# Patient Record
Sex: Male | Born: 1950 | Race: White | Hispanic: No | Marital: Married | Smoking: Former smoker
Health system: Southern US, Community
[De-identification: ages and names within clinical notes are randomized; demographics above are authoritative.]

## PROBLEM LIST (undated history)

## (undated) DIAGNOSIS — I10 Essential (primary) hypertension: Secondary | ICD-10-CM

## (undated) DIAGNOSIS — R569 Unspecified convulsions: Secondary | ICD-10-CM

## (undated) DIAGNOSIS — Z86010 Personal history of colonic polyps: Secondary | ICD-10-CM

## (undated) DIAGNOSIS — I4891 Unspecified atrial fibrillation: Secondary | ICD-10-CM

## (undated) DIAGNOSIS — F419 Anxiety disorder, unspecified: Secondary | ICD-10-CM

## (undated) DIAGNOSIS — S22000A Wedge compression fracture of unspecified thoracic vertebra, initial encounter for closed fracture: Secondary | ICD-10-CM

## (undated) DIAGNOSIS — I639 Cerebral infarction, unspecified: Secondary | ICD-10-CM

## (undated) DIAGNOSIS — A419 Sepsis, unspecified organism: Secondary | ICD-10-CM

## (undated) DIAGNOSIS — J189 Pneumonia, unspecified organism: Secondary | ICD-10-CM

## (undated) DIAGNOSIS — E119 Type 2 diabetes mellitus without complications: Secondary | ICD-10-CM

## (undated) DIAGNOSIS — Z9289 Personal history of other medical treatment: Secondary | ICD-10-CM

## (undated) DIAGNOSIS — N322 Vesical fistula, not elsewhere classified: Secondary | ICD-10-CM

## (undated) DIAGNOSIS — I5032 Chronic diastolic (congestive) heart failure: Secondary | ICD-10-CM

## (undated) DIAGNOSIS — N390 Urinary tract infection, site not specified: Secondary | ICD-10-CM

## (undated) DIAGNOSIS — C801 Malignant (primary) neoplasm, unspecified: Secondary | ICD-10-CM

## (undated) DIAGNOSIS — R351 Nocturia: Secondary | ICD-10-CM

## (undated) DIAGNOSIS — F10239 Alcohol dependence with withdrawal, unspecified: Secondary | ICD-10-CM

## (undated) DIAGNOSIS — D229 Melanocytic nevi, unspecified: Secondary | ICD-10-CM

## (undated) DIAGNOSIS — N189 Chronic kidney disease, unspecified: Secondary | ICD-10-CM

## (undated) HISTORY — DX: Personal history of other medical treatment: Z92.89

## (undated) HISTORY — DX: Personal history of colonic polyps: Z86.010

## (undated) HISTORY — DX: Pneumonia, unspecified organism: J18.9

## (undated) HISTORY — DX: Wedge compression fracture of unspecified thoracic vertebra, initial encounter for closed fracture: S22.000A

## (undated) HISTORY — DX: Unspecified convulsions: R56.9

## (undated) HISTORY — PX: OTHER SURGICAL HISTORY: SHX169

## (undated) HISTORY — DX: Alcohol dependence with withdrawal, unspecified: F10.239

## (undated) HISTORY — DX: Chronic diastolic (congestive) heart failure: I50.32

## (undated) HISTORY — PX: COLON SURGERY: SHX602

---
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
## 2005-02-08 ENCOUNTER — Emergency Department (HOSPITAL_COMMUNITY): Admission: EM | Admit: 2005-02-08 | Discharge: 2005-02-08 | Payer: Self-pay | Admitting: Emergency Medicine

## 2008-05-13 DIAGNOSIS — D229 Melanocytic nevi, unspecified: Secondary | ICD-10-CM

## 2008-05-13 HISTORY — DX: Melanocytic nevi, unspecified: D22.9

## 2010-08-20 DIAGNOSIS — C801 Malignant (primary) neoplasm, unspecified: Secondary | ICD-10-CM

## 2010-08-20 HISTORY — PX: LIPOMA EXCISION: SHX5283

## 2010-08-20 HISTORY — DX: Malignant (primary) neoplasm, unspecified: C80.1

## 2010-08-23 ENCOUNTER — Ambulatory Visit
Admission: RE | Admit: 2010-08-23 | Discharge: 2010-08-23 | Payer: Self-pay | Source: Home / Self Care | Attending: General Surgery | Admitting: General Surgery

## 2010-08-23 LAB — GLUCOSE, CAPILLARY
Glucose-Capillary: 143 mg/dL — ABNORMAL HIGH (ref 70–99)
Glucose-Capillary: 130 mg/dL — ABNORMAL HIGH (ref 70–99)

## 2010-10-30 LAB — BASIC METABOLIC PANEL
BUN: 9 mg/dL (ref 6–23)
CO2: 29 mEq/L (ref 19–32)
Calcium: 9.2 mg/dL (ref 8.4–10.5)
Chloride: 99 mEq/L (ref 96–112)
Creatinine, Ser: 0.85 mg/dL (ref 0.4–1.5)
GFR calc Af Amer: >60 mL/min (ref >60)
GFR calc non Af Amer: >60 mL/min (ref >60)
Glucose, Bld: 163 mg/dL — ABNORMAL HIGH (ref 70–99)
Potassium: 3.9 mEq/L (ref 3.5–5.1)
Sodium: 136 mEq/L (ref 135–145)

## 2010-10-30 LAB — DIFFERENTIAL
Basophils Absolute: 0.1 10*3/uL (ref 0.0–0.1)
Basophils Relative: 1 % (ref 0–1)
Eosinophils Absolute: 0.2 10*3/uL (ref 0.0–0.7)
Eosinophils Relative: 2 % (ref 0–5)
Lymphocytes Relative: 23 % (ref 12–46)
Lymphs Abs: 2.4 10*3/uL (ref 0.7–4.0)
Monocytes Absolute: 0.9 10*3/uL (ref 0.1–1.0)
Monocytes Relative: 9 % (ref 3–12)
Neutro Abs: 6.8 10*3/uL (ref 1.7–7.7)
Neutrophils Relative %: 66 % (ref 43–77)

## 2010-10-30 LAB — CBC
HCT: 58 % — ABNORMAL HIGH (ref 39.0–52.0)
Hemoglobin: 20.6 g/dL — ABNORMAL HIGH (ref 13.0–17.0)
MCH: 36.5 pg — ABNORMAL HIGH (ref 26.0–34.0)
MCHC: 35.5 g/dL (ref 30.0–36.0)
MCV: 102.8 fL — ABNORMAL HIGH (ref 78.0–100.0)
Platelets: 189 10*3/uL (ref 150–400)
RBC: 5.64 MIL/uL (ref 4.22–5.81)
RDW: 13 % (ref 11.5–15.5)
WBC: 10.4 10*3/uL (ref 4.0–10.5)

## 2010-12-28 ENCOUNTER — Ambulatory Visit: Payer: BC Managed Care – PPO | Attending: Radiation Oncology | Admitting: Radiation Oncology

## 2010-12-28 DIAGNOSIS — Z79899 Other long term (current) drug therapy: Secondary | ICD-10-CM | POA: Insufficient documentation

## 2010-12-28 DIAGNOSIS — C61 Malignant neoplasm of prostate: Secondary | ICD-10-CM | POA: Insufficient documentation

## 2010-12-28 DIAGNOSIS — E119 Type 2 diabetes mellitus without complications: Secondary | ICD-10-CM | POA: Insufficient documentation

## 2011-01-26 ENCOUNTER — Other Ambulatory Visit: Payer: Self-pay | Admitting: Urology

## 2011-01-26 ENCOUNTER — Encounter (HOSPITAL_COMMUNITY): Payer: BC Managed Care – PPO

## 2011-01-26 ENCOUNTER — Other Ambulatory Visit (HOSPITAL_COMMUNITY): Payer: Self-pay | Admitting: Urology

## 2011-01-26 ENCOUNTER — Ambulatory Visit (HOSPITAL_COMMUNITY)
Admission: RE | Admit: 2011-01-26 | Discharge: 2011-01-26 | Disposition: A | Discharge status: Home / Self Care | Payer: BC Managed Care – PPO | Source: Ambulatory Visit | Attending: Urology | Admitting: Urology

## 2011-01-26 DIAGNOSIS — I10 Essential (primary) hypertension: Secondary | ICD-10-CM

## 2011-01-26 DIAGNOSIS — Z01818 Encounter for other preprocedural examination: Secondary | ICD-10-CM | POA: Insufficient documentation

## 2011-01-26 DIAGNOSIS — Z01812 Encounter for preprocedural laboratory examination: Secondary | ICD-10-CM | POA: Insufficient documentation

## 2011-01-26 DIAGNOSIS — Z87891 Personal history of nicotine dependence: Secondary | ICD-10-CM | POA: Insufficient documentation

## 2011-01-26 DIAGNOSIS — C61 Malignant neoplasm of prostate: Secondary | ICD-10-CM

## 2011-01-26 LAB — CBC
HCT: 50.7 % (ref 39.0–52.0)
Hemoglobin: 18.6 g/dL — ABNORMAL HIGH (ref 13.0–17.0)
MCH: 36.2 pg — ABNORMAL HIGH (ref 26.0–34.0)
MCHC: 36.7 g/dL — ABNORMAL HIGH (ref 30.0–36.0)
MCV: 98.6 fL (ref 78.0–100.0)
Platelets: 228 10*3/uL (ref 150–400)
RBC: 5.14 MIL/uL (ref 4.22–5.81)
RDW: 11.6 % (ref 11.5–15.5)
WBC: 8.6 10*3/uL (ref 4.0–10.5)

## 2011-01-26 LAB — BASIC METABOLIC PANEL
BUN: 6 mg/dL (ref 6–23)
CO2: 28 mEq/L (ref 19–32)
Calcium: 9.3 mg/dL (ref 8.4–10.5)
Chloride: 89 mEq/L — ABNORMAL LOW (ref 96–112)
Creatinine, Ser: 0.68 mg/dL (ref 0.4–1.5)
GFR calc Af Amer: >60 mL/min (ref >60)
GFR calc non Af Amer: >60 mL/min (ref >60)
Glucose, Bld: 145 mg/dL — ABNORMAL HIGH (ref 70–99)
Potassium: 5.1 mEq/L (ref 3.5–5.1)
Sodium: 126 mEq/L — ABNORMAL LOW (ref 135–145)

## 2011-01-26 LAB — SURGICAL PCR SCREEN
MRSA, PCR: NEGATIVE
Staphylococcus aureus: NEGATIVE

## 2011-01-31 ENCOUNTER — Other Ambulatory Visit: Payer: Self-pay | Admitting: Urology

## 2011-01-31 ENCOUNTER — Inpatient Hospital Stay (HOSPITAL_COMMUNITY)
Admission: RE | Admit: 2011-01-31 | Discharge: 2011-02-01 | DRG: 335 | Disposition: A | Discharge status: Home / Self Care | Payer: BC Managed Care – PPO | Source: Ambulatory Visit | Attending: Urology | Admitting: Urology

## 2011-01-31 DIAGNOSIS — C61 Malignant neoplasm of prostate: Principal | ICD-10-CM | POA: Diagnosis present

## 2011-01-31 DIAGNOSIS — E119 Type 2 diabetes mellitus without complications: Secondary | ICD-10-CM | POA: Diagnosis present

## 2011-01-31 DIAGNOSIS — I10 Essential (primary) hypertension: Secondary | ICD-10-CM | POA: Diagnosis present

## 2011-01-31 DIAGNOSIS — G8918 Other acute postprocedural pain: Secondary | ICD-10-CM | POA: Diagnosis not present

## 2011-01-31 DIAGNOSIS — Z01812 Encounter for preprocedural laboratory examination: Secondary | ICD-10-CM

## 2011-01-31 DIAGNOSIS — F17200 Nicotine dependence, unspecified, uncomplicated: Secondary | ICD-10-CM | POA: Diagnosis present

## 2011-01-31 HISTORY — PX: ROBOT ASSISTED LAPAROSCOPIC RADICAL PROSTATECTOMY: SHX5141

## 2011-01-31 LAB — ABO/RH: ABO/RH(D): O POS

## 2011-01-31 LAB — BASIC METABOLIC PANEL
BUN: 7 mg/dL (ref 6–23)
CO2: 24 mEq/L (ref 19–32)
Calcium: 8.2 mg/dL — ABNORMAL LOW (ref 8.4–10.5)
Chloride: 100 mEq/L (ref 96–112)
Creatinine, Ser: 0.75 mg/dL (ref 0.4–1.5)
GFR calc Af Amer: >60 mL/min (ref >60)
GFR calc non Af Amer: >60 mL/min (ref >60)
Glucose, Bld: 162 mg/dL — ABNORMAL HIGH (ref 70–99)
Potassium: 4.3 mEq/L (ref 3.5–5.1)
Sodium: 133 mEq/L — ABNORMAL LOW (ref 135–145)

## 2011-01-31 LAB — TYPE AND SCREEN
ABO/RH(D): O POS
Antibody Screen: NEGATIVE

## 2011-01-31 LAB — GLUCOSE, CAPILLARY
Glucose-Capillary: 149 mg/dL — ABNORMAL HIGH (ref 70–99)
Glucose-Capillary: 174 mg/dL — ABNORMAL HIGH (ref 70–99)
Glucose-Capillary: 198 mg/dL — ABNORMAL HIGH (ref 70–99)
Glucose-Capillary: 177 mg/dL — ABNORMAL HIGH (ref 70–99)

## 2011-01-31 LAB — CBC
HCT: 45.3 % (ref 39.0–52.0)
Hemoglobin: 15.9 g/dL (ref 13.0–17.0)
MCH: 35.3 pg — ABNORMAL HIGH (ref 26.0–34.0)
MCHC: 35.1 g/dL (ref 30.0–36.0)
MCV: 100.4 fL — ABNORMAL HIGH (ref 78.0–100.0)
Platelets: 206 10*3/uL (ref 150–400)
RBC: 4.51 MIL/uL (ref 4.22–5.81)
RDW: 11.5 % (ref 11.5–15.5)
WBC: 18.7 10*3/uL — ABNORMAL HIGH (ref 4.0–10.5)

## 2011-02-01 LAB — BASIC METABOLIC PANEL
BUN: 6 mg/dL (ref 6–23)
CO2: 28 mEq/L (ref 19–32)
Calcium: 7.7 mg/dL — ABNORMAL LOW (ref 8.4–10.5)
Chloride: 102 mEq/L (ref 96–112)
Creatinine, Ser: 0.67 mg/dL (ref 0.4–1.5)
GFR calc Af Amer: >60 mL/min (ref >60)
GFR calc non Af Amer: >60 mL/min (ref >60)
Glucose, Bld: 124 mg/dL — ABNORMAL HIGH (ref 70–99)
Potassium: 4.8 mEq/L (ref 3.5–5.1)
Sodium: 134 mEq/L — ABNORMAL LOW (ref 135–145)

## 2011-02-01 LAB — DIFFERENTIAL
Basophils Absolute: 0 10*3/uL (ref 0.0–0.1)
Basophils Relative: 0 % (ref 0–1)
Eosinophils Absolute: 0.1 10*3/uL (ref 0.0–0.7)
Eosinophils Relative: 1 % (ref 0–5)
Lymphocytes Relative: 17 % (ref 12–46)
Lymphs Abs: 2 10*3/uL (ref 0.7–4.0)
Monocytes Absolute: 1.1 10*3/uL — ABNORMAL HIGH (ref 0.1–1.0)
Monocytes Relative: 9 % (ref 3–12)
Neutro Abs: 8.5 10*3/uL — ABNORMAL HIGH (ref 1.7–7.7)
Neutrophils Relative %: 73 % (ref 43–77)

## 2011-02-01 LAB — GLUCOSE, CAPILLARY
Glucose-Capillary: 100 mg/dL — ABNORMAL HIGH (ref 70–99)
Glucose-Capillary: 132 mg/dL — ABNORMAL HIGH (ref 70–99)
Glucose-Capillary: 162 mg/dL — ABNORMAL HIGH (ref 70–99)
Glucose-Capillary: 199 mg/dL — ABNORMAL HIGH (ref 70–99)

## 2011-02-01 LAB — CBC
HCT: 40.1 % (ref 39.0–52.0)
Hemoglobin: 13.8 g/dL (ref 13.0–17.0)
MCH: 35.2 pg — ABNORMAL HIGH (ref 26.0–34.0)
MCHC: 34.4 g/dL (ref 30.0–36.0)
MCV: 102.3 fL — ABNORMAL HIGH (ref 78.0–100.0)
Platelets: 194 10*3/uL (ref 150–400)
RBC: 3.92 MIL/uL — ABNORMAL LOW (ref 4.22–5.81)
RDW: 11.6 % (ref 11.5–15.5)
WBC: 11.7 10*3/uL — ABNORMAL HIGH (ref 4.0–10.5)

## 2011-03-01 NOTE — Op Note (Signed)
NAME:  Douglas Ballard, POSA NO.:  [id number]  MEDICAL RECORD NO.:  [id number]  LOCATION:  [id number]                         FACILITY:  [facility]  PHYSICIAN:  Bernestine Amass, M.D.  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  PROCEDURE PERFORMED:  Robotic-assisted laparoscopic radical retropubic prostatectomy with bilateral pelvic lymph node dissections.  SURGEON:  Bernestine Amass, M.D.  ASSISTANT:  Franco Collet, RN  ANESTHESIA:  General endotracheal.  INDICATIONS:  Douglas Ballard is 60 years of age.  The patient was sent to see me with a mildly elevated PSA of 4.6.  The patient had an ultrasound, which revealed nothing of concern and digital rectal exam was normal.  Prostate was 20 g.  The patient's right-sided biopsies were positive in 5/6 cores for moderate volume Gleason 3+4=7 cancer.  All left-sided biopsies were negative.  The patient underwent extensive counseling with regard to treatment options and chose robotic surgery. He appeared to understand the advantages, disadvantages, potential risk of the surgery.  He was felt to be primarily a candidate for unilateral nerve spare.  The patient has met with Physical Therapy preoperatively. He has done a mechanical bowel prep.  He has had placement of PAS compression boots and has received perioperative Unasyn.  TECHNIQUE AND FINDINGS:  The patient was brought to the operating room where he had successful induction of general endotracheal anesthesia. He was placed in the mid-to-low lithotomy position and carefully secured to the operative table.  All extremities were carefully padded.  The patient was then placed in a steep Trendelenburg  position and prepped and draped in the usual manner.  Appropriate surgical time-out was performed.  A Foley catheter was placed sterilely on the operative field.  Initial camera port incision was chosen 18 cm above the pubic symphysis just left of the umbilicus.  A standard open Hasson technique was utilized and placement of the initial 12 mm trocar and abdominal insufflation occurred without incident.  Additional trocars were placed with direct visual guidance including additional 5 and 12 mm assist ports and three 8 mm robotic trocars.  Once all trocars were placed, the surgical cart was docked.  The bladder was filled and the space of Retzius was developed with blunt and sharp dissection technique.  A small amount of superficial fat over the endopelvic fascia and bladder neck was taken with electrocautery scissors.  The endopelvic fascia was then opened widely from base to apex of the prostate and the levator musculature swept off the apex of the prostate isolating the dorsal venous complex, which was then stapled with an ETS stapling device with excellent hemostasis.  A Foley balloon catheter was used to identify the anterior bladder neck, which was then transected down to catheter with electrocautery scissors. The catheter was then retracted anteriorly.  Indigo carmine was given and we were well away from the ureteral orifices.  The bladder neck incision was then carried down to the posterior portion  of the bladder. This was then carried down to the vas deferens and seminal vesicles, which were identified individually and freed.  With anterior retraction, the posterior plane between the rectum and the prostate was easily established primarily with blunt dissection technique.  Again, the patient was felt to be a candidate only for unilateral nerve spare, given his pathology.  The patient's tumor was all located on the right where we chose to do a wide excision of his bundle.  On  the left side, superficial fascia anteriorly was loosely transected and then swept posterolaterally until we were able to establish a nice groove between the neurovascular bundle on the lateral aspect of the prostate. The screw was then further established.  Pedicles of the prostate were taken down and again on the right side, the EnSeal device was used widely to excise as much tissue as possible along the posterolateral aspect of the prostate.  The anterior urethra was then transected and the catheter was retracted.  Posterior urethra was then transected and once some rectourethralis muscle was sharply incised, the prostate specimen was removed from the hemipelvis.  Irrigation was then performed and rectal insufflation revealed no evidence of rectal injury.  Hemostasis remained excellent.  Attention was then turned toward bilateral pelvic lymph node dissections. Obturator nodes extending to the bifurcation of the iliac artery were taken bilaterally.  Clips were used for small venous channels and lymphatics.  Obturator nerves identified bilaterally and node packet sent separately for permanent analysis only.  Attention was then turned to reconstruction.  The bladder neck was small and required no closure.  Indigo carmine was given and we were again well away from the ureteral orifices.  The posterior urethral stump and posterior bladder neck were reapproximated with an interrupted Vicryl suture at the 6 o'clock position.  The rest of the anastomosis was done with a double-armed 3-0 Monocryl suture in a 360-degree manner.  A new catheter was placed without difficulty and irrigation revealed no evidence of any leakage.  A pelvic drain was placed through one of the robotic trocars and secured to the skin.  The prostate specimen was placed in an Endopouch retrieval bag.  The 12 mm trocar site was closed with a Vicryl suture with the aid of a suture passer and all other trocars were taken  out with direct visual guidance.  The camera port incision was extended slightly to allow removal of the specimen and then closed with a running Vicryl suture.  All port sites and incisions were infiltrated with Marcaine and closed with clips.  The bladder continued to irrigate blue urine from the indigo carmine.  The patient's blood loss was 50 cc.  The patient had no obvious complications or problems and was brought to recovery room in stable condition.     Bernestine Amass, M.D.     DSG/MEDQ  D:  02/01/2011  T:  02/01/2011  Job:  [id number]  Electronically Signed by Rana Snare M.D. on 03/01/2011 05:44:08 PM

## 2011-06-22 ENCOUNTER — Ambulatory Visit
Admission: RE | Admit: 2011-06-22 | Discharge: 2011-06-22 | Disposition: A | Discharge status: Home / Self Care | Payer: BC Managed Care – PPO | Source: Ambulatory Visit | Attending: Family Medicine | Admitting: Family Medicine

## 2011-06-22 ENCOUNTER — Other Ambulatory Visit: Payer: Self-pay | Admitting: Family Medicine

## 2011-06-26 ENCOUNTER — Other Ambulatory Visit: Payer: Self-pay

## 2011-06-26 ENCOUNTER — Emergency Department (HOSPITAL_COMMUNITY): Payer: BC Managed Care – PPO

## 2011-06-26 ENCOUNTER — Inpatient Hospital Stay (HOSPITAL_COMMUNITY)
Admission: EM | Admit: 2011-06-26 | Discharge: 2011-07-02 | DRG: 560 | Disposition: A | Discharge status: Home / Self Care | Payer: BC Managed Care – PPO | Attending: Internal Medicine | Admitting: Internal Medicine

## 2011-06-26 DIAGNOSIS — E1159 Type 2 diabetes mellitus with other circulatory complications: Secondary | ICD-10-CM | POA: Diagnosis present

## 2011-06-26 DIAGNOSIS — Y92009 Unspecified place in unspecified non-institutional (private) residence as the place of occurrence of the external cause: Secondary | ICD-10-CM

## 2011-06-26 DIAGNOSIS — R52 Pain, unspecified: Secondary | ICD-10-CM | POA: Diagnosis present

## 2011-06-26 DIAGNOSIS — J189 Pneumonia, unspecified organism: Secondary | ICD-10-CM

## 2011-06-26 DIAGNOSIS — R569 Unspecified convulsions: Secondary | ICD-10-CM | POA: Diagnosis present

## 2011-06-26 DIAGNOSIS — E871 Hypo-osmolality and hyponatremia: Secondary | ICD-10-CM | POA: Diagnosis present

## 2011-06-26 DIAGNOSIS — I10 Essential (primary) hypertension: Secondary | ICD-10-CM

## 2011-06-26 DIAGNOSIS — F1010 Alcohol abuse, uncomplicated: Secondary | ICD-10-CM

## 2011-06-26 DIAGNOSIS — S22000A Wedge compression fracture of unspecified thoracic vertebra, initial encounter for closed fracture: Secondary | ICD-10-CM

## 2011-06-26 DIAGNOSIS — F10231 Alcohol dependence with withdrawal delirium: Secondary | ICD-10-CM | POA: Diagnosis not present

## 2011-06-26 DIAGNOSIS — D72829 Elevated white blood cell count, unspecified: Secondary | ICD-10-CM

## 2011-06-26 DIAGNOSIS — Y998 Other external cause status: Secondary | ICD-10-CM

## 2011-06-26 DIAGNOSIS — S22009A Unspecified fracture of unspecified thoracic vertebra, initial encounter for closed fracture: Principal | ICD-10-CM | POA: Diagnosis present

## 2011-06-26 DIAGNOSIS — F17200 Nicotine dependence, unspecified, uncomplicated: Secondary | ICD-10-CM | POA: Diagnosis present

## 2011-06-26 DIAGNOSIS — Z720 Tobacco use: Secondary | ICD-10-CM

## 2011-06-26 DIAGNOSIS — F10939 Alcohol use, unspecified with withdrawal, unspecified: Secondary | ICD-10-CM

## 2011-06-26 DIAGNOSIS — W19XXXA Unspecified fall, initial encounter: Secondary | ICD-10-CM | POA: Diagnosis present

## 2011-06-26 DIAGNOSIS — F1020 Alcohol dependence, uncomplicated: Secondary | ICD-10-CM | POA: Diagnosis present

## 2011-06-26 DIAGNOSIS — F10931 Alcohol use, unspecified with withdrawal delirium: Secondary | ICD-10-CM | POA: Diagnosis not present

## 2011-06-26 DIAGNOSIS — Z8546 Personal history of malignant neoplasm of prostate: Secondary | ICD-10-CM

## 2011-06-26 DIAGNOSIS — F10239 Alcohol dependence with withdrawal, unspecified: Secondary | ICD-10-CM

## 2011-06-26 HISTORY — DX: Essential (primary) hypertension: I10

## 2011-06-26 HISTORY — DX: Wedge compression fracture of unspecified thoracic vertebra, initial encounter for closed fracture: S22.000A

## 2011-06-26 HISTORY — DX: Malignant (primary) neoplasm, unspecified: C80.1

## 2011-06-26 HISTORY — DX: Pneumonia, unspecified organism: J18.9

## 2011-06-26 LAB — POCT I-STAT, CHEM 8
BUN: 17 mg/dL (ref 6–23)
Calcium, Ion: 1.03 mmol/L — ABNORMAL LOW (ref 1.12–1.32)
Chloride: 88 mEq/L — ABNORMAL LOW (ref 96–112)
Creatinine, Ser: 1.1 mg/dL (ref 0.50–1.35)
Glucose, Bld: 213 mg/dL — ABNORMAL HIGH (ref 70–99)
HCT: 41 % (ref 39.0–52.0)
Hemoglobin: 13.9 g/dL (ref 13.0–17.0)
Potassium: 4.1 mEq/L (ref 3.5–5.1)
Sodium: 121 mEq/L — ABNORMAL LOW (ref 135–145)
TCO2: 23 mmol/L (ref 0–100)

## 2011-06-26 LAB — ETHANOL: Alcohol, Ethyl (B): <11 mg/dL (ref 0–11)

## 2011-06-26 LAB — GLUCOSE, CAPILLARY: Glucose-Capillary: 154 mg/dL — ABNORMAL HIGH (ref 70–99)

## 2011-06-26 LAB — CBC
Hemoglobin: 13.9 g/dL (ref 13.0–17.0)
MCH: 33.8 pg (ref 26.0–34.0)
MCV: 91 fL (ref 78.0–100.0)
RBC: 4.11 MIL/uL — ABNORMAL LOW (ref 4.22–5.81)

## 2011-06-26 LAB — HEMOGLOBIN A1C: Hgb A1c MFr Bld: 6.8 % — ABNORMAL HIGH (ref <5.7)

## 2011-06-26 LAB — PROTIME-INR
INR: 1.04 (ref 0.00–1.49)
Prothrombin Time: 13.8 seconds (ref 11.6–15.2)

## 2011-06-26 MED ORDER — HYDROMORPHONE HCL PF 1 MG/ML IJ SOLN
1.0000 mg | Freq: Once | INTRAMUSCULAR | Status: AC
  Administered 2011-06-26: 1 mg via INTRAVENOUS

## 2011-06-26 MED ORDER — HYDROMORPHONE HCL PF 1 MG/ML IJ SOLN
2.0000 mg | INTRAMUSCULAR | Status: DC | PRN
  Administered 2011-06-26: 2 mg via INTRAVENOUS

## 2011-06-26 MED ORDER — FOLIC ACID 1 MG PO TABS
1.0000 mg | ORAL_TABLET | Freq: Every day | ORAL | Status: DC
  Administered 2011-06-27 – 2011-07-02 (×6): 1 mg via ORAL
  Filled 2011-06-26 (×8): qty 1

## 2011-06-26 MED ORDER — DIPHENHYDRAMINE HCL 50 MG/ML IJ SOLN
12.5000 mg | Freq: Four times a day (QID) | INTRAMUSCULAR | Status: DC | PRN
  Administered 2011-06-26 – 2011-06-27 (×2): 12.5 mg via INTRAVENOUS
  Filled 2011-06-26 (×3): qty 1

## 2011-06-26 MED ORDER — NALOXONE HCL 0.4 MG/ML IJ SOLN: 0.4000 mg | INTRAMUSCULAR | Status: DC | PRN

## 2011-06-26 MED ORDER — OMEGA-3-ACID ETHYL ESTERS 1 G PO CAPS
1.0000 g | ORAL_CAPSULE | Freq: Every day | ORAL | Status: DC
  Administered 2011-06-27 – 2011-07-02 (×6): 1 g via ORAL
  Filled 2011-06-26 (×8): qty 1

## 2011-06-26 MED ORDER — SODIUM CHLORIDE 0.9 % IV SOLN
INTRAVENOUS | Status: DC
  Administered 2011-06-27 – 2011-06-28 (×4): via INTRAVENOUS

## 2011-06-26 MED ORDER — HYDROMORPHONE HCL PF 2 MG/ML IJ SOLN: 2.0000 mg | Freq: Once | INTRAMUSCULAR | Status: DC

## 2011-06-26 MED ORDER — ONDANSETRON HCL 4 MG PO TABS: 4.0000 mg | ORAL_TABLET | Freq: Four times a day (QID) | ORAL | Status: DC | PRN

## 2011-06-26 MED ORDER — HYDROMORPHONE HCL PF 1 MG/ML IJ SOLN
1.0000 mg | INTRAMUSCULAR | Status: DC | PRN
  Administered 2011-06-26: 1 mg via INTRAVENOUS
  Filled 2011-06-26: qty 1

## 2011-06-26 MED ORDER — VITAMIN B-1 100 MG PO TABS
100.0000 mg | ORAL_TABLET | Freq: Every day | ORAL | Status: DC
  Administered 2011-06-27 – 2011-07-02 (×6): 100 mg via ORAL
  Filled 2011-06-26 (×8): qty 1

## 2011-06-26 MED ORDER — THERA M PLUS PO TABS
1.0000 | ORAL_TABLET | Freq: Every day | ORAL | Status: DC
  Administered 2011-06-27 – 2011-07-02 (×6): 1 via ORAL
  Filled 2011-06-26 (×8): qty 1

## 2011-06-26 MED ORDER — POLYETHYLENE GLYCOL 3350 17 G PO PACK
17.0000 g | PACK | Freq: Every day | ORAL | Status: DC
  Administered 2011-06-27 – 2011-07-02 (×6): 17 g via ORAL
  Filled 2011-06-26 (×8): qty 1

## 2011-06-26 MED ORDER — HYDROMORPHONE HCL PF 1 MG/ML IJ SOLN
INTRAMUSCULAR | Status: AC
  Filled 2011-06-26: qty 1

## 2011-06-26 MED ORDER — THIAMINE HCL 100 MG/ML IJ SOLN
100.0000 mg | Freq: Once | INTRAMUSCULAR | Status: AC
  Administered 2011-06-26: 100 mg via INTRAVENOUS
  Filled 2011-06-26: qty 2

## 2011-06-26 MED ORDER — DEXTROSE 5 % IV SOLN
1.0000 g | Freq: Once | INTRAVENOUS | Status: DC
  Filled 2011-06-26: qty 10

## 2011-06-26 MED ORDER — HYDROMORPHONE HCL PF 1 MG/ML IJ SOLN
INTRAMUSCULAR | Status: AC
  Administered 2011-06-26: 1 mg
  Filled 2011-06-26: qty 1

## 2011-06-26 MED ORDER — AMLODIPINE BESYLATE 5 MG PO TABS
5.0000 mg | ORAL_TABLET | Freq: Every day | ORAL | Status: DC
  Administered 2011-06-26 – 2011-06-28 (×3): 5 mg via ORAL
  Filled 2011-06-26 (×4): qty 1

## 2011-06-26 MED ORDER — ONDANSETRON HCL 4 MG/2ML IJ SOLN
INTRAMUSCULAR | Status: AC
  Filled 2011-06-26: qty 2

## 2011-06-26 MED ORDER — HYDROMORPHONE 0.3 MG/ML IV SOLN
INTRAVENOUS | Status: DC
  Administered 2011-06-26 – 2011-06-27 (×2): 7.5 mg via INTRAVENOUS
  Filled 2011-06-26 (×2): qty 25

## 2011-06-26 MED ORDER — ZOLPIDEM TARTRATE 5 MG PO TABS
10.0000 mg | ORAL_TABLET | Freq: Every evening | ORAL | Status: DC | PRN
  Administered 2011-06-26 – 2011-06-28 (×3): 10 mg via ORAL
  Filled 2011-06-26 (×3): qty 2

## 2011-06-26 MED ORDER — ONDANSETRON HCL 4 MG/2ML IJ SOLN: 4.0000 mg | Freq: Three times a day (TID) | INTRAMUSCULAR | Status: DC | PRN

## 2011-06-26 MED ORDER — SODIUM CHLORIDE 0.9 % IJ SOLN: 9.0000 mL | INTRAMUSCULAR | Status: DC | PRN

## 2011-06-26 MED ORDER — SODIUM CHLORIDE 0.9 % IV BOLUS (SEPSIS)
1000.0000 mL | Freq: Once | INTRAVENOUS | Status: AC
  Administered 2011-06-26: 1000 mL via INTRAVENOUS

## 2011-06-26 MED ORDER — ONDANSETRON HCL 4 MG/2ML IJ SOLN
4.0000 mg | Freq: Once | INTRAMUSCULAR | Status: AC
  Administered 2011-06-26: 4 mg via INTRAVENOUS

## 2011-06-26 MED ORDER — METHOCARBAMOL 100 MG/ML IJ SOLN
500.0000 mg | Freq: Four times a day (QID) | INTRAVENOUS | Status: DC | PRN
  Administered 2011-06-26 – 2011-06-27 (×2): 500 mg via INTRAVENOUS
  Filled 2011-06-26 (×5): qty 5

## 2011-06-26 MED ORDER — SODIUM CHLORIDE 0.9 % IV SOLN
INTRAVENOUS | Status: AC
  Administered 2011-06-26 (×2): via INTRAVENOUS

## 2011-06-26 MED ORDER — INSULIN ASPART 100 UNIT/ML ~~LOC~~ SOLN
0.0000 [IU] | Freq: Three times a day (TID) | SUBCUTANEOUS | Status: DC
  Administered 2011-06-27: 2 [IU] via SUBCUTANEOUS
  Administered 2011-06-27: 1 [IU] via SUBCUTANEOUS
  Administered 2011-06-27 – 2011-06-28 (×4): 2 [IU] via SUBCUTANEOUS
  Administered 2011-06-29: 1 [IU] via SUBCUTANEOUS
  Administered 2011-06-29 – 2011-06-30 (×2): 3 [IU] via SUBCUTANEOUS
  Administered 2011-06-30: 1 [IU] via SUBCUTANEOUS
  Administered 2011-07-01 – 2011-07-02 (×3): 2 [IU] via SUBCUTANEOUS
  Filled 2011-06-26 (×2): qty 3

## 2011-06-26 MED ORDER — ACETAMINOPHEN 650 MG RE SUPP: 650.0000 mg | Freq: Four times a day (QID) | RECTAL | Status: DC | PRN

## 2011-06-26 MED ORDER — DEXTROSE 5 % IV SOLN
1.0000 g | INTRAVENOUS | Status: DC
  Administered 2011-06-26 – 2011-06-28 (×3): 1 g via INTRAVENOUS
  Filled 2011-06-26 (×4): qty 10

## 2011-06-26 MED ORDER — OXYCODONE HCL 5 MG PO TABS
5.0000 mg | ORAL_TABLET | ORAL | Status: DC | PRN
  Administered 2011-06-26 – 2011-06-29 (×8): 5 mg via ORAL
  Filled 2011-06-26 (×8): qty 1

## 2011-06-26 MED ORDER — DEXTROSE 5 % IV SOLN
500.0000 mg | Freq: Once | INTRAVENOUS | Status: AC
  Administered 2011-06-26: 500 mg via INTRAVENOUS
  Filled 2011-06-26: qty 500

## 2011-06-26 MED ORDER — ACETAMINOPHEN 325 MG PO TABS
650.0000 mg | ORAL_TABLET | Freq: Four times a day (QID) | ORAL | Status: DC | PRN
  Administered 2011-06-29: 650 mg via ORAL
  Filled 2011-06-26 (×2): qty 1

## 2011-06-26 MED ORDER — FENTANYL CITRATE 0.05 MG/ML IJ SOLN
INTRAMUSCULAR | Status: AC
  Filled 2011-06-26: qty 2

## 2011-06-26 MED ORDER — ONDANSETRON HCL 4 MG/2ML IJ SOLN
4.0000 mg | Freq: Four times a day (QID) | INTRAMUSCULAR | Status: DC | PRN
  Administered 2011-06-26 – 2011-06-28 (×2): 4 mg via INTRAVENOUS
  Filled 2011-06-26 (×2): qty 2

## 2011-06-26 MED ORDER — HYDROMORPHONE HCL PF 2 MG/ML IJ SOLN
2.0000 mg | Freq: Once | INTRAMUSCULAR | Status: AC
  Administered 2011-06-26: 2 mg via INTRAVENOUS
  Filled 2011-06-26: qty 1

## 2011-06-26 MED ORDER — AZITHROMYCIN 500 MG IV SOLR
500.0000 mg | INTRAVENOUS | Status: DC
  Administered 2011-06-26 – 2011-06-29 (×4): 500 mg via INTRAVENOUS
  Filled 2011-06-26 (×5): qty 500

## 2011-06-26 MED ORDER — NICOTINE 14 MG/24HR TD PT24
14.0000 mg | MEDICATED_PATCH | Freq: Every day | TRANSDERMAL | Status: DC
  Administered 2011-06-26 – 2011-07-02 (×7): 14 mg via TRANSDERMAL
  Filled 2011-06-26 (×7): qty 1

## 2011-06-26 MED ORDER — DEXTROSE 5 % IV SOLN
INTRAVENOUS | Status: AC
  Filled 2011-06-26: qty 250

## 2011-06-26 MED ORDER — HYDROMORPHONE HCL PF 1 MG/ML IJ SOLN
1.0000 mg | Freq: Once | INTRAMUSCULAR | Status: AC
  Administered 2011-06-26: 1 mg via INTRAVENOUS
  Filled 2011-06-26: qty 1

## 2011-06-26 MED ORDER — AZITHROMYCIN 500 MG IV SOLR
INTRAVENOUS | Status: AC
  Filled 2011-06-26: qty 500

## 2011-06-26 MED ORDER — OLMESARTAN MEDOXOMIL 40 MG PO TABS
40.0000 mg | ORAL_TABLET | Freq: Every day | ORAL | Status: DC
  Administered 2011-06-26 – 2011-07-02 (×7): 40 mg via ORAL
  Filled 2011-06-26 (×7): qty 1

## 2011-06-26 MED ORDER — HYDROMORPHONE HCL PF 2 MG/ML IJ SOLN
2.0000 mg | INTRAMUSCULAR | Status: DC | PRN
  Filled 2011-06-26: qty 2

## 2011-06-26 MED ORDER — HYDROMORPHONE HCL PF 1 MG/ML IJ SOLN: 1.0000 mg | INTRAMUSCULAR | Status: DC | PRN

## 2011-06-26 MED ORDER — DIPHENHYDRAMINE HCL 12.5 MG/5ML PO ELIX
12.5000 mg | ORAL_SOLUTION | Freq: Four times a day (QID) | ORAL | Status: DC | PRN
  Administered 2011-06-27: 12.5 mg via ORAL
  Filled 2011-06-26: qty 5

## 2011-06-26 MED ORDER — DEXTROSE 5 % IV SOLN
1.0000 g | INTRAVENOUS | Status: DC
  Filled 2011-06-26: qty 10

## 2011-06-26 MED ORDER — TIZANIDINE HCL 4 MG PO TABS
4.0000 mg | ORAL_TABLET | Freq: Four times a day (QID) | ORAL | Status: DC | PRN
  Administered 2011-06-26: 4 mg via ORAL
  Filled 2011-06-26: qty 1

## 2011-06-26 MED ORDER — DEXTROSE 5 % IV SOLN: 1.0000 g | INTRAVENOUS | Status: DC

## 2011-06-26 MED ORDER — PNEUMOCOCCAL VAC POLYVALENT 25 MCG/0.5ML IJ INJ
0.5000 mL | INJECTION | INTRAMUSCULAR | Status: AC
  Administered 2011-06-27: 0.5 mL via INTRAMUSCULAR
  Filled 2011-06-26: qty 0.5

## 2011-06-26 MED ORDER — SODIUM CHLORIDE 0.9 % IV SOLN
1.0000 mg/h | INTRAVENOUS | Status: DC
  Filled 2011-06-26: qty 5

## 2011-06-26 MED ORDER — IOHEXOL 300 MG/ML  SOLN: 100.0000 mL | Freq: Once | INTRAMUSCULAR | Status: DC | PRN

## 2011-06-26 MED ORDER — INFLUENZA VIRUS VACC SPLIT PF IM SUSP
0.5000 mL | Freq: Once | INTRAMUSCULAR | Status: AC
  Administered 2011-06-27: 0.5 mL via INTRAMUSCULAR
  Filled 2011-06-26: qty 0.5

## 2011-06-26 MED ORDER — FENTANYL CITRATE 0.05 MG/ML IJ SOLN
100.0000 ug | Freq: Once | INTRAMUSCULAR | Status: AC
  Administered 2011-06-26: 100 ug via INTRAVENOUS

## 2011-06-26 NOTE — ED Provider Notes (Signed)
History     CSN: [id number] Arrival date & time: 06/26/2011  8:26 AM   First MD Initiated Contact with Patient 06/26/11 [phone]      Chief Complaint  Patient presents with  . Back Pain    back pain batween shoulder blades, began early this am, pt was not acting right, possibly altered    (Consider location/radiation/quality/duration/timing/severity/associated sxs/prior treatment) Patient is a 60 y.o. male presenting with back pain. The history is provided by the patient and a relative.  Back Pain  This is a new problem. The current episode started 3 to 5 hours ago. The problem occurs constantly. The problem has not changed since onset.The pain is associated with falling (family found moaning on floor, unclear if fall). The quality of the pain is described as stabbing. The pain does not radiate. The pain is at a severity of 10/10. The pain is severe. The symptoms are aggravated by bending. The pain is the same all the time. Pertinent negatives include no chest pain and no abdominal pain. He has tried nothing for the symptoms.  Pt states pain is between his shoulder blades and is 10/10 in intensity. He is confused about events of the morning but family found him moaning and confused and called 911. The back pain was noticed once paramedics were called. Per pt's family, he underwent a prostatectomy in the last year for prostate cancer and has not received any other treatment as margins were clear. Pt denies any new or recent symptoms and states that he was feeling his usual state of health this morning.   Past Medical History  Diagnosis Date  . Hypertension     History reviewed. No pertinent past surgical history.  History reviewed. No pertinent family history.  History  Substance Use Topics  . Smoking status: Current Everyday Smoker    Types: Cigarettes  . Smokeless tobacco: Not on file  . Alcohol Use: Yes     vodka dailey      Review of Systems  Constitutional: Positive for  activity change. Negative for diaphoresis, appetite change, fatigue and unexpected weight change.  Respiratory: Negative for cough, choking, chest tightness, shortness of breath, wheezing and stridor.   Cardiovascular: Negative for chest pain and palpitations.  Gastrointestinal: Negative for abdominal pain and abdominal distention.  Musculoskeletal: Positive for back pain and gait problem.       Pt fell several times this morning.   Psychiatric/Behavioral: Positive for confusion.       Pt is unable to tell the full story of what happened this morning and does not remember what happened.  All other systems reviewed and are negative.    Allergies  Sulfa antibiotics  Home Medications   Current Outpatient Rx  Name Route Sig Dispense Refill  . CIPROFLOXACIN HCL 250 MG PO TABS Oral Take 250 mg by mouth 2 (two) times daily.      Creed Copper M PLUS PO TABS Oral Take 1 tablet by mouth daily.      Marland Kitchen OLMESARTAN-AMLODIPINE-HCTZ 40-5-12.5 MG PO TABS Oral Take 1 tablet by mouth every other day. Patient alternates with Tribenzor 40-5-25mg  tab every other day.    Marland Kitchen OLMESARTAN-AMLODIPINE-HCTZ 40-5-25 MG PO TABS Oral Take 1 tablet by mouth every other day. Patient alternates with Tribenzor 40-5-12.5mg  tab every other day.     Marland Kitchen FISH OIL CONCENTRATE PO Oral Take 1 capsule by mouth daily.      Marland Kitchen PROMETHAZINE HCL 25 MG PO TABS Oral Take 25 mg by mouth  every 6 (six) hours as needed. For nausea     . TIZANIDINE HCL 4 MG PO TABS Oral Take 4 mg by mouth every 6 (six) hours as needed. For headache     . ZOLPIDEM TARTRATE 10 MG PO TABS Oral Take 10 mg by mouth at bedtime as needed. For sleep       BP 175/80  Pulse 113  Temp(Src) 98.5 F (36.9 C) (Oral)  Resp 20  SpO2 94%  Physical Exam  Constitutional: He appears well-developed and well-nourished. He appears distressed.  Eyes: EOM are normal. Pupils are equal, round, and reactive to light.  Cardiovascular: Regular rhythm and normal heart sounds.         Tachycardia, unable to distinguish if murmurs.   Pulmonary/Chest: Effort normal and breath sounds normal. No respiratory distress. He exhibits no tenderness.  Abdominal: Soft. Bowel sounds are normal. He exhibits no distension. There is no tenderness. There is no rebound.  Musculoskeletal: He exhibits tenderness.  Neurological: He is alert. No cranial nerve deficit.  Skin: Skin is warm and dry. He is not diaphoretic.    ED Course  Procedures (including critical care time)  Labs Reviewed  POCT I-STAT, CHEM 8 - Abnormal; Notable for the following:    Sodium 121 (*)    Chloride 88 (*)    Glucose, Bld 213 (*)    Calcium, Ion 1.03 (*)    All other components within normal limits  CBC - Abnormal; Notable for the following:    WBC 19.7 (*)    RBC 4.11 (*)    HCT 37.4 (*)    MCHC 37.2 (*) RARE SPHEROCYTES SEEN ON SLIDE   Platelets 506 (*)    All other components within normal limits  PROTIME-INR  ETHANOL  I-STAT, CHEM 8  I-STAT TROPONIN I  I-STAT TROPONIN I   Ct Angio Chest W/cm &/or Wo Cm  06/26/2011  *RADIOLOGY REPORT*  Clinical Data:  Severe mid to upper back pain for several days, evaluate for dissection  CT ANGIOGRAPHY CHEST WITH CONTRAST  Technique:  Multidetector CT imaging of the chest was performed using the standard protocol during bolus administration of intravenous contrast.  Multiplanar CT image reconstructions including MIPs were obtained to evaluate the vascular anatomy.  Contrast:  100 ml Omnipaque-300  Comparison:  Chest x-ray of 06/26/2011  Findings:  The pulmonary arteries opacify well and there is no evidence of acute pulmonary embolism.  The thoracic aorta opacifies with no evidence of dissection.  There is a variation of an aberrant right subclavian artery coursing posterior to the esophagus.  The origins of the great vessels are patent.  The thyroid gland is unremarkable.  No mediastinal or hilar adenopathy is seen.  The heart is mildly enlarged.  No abnormality of the  upper abdomen is noted.  On the lung window images, there is parenchymal opacity in both lower lobes.  Some of this opacity may be due to atelectasis, but developing pneumonia is a definite consideration.  No pleural effusion is seen.  Mild centrilobular emphysema is present.  On bone window images there is partial compression deformity of T5 vertebral body of uncertain age.  No retropulsion is seen. Clinical correlation is recommended.  Review of the MIP images confirms the above findings.  IMPRESSION:  1.  No evidence of thoracic aortic dissection.  An aberrant right subclavian artery is noted. 2.  No evidence of acute pulmonary embolism. 3.  Parenchymal opacities in both lower lobes posteriorly suspicious for developing pneumonia.  4.  Partial compression deformity of T5 vertebral body of uncertain age.  No retropulsion.  Correlate clinically.  Original Report Authenticated By: Joretta Bachelor, M.D.   Dg Chest Portable 1 View  06/26/2011  *RADIOLOGY REPORT*  Clinical Data: Severe mid thoracic back pain, hypertension  PORTABLE CHEST - 1 VIEW  Comparison: Chest x-ray of 01/26/2011  Findings: There are prompt markings at the lung bases and pneumonia is a consideration.  A two-view chest x-ray is recommended.  No definite effusion is seen.  The heart is mildly enlarged.  Also, minimal pulmonary vascular congestion is a consideration.  No bony abnormality is seen  IMPRESSION:  1. Prominent markings at the lung bases.  Possible atelectasis but cannot exclude pneumonia. 2.  Cardiomegaly and question of mild pulmonary vascular congestion.  Original Report Authenticated By: Joretta Bachelor, M.D.     1. Compression fracture of thoracic vertebra   2. Hyponatremia       MDM  Pt arrived with new onset severe back pain between the shoulder blades and pain medication was given and concern for aortic dissection prompted stat chest x-ray and CT angio of the chest. PT/INR and I stat chem 8 were ordered to check for renal  function. Once life-threatening conditions are ruled out other etiologies for the back pain will be explored. Pt underwent prostatectomy within the last year so bone metastasis may need to be excluded as well as traumatic injury to the muscle or spine.   Pt CT angio chest r/o thoracic aorta dissection but did reveal thoracic T5 compression fracture. He was slightly hyponatremic on i stat so 1 L of NaCl was given. He will be admitted overnight to evaluate for his syncope related to fall and hyponatremia.         Vertell Novak, MD Resident 06/26/11 [phone]

## 2011-06-26 NOTE — ED Notes (Signed)
Delay with CT checked

## 2011-06-26 NOTE — ED Notes (Signed)
MD at bedside. Family also at bedside. Pt requesting more pain med.

## 2011-06-26 NOTE — ED Notes (Signed)
Pt transported to CT ?

## 2011-06-26 NOTE — ED Notes (Signed)
Report to [facility] in yellow

## 2011-06-26 NOTE — Progress Notes (Signed)
Called by RN regarding intractable pain despite IV Dilaudid doses. Chart/labs reviewed. Concern over more than thoracic compression fracture causing pain especially in setting of associated leukocytosis and hyperglycemia. Will obtain CT thoracic and lumbar spine with contrast to r/o possible abscess. Could also have mets from prostate cancer. Due to hyperglycemia will check CBG and offer SSI if needed. Will also add nicotine patch.  Agree with Ebony Hail

## 2011-06-26 NOTE — H&P (Signed)
Hospital Admission Note Date: 06/26/2011  Patient name: Douglas Ballard           Medical record number: [id number] Date of birth: March 17, 1951           Age: 60 y.o.   Gender: male    PCP:   Stephens Shire, MD   Chief Complaint:  Back pain  HPI: Douglas Ballard is a 60 y.o. male with his past medical history of prostate cancer and hypertension. Patient brought to the hospital by his wife after he was found confused this morning. He was doing fine last night this morning she she did find him in the bathroom confused in the morning he was not answering her questions. She called EMS and patient brought to the hospital for further evaluation. At the time of admission patient was mentioning about severe back pain and his blood pressure was in the high side with systolic blood pressure [id number]. There was good there was concern about dissect dissecting aneurysm so CT angiogram of the chest was done which showed no intra-thoracic process. But he did show T5 compression fracture of undetermined age. His blood work also showed hyponatremia with sodium 121. Patient will be admitted to the hospital for further evaluation.  Past Medical History: Past Medical History  Diagnosis Date  . Hypertension    History reviewed. No pertinent past surgical history.  Medications: Prior to Admission medications   Medication Sig Start Date End Date Taking? Authorizing Provider  Multiple Vitamins-Minerals (MULTIVITAMINS THER. W/MINERALS) TABS Take 1 tablet by mouth daily.     Yes Historical Provider, MD  Olmesartan-Amlodipine-HCTZ (TRIBENZOR) 40-5-12.5 MG TABS Take 1 tablet by mouth every other day. Patient alternates with Tribenzor 40-5-25mg  tab every other day.   Yes Historical Provider, MD  Olmesartan-Amlodipine-HCTZ (TRIBENZOR) 40-5-25 MG TABS Take 1 tablet by mouth every other day. Patient alternates with Tribenzor 40-5-12.5mg  tab every other day.    Yes Historical Provider, MD  Omega-3 Fatty Acids (FISH OIL  CONCENTRATE PO) Take 1 capsule by mouth daily.     Yes Historical Provider, MD  promethazine (PHENERGAN) 25 MG tablet Take 25 mg by mouth every 6 (six) hours as needed. For nausea    Yes Historical Provider, MD  tiZANidine (ZANAFLEX) 4 MG tablet Take 4 mg by mouth every 6 (six) hours as needed. For headache    Yes Historical Provider, MD  zolpidem (AMBIEN) 10 MG tablet Take 10 mg by mouth at bedtime as needed. For sleep    Yes Historical Provider, MD    Allergies:   Allergies  Allergen Reactions  . Sulfa Antibiotics Other (See Comments)    headaches    Social History:  reports that he has been smoking Cigarettes.  He does not have any smokeless tobacco history on file. He reports that he drinks alcohol. Drinks 3-4 ounces of vodka since he was he was 60 year old He reports that he does not use illicit drugs.  Family History: History reviewed. No pertinent family history.  Review of Systems:  Constitutional: negative for anorexia, fevers and sweats Eyes: negative for irritation, redness and visual disturbance Ears, nose, mouth, throat, and face: negative for earaches, epistaxis, nasal congestion and sore throat Respiratory: negative for cough, dyspnea on exertion, sputum and wheezing Cardiovascular: negative for chest pain, dyspnea, lower extremity edema, orthopnea, palpitations and syncope Gastrointestinal: negative for abdominal pain, constipation, diarrhea, melena, nausea and vomiting Genitourinary:negative for dysuria, frequency and hematuria Hematologic/lymphatic: negative for bleeding, easy bruising and lymphadenopathy Musculoskeletal:negative for arthralgias, muscle weakness and stiff  joints Neurological: negative for coordination problems, gait problems, headaches and weakness Endocrine: negative for diabetic symptoms including polydipsia, polyuria and weight loss Allergic/Immunologic: negative for anaphylaxis, hay fever and urticaria  Physical Exam: BP 142/93  Pulse 88   Temp(Src) 98.5 F (36.9 C) (Oral)  Resp 19  SpO2 97% General appearance: alert, cooperative and no distress appears uncomfortable Head: Normocephalic, without obvious abnormality, atraumatic  Eyes: conjunctivae/corneas clear. PERRL, EOM's intact. Fundi benign.  Nose: Nares normal. Septum midline. Mucosa normal. No drainage or sinus tenderness.  Throat: lips, mucosa, and tongue normal; teeth and gums normal  Neck: Supple, no masses, no cervical lymphadenopathy, no JVD appreciated, no meningeal signs Resp: clear to auscultation bilaterally  Chest wall: no tenderness  Cardio: regular rate and rhythm, S1, S2 normal, no murmur, click, rub or gallop  GI: soft, non-tender; bowel sounds normal; no masses, no organomegaly  Extremities: extremities normal, atraumatic, no cyanosis or edema  Skin: Skin color, texture, turgor normal. No rashes or lesions  Neurologic: Alert and oriented X 3, normal strength and tone. Normal symmetric reflexes. Normal coordination and gait  Labs on Admission:   [facility] 06/26/11 0946  NA 121*  K 4.1  CL 88*  CO2 --  GLUCOSE 213*  BUN 17  CREATININE 1.10  CALCIUM --  MG --  PHOS --   No results found for this basename: AST:2,ALT:2,ALKPHOS:2,BILITOT:2,PROT:2,ALBUMIN:2 in the last 72 hours No results found for this basename: LIPASE:2,AMYLASE:2 in the last 72 hours  Basename 06/26/11 1055 06/26/11 0946  WBC 19.7* --  NEUTROABS -- --  HGB 13.9 13.9  HCT 37.4* 41.0  MCV 91.0 --  PLT 506* --   No results found for this basename: CKTOTAL:3,CKMB:3,CKMBINDEX:3,TROPONINI:3 in the last 72 hours No results found for this basename: TSH,T4TOTAL,FREET3,T3FREE,THYROIDAB in the last 72 hours No results found for this basename: VITAMINB12:2,FOLATE:2,FERRITIN:2,TIBC:2,IRON:2,RETICCTPCT:2 in the last 72 hours  Radiological Exams on Admission: Ct Head Wo Contrast 06/22/2011  *RADIOLOGY REPORT*   IMPRESSION: Negative.    Ct Angio Chest W/cm &/or Wo Cm 06/26/2011   *RADIOLOGY REPORT*   IMPRESSION:  1.  No evidence of thoracic aortic dissection.  An aberrant right subclavian artery is noted. 2.  No evidence of acute pulmonary embolism. 3.  Parenchymal opacities in both lower lobes posteriorly suspicious for developing pneumonia. 4.  Partial compression deformity of T5 vertebral body of uncertain age.  No retropulsion.    Dg Chest Portable 1 View 06/26/2011  IMPRESSION:  1. Prominent markings at the lung bases.  Possible atelectasis but cannot exclude pneumonia. 2.  Cardiomegaly and question of mild pulmonary vascular congestion.    IMPRESSION: Present on Admission:  .Compression fracture of thoracic vertebra .Acute pain .HTN (hypertension) .PNA (pneumonia) .Hyponatremia  Assessment/Plan 1. Compression fracture of thoracic vertebra: This is probably what is causing the back pain. Orthopedic surgeon Dr. Tamera Punt was called by the emergency department physician to evaluate that. Meanwhile symptomatic treatment with IV narcotics will be initiated.  2. Pneumonia: The patient denies cough denies cough and sputum production. CT scan showed early pneumonia" the leukocytosis patient is started on Rocephin and azithromycin. We'll followup.  3. Hypertension: Blood pressure medication will be restarted. Because the patient has low sodium bowels stop the hydrochlorothiazide but will continue with the amlodipine and the Benicar.  4. Hyponatremia: This is likely secondary to hydrochlorothiazide and to [place]. Will discontinue Will discontinue the HCTZ and continue the other medications.  Veronda Gabor A 06/26/2011, 2:15 PM

## 2011-06-27 ENCOUNTER — Inpatient Hospital Stay (HOSPITAL_COMMUNITY): Payer: BC Managed Care – PPO

## 2011-06-27 ENCOUNTER — Encounter (HOSPITAL_COMMUNITY): Payer: Self-pay | Admitting: Physician Assistant

## 2011-06-27 DIAGNOSIS — Z720 Tobacco use: Secondary | ICD-10-CM | POA: Diagnosis present

## 2011-06-27 DIAGNOSIS — D72829 Elevated white blood cell count, unspecified: Secondary | ICD-10-CM | POA: Diagnosis present

## 2011-06-27 DIAGNOSIS — F10239 Alcohol dependence with withdrawal, unspecified: Secondary | ICD-10-CM | POA: Clinically undetermined

## 2011-06-27 DIAGNOSIS — F1020 Alcohol dependence, uncomplicated: Secondary | ICD-10-CM | POA: Diagnosis present

## 2011-06-27 LAB — GLUCOSE, CAPILLARY
Glucose-Capillary: 144 mg/dL — ABNORMAL HIGH (ref 70–99)
Glucose-Capillary: 159 mg/dL — ABNORMAL HIGH (ref 70–99)
Glucose-Capillary: 172 mg/dL — ABNORMAL HIGH (ref 70–99)

## 2011-06-27 LAB — COMPREHENSIVE METABOLIC PANEL
ALT: 12 U/L (ref 0–53)
BUN: 9 mg/dL (ref 6–23)
Calcium: 8.7 mg/dL (ref 8.4–10.5)
Creatinine, Ser: 0.77 mg/dL (ref 0.50–1.35)
GFR calc Af Amer: >90 mL/min (ref >90)
Glucose, Bld: 146 mg/dL — ABNORMAL HIGH (ref 70–99)
Sodium: 125 mEq/L — ABNORMAL LOW (ref 135–145)
Total Protein: 6.8 g/dL (ref 6.0–8.3)

## 2011-06-27 LAB — CREATININE, URINE, RANDOM: Creatinine, Urine: 70.03 mg/dL

## 2011-06-27 MED ORDER — LORAZEPAM 1 MG PO TABS: 1.0000 mg | ORAL_TABLET | Freq: Four times a day (QID) | ORAL | Status: DC | PRN

## 2011-06-27 MED ORDER — HYDROMORPHONE HCL PF 1 MG/ML IJ SOLN
1.0000 mg | INTRAMUSCULAR | Status: DC | PRN
  Administered 2011-06-27 – 2011-06-28 (×6): 1 mg via INTRAVENOUS
  Filled 2011-06-27 (×6): qty 1

## 2011-06-27 MED ORDER — THIAMINE HCL 100 MG/ML IJ SOLN
100.0000 mg | Freq: Every day | INTRAMUSCULAR | Status: DC
  Administered 2011-06-28: 100 mg via INTRAVENOUS
  Filled 2011-06-27 (×2): qty 1

## 2011-06-27 MED ORDER — LORAZEPAM 2 MG/ML IJ SOLN
1.0000 mg | Freq: Four times a day (QID) | INTRAMUSCULAR | Status: DC | PRN
  Administered 2011-06-27: 1 mg via INTRAVENOUS
  Filled 2011-06-27: qty 1

## 2011-06-27 MED ORDER — LORAZEPAM 0.5 MG PO TABS
0.5000 mg | ORAL_TABLET | Freq: Four times a day (QID) | ORAL | Status: DC | PRN
  Administered 2011-06-27 (×2): 0.5 mg via ORAL
  Filled 2011-06-27 (×2): qty 1

## 2011-06-27 NOTE — Progress Notes (Signed)
Subjective: "I want to go home"  Back pain improved on dilaudid.  Family provided more history.   Patient reports he drinks 3 oz a day - wife reports he drinks much more.  Yesterday, patient was found slumped over in the bathroom, staring straight ahead, not acting appropriately.  Wife reports about an hour later the patients mental status returned to normal.  The family questions whether or not he could have had a seizure.     Further, they report he had fever approx 3 weeks ago, and was treated with antibiotics.  Several days ago he fell and had a concussion. The next day in his urologist's office he was found to have a high white count.  The urologist Rx'd a sulfa antibiotic & after two days the patient vomited extensively and experienced another fall.  Shortly there after he had the episode for which he was admitted.  Overnight the patient experienced severe pain - felt to be much worse than typical for a vertebral fracture.  The family describes impulsive, agitated behavior on the part of the patient.  Family requests that Dr. Stormy Fabian office be called if Ortho is needed.  Objective: Weight change:   Intake/Output Summary (Last 24 hours) at 06/27/11 1031 Last data filed at 06/27/11 0754  Gross per 24 hour  Intake   2589 ml  Output      0 ml  Net   2589 ml   Blood pressure 145/80, pulse 76, temperature 99.2 F (37.3 C), temperature source Oral, resp. rate 18, height 5\' 9"  (1.753 m), weight 77.837 kg (171 lb 9.6 oz), SpO2 92.00%.   Physical Exam: General: Alert and awake oriented x3 not in any acute distress. HEENT: anicteric sclera, pupils pinpoint CVS: S1-S2 clear no murmur rubs or gallops Chest: clear to auscultation bilaterally, no wheezing rales or rhonchi Abdomen: soft nontender, distended, decreased bowel sounds, no organomegaly Extremities: no cyanosis, clubbing or edema noted bilaterally Neuro: Cranial nerves II-XII intact, no focal neurological deficits   Lab  Results:  Basename 06/26/11 1055 06/26/11 0946  WBC 19.7* --  HGB 13.9 13.9  HCT 37.4* 41.0  PLT 506* --   BMET  Basename 06/27/11 0800 06/26/11 0946  NA 125* 121*  K 4.3 4.1  CL 92* 88*  CO2 23 --  GLUCOSE 146* 213*  BUN 9 17  CREATININE 0.77 1.10  CALCIUM 8.7 --    Micro Results: No results found for this or any previous visit (from the past 240 hour(s)).  Studies/Results: Ct Head Wo Contrast  06/22/2011  Head CT.  IMPRESSION: Negative.    06/26/2011   CT ANGIOGRAPHY CHEST WITH CONTRAST     IMPRESSION:  1.  No evidence of thoracic aortic dissection.  An aberrant right subclavian artery is noted. 2.  No evidence of acute pulmonary embolism. 3.  Parenchymal opacities in both lower lobes posteriorly suspicious for developing pneumonia. 4.  Partial compression deformity of T5 vertebral body of uncertain age.  No retropulsion.   Dg Chest Portable 1 View  06/26/2011   PORTABLE CHEST - 1 VIEW  Comparison: Chest x-ray of 01/26/2011  IMPRESSION:  1. Prominent markings at the lung bases.  Possible atelectasis but cannot exclude pneumonia. 2.  Cardiomegaly and question of mild pulmonary vascular congestion.  .    Medications: Scheduled Meds:   . sodium chloride   Intravenous STAT  . amLODipine  5 mg Oral Daily  . azithromycin  500 mg Intravenous Once  . azithromycin  500 mg  Intravenous Q24H  . cefTRIAXone (ROCEPHIN) IV  1 g Intravenous Q24H  . dextrose      . folic acid  1 mg Oral Daily  . HYDROmorphone      .  HYDROmorphone (DILAUDID) injection  2 mg Intravenous Once  . HYDROmorphone PCA 0.3 mg/mL   Intravenous Q4H  . influenza  inactive virus vaccine  0.5 mL Intramuscular Once  . insulin aspart  0-9 Units Subcutaneous TID WC  . multivitamins ther. w/minerals  1 tablet Oral Daily  . nicotine  14 mg Transdermal Daily  . olmesartan  40 mg Oral Daily  . omega-3 acid ethyl esters  1 g Oral Daily  . pneumococcal 23 valent vaccine  0.5 mL Intramuscular Tomorrow-1000  .  polyethylene glycol  17 g Oral Daily  . sodium chloride  1,000 mL Intravenous Once  . thiamine  100 mg Intravenous Once  . thiamine  100 mg Intravenous Daily  . thiamine  100 mg Oral Daily  . DISCONTD: azithromycin      . DISCONTD: cefTRIAXone (ROCEPHIN) IV  1 g Intravenous Once  . DISCONTD: cefTRIAXone (ROCEPHIN) IV  1 g Intravenous Q24H  . DISCONTD: cefTRIAXone (ROCEPHIN) IV  1 g Intravenous Q24H  . DISCONTD: HYDROmorphone      . DISCONTD:  HYDROmorphone (DILAUDID) injection  2 mg Intravenous Once   Continuous Infusions:   . sodium chloride 100 mL/hr at 06/27/11 0115  . DISCONTD: HYDROmorphone     PRN Meds:.acetaminophen, acetaminophen, diphenhydrAMINE, diphenhydrAMINE, HYDROmorphone (DILAUDID) injection, LORazepam, LORazepam, methocarbamol(ROBAXIN) IV, naloxone, ondansetron (ZOFRAN) IV, ondansetron, oxyCODONE, sodium chloride, tiZANidine, zolpidem, DISCONTD:  HYDROmorphone (DILAUDID) injection, DISCONTD: HYDROmorphone, DISCONTD:  HYDROmorphone (DILAUDID) injection, DISCONTD: iohexol, DISCONTD: ondansetron (ZOFRAN) IV  Assessment/Plan: Active Problems:  Alcohol abuse  Alcohol withdrawal  Leukocytosis  Tobacco abuse  Compression fracture of thoracic vertebra  Acute pain  HTN (hypertension)  History of prostate cancer  PNA (pneumonia)  Hyponatremia  1. Compression fracture of thoracic vertebra: This is probably what is causing the back pain. Orthopedic surgeon Dr. Tamera Punt was called by the emergency department physician to evaluate that. Meanwhile symptomatic treatment with IV narcotics will be initiated.   Family requests that Dr. Clayton Bibles office be called.  Patient's pain is now controlled.  MRI Thoracic and Lumbar spine is pending.  2. Pneumonia: The patient denies cough denies cough and sputum production. CT scan showed early pneumonia.  Patient has had leukocytosis for several days. He is on Day 2 Azith and Rocephin.    3. Hypertension: Blood pressure medication  will be restarted. Because the patient has low sodium bowels stop the hydrochlorothiazide but will continue with the amlodipine and the Benicar.   Stable today 06/27/11.  4. Hyponatremia: This is could be secondary to hydrochlorothiazide and to Tribenzor and  it could be compounded by alcoholism.  5.  Alcohol abuse:  Patient reports he's been drinking since college. (3 oz daily) family reports its much heavier than that.  Patient's abdomin is distended like that of an alcoholic.  Will check CMET in am and start him on ativan protocol as his family says he's been exhibiting very agitated and impulsive behavior.  6.  Leukocytosis:  This could be from PNA, or head trauma, or it could be from a combination of these with possible Alcohol withdraw or alcohol induced seizure.   Patient currently on Antibiotics.  Adding MRI brain to the previous MRI order.  Will also add EEG.  Rule out seizure.  Note:  Will consider discontinuing  the PCA pump after the patient is seen by orthopedics.  If the patient is having alcohol withdraw seizures dilaudid could lower his seizure threshold.    LOS: 1 day   YORK,MARIANNE L 06/27/2011, 10:31 AM  Addendum  I personally seen and examined the patient. The chart and the database reviewed  I agree with the above assessment and plan. for full  details please review Mrs. Jerilynn Mages. [place] PA note  [facility] A 06/27/2011 1:42 PM

## 2011-06-27 NOTE — Progress Notes (Signed)
Inpatient Diabetes Program Recommendations  AACE/ADA: New Consensus Statement on Inpatient Glycemic Control (2009)  Target Ranges:  Prepandial:   less than 140 mg/dL      Peak postprandial:   less than 180 mg/dL (1-2 hours)      Critically ill patients:  140 - 180 mg/dL   Reason for Visit: ? New onset diabetes  Inpatient Diabetes Program Recommendations HgbA1C: Result is 6.8 %, average glucose in 140's and is diagnostic for presence of diabetes. Would order RD consult for new onset and in -pt education per bedside RN Outpatient Referral: Please order if appropriate.  Note: No documentation of history of diabetes.  However, A1C is indicative of diagnosis.  Would be glad to talk with patient. Please order  RD consult if appropriate for teaching.  Is patient aware of presence of diabetes?

## 2011-06-27 NOTE — Progress Notes (Signed)
Please change diet to carb modified-medium rather than regular.

## 2011-06-27 NOTE — Progress Notes (Signed)
I was called to the room by the patient's wife as he was pulling off his oxygen, IV, and tugging at his hospital gown.  Wife seemed concerned stating that husband becomes more difficult to handle.    RN noted that when patient takes his oxygen off his sats drop into the high 70s and low 80s.  Will discontinue dilaudid PCA pump.  Will continue to monitor the patient closely but would have a very low threshold to transfer patient to Step down unit.  Discussed with attending, who agrees that he would have a low threshold to transfer the patient to Step Down.

## 2011-06-27 NOTE — Consult Note (Signed)
Reason for Consult: Back Pain following a fall. Referring Physician: Dr. Hartford Poli  HPI: Douglas Ballard is an 60 y.o. male admitted to hospital for pain in his back following a fall.  He was found on the floor of the bathroom by his family and was also confused at the time. A  CT scan was performed which showed a compression fracture at T5. The family has been seen at Dr. Reather Littler office in past.  Dr. Wynelle Link has on call and received the consult.    Past Medical History  Diagnosis Date  . Hypertension   . Cancer     Prostate surgery  . Headache   . Pneumonia   . Diabetes mellitus   . Alcohol withdrawal 06/27/2011    Past Surgical History  Procedure Date  . Prostate surgery 01/31/2011    History reviewed. No pertinent family history.  Social History:  reports that he has been smoking Cigarettes.  He does not have any smokeless tobacco history on file. He reports that he drinks alcohol. He reports that he does not use illicit drugs.  Allergies:  Allergies  Allergen Reactions  . Sulfa Antibiotics Other (See Comments)    headaches    Medications: I have reviewed the patient's current medications.  Results for orders placed during the hospital encounter of 06/26/11 (from the past 48 hour(s))  PROTIME-INR     Status: Normal   Collection Time   06/26/11  8:53 AM      Component Value Range Comment   Prothrombin Time 13.8  11.6 - 15.2 (seconds)    INR 1.04  0.00 - 1.49    POCT I-STAT, CHEM 8     Status: Abnormal   Collection Time   06/26/11  9:46 AM      Component Value Range Comment   Sodium 121 (*) 135 - 145 (mEq/L)    Potassium 4.1  3.5 - 5.1 (mEq/L)    Chloride 88 (*) 96 - 112 (mEq/L)    BUN 17  6 - 23 (mg/dL)    Creatinine, Ser 1.10  0.50 - 1.35 (mg/dL)    Glucose, Bld 213 (*) 70 - 99 (mg/dL)    Calcium, Ion 1.03 (*) 1.12 - 1.32 (mmol/L)    TCO2 23  0 - 100 (mmol/L)    Hemoglobin 13.9  13.0 - 17.0 (g/dL)    HCT 41.0  39.0 - 52.0 (%)   CBC     Status: Abnormal   Collection  Time   06/26/11 10:55 AM      Component Value Range Comment   WBC 19.7 (*) 4.0 - 10.5 (K/uL)    RBC 4.11 (*) 4.22 - 5.81 (MIL/uL)    Hemoglobin 13.9  13.0 - 17.0 (g/dL)    HCT 37.4 (*) 39.0 - 52.0 (%)    MCV 91.0  78.0 - 100.0 (fL)    MCH 33.8  26.0 - 34.0 (pg)    MCHC 37.2 (*) 30.0 - 36.0 (g/dL) RARE SPHEROCYTES SEEN ON SLIDE   RDW 11.9  11.5 - 15.5 (%)    Platelets 506 (*) 150 - 400 (K/uL)   ETHANOL     Status: Normal   Collection Time   06/26/11 10:55 AM      Component Value Range Comment   Alcohol, Ethyl (B) <11  0 - 11 (mg/dL)   HEMOGLOBIN A1C     Status: Abnormal   Collection Time   06/26/11  2:21 PM      Component Value Range Comment  Hemoglobin A1C 6.8 (*) <5.7 (%)    Mean Plasma Glucose 148 (*) <117 (mg/dL)   TSH     Status: Normal   Collection Time   06/26/11  2:21 PM      Component Value Range Comment   TSH 1.483  0.350 - 4.500 (uIU/mL)   GLUCOSE, CAPILLARY     Status: Abnormal   Collection Time   06/26/11  9:58 PM      Component Value Range Comment   Glucose-Capillary 154 (*) 70 - 99 (mg/dL)   GLUCOSE, CAPILLARY     Status: Abnormal   Collection Time   06/27/11  7:56 AM      Component Value Range Comment   Glucose-Capillary 153 (*) 70 - 99 (mg/dL)    Comment 1 Notify RN     COMPREHENSIVE METABOLIC PANEL     Status: Abnormal   Collection Time   06/27/11  8:00 AM      Component Value Range Comment   Sodium 125 (*) 135 - 145 (mEq/L)    Potassium 4.3  3.5 - 5.1 (mEq/L)    Chloride 92 (*) 96 - 112 (mEq/L)    CO2 23  19 - 32 (mEq/L)    Glucose, Bld 146 (*) 70 - 99 (mg/dL)    BUN 9  6 - 23 (mg/dL)    Creatinine, Ser 0.77  0.50 - 1.35 (mg/dL)    Calcium 8.7  8.4 - 10.5 (mg/dL)    Total Protein 6.8  6.0 - 8.3 (g/dL)    Albumin 3.1 (*) 3.5 - 5.2 (g/dL)    AST 19  0 - 37 (U/L)    ALT 12  0 - 53 (U/L)    Alkaline Phosphatase 72  39 - 117 (U/L)    Total Bilirubin 0.6  0.3 - 1.2 (mg/dL)    GFR calc non Af Amer >90  >90 (mL/min)    GFR calc Af Amer >90  >90 (mL/min)     GLUCOSE, CAPILLARY     Status: Abnormal   Collection Time   06/27/11 11:48 AM      Component Value Range Comment   Glucose-Capillary 144 (*) 70 - 99 (mg/dL)    Comment 1 Notify RN     GLUCOSE, CAPILLARY     Status: Abnormal   Collection Time   06/27/11  4:59 PM      Component Value Range Comment   Glucose-Capillary 159 (*) 70 - 99 (mg/dL)    Comment 1 Notify RN       Ct Angio Chest W/cm &/or Wo Cm  06/26/2011  *RADIOLOGY REPORT*  Clinical Data:  Severe mid to upper back pain for several days, evaluate for dissection  CT ANGIOGRAPHY CHEST WITH CONTRAST  Technique:  Multidetector CT imaging of the chest was performed using the standard protocol during bolus administration of intravenous contrast.  Multiplanar CT image reconstructions including MIPs were obtained to evaluate the vascular anatomy.  Contrast:  100 ml Omnipaque-300  Comparison:  Chest x-ray of 06/26/2011  Findings:  The pulmonary arteries opacify well and there is no evidence of acute pulmonary embolism.  The thoracic aorta opacifies with no evidence of dissection.  There is a variation of an aberrant right subclavian artery coursing posterior to the esophagus.  The origins of the great vessels are patent.  The thyroid gland is unremarkable.  No mediastinal or hilar adenopathy is seen.  The heart is mildly enlarged.  No abnormality of the upper abdomen is noted.  On the  lung window images, there is parenchymal opacity in both lower lobes.  Some of this opacity may be due to atelectasis, but developing pneumonia is a definite consideration.  No pleural effusion is seen.  Mild centrilobular emphysema is present.  On bone window images there is partial compression deformity of T5 vertebral body of uncertain age.  No retropulsion is seen. Clinical correlation is recommended.  Review of the MIP images confirms the above findings.  IMPRESSION:  1.  No evidence of thoracic aortic dissection.  An aberrant right subclavian artery is noted. 2.  No  evidence of acute pulmonary embolism. 3.  Parenchymal opacities in both lower lobes posteriorly suspicious for developing pneumonia. 4.  Partial compression deformity of T5 vertebral body of uncertain age.  No retropulsion.  Correlate clinically.  Original Report Authenticated By: Joretta Bachelor, M.D.   Dg Chest Portable 1 View  06/26/2011  *RADIOLOGY REPORT*  Clinical Data: Severe mid thoracic back pain, hypertension  PORTABLE CHEST - 1 VIEW  Comparison: Chest x-ray of 01/26/2011  Findings: There are prompt markings at the lung bases and pneumonia is a consideration.  A two-view chest x-ray is recommended.  No definite effusion is seen.  The heart is mildly enlarged.  Also, minimal pulmonary vascular congestion is a consideration.  No bony abnormality is seen  IMPRESSION:  1. Prominent markings at the lung bases.  Possible atelectasis but cannot exclude pneumonia. 2.  Cardiomegaly and question of mild pulmonary vascular congestion.  Original Report Authenticated By: Joretta Bachelor, M.D.    ROS Blood pressure 169/88, pulse 88, temperature 98.7 F (37.1 C), temperature source Oral, resp. rate 18, height 5\' 9"  (1.753 m), weight 77.837 kg (171 lb 9.6 oz), SpO2 94.00%.  Physical Exam  Musculoskeletal:       Arms:  Patient is a 60 yo male seen in [facility] by Dr. Wynelle Link.  Family in the room present at time of exam. General appearance: alert, cooperative and no distress but is quitea uncomfortable with movement in the bed. Back - Very tender to palpation along thoracic spine in upper region between the shoulder blades. Extremities - Motor function is 5/5 in bilateral lower extremities.  Sensation intact to both lower extremities.   Assessment/Plan: Upper Thoracic Pain  Patient has been seen and evaluated by Dr. Wynelle Link. Addendum to follow.  PERKINS, ALEXZANDREW 06/27/2011, 6:30 PM   I have seen and examined this patient. He has a thoracic compression fracture at T5 which is of uncertain age  on CT but clinically is probably acute given his pain and tenderness to palpation. He has a kyphotic deformity on the CT. He has no neurologic signs or symptoms and there is no canal compromise or retropulsion on the CT. This fracture is relatively high up in the T-spine to effectively brace. I will ask one of our spine specialists if they feel he would be a good candidate for kyphoplasty for pain management and restoration of height of the vertebral body. I will have more information tomorrow  Dione Plover. Elnore Cosens, MD    06/27/2011, 6:57 PM

## 2011-06-27 NOTE — Progress Notes (Signed)
Wasted 2.5 mL (0.75 mg) of dilaudid in sink with Ethelene Hal, RN as a witness.

## 2011-06-27 NOTE — Progress Notes (Addendum)
@   approx 2010, pt jumped OOB screaming that he was in pain.  Medicated pt with what he was able to have at the current time, but pt still yelling and staying on call light continuously asking for pain meds.  Notified provider on call about pain issue and requested a PCA.  Initiated PCA @ approx 2200 and ended up giving the pt the 3 initial bolus doses, and pt became calm and started to rest after the last bolus dose.  Pt. Now resting comfortably in bed.  Held off on MRI until AM due to pt's status.  Will continue to monitor.  Add:  Pt's wife stated that pt had a very low pain tolerance.

## 2011-06-28 ENCOUNTER — Inpatient Hospital Stay (HOSPITAL_COMMUNITY): Payer: BC Managed Care – PPO

## 2011-06-28 DIAGNOSIS — F10239 Alcohol dependence with withdrawal, unspecified: Secondary | ICD-10-CM | POA: Diagnosis present

## 2011-06-28 DIAGNOSIS — F10939 Alcohol use, unspecified with withdrawal, unspecified: Secondary | ICD-10-CM

## 2011-06-28 DIAGNOSIS — R4182 Altered mental status, unspecified: Secondary | ICD-10-CM

## 2011-06-28 HISTORY — DX: Alcohol dependence with withdrawal, unspecified: F10.239

## 2011-06-28 HISTORY — DX: Alcohol use, unspecified with withdrawal, unspecified: F10.939

## 2011-06-28 LAB — CBC
HCT: 32.3 % — ABNORMAL LOW (ref 39.0–52.0)
Hemoglobin: 11.8 g/dL — ABNORMAL LOW (ref 13.0–17.0)
MCH: 33.9 pg (ref 26.0–34.0)
MCH: 33.7 pg (ref 26.0–34.0)
MCHC: 36.5 g/dL — ABNORMAL HIGH (ref 30.0–36.0)
MCHC: 36.6 g/dL — ABNORMAL HIGH (ref 30.0–36.0)
MCV: 92 fL (ref 78.0–100.0)
Platelets: 330 10*3/uL (ref 150–400)
RBC: 3.5 MIL/uL — ABNORMAL LOW (ref 4.22–5.81)
RDW: 11.9 % (ref 11.5–15.5)
RDW: 11.9 % (ref 11.5–15.5)

## 2011-06-28 LAB — COMPREHENSIVE METABOLIC PANEL
Albumin: 3 g/dL — ABNORMAL LOW (ref 3.5–5.2)
BUN: 6 mg/dL (ref 6–23)
Calcium: 9.4 mg/dL (ref 8.4–10.5)
GFR calc Af Amer: >90 mL/min (ref >90)
Glucose, Bld: 133 mg/dL — ABNORMAL HIGH (ref 70–99)
Sodium: 123 mEq/L — ABNORMAL LOW (ref 135–145)
Total Protein: 6.6 g/dL (ref 6.0–8.3)

## 2011-06-28 LAB — DIFFERENTIAL
Basophils Relative: 0 % (ref 0–1)
Eosinophils Absolute: 0 10*3/uL (ref 0.0–0.7)
Eosinophils Relative: 0 % (ref 0–5)
Lymphs Abs: 0.9 10*3/uL (ref 0.7–4.0)
Neutrophils Relative %: 87 % — ABNORMAL HIGH (ref 43–77)

## 2011-06-28 LAB — GLUCOSE, CAPILLARY
Glucose-Capillary: 155 mg/dL — ABNORMAL HIGH (ref 70–99)
Glucose-Capillary: 180 mg/dL — ABNORMAL HIGH (ref 70–99)
Glucose-Capillary: 147 mg/dL — ABNORMAL HIGH (ref 70–99)

## 2011-06-28 MED ORDER — INFLUENZA VAC TYP A&B SURF ANT IM INJ
0.5000 mL | INJECTION | INTRAMUSCULAR | Status: DC
  Filled 2011-06-28: qty 0.5

## 2011-06-28 MED ORDER — INFLUENZA VAC TYP A&B SURF ANT IM INJ
0.5000 mL | INJECTION | INTRAMUSCULAR | Status: DC
  Filled 2011-06-28 (×2): qty 0.5

## 2011-06-28 MED ORDER — GI COCKTAIL ~~LOC~~
30.0000 mL | Freq: Three times a day (TID) | ORAL | Status: DC | PRN
  Administered 2011-06-28: 30 mL via ORAL
  Filled 2011-06-28: qty 30

## 2011-06-28 MED ORDER — LORAZEPAM 1 MG PO TABS
1.0000 mg | ORAL_TABLET | Freq: Four times a day (QID) | ORAL | Status: DC | PRN
  Administered 2011-06-28 – 2011-06-29 (×4): 1 mg via ORAL
  Filled 2011-06-28 (×4): qty 1

## 2011-06-28 MED ORDER — HALOPERIDOL LACTATE 5 MG/ML IJ SOLN
2.0000 mg | INTRAMUSCULAR | Status: DC | PRN
  Administered 2011-06-28 (×2): 2 mg via INTRAVENOUS
  Filled 2011-06-28: qty 1
  Filled 2011-06-28 (×2): qty 0.4

## 2011-06-28 MED ORDER — GUAIFENESIN 100 MG/5ML PO SOLN
5.0000 mL | ORAL | Status: DC | PRN
  Administered 2011-06-28 – 2011-06-30 (×2): 100 mg via ORAL
  Filled 2011-06-28 (×3): qty 5

## 2011-06-28 NOTE — Procedures (Signed)
EEG NUMBER:  [id number].  This routine EEG was requested in this 59 year old man with a history of confusion and falls with a fracture of his thoracic spine.  His medications include hydromorphone and haloperidol as well as Ativan.  The EEG was done with the patient predominantly drowsy but briefly awake.  During periods of maximal wakefulness, he had a moderately regulated, moderately sustained 9 cycle per second posterior dominant rhythm that attenuated with eye opening and was symmetric.  Background activities were composed of low to medium amplitude beta activities that were symmetric.  Photic stimulation produced a symmetric driving response. Hyperventilation was not performed.  The patient spent much of the tracing drowsy.  This was characterized by an increased prominence in the frontal central areas of beta activity along with symmetric slower theta and delta activities as well as the appearance of vertex sharp waves.  CLINICAL INTERPRETATION:  This routine EEG done with the patient predominantly drowsy and briefly awake is normal.  There were no interictal epileptiform discharges, electrographic seizures, or nonconvulsive status epilepticus seen.          ______________________________ Neena Rhymes, MD    UO:[id number] D:  06/28/2011 13:27:15  T:  06/28/2011 14:01:51  Job #:  [id number]

## 2011-06-28 NOTE — Progress Notes (Addendum)
Subjective: 06/28/2011 Per patients wife patient was repeatedly up and out of bed all night.  Pulling off oxygen and de-satting.  Wife reports patient "talks out of his head", and is much worse at night. She describes him as angry and impulsive.   Patient reports his pain is uncontrolled.  06/27/11: "I want to go home"  Back pain improved on dilaudid.  Family provided more history.   Patient reports he drinks 3 oz a day - wife reports he drinks much more.  Yesterday, patient was found slumped over in the bathroom, staring straight ahead, not acting appropriately.  Wife reports about an hour later the patients mental status returned to normal.  The family questions whether or not he could have had a seizure.     Further, they report he had fever approx 3 weeks ago, and was treated with antibiotics.  Several days ago he fell and had a concussion. The next day in his urologist's office he was found to have a high white count.  The urologist Rx'd a sulfa antibiotic & after two days the patient vomited extensively and experienced another fall.  Shortly there after he had the episode for which he was admitted.  Overnight the patient experienced severe pain - felt to be much worse than typical for a vertebral fracture.  The family describes impulsive, agitated behavior on the part of the patient.    Objective:  Intake/Output Summary (Last 24 hours) at 06/28/11 0908 Last data filed at 06/28/11 0448  Gross per 24 hour  Intake   2810 ml  Output      0 ml  Net   2810 ml   Blood pressure 161/77, pulse 89, temperature 99.5 F (37.5 C), temperature source Oral, resp. rate 20, height 5\' 9"  (1.753 m), weight 77.837 kg (171 lb 9.6 oz), SpO2 91.00%.   Physical Exam: General: Awake, drowsy, complaining of pain.  In bed about to have EEG done. HEENT: anicteric sclera, pupils pinpoint CVS: S1-S2 clear no murmur rubs or gallops Chest: clear to auscultation bilaterally, no wheezing rales or rhonchi Abdomen:  soft nontender, distended, decreased bowel sounds, no organomegaly Extremities: no cyanosis, clubbing or edema noted bilaterally Psych:  Patient will answer questions appropriately, but then will start speaking incoherently about something completely unrelated.  Patient repeatedly asks to go home.   Lab Results:  Basename 06/28/11 0600 06/26/11 1055  WBC 19.3* 19.7*  HGB 11.8* 13.9  HCT 32.3* 37.4*  PLT 346 506*   BMET  Basename 06/28/11 0600 06/27/11 0800  NA 123* 125*  K 4.2 4.3  CL 88* 92*  CO2 25 23  GLUCOSE 133* 146*  BUN 6 9  CREATININE 0.77 0.77  CALCIUM 9.4 8.7    Micro Results: No results found for this or any previous visit (from the past 240 hour(s)).  Studies/Results: Ct Head Wo Contrast  06/22/2011  Head CT.  IMPRESSION: Negative.    06/26/2011   CT ANGIOGRAPHY CHEST WITH CONTRAST     IMPRESSION:  1.  No evidence of thoracic aortic dissection.  An aberrant right subclavian artery is noted. 2.  No evidence of acute pulmonary embolism. 3.  Parenchymal opacities in both lower lobes posteriorly suspicious for developing pneumonia. 4.  Partial compression deformity of T5 vertebral body of uncertain age.  No retropulsion.   Dg Chest Portable 1 View  06/26/2011   PORTABLE CHEST - 1 VIEW  Comparison: Chest x-ray of 01/26/2011  IMPRESSION:  1. Prominent markings at the lung bases.  Possible atelectasis but cannot exclude pneumonia. 2.  Cardiomegaly and question of mild pulmonary vascular congestion.  .    Medications: Scheduled Meds:    . amLODipine  5 mg Oral Daily  . azithromycin  500 mg Intravenous Q24H  . cefTRIAXone (ROCEPHIN) IV  1 g Intravenous Q24H  . folic acid  1 mg Oral Daily  . influenza  inactive virus vaccine  0.5 mL Intramuscular Once  . insulin aspart  0-9 Units Subcutaneous TID WC  . multivitamins ther. w/minerals  1 tablet Oral Daily  . nicotine  14 mg Transdermal Daily  . olmesartan  40 mg Oral Daily  . omega-3 acid ethyl esters  1 g Oral Daily   . pneumococcal 23 valent vaccine  0.5 mL Intramuscular Tomorrow-1000  . polyethylene glycol  17 g Oral Daily  . thiamine  100 mg Oral Daily   Continuous Infusions:    . sodium chloride 100 mL/hr at 06/28/11 0709   PRN Meds:.acetaminophen, acetaminophen, diphenhydrAMINE, haloperidol lactate, HYDROmorphone (DILAUDID) injection, LORazepam, methocarbamol(ROBAXIN) IV, ondansetron (ZOFRAN) IV, ondansetron, oxyCODONE, tiZANidine, zolpidem, DISCONTD: diphenhydrAMINE, DISCONTD:  HYDROmorphone (DILAUDID) injection  Assessment/Plan: Active Problems:  Alcohol abuse  Alcohol withdrawal  Leukocytosis  Tobacco abuse  Compression fracture of thoracic vertebra  Acute pain  HTN (hypertension)  History of prostate cancer  PNA (pneumonia)  Hyponatremia  1. Compression fracture of thoracic vertebra:  Dr. Reather Littler office was consulted.  The patient was seen by Dr. Elmyra Ricks.  He recommended consideration of kyphoplasty and will confer with neurosurgery about that today.  The patient's pain is severe.   Pain medications are being given judiciously, because of concern for lowering his seizure threshold and having a recurrent seizure.  Attempted MRI, but patient could not tolerate pain of lying on the table and became hypoxic.  2. Pneumonia: The patient is coughing today. CT scan showed early pneumonia.  Patient has had leukocytosis for several days. He is on Day 3 Azith and Rocephin.  Now with low grade fever.  Will check follow up xray.  Could this be klebsiella?    3. Hypertension: Blood pressure medication will be restarted. Because the patient has low sodium bowels stop the hydrochlorothiazide but will continue with the amlodipine and the Benicar.   Stable   4. Hyponatremia: This is could be secondary to hydrochlorothiazide and to Tribenzor and  it could be compounded by alcoholism.  Still present.  May need to consider fluid restrictions.  Will order 1500 ml/day fluid restriction.  5.  Alcohol abuse and  withdraw:  Patient reports he's been drinking since college. (3 oz daily) family reports its much heavier than that.  Patient's abdomin is distended like that of an alcoholic. Will transfer patient to step down.  Patient is impulsive and quickly becomes hypoxic when he removes his oxygen.  Frequently jumps out of bed.  The patient was initially given a full milligram of Ativan IV, but became too lethargic and confused. Consequently his dose of Ativan was decreased to 0.5 mg by mouth. At this status the patient is to impulsive to be safe. I will change his dose of Ativan to 1 mg by mouth.  6.  Alcohol withdraw seizure:  We suspect the patient seized prior to admission.  MRI brain was ordered but could not be completed secondary to the patient's pain and hypoxia.  EEG is being performed now.  7.  Leukocytosis:  Of uncertain source (PNA v seizure v trauma) - continues.  Does not appear to be improving  on current antibiotics.  Will recheck xray and discuss with attending.  TRANSFERRING TO STEPDOWN FOR CLOSER MONITORING. SECONDARY TO ALCOHOL WITHDRAW.    LOS: 2 days   YORK,MARIANNE L 06/28/2011, 9:08 AM  Addendum  Patient seen and examined, chart and data base reviewed.  I agree with the above assessment and plan  For full details please see Mrs. Imogene Burn PA. Note. Note  Aunesti Pellegrino A, 06/28/2011, 12:35 PM

## 2011-06-28 NOTE — Progress Notes (Signed)
06/28/2011 Douglas Ballard [phone]      60yo male patient admitted 06/26/11 with c/o back pain after wife found him confused at home. Patient has a history of HTN and prostate Ca. Patient being monitored for ETOH withdrawal. Prior to admission, patient lived at home with spouse. Independent with ADLs. In to speak with patient. Denies any home health history or DME. No discharge needs identified. Focus note in shadow chart. CM will continue to monitor.

## 2011-06-28 NOTE — Progress Notes (Signed)
EEG completed at bedside by EEG personnel. Pt tolerated without difficulty, slept through most of it. Results pending.

## 2011-06-28 NOTE — Progress Notes (Signed)
At 2030, pt's wife started calling for more pain meds for pt.  The only thing at this point he was able to have was Robaxin IV.  Notified pharmacy and it took a long time for med to arrive to unit.  After several notifications to pharmacy, med was finally sent after 2 hours.  During this waiting period, pt's wife was extremely upset and mentioned that she wanted pt to go to step down, "where he would have 2 nurses".  RN entered room and assessed pt.  Pt rated his pain at a 4 and he was calm and sitting up in the chair.  When robaxin finally arrived to unit, entered room to give pt and apologized profusely to wife and pt for the delay.    Throughout the rest of the night he became confused and agitated intermittently.  Pt's wife called RN several times stating he was hard to handle and needed help.  When RN entered room immediately after being called, pt seemed calm and was usually already resting back in bed.  He also was experiencing hallucinations and delusions at times.  MD notified and haldol order given.  While in bed, asleep, pt was extremely restless, constantly moving his hands, as if he was doing something, and talking.  Every time pt got OOB, wife wanted RN to give him something else to "knock him out."  Provided support to wife and asked if she would go home today to get some sleep.  She became very tearful. She is requesting that he have a sitter while she is away today.  Informed her that I would notify MD of her request.  Will continue to monitor pt closely and will continue to provide emotional support for his wife.

## 2011-06-29 LAB — GLUCOSE, CAPILLARY: Glucose-Capillary: 147 mg/dL — ABNORMAL HIGH (ref 70–99)

## 2011-06-29 LAB — BASIC METABOLIC PANEL
CO2: 26 mEq/L (ref 19–32)
Chloride: 86 mEq/L — ABNORMAL LOW (ref 96–112)
Creatinine, Ser: 0.74 mg/dL (ref 0.50–1.35)
Glucose, Bld: 134 mg/dL — ABNORMAL HIGH (ref 70–99)

## 2011-06-29 MED ORDER — LORAZEPAM 2 MG/ML IJ SOLN
INTRAMUSCULAR | Status: AC
  Administered 2011-06-29: 0.5 mg via INTRAVENOUS
  Filled 2011-06-29: qty 1

## 2011-06-29 MED ORDER — LORAZEPAM 2 MG/ML IJ SOLN
0.5000 mg | Freq: Once | INTRAMUSCULAR | Status: AC
  Administered 2011-06-29: 0.5 mg via INTRAVENOUS

## 2011-06-29 MED ORDER — PIPERACILLIN-TAZOBACTAM 3.375 G IVPB
3.3750 g | Freq: Three times a day (TID) | INTRAVENOUS | Status: DC
  Administered 2011-06-29 – 2011-07-02 (×9): 3.375 g via INTRAVENOUS
  Filled 2011-06-29 (×11): qty 50

## 2011-06-29 MED ORDER — ALBUTEROL SULFATE (5 MG/ML) 0.5% IN NEBU
2.5000 mg | INHALATION_SOLUTION | RESPIRATORY_TRACT | Status: DC | PRN
  Administered 2011-06-29 (×2): 2.5 mg via RESPIRATORY_TRACT
  Filled 2011-06-29 (×2): qty 0.5

## 2011-06-29 MED ORDER — PREDNISONE 20 MG PO TABS
40.0000 mg | ORAL_TABLET | ORAL | Status: DC
  Administered 2011-06-29 – 2011-07-01 (×3): 40 mg via ORAL
  Filled 2011-06-29 (×4): qty 2

## 2011-06-29 MED ORDER — LORAZEPAM 0.5 MG PO TABS
0.5000 mg | ORAL_TABLET | Freq: Four times a day (QID) | ORAL | Status: DC | PRN
  Administered 2011-06-29 – 2011-06-30 (×2): 0.5 mg via ORAL
  Filled 2011-06-29 (×2): qty 1

## 2011-06-29 MED ORDER — LORAZEPAM 2 MG/ML IJ SOLN: 1.0000 mg | Freq: Once | INTRAMUSCULAR | Status: AC | PRN

## 2011-06-29 MED ORDER — AMLODIPINE BESYLATE 10 MG PO TABS
10.0000 mg | ORAL_TABLET | Freq: Every day | ORAL | Status: DC
  Administered 2011-06-29 – 2011-07-02 (×4): 10 mg via ORAL
  Filled 2011-06-29 (×4): qty 1

## 2011-06-29 MED ORDER — DEXTROSE 5 % IV SOLN
500.0000 mg | Freq: Three times a day (TID) | INTRAVENOUS | Status: DC | PRN
  Filled 2011-06-29: qty 5

## 2011-06-29 NOTE — Progress Notes (Signed)
Subjective:    Patient ID: Douglas Ballard is a 60 y.o. male.  Chief Complaint:Back pain  HPI Douglas Ballard states that his back pain is still present but improved. He is comfortable at rest and has increased pain with attempted activity. He has no neurologic symptoms    Social History   Occupational History  . Not on file.   Social History Main Topics  . Smoking status: Current Everyday Smoker    Types: Cigarettes  . Smokeless tobacco: Not on file  . Alcohol Use: Yes     vodka dailey  . Drug Use: No  . Sexually Active:     ROS       Objective:   Ortho Exam  Still tender t-spine between scauplae     Assessment:     T-5 fracture- Pain improved     Plan:     I called him yesterday to discuss bracing vs. Kyphoplasty. I now feel that kypho is contraindicated ,given his infection, due to risk of getting a spinal infection with significant potential sequelae. If his pain persists/worsens we can order a CTLSO brace for pain control/stabilization.   Dione Plover Iysis Germain, MD    06/29/2011, 6:44 PM

## 2011-06-29 NOTE — Progress Notes (Signed)
Speech Language/Pathology Clinical/Bedside Swallow Evaluation Patient Details  Name: Douglas Ballard MRN: [id number] DOB: 09/21/1950 Today's Date: 06/29/2011    Clinical Impression: Pt with no clinical evidence of oropharyngeal dysphagia.  SOB during assessment but appeared to coordinate respiration/swallowing without difficulty.  No SLP f/u warranted. Risk for Aspiration: Mild Other Related Risk Factors: History of pneumonia;Lethargy  Recommendations Solid Consistency: Regular Liquid Consistency: Thin Medication Administration: Whole meds with liquid Supervision: Patient able to self feed       [name] 06/29/2011,11:26 AM

## 2011-06-29 NOTE — Progress Notes (Signed)
Subjective:  Patient appears to be coherent and lucid this morning. Per nursing the patient was unable to complete his MRI yesterday. He is particularly confused at night. He had a temperature of 101.5, yesterday. He continues to have a nonproductive cough.  06/28/2011  Per patients wife patient was repeatedly up and out of bed all night. Pulling off oxygen and de-satting. Wife reports patient "talks out of his head", and is much worse at night. She describes him as angry and impulsive. Patient reports his pain is uncontrolled.  06/27/11:  "I want to go home" Back pain improved on dilaudid. Family provided more history.  Patient reports he drinks 3 oz a day - wife reports he drinks much more. Yesterday, patient was found slumped over in the bathroom, staring straight ahead, not acting appropriately. Wife reports about an hour later the patients mental status returned to normal. The family questions whether or not he could have had a seizure.  Further, they report he had fever approx 3 weeks ago, and was treated with antibiotics. Several days ago he fell and had a concussion. The next day in his urologist's office he was found to have a high white count. The urologist Rx'd a sulfa antibiotic & after two days the patient vomited extensively and experienced another fall. Shortly there after he had the episode for which he was admitted.  Overnight the patient experienced severe pain - felt to be much worse than typical for a vertebral fracture. The family describes impulsive, agitated behavior on the part of the patient.      Objective: Vital signs in last 24 hours: Filed Vitals:   06/29/11 0052 06/29/11 0400 06/29/11 0712 06/29/11 0800  BP:  151/79  156/86  Pulse:  84 99   Temp:  98.4 F (36.9 C)  98 F (36.7 C)  TempSrc:  Oral  Oral  Resp:  21  21  Height:      Weight:      SpO2: 96% 96%  98%    Intake/Output Summary (Last 24 hours) at 06/29/11 0937 Last data filed at 06/29/11 0800  Gross  per 24 hour  Intake   1130 ml  Output      1 ml  Net   1129 ml    Weight change:  General: Alert, awake, oriented x3, in no acute distress. HEENT: No bruits, no goiter. Heart: Regular rate and rhythm, without murmurs, rubs, gallops. Lungs: Wheezing but no accessory muscle use Abdomen: Soft, nontender, nondistended, positive bowel sounds. Extremities: No clubbing cyanosis or edema with positive pedal pulses. Neuro: Grossly intact, nonfocal.    Lab Results:  Results for orders placed during the hospital encounter of 06/26/11 (from the past 24 hour(s))  GLUCOSE, CAPILLARY     Status: Abnormal   Collection Time   06/28/11 12:19 PM      Component Value Range   Glucose-Capillary 180 (*) 70 - 99 (mg/dL)  CULTURE, BLOOD (ROUTINE X 2)     Status: Normal (Preliminary result)   Collection Time   06/28/11  1:00 PM      Component Value Range   Specimen Description BLOOD ARM RIGHT     Special Requests BOTTLES DRAWN AEROBIC AND ANAEROBIC 10CC EACH     Setup Time [id number]     Culture       Value:        BLOOD CULTURE RECEIVED NO GROWTH TO DATE CULTURE WILL BE HELD FOR 5 DAYS BEFORE ISSUING A FINAL NEGATIVE REPORT  Report Status PENDING    CBC     Status: Abnormal   Collection Time   06/28/11  1:00 PM      Component Value Range   WBC 18.9 (*) 4.0 - 10.5 (K/uL)   RBC 3.50 (*) 4.22 - 5.81 (MIL/uL)   Hemoglobin 11.8 (*) 13.0 - 17.0 (g/dL)   HCT 32.2 (*) 39.0 - 52.0 (%)   MCV 92.0  78.0 - 100.0 (fL)   MCH 33.7  26.0 - 34.0 (pg)   MCHC 36.6 (*) 30.0 - 36.0 (g/dL)   RDW 11.9  11.5 - 15.5 (%)   Platelets 330  150 - 400 (K/uL)  DIFFERENTIAL     Status: Abnormal   Collection Time   06/28/11  1:00 PM      Component Value Range   Neutrophils Relative 87 (*) 43 - 77 (%)   Neutro Abs 16.4 (*) 1.7 - 7.7 (K/uL)   Lymphocytes Relative 5 (*) 12 - 46 (%)   Lymphs Abs 0.9  0.7 - 4.0 (K/uL)   Monocytes Relative 8  3 - 12 (%)   Monocytes Absolute 1.5 (*) 0.1 - 1.0 (K/uL)   Eosinophils Relative 0   0 - 5 (%)   Eosinophils Absolute 0.0  0.0 - 0.7 (K/uL)   Basophils Relative 0  0 - 1 (%)   Basophils Absolute 0.0  0.0 - 0.1 (K/uL)  CULTURE, BLOOD (ROUTINE X 2)     Status: Normal (Preliminary result)   Collection Time   06/28/11  1:10 PM      Component Value Range   Specimen Description BLOOD ARM RIGHT     Special Requests       Value: BOTTLES DRAWN AEROBIC AND ANAEROBIC 10CC BLUE 5CC RED   Setup Time [id number]     Culture       Value:        BLOOD CULTURE RECEIVED NO GROWTH TO DATE CULTURE WILL BE HELD FOR 5 DAYS BEFORE ISSUING A FINAL NEGATIVE REPORT   Report Status PENDING    OSMOLALITY, URINE     Status: Abnormal   Collection Time   06/28/11  1:56 PM      Component Value Range   Osmolality, Ur 297 (*) 390 - 1090 (mOsm/kg)  MRSA PCR SCREENING     Status: Normal   Collection Time   06/28/11  2:28 PM      Component Value Range   MRSA by PCR NEGATIVE  NEGATIVE   GLUCOSE, CAPILLARY     Status: Abnormal   Collection Time   06/28/11  4:28 PM      Component Value Range   Glucose-Capillary 161 (*) 70 - 99 (mg/dL)   Comment 1 Documented in Chart     Comment 2 Notify RN    GLUCOSE, CAPILLARY     Status: Abnormal   Collection Time   06/28/11 10:09 PM      Component Value Range   Glucose-Capillary 147 (*) 70 - 99 (mg/dL)   Comment 1 Notify RN     Comment 2 Documented in Chart    BASIC METABOLIC PANEL     Status: Abnormal   Collection Time   06/29/11  3:45 AM      Component Value Range   Sodium 122 (*) 135 - 145 (mEq/L)   Potassium 4.0  3.5 - 5.1 (mEq/L)   Chloride 86 (*) 96 - 112 (mEq/L)   CO2 26  19 - 32 (mEq/L)   Glucose, Bld 134 (*)  70 - 99 (mg/dL)   BUN 7  6 - 23 (mg/dL)   Creatinine, Ser 0.74  0.50 - 1.35 (mg/dL)   Calcium 9.3  8.4 - 10.5 (mg/dL)   GFR calc non Af Amer >90  >90 (mL/min)   GFR calc Af Amer >90  >90 (mL/min)  GLUCOSE, CAPILLARY     Status: Abnormal   Collection Time   06/29/11  8:00 AM      Component Value Range   Glucose-Capillary 147 (*) 70 - 99  (mg/dL)   Comment 1 Documented in Chart     Comment 2 Notify RN        Micro: Recent Results (from the past 240 hour(s))  CULTURE, BLOOD (ROUTINE X 2)     Status: Normal (Preliminary result)   Collection Time   06/28/11  1:00 PM      Component Value Range Status Comment   Specimen Description BLOOD ARM RIGHT   Final    Special Requests BOTTLES DRAWN AEROBIC AND ANAEROBIC 10CC EACH   Final    Setup Time [id number]   Final    Culture     Final    Value:        BLOOD CULTURE RECEIVED NO GROWTH TO DATE CULTURE WILL BE HELD FOR 5 DAYS BEFORE ISSUING A FINAL NEGATIVE REPORT   Report Status PENDING   Incomplete   CULTURE, BLOOD (ROUTINE X 2)     Status: Normal (Preliminary result)   Collection Time   06/28/11  1:10 PM      Component Value Range Status Comment   Specimen Description BLOOD ARM RIGHT   Final    Special Requests     Final    Value: BOTTLES DRAWN AEROBIC AND ANAEROBIC 10CC BLUE 5CC RED   Setup Time [id number]   Final    Culture     Final    Value:        BLOOD CULTURE RECEIVED NO GROWTH TO DATE CULTURE WILL BE HELD FOR 5 DAYS BEFORE ISSUING A FINAL NEGATIVE REPORT   Report Status PENDING   Incomplete   MRSA PCR SCREENING     Status: Normal   Collection Time   06/28/11  2:28 PM      Component Value Range Status Comment   MRSA by PCR NEGATIVE  NEGATIVE  Final     Studies/Results: Dg Chest Portable 1 View  06/28/2011  *RADIOLOGY REPORT*  Clinical Data: Pneumonia, shortness of breath, follow up  PORTABLE CHEST - 1 VIEW  Comparison: Portable exam 1000 hours compared to 06/26/2011  Findings: Upper normal heart size. Stable mediastinal contours. Bilateral pulmonary infiltrates increased since previous exam. Infiltrates appear greatest at right lower lobe. No gross pleural effusion or pneumothorax.  IMPRESSION: Increased bilateral pulmonary infiltrates.  Original Report Authenticated By: Burnetta Sabin, M.D.    Medications:     . amLODipine  10 mg Oral Daily  . azithromycin   500 mg Intravenous Q24H  . folic acid  1 mg Oral Daily  . influenza (>/= 3 years) inactive virus vaccine  0.5 mL Intramuscular Tomorrow-1000  . insulin aspart  0-9 Units Subcutaneous TID WC  . LORazepam  0.5 mg Intravenous Once  . multivitamins ther. w/minerals  1 tablet Oral Daily  . nicotine  14 mg Transdermal Daily  . olmesartan  40 mg Oral Daily  . omega-3 acid ethyl esters  1 g Oral Daily  . polyethylene glycol  17 g Oral Daily  . thiamine  100  mg Oral Daily  . DISCONTD: amLODipine  5 mg Oral Daily  . DISCONTD: cefTRIAXone (ROCEPHIN) IV  1 g Intravenous Q24H  . DISCONTD: influenza (>/= 3 years) inactive virus vaccine  0.5 mL Intramuscular Tomorrow-1000  . DISCONTD: thiamine  100 mg Intravenous Daily    Assessment: Principal Problem:  *Compression fracture of thoracic vertebra Active Problems:  Acute pain  HTN (hypertension)  History of prostate cancer  PNA (pneumonia)  Hyponatremia  Alcohol abuse  Alcohol withdrawal  Leukocytosis  Tobacco abuse  Alcohol withdrawal seizure   Plan:  1. Compression fracture of thoracic vertebra: Dr. Reather Littler office was consulted. The patient was seen by Dr. Elmyra Ricks. He recommended consideration of kyphoplasty and will confer with neurosurgery about that today. The patient's pain is severe. Pain medications are being given judiciously, because of concern for lowering his seizure threshold and having a recurrent seizure. Attempted MRI, but patient could not tolerate pain of lying on the table and became hypoxic. We will reattempt MRI today 2. Pneumonia: The patient is coughing today. CT scan showed early pneumonia. Patient has had leukocytosis for several days. He is on Day 3 Azith and Rocephin. Now with low grade fever. We will discontinue Rocephin and switch him to Zosyn. Follow cultures.  3. Hypertension: Blood pressure medication will be restarted. Because the patient has low sodium bowels stop the hydrochlorothiazide but will continue with  the amlodipine and the Benicar. Stable . Increased his Norvasc to 10 mg. 4. Hyponatremia: This is could be secondary to hydrochlorothiazide and to Tribenzor and it could be compounded by alcoholism. Still present. May need to consider fluid restrictions. Will order 1500 ml/day fluid restriction.  5. Alcohol abuse and withdraw: Patient reports he's been drinking since college. (3 oz daily) family reports its much heavier than that. Patient's abdomin is distended like that of an alcoholic. Will continue patient on step down unit . Patient is impulsive and quickly becomes hypoxic when he removes his oxygen. Frequently jumps out of bed.  We will minimize his Ativan and his muscle relaxants, to see if his mentation is improved.  6. Alcohol withdraw seizure: We suspect the patient seized prior to admission. MRI brain was ordered but could not be completed secondary to the patient's pain and hypoxia. EEG is being performed now.  7. Leukocytosis: Of uncertain source (PNA v seizure v trauma) - continues. Does not appear to be improving on current antibiotics. Will recheck xray and discuss with attending.  TRANSFERRING TO STEPDOWN FOR CLOSER MONITORING. SECONDARY TO ALCOHOL WITHDRAW.        LOS: 3 days   [facility] 06/29/2011, 9:37 AM

## 2011-06-29 NOTE — Progress Notes (Signed)
ANTIBIOTIC CONSULT NOTE - INITIAL  Pharmacy Consult for Zosyn Indication: Fever, Leukocytosis, no clinical improvement on Azithro  Allergies  Allergen Reactions  . Sulfa Antibiotics Other (See Comments)    headaches    Patient Measurements: Height: 5\' 9"  (175.3 cm) Weight: 171 lb 8.3 oz (77.8 kg) IBW/kg (Calculated) : 70.7  Adjusted Body Weight:   Vital Signs: Temp: 98 F (36.7 C) (11/09 0800) Temp src: Oral (11/09 0800) BP: 156/86 mmHg (11/09 0800) Pulse Rate: 99  (11/09 0712) Intake/Output from previous day: 11/08 0701 - 11/09 0700 In: 1155 [P.O.:505; IV Piggyback:650] Out: 0  Intake/Output from this shift: Total I/O In: -  Out: 1 [Urine:1]  Labs:  [facility] 06/29/11 0345 06/28/11 1300 06/28/11 0600 06/27/11 2213 06/27/11 0800 06/26/11 1055  WBC -- 18.9* 19.3* -- -- 19.7*  HGB -- 11.8* 11.8* -- -- 13.9  PLT -- 330 346 -- -- 506*  LABCREA -- -- -- 70.03 -- --  CREATININE 0.74 -- 0.77 -- 0.77 --   Estimated Creatinine Clearance: 99.4 ml/min (by C-G formula based on Cr of 0.74). No results found for this basename: VANCOTROUGH:2,VANCOPEAK:2,VANCORANDOM:2,GENTTROUGH:2,GENTPEAK:2,GENTRANDOM:2,TOBRATROUGH:2,TOBRAPEAK:2,TOBRARND:2,AMIKACINPEAK:2,AMIKACINTROU:2,AMIKACIN:2, in the last 72 hours   Microbiology: Recent Results (from the past 720 hour(s))  CULTURE, BLOOD (ROUTINE X 2)     Status: Normal (Preliminary result)   Collection Time   06/28/11  1:00 PM      Component Value Range Status Comment   Specimen Description BLOOD ARM RIGHT   Final    Special Requests BOTTLES DRAWN AEROBIC AND ANAEROBIC 10CC EACH   Final    Setup Time [id number]   Final    Culture     Final    Value:        BLOOD CULTURE RECEIVED NO GROWTH TO DATE CULTURE WILL BE HELD FOR 5 DAYS BEFORE ISSUING A FINAL NEGATIVE REPORT   Report Status PENDING   Incomplete   CULTURE, BLOOD (ROUTINE X 2)     Status: Normal (Preliminary result)   Collection Time   06/28/11  1:10 PM      Component Value Range  Status Comment   Specimen Description BLOOD ARM RIGHT   Final    Special Requests     Final    Value: BOTTLES DRAWN AEROBIC AND ANAEROBIC 10CC BLUE 5CC RED   Setup Time [id number]   Final    Culture     Final    Value:        BLOOD CULTURE RECEIVED NO GROWTH TO DATE CULTURE WILL BE HELD FOR 5 DAYS BEFORE ISSUING A FINAL NEGATIVE REPORT   Report Status PENDING   Incomplete   MRSA PCR SCREENING     Status: Normal   Collection Time   06/28/11  2:28 PM      Component Value Range Status Comment   MRSA by PCR NEGATIVE  NEGATIVE  Final     Medical History: Past Medical History  Diagnosis Date  . Hypertension   . Cancer     Prostate surgery  . Headache   . Pneumonia   . Diabetes mellitus   . Alcohol withdrawal 06/27/2011    Medications:  Scheduled:    . amLODipine  10 mg Oral Daily  . azithromycin  500 mg Intravenous Q24H  . folic acid  1 mg Oral Daily  . influenza (>/= 3 years) inactive virus vaccine  0.5 mL Intramuscular Tomorrow-1000  . insulin aspart  0-9 Units Subcutaneous TID WC  . LORazepam  0.5 mg Intravenous Once  .  multivitamins ther. w/minerals  1 tablet Oral Daily  . nicotine  14 mg Transdermal Daily  . olmesartan  40 mg Oral Daily  . omega-3 acid ethyl esters  1 g Oral Daily  . polyethylene glycol  17 g Oral Daily  . thiamine  100 mg Oral Daily  . DISCONTD: amLODipine  5 mg Oral Daily  . DISCONTD: cefTRIAXone (ROCEPHIN) IV  1 g Intravenous Q24H  . DISCONTD: influenza (>/= 3 years) inactive virus vaccine  0.5 mL Intramuscular Tomorrow-1000  . DISCONTD: thiamine  100 mg Intravenous Daily   Assessment: Pt noted with Tmax 101.5 and WBC 18.9 not clinically improving (per MD note) on Azithromycin. Patient recently suffered several falls, alcohol withdrawal seizures, vomiting and which could result in aspiration PNA. Renal function WNL  Goal of Therapy:     Plan:  Zosyn 3.375g IV q8hr.  Alford Highland, [employer] 06/29/2011,10:19 AM

## 2011-06-30 ENCOUNTER — Inpatient Hospital Stay (HOSPITAL_COMMUNITY): Payer: BC Managed Care – PPO

## 2011-06-30 ENCOUNTER — Other Ambulatory Visit (HOSPITAL_COMMUNITY): Payer: BC Managed Care – PPO

## 2011-06-30 LAB — COMPREHENSIVE METABOLIC PANEL
ALT: 12 U/L (ref 0–53)
AST: 24 U/L (ref 0–37)
Albumin: 2.7 g/dL — ABNORMAL LOW (ref 3.5–5.2)
Calcium: 9.7 mg/dL (ref 8.4–10.5)
Creatinine, Ser: 0.76 mg/dL (ref 0.50–1.35)
Sodium: 127 mEq/L — ABNORMAL LOW (ref 135–145)
Total Protein: 6.7 g/dL (ref 6.0–8.3)

## 2011-06-30 LAB — GLUCOSE, CAPILLARY: Glucose-Capillary: 135 mg/dL — ABNORMAL HIGH (ref 70–99)

## 2011-06-30 LAB — CBC
HCT: 31.2 % — ABNORMAL LOW (ref 39.0–52.0)
Hemoglobin: 11.1 g/dL — ABNORMAL LOW (ref 13.0–17.0)
MCV: 92.3 fL (ref 78.0–100.0)
Platelets: 338 10*3/uL (ref 150–400)
RDW: 12 % (ref 11.5–15.5)
WBC: 14 10*3/uL — ABNORMAL HIGH (ref 4.0–10.5)

## 2011-06-30 LAB — AMMONIA: Ammonia: 25 umol/L (ref 11–60)

## 2011-06-30 MED ORDER — GADOBENATE DIMEGLUMINE 529 MG/ML IV SOLN
17.0000 mL | Freq: Once | INTRAVENOUS | Status: AC | PRN
  Administered 2011-06-30: 17 mL via INTRAVENOUS

## 2011-06-30 MED ORDER — HYDROMORPHONE HCL PF 1 MG/ML IJ SOLN: 1.0000 mg | Freq: Four times a day (QID) | INTRAMUSCULAR | Status: DC | PRN

## 2011-06-30 MED ORDER — TRAMADOL HCL 50 MG PO TABS
50.0000 mg | ORAL_TABLET | Freq: Four times a day (QID) | ORAL | Status: DC | PRN
  Administered 2011-06-30 (×2): 50 mg via ORAL
  Filled 2011-06-30 (×3): qty 1

## 2011-06-30 MED ORDER — DEMECLOCYCLINE HCL 150 MG PO TABS
300.0000 mg | ORAL_TABLET | Freq: Two times a day (BID) | ORAL | Status: DC
  Administered 2011-06-30 – 2011-07-02 (×5): 300 mg via ORAL
  Filled 2011-06-30 (×7): qty 2

## 2011-06-30 MED ORDER — OXYCODONE HCL 5 MG PO TABS
5.0000 mg | ORAL_TABLET | Freq: Four times a day (QID) | ORAL | Status: DC | PRN
  Administered 2011-06-30 – 2011-07-01 (×2): 5 mg via ORAL
  Filled 2011-06-30 (×2): qty 1

## 2011-06-30 NOTE — Progress Notes (Signed)
PT/OT/SLP Cancellation Note   Treatment cancelled today due to patient receiving procedure or test - Patient in MRI for evaluation of thoracic compression fracture.   06/30/2011 Jake Bathe, PT  Pager [phone]

## 2011-06-30 NOTE — Progress Notes (Signed)
Currently patient in MRI scanner. Will revisit pt tomorrow

## 2011-06-30 NOTE — Progress Notes (Signed)
Subjective: Fever resolved ,Patient confused last night requiring a sitter.. Per nursing the patient was unable to complete his MRI yesterday. He is particularly confused at night. He has been afebrile since  yesterday. He continues to have a nonproductive cough.   Objective: Vital signs in last 24 hours: Filed Vitals:   06/29/11 1200 06/29/11 1600 06/29/11 2000 06/30/11 0817  BP: 154/93 157/87 150/90 142/78  Pulse: 75 87 84 88  Temp: 98.1 F (36.7 C) 98.6 F (37 C) 98.9 F (37.2 C) 98.4 F (36.9 C)  TempSrc: Oral Oral Oral Oral  Resp: 19 22 20 24   Height:      Weight:      SpO2: 95% 96% 90% 100%    Intake/Output Summary (Last 24 hours) at 06/30/11 1152 Last data filed at 06/29/11 1909  Gross per 24 hour  Intake   1090 ml  Output      2 ml  Net   1088 ml    Weight change:   General: Alert, awake, oriented x3, in no acute distress. HEENT: No bruits, no goiter. Heart: Regular rate and rhythm, without murmurs, rubs, gallops. Lungs: Clear to auscultation bilaterally. Abdomen: Soft, nontender, nondistended, positive bowel sounds. Extremities: No clubbing cyanosis or edema with positive pedal pulses. Neuro: Grossly intact, nonfocal.    Lab Results: Results for orders placed during the hospital encounter of 06/26/11 (from the past 24 hour(s))  GLUCOSE, CAPILLARY     Status: Abnormal   Collection Time   06/29/11  4:39 PM      Component Value Range   Glucose-Capillary 231 (*) 70 - 99 (mg/dL)   Comment 1 Documented in Chart     Comment 2 Notify RN    GLUCOSE, CAPILLARY     Status: Abnormal   Collection Time   06/29/11 10:01 PM      Component Value Range   Glucose-Capillary 177 (*) 70 - 99 (mg/dL)   Comment 1 Notify RN     Comment 2 Documented in Chart    CBC     Status: Abnormal   Collection Time   06/30/11  6:00 AM      Component Value Range   WBC 14.0 (*) 4.0 - 10.5 (K/uL)   RBC 3.38 (*) 4.22 - 5.81 (MIL/uL)   Hemoglobin 11.1 (*) 13.0 - 17.0 (g/dL)   HCT 31.2  (*) 39.0 - 52.0 (%)   MCV 92.3  78.0 - 100.0 (fL)   MCH 32.8  26.0 - 34.0 (pg)   MCHC 35.6  30.0 - 36.0 (g/dL)   RDW 12.0  11.5 - 15.5 (%)   Platelets 338  150 - 400 (K/uL)  COMPREHENSIVE METABOLIC PANEL     Status: Abnormal   Collection Time   06/30/11  6:00 AM      Component Value Range   Sodium 127 (*) 135 - 145 (mEq/L)   Potassium 4.1  3.5 - 5.1 (mEq/L)   Chloride 88 (*) 96 - 112 (mEq/L)   CO2 26  19 - 32 (mEq/L)   Glucose, Bld 118 (*) 70 - 99 (mg/dL)   BUN 10  6 - 23 (mg/dL)   Creatinine, Ser 0.76  0.50 - 1.35 (mg/dL)   Calcium 9.7  8.4 - 10.5 (mg/dL)   Total Protein 6.7  6.0 - 8.3 (g/dL)   Albumin 2.7 (*) 3.5 - 5.2 (g/dL)   AST 24  0 - 37 (U/L)   ALT 12  0 - 53 (U/L)   Alkaline Phosphatase 72  39 -  117 (U/L)   Total Bilirubin 0.7  0.3 - 1.2 (mg/dL)   GFR calc non Af Amer >90  >90 (mL/min)   GFR calc Af Amer >90  >90 (mL/min)  GLUCOSE, CAPILLARY     Status: Abnormal   Collection Time   06/30/11  8:06 AM      Component Value Range   Glucose-Capillary 135 (*) 70 - 99 (mg/dL)   Comment 1 Notify RN     Micro: Recent Results (from the past 240 hour(s))  CULTURE, BLOOD (ROUTINE X 2)     Status: Normal (Preliminary result)   Collection Time   06/28/11  1:00 PM      Component Value Range Status Comment   Specimen Description BLOOD ARM RIGHT   Final    Special Requests BOTTLES DRAWN AEROBIC AND ANAEROBIC 10CC EACH   Final    Setup Time [id number]   Final    Culture     Final    Value:        BLOOD CULTURE RECEIVED NO GROWTH TO DATE CULTURE WILL BE HELD FOR 5 DAYS BEFORE ISSUING A FINAL NEGATIVE REPORT   Report Status PENDING   Incomplete   CULTURE, BLOOD (ROUTINE X 2)     Status: Normal (Preliminary result)   Collection Time   06/28/11  1:10 PM      Component Value Range Status Comment   Specimen Description BLOOD ARM RIGHT   Final    Special Requests     Final    Value: BOTTLES DRAWN AEROBIC AND ANAEROBIC 10CC BLUE 5CC RED   Setup Time [id number]   Final    Culture      Final    Value:        BLOOD CULTURE RECEIVED NO GROWTH TO DATE CULTURE WILL BE HELD FOR 5 DAYS BEFORE ISSUING A FINAL NEGATIVE REPORT   Report Status PENDING   Incomplete   MRSA PCR SCREENING     Status: Normal   Collection Time   06/28/11  2:28 PM      Component Value Range Status Comment   MRSA by PCR NEGATIVE  NEGATIVE  Final     Studies/Results: No results found.  Medications:    . amLODipine  10 mg Oral Daily  . demeclocycline  300 mg Oral Q12H  . folic acid  1 mg Oral Daily  . insulin aspart  0-9 Units Subcutaneous TID WC  . multivitamins ther. w/minerals  1 tablet Oral Daily  . nicotine  14 mg Transdermal Daily  . olmesartan  40 mg Oral Daily  . omega-3 acid ethyl esters  1 g Oral Daily  . piperacillin-tazobactam (ZOSYN)  IV  3.375 g Intravenous Q8H  . polyethylene glycol  17 g Oral Daily  . predniSONE  40 mg Oral 1 day or 1 dose  . thiamine  100 mg Oral Daily  . DISCONTD: azithromycin  500 mg Intravenous Q24H  . DISCONTD: influenza (>/= 3 years) inactive virus vaccine  0.5 mL Intramuscular Tomorrow-1000     Assessment: Principal Problem:  *Compression fracture of thoracic vertebra Active Problems:  Acute pain  HTN (hypertension)  History of prostate cancer  PNA (pneumonia)  Hyponatremia  Alcohol abuse  Alcohol withdrawal  Leukocytosis  Tobacco abuse  Alcohol withdrawal seizure   Plan: Plan:  1. Compression fracture of thoracic vertebra: Dr. Reather Littler office was consulted. The patient was seen by Dr. Elmyra Ricks. He recommended consideration of kyphoplasty vs a brace and will consult IR radiology  today. The patient's pain is moderate . Pain medications are being given judiciously, minimize narcotics due to confusion. Attempted MRI, but patient could not tolerate pain of lying on the table and became hypoxic. We will reattempt MRI today . 2. Pneumonia: The patient is coughing today. CT scan showed early pneumonia. Patient has had leukocytosis for several days.  Now on zosyn. Now with low grade fever. We will discontinue Rocephin and switch him to Zosyn. Follow cultures.  3. Hypertension: Blood pressure medication will be restarted. Because the patient has low sodium bowels stop the hydrochlorothiazide but will continue with the amlodipine and the Benicar. Stable . Increased his Norvasc to 10 mg.  4. Hyponatremia: This is could be secondary to hydrochlorothiazide and to Tribenzor and it could be compounded by alcoholism. Still present. May need to consider fluid restrictions. Will order 1500 ml/day fluid restriction. Start demeclocycline . 5. Alcohol abuse and withdraw: Patient reports he's been drinking since college. (3 oz daily) family reports its much heavier than that. Patient's abdomin is distended like that of an alcoholic. Will continue patient on step down unit . Patient is impulsive and quickly becomes hypoxic when he removes his oxygen. Frequently jumps out of bed.Confused requiring sitter . We will minimize his Ativan and his muscle relaxants, to see if his mentation is improved. Check an ammonia level . 6. Alcohol withdraw seizure: We suspect the patient seized prior to admission. MRI brain was ordered but could not be completed secondary to the patient's pain and hypoxia. EEG is being performed now.  7. Leukocytosis: Of uncertain source (PNA v seizure v trauma) - continues. Does not appear to be improving on current antibiotics. Will recheck xray and discuss with attending.  Continue on  STEPDOWN FOR CLOSER MONITORING. SECONDARY TO ALCOHOL WITHDRAW    LOS: 4 days   [facility] 06/30/2011, 11:52 AM

## 2011-07-01 LAB — GLUCOSE, CAPILLARY
Glucose-Capillary: 119 mg/dL — ABNORMAL HIGH (ref 70–99)
Glucose-Capillary: 191 mg/dL — ABNORMAL HIGH (ref 70–99)
Glucose-Capillary: 276 mg/dL — ABNORMAL HIGH (ref 70–99)

## 2011-07-01 LAB — COMPREHENSIVE METABOLIC PANEL
Albumin: 2.9 g/dL — ABNORMAL LOW (ref 3.5–5.2)
Alkaline Phosphatase: 63 U/L (ref 39–117)
BUN: 12 mg/dL (ref 6–23)
Potassium: 3.6 mEq/L (ref 3.5–5.1)
Total Protein: 7.3 g/dL (ref 6.0–8.3)

## 2011-07-01 NOTE — Consult Note (Cosign Needed Addendum)
IR aware of patient.  MRI reviewed - given complexity of fractures and locations - need Dr. Estanislado Pandy to review on Monday to determine if candidate for VP/KP.  Insurance will be checked on once determined if candidate.  If so - will get on schedule asap.  Will follow up Monday after Dr. Estanislado Pandy has reviewed.

## 2011-07-01 NOTE — Progress Notes (Signed)
Physical Therapy Evaluation Patient Details Name: Douglas Ballard MRN: [id number] DOB: 1950-10-02 Today's Date: 07/01/2011  Problem List:  Patient Active Problem List  Diagnoses  . Compression fracture of thoracic vertebra  . Acute pain  . HTN (hypertension)  . History of prostate cancer  . PNA (pneumonia)  . Hyponatremia  . Alcohol abuse  . Alcohol withdrawal  . Leukocytosis  . Tobacco abuse  . Alcohol withdrawal seizure    Past Medical History:  Past Medical History  Diagnosis Date  . Hypertension   . Cancer     Prostate surgery  . Headache   . Pneumonia   . Diabetes mellitus   . Alcohol withdrawal 06/27/2011   Past Surgical History:  Past Surgical History  Procedure Date  . Prostate surgery 01/31/2011    PT Assessment/Plan/Recommendation PT Assessment Clinical Impression Statement: Patient admitted with thoracic compression fracture, and AMS.  Minimal back pain present.  Patient with some lingering cognitive issues, and also with slightly decreased balance with gait.  May be appropriate for OP PT follow up - will continue to monitor. PT Recommendation/Assessment: Patient will need skilled PT in the acute care venue PT Problem List: Decreased balance;Decreased cognition;Decreased knowledge of use of DME;Pain Barriers to Discharge: None PT Therapy Diagnosis : Abnormality of gait;Acute pain;Altered mental status PT Plan PT Frequency: Min 3X/week PT Treatment/Interventions: DME instruction;Gait training;Stair training;Functional mobility training;Balance training;Cognitive remediation;Patient/family education PT Recommendation Follow Up Recommendations: Outpatient PT (Will continue to monitor need for OP PT for balance) Equipment Recommended: Kasandra Knudsen (May possibly benefit from use of cane) PT Goals  Acute Rehab PT Goals PT Goal Formulation: With patient/family Time For Goal Achievement: 3 days Pt will Transfer Sit to Stand/Stand to Sit: with modified independence;with  upper extremity assist PT Transfer Goal: Sit to Stand/Stand to Sit - Progress: Not met Pt will Ambulate: >150 feet;with modified independence;with least restrictive assistive device;Other (comment) (without loss of balance) PT Goal: Ambulate - Progress: Not met Pt will Go Up / Down Stairs: 3-5 stairs;with supervision;with least restrictive assistive device PT Goal: Up/Down Stairs - Progress: Not met  PT Evaluation Precautions/Restrictions  Precautions Precautions: Fall Precaution Comments: Balance somewhat decreased with gait Required Braces or Orthoses: No Restrictions Weight Bearing Restrictions: No Prior Functioning  Home Living Lives With: Spouse;Daughter Receives Help From: Friend(s);Neighbor Type of Home: House Home Layout: Multi-level;Able to live on main level with bedroom/bathroom;1/2 bath on main level Home Access: Stairs to enter Entrance Stairs-Rails: None Entrance Stairs-Number of Steps: 3 Bathroom Shower/Tub: Walk-in shower (with built-in seat on second floor) Bathroom Toilet: Standard Bathroom Accessibility: Yes How Accessible: Accessible via walker Home Adaptive Equipment: None Prior Function Level of Independence: Independent with basic ADLs;Independent with homemaking with ambulation;Independent with gait Driving: Yes Vocation: Full time employment 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 Cognition Cognition Arousal/Alertness: Awake/alert Overall Cognitive Status: Appears within functional limits for tasks assessed (Has some difficulty remaining on topic when talking) Orientation Level: Oriented X4 Cognition - Other Comments: Rambles at times, difficulty answering questions directly.  Did answer all questions regarding his hospitalization. Sensation/Coordination Sensation Light Touch: Appears Intact Coordination Gross Motor Movements are Fluid and Coordinated: Yes Extremity Assessment RUE Assessment RUE Assessment: Within Functional Limits LUE  Assessment LUE Assessment: Within Functional Limits RLE Assessment RLE Assessment: Within Functional Limits LLE Assessment LLE Assessment: Within Functional Limits Mobility (including Balance) Bed Mobility Bed Mobility: Yes Supine to Sit: 7: Independent;HOB flat Sit to Supine - Left: 7: Independent;HOB flat Transfers Transfers: Yes Sit to Stand: 5: Supervision;With upper  extremity assist;From bed Sit to Stand Details (indicate cue type and reason): Cues to move slowly during transitions for safety. Stand to Sit: 5: Supervision;With upper extremity assist;To bed Ambulation/Gait Ambulation/Gait: Yes Ambulation/Gait Assistance: 5: Supervision Ambulation/Gait Assistance Details (indicate cue type and reason): Did lose balance x2 during gait, when talking with PT (turning head). Ambulation Distance (Feet): 200 Feet Assistive device: None Gait Pattern: Within Functional Limits (General gait pattern WFL - decreased balance) Gait velocity: Slow gait speed. Stairs: No  Balance Balance Assessed: Yes Dynamic Standing Balance Dynamic Standing - Balance Support: No upper extremity supported Dynamic Standing - Level of Assistance: 5: Stand by assistance Dynamic Standing - Balance Activities: Other (comment) (Walking around objects, stepping over objects, ...) Dynamic Standing - Comments: Patient lost balance x2 with higher level gait activities - turning head to talk to PT, walking around objects.   End of Session PT - End of Session Equipment Utilized During Treatment: Gait belt Activity Tolerance: Patient tolerated treatment well Patient left: in bed;with call bell in reach;with family/visitor present Nurse Communication: Mobility status for ambulation General Behavior During Session: [facility] for tasks performed Cognition: [facility] for tasks performed  Despina Pole [id number] 07/01/2011, 7:50 PM

## 2011-07-01 NOTE — Progress Notes (Signed)
Douglas Ballard 60 y.o. 06/26/2011   Lab. Results:  Basename 06/30/11 0600 06/28/11 1300  WBC 14.0* 18.9*  HGB 11.1* 11.8*  HCT 31.2* 32.2*  PLT 338 330   BMET  Basename 07/01/11 0832 06/30/11 0600  NA 126* 127*  K 3.6 4.1  CL 85* 88*  CO2 28 26  GLUCOSE 108* 118*  BUN 12 10  CREATININE 0.76 0.76  CALCIUM 9.9 9.7    INR  Date Value Range Status  06/26/2011 1.04  0.00-1.49 (no units) Final   VITALS Filed Vitals:   07/01/11 0800  BP: 158/80  Pulse: 81  Temp: 98.2 F (36.8 C)  Resp: 21     Subjective Patient is alert and orientated.  No significant complaints of thoracic pain at this time.  Indicates that he has ambulated in the room.  Denies weakness, numbness in the LE   Objective NVI No focal neuro deficits noted No significant thoracic pain Denies SOB/CP Abd soft/nt   Assessment/ Plan MRI reviewed: no evidence of cord compression/signal changes.  Small amount of retropulsion is noted.  FX pattern consistent with burst fx (posterior wall compromise) Patient with stable fx to thoracic spine. NO evidence of neuro deficits If pain is well controlled with oral meds and patient is ambulatory I don not see the need for a kyphoplasty Rib cage is intact and this will serve as an internal brace to maintain stability If patient has increased pain or inability to ambulate because of pain than re-consider kyphoplasty Patient and wife are in agreement with the plan Patient can F/U with either Dr. Tonita Cong or myself in 2-3 weeks for repeat xrays.  MRI results: T-spine: IMPRESSION:  1. Greater than 50% compression fracture at T5 with slight  retropulsion or retrolisthesis and focal kyphosis.  2. Left greater than right foraminal stenosis at T5-6.  3. Additional superior endplate fractures are present at T6, T2,  and T3, to a lesser extent.  4. Focal T1 shortening posterior to the T5-T6 and T7 vertebral  bodies likely represents some element of hemorrhage. There is no    canal compromise.  5. Bilateral pneumonia and effusions.  Lumbar spine: IMPRESSION:  1. Disc bulging and endplate osteophyte formation at L5-S1 results  in mild right foraminal narrowing.  2. Mild straightening of the normal lumbar lordosis.  3. No other significant disc disease or stenosis   Larhonda Dettloff D 11/11/201211:33 AM

## 2011-07-01 NOTE — Progress Notes (Signed)
Subjective:  Left confused this morning, afebrile overnight  Objective: Vital signs in last 24 hours: Filed Vitals:   06/30/11 2000 06/30/11 2300 07/01/11 0300 07/01/11 0800  BP: 145/73 154/78 154/81 158/80  Pulse: 79 76 73 81  Temp: 98.5 F (36.9 C) 98.2 F (36.8 C) 98 F (36.7 C) 98.2 F (36.8 C)  TempSrc: Oral Oral Oral Oral  Resp: 16 25 20 21   Height:      Weight:      SpO2: 92% 93% 96% 98%    Intake/Output Summary (Last 24 hours) at 07/01/11 0948 Last data filed at 06/30/11 1600  Gross per 24 hour  Intake    240 ml  Output      0 ml  Net    240 ml    Weight change:   General: Alert, awake, oriented x3, in no acute distress. HEENT: No bruits, no goiter. Heart: Regular rate and rhythm, without murmurs, rubs, gallops. Lungs: Clear to auscultation bilaterally. Abdomen: Soft, nontender, nondistended, positive bowel sounds. Extremities: No clubbing cyanosis or edema with positive pedal pulses. Neuro: Grossly intact, nonfocal.    Lab Results: @LABTEST @  Micro: Recent Results (from the past 240 hour(s))  CULTURE, BLOOD (ROUTINE X 2)     Status: Normal (Preliminary result)   Collection Time   06/28/11  1:00 PM      Component Value Range Status Comment   Specimen Description BLOOD ARM RIGHT   Final    Special Requests BOTTLES DRAWN AEROBIC AND ANAEROBIC 10CC EACH   Final    Setup Time [id number]   Final    Culture     Final    Value:        BLOOD CULTURE RECEIVED NO GROWTH TO DATE CULTURE WILL BE HELD FOR 5 DAYS BEFORE ISSUING A FINAL NEGATIVE REPORT   Report Status PENDING   Incomplete   CULTURE, BLOOD (ROUTINE X 2)     Status: Normal (Preliminary result)   Collection Time   06/28/11  1:10 PM      Component Value Range Status Comment   Specimen Description BLOOD ARM RIGHT   Final    Special Requests     Final    Value: BOTTLES DRAWN AEROBIC AND ANAEROBIC 10CC BLUE 5CC RED   Setup Time [id number]   Final    Culture     Final    Value:        BLOOD CULTURE  RECEIVED NO GROWTH TO DATE CULTURE WILL BE HELD FOR 5 DAYS BEFORE ISSUING A FINAL NEGATIVE REPORT   Report Status PENDING   Incomplete   MRSA PCR SCREENING     Status: Normal   Collection Time   06/28/11  2:28 PM      Component Value Range Status Comment   MRSA by PCR NEGATIVE  NEGATIVE  Final     Studies/Results: Mr Brain Wo Contrast  06/30/2011  *RADIOLOGY REPORT*  Clinical Data: Possible seizure.  Recent head trauma.  Thoracic spine compression fracture.  MRI HEAD WITHOUT CONTRAST     IMPRESSION:  1.  No acute intracranial abnormality or focal lesion to explain seizures. 2.  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  This likely reflects the sequelae of chronic microvascular ischemia.  Alcohol abuse is also listed and the patient's problem last.  This could give a similar appearance.  Original Report Authenticated By: Resa Miner. MATTERN, M.D.   Mr Thoracic Spine W Wo Contrast  06/30/2011  *RADIOLOGY REPORT*  Clinical Data:  Pain.  Previous fever and leukocytosis.  History prostate cancer.  MRI THORACIC AND LUMBAR SPINE WITHOUT AND WITH CONTRAST   IMPRESSION: 1.  Greater than 50% compression fracture at T5 with slight retropulsion or retrolisthesis and focal kyphosis. 2.  Left greater than right foraminal stenosis at T5-6. 3.  Additional superior endplate fractures are present at T6, T2, and T3, to a lesser extent. 4.  Focal T1 shortening posterior to the T5-T6 and T7 vertebral bodies likely represents some element of hemorrhage.  There is no canal compromise. 5.  Bilateral pneumonia and effusions.    MRI LUMBAR SPINE  Findings:  IMPRESSION:  1.  Disc bulging and endplate osteophyte formation at L5-S1 results in mild right foraminal narrowing. 2.  Mild straightening of the normal lumbar lordosis. 3.  No other significant disc disease or stenosis.  Original Report Authenticated By: Resa Miner. MATTERN, M.D.   Mr Lumbar Spine W Wo Contrast  06/30/2011  *RADIOLOGY REPORT*  Clinical Data:   Pain.  Previous fever and leukocytosis.  History prostate cancer.  MRI THORACIC AND LUMBAR SPINE WITHOUT AND WITH CONTRAST  Technique:    IMPRESSION: 1.  Greater than 50% compression fracture at T5 with slight retropulsion or retrolisthesis and focal kyphosis. 2.  Left greater than right foraminal stenosis at T5-6. 3.  Additional superior endplate fractures are present at T6, T2, and T3, to a lesser extent. 4.  Focal T1 shortening posterior to the T5-T6 and T7 vertebral bodies likely represents some element of hemorrhage.  There is no canal compromise. 5.  Bilateral pneumonia and effusions.    MRI LUMBAR SPINE   MPRESSION:  1.  Disc bulging and endplate osteophyte formation at L5-S1 results in mild right foraminal narrowing. 2.  Mild straightening of the normal lumbar lordosis. 3.  No other significant disc disease or stenosis.  Original Report Authenticated By: Resa Miner. MATTERN, M.D.    Medications:    . amLODipine  10 mg Oral Daily  . demeclocycline  300 mg Oral Q12H  . folic acid  1 mg Oral Daily  . insulin aspart  0-9 Units Subcutaneous TID WC  . multivitamins ther. w/minerals  1 tablet Oral Daily  . nicotine  14 mg Transdermal Daily  . olmesartan  40 mg Oral Daily  . omega-3 acid ethyl esters  1 g Oral Daily  . piperacillin-tazobactam (ZOSYN)  IV  3.375 g Intravenous Q8H  . polyethylene glycol  17 g Oral Daily  . predniSONE  40 mg Oral 1 day or 1 dose  . thiamine  100 mg Oral Daily  . DISCONTD: azithromycin  500 mg Intravenous Q24H    Assessment: Principal Problem:  *Compression fracture of thoracic vertebra Active Problems:  Acute pain  HTN (hypertension)  History of prostate cancer  PNA (pneumonia)  Hyponatremia  Alcohol abuse  Alcohol withdrawal  Leukocytosis  Tobacco abuse  Alcohol withdrawal seizure   Plan:  1. Compression fracture of thoracic vertebra: Dr. Reather Littler office was consulted. The patient was seen by Dr. Elmyra Ricks. He recommended consideration of  kyphoplasty vs a brace and will consult IR radiology today. The patient's pain is moderate . Pain medications are being given judiciously, minimize narcotics due to confusion.  MRI shows greater than 50% compression fracture at the T5 level with slight retropulsion and focal kyphosis,   2. Pneumonia: The patient is coughing today. CT scan showed early pneumonia. Patient has had leukocytosis for several days. Now on zosyn. Now with low grade fever. We will discontinue Rocephin and  switch him to Zosyn. Follow cultures. We will switch him to levofloxacin tomorrow. 3. Hypertension: Blood pressure medication will be restarted. Because the patient has low sodium bowels stop the hydrochlorothiazide but will continue with the amlodipine and the Benicar. Stable . Increased his Norvasc to 10 mg.  4. Hyponatremia: This is could be secondary to hydrochlorothiazide and to Tribenzor and it could be compounded by alcoholism. Still present. May need to consider fluid restrictions. Will order 1500 ml/day fluid restriction. Start demeclocycline .  5. Alcohol abuse and withdraw: Patient reports he's been drinking since college. (3 oz daily) family reports its much heavier than that. Patient's abdomin is distended like that of an alcoholic. Will continue patient on step down unit . Patient is impulsive and quickly becomes hypoxic when he removes his oxygen. Frequently jumps out of bed.Confused requiring sitter . We will minimize his Ativan and his muscle relaxants, to see if his mentation is improved.  Ammonia level was within normal limits.   6. Alcohol withdraw seizure: We suspect the patient seized prior to admission. MRI brain was ordered but could not be completed secondary to the patient's pain and hypoxia. EEG i was negative   7. Leukocytosis: Of uncertain source (PNA v seizure v trauma) - continues. Continues to improve on Zosyn, will switch him to levofloxacin tomorrow. Will transfer him to telemetry today, physical  therapy evaluation is still pending.     LOS: 5 days   [facility] 07/01/2011, 9:48 AM

## 2011-07-02 LAB — CBC
Hemoglobin: 11.4 g/dL — ABNORMAL LOW (ref 13.0–17.0)
MCHC: 35.7 g/dL (ref 30.0–36.0)
Platelets: 359 10*3/uL (ref 150–400)
RDW: 11.9 % (ref 11.5–15.5)

## 2011-07-02 LAB — GLUCOSE, CAPILLARY: Glucose-Capillary: 160 mg/dL — ABNORMAL HIGH (ref 70–99)

## 2011-07-02 MED ORDER — AMLODIPINE BESYLATE 10 MG PO TABS: 10.0000 mg | ORAL_TABLET | Freq: Every day | ORAL | Status: DC

## 2011-07-02 MED ORDER — OLMESARTAN MEDOXOMIL 40 MG PO TABS: 40.0000 mg | ORAL_TABLET | Freq: Every day | ORAL | Status: DC

## 2011-07-02 MED ORDER — THIAMINE HCL 100 MG PO TABS: 100.0000 mg | ORAL_TABLET | Freq: Every day | ORAL | Status: AC

## 2011-07-02 MED ORDER — OXYCODONE HCL 5 MG PO TABS: 5.0000 mg | ORAL_TABLET | Freq: Four times a day (QID) | ORAL | Status: AC | PRN

## 2011-07-02 MED ORDER — DEMECLOCYCLINE HCL 150 MG PO TABS: 300.0000 mg | ORAL_TABLET | Freq: Two times a day (BID) | ORAL | Status: AC

## 2011-07-02 MED ORDER — FOLIC ACID 1 MG PO TABS: 1.0000 mg | ORAL_TABLET | Freq: Every day | ORAL | Status: AC

## 2011-07-02 NOTE — Progress Notes (Signed)
Occupational Therapy Evaluation Patient Details Name: Douglas Ballard MRN: [id number] DOB: [birth year] Today's Date: 07/02/2011  Problem List:  Patient Active Problem List  Diagnoses  . Compression fracture of thoracic vertebra  . Acute pain  . HTN (hypertension)  . History of prostate cancer  . PNA (pneumonia)  . Hyponatremia  . Alcohol abuse  . Alcohol withdrawal  . Leukocytosis  . Tobacco abuse  . Alcohol withdrawal seizure    Past Medical History:  Past Medical History  Diagnosis Date  . Hypertension   . Cancer     Prostate surgery  . Headache   . Pneumonia   . Diabetes mellitus   . Alcohol withdrawal 06/27/2011   Past Surgical History:  Past Surgical History  Procedure Date  . Prostate surgery 01/31/2011    OT Assessment/Plan/Recommendation OT Assessment Clinical Impression Statement: Pt going home today. OT provided education regarding safety with ADL activity OT Recommendation/Assessment: Patient does not need any further OT services OT Recommendation Equipment Recommended: None recommended by OT OT Goals    OT Evaluation Precautions/Restrictions  Precautions Precautions: Fall Precaution Comments: Balance somewhat decreased with gait Required Braces or Orthoses: No Restrictions Weight Bearing Restrictions: No Prior Functioning Home Living Receives Help From: Family Bathroom Shower/Tub: Walk-in Radio producer: Standard Prior Function Level of Independence: Independent with basic ADLs ADL ADL Lower Body Bathing: Performed;Supervision/safety Lower Body Dressing: Performed;Supervision/safety Where Assessed - Lower Body Dressing: Sit to stand from chair Toilet Transfer: Performed Toilet Transfer Equipment: Comfort height toilet;Grab bars Toileting - Clothing Manipulation: Performed;Supervision/safety Tub/Shower Transfer: Performed;Supervision/safety;Simulated Warden/ranger: Walk in shower Vision/Perception  Vision -  History Baseline Vision: No visual deficits Cognition Cognition Arousal/Alertness: Awake/alert Orientation Level: Oriented X4 Sensation/Coordination Sensation Stereognosis: Appears Intact Hot/Cold: Appears Intact Proprioception: Appears Intact Coordination Gross Motor Movements are Fluid and Coordinated: Yes Fine Motor Movements are Fluid and Coordinated: Yes Extremity Assessment   Mobility  Bed Mobility Bed Mobility: No Transfers Sit to Stand: 5: Supervision Stand to Sit: 5: Supervision Exercises   End of Session General Behavior During Session: Restless (pt noting very frustrated about not being D/C'd yet.  ) Cognition:  (pt and wife disagreeing about if they have rails on stairs)   Domenick Gong 07/02/2011, 11:52 AM

## 2011-07-02 NOTE — Progress Notes (Signed)
Physical Therapy Treatment Patient Details Name: Douglas Ballard MRN: [id number] DOB: 1950-10-25 Today's Date: 07/02/2011  PT Assessment/Plan  PT - Assessment/Plan PT Plan: Discharge plan remains appropriate PT Frequency: Min 3X/week Follow Up Recommendations: None Equipment Recommended: Kasandra Knudsen PT Goals  Acute Rehab PT Goals PT Transfer Goal: Sit to Stand/Stand to Sit - Progress: Met PT Goal: Ambulate - Progress: Met PT Goal: Up/Down Stairs - Progress: Met  PT Treatment Precautions/Restrictions  Precautions Precautions: Fall Precaution Comments: Balance somewhat decreased with gait Required Braces or Orthoses: No Restrictions Weight Bearing Restrictions: No Mobility (including Balance) Bed Mobility Bed Mobility: No Transfers Transfers: Yes Sit to Stand: 7: Independent;With upper extremity assist;From chair/3-in-1 Stand to Sit: 7: Independent;With upper extremity assist;To chair/3-in-1 Ambulation/Gait Ambulation/Gait: Yes Ambulation/Gait Assistance: 7: Independent Ambulation Distance (Feet): 250 Feet Assistive device: None Gait Pattern: Within Functional Limits Stairs: Yes Stairs Assistance: 5: Supervision Stairs Assistance Details (indicate cue type and reason): cues to slow down Stair Management Technique: No rails Number of Stairs: 4  Wheelchair Mobility Wheelchair Mobility: No  Dynamic Standing Balance Dynamic Standing - Comments: pt able to negotiate obstacles Independently and talking to PT without LOB Exercise    End of Session PT - End of Session Equipment Utilized During Treatment: Gait belt Activity Tolerance: Patient tolerated treatment well Patient left: in chair;with call bell in reach;with family/visitor present General Behavior During Session: Restless (pt noting very frustrated about not being D/C'd yet.  ) Cognition:  (pt and wife disagreeing about if they have rails on stairs)  [facility], [place] 07/02/2011, 9:54 AM

## 2011-07-02 NOTE — Discharge Summary (Signed)
Physician Discharge Summary  Douglas Ballard MRN: [id number] DOB/AGE: [birth year] 60 y.o.  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 Qty: 30 tablet, Refills: 2    demeclocycline (DECLOMYCIN) 150 MG tablet Take 2 tablets (300 mg total) by mouth every 12 (twelve) hours. Qty: 20 tablet, Refills: 0    folic acid (FOLVITE) 1 MG tablet Take 1 tablet (1 mg total) by mouth daily. Qty: 30 tablet, Refills: 2    olmesartan (BENICAR) 40 MG tablet Take 1 tablet (40 mg total) by mouth daily. Qty: 30 tablet, Refills: 2    oxyCODONE (OXY IR/ROXICODONE) 5 MG immediate release tablet Take 1 tablet (5 mg total) by mouth every 6 (six) hours as needed. Qty: 30 tablet, Refills: 0    thiamine 100 MG tablet Take 1 tablet (100 mg total) by mouth daily. Qty: 30 tablet, Refills: 0      CONTINUE these medications which have NOT CHANGED   Details  Multiple Vitamins-Minerals (MULTIVITAMINS THER. W/MINERALS) TABS Take 1 tablet by mouth daily.      Omega-3 Fatty Acids (FISH OIL CONCENTRATE PO) Take 1 capsule by mouth daily.      promethazine (PHENERGAN) 25 MG tablet Take 25 mg by mouth every 6 (six) hours as needed. For nausea       STOP taking these medications     ciprofloxacin (CIPRO) 250 MG tablet      Olmesartan-Amlodipine-HCTZ (TRIBENZOR) 40-5-12.5 MG TABS      Olmesartan-Amlodipine-HCTZ (TRIBENZOR) 40-5-25 MG TABS      tiZANidine (ZANAFLEX) 4 MG tablet      zolpidem (AMBIEN) 10 MG tablet             Consults: Dr. Milford Cage                 Interventional radiology   Significant Diagnostic Studies: Ct Head Wo Contrast  06/22/2011  *RADIOLOGY REPORT*  Clinical Data: [place] 9 days ago.  Headaches with nausea.  CT HEAD WITHOUT CONTRAST  Technique:  Contiguous axial images were obtained from the base of the skull through the vertex without contrast.  Comparison: None.  Findings: There is no evidence for acute infarction, intracranial hemorrhage, mass lesion, hydrocephalus, or extra-axial fluid. There is no atrophy or white matter disease.  There is no skull fracture or sutural diastasis.  The sinuses and mastoids are clear. Normal orbits.  IMPRESSION: Negative.  Original Report Authenticated By: Staci Righter, M.D.   Ct Angio Chest W/cm &/or Wo Cm  06/26/2011  *RADIOLOGY REPORT*     IMPRESSION:  1.  No evidence of thoracic aortic dissection.  An aberrant right subclavian artery is noted. 2.  No evidence of acute pulmonary embolism. 3.  Parenchymal opacities in both lower lobes posteriorly suspicious for developing pneumonia. 4.  Partial compression deformity of T5 vertebral body of uncertain age.  No retropulsion.  Correlate clinically.  Original Report Authenticated By: Joretta Bachelor, M.D.   Mr Brain Wo Contrast  06/30/2011   IMPRESSION:  1.  No acute  intracranial abnormality or focal lesion to explain seizures. 2.  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  This likely reflects the sequelae of chronic microvascular ischemia.  Alcohol abuse is also listed and the patient's problem last.  This could give a similar appearance.  Original Report Authenticated By: Resa Miner. MATTERN, M.D.   Mr Thoracic Spine W Wo Contrast  06/30/2011  *RADIOLOGY REPORT*  Clinical Data:  Pain.  Previous fever and leukocytosis.  History prostate cancer.  MRI THORACIC AND LUMBAR SPINE WITHOUT AND WITH CONTRAST    IMPRESSION: 1.  Greater than 50% compression fracture at T5 with slight retropulsion or  retrolisthesis and focal kyphosis. 2.  Left greater than right foraminal stenosis at T5-6. 3.  Additional superior endplate fractures are present at T6, T2, and T3, to a lesser extent. 4.  Focal T1 shortening posterior to the T5-T6 and T7 vertebral bodies likely represents some element of hemorrhage.  There is no canal compromise. 5.  Bilateral pneumonia and effusions.    MRI LUMBAR SPINE   MPRESSION:  1.  Disc bulging and endplate osteophyte formation at L5-S1 results in mild right foraminal narrowing. 2.  Mild straightening of the normal lumbar lordosis. 3.  No other significant disc disease or stenosis.  Original Report Authenticated By: Resa Miner. MATTERN, M.D.   Mr Lumbar Spine W Wo Contrast  06/30/2011  *RADIOLOGY REPORT*  Clinical Data:  Pain.  Previous fever and leukocytosis.  History prostate cancer.  MRI THORACIC AND LUMBAR SPINE WITHOUT AND WITH CONTRAST  Technique:  Multiplanar and multiecho pulse sequences of the thoracic and lumbar spine were obtained without and with intravenous contrast.  Contrast: 55mL MULTIHANCE GADOBENATE DIMEGLUMINE 529 MG/ML IV SOLN  Comparison:  CT of the chest 06/26/2011.  MRI THORACIC SPINE  F  IMPRESSION: 1.  Greater than 50% compression fracture at T5 with slight retropulsion or retrolisthesis and focal kyphosis. 2.  Left greater than right foraminal stenosis at T5-6. 3.  Additional superior endplate fractures are present at T6, T2, and T3, to a lesser extent. 4.  Focal T1 shortening posterior to the T5-T6 and T7 vertebral bodies likely represents some element of hemorrhage.  There is no canal compromise. 5.  Bilateral pneumonia and effusions.  MRI LUMBAR SPINE  Findings: Normal signal is present in the conus medullaris which terminates at L1, within normal limits.  Chronic end plate fatty changes are present at L5-S1.  There is loss of disc height. Marrow signal is otherwise normal.  Alignment is anatomic.  There is some straightening of the normal lumbar  lordosis.  The lumbar disc levels at L4-5 above are normal.  L5-S1:  Mild broad-based disc bulging is present.  The disc bulges into the inferior recess of both neural foramina.  There is some osteophyte formation on the right which contributes to mild right foraminal stenosis. The left foramen is patent.  The postcontrast images demonstrate no areas of pathologic enhancement.  IMPRESSION:  1.  Disc bulging and endplate osteophyte formation at L5-S1 results in mild right foraminal narrowing. 2.  Mild straightening of the normal lumbar lordosis. 3.  No other significant disc disease or stenosis.  Original Report Authenticated By: Resa Miner. MATTERN, M.D.   Dg Chest Portable 1 View  06/28/2011  *RADIOLOGY REPORT*  Clinical Data: Pneumonia, shortness of breath, follow up  PORTABLE CHEST - 1 VIEW  Comparison: Portable exam 1000 hours compared to 06/26/2011  Findings: Upper normal heart size. Stable mediastinal contours. Bilateral pulmonary infiltrates increased since previous exam.  Infiltrates appear greatest at right lower lobe. No gross pleural effusion or pneumothorax.  IMPRESSION: Increased bilateral pulmonary infiltrates.  Original Report Authenticated By: Burnetta Sabin, M.D.   Dg Chest Portable 1 View  06/26/2011  *RADIOLOGY REPORT*    IMPRESSION:  1. Prominent markings at the lung bases.  Possible atelectasis but cannot exclude pneumonia. 2.  Cardiomegaly and question of mild pulmonary vascular congestion.  Original Report Authenticated By: Joretta Bachelor, M.D.      Microbiology: Recent Results (from the past 240 hour(s))  CULTURE, BLOOD (ROUTINE X 2)     Status: Normal (Preliminary result)   Collection Time   06/28/11  1:00 PM      Component Value Range Status Comment   Specimen Description BLOOD ARM RIGHT   Final    Special Requests BOTTLES DRAWN AEROBIC AND ANAEROBIC 10CC EACH   Final    Setup Time [id number]   Final    Culture     Final    Value:        BLOOD CULTURE RECEIVED NO GROWTH  TO DATE CULTURE WILL BE HELD FOR 5 DAYS BEFORE ISSUING A FINAL NEGATIVE REPORT   Report Status PENDING   Incomplete   CULTURE, BLOOD (ROUTINE X 2)     Status: Normal (Preliminary result)   Collection Time   06/28/11  1:10 PM      Component Value Range Status Comment   Specimen Description BLOOD ARM RIGHT   Final    Special Requests     Final    Value: BOTTLES DRAWN AEROBIC AND ANAEROBIC 10CC BLUE 5CC RED   Setup Time [id number]   Final    Culture     Final    Value:        BLOOD CULTURE RECEIVED NO GROWTH TO DATE CULTURE WILL BE HELD FOR 5 DAYS BEFORE ISSUING A FINAL NEGATIVE REPORT   Report Status PENDING   Incomplete   MRSA PCR SCREENING     Status: Normal   Collection Time   06/28/11  2:28 PM      Component Value Range Status Comment   MRSA by PCR NEGATIVE  NEGATIVE  Final      Labs: Results for orders placed during the hospital encounter of 06/26/11 (from the past 48 hour(s))  AMMONIA     Status: Normal   Collection Time   06/30/11 12:04 PM      Component Value Range Comment   Ammonia 25  11 - 60 (umol/L)   GLUCOSE, CAPILLARY     Status: Abnormal   Collection Time   06/30/11 12:18 PM      Component Value Range Comment   Glucose-Capillary 193 (*) 70 - 99 (mg/dL)    Comment 1 Notify RN     GLUCOSE, CAPILLARY     Status: Abnormal   Collection Time   06/30/11  5:29 PM      Component Value Range Comment   Glucose-Capillary 216 (*) 70 - 99 (mg/dL)    Comment 1 Notify RN     GLUCOSE, CAPILLARY     Status: Abnormal   Collection Time   06/30/11 10:19 PM      Component Value Range Comment   Glucose-Capillary 193 (*) 70 - 99 (mg/dL)    Comment 1 Documented in Chart      Comment 2 Notify RN     GLUCOSE, CAPILLARY     Status: Abnormal   Collection Time   07/01/11  7:59 AM  Component Value Range Comment   Glucose-Capillary 119 (*) 70 - 99 (mg/dL)    Comment 1 Notify RN     COMPREHENSIVE METABOLIC PANEL     Status: Abnormal   Collection Time   07/01/11  8:32 AM       Component Value Range Comment   Sodium 126 (*) 135 - 145 (mEq/L)    Potassium 3.6  3.5 - 5.1 (mEq/L)    Chloride 85 (*) 96 - 112 (mEq/L)    CO2 28  19 - 32 (mEq/L)    Glucose, Bld 108 (*) 70 - 99 (mg/dL)    BUN 12  6 - 23 (mg/dL)    Creatinine, Ser 0.76  0.50 - 1.35 (mg/dL)    Calcium 9.9  8.4 - 10.5 (mg/dL)    Total Protein 7.3  6.0 - 8.3 (g/dL)    Albumin 2.9 (*) 3.5 - 5.2 (g/dL)    AST 17  0 - 37 (U/L)    ALT 14  0 - 53 (U/L)    Alkaline Phosphatase 63  39 - 117 (U/L)    Total Bilirubin 0.6  0.3 - 1.2 (mg/dL)    GFR calc non Af Amer >90  >90 (mL/min)    GFR calc Af Amer >90  >90 (mL/min)   GLUCOSE, CAPILLARY     Status: Abnormal   Collection Time   07/01/11  1:21 PM      Component Value Range Comment   Glucose-Capillary 191 (*) 70 - 99 (mg/dL)    Comment 1 Notify RN     GLUCOSE, CAPILLARY     Status: Abnormal   Collection Time   07/01/11  4:29 PM      Component Value Range Comment   Glucose-Capillary 184 (*) 70 - 99 (mg/dL)    Comment 1 Documented in Chart      Comment 2 Notify RN     GLUCOSE, CAPILLARY     Status: Abnormal   Collection Time   07/01/11  8:57 PM      Component Value Range Comment   Glucose-Capillary 276 (*) 70 - 99 (mg/dL)   GLUCOSE, CAPILLARY     Status: Abnormal   Collection Time   07/02/11  6:00 AM      Component Value Range Comment   Glucose-Capillary 160 (*) 70 - 99 (mg/dL)   CBC     Status: Abnormal   Collection Time   07/02/11  6:38 AM      Component Value Range Comment   WBC 11.6 (*) 4.0 - 10.5 (K/uL)    RBC 3.48 (*) 4.22 - 5.81 (MIL/uL)    Hemoglobin 11.4 (*) 13.0 - 17.0 (g/dL)    HCT 31.9 (*) 39.0 - 52.0 (%)    MCV 91.7  78.0 - 100.0 (fL)    MCH 32.8  26.0 - 34.0 (pg)    MCHC 35.7  30.0 - 36.0 (g/dL)    RDW 11.9  11.5 - 15.5 (%)    Platelets 359  150 - 400 (K/uL)      HPI :Raquel Vanson is a 60 y.o. male with his past medical history of prostate cancer and hypertension. Patient brought to the hospital by his wife after he was found  confused this morning. He was doing fine last night on 11/ 6 she she did find him in the bathroom confused in the morning he was not answering her questions. She called EMS and patient brought to the hospital for further evaluation. At the time of admission  patient was mentioning about severe back pain and his blood pressure was in the high side with systolic blood pressure [id number]. There was good there was concern about dissect dissecting aneurysm so CT angiogram of the chest was done which showed no intra-thoracic process. But he did show T5 compression fracture of undetermined age. His blood work also showed hyponatremia with sodium 121. Patient will be admitted to the hospital for further evaluation.   HOSPITAL COURSE:  . Compression fracture of thoracic vertebra: Dr. Reather Littler office was consulted. The patient was seen by Dr. Elmyra Ricks. He recommended consideration of kyphoplasty vs a brace and will consult IR radiology today. The patient's pain is moderate . Pain medications are being given judiciously, minimize narcotics due to confusion.   Marland Kitchen thoracic and lumbar spine MRI was done and with results as mentioned above,. Dr. Rolena Infante saw  the patient on November 11 and stated that there was no evidence of any cord compression. The patient had a stable fracture of the thoracic spine there was no evidence of any neurologic deficits. His pain was well-controlled with oral medications and the patient was ambulatory. His rib cage is intact and this will happen from an internal brace to maintain stability of the spine the patient and the wife are in agreement with this plan. They would need to followup with [facility] orthopedics in 2-3 weeks for repeat x-rays. 2. Pneumonia: The patient is coughing today. CT scan showed early pneumonia. Patient has had leukocytosis for several days.  She was placed on vancomycin and Zosyn for healthcare associated pneumonia.Marland Kitchen His leukocytosis and fever resolved completely. His cultures  remain negative. He is afebrile at this time.   3. Hypertension: Blood pressure medication will be restarted. Because the patient has low sodium levels stop the hydrochlorothiazide but will continue with the amlodipine and the Benicar. Stable . Increased his Norvasc to 10 mg.  4. Hyponatremia: This is could be secondary to hydrochlorothiazide and to Tribenzor and it could be compounded by alcoholism. Still present. May need to consider fluid restrictions.   . He has been started on demeclocycline for this and will continue this for another 10 days. 5. Alcohol abuse and withdraw: Patient reports he's been drinking since college. (3 oz daily) family reports its much heavier than that. Patient's abdomin is distended like that of an alcoholic. Will continue patient on step down unit .  He was found to have delirium tremens and significant amount of confusion, and had to be monitored in a step down unit for the possibility of aspiration pneumonia/hypoxia/follow risk/confusion. The patient narcotics and benzodiazepines were minimized. Currently the patient has no signs and symptoms of withdrawal but continues to have significant memory deficits. 6. Alcohol withdraw seizure: We suspect the patient seized prior to admission. MRI brain was  consistent with changes related to his alcohol use. His EEG was negative   7. Leukocytosis: Of uncertain source (PNA v seizure v trauma) - continues. This continued to improve during the course of her hospitalization and was attribute to his ammonia and aspiration.      Discharge Exam:  Blood pressure 148/81, pulse 89, temperature 97.7 F (36.5 C), temperature source Oral, resp. rate 20, height 5\' 7"  (1.702 m), weight 76 kg (167 lb 8.8 oz), SpO2 96.00%.  General: Alert, awake, oriented x3, in no acute distress. HEENT: No bruits, no goiter. Heart: Regular rate and rhythm, without murmurs, rubs, gallops. Lungs: Clear to auscultation bilaterally. Abdomen: Soft,  nontender, nondistended, positive bowel sounds. Extremities: No clubbing  cyanosis or edema with positive pedal pulses. Neuro: Grossly intact, nonfocal.     Discharge Orders    Future Orders Please Complete By Expires   Ambulatory referral to Physical Therapy      Diet - low sodium heart healthy      Increase activity slowly         Follow-up Information    Follow up with ALUISIO,FRANK V in 4 weeks.   Contact information:   [facility] [address] [place] [id number]         Followup with Dr. Juanita Craver in 5-7 days Signed: Reyne Dumas 07/02/2011, 10:10 AM

## 2011-07-04 LAB — CULTURE, BLOOD (ROUTINE X 2)
Culture  Setup Time: 201211081630
Culture: NO GROWTH
Culture: NO GROWTH

## 2012-06-09 IMAGING — CT CT ANGIO CHEST
2 of 8 series · 11 of 36 positions shown · IV contrast (agent unspecified)
Comparison: Chest x-ray of 06/26/2011

CLINICAL DATA: Severe mid to upper back pain for several days,
evaluate for dissection

CT ANGIOGRAPHY CHEST WITH CONTRAST
TECHNIQUE: Multidetector CT imaging of the chest was performed
using the standard protocol during bolus administration of
intravenous contrast.  Multiplanar CT image reconstructions
including MIPs were obtained to evaluate the vascular anatomy.
Contrast:  100 ml Mmnipaque-USS

[Series 5: taa · axial · 0.68mm/px · z∈[-291,-37]mm · 10 of 157 slices shown]
[im 15/157  lung]
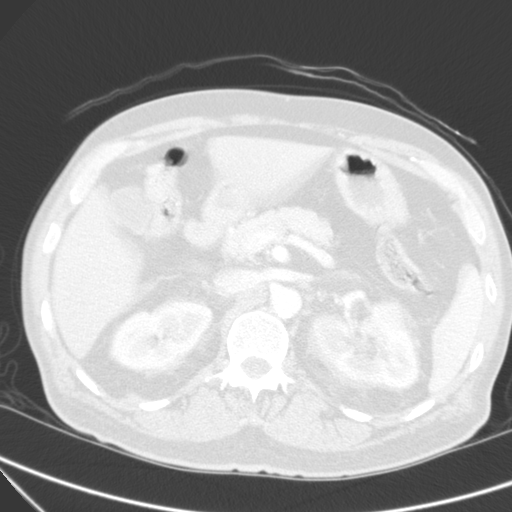
[im 29/157  mediastinal]
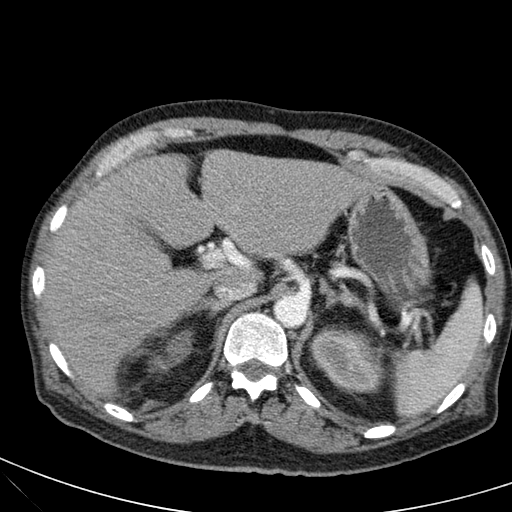
[im 43/157  lung]
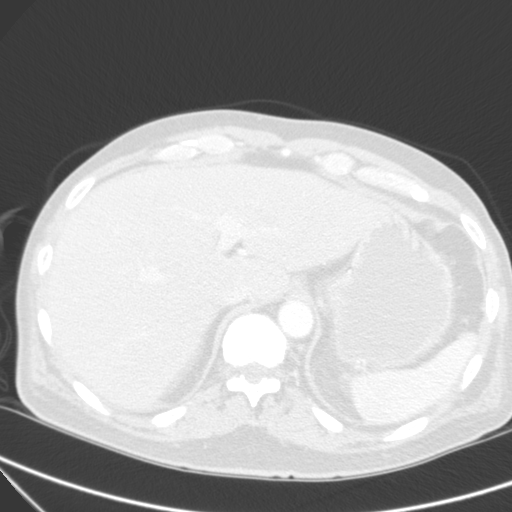
[im 57/157  mediastinal]
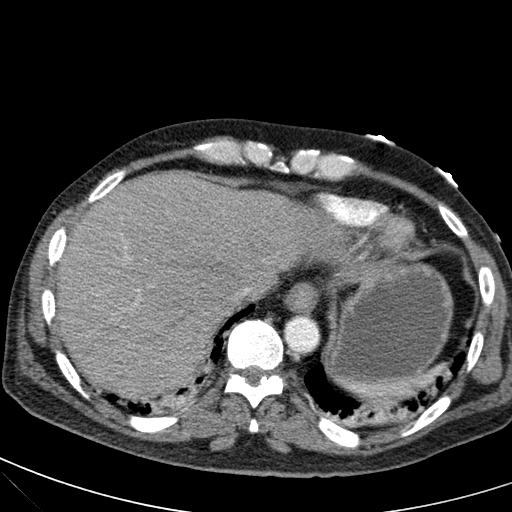
[im 71/157  lung]
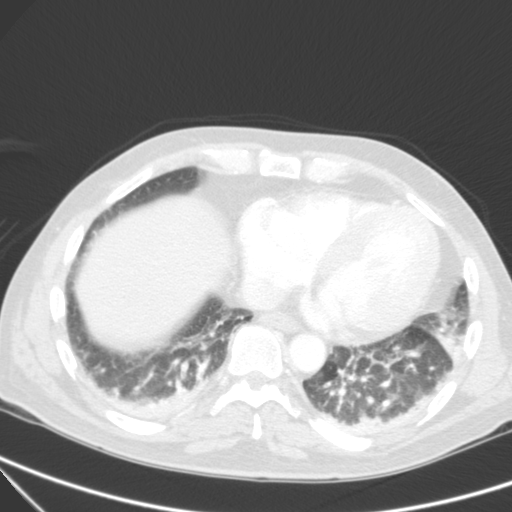
[im 86/157  mediastinal]
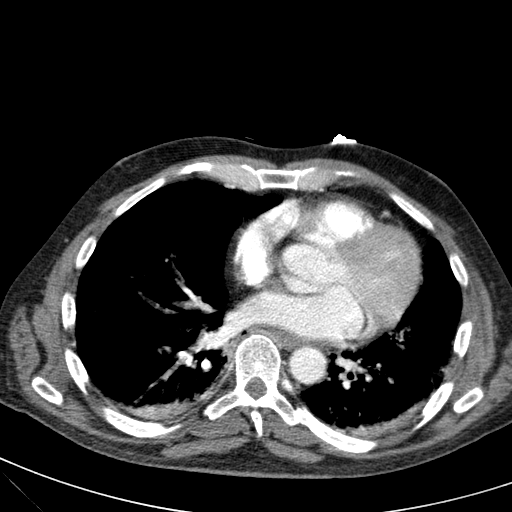
[im 100/157  lung]
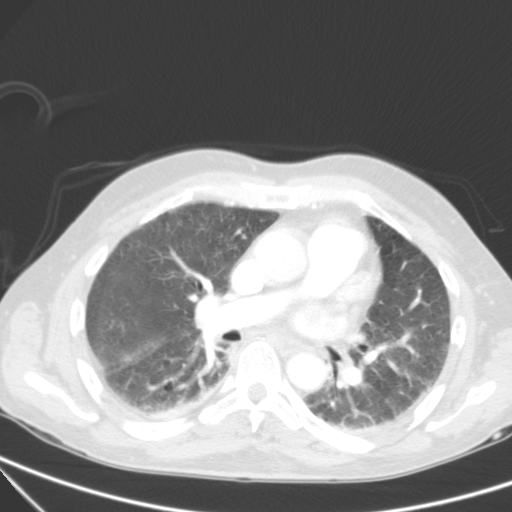
[im 114/157  mediastinal]
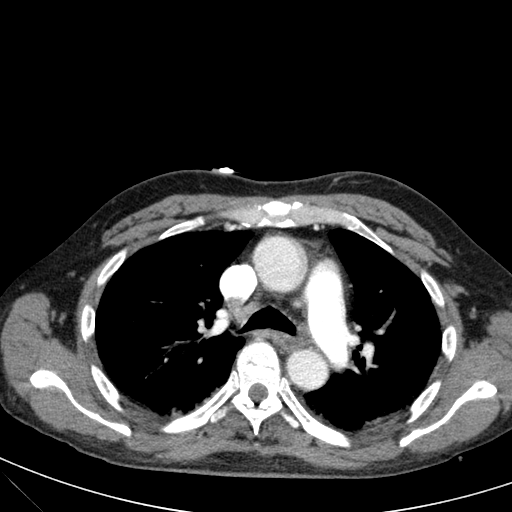
[im 128/157  lung]
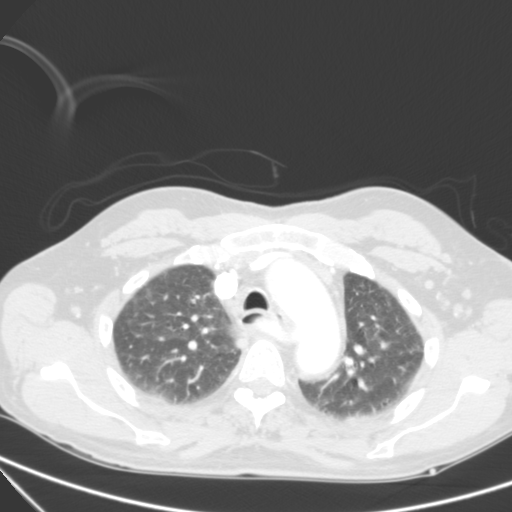
[im 142/157  mediastinal]
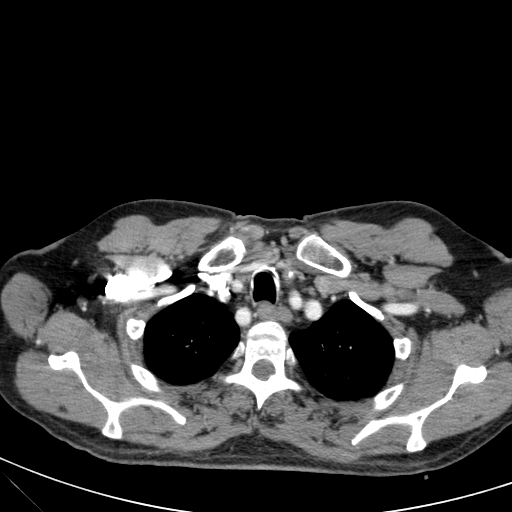

[mpr, coronal mpr, coronal · coronal · 0.68mm/px · 1 of 119 slices shown]
[im 60/119  mediastinal]
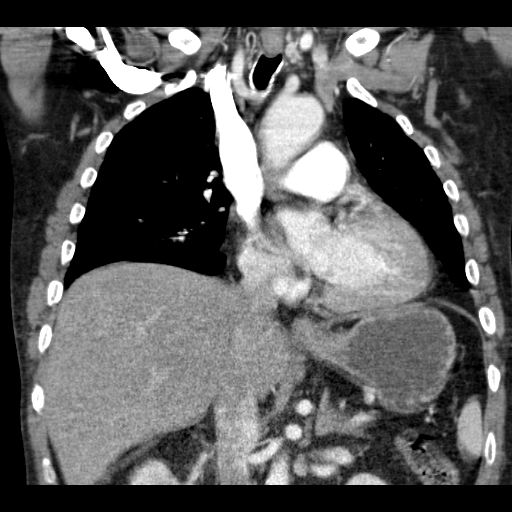

[11 of 36 positions shown; findings below may reference images not displayed]

FINDINGS: The pulmonary arteries opacify well and there is no
evidence of acute pulmonary embolism.  The thoracic aorta opacifies
with no evidence of dissection.  There is a variation of an
aberrant right subclavian artery coursing posterior to the
esophagus.  The origins of the great vessels are patent.  The
thyroid gland is unremarkable.  No mediastinal or hilar adenopathy
is seen.  The heart is mildly enlarged.  No abnormality of the
upper abdomen is noted.

On the lung window images, there is parenchymal opacity in both
lower lobes.  Some of this opacity may be due to atelectasis, but
developing pneumonia is a definite consideration.  No pleural
effusion is seen.  Mild centrilobular emphysema is present.

On bone window images there is partial compression deformity of T5
vertebral body of uncertain age.  No retropulsion is seen.
Clinical correlation is recommended.

Review of the MIP images confirms the above findings.
IMPRESSION: 1.  No evidence of thoracic aortic dissection.  An aberrant right
subclavian artery is noted.
2.  No evidence of acute pulmonary embolism.
3.  Parenchymal opacities in both lower lobes posteriorly
suspicious for developing pneumonia.
4.  Partial compression deformity of T5 vertebral body of uncertain
age.  No retropulsion.  Correlate clinically.

## 2012-11-13 ENCOUNTER — Other Ambulatory Visit: Payer: Self-pay | Admitting: Physician Assistant

## 2014-02-01 ENCOUNTER — Other Ambulatory Visit: Payer: Self-pay | Admitting: Internal Medicine

## 2014-02-01 DIAGNOSIS — I10 Essential (primary) hypertension: Secondary | ICD-10-CM

## 2014-02-12 ENCOUNTER — Ambulatory Visit
Admission: RE | Admit: 2014-02-12 | Discharge: 2014-02-12 | Disposition: A | Discharge status: Home / Self Care | Payer: BC Managed Care – PPO | Source: Ambulatory Visit | Attending: Internal Medicine | Admitting: Internal Medicine

## 2014-02-12 ENCOUNTER — Encounter (INDEPENDENT_AMBULATORY_CARE_PROVIDER_SITE_OTHER): Payer: Self-pay

## 2014-02-12 DIAGNOSIS — I10 Essential (primary) hypertension: Secondary | ICD-10-CM

## 2014-03-18 ENCOUNTER — Telehealth (INDEPENDENT_AMBULATORY_CARE_PROVIDER_SITE_OTHER): Payer: Self-pay

## 2014-03-18 NOTE — Telephone Encounter (Signed)
LMOM at home and cell. I need for pt to bring a copy of the CT on CD that is mentioned in [facility] notes from Dr Risa Grill. I will need a copy of the written report for the CT as well.

## 2014-03-24 ENCOUNTER — Ambulatory Visit (INDEPENDENT_AMBULATORY_CARE_PROVIDER_SITE_OTHER): Payer: BC Managed Care – PPO | Admitting: Surgery

## 2014-03-24 ENCOUNTER — Encounter (INDEPENDENT_AMBULATORY_CARE_PROVIDER_SITE_OTHER): Payer: Self-pay | Admitting: Surgery

## 2014-03-24 VITALS — BP 128/70 | HR 76 | Temp 98.0°F | Ht 69.0 in | Wt 177.0 lb

## 2014-03-24 DIAGNOSIS — N321 Vesicointestinal fistula: Secondary | ICD-10-CM

## 2014-03-24 DIAGNOSIS — Z86010 Personal history of colonic polyps: Secondary | ICD-10-CM

## 2014-03-24 DIAGNOSIS — Z720 Tobacco use: Secondary | ICD-10-CM

## 2014-03-24 DIAGNOSIS — F17200 Nicotine dependence, unspecified, uncomplicated: Secondary | ICD-10-CM

## 2014-03-24 DIAGNOSIS — N368 Other specified disorders of urethra: Secondary | ICD-10-CM

## 2014-03-24 DIAGNOSIS — Z8546 Personal history of malignant neoplasm of prostate: Secondary | ICD-10-CM

## 2014-03-24 DIAGNOSIS — R3989 Other symptoms and signs involving the genitourinary system: Secondary | ICD-10-CM | POA: Insufficient documentation

## 2014-03-24 MED ORDER — NEOMYCIN SULFATE 500 MG PO TABS: 1000.0000 mg | ORAL_TABLET | ORAL | Status: DC

## 2014-03-24 MED ORDER — METRONIDAZOLE 500 MG PO TABS: 500.0000 mg | ORAL_TABLET | ORAL | Status: DC

## 2014-03-24 NOTE — Progress Notes (Signed)
Subjective:     Patient ID: Douglas Ballard, male   DOB: March 24, 1951, 63 y.o.   MRN: [id number]  HPI  Note: Portions of this report may have been transcribed using voice recognition software. Every effort was made to ensure accuracy; however, inadvertent computerized transcription errors may be present.   Any transcriptional errors that result from this process are unintentional.            Kaveer Carozza  May 31, 1951 [id number]  Patient Care Team: Stephens Shire, MD as PCP - General (Family Medicine) Bernestine Amass, MD as Consulting Physician (Urology) [name], MD as Consulting Physician (General Surgery) Gearlean Alf, MD as Consulting Physician (Orthopedic Surgery)  This patient is a 63 y.o.male who presents today for surgical evaluation at the request of Dr. Risa Grill.   Reason for visit: Colovesicular fistula.  Pleasant smoking male.  Began to notice passing air with urination 4 months ago.  Never had this before.  No history of Crohn's.  Did have a prostatectomy 3 years ago robotically for prostate cancer.  No radiation.  Relatively early stage.  Negative lymph nodes.  Saw his urologist.  Cystoscopy done.  Inflammation at the left dome of the bladder.  CT scan showing gas in bladder and colon nearby with diverticulosis.  Very suspicious for colovesicular fistula.  Surgical consultation recommended.  His wife and had surgery by Dr. Donella Stade.  They were interested in seeing him.  Given the complexity of the case, patient sent to me.  Patient recalls colonoscopy over 5 years ago in town.  Thinks it was Dr. Juanita Craver.  Due for follow colonoscopy but wishes a different gastroenterologists.  Recalls a colonoscopy when he lived in [place] 10 years ago.  He thinks a polyp has been removed in the past.  Normally has a bowel movement twice a day.  Can walk 30-60 minutes without difficulty.  The smoke 2 packs a day.  No history of heart or strokes or heart attacks.  Not on  oxygen.  No personal nor family history of GI/colon cancer, inflammatory bowel disease, irritable bowel syndrome, allergy such as Celiac Sprue, dietary/dairy problems, colitis, ulcers nor gastritis.  No recent sick contacts/gastroenteritis.  No travel outside the country.  No changes in diet.  No dysphagia to solids or liquids.  No significant heartburn or reflux.  No hematochezia, hematemesis, coffee ground emesis.  No evidence of prior gastric/peptic ulceration.    Patient Active Problem List   Diagnosis Date Noted  . 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 Alcohol abuse 06/27/2011  . 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 Leukocytosis 06/27/2011  . Tobacco abuse 06/27/2011  . Compression fracture of thoracic vertebra 06/26/2011  . Acute pain 06/26/2011  . HTN (hypertension) 06/26/2011  . Personal history of prostate cancer s/p prostatectomy 2012 06/26/2011  . PNA (pneumonia) 06/26/2011  . Hyponatremia 06/26/2011    Past Medical History  Diagnosis Date  . Hypertension   . Cancer     Prostate surgery  . Headache(784.0)   . Pneumonia   . Diabetes mellitus   . Alcohol withdrawal 06/27/2011    Past Surgical History  Procedure Laterality Date  . Robot assisted laparoscopic radical prostatectomy  01/31/2011    Robotic-assisted laparoscopic radical retropubic  . Lipoma excision  2012    Dr Nonah Mattes    History   Social History  . Marital Status: Married    Spouse Name: N/A    Number of Children: N/A  .  Years of Education: N/A   Occupational History  . Not on file.   Social History Main Topics  . Smoking status: Current Every Day Smoker    Types: Cigarettes  . Smokeless tobacco: Not on file  . Alcohol Use: Yes     Comment: vodka dailey  . Drug Use: No  . Sexual Activity:    Other Topics Concern  . Not on file   Social History Narrative  . No narrative on file    History reviewed. No pertinent family history.  Current  Outpatient Prescriptions  Medication Sig Dispense Refill  . atenolol (TENORMIN) 100 MG tablet       . doxazosin (CARDURA) 4 MG tablet       . furosemide (LASIX) 20 MG tablet       . Multiple Vitamins-Minerals (MULTIVITAMINS THER. W/MINERALS) TABS Take 1 tablet by mouth daily.        . Omega-3 Fatty Acids (FISH OIL CONCENTRATE PO) Take 1 capsule by mouth daily.        . promethazine (PHENERGAN) 25 MG tablet Take 25 mg by mouth every 6 (six) hours as needed. For nausea       . zolpidem (AMBIEN) 5 MG tablet       . amLODipine (NORVASC) 10 MG tablet Take 1 tablet (10 mg total) by mouth daily.  30 tablet  2  . olmesartan (BENICAR) 40 MG tablet Take 1 tablet (40 mg total) by mouth daily.  30 tablet  2   No current facility-administered medications for this visit.     Allergies  Allergen Reactions  . Sulfa Antibiotics Other (See Comments)    headaches    BP 128/70  Pulse 76  Temp(Src) 98 F (36.7 C)  Ht 5\' 9"  (1.753 m)  Wt 177 lb (80.287 kg)  BMI 26.13 kg/m2  No results found.   Review of Systems  Constitutional: Negative for fever, chills and diaphoresis.  HENT: Negative for ear discharge, facial swelling, mouth sores, nosebleeds, sore throat and trouble swallowing.   Eyes: Negative for photophobia, discharge and visual disturbance.  Respiratory: Negative for choking, chest tightness, shortness of breath and stridor.   Cardiovascular: Negative for chest pain and palpitations.  Gastrointestinal: Negative for nausea, vomiting, abdominal pain, diarrhea, constipation, blood in stool, abdominal distention, anal bleeding and rectal pain.  Endocrine: Negative for cold intolerance and heat intolerance.  Genitourinary: Negative for dysuria, urgency, difficulty urinating and testicular pain.  Musculoskeletal: Negative for arthralgias, back pain, gait problem and myalgias.  Skin: Negative for color change, pallor, rash and wound.  Allergic/Immunologic: Negative for environmental allergies  and food allergies.  Neurological: Negative for dizziness, speech difficulty, weakness, numbness and headaches.  Hematological: Negative for adenopathy. Does not bruise/bleed easily.  Psychiatric/Behavioral: Negative for hallucinations, confusion and agitation.       Objective:   Physical Exam  Constitutional: He is oriented to person, place, and time. He appears well-developed and well-nourished. No distress.  HENT:  Head: Normocephalic.  Mouth/Throat: Oropharynx is clear and moist. No oropharyngeal exudate.  Eyes: Conjunctivae and EOM are normal. Pupils are equal, round, and reactive to light. No scleral icterus.  Neck: Normal range of motion. Neck supple. No tracheal deviation present.  Cardiovascular: Normal rate, regular rhythm and intact distal pulses.   Pulmonary/Chest: Effort normal and breath sounds normal. No respiratory distress.  Abdominal: Soft. He exhibits no distension. There is no tenderness. Hernia confirmed negative in the right inguinal area and confirmed negative in the left inguinal area.  Musculoskeletal: Normal range of motion. He exhibits no tenderness.  Lymphadenopathy:    He has no cervical adenopathy.       Right: No inguinal adenopathy present.       Left: No inguinal adenopathy present.  Neurological: He is alert and oriented to person, place, and time. No cranial nerve deficit. He exhibits normal muscle tone. Coordination normal.  Skin: Skin is warm and dry. No rash noted. He is not diaphoretic. No erythema. No pallor.  Psychiatric: He has a normal mood and affect. His behavior is normal. Judgment and thought content normal.       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  Patient history of colon polyps due for colonoscopy.     Plan:     I think he would benefit from segmental colonic resection of the sigmoid colon as that is the most likely source of this fistula.  Most likely is due to  diverticulitis/osis.  The lack of a prior attacks gives me a little pause.  Reasonable candidate for minimally invasive approach.  The fistula seems to be the left dome of the bladder.  Most likely will just need a primary repair of dome of bladder and Foley for a week.  If intraoperatively seems more complicated, may require urological consultation/intraoperative help.  They seem happy with having me as the urgeon.   patient & wife are interested in it robotic approach:  The anatomy & physiology of the digestive tract was discussed.  The pathophysiology of  fistula between the bowel and bladder was discussed.  Natural history risks without surgery was discussed. I worked to give an overview of the disease and the frequent need to have multispecialty involvement.   I feel the risks of no intervention will lead to serious problems that outweigh the operative risks; therefore, I recommended surgery to treat the pathology.  Laparoscopic & open techniques for partial proctocolectomy with bladder repair were discussed.  Possible fecal diversion by ostomy was discussed.  We will work to preserve anal & pelvic floor function without sacrificing cure.  Need for prolonged bladder catheterization was discussed.  Risks such as bleeding, infection, abscess, leak, recurrence with reoperation, possible ostomy, hernia, heart attack, death, and other risks were discussed.  I noted a good likelihood this will help address the problem.   Goals of post-operative recovery were discussed as well.  We will work to minimize complications.  An educational handout on the pathology was given as well.  Questions were answered.    The patient expresses understanding & wishes to proceed with surgery.  I think he would benefit from preoperative colonoscopy to major there is no cancer tumor or other surprises.  He is due for what anyway.  They would prefer someone besides Dr. Collene Mares.  We will try and set up with [facility]  which is preferred to them.  We then transected it up so as the day before surgery.  I strongly recommend that he STOP SMOKING! We talked to the patient about the dangers of smoking.  We stressed that tobacco use dramatically increases the risk of peri-operative complications such as infection, tissue necrosis leaving to problems with incision/wound and organ healing, hernia, chronic pain, heart attack, stroke, DVT, pulmonary embolism, and death.  We noted there are programs in our community to help stop smoking.  Information was available.  He seemed very open the idea of quitting to minimize complications.

## 2014-03-24 NOTE — Patient Instructions (Addendum)
STOP SMOKING!  We strongly recommend that you stop smoking.  Smoking increases the risk of surgery including infection in the form of an open wound, pus formation, abscess, hernia at an incision on the abdomen, etc.  You have an increased risk of other MAJOR complications such as stroke, heart attack, forming clots in the leg and/or lungs, and death.    Smoking Cessation Quitting smoking is important to your health and has many advantages. However, it is not always easy to quit since nicotine is a very addictive drug. Often times, people try 3 times or more before being able to quit. This document explains the best ways for you to prepare to quit smoking. Quitting takes hard work and a lot of effort, but you can do it. ADVANTAGES OF QUITTING SMOKING  You will live longer, feel better, and live better.  Your body will feel the impact of quitting smoking almost immediately.  Within 20 minutes, blood pressure decreases. Your pulse returns to its normal level.  After 8 hours, carbon monoxide levels in the blood return to normal. Your oxygen level increases.  After 24 hours, the chance of having a heart attack starts to decrease. Your breath, hair, and body stop smelling like smoke.  After 48 hours, damaged nerve endings begin to recover. Your sense of taste and smell improve.  After 72 hours, the body is virtually free of nicotine. Your bronchial tubes relax and breathing becomes easier.  After 2 to 12 weeks, lungs can hold more air. Exercise becomes easier and circulation improves.  The risk of having a heart attack, stroke, cancer, or lung disease is greatly reduced.  After 1 year, the risk of coronary heart disease is cut in half.  After 5 years, the risk of stroke falls to the same as a nonsmoker.  After 10 years, the risk of lung cancer is cut in half and the risk of other cancers decreases significantly.  After 15 years, the risk of coronary heart disease drops, usually to the  level of a nonsmoker.  If you are pregnant, quitting smoking will improve your chances of having a healthy baby.  The people you live with, especially any children, will be healthier.  You will have extra money to spend on things other than cigarettes. QUESTIONS TO THINK ABOUT BEFORE ATTEMPTING TO QUIT You may want to talk about your answers with your caregiver.  Why do you want to quit?  If you tried to quit in the past, what helped and what did not?  What will be the most difficult situations for you after you quit? How will you plan to handle them?  Who can help you through the tough times? Your family? Friends? A caregiver?  What pleasures do you get from smoking? What ways can you still get pleasure if you quit? Here are some questions to ask your caregiver:  How can you help me to be successful at quitting?  What medicine do you think would be best for me and how should I take it?  What should I do if I need more help?  What is smoking withdrawal like? How can I get information on withdrawal? GET READY  Set a quit date.  Change your environment by getting rid of all cigarettes, ashtrays, matches, and lighters in your home, car, or work. Do not let people smoke in your home.  Review your past attempts to quit. Think about what worked and what did not. GET SUPPORT AND ENCOURAGEMENT You have  a better chance of being successful if you have help. You can get support in many ways.  Tell your family, friends, and co-workers that you are going to quit and need their support. Ask them not to smoke around you.  Get individual, group, or telephone counseling and support. Programs are available at [employer] and health centers. Call your local health department for information about programs in your area.  Spiritual beliefs and practices may help some smokers quit.  Download a "quit meter" on your computer to keep track of quit statistics, such as how long you have gone  without smoking, cigarettes not smoked, and money saved.  Get a self-help book about quitting smoking and staying off of tobacco. [place] yourself from urges to smoke. Talk to someone, go for a walk, or occupy your time with a task.  Change your normal routine. Take a different route to work. Drink tea instead of coffee. Eat breakfast in a different place.  Reduce your stress. Take a hot bath, exercise, or read a book.  Plan something enjoyable to do every day. Reward yourself for not smoking.  Explore interactive web-based programs that specialize in helping you quit. GET MEDICINE AND USE IT CORRECTLY Medicines can help you stop smoking and decrease the urge to smoke. Combining medicine with the above behavioral methods and support can greatly increase your chances of successfully quitting smoking.  Nicotine replacement therapy helps deliver nicotine to your body without the negative effects and risks of smoking. Nicotine replacement therapy includes nicotine gum, lozenges, inhalers, nasal sprays, and skin patches. Some may be available over-the-counter and others require a prescription.  Antidepressant medicine helps people abstain from smoking, but how this works is unknown. This medicine is available by prescription.  Nicotinic receptor partial agonist medicine simulates the effect of nicotine in your brain. This medicine is available by prescription. Ask your caregiver for advice about which medicines to use and how to use them based on your health history. Your caregiver will tell you what side effects to look out for if you choose to be on a medicine or therapy. Carefully read the information on the package. Do not use any other product containing nicotine while using a nicotine replacement product.  RELAPSE OR DIFFICULT SITUATIONS Most relapses occur within the first 3 months after quitting. Do not be discouraged if you start smoking again. Remember,  most people try several times before finally quitting. You may have symptoms of withdrawal because your body is used to nicotine. You may crave cigarettes, be irritable, feel very hungry, cough often, get headaches, or have difficulty concentrating. The withdrawal symptoms are only temporary. They are strongest when you first quit, but they will go away within 10 14 days. To reduce the chances of relapse, try to:  Avoid drinking alcohol. Drinking lowers your chances of successfully quitting.  Reduce the amount of caffeine you consume. Once you quit smoking, the amount of caffeine in your body increases and can give you symptoms, such as a rapid heartbeat, sweating, and anxiety.  Avoid smokers because they can make you want to smoke.  Do not let weight gain distract you. Many smokers will gain weight when they quit, usually less than 10 pounds. Eat a healthy diet and stay active. You can always lose the weight gained after you quit.  Find ways to improve your mood other than smoking. FOR MORE INFORMATION  www.smokefree.gov    While it can be one of  the most difficult things to do, the [place] community has programs to help you stop.  Consider talking with your primary care physician about options.  Also, Smoking Cessation classes are available through the [facility]:  The smoking cessation program is a proven-effective program from the American Lung Association. The program is available for anyone 44 and older who currently smokes. The program lasts for 7 weeks and is 8 sessions. Each class will be approximately 1 1/2 hours. The program is every Tuesday.  All classes are 12-1:30pm and same location.  Event Location Information:  Location: [place] 2nd Floor Conference Room 2-037; located next to [facility] cross streets: [place] Entrance into the [facility] is adjacent to the [employer] main  entrance. The conference room is located on the 2nd floor.  Parking Instructions: Visitor parking is adjacent to [employer] main entrance and the [place]    A smoking cessation program is also offered through the [facility]. Register online at [email or web address].gl or call [phone] for more information.   Tobacco cessation counseling is available at [facility]. Call [phone] for a free appointment.   Tobacco cessation classes also are available through the [facility] in [place]. For information, call [phone].   The Patient Education Network features videos on tobacco cessation. Please consult your listings in the center of this book to find instructions on how to access this resource.   If you want more information, ask your nurse.   Exercise to Stay Healthy Exercise helps you become and stay healthy. EXERCISE IDEAS AND TIPS Choose exercises that:  You enjoy.  Fit into your day. You do not need to exercise really hard to be healthy. You can do exercises at a slow or medium level and stay healthy. You can:  Stretch before and after working out.  Try yoga, Pilates, or tai chi.  Lift weights.  Walk fast, swim, jog, run, climb stairs, bicycle, dance, or rollerskate.  Take aerobic classes. Exercises that burn about 150 calories:  Running 1  miles in 15 minutes.  Playing volleyball for 45 to 60 minutes.  Washing and waxing a car for 45 to 60 minutes.  Playing touch football for 45 minutes.  Walking 1  miles in 35 minutes.  Pushing a stroller 1  miles in 30 minutes.  Playing basketball for 30 minutes.  Raking leaves for 30 minutes.  Bicycling 5 miles in 30 minutes.  Walking 2 miles in 30 minutes.  Dancing for 30 minutes.  Shoveling snow for 15 minutes.  Swimming laps for 20 minutes.  Walking up stairs for 15 minutes.  Bicycling 4 miles in 15 minutes.  Gardening for 30 to 45  minutes.  Jumping rope for 15 minutes.  Washing windows or floors for 45 to 60 minutes. Document Released: 09/08/2010 Document Revised: 10/29/2011 Document Reviewed: 09/08/2010 [facility] Patient Information 2015 [place], [place]. This information is not intended to replace advice given to you by your health care provider. Make sure you discuss any questions you have with your health care provider.   Diverticulosis Diverticulosis is the condition that develops when small pouches (diverticula) form in the wall of your colon. Your colon, or large intestine, is where water is absorbed and stool is formed. The pouches form when the inside layer of your colon pushes through weak spots in the outer layers of your colon. CAUSES  No  one knows exactly what causes diverticulosis. RISK FACTORS  Being older than 74. Your risk for this condition increases with age. Diverticulosis is rare in people younger than 40 years. By age 34, almost everyone has it.  Eating a low-fiber diet.  Being frequently constipated.  Being overweight.  Not getting enough exercise.  Smoking.  Taking over-the-counter pain medicines, like aspirin and ibuprofen. SYMPTOMS  Most people with diverticulosis do not have symptoms. DIAGNOSIS  Because diverticulosis often has no symptoms, health care providers often discover the condition during an exam for other colon problems. In many cases, a health care provider will diagnose diverticulosis while using a flexible scope to examine the colon (colonoscopy). TREATMENT  If you have never developed an infection related to diverticulosis, you may not need treatment. If you have had an infection before, treatment may include:  Eating more fruits, vegetables, and grains.  Taking a fiber supplement.  Taking a live bacteria supplement (probiotic).  Taking medicine to relax your colon. HOME CARE INSTRUCTIONS   Drink at least 6-8 glasses of water each day to prevent  constipation.  Try not to strain when you have a bowel movement.  Keep all follow-up appointments. If you have had an infection before:  Increase the fiber in your diet as directed by your health care provider or dietitian.  Take a dietary fiber supplement if your health care provider approves.  Only take medicines as directed by your health care provider. SEEK MEDICAL CARE IF:   You have abdominal pain.  You have bloating.  You have cramps.  You have not gone to the bathroom in 3 days. SEEK IMMEDIATE MEDICAL CARE IF:   Your pain gets worse.  Yourbloating becomes very bad.  You have a fever or chills, and your symptoms suddenly get worse.  You begin vomiting.  You have bowel movements that are bloody or black. MAKE SURE YOU:  Understand these instructions.  Will watch your condition.  Will get help right away if you are not doing well or get worse. Document Released: 05/03/2004 Document Revised: 08/11/2013 Document Reviewed: 07/01/2013 [facility] Patient Information 2015 [place], [place]. This information is not intended to replace advice given to you by your health care provider. Make sure you discuss any questions you have with your health care provider.  COLON BOWEL PREP                                                                          Please follow the instructions carefully. It is important to clean out your bowels & take the prescribed antibiotic pills to lower your chances of a wound infection or abscess.   FIVE DAYS PRIOR TO YOUR SURGERY  Stop eating any nuts, popcorn, or fruit with seeds. Stop all fiber supplements such as Metamucil, Citrucel, etc.   Hold taking any blood thinning anticoagulation medication (ex: aspirin, warfarin/Coumadin, Plavix, Xarelto, Eliquis, Pradaxa, etc) as recommended by your medical/cardiology doctor  Obtain what you need at a pharmacy of your choice: -Filled out prescriptions for your oral antibiotics (Neomycin &  Metronidazole)  -A bottle of MiraLax / Glycolax (288g) - no prescription required  -A large bottle of Gatorade / Powerade (64oz)  -Dulcolax tablets (4 tabs) - no prescription  required    DAY PRIOR TO SURGERY   7:00am Swallow 4 Dulcolax tablets with some water Drink plenty of clear liquids all day to avoid getting dehydrated (Water, juice, soda, coffee, tea, bouillon, jello, etc.)  10:00am Mix the bottle of MiraLax with the 64-oz bottle of Gatorade.  Drink the Gatorade mixture gradually over the next few hours (8oz glass every 15-30 minutes) until gone. You should finish by 2pm.  2:00pm Take 2 Neomycin 549m tablets & 2 Metronidazole 5076mtablets  3:00pm Take 2 Neomycin 50085mablets & 2 Metronidazole 500m74mblets  Drink plenty of clear liquids all evening to avoid getting dehydrated  10:00pm Take 2 Neomycin 500mg32mlets & 2 Metronidazole 500mg 7mets  Do not eat or drink anything after bedtime (midnight) the night before your surgery.   MORNING OF SURGERY Remember to not to drink or eat anything that morning  Hold or take medications as recommended by the hospital staff at your Preoperative visit  If you have questions or concerns, please call [facility] [phone] during business hours to speak to the clinical staff for advice.

## 2014-03-26 ENCOUNTER — Ambulatory Visit (INDEPENDENT_AMBULATORY_CARE_PROVIDER_SITE_OTHER): Payer: Self-pay | Admitting: Surgery

## 2014-03-26 NOTE — Addendum Note (Signed)
Addended by: Illene Regulus on: 03/26/2014 04:59 PM   Modules accepted: Orders

## 2014-03-30 ENCOUNTER — Telehealth (INDEPENDENT_AMBULATORY_CARE_PROVIDER_SITE_OTHER): Payer: Self-pay

## 2014-03-30 ENCOUNTER — Ambulatory Visit (INDEPENDENT_AMBULATORY_CARE_PROVIDER_SITE_OTHER): Payer: BC Managed Care – PPO | Admitting: Surgery

## 2014-03-30 NOTE — Telephone Encounter (Signed)
LMOM at home and then got pt to answer on his cell. I advised pt that I needed him to request all of his records from Dr Collene Mares to be sent to [facility] for them to review for an appt. I asked for the pt to call me once he receives his appt from [facility] so I can make sure we are all on the same page since there is 3 doctors we are going to be coordinating. Pt understands and will call Dr Lorie Apley office now to get records sent over.

## 2014-03-30 NOTE — Telephone Encounter (Signed)
Called to see about the epic referral that I placed on 8/7 but no appt made yet. I explained that this pt needs to see GI for a colonoscopy that needs to be scheduled the day before surgery by Dr Johney [place]. The pt last saw Dr Collene Mares about 5years a go. [facility] will not make the appt until they have the records from Dr Collene Mares b/c on of the their GI doctor's will have to review the notes to see about making an appt. They will contact the pt with the appt date and time. I asked for [employer] to contact me as well with the appt time b/c this case is going to be a coordination case with GI. The colonoscopy to be done the day before surgery so the pt only has to prep once the pt will go home to remain on clear liquids until the next day which will be surgery with Dr Johney [place] then coordinated with [facility].

## 2014-04-02 ENCOUNTER — Other Ambulatory Visit: Payer: Self-pay | Admitting: Physician Assistant

## 2014-04-05 NOTE — Telephone Encounter (Signed)
error 

## 2014-04-05 NOTE — Telephone Encounter (Signed)
Received records from Dr Lorie Apley office. I faxed all of the records to [facility] for them to review and make an appt for the pt. Faxed rec's to fx# [phone]. They will contact the pt with an appt once the records have been reviewed.

## 2014-04-05 NOTE — Telephone Encounter (Signed)
Returning pt's call. I advised pt that I see where there is an appt made with the PA Amy Rote with [facility] for 9/2. I advised pt that I did turn his surgical orders into coding today but I did speak with [facility] that when pt gets done with his appt that I need their scheduler to call our surgery scheduler Debbie Everheart to arrange the coordination case. I notified Debbie our surgery scheduler to be on the look out for this pt.

## 2014-04-14 ENCOUNTER — Emergency Department (HOSPITAL_COMMUNITY): Payer: BC Managed Care – PPO

## 2014-04-14 ENCOUNTER — Encounter (HOSPITAL_COMMUNITY): Payer: Self-pay | Admitting: Emergency Medicine

## 2014-04-14 ENCOUNTER — Inpatient Hospital Stay (HOSPITAL_COMMUNITY): Payer: BC Managed Care – PPO

## 2014-04-14 ENCOUNTER — Inpatient Hospital Stay (HOSPITAL_COMMUNITY)
Admission: EM | Admit: 2014-04-14 | Discharge: 2014-04-23 | DRG: 871 | Disposition: A | Discharge status: Home / Self Care | Payer: BC Managed Care – PPO | Attending: Internal Medicine | Admitting: Internal Medicine

## 2014-04-14 DIAGNOSIS — F17200 Nicotine dependence, unspecified, uncomplicated: Secondary | ICD-10-CM | POA: Diagnosis present

## 2014-04-14 DIAGNOSIS — R17 Unspecified jaundice: Secondary | ICD-10-CM

## 2014-04-14 DIAGNOSIS — I10 Essential (primary) hypertension: Secondary | ICD-10-CM

## 2014-04-14 DIAGNOSIS — I509 Heart failure, unspecified: Secondary | ICD-10-CM | POA: Diagnosis present

## 2014-04-14 DIAGNOSIS — Z860100 Personal history of colon polyps, unspecified: Secondary | ICD-10-CM

## 2014-04-14 DIAGNOSIS — R4701 Aphasia: Secondary | ICD-10-CM | POA: Diagnosis present

## 2014-04-14 DIAGNOSIS — Z8546 Personal history of malignant neoplasm of prostate: Secondary | ICD-10-CM

## 2014-04-14 DIAGNOSIS — G459 Transient cerebral ischemic attack, unspecified: Secondary | ICD-10-CM | POA: Diagnosis present

## 2014-04-14 DIAGNOSIS — F1020 Alcohol dependence, uncomplicated: Secondary | ICD-10-CM | POA: Diagnosis present

## 2014-04-14 DIAGNOSIS — F1010 Alcohol abuse, uncomplicated: Secondary | ICD-10-CM

## 2014-04-14 DIAGNOSIS — K591 Functional diarrhea: Secondary | ICD-10-CM | POA: Diagnosis present

## 2014-04-14 DIAGNOSIS — D72829 Elevated white blood cell count, unspecified: Secondary | ICD-10-CM

## 2014-04-14 DIAGNOSIS — Z6826 Body mass index (BMI) 26.0-26.9, adult: Secondary | ICD-10-CM

## 2014-04-14 DIAGNOSIS — I4891 Unspecified atrial fibrillation: Secondary | ICD-10-CM | POA: Diagnosis present

## 2014-04-14 DIAGNOSIS — E119 Type 2 diabetes mellitus without complications: Secondary | ICD-10-CM | POA: Diagnosis present

## 2014-04-14 DIAGNOSIS — N179 Acute kidney failure, unspecified: Secondary | ICD-10-CM | POA: Diagnosis present

## 2014-04-14 DIAGNOSIS — A4159 Other Gram-negative sepsis: Secondary | ICD-10-CM | POA: Diagnosis present

## 2014-04-14 DIAGNOSIS — E118 Type 2 diabetes mellitus with unspecified complications: Secondary | ICD-10-CM

## 2014-04-14 DIAGNOSIS — I729 Aneurysm of unspecified site: Secondary | ICD-10-CM

## 2014-04-14 DIAGNOSIS — R3989 Other symptoms and signs involving the genitourinary system: Secondary | ICD-10-CM

## 2014-04-14 DIAGNOSIS — N321 Vesicointestinal fistula: Secondary | ICD-10-CM

## 2014-04-14 DIAGNOSIS — N400 Enlarged prostate without lower urinary tract symptoms: Secondary | ICD-10-CM | POA: Diagnosis present

## 2014-04-14 DIAGNOSIS — B370 Candidal stomatitis: Secondary | ICD-10-CM | POA: Diagnosis present

## 2014-04-14 DIAGNOSIS — R109 Unspecified abdominal pain: Secondary | ICD-10-CM | POA: Diagnosis not present

## 2014-04-14 DIAGNOSIS — J9600 Acute respiratory failure, unspecified whether with hypoxia or hypercapnia: Secondary | ICD-10-CM | POA: Diagnosis present

## 2014-04-14 DIAGNOSIS — E876 Hypokalemia: Secondary | ICD-10-CM | POA: Diagnosis present

## 2014-04-14 DIAGNOSIS — Z886 Allergy status to analgesic agent status: Secondary | ICD-10-CM | POA: Diagnosis not present

## 2014-04-14 DIAGNOSIS — R599 Enlarged lymph nodes, unspecified: Secondary | ICD-10-CM | POA: Diagnosis present

## 2014-04-14 DIAGNOSIS — Z7902 Long term (current) use of antithrombotics/antiplatelets: Secondary | ICD-10-CM

## 2014-04-14 DIAGNOSIS — A419 Sepsis, unspecified organism: Secondary | ICD-10-CM

## 2014-04-14 DIAGNOSIS — E1159 Type 2 diabetes mellitus with other circulatory complications: Secondary | ICD-10-CM | POA: Diagnosis present

## 2014-04-14 DIAGNOSIS — E871 Hypo-osmolality and hyponatremia: Secondary | ICD-10-CM

## 2014-04-14 DIAGNOSIS — N390 Urinary tract infection, site not specified: Secondary | ICD-10-CM | POA: Diagnosis present

## 2014-04-14 DIAGNOSIS — Z9079 Acquired absence of other genital organ(s): Secondary | ICD-10-CM

## 2014-04-14 DIAGNOSIS — I5031 Acute diastolic (congestive) heart failure: Secondary | ICD-10-CM

## 2014-04-14 DIAGNOSIS — Z720 Tobacco use: Secondary | ICD-10-CM

## 2014-04-14 DIAGNOSIS — R6520 Severe sepsis without septic shock: Secondary | ICD-10-CM

## 2014-04-14 HISTORY — DX: Vesical fistula, not elsewhere classified: N32.2

## 2014-04-14 LAB — URINE MICROSCOPIC-ADD ON

## 2014-04-14 LAB — COMPREHENSIVE METABOLIC PANEL
ALT: 8 U/L (ref 0–53)
AST: 14 U/L (ref 0–37)
Albumin: 3.4 g/dL — ABNORMAL LOW (ref 3.5–5.2)
Alkaline Phosphatase: 58 U/L (ref 39–117)
Anion gap: 15 (ref 5–15)
BILIRUBIN TOTAL: 1.7 mg/dL — AB (ref 0.3–1.2)
BUN: 15 mg/dL (ref 6–23)
CHLORIDE: 88 meq/L — AB (ref 96–112)
CO2: 21 meq/L (ref 19–32)
CREATININE: 1.17 mg/dL (ref 0.50–1.35)
Calcium: 8.2 mg/dL — ABNORMAL LOW (ref 8.4–10.5)
GFR, EST AFRICAN AMERICAN: 75 mL/min — AB (ref >90)
GFR, EST NON AFRICAN AMERICAN: 65 mL/min — AB (ref >90)
GLUCOSE: 233 mg/dL — AB (ref 70–99)
Potassium: 4.3 mEq/L (ref 3.7–5.3)
Sodium: 124 mEq/L — ABNORMAL LOW (ref 137–147)
Total Protein: 6.7 g/dL (ref 6.0–8.3)

## 2014-04-14 LAB — URINALYSIS, ROUTINE W REFLEX MICROSCOPIC
Glucose, UA: 100 mg/dL — AB
Ketones, ur: NEGATIVE mg/dL
NITRITE: POSITIVE — AB
Specific Gravity, Urine: 1.019 (ref 1.005–1.030)
UROBILINOGEN UA: 1 mg/dL (ref 0.0–1.0)
pH: 5.5 (ref 5.0–8.0)

## 2014-04-14 LAB — CBC WITH DIFFERENTIAL/PLATELET
BASOS ABS: 0 10*3/uL (ref 0.0–0.1)
Basophils Relative: 0 % (ref 0–1)
Eosinophils Absolute: 0 10*3/uL (ref 0.0–0.7)
Eosinophils Relative: 0 % (ref 0–5)
HEMATOCRIT: 45.5 % (ref 39.0–52.0)
HEMOGLOBIN: 16.2 g/dL (ref 13.0–17.0)
LYMPHS ABS: 0.3 10*3/uL — AB (ref 0.7–4.0)
LYMPHS PCT: 2 % — AB (ref 12–46)
MCH: 33.8 pg (ref 26.0–34.0)
MCHC: 35.6 g/dL (ref 30.0–36.0)
MCV: 94.8 fL (ref 78.0–100.0)
MONO ABS: 0.9 10*3/uL (ref 0.1–1.0)
MONOS PCT: 5 % (ref 3–12)
NEUTROS ABS: 16.3 10*3/uL — AB (ref 1.7–7.7)
Neutrophils Relative %: 93 % — ABNORMAL HIGH (ref 43–77)
Platelets: 167 10*3/uL (ref 150–400)
RBC: 4.8 MIL/uL (ref 4.22–5.81)
RDW: 11.9 % (ref 11.5–15.5)
WBC: 17.5 10*3/uL — AB (ref 4.0–10.5)

## 2014-04-14 LAB — LACTIC ACID, PLASMA: Lactic Acid, Venous: 1.4 mmol/L (ref 0.5–2.2)

## 2014-04-14 LAB — LIPASE, BLOOD: LIPASE: 20 U/L (ref 11–59)

## 2014-04-14 MED ORDER — SODIUM CHLORIDE 0.9 % IV SOLN
INTRAVENOUS | Status: DC
  Administered 2014-04-14 – 2014-04-15 (×3): via INTRAVENOUS

## 2014-04-14 MED ORDER — HYDROMORPHONE HCL PF 1 MG/ML IJ SOLN
1.0000 mg | Freq: Once | INTRAMUSCULAR | Status: AC
  Administered 2014-04-14: 1 mg via INTRAVENOUS
  Filled 2014-04-14: qty 1

## 2014-04-14 MED ORDER — FENTANYL CITRATE 0.05 MG/ML IJ SOLN
100.0000 ug | Freq: Once | INTRAMUSCULAR | Status: AC
  Administered 2014-04-14: 100 ug via INTRAVENOUS
  Filled 2014-04-14: qty 2

## 2014-04-14 MED ORDER — FOLIC ACID 1 MG PO TABS
1.0000 mg | ORAL_TABLET | Freq: Every day | ORAL | Status: DC
  Administered 2014-04-14 – 2014-04-23 (×10): 1 mg via ORAL
  Filled 2014-04-14 (×12): qty 1

## 2014-04-14 MED ORDER — LORAZEPAM 1 MG PO TABS
1.0000 mg | ORAL_TABLET | Freq: Four times a day (QID) | ORAL | Status: DC | PRN
  Filled 2014-04-14: qty 1

## 2014-04-14 MED ORDER — CEFTRIAXONE SODIUM 2 G IJ SOLR
2.0000 g | Freq: Once | INTRAMUSCULAR | Status: AC
  Administered 2014-04-14: 2 g via INTRAVENOUS
  Filled 2014-04-14: qty 2

## 2014-04-14 MED ORDER — THIAMINE HCL 100 MG/ML IJ SOLN
100.0000 mg | Freq: Every day | INTRAMUSCULAR | Status: DC
  Administered 2014-04-14: 100 mg via INTRAVENOUS
  Filled 2014-04-14 (×6): qty 1

## 2014-04-14 MED ORDER — SODIUM CHLORIDE 0.9 % IV BOLUS (SEPSIS)
1000.0000 mL | Freq: Once | INTRAVENOUS | Status: AC
  Administered 2014-04-14: 1000 mL via INTRAVENOUS

## 2014-04-14 MED ORDER — ONDANSETRON HCL 4 MG/2ML IJ SOLN: 4.0000 mg | Freq: Three times a day (TID) | INTRAMUSCULAR | Status: DC | PRN

## 2014-04-14 MED ORDER — VITAMIN B-1 100 MG PO TABS
100.0000 mg | ORAL_TABLET | Freq: Every day | ORAL | Status: DC
  Administered 2014-04-15 – 2014-04-23 (×9): 100 mg via ORAL
  Filled 2014-04-14 (×9): qty 1

## 2014-04-14 MED ORDER — ZOLPIDEM TARTRATE 5 MG PO TABS
5.0000 mg | ORAL_TABLET | Freq: Every evening | ORAL | Status: DC | PRN
  Administered 2014-04-15 – 2014-04-22 (×7): 5 mg via ORAL
  Filled 2014-04-14 (×7): qty 1

## 2014-04-14 MED ORDER — MORPHINE SULFATE 4 MG/ML IJ SOLN
4.0000 mg | Freq: Once | INTRAMUSCULAR | Status: AC
  Administered 2014-04-14: 4 mg via INTRAVENOUS
  Filled 2014-04-14: qty 1

## 2014-04-14 MED ORDER — AMLODIPINE BESYLATE 5 MG PO TABS
5.0000 mg | ORAL_TABLET | Freq: Every day | ORAL | Status: DC
  Administered 2014-04-14 – 2014-04-18 (×5): 5 mg via ORAL
  Filled 2014-04-14 (×6): qty 1

## 2014-04-14 MED ORDER — ONDANSETRON HCL 4 MG/2ML IJ SOLN
4.0000 mg | Freq: Four times a day (QID) | INTRAMUSCULAR | Status: DC | PRN
  Administered 2014-04-14 – 2014-04-15 (×3): 4 mg via INTRAVENOUS
  Filled 2014-04-14 (×4): qty 2

## 2014-04-14 MED ORDER — DOXAZOSIN MESYLATE 4 MG PO TABS
4.0000 mg | ORAL_TABLET | Freq: Every day | ORAL | Status: DC
Start: 2014-04-14 — End: 2014-04-17
  Administered 2014-04-14 – 2014-04-16 (×3): 4 mg via ORAL
  Filled 2014-04-14 (×4): qty 1

## 2014-04-14 MED ORDER — ONDANSETRON HCL 4 MG/2ML IJ SOLN
4.0000 mg | Freq: Once | INTRAMUSCULAR | Status: AC
  Administered 2014-04-14: 4 mg via INTRAVENOUS
  Filled 2014-04-14: qty 2

## 2014-04-14 MED ORDER — ADULT MULTIVITAMIN W/MINERALS CH
1.0000 | ORAL_TABLET | Freq: Every day | ORAL | Status: DC
  Administered 2014-04-14 – 2014-04-23 (×10): 1 via ORAL
  Filled 2014-04-14 (×10): qty 1

## 2014-04-14 MED ORDER — HYDROMORPHONE HCL PF 1 MG/ML IJ SOLN: 1.0000 mg | INTRAMUSCULAR | Status: DC | PRN

## 2014-04-14 MED ORDER — HYDROMORPHONE HCL PF 2 MG/ML IJ SOLN
1.5000 mg | INTRAMUSCULAR | Status: DC | PRN
  Administered 2014-04-14 (×3): 1.5 mg via INTRAVENOUS
  Filled 2014-04-14 (×4): qty 1

## 2014-04-14 MED ORDER — ATENOLOL 100 MG PO TABS
100.0000 mg | ORAL_TABLET | Freq: Every day | ORAL | Status: DC
  Administered 2014-04-14 – 2014-04-16 (×3): 100 mg via ORAL
  Filled 2014-04-14 (×3): qty 1

## 2014-04-14 MED ORDER — DEXTROSE 5 % IV SOLN: 2.0000 g | INTRAVENOUS | Status: DC

## 2014-04-14 MED ORDER — LORAZEPAM 2 MG/ML IJ SOLN
1.0000 mg | Freq: Four times a day (QID) | INTRAMUSCULAR | Status: DC | PRN
  Administered 2014-04-14 – 2014-04-15 (×4): 1 mg via INTRAVENOUS
  Filled 2014-04-14 (×4): qty 1

## 2014-04-14 MED ORDER — IRBESARTAN 75 MG PO TABS
75.0000 mg | ORAL_TABLET | Freq: Every day | ORAL | Status: DC
  Administered 2014-04-14 – 2014-04-15 (×2): 75 mg via ORAL
  Filled 2014-04-14 (×2): qty 1

## 2014-04-14 NOTE — Progress Notes (Addendum)
ANTIBIOTIC CONSULT NOTE - INITIAL  Pharmacy Consult for Rocephin  Indication: rule out sepsis  Allergies  Allergen Reactions  . Sulfa Antibiotics Other (See Comments)    headaches   Patient Measurements: Height: 5\' 9"  (175.3 cm) Weight: 176 lb (79.833 kg) IBW/kg (Calculated) : 70.7  Vital Signs: Temp: 102 F (38.9 C) (08/26 [id number]) Temp src: Oral (08/26 0637) BP: 145/74 mmHg (08/26 0637) Pulse Rate: 77 (08/26 0637) Intake/Output from previous day:   Intake/Output from this shift:    Labs:  Recent Labs  04/14/14 0655  WBC 17.5*  HGB 16.2  PLT 167   Estimated Creatinine Clearance: 95.7 ml/min (by C-G formula based on Cr of 0.76). No results found for this basename: VANCOTROUGH, VANCOPEAK, VANCORANDOM, GENTTROUGH, GENTPEAK, GENTRANDOM, TOBRATROUGH, TOBRAPEAK, TOBRARND, AMIKACINPEAK, AMIKACINTROU, AMIKACIN,  in the last 72 hours   Microbiology: No results found for this or any previous visit (from the past 720 hour(s)).  Medical History: Past Medical History  Diagnosis Date  . Hypertension   . Cancer     Prostate surgery  . Headache(784.0)   . Pneumonia   . Diabetes mellitus   . 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   Marland Kitchen PNA (pneumonia) 06/26/2011  . Compression fracture of thoracic vertebra 06/26/2011  . Fistula, bladder    Medications:  Scheduled:   Anti-infectives   Start     Dose/Rate Route Frequency Ordered Stop   04/14/14 0730  cefTRIAXone (ROCEPHIN) 2 g in dextrose 5 % 50 mL IVPB     2 g 100 mL/hr over 30 Minutes Intravenous  Once 04/14/14 [id number]       Assessment: 82 yoM with colovesical fistula, planned for colonoscopy/surgery in October. Presented to ED with c/o L flank pain, nausea/vomiting, fever/chills. Urine smelled of stool. Rocephin 2gm x1 ordered in ED, then Pharmacy to dose.  Rocephin needs  no monitoring, or dose adjustment for renal function  Blood and urine cultures obtained prior to antibiotics  Leukocytosis (17.5K), Lactic acid wnl  Goal of Therapy:  Antibiotic appropriate for cultures, clinical course  Plan:   Continue Rocephin 2gm q24  Follow cultures, finalize abx regimen  Minda Ditto PharmD Pager [phone] 04/14/2014, 7:54 AM

## 2014-04-14 NOTE — ED Provider Notes (Signed)
Medical screening examination/treatment/procedure(s) were conducted as a shared visit with non-physician practitioner(s) and myself.  I personally evaluated the patient during the encounter.   EKG Interpretation None     Patient here with fever and malaise. 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 Likely with pyelonephritis and given antibiotics and will be admitted to the medicine service  Leota Jacobsen, MD 04/14/14 [phone]

## 2014-04-14 NOTE — H&P (Signed)
History and Physical    Douglas Ballard [id number] DOB: [birth year] DOA: 04/14/2014  Referring physician: Dr. Zenia Ballard PCP: Douglas Shire, MD  Specialists: none  Chief Complaint: left flank pain  HPI: Douglas Ballard is a 63 y.o. male has a past medical history significant for HTN, prostate cancer s/p prostatectomy by Urology (Dr. Risa Ballard), newly diagnosed colo vesicular fistula followed by Dr. Johney Ballard and with planned surgical intervention soon, presents to the ED with new onset left flank pain that started this morning ~4 am. Patient reports fevers and severe pain. He also endorses significant nausea and poor po intake since this morning. Denies chest pain or breathing difficulties. In the ED patient found to have sepsis due to UTI and TRH asked for admission. He has a history of 2 UTIs in the past few months but no pyelonephritis.   Patient continues to smoke and endorses "almost" daily ETOH.  Review of Systems: as per HPI otherwise negative  Past Medical History  Diagnosis Date  . Hypertension   . Cancer     Prostate surgery  . Headache(784.0)   . Pneumonia   . Diabetes mellitus   . 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   Marland Kitchen PNA (pneumonia) 06/26/2011  . Compression fracture of thoracic vertebra 06/26/2011  . Fistula, bladder    Past Surgical History  Procedure Laterality Date  . Robot assisted laparoscopic radical prostatectomy  01/31/2011    Robotic-assisted laparoscopic radical retropubic  . Lipoma excision  2012    Dr Douglas Ballard   Social History:  reports that he has been smoking Cigarettes.  He has a 90 pack-year smoking history. He has never used smokeless tobacco. He reports that he drinks about 3.5 - 5 ounces of alcohol per week. He reports that he does not use illicit drugs.  Allergies  Allergen Reactions  .  Sulfa Antibiotics Other (See Comments)    headaches   History reviewed. No pertinent family history.  Prior to Admission medications   Medication Sig Start Date End Date Taking? Authorizing Provider  amLODipine (NORVASC) 5 MG tablet Take 5 mg by mouth daily.   Yes Historical Provider, MD  atenolol (TENORMIN) 100 MG tablet Take 100 mg by mouth daily.  01/17/14  Yes Historical Provider, MD  doxazosin (CARDURA) 4 MG tablet Take 4 mg by mouth daily.  01/25/14  Yes Historical Provider, MD  furosemide (LASIX) 20 MG tablet Take 20 mg by mouth daily.  02/27/14  Yes Historical Provider, MD  Multiple Vitamins-Minerals (MULTIVITAMINS THER. W/MINERALS) TABS Take 1 tablet by mouth daily.     Yes Historical Provider, MD  zolpidem (AMBIEN) 5 MG tablet Take 5 mg by mouth at bedtime as needed for sleep.  12/31/13  Yes Historical Provider, MD  olmesartan (BENICAR) 40 MG tablet Take 1 tablet (40 mg total) by mouth daily. 07/02/11 07/01/12  Douglas Dumas, MD   Physical Exam: Filed Vitals:   04/14/14 [id number] 04/14/14 0753 04/14/14 0942 04/14/14 1018  BP: 145/74 169/78 139/68 194/86  Pulse: 77 79 83 96  Temp: 102 F (38.9 C)   98.1 F (36.7 C)  TempSrc: Oral   Oral  Resp: 19  18 18   Height: 5\' 9"  (1.753 m)   5\' 9"  (1.753 m)  Weight: 79.833 kg (176 lb)   80.2 kg (176 lb 12.9 oz)  SpO2: 96% 100% 93%  96%    General:  In mild distress due to flank pain   Eyes: no scleral icterus  ENT: moist oropharynx  Neck: supple, no JVD  Cardiovascular: regular rate without MRG; 2+ peripheral pulses  Respiratory: CTA biL, good air movement without wheezing, rhonchi or crackled  Abdomen: soft, non tender to palpation, positive bowel sounds, no guarding, no rebound  Skin: no rashes  Musculoskeletal: no peripheral edema; CVA tenderness  Neurologic: non focal   Labs on Admission:  Basic Metabolic Panel:  Recent Labs Lab 04/14/14 0655  NA 124*  K 4.3  CL 88*  CO2 21  GLUCOSE 233*  BUN 15  CREATININE 1.17    CALCIUM 8.2*   Liver Function Tests:  Recent Labs Lab 04/14/14 0655  AST 14  ALT 8  ALKPHOS 58  BILITOT 1.7*  PROT 6.7  ALBUMIN 3.4*    Recent Labs Lab 04/14/14 0655  LIPASE 20   CBC:  Recent Labs Lab 04/14/14 0655  WBC 17.5*  NEUTROABS 16.3*  HGB 16.2  HCT 45.5  MCV 94.8  PLT 167   Radiological Exams on Admission: US Renal  04/14/2014   CLINICAL DATA:  63 year old male with left costovertebral angle pain and tenderness. Initial encounter. Urinary tract infection.  EXAM: RENAL/URINARY TRACT ULTRASOUND COMPLETE  COMPARISON:  [facility] CT Abdomen and Pelvis 02/24/2014.  FINDINGS: Right Kidney:  Length: 13.6 cm. Small area of upper pole scarring demonstrated on image 3 today, series 2, image 26 of the comparison. Otherwise echogenicity within normal limits. No mass or hydronephrosis visualized.  Left Kidney:  Length: 12.5 cm. Echogenicity within normal limits. No mass or hydronephrosis visualized.  Bladder:  Decompressed, not visible.  IMPRESSION: Negative sonographic appearance of the kidneys. Decompressed bladder.   Electronically Signed   By: Douglas Ballard M.D.   On: 04/14/2014 10:11   Dg Chest Port 1 View  04/14/2014   CLINICAL DATA:  Fever, sepsis  EXAM: PORTABLE CHEST - 1 VIEW  COMPARISON:  06/28/2011  FINDINGS: Cardiomegaly with pulmonary vascular congestion and possible mild interstitial edema. Mild bilateral lower lobe opacities, likely atelectasis. No focal consolidation. No pleural effusion or pneumothorax.  IMPRESSION: Screening loop pulmonary vascular congestion and possible mild interstitial edema.  Mild patchy bilateral lower lobe opacities, likely atelectasis.   Electronically Signed   By: Douglas Ballard M.D.   On: 04/14/2014 08:34    EKG: Independently reviewed.  Assessment/Plan Active Problems:   HTN (hypertension)   Personal history of prostate cancer s/p prostatectomy 2012   Hyponatremia   Alcohol abuse   Tobacco abuse    Colovesical fistula   Sepsis   Urinary tract infection   Elevated bilirubin   Sepsis due to UTI - meets criteria with fever, leukocytosis and a source.  - start Ceftriaxone, no prior history of MDRs - blood and urine cultures ordered  HTN - hypertensive to systolic [id number] on arrival to floor, pain contributing - pain control, restart home medications  Tobacco abuse - cessation provided  ETOH use - he is somewhat vague how much he drinks, but "almost daily". Place on CIWA.  Elevated bili - ? Related to ETOH, AST/ALT normal. Had a CT abdomen pelvis done for his fistula (wife has the report in the ED) mentioning fatty liver.  Colovesical fistula - per surgery this needs repair once a colonoscopy is done to assess for masses/cancer. He is scheduled to see [facility] as an outpatient.   Hyponatremia - chronic, his Na in the low 120s  since 2012, ?ETOH related  Diet: liquid Fluids: NS DVT Propylaxis: SCD  Code Status: Full  Family Communication: d/w wife  Disposition Plan: inpatient  Time spent: 27  This note has been created with Surveyor, quantity. Any transcriptional errors are unintentional.   Areyana Leoni M. Cruzita Lederer, MD [facility] Pager [phone]  If 7PM-7AM, please contact night-coverage [email or web address] Password [facility] 04/14/2014, 10:41 AM

## 2014-04-14 NOTE — ED Notes (Signed)
Pt states he has a dx of a fistula between his colon and bladder. Pt is working with Alwyn Pea to arrange for a surgical repair sometime in October. The pt is febrile and having severe left sided flank pain.

## 2014-04-14 NOTE — ED Notes (Signed)
Report given to Esther, RN.

## 2014-04-14 NOTE — Progress Notes (Signed)
UR completed 

## 2014-04-14 NOTE — ED Provider Notes (Signed)
CSN: [id number]     Arrival date & time 04/14/14  [id number] History   First MD Initiated Contact with Patient 04/14/14 [phone]     Chief Complaint  Patient presents with  . Fever  . Flank Pain     HPI Douglas Ballard is a 63 y.o. male with PMH HTN, prostate cancer, prostatectomy, DM, vesicocolic fistula complaining of left flank pain that woke up him up at 4am this morning with nausea, two episodes of watery emesis. Nonbloody nonbilious. No coffee grounds. Patient is febrile at 102F and reports chills. Pt is not tachycardic but takes atenolol. Patient reports cloudy urine with a foul odor that smelled of stool. No hematuria, reports dysuria, frequency and normal amount of urine. Patient seen by Alwyn Pea and dx with a fistula of colon and bladder. Patient also complains of LLQ pain that is sharp without radiation. Patient had last BM this morning and it was normal.  No hematochezia or melena. No nightsweats or weight loss, no CP, SOB, cough, recent illness, HA, weakness.    Past Medical History  Diagnosis Date  . Hypertension   . Cancer     Prostate surgery  . Headache(784.0)   . Pneumonia   . Diabetes mellitus   . 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   Marland Kitchen PNA (pneumonia) 06/26/2011  . Compression fracture of thoracic vertebra 06/26/2011  . Fistula, bladder    Past Surgical History  Procedure Laterality Date  . Robot assisted laparoscopic radical prostatectomy  01/31/2011    Robotic-assisted laparoscopic radical retropubic  . Lipoma excision  2012    Dr Nonah Mattes   History reviewed. No pertinent family history. History  Substance Use Topics  . Smoking status: Current Every Day Smoker -- 2.00 packs/day for 45 years    Types: Cigarettes  . Smokeless tobacco: Never Used  . Alcohol Use: 3.5 - 5.0 oz/week    7-10 drink(s) per week     Comment: vodka daily    Review of Systems  Constitutional: Positive for fever and chills.  HENT: Negative for congestion and rhinorrhea.   Eyes: Negative for visual disturbance.  Respiratory: Negative for cough and shortness of breath.   Cardiovascular: Negative for chest pain and palpitations.  Gastrointestinal: Positive for nausea, vomiting and abdominal pain. Negative for diarrhea, blood in stool and anal bleeding.  Genitourinary: Positive for frequency and flank pain. Negative for dysuria and hematuria.  Musculoskeletal: Negative for back pain and gait problem.  Skin: Negative for rash.  Neurological: Negative for weakness and headaches.      Allergies  Sulfa antibiotics  Home Medications   Prior to Admission medications   Medication Sig Start Date End Date Taking? Authorizing Provider  amLODipine (NORVASC) 5 MG tablet Take 5 mg by mouth daily.   Yes Historical Provider, MD  atenolol (TENORMIN) 100 MG tablet Take 100 mg by mouth daily.  01/17/14  Yes Historical Provider, MD  doxazosin (CARDURA) 4 MG tablet Take 4 mg by mouth daily.  01/25/14  Yes Historical Provider, MD  furosemide (LASIX) 20 MG tablet Take 20 mg by mouth daily.  02/27/14  Yes Historical Provider, MD  Multiple Vitamins-Minerals (MULTIVITAMINS THER. W/MINERALS) TABS Take 1 tablet by mouth daily.     Yes Historical Provider, MD  zolpidem (AMBIEN) 5 MG tablet Take 5 mg by mouth at bedtime as needed  for sleep.  12/31/13  Yes Historical Provider, MD  olmesartan (BENICAR) 40 MG tablet Take 1 tablet (40 mg total) by mouth daily. 07/02/11 07/01/12  Reyne Dumas, MD   BP 133/70  Pulse 72  Temp(Src) 98.1 F (36.7 C) (Oral)  Resp 16  Ht 5\' 9"  (1.753 m)  Wt 176 lb 12.9 oz (80.2 kg)  BMI 26.10 kg/m2  SpO2 93% Physical Exam  Nursing note and vitals reviewed. Constitutional: He appears well-developed and well-nourished. No distress.  HENT:  Head: Normocephalic and atraumatic.  Eyes: Conjunctivae and EOM are normal.  Right eye exhibits no discharge. Left eye exhibits no discharge. No scleral icterus.  Cardiovascular: Normal rate, regular rhythm and normal heart sounds.   Pulmonary/Chest: Effort normal and breath sounds normal. No respiratory distress. He has no wheezes.  Abdominal: Soft. Bowel sounds are normal.  Diffusely tender, especially LLQ with voluntary guarding no rebound. CVA tenderness to left flank.  Musculoskeletal: Normal range of motion. He exhibits no tenderness.  Neurological: He is alert. He exhibits normal muscle tone. Coordination normal.  Skin: Skin is warm and dry. He is not diaphoretic.  Psychiatric: He has a normal mood and affect. His behavior is normal.    ED Course  Procedures (including critical care time) Labs Review Labs Reviewed  CBC WITH DIFFERENTIAL - Abnormal; Notable for the following:    WBC 17.5 (*)    Neutrophils Relative % 93 (*)    Neutro Abs 16.3 (*)    Lymphocytes Relative 2 (*)    Lymphs Abs 0.3 (*)    All other components within normal limits  COMPREHENSIVE METABOLIC PANEL - Abnormal; Notable for the following:    Sodium 124 (*)    Chloride 88 (*)    Glucose, Bld 233 (*)    Calcium 8.2 (*)    Albumin 3.4 (*)    Total Bilirubin 1.7 (*)    GFR calc non Af Amer 65 (*)    GFR calc Af Amer 75 (*)    All other components within normal limits  URINALYSIS, ROUTINE W REFLEX MICROSCOPIC - Abnormal; Notable for the following:    Color, Urine ORANGE (*)    APPearance CLOUDY (*)    Glucose, UA 100 (*)    Hgb urine dipstick SMALL (*)    Bilirubin Urine SMALL (*)    Protein, ur >300 (*)    Nitrite POSITIVE (*)    Leukocytes, UA MODERATE (*)    All other components within normal limits  URINE MICROSCOPIC-ADD ON - Abnormal; Notable for the following:    Bacteria, UA MANY (*)    All other components within normal limits  CULTURE, BLOOD (ROUTINE X 2)  CULTURE, BLOOD (ROUTINE X 2)  URINE CULTURE  LIPASE, BLOOD  LACTIC ACID, PLASMA  CBC  COMPREHENSIVE  METABOLIC PANEL    Imaging Review US Renal  04/14/2014   CLINICAL DATA:  63 year old male with left costovertebral angle pain and tenderness. Initial encounter. Urinary tract infection.  EXAM: RENAL/URINARY TRACT ULTRASOUND COMPLETE  COMPARISON:  [facility] CT Abdomen and Pelvis 02/24/2014.  FINDINGS: Right Kidney:  Length: 13.6 cm. Small area of upper pole scarring demonstrated on image 3 today, series 2, image 26 of the comparison. Otherwise echogenicity within normal limits. No mass or hydronephrosis visualized.  Left Kidney:  Length: 12.5 cm. Echogenicity within normal limits. No mass or hydronephrosis visualized.  Bladder:  Decompressed, not visible.  IMPRESSION: Negative sonographic appearance of the kidneys. Decompressed bladder.   Electronically Signed  By: Lars Pinks M.D.   On: 04/14/2014 10:11   Dg Chest Port 1 View  04/14/2014   CLINICAL DATA:  Fever, sepsis  EXAM: PORTABLE CHEST - 1 VIEW  COMPARISON:  06/28/2011  FINDINGS: Cardiomegaly with pulmonary vascular congestion and possible mild interstitial edema. Mild bilateral lower lobe opacities, likely atelectasis. No focal consolidation. No pleural effusion or pneumothorax.  IMPRESSION: Screening loop pulmonary vascular congestion and possible mild interstitial edema.  Mild patchy bilateral lower lobe opacities, likely atelectasis.   Electronically Signed   By: Julian Hy M.D.   On: 04/14/2014 08:34     EKG Interpretation None     Meds given in ED:  Medications  cefTRIAXone (ROCEPHIN) 2 g in dextrose 5 % 50 mL IVPB (2 g Intravenous Not Given 04/14/14 0929)  ondansetron (ZOFRAN) injection 4 mg (4 mg Intravenous Given 04/14/14 0938)  HYDROmorphone (DILAUDID) injection 1.5 mg (1.5 mg Intravenous Given 04/14/14 1714)  0.9 %  sodium chloride infusion ( Intravenous New Bag/Given 04/14/14 1105)  amLODipine (NORVASC) tablet 5 mg (5 mg Oral Given 04/14/14 1100)  atenolol (TENORMIN) tablet 100 mg (100 mg Oral Given 04/14/14  1100)  doxazosin (CARDURA) tablet 4 mg (4 mg Oral Given 04/14/14 1100)  zolpidem (AMBIEN) tablet 5 mg (not administered)  irbesartan (AVAPRO) tablet 75 mg (75 mg Oral Given 04/14/14 1256)  LORazepam (ATIVAN) tablet 1 mg ( Oral See Alternative 04/14/14 1157)    Or  LORazepam (ATIVAN) injection 1 mg (1 mg Intravenous Given 04/14/14 1157)  thiamine (VITAMIN B-1) tablet 100 mg ( Oral See Alternative 04/14/14 1256)    Or  thiamine (B-1) injection 100 mg (100 mg Intravenous Given [id number])  folic acid (FOLVITE) tablet 1 mg (1 mg Oral Given 04/14/14 1605)  multivitamin with minerals tablet 1 tablet (1 tablet Oral Given 04/14/14 1256)  morphine 4 MG/ML injection 4 mg (4 mg Intravenous Given 04/14/14 0718)  ondansetron (ZOFRAN) injection 4 mg (4 mg Intravenous Given 04/14/14 0718)  cefTRIAXone (ROCEPHIN) 2 g in dextrose 5 % 50 mL IVPB (0 g Intravenous Stopped 04/14/14 0900)  sodium chloride 0.9 % bolus 1,000 mL (0 mLs Intravenous Stopped 04/14/14 0901)  sodium chloride 0.9 % bolus 1,000 mL (0 mLs Intravenous Stopped 04/14/14 0901)  fentaNYL (SUBLIMAZE) injection 100 mcg (100 mcg Intravenous Given 04/14/14 0756)  HYDROmorphone (DILAUDID) injection 1 mg (1 mg Intravenous Given 04/14/14 0904)  HYDROmorphone (DILAUDID) injection 1 mg (1 mg Intravenous Given 04/14/14 [id number])    Current Discharge Medication List        MDM   Final diagnoses:  Sepsis, due to unspecified organism  Vesicocolic fistula  Hyponatremia  Hypokalemia   Jaymz Maddaloni is a 63 y.o. male with PMH of vesicocolic fistula, prostate ca and prostatectomy, DM, HTN complaining of severe left flank pain, fevers, chills, LLQ abdominal pain. Patient reports dysuria, frequency and foul urine smelling of feces. Patient is febrile to 102 with pulse of 77 but on atenolol. WBC 17.5 with left shift. Patient meets SIRS criteria and has vesicocolic fistula, source of infection. Patient is septic.Other vitals stable. No AMS.  Lactic acid 1.4. IV fluids,  abx, morphine, and zofran administered. Blood and urine cultures obtained.   8:00 Patient complaining of the same pain. Fentanyl 170mcg ordered. 9:00 Repeat abdominal exam with LLQ TN that is improving. No peritoneal signs. Still significant L flank pain. Dilaudid 1mg  ordered.  Patient discussed with the hospitalist service who agree to admit.  Discussed return precautions with patient. Discussed all results and  patient verbalizes understanding and agrees with plan.  This is a shared patient. This patient was discussed with the physician, Dr. Lacretia Leigh who saw and evaluated the patient.     Pura Spice, PA-C 04/14/14 2015

## 2014-04-15 DIAGNOSIS — E871 Hypo-osmolality and hyponatremia: Secondary | ICD-10-CM

## 2014-04-15 DIAGNOSIS — D72829 Elevated white blood cell count, unspecified: Secondary | ICD-10-CM

## 2014-04-15 DIAGNOSIS — F1010 Alcohol abuse, uncomplicated: Secondary | ICD-10-CM

## 2014-04-15 DIAGNOSIS — N179 Acute kidney failure, unspecified: Secondary | ICD-10-CM

## 2014-04-15 DIAGNOSIS — N390 Urinary tract infection, site not specified: Secondary | ICD-10-CM

## 2014-04-15 DIAGNOSIS — F17200 Nicotine dependence, unspecified, uncomplicated: Secondary | ICD-10-CM

## 2014-04-15 LAB — CBC
HCT: 41.1 % (ref 39.0–52.0)
HEMOGLOBIN: 14.6 g/dL (ref 13.0–17.0)
MCH: 35 pg — ABNORMAL HIGH (ref 26.0–34.0)
MCHC: 35.5 g/dL (ref 30.0–36.0)
MCV: 98.6 fL (ref 78.0–100.0)
Platelets: 138 10*3/uL — ABNORMAL LOW (ref 150–400)
RBC: 4.17 MIL/uL — ABNORMAL LOW (ref 4.22–5.81)
RDW: 12.4 % (ref 11.5–15.5)
WBC: 22 10*3/uL — AB (ref 4.0–10.5)

## 2014-04-15 LAB — COMPREHENSIVE METABOLIC PANEL
ALK PHOS: 45 U/L (ref 39–117)
ALT: 6 U/L (ref 0–53)
AST: 11 U/L (ref 0–37)
Albumin: 2.7 g/dL — ABNORMAL LOW (ref 3.5–5.2)
Anion gap: 15 (ref 5–15)
BUN: 24 mg/dL — ABNORMAL HIGH (ref 6–23)
CO2: 19 mEq/L (ref 19–32)
Calcium: 7.4 mg/dL — ABNORMAL LOW (ref 8.4–10.5)
Chloride: 92 mEq/L — ABNORMAL LOW (ref 96–112)
Creatinine, Ser: 1.95 mg/dL — ABNORMAL HIGH (ref 0.50–1.35)
GFR, EST AFRICAN AMERICAN: 41 mL/min — AB (ref >90)
GFR, EST NON AFRICAN AMERICAN: 35 mL/min — AB (ref >90)
GLUCOSE: 173 mg/dL — AB (ref 70–99)
POTASSIUM: 5.1 meq/L (ref 3.7–5.3)
Sodium: 126 mEq/L — ABNORMAL LOW (ref 137–147)
Total Bilirubin: 1.4 mg/dL — ABNORMAL HIGH (ref 0.3–1.2)
Total Protein: 6.1 g/dL (ref 6.0–8.3)

## 2014-04-15 MED ORDER — NICOTINE 14 MG/24HR TD PT24
14.0000 mg | MEDICATED_PATCH | Freq: Every day | TRANSDERMAL | Status: DC
  Administered 2014-04-15 – 2014-04-23 (×9): 14 mg via TRANSDERMAL
  Filled 2014-04-15 (×9): qty 1

## 2014-04-15 MED ORDER — LORAZEPAM 2 MG/ML IJ SOLN: 0.0000 mg | Freq: Two times a day (BID) | INTRAMUSCULAR | Status: AC

## 2014-04-15 MED ORDER — LORAZEPAM 1 MG PO TABS
1.0000 mg | ORAL_TABLET | Freq: Four times a day (QID) | ORAL | Status: AC | PRN
  Filled 2014-04-15: qty 1

## 2014-04-15 MED ORDER — LORAZEPAM 2 MG/ML IJ SOLN
0.0000 mg | Freq: Four times a day (QID) | INTRAMUSCULAR | Status: AC
  Administered 2014-04-15: 2 mg via INTRAVENOUS
  Administered 2014-04-15: 1 mg via INTRAVENOUS
  Filled 2014-04-15 (×2): qty 1

## 2014-04-15 MED ORDER — IPRATROPIUM-ALBUTEROL 0.5-2.5 (3) MG/3ML IN SOLN
3.0000 mL | RESPIRATORY_TRACT | Status: DC | PRN
  Administered 2014-04-16 (×2): 3 mL via RESPIRATORY_TRACT
  Filled 2014-04-15 (×2): qty 3

## 2014-04-15 MED ORDER — PIPERACILLIN-TAZOBACTAM 3.375 G IVPB
3.3750 g | Freq: Three times a day (TID) | INTRAVENOUS | Status: DC
  Administered 2014-04-15 – 2014-04-16 (×5): 3.375 g via INTRAVENOUS
  Filled 2014-04-15 (×6): qty 50

## 2014-04-15 MED ORDER — LORAZEPAM 2 MG/ML IJ SOLN
1.0000 mg | Freq: Four times a day (QID) | INTRAMUSCULAR | Status: AC | PRN
  Administered 2014-04-15 – 2014-04-16 (×2): 1 mg via INTRAVENOUS
  Filled 2014-04-15 (×3): qty 1

## 2014-04-15 MED ORDER — ISOSORB DINITRATE-HYDRALAZINE 20-37.5 MG PO TABS
1.0000 | ORAL_TABLET | Freq: Two times a day (BID) | ORAL | Status: DC
  Administered 2014-04-15 – 2014-04-23 (×17): 1 via ORAL
  Filled 2014-04-15 (×18): qty 1

## 2014-04-15 NOTE — Progress Notes (Signed)
ANTIBIOTIC CONSULT NOTE - INITIAL  Pharmacy Consult for Zosyn Indication: BC GNRs  Allergies  Allergen Reactions  . Sulfa Antibiotics Other (See Comments)    headaches    Patient Measurements: Height: 5\' 9"  (175.3 cm) Weight: 176 lb 12.9 oz (80.2 kg) IBW/kg (Calculated) : 70.7   Vital Signs: Temp: 99 F (37.2 C) (08/26 2354) Temp src: Oral (08/26 2354) BP: 130/76 mmHg (08/26 2354) Pulse Rate: 74 (08/26 2354) Intake/Output from previous day: 08/26 0701 - 08/27 0700 In: 160 [P.O.:120; I.V.:40] Out: 3 [Urine:3] Intake/Output from this shift:    Labs:  Recent Labs  04/14/14 0655  WBC 17.5*  HGB 16.2  PLT 167  CREATININE 1.17   Estimated Creatinine Clearance: 65.5 ml/min (by C-G formula based on Cr of 1.17). No results found for this basename: VANCOTROUGH, Corlis Leak, VANCORANDOM, [place], GENTPEAK, GENTRANDOM, [place], TOBRAPEAK, TOBRARND, AMIKACINPEAK, AMIKACINTROU, AMIKACIN,  in the last 72 hours   Microbiology: Recent Results (from the past 720 hour(s))  CULTURE, BLOOD (ROUTINE X 2)     Status: None   Collection Time    04/14/14  7:36 AM      Result Value Ref Range Status   Specimen Description BLOOD LEFT ANTECUBITAL   Final   Special Requests BOTTLES DRAWN AEROBIC AND ANAEROBIC 5ML   Final   Culture  Setup Time     Final   Value: 04/14/2014 11:12     Performed at [facility]   Culture     Final   Value: GRAM NEGATIVE RODS     Note: Gram Stain Report Called to,Read Back By and Verified With: [name] ON 04/14/2014 AT 11:23P BY Dennard Nip     Performed at [facility]   Report Status PENDING   Incomplete    Medical History: Past Medical History  Diagnosis Date  . Hypertension   . Cancer     Prostate surgery  . Headache(784.0)   . Pneumonia   . Diabetes mellitus   . 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 Alcohol withdrawal seizure 06/28/2011    Alcohol withdraw seizure prior to admission is suspected from history given by family &  Post ictal appearance in the ED.   Marland Kitchen PNA (pneumonia) 06/26/2011  . Compression fracture of thoracic vertebra 06/26/2011  . Fistula, bladder     Medications:  Scheduled:  . amLODipine  5 mg Oral Daily  . atenolol  100 mg Oral Daily  . doxazosin  4 mg Oral Daily  . folic acid  1 mg Oral Daily  . irbesartan  75 mg Oral Daily  . multivitamin with minerals  1 tablet Oral Daily  . piperacillin-tazobactam (ZOSYN)  IV  3.375 g Intravenous Q8H  . thiamine  100 mg Oral Daily   Or  . thiamine  100 mg Intravenous Daily   Infusions:  . sodium chloride 100 mL/hr at 04/14/14 1105   Assessment: 58 yoM admitted 8/26 r/o sepsis, colevesical fistula now with BC, GNRs.  NP changing Rocephin to Zosyn per Rx.   Goal of Therapy:  Treat infection  Plan:   Zosyn 3.375 Gm IV q8h EI infusion  F/U SCr/cultures  Lawana Pai R 04/15/2014,12:18 AM

## 2014-04-15 NOTE — Care Management Note (Signed)
    Page 1 of 1   04/15/2014     1:45:09 PM CARE MANAGEMENT NOTE 04/15/2014  Patient:  Douglas Ballard, Douglas Ballard   Account Number:  [id number]  Date Initiated:  04/15/2014  Documentation initiated by:  Dessa Phi  Subjective/Objective Assessment:   63 Y/O M ADMITTED W/SEPSIS.[id number] ABUSE,PROSTATE CA.     Action/Plan:   FROM HOME.   Anticipated DC Date:  04/19/2014   Anticipated DC Plan:  [place]  CM consult      Choice offered to / List presented to:             Status of service:  In process, will continue to follow Medicare Important Message given?   (If response is "NO", the following Medicare IM given date fields will be blank) Date Medicare IM given:   Medicare IM given by:   Date Additional Medicare IM given:   Additional Medicare IM given by:    Discharge Disposition:    Per UR Regulation:  Reviewed for med. necessity/level of care/duration of stay  If discussed at [place] of Stay Meetings, dates discussed:    Comments:  04/15/14 Maximum Reiland RN,BSN NCM [id number] IVF,IV ABX,CIWA.D/C PLAN HOME.

## 2014-04-15 NOTE — Progress Notes (Signed)
Report called to Manuela Schwartz RN on 4w, pt transferred to room 1440, spouse notified of new location.

## 2014-04-15 NOTE — Progress Notes (Signed)
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 and UTI -follow sensitivity and final speciation -continue zosyn  4-hyponatremia: secondary to alcohol abuse and dehydration -continue IVF's and follow electrolytes  5-HTN: will continue norvasc and bidil now; hold avapro due to ARF  6-alcohol abuse with delirium/withdrawal: continue CIWA, thiamine and folic acid -cessation counseling provided  7-tobacco abuse: will start nicotine patch -cessation counseling provided  8-leukocytosis: due to #1 -continue antibiotics -follow WBC's trend  Code Status: Full Family Communication: wife at bedside  Disposition Plan: to be determine   Consultants:  None   Procedures:  See below for x-ray reports   Antibiotics:  zosyn 8/26  HPI/Subjective: Slightly confused and restless; no CP, no fever, no nausea or vomiting. Requesting nicotine patch  Objective: Filed Vitals:   04/15/14 0923  BP: 142/73  Pulse: 78  Temp: 98.8 F (37.1 C)  Resp: 22    Intake/Output Summary (Last 24 hours) at 04/15/14 1248 Last data filed at 04/15/14 1200  Gross per 24 hour  Intake    640 ml  Output      3 ml  Net    637 ml   Filed Weights   04/14/14 0637 04/14/14 1018  Weight: 79.833 kg (176 lb) 80.2 kg (176 lb 12.9 oz)    Exam:   General:  Slight confusion this morning and agitation; no fever. Denies nausea, vomiting, chest pain  Cardiovascular: S1 and S2, no rubs or gallops  Respiratory: no wheezing, good air movement  Abdomen: no distension, positive  BS, no guarding  Musculoskeletal: no edema, cyanosis or clubbing  Data Reviewed: Basic Metabolic Panel:  Recent Labs Lab 04/14/14 0655 04/15/14 0526  NA 124* 126*  K 4.3 5.1  CL 88* 92*  CO2 21 19  GLUCOSE 233* 173*  BUN 15 24*  CREATININE 1.17 1.95*  CALCIUM 8.2* 7.4*   Liver Function Tests:  Recent Labs Lab 04/14/14 0655 04/15/14 0526  AST 14 11  ALT 8 6  ALKPHOS 58 45  BILITOT 1.7* 1.4*  PROT 6.7 6.1  ALBUMIN 3.4* 2.7*    Recent Labs Lab 04/14/14 0655  LIPASE 20   CBC:  Recent Labs Lab 04/14/14 0655 04/15/14 0526  WBC 17.5* 22.0*  NEUTROABS 16.3*  --   HGB 16.2 14.6  HCT 45.5 41.1  MCV 94.8 98.6  PLT 167 138*     Recent Results (from the past 240 hour(s))  CULTURE, BLOOD (ROUTINE X 2)     Status: None   Collection Time    04/14/14  6:59 AM      Result Value Ref Range Status   Specimen Description BLOOD RIGHT FOREARM   Final   Special Requests BOTTLES DRAWN AEROBIC AND ANAEROBIC 5ML   Final   Culture  Setup Time     Final   Value: 04/14/2014 11:11     Performed at [facility]   Culture     Final   Value:        BLOOD CULTURE RECEIVED NO GROWTH TO DATE CULTURE WILL BE HELD FOR 5 DAYS BEFORE ISSUING A FINAL NEGATIVE REPORT     Performed at [facility]   Report Status PENDING   Incomplete  CULTURE, BLOOD (ROUTINE X 2)     Status: None   Collection Time    04/14/14  7:36 AM      Result Value Ref Range Status   Specimen Description BLOOD LEFT ANTECUBITAL   Final   Special Requests BOTTLES DRAWN AEROBIC AND ANAEROBIC 5ML   Final   Culture  Setup Time     Final   Value: 04/14/2014 11:12     Performed at [facility]   Culture     Final   Value: GRAM NEGATIVE RODS     Note: Gram Stain Report Called to,Read Back By and Verified With: [name] ON 04/14/2014 AT 11:23P BY [name]     Performed at [facility]   Report Status PENDING   Incomplete     Studies: US Renal  04/14/2014   CLINICAL DATA:   63 year old male with left costovertebral angle pain and tenderness. Initial encounter. Urinary tract infection.  EXAM: RENAL/URINARY TRACT ULTRASOUND COMPLETE  COMPARISON:  [facility] CT Abdomen and Pelvis 02/24/2014.  FINDINGS: Right Kidney:  Length: 13.6 cm. Small area of upper pole scarring demonstrated on image 3 today, series 2, image 26 of the comparison. Otherwise echogenicity within normal limits. No mass or hydronephrosis visualized.  Left Kidney:  Length: 12.5 cm. Echogenicity within normal limits. No mass or hydronephrosis visualized.  Bladder:  Decompressed, not visible.  IMPRESSION: Negative sonographic appearance of the kidneys. Decompressed bladder.   Electronically Signed   By: Lars Pinks M.D.   On: 04/14/2014 10:11   Dg Chest Port 1 View  04/14/2014   CLINICAL DATA:  Fever, sepsis  EXAM: PORTABLE CHEST - 1 VIEW  COMPARISON:  06/28/2011  FINDINGS: Cardiomegaly with pulmonary vascular congestion and possible mild interstitial edema. Mild bilateral lower lobe opacities, likely atelectasis. No focal consolidation. No pleural effusion or pneumothorax.  IMPRESSION: Screening loop pulmonary vascular congestion and possible mild interstitial edema.  Mild patchy bilateral lower lobe opacities, likely atelectasis.   Electronically Signed   By: Julian Hy M.D.   On: 04/14/2014 08:34    Scheduled Meds: . amLODipine  5 mg Oral Daily  . atenolol  100 mg Oral Daily  . doxazosin  4 mg Oral Daily  . folic acid  1 mg Oral Daily  . irbesartan  75 mg Oral Daily  . LORazepam  0-4 mg Intravenous Q6H   Followed by  . [START ON 04/17/2014] LORazepam  0-4 mg Intravenous Q12H  . multivitamin with minerals  1 tablet Oral Daily  . nicotine  14 mg Transdermal Daily  . piperacillin-tazobactam (ZOSYN)  IV  3.375 g Intravenous Q8H  . thiamine  100 mg Oral Daily   Or  . thiamine  100 mg Intravenous Daily   Continuous Infusions: . sodium chloride 100 mL/hr at 04/15/14 1040    Active  Problems:   HTN (hypertension)   Personal history of prostate cancer s/p prostatectomy 2012   Hyponatremia   Alcohol abuse   Tobacco abuse   Colovesical fistula   Sepsis   Urinary tract infection   Elevated bilirubin    Time spent: >30 minutes    Barton Dubois  [facility] Pager [phone]. If 7PM-7AM, please contact night-coverage at [email or web address], password [facility] 04/15/2014, 12:48 PM  LOS: 1 day

## 2014-04-16 ENCOUNTER — Inpatient Hospital Stay (HOSPITAL_COMMUNITY): Payer: BC Managed Care – PPO

## 2014-04-16 DIAGNOSIS — I369 Nonrheumatic tricuspid valve disorder, unspecified: Secondary | ICD-10-CM

## 2014-04-16 DIAGNOSIS — N321 Vesicointestinal fistula: Secondary | ICD-10-CM

## 2014-04-16 DIAGNOSIS — N390 Urinary tract infection, site not specified: Secondary | ICD-10-CM

## 2014-04-16 DIAGNOSIS — A419 Sepsis, unspecified organism: Secondary | ICD-10-CM | POA: Diagnosis present

## 2014-04-16 HISTORY — DX: Sepsis, unspecified organism: A41.9

## 2014-04-16 HISTORY — DX: Urinary tract infection, site not specified: N39.0

## 2014-04-16 LAB — CBC
HEMATOCRIT: 37.7 % — AB (ref 39.0–52.0)
HEMOGLOBIN: 13.7 g/dL (ref 13.0–17.0)
MCH: 35 pg — AB (ref 26.0–34.0)
MCHC: 36.3 g/dL — ABNORMAL HIGH (ref 30.0–36.0)
MCV: 96.4 fL (ref 78.0–100.0)
PLATELETS: 119 10*3/uL — AB (ref 150–400)
RBC: 3.91 MIL/uL — ABNORMAL LOW (ref 4.22–5.81)
RDW: 12.2 % (ref 11.5–15.5)
WBC: 17.9 10*3/uL — AB (ref 4.0–10.5)

## 2014-04-16 LAB — BASIC METABOLIC PANEL
ANION GAP: 13 (ref 5–15)
BUN: 35 mg/dL — AB (ref 6–23)
CALCIUM: 7.5 mg/dL — AB (ref 8.4–10.5)
CHLORIDE: 88 meq/L — AB (ref 96–112)
CO2: 18 meq/L — AB (ref 19–32)
CREATININE: 2.08 mg/dL — AB (ref 0.50–1.35)
GFR calc Af Amer: 38 mL/min — ABNORMAL LOW (ref >90)
GFR calc non Af Amer: 32 mL/min — ABNORMAL LOW (ref >90)
Glucose, Bld: 123 mg/dL — ABNORMAL HIGH (ref 70–99)
Potassium: 4.8 mEq/L (ref 3.7–5.3)
Sodium: 119 mEq/L — CL (ref 137–147)

## 2014-04-16 LAB — OSMOLALITY, URINE: Osmolality, Ur: 214 mOsm/kg — ABNORMAL LOW (ref 390–1090)

## 2014-04-16 LAB — PRO B NATRIURETIC PEPTIDE: Pro B Natriuretic peptide (BNP): 5713 pg/mL — ABNORMAL HIGH (ref 0–125)

## 2014-04-16 MED ORDER — DEXTROSE 5 % IV SOLN
2.0000 g | INTRAVENOUS | Status: DC
  Administered 2014-04-16 – 2014-04-19 (×4): 2 g via INTRAVENOUS
  Filled 2014-04-16 (×6): qty 2

## 2014-04-16 MED ORDER — FUROSEMIDE 10 MG/ML IJ SOLN
40.0000 mg | Freq: Once | INTRAMUSCULAR | Status: AC
  Administered 2014-04-16: 40 mg via INTRAVENOUS
  Filled 2014-04-16: qty 4

## 2014-04-16 MED ORDER — BUDESONIDE 0.25 MG/2ML IN SUSP
0.2500 mg | Freq: Two times a day (BID) | RESPIRATORY_TRACT | Status: DC
  Administered 2014-04-16 – 2014-04-21 (×11): 0.25 mg via RESPIRATORY_TRACT
  Filled 2014-04-16 (×18): qty 2

## 2014-04-16 MED ORDER — FUROSEMIDE 10 MG/ML IJ SOLN
80.0000 mg | Freq: Four times a day (QID) | INTRAMUSCULAR | Status: AC
  Administered 2014-04-16 – 2014-04-17 (×4): 80 mg via INTRAVENOUS
  Filled 2014-04-16 (×4): qty 8

## 2014-04-16 MED ORDER — HYDROMORPHONE HCL PF 1 MG/ML IJ SOLN: 1.0000 mg | INTRAMUSCULAR | Status: DC | PRN

## 2014-04-16 MED ORDER — LORAZEPAM 1 MG PO TABS: 1.0000 mg | ORAL_TABLET | Freq: Once | ORAL | Status: DC

## 2014-04-16 MED ORDER — LORAZEPAM 2 MG/ML IJ SOLN
1.0000 mg | Freq: Once | INTRAMUSCULAR | Status: AC
  Administered 2014-04-16: 1 mg via INTRAVENOUS

## 2014-04-16 NOTE — Progress Notes (Signed)
*  PRELIMINARY RESULTS* Echocardiogram 2D Echocardiogram has been performed.  Leavy Cella 04/16/2014, 4:22 PM

## 2014-04-16 NOTE — Consult Note (Addendum)
Renal Service Consult Note [facility]  Calogero Boose 04/16/2014 Sol Blazing Requesting Physician: Dr Dyann Kief   Reason for Consult:  Fever and L sided flank pain, recently dx'd fistula between colon and bladder HPI: The patient is a 63 y.o. year-old with hx of prostate cancer s/p RP in 01/2011.  Also HTN and etoh abuse. Was admitted here in 06/2011 with T5 compression fx, back pain, and etoh withdrawal with DT's. Na was low at that time at 121 ascribed to meds and/or etoh abuse. In ED was found to be septic with UTI and was admitted.  Hx of UTI x 2 in recent months. Started on IV Rocephin.  BP's high on admit.  Na yesterday was 126 and today is 119.   CXR 2 days ago w early edema/vasc congestion, today's cxr with bilat pulm edema. Pt sob, coughing up frothy phlegm and sputum, coughing spells when he tries to lie flat.   Date  Serum Na Dec 2011 136 Jun 2012 133- 134 Nov 2012 121- 127 Aug 2015 124 > 126 > 119 (today)   ROS  neg  Chart review: 06/12 - Robot-assisted radical prostatectomy for prostate cancer 11/12 - AMS found in bathroom, severe back pain, high BP, hx HTN and pros Ca > w/u neg CT for dissection but did show T5 compression fracture.  Serum Na 121. Ortho recommended brace. No evidence cord compression.  Also PNA, HTN, low Na due to HCTZ or Tribenzor +/- etoh abuse.  Rx with demeclocycline for 10 days. ETOH abuse and w/d.  Had DT's with AMS.       Past Medical History  Past Medical History  Diagnosis Date  . Hypertension   . Cancer     Prostate surgery  . Headache(784.0)   . Pneumonia   . Diabetes mellitus   . 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   Marland Kitchen PNA (pneumonia) 06/26/2011  . Compression fracture of thoracic vertebra 06/26/2011  . Fistula, bladder    Past Surgical History  Past Surgical  History  Procedure Laterality Date  . Robot assisted laparoscopic radical prostatectomy  01/31/2011    Robotic-assisted laparoscopic radical retropubic  . Lipoma excision  2012    Dr Nonah Mattes   Family History History reviewed. No pertinent family history. Social History  reports that he has been smoking Cigarettes.  He has a 90 pack-year smoking history. He has never used smokeless tobacco. He reports that he drinks about 3.5 - 5 ounces of alcohol per week. He reports that he does not use illicit drugs. Allergies  Allergies  Allergen Reactions  . Nsaids     Kidney disease/failure  . Sulfa Antibiotics Other (See Comments)    headaches   Home medications Prior to Admission medications   Medication Sig Start Date End Date Taking? Authorizing Provider  amLODipine (NORVASC) 5 MG tablet Take 5 mg by mouth daily.   Yes Historical Provider, MD  atenolol (TENORMIN) 100 MG tablet Take 100 mg by mouth daily.  01/17/14  Yes Historical Provider, MD  doxazosin (CARDURA) 4 MG tablet Take 4 mg by mouth daily.  01/25/14  Yes Historical Provider, MD  furosemide (LASIX) 20 MG tablet Take 20 mg by mouth daily.  02/27/14  Yes Historical Provider, MD  Multiple Vitamins-Minerals (MULTIVITAMINS THER. W/MINERALS) TABS Take 1 tablet by mouth daily.  Yes Historical Provider, MD  zolpidem (AMBIEN) 5 MG tablet Take 5 mg by mouth at bedtime as needed for sleep.  12/31/13  Yes Historical Provider, MD  olmesartan (BENICAR) 40 MG tablet Take 1 tablet (40 mg total) by mouth daily. 07/02/11 07/01/12  Reyne Dumas, MD   Liver Function Tests  Recent Labs Lab 04/14/14 0655 04/15/14 0526  AST 14 11  ALT 8 6  ALKPHOS 58 45  BILITOT 1.7* 1.4*  PROT 6.7 6.1  ALBUMIN 3.4* 2.7*    Recent Labs Lab 04/14/14 0655  LIPASE 20   CBC  Recent Labs Lab 04/14/14 0655 04/15/14 0526 04/16/14 0450  WBC 17.5* 22.0* 17.9*  NEUTROABS 16.3*  --   --   HGB 16.2 14.6 13.7  HCT 45.5 41.1 37.7*  MCV 94.8 98.6 96.4  PLT  167 138* [id number]*   Basic Metabolic Panel  Recent Labs Lab 04/14/14 0655 04/15/14 0526 04/16/14 0450  NA 124* 126* 119*  K 4.3 5.1 4.8  CL 88* 92* 88*  CO2 21 19 18*  GLUCOSE 233* 173* 123*  BUN 15 24* 35*  CREATININE 1.17 1.95* 2.08*  CALCIUM 8.2* 7.4* 7.5*    Filed Vitals:   04/15/14 1615 04/15/14 2357 04/16/14 0705 04/16/14 1107  BP: 133/63 150/78 124/64 134/87  Pulse: 74 85 85 86  Temp: 98.1 F (36.7 C) 98.2 F (36.8 C) 97.6 F (36.4 C)   TempSrc: Oral Oral Oral   Resp: 22 23 24    Height:      Weight:      SpO2: 91% 89% 87%    Exam Coughing, uncomfortable, just got back from bathroom, tachypneic, not in distress though No rash, cyanosis or gangrene Sclera anicteric, throat clear +JVD Chest bilat basilar rales 1/3 up and diffuse exp wheezes all lung fields RRR no MRG  Abd protuberant, prob ascites, not sure, nontender, liver down 5 cm 1+ bilat pretib edema Neuro is alert and Ox 3   Creat 2.08, Na 119, CO2 18, BUN 35, Cl 88 Ca 7.5, AG 13, alb 2.7, LFT's wnl, Tprot 6.1 WBC 17- 22k, Hb 13, plt 119k UA >300 protein, 11-20 wbc, 3-6 rbc, many bacteria, +nit/LE, cloudy Urine osm - 214 mOsm/kg CXR 8/26 vasc congestion, early IS edema? CXR 8/28 > marked bilat pulm edema/ CHF Renal US 8/26  R 13.6 cm, no hydro, nl echo / L 12.5 cm no hydro, normal echo  Assessment: 1 Hyponatremia , hypervolemic - pt has pulm edema, possible CHF vs vol overload related to CKD; needs aggressive diuresis, have oredered IV lasix every 6 hours, fluid restrict to 1000 cc/ day for now, have d/w primary 2 Acute renal failure - creat was 1.17 two days ago and up to 2.0 today. Suspect due to decomp CHF. ECHO pending. BP's are [id number] systolic, no nephrotoxins noted.  3 ETOH abuse 4 Colovesicular fistula / UTI - on IV zosyn 5 Proteus bacteremia on IV abx , felt due to #4 6 HTN - BP's normal on atenolol and norvasc 7 Acute resp failure - pulm edema significant on today's film, would have low  threshold for sending to SDU if he worsens, use bipap, etc    Plan- he should diurese ok with IV lasix (80 mg q 6 hrs); also condom cath foley, stay in bed, cont to avoid nsaid, acei/arb, dye.  Stopped atenolol, BP may drop with diuresis. Will follow. Needs fluid restriction for next 24 hours until resp status stabilizing, clear liquid diet will not work, have  d/w primary.   Kelly Splinter MD (pgr) [phone]    ([id number] 04/16/2014, 2:15 PM

## 2014-04-16 NOTE — Progress Notes (Addendum)
Douglas Ballard  [birth year] [id number]  Patient Care Team: Stephens Shire, MD as PCP - General (Family Medicine) Bernestine Amass, MD as Consulting Physician (Urology) [name], MD as Consulting Physician (General Surgery) Johnn Hai, MD as Consulting Physician (Orthopedic Surgery)  This patient is a 63 y.o.male who calls today for surgical evaluation.   Reason for call: Discuss plan  Called by Dr. Dyann Kief with medicine.  Discussed complexity the case.  I discussed with [facility] MD on call.    I think we should continue the plan of allowing the patient to recover from his urosepsis and stabilize his numerous medical issues.  Once he several further weeks out, plan colonoscopy and then surgery the next day.  This would give the best chance to tolerate a bowel prep, stress of the  operation, and have a chance at primary anastomosis and avoid temporary colostomy.  Would like to have Urology place ureteral stents given history of prior prostatectomy.  If things fall apart and he does not rally, consider CT scan of abdomen and pelvis (probably no IV contrast given recent renal failure) and see if anything else needs to happen.  Question of abscess or obstruction.  Because his abdominal issues seem to be stabilizing, hold off for now.  Try and hold off on any surgery this admission given increase morbidity and mortality in his deconditioned state.  I will followup on Monday  Patient Active Problem List   Diagnosis Date Noted  . Sepsis due to urinary tract infection 04/16/2014  . Sepsis 04/14/2014  . Urinary tract infection 04/14/2014  . Elevated bilirubin 04/14/2014  . 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 Pneumaturia 03/24/2014  . Personal history of colonic polyps 03/24/2014  . Alcohol abuse 06/27/2011  . Tobacco abuse 06/27/2011  . HTN (hypertension) 06/26/2011  . Personal history of prostate cancer s/p prostatectomy 2012 06/26/2011  . Hyponatremia 06/26/2011    Past  Medical History  Diagnosis Date  . Hypertension   . Cancer     Prostate surgery  . Headache(784.0)   . Pneumonia   . Diabetes mellitus   . 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   Marland Kitchen PNA (pneumonia) 06/26/2011  . Compression fracture of thoracic vertebra 06/26/2011  . Fistula, bladder     Past Surgical History  Procedure Laterality Date  . Robot assisted laparoscopic radical prostatectomy  01/31/2011    Robotic-assisted laparoscopic radical retropubic  . Lipoma excision  2012    Dr Nonah Mattes    History   Social History  . Marital Status: Married    Spouse Name: N/A    Number of Children: N/A  . Years of Education: N/A   Occupational History  . Not on file.   Social History Main Topics  . Smoking status: Current Every Day Smoker -- 2.00 packs/day for 45 years    Types: Cigarettes  . Smokeless tobacco: Never Used  . Alcohol Use: 3.5 - 5.0 oz/week    7-10 drink(s) per week     Comment: vodka daily  . Drug Use: No  . Sexual Activity: Not Currently   Other Topics Concern  . Not on file   Social History Narrative  . No narrative on file    History reviewed. No pertinent family history.  Current Facility-Administered Medications  Medication Dose Route Frequency Provider Last Rate  Last Dose  . 0.9 %  sodium chloride infusion   Intravenous Continuous Barton Dubois, MD 50 mL/hr at 04/15/14 2136    . amLODipine (NORVASC) tablet 5 mg  5 mg Oral Daily Costin Karlyne Greenspan, MD   5 mg at 04/16/14 1110  . atenolol (TENORMIN) tablet 100 mg  100 mg Oral Daily Caren Griffins, MD   100 mg at 04/16/14 1110  . cefTRIAXone (ROCEPHIN) 2 g in dextrose 5 % 50 mL IVPB  2 g Intravenous Q24H Truman Hayward, MD      . doxazosin (CARDURA) tablet 4 mg  4 mg Oral Daily Costin Karlyne Greenspan, MD   4 mg at 04/16/14 1110  . folic acid (FOLVITE)  tablet 1 mg  1 mg Oral Daily Costin Karlyne Greenspan, MD   1 mg at 04/16/14 1110  . HYDROmorphone (DILAUDID) injection 1 mg  1 mg Intravenous Q3H PRN Barton Dubois, MD      . ipratropium-albuterol (DUONEB) 0.5-2.5 (3) MG/3ML nebulizer solution 3 mL  3 mL Nebulization Q4H PRN Barton Dubois, MD      . isosorbide-hydrALAZINE (BIDIL) 20-37.5 MG per tablet 1 tablet  1 tablet Oral BID Barton Dubois, MD   1 tablet at 04/16/14 1110  . LORazepam (ATIVAN) injection 0-4 mg  0-4 mg Intravenous Q6H Barton Dubois, MD   1 mg at 04/15/14 2136   Followed by  . [START ON 04/17/2014] LORazepam (ATIVAN) injection 0-4 mg  0-4 mg Intravenous Q12H Barton Dubois, MD      . LORazepam (ATIVAN) tablet 1 mg  1 mg Oral Q6H PRN Barton Dubois, MD       Or  . LORazepam (ATIVAN) injection 1 mg  1 mg Intravenous Q6H PRN Barton Dubois, MD   1 mg at 04/16/14 0353  . multivitamin with minerals tablet 1 tablet  1 tablet Oral Daily Costin Karlyne Greenspan, MD   1 tablet at 04/16/14 1110  . nicotine (NICODERM CQ - dosed in mg/24 hours) patch 14 mg  14 mg Transdermal Daily Barton Dubois, MD   14 mg at 04/16/14 1110  . ondansetron (ZOFRAN) injection 4 mg  4 mg Intravenous Q6H PRN Caren Griffins, MD   4 mg at 04/15/14 0533  . thiamine (VITAMIN B-1) tablet 100 mg  100 mg Oral Daily Costin Karlyne Greenspan, MD   100 mg at 04/16/14 1109   Or  . thiamine (B-1) injection 100 mg  100 mg Intravenous Daily Caren Griffins, MD   100 mg at 04/14/14 1256  . zolpidem (AMBIEN) tablet 5 mg  5 mg Oral QHS PRN Caren Griffins, MD   5 mg at 04/15/14 2255     Allergies  Allergen Reactions  . Nsaids     Kidney disease/failure  . Sulfa Antibiotics Other (See Comments)    headaches    BP 134/87  Pulse 86  Temp(Src) 97.6 F (36.4 C) (Oral)  Resp 24  Ht 5\' 9"  (1.753 m)  Wt 176 lb 12.9 oz (80.2 kg)  BMI 26.10 kg/m2  SpO2 87%  Dg Chest 2 View  04/16/2014   CLINICAL DATA:  63 year old male with shortness of breath.  EXAM: CHEST  2 VIEW  COMPARISON:  04/14/2014 and  prior chest radiographs dating back to 02/05/2007  FINDINGS: New bilateral central airspace opacities identified-likely moderate pulmonary edema.  Tiny bilateral pleural effusions are noted.  There is no evidence of pneumothorax or acute bony abnormality.  Upper limits normal heart size again noted.  IMPRESSION: New bilateral central airspace opacities likely representing moderate pulmonary edema. Tiny bilateral pleural effusions.   Electronically Signed   By: Hassan Rowan M.D.   On: 04/16/2014 08:43   US Renal  04/14/2014   CLINICAL DATA:  63 year old male with left costovertebral angle pain and tenderness. Initial encounter. Urinary tract infection.  EXAM: RENAL/URINARY TRACT ULTRASOUND COMPLETE  COMPARISON:  [facility] CT Abdomen and Pelvis 02/24/2014.  FINDINGS: Right Kidney:  Length: 13.6 cm. Small area of upper pole scarring demonstrated on image 3 today, series 2, image 26 of the comparison. Otherwise echogenicity within normal limits. No mass or hydronephrosis visualized.  Left Kidney:  Length: 12.5 cm. Echogenicity within normal limits. No mass or hydronephrosis visualized.  Bladder:  Decompressed, not visible.  IMPRESSION: Negative sonographic appearance of the kidneys. Decompressed bladder.   Electronically Signed   By: Lars Pinks M.D.   On: 04/14/2014 10:11   Dg Chest Port 1 View  04/14/2014   CLINICAL DATA:  Fever, sepsis  EXAM: PORTABLE CHEST - 1 VIEW  COMPARISON:  06/28/2011  FINDINGS: Cardiomegaly with pulmonary vascular congestion and possible mild interstitial edema. Mild bilateral lower lobe opacities, likely atelectasis. No focal consolidation. No pleural effusion or pneumothorax.  IMPRESSION: Screening loop pulmonary vascular congestion and possible mild interstitial edema.  Mild patchy bilateral lower lobe opacities, likely atelectasis.   Electronically Signed   By: Julian Hy M.D.   On: 04/14/2014 08:34    Note: This dictation was prepared with Dragon/digital  dictation along with [employer]. Any transcriptional errors that result from this process are unintentional.

## 2014-04-16 NOTE — Progress Notes (Signed)
INITIAL NUTRITION ASSESSMENT  DOCUMENTATION CODES Per approved criteria  -Not Applicable   INTERVENTION:  Discussed with renal MD.  Patient needs strict fluid restriction.  Current Full Liquid Diet to be discussed with hospitalist.  Spoke with wife.  Plans to be determined re: diet.  Gave coupons for Ensure/Boost along with tips to increase protein and calories when able to eat.  Discussed low sodium recommendations with wife and patient briefly.  RD to follow up this weekend.  NUTRITION DIAGNOSIS: Inadequate oral intake related to medical status as evidenced by report.   Goal: Intake of meals and supplements to meet >90% estimated needs.  Monitor:  Plan of care, labs, diet advancement, intake.  Reason for Assessment:   Consult from RN per wife's request for meal planning to boost calorie and nutritional intake.  63 y.o. male  Admitting Dx: Sepsis due to urinary tract infection  ASSESSMENT: Patient admitted with Urosepsis and ARF.  Newly diagnosed colovesicular fistula. Sodium 119 secondary to ETOH abuse and dehydration per MD.  Plan is to allow patient to recover from urosepsis and stabilize medical issues then plan for colonoscopy and surgery the following day with p to avoid temporary colostomy.  Currently with pulmonary edema, possible CHF vs vol overload related to CKD and needs aggressive diuresis.  -Patient is on a full liquid diet with very poor intake. -Strict fluid restriction needed. -Patient reports eating 2-2 1/2 meals daily until 1-2 days prior to admit.  Wife had been giving him Ensure. -Wife's niece is a RD in [place]. -Weight has been stable per patient.  Height: Ht Readings from Last 1 Encounters:  04/14/14 5\' 9"  (1.753 m)    Weight: Wt Readings from Last 1 Encounters:  04/14/14 176 lb 12.9 oz (80.2 kg)    Ideal Body Weight: 160 lbs  % Ideal Body Weight: 111  Wt Readings from Last 10 Encounters:  04/14/14 176 lb 12.9 oz (80.2 kg)  03/24/14  177 lb (80.287 kg)  07/02/11 167 lb 8.8 oz (76 kg)    Usual Body Weight: 177 3 weeks ago  % Usual Body Weight: 100  BMI:  Body mass index is 26.1 kg/(m^2).  Estimated Nutritional Needs: Kcal: 2000-2100 Protein: 90-100 gm  Fluid: strict fluid restriction  Skin: intact  Diet Order: Full Liquid  EDUCATION NEEDS: -Education needs addressed   Intake/Output Summary (Last 24 hours) at 04/16/14 1540 Last data filed at 04/16/14 0924  Gross per 24 hour  Intake   1910 ml  Output      0 ml  Net   1910 ml    Last BM: unknown  Labs:   Recent Labs Lab 04/14/14 0655 04/15/14 0526 04/16/14 0450  NA 124* 126* 119*  K 4.3 5.1 4.8  CL 88* 92* 88*  CO2 21 19 18*  BUN 15 24* 35*  CREATININE 1.17 1.95* 2.08*  CALCIUM 8.2* 7.4* 7.5*  GLUCOSE 233* 173* 123*    CBG (last 3)  No results found for this basename: GLUCAP,  in the last 72 hours  Scheduled Meds: . amLODipine  5 mg Oral Daily  . atenolol  100 mg Oral Daily  . budesonide (PULMICORT) nebulizer solution  0.25 mg Nebulization BID  . cefTRIAXone (ROCEPHIN)  IV  2 g Intravenous Q24H  . doxazosin  4 mg Oral Daily  . folic acid  1 mg Oral Daily  . isosorbide-hydrALAZINE  1 tablet Oral BID  . LORazepam  0-4 mg Intravenous Q6H   Followed by  . [  START ON 04/17/2014] LORazepam  0-4 mg Intravenous Q12H  . multivitamin with minerals  1 tablet Oral Daily  . nicotine  14 mg Transdermal Daily  . thiamine  100 mg Oral Daily   Or  . thiamine  100 mg Intravenous Daily    Continuous Infusions: . sodium chloride 50 mL/hr at 04/15/14 2136    Past Medical History  Diagnosis Date  . Hypertension   . Cancer     Prostate surgery  . Headache(784.0)   . Pneumonia   . Diabetes mellitus   . 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   Marland Kitchen PNA (pneumonia) 06/26/2011  . Compression  fracture of thoracic vertebra 06/26/2011  . Fistula, bladder     Past Surgical History  Procedure Laterality Date  . Robot assisted laparoscopic radical prostatectomy  01/31/2011    Robotic-assisted laparoscopic radical retropubic  . Lipoma excision  2012    Dr Nonah Mattes    Antonieta Iba, RD, LDN Clinical Inpatient Dietitian Pager:  [phone] Weekend and after hours pager:  [phone]

## 2014-04-16 NOTE — ED Provider Notes (Signed)
Medical screening examination/treatment/procedure(s) were conducted as a shared visit with non-physician practitioner(s) and myself.  I personally evaluated the patient during the encounter.   EKG Interpretation None       Leota Jacobsen, MD 04/16/14 [phone]

## 2014-04-16 NOTE — Consult Note (Signed)
Douglas  [address] [place], [place] [address] Phone: [phone] FAX: [phone]     Douglas Ballard  January 21, 1951 [id number]  CARE TEAM:  PCP: Douglas Shire, MD  Outpatient Care Team: Patient Care Team: Douglas Shire, MD as PCP - General (Family Medicine) Douglas Amass, MD as Consulting Physician (Urology) Douglas Hector, MD as Consulting Physician (General Surgery) Douglas Hai, MD as Consulting Physician (Orthopedic Surgery)  Inpatient Treatment Team: Treatment Team: Attending Provider: Barton Dubois, MD; Registered Nurse: Douglas Caddy, RN; Rounding Team: Douglas Bushman, MD; Consulting Physician: Douglas Hector, MD; Registered Nurse: Douglas Quant, RN; Technician: Douglas Ballard, NT; Technician: Douglas Ballard, NT; Technician: Douglas Ballard; Registered Nurse: Douglas Rutherford, RN; Registered Nurse: Douglas Duel, RN  Pleasant smoking male. Began to notice passing air with urination 4 months ago. Never had this before. No history of Crohn's. Did have a prostatectomy 3 years ago robotically for prostate cancer. No radiation. Relatively early Ballard. Negative lymph nodes. Saw his urologist. Cystoscopy done. Inflammation at the left dome of the bladder. CT scan showing gas in bladder and colon nearby with diverticulosis. Very suspicious for colovesicular fistula. Surgical consultation recommended. His wife and had surgery by Douglas. Brantley Ballard. They were interested in seeing him. Given the complexity of the case, patient sent to me.   Patient recalls colonoscopy over 5 years ago in town. Thinks it was Douglas. Juanita Ballard. Due for follow colonoscopy but wishes a different gastroenterologists. Recalls a colonoscopy when he lived in [place] 10 years ago. He thinks a polyp has been removed in the past. Normally has a bowel movement twice a day. Can walk 30-60 minutes without difficulty. The smoke 2 packs a day. No history  of heart or strokes or heart attacks. Not on oxygen.   No personal nor family history of GI/colon cancer, inflammatory bowel disease, irritable bowel syndrome, allergy such as Celiac Sprue, dietary/dairy problems, colitis, ulcers nor gastritis. No recent sick contacts/gastroenteritis. No travel outside the country. No changes in diet. No dysphagia to solids or liquids. No significant heartburn or reflux. No hematochezia, hematemesis, coffee ground emesis. No evidence of prior gastric/peptic ulceration.  Now readmitted with sepsis and dirty urinalysis suspicious for urosepsis.  Getting hydrated with IV antibiotics.  Concern for possible alcohol withdrawal so on lorazepam protocol.  Wife in room  Past Medical History  Diagnosis Date  . Hypertension   . Cancer     Prostate surgery  . Headache(784.0)   . Pneumonia   . Diabetes mellitus   . 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   Marland Kitchen PNA (pneumonia) 06/26/2011  . Compression fracture of thoracic vertebra 06/26/2011  . Fistula, bladder     Past Surgical History  Procedure Laterality Date  . Robot assisted laparoscopic radical prostatectomy  01/31/2011    Robotic-assisted laparoscopic radical retropubic  . Lipoma excision  2012    Douglas Douglas Ballard    History   Social History  . Marital Status: Married    Spouse Name: N/A    Number of Children: N/A  . Years of Education: N/A   Occupational History  . Not on file.   Social History Main Topics  . Smoking status: Current Every Day Smoker -- 2.00 packs/day for 45 years  Types: Cigarettes  . Smokeless tobacco: Never Used  . Alcohol Use: 3.5 - 5.0 oz/week    7-10 drink(s) per week     Comment: vodka daily  . Drug Use: No  . Sexual Activity: Not Currently   Other Topics Concern  . Not on file   Social History Narrative  . No  narrative on file    History reviewed. No pertinent family history.  Current Facility-Administered Medications  Medication Dose Route Frequency Provider Last Rate Last Dose  . 0.9 %  sodium chloride infusion   Intravenous Continuous Douglas Dubois, MD 50 mL/hr at 04/15/14 2136    . amLODipine (NORVASC) tablet 5 mg  5 mg Oral Daily Douglas Karlyne Greenspan, MD   5 mg at 04/15/14 0932  . atenolol (TENORMIN) tablet 100 mg  100 mg Oral Daily Douglas Griffins, MD   100 mg at 04/15/14 0932  . doxazosin (CARDURA) tablet 4 mg  4 mg Oral Daily Douglas Karlyne Greenspan, MD   4 mg at 04/15/14 0932  . folic acid (FOLVITE) tablet 1 mg  1 mg Oral Daily Douglas Karlyne Greenspan, MD   1 mg at 04/15/14 0932  . furosemide (LASIX) injection 40 mg  40 mg Intravenous Once Douglas Dubois, MD      . HYDROmorphone (DILAUDID) injection 1.5 mg  1.5 mg Intravenous Q3H PRN Douglas Griffins, MD   1.5 mg at 04/14/14 2027  . ipratropium-albuterol (DUONEB) 0.5-2.5 (3) MG/3ML nebulizer solution 3 mL  3 mL Nebulization Q4H PRN Douglas Dubois, MD      . isosorbide-hydrALAZINE (BIDIL) 20-37.5 MG per tablet 1 tablet  1 tablet Oral BID Douglas Dubois, MD   1 tablet at 04/15/14 2135  . LORazepam (ATIVAN) injection 0-4 mg  0-4 mg Intravenous Q6H Douglas Dubois, MD   1 mg at 04/15/14 2136   Followed by  . [START ON 04/17/2014] LORazepam (ATIVAN) injection 0-4 mg  0-4 mg Intravenous Q12H Douglas Dubois, MD      . LORazepam (ATIVAN) tablet 1 mg  1 mg Oral Q6H PRN Douglas Dubois, MD       Or  . LORazepam (ATIVAN) injection 1 mg  1 mg Intravenous Q6H PRN Douglas Dubois, MD   1 mg at 04/16/14 0353  . multivitamin with minerals tablet 1 tablet  1 tablet Oral Daily Douglas Karlyne Greenspan, MD   1 tablet at 04/15/14 0932  . nicotine (NICODERM CQ - dosed in mg/24 hours) patch 14 mg  14 mg Transdermal Daily Douglas Dubois, MD   14 mg at 04/15/14 1042  . ondansetron (ZOFRAN) injection 4 mg  4 mg Intravenous Q6H PRN Douglas Griffins, MD   4 mg at 04/15/14 0533  .  piperacillin-tazobactam (ZOSYN) IVPB 3.375 g  3.375 g Intravenous Q8H Douglas Ballard, Ballard   3.375 g at 04/16/14 0110  . thiamine (VITAMIN B-1) tablet 100 mg  100 mg Oral Daily Douglas Griffins, MD   100 mg at 04/15/14 0932   Or  . thiamine (B-1) injection 100 mg  100 mg Intravenous Daily Douglas Griffins, MD   100 mg at 04/14/14 1256  . zolpidem (AMBIEN) tablet 5 mg  5 mg Oral QHS PRN Douglas Griffins, MD   5 mg at 04/15/14 2255     Allergies  Allergen Reactions  . Sulfa Antibiotics Other (See Comments)    headaches    ROS: Constitutional:  No fevers, chills, sweats.  Weight stable Eyes:  No vision changes, No discharge  HENT:  No sore throats, nasal drainage Lymph: No neck swelling, No bruising easily Pulmonary:  No cough, productive sputum CV: No orthopnea, PND  No exertional chest/neck/shoulder/arm pain. GI: No personal nor family history of GI/colon cancer, inflammatory bowel disease, irritable bowel syndrome, allergy such as Celiac Sprue, dietary/dairy problems, colitis, ulcers nor gastritis.  No recent sick contacts/gastroenteritis.  No travel outside the country.  No changes in diet. Renal: No UTIs, No hematuria Genital:  No drainage, bleeding, masses Musculoskeletal: No severe joint pain.  Good ROM major joints Skin:  No sores or lesions.  No rashes Heme/Lymph:  No easy bleeding.  No swollen lymph nodes Neuro: No focal weakness/numbness.  No seizures Psych: No suicidal ideation.  No hallucinations  BP 124/64  Pulse 85  Temp(Src) 97.6 F (36.4 C) (Oral)  Resp 24  Ht '5\' 9"'  (1.753 m)  Wt 176 lb 12.9 oz (80.2 kg)  BMI 26.10 kg/m2  SpO2 87%  Physical Exam: General: Pt resting in no major acute distress Eyes: PERRL, normal EOM. Sclera nonicteric Neuro: CN II-XII intact w/o focal sensory/motor deficits. Lymph: No head/neck/groin lymphadenopathy Psych:  No delerium/psychosis/paranoia HENT: Normocephalic, Mucus membranes moist.  No thrush Neck: Supple, No tracheal  deviation Chest: No pain.  Good respiratory excursion.  Mild inc RR.  Dec BS at bases CV:  Pulses intact.  Regular rhythm Abdomen: Soft,  Mod distended.  Mildly tender suprapubic.  No incarcerated hernias. Ext:  SCDs BLE.  No significant edema.  No cyanosis Skin: No petechiae / purpurea.  No major sores Musculoskeletal: No severe joint pain.  Good ROM major joints   Results:   Labs: Results for orders placed during the hospital encounter of 04/14/14 (from the past 48 hour(s))  URINE CULTURE     Status: None   Collection Time    04/14/14  7:59 AM      Result Value Ref Range   Specimen Description IN/OUT CATH URINE     Special Requests NONE     Culture  Setup Time       Value: 04/14/2014 11:25     Performed at [facility] PENDING     Culture       Value: Culture reincubated for better growth     Performed at [facility], ROUTINE W REFLEX MICROSCOPIC     Status: Abnormal   Collection Time    04/14/14  7:59 AM      Result Value Ref Range   Color, Urine ORANGE (*) YELLOW   Comment: BIOCHEMICALS MAY BE AFFECTED BY COLOR   APPearance CLOUDY (*) CLEAR   Specific Gravity, Urine 1.019  1.005 - 1.030   pH 5.5  5.0 - 8.0   Glucose, UA 100 (*) NEGATIVE mg/dL   Hgb urine dipstick SMALL (*) NEGATIVE   Bilirubin Urine SMALL (*) NEGATIVE   Ketones, ur NEGATIVE  NEGATIVE mg/dL   Protein, ur >300 (*) NEGATIVE mg/dL   Urobilinogen, UA 1.0  0.0 - 1.0 mg/dL   Nitrite POSITIVE (*) NEGATIVE   Leukocytes, UA MODERATE (*) NEGATIVE  URINE MICROSCOPIC-ADD ON     Status: Abnormal   Collection Time    04/14/14  7:59 AM      Result Value Ref Range   WBC, UA 11-20  <3 WBC/hpf   RBC / HPF 3-6  <3 RBC/hpf   Bacteria, UA MANY (*) RARE  CBC     Status: Abnormal  Collection Time    04/15/14  5:26 AM      Result Value Ref Range   WBC 22.0 (*) 4.0 - 10.5 K/uL   RBC 4.17 (*) 4.22 - 5.81 MIL/uL   Hemoglobin 14.6  13.0 - 17.0 g/dL    HCT 41.1  39.0 - 52.0 %   MCV 98.6  78.0 - 100.0 fL   MCH 35.0 (*) 26.0 - 34.0 pg   MCHC 35.5  30.0 - 36.0 g/dL   RDW 12.4  11.5 - 15.5 %   Platelets 138 (*) 150 - 400 K/uL  COMPREHENSIVE METABOLIC PANEL     Status: Abnormal   Collection Time    04/15/14  5:26 AM      Result Value Ref Range   Sodium 126 (*) 137 - 147 mEq/L   Potassium 5.1  3.7 - 5.3 mEq/L   Chloride 92 (*) 96 - 112 mEq/L   CO2 19  19 - 32 mEq/L   Glucose, Bld 173 (*) 70 - 99 mg/dL   BUN 24 (*) 6 - 23 mg/dL   Creatinine, Ser 1.95 (*) 0.50 - 1.35 mg/dL   Comment: DELTA CHECK NOTED   Calcium 7.4 (*) 8.4 - 10.5 mg/dL   Total Protein 6.1  6.0 - 8.3 g/dL   Albumin 2.7 (*) 3.5 - 5.2 g/dL   AST 11  0 - 37 U/L   ALT 6  0 - 53 U/L   Alkaline Phosphatase 45  39 - 117 U/L   Total Bilirubin 1.4 (*) 0.3 - 1.2 mg/dL   GFR calc non Af Amer 35 (*) >90 mL/min   GFR calc Af Amer 41 (*) >90 mL/min   Comment: (NOTE)     The eGFR has been calculated using the CKD EPI equation.     This calculation has not been validated in all clinical situations.     eGFR's persistently <90 mL/min signify possible Chronic Kidney     Disease.   Anion gap 15  5 - 15  CBC     Status: Abnormal   Collection Time    04/16/14  4:50 AM      Result Value Ref Range   WBC 17.9 (*) 4.0 - 10.5 K/uL   RBC 3.91 (*) 4.22 - 5.81 MIL/uL   Hemoglobin 13.7  13.0 - 17.0 g/dL   HCT 37.7 (*) 39.0 - 52.0 %   MCV 96.4  78.0 - 100.0 fL   MCH 35.0 (*) 26.0 - 34.0 pg   MCHC 36.3 (*) 30.0 - 36.0 g/dL   RDW 12.2  11.5 - 15.5 %   Platelets 119 (*) 150 - 400 K/uL   Comment: SPECIMEN CHECKED FOR CLOTS     REPEATED TO VERIFY     PLATELET COUNT CONFIRMED BY SMEAR  BASIC METABOLIC PANEL     Status: Abnormal   Collection Time    04/16/14  4:50 AM      Result Value Ref Range   Sodium 119 (*) 137 - 147 mEq/L   Comment: REPEATED TO VERIFY     DELTA CHECK NOTED     CRITICAL RESULT CALLED TO, READ BACK BY AND VERIFIED WITH:     T NICHOLS AT 0544 ON 08.28.2015 BY  NBROOKS   Potassium 4.8  3.7 - 5.3 mEq/L   Chloride 88 (*) 96 - 112 mEq/L   CO2 18 (*) 19 - 32 mEq/L   Glucose, Bld 123 (*) 70 - 99 mg/dL   BUN 35 (*) 6 -  23 mg/dL   Creatinine, Ser 2.08 (*) 0.50 - 1.35 mg/dL   Calcium 7.5 (*) 8.4 - 10.5 mg/dL   GFR calc non Af Amer 32 (*) >90 mL/min   GFR calc Af Amer 38 (*) >90 mL/min   Comment: (NOTE)     The eGFR has been calculated using the CKD EPI equation.     This calculation has not been validated in all clinical situations.     eGFR's persistently <90 mL/min signify possible Chronic Kidney     Disease.   Anion gap 13  5 - 15    Imaging / Studies: US Renal  04/14/2014   CLINICAL DATA:  63 year old male with left costovertebral angle pain and tenderness. Initial encounter. Urinary tract infection.  EXAM: RENAL/URINARY TRACT ULTRASOUND COMPLETE  COMPARISON:  [facility] CT Abdomen and Pelvis 02/24/2014.  FINDINGS: Right Kidney:  Length: 13.6 cm. Small area of upper pole scarring demonstrated on image 3 today, series 2, image 26 of the comparison. Otherwise echogenicity within normal limits. No mass or hydronephrosis visualized.  Left Kidney:  Length: 12.5 cm. Echogenicity within normal limits. No mass or hydronephrosis visualized.  Bladder:  Decompressed, not visible.  IMPRESSION: Negative sonographic appearance of the kidneys. Decompressed bladder.   Electronically Signed   By: Lars Pinks M.D.   On: 04/14/2014 10:11   Dg Chest Port 1 Ballard  04/14/2014   CLINICAL DATA:  Fever, sepsis  EXAM: PORTABLE CHEST - 1 Ballard  COMPARISON:  06/28/2011  FINDINGS: Cardiomegaly with pulmonary vascular congestion and possible mild interstitial edema. Mild bilateral lower lobe opacities, likely atelectasis. No focal consolidation. No pleural effusion or pneumothorax.  IMPRESSION: Screening loop pulmonary vascular congestion and possible mild interstitial edema.  Mild patchy bilateral lower lobe opacities, likely atelectasis.   Electronically Signed   By:  Julian Hy M.D.   On: 04/14/2014 08:34    CLINICAL DATA: History of prostate cancer. Status post prostatectomy. Chronic cystitis.  EXAM: CT ABDOMEN AND PELVIS WITH CONTRAST  TECHNIQUE: Multidetector CT imaging of the abdomen and pelvis was performed using the standard protocol following bolus administration of intravenous contrast.  CONTRAST: 100 cc Isovue 300.  COMPARISON: [facility] CT from 02/07/07.  FINDINGS: Lung Bases: Unremarkable.  Liver: Steatosis without focal parenchymal abnormality.  Spleen: Normal.  Stomach: Normal.  Pancreas: Normal.  Gallbladder/Biliary: No evidence gallstones. No intra or extrahepatic biliary duct dilatation.  Kidneys/Adrenals: No adrenal nodule or mass. No definite stones in either kidney or ureter. There is focally increased attenuation in the renal papillae of both kidneys which may be related to highly concentrated urine or Randal's plaques. The density of the papillae is not as high or extensive as typically seen for medullary nephrocalcinosis. Imaging after IV contrast administration shows some scattered areas focal cortical scarring in the left kidney. There is no enhancing renal mass. No hydronephrosis. No collecting system abnormality is seen on delayed imaging. Left ureter is well opacified throughout the has normal features. The right ureter is well opacified except for some segments in the distal third. The opacified segments are normal in appearance. There is diffuse mild wall irregularity in the bladder. There is some focal thickening of the bladder wall along the left aspect of the dome. The abnormal sigmoid colon (see below) passes immediately over this region in the bladder and is contiguous with the bladder. There is an air bubble that appears to be positioned immediately adjacent to or potentially in the bladder wall at this level (see coronal  image 86 of series 953 for example). This appearance is  highly suggestive of the presence of a colovesical fistula although slice thickness of this exam limits resolution on the coronal and sagittal 2D reformation, somewhat hindering definitive assessment. The patient does have gas in the bladder lumen which would also be consistent with the presence of a colovesical fistula.  Bowel Loops: No small bowel obstruction. No evidence for small bowel wall thickening. The terminal ileum and the appendix are normal. Right and transverse colon have normal imaging features. Diverticular changes are seen in the left colon and there is relatively diffuse wall thickening in the sigmoid colon likely related to diverticulosis. As described above, the abnormal. Sigmoid colon is contiguous with the bladder dome.  Nodes: 1.3 x 2.0 cm left para-aortic lymph node seen on image 30 of series 2 measured 0.9 x 1.5 cm on the previous study. 1.3 x 2.1 cm left para-aortic lymph node on image 36 was 1.3 x 2.1 cm previously. No gastrohepatic or hepatoduodenal ligament lymphadenopathy. No evidence for pelvic sidewall lymphadenopathy.  Vasculature: Atherosclerotic calcification is noted in the wall of the abdominal aorta without aneurysm.  Pelvic Genitourinary: Prostate gland is surgically absent.  Bones/Musculoskeletal: Bone windows reveal no worrisome lytic or sclerotic osseous lesions.  Body Wall: Small bilateral inguinal hernias, right greater than left, contain only fat.  Other: There is a trace amount of intraperitoneal free fluid. There is edema/ inflammation in the perirenal fat bilaterally which can be an nonacute finding. Similar features were seen on a CT chest from 06/26/2011. This retroperitoneal fat stranding extends caudally in the retroperitoneal space into the pelvic sidewalls bilaterally.  IMPRESSION: 1. The most prominent finding on this study is an apparent colovesical fistula between the sigmoid colon and left bladder dome. Resolution of  coronal and sagittal reformations is limited by slice thickness on this exam which does limit definitive assessment by CT. 2. Mild retroperitoneal lymphadenopathy in the abdomen, involving the left para-aortic space. Given that this was present on the study from 7 years ago and is stable to only minimally increased, it is most likely benign and chronic. 3. No mass lesion in either kidney. There is no evidence for a collecting system or ureteral abnormality on either side.   Electronically Signed By: Misty Stanley M.D. On: 02/24/2014 12:06   Medications / Allergies: per chart  Antibiotics: Anti-infectives   Start     Dose/Rate Route Frequency Ordered Stop   04/15/14 0800  cefTRIAXone (ROCEPHIN) 2 g in dextrose 5 % 50 mL IVPB  Status:  Discontinued     2 g 100 mL/hr over 30 Minutes Intravenous Every 24 hours 04/14/14 0758 04/15/14 0000   04/15/14 0030  piperacillin-tazobactam (ZOSYN) IVPB 3.375 g     3.375 g 12.5 mL/hr over 240 Minutes Intravenous Every 8 hours 04/15/14 0013     04/14/14 0730  cefTRIAXone (ROCEPHIN) 2 g in dextrose 5 % 50 mL IVPB     2 g 100 mL/hr over 30 Minutes Intravenous  Once 04/14/14 0724 04/14/14 0900      Assessment  Herve Noberto Retort  63 y.o. 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  Urinary sepsis from colovesical fistula  Patient history of colon polyps due for colonoscopy.   Ballard:  Agree with IV antibiotics.  Treat hyponatremia.  Strict I&O.  Follow creatinine.  At some point he would benefit from colonoscopy to evaluate his colon.  Tentative Ballard is next week.  The needle a week or so until he comes over the  surgery.  At some point he will benefit from colectomy with partial cystectomy.  Would like to get urologic stenting and possible intraoperative help depending how things go.  Looks like it is on the left dome of the bladder.  Lipoma from surgery now at this point as the patient require a resection with colostomy.  Stop smoking.  -VTE prophylaxis- SCDs, etc  -mobilize as tolerated to help recovery    Douglas Ballard, M.D., F.A.C.S. Gastrointestinal and Minimally Invasive Surgery [facility] [address] [place], [place] [address] [phone] Main / Paging   04/16/2014  Note: Portions of this report may have been transcribed using voice recognition software. Every effort was made to ensure accuracy; however, inadvertent computerized transcription errors may be present.   Any transcriptional errors that result from this process are unintentional.

## 2014-04-16 NOTE — Progress Notes (Signed)
[facility] PROGRESS NOTE  Douglas Ballard [id number] DOB: Apr 23, 1951 DOA: 04/14/2014 PCP: Stephens Shire, MD  Assessment/Plan: 1-sepsis due to UTI and ? bacteremia: E. 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 and UTI -follow sensitivity  -continue rocephin as recommended by ID -base on clinical response might required repeat CT scan of abdomen with oral contrast   4-hyponatremia: secondary to alcohol abuse and dehydration -mild improved with IVF's initially; with subsequent worsening renal function and development of vascular congestion/mild pulmonary edema -will hold IVF's resuscitation -check TSH, cortisol level and 2-D echo -renal service consulted; will follow rec's  5-HTN: will continue norvasc and bidil for now; hold avapro due to ARF  6-alcohol abuse with delirium/withdrawal: continue CIWA, thiamine and folic acid -cessation counseling provided  7-tobacco abuse: will start nicotine patch -cessation counseling provided  8-leukocytosis: due to #1 -continue antibiotics -follow WBC's trend; slowly trending down -patient is afebrile  9-acute resp failure with hypoxia: due to vascular congestion and mild pulmonary edema -check 2-D echo -as needed supplemental oxygen -PRN nebulizer treatment  Code Status: Full Family Communication: wife at bedside  Disposition Plan: to be  determine   Consultants:  [facility]S  ID (Dr. Tommy Medal curbside for antibiotics choice and length of therapy)  Procedures:  See below for x-ray reports   Antibiotics:  zosyn 8/26-8/28  Rocephin 8/28  HPI/Subjective: Feeling better. Slightly lethargic and slow to response, but appropriate. Patient is complaining of mild SOB and his O2 sat is 88-89% on RA.  Objective: Filed Vitals:   04/16/14 1107  BP: 134/87  Pulse: 86  Temp:   Resp:     Intake/Output Summary (Last 24 hours) at 04/16/14 1351 Last data filed at 04/16/14 0924  Gross per 24 hour  Intake   2030 ml  Output      0 ml  Net   2030 ml   Filed Weights   04/14/14 0637 04/14/14 1018  Weight: 79.833 kg (176 lb) 80.2 kg (176 lb 12.9 oz)    Exam:   General:  Feeling better; but slightly lethargic; no nausea, vomiting or abd pain. No fever. Denies chest pain. Feeling slightly SOB.  Cardiovascular: S1 and S2, no rubs or gallops  Respiratory: mild exp wheezing; decrease BS at bases  Abdomen: no distension, positive BS, no guarding  Musculoskeletal: no edema, cyanosis or clubbing  Data Reviewed: Basic Metabolic Panel:  Recent Labs Lab 04/14/14 0655 04/15/14 0526 04/16/14 0450  NA 124* 126* 119*  K 4.3 5.1 4.8  CL 88* 92* 88*  CO2 21 19 18*  GLUCOSE 233* 173* 123*  BUN 15 24* 35*  CREATININE 1.17 1.95* 2.08*  CALCIUM 8.2* 7.4* 7.5*   Liver Function Tests:  Recent Labs Lab 04/14/14 0655 04/15/14 0526  AST 14 11  ALT 8 6  ALKPHOS 58 45  BILITOT 1.7* 1.4*  PROT 6.7 6.1  ALBUMIN 3.4* 2.7*    Recent Labs Lab 04/14/14 0655  LIPASE 20   CBC:  Recent Labs Lab 04/14/14 0655 04/15/14 0526 04/16/14  0450  WBC 17.5* 22.0* 17.9*  NEUTROABS 16.3*  --   --   HGB 16.2 14.6 13.7  HCT 45.5 41.1 37.7*  MCV 94.8 98.6 96.4  PLT 167 138* 119*     Recent Results (from the past 240 hour(s))  CULTURE, BLOOD (ROUTINE X 2)     Status: None   Collection Time    04/14/14  6:59 AM      Result Value  Ref Range Status   Specimen Description BLOOD RIGHT FOREARM   Final   Special Requests BOTTLES DRAWN AEROBIC AND ANAEROBIC 5ML   Final   Culture  Setup Time     Final   Value: 04/14/2014 11:11     Performed at [facility]   Culture     Final   Value:        BLOOD CULTURE RECEIVED NO GROWTH TO DATE CULTURE WILL BE HELD FOR 5 DAYS BEFORE ISSUING A FINAL NEGATIVE REPORT     Performed at [facility]   Report Status PENDING   Incomplete  CULTURE, BLOOD (ROUTINE X 2)     Status: None   Collection Time    04/14/14  7:36 AM      Result Value Ref Range Status   Specimen Description BLOOD LEFT ANTECUBITAL   Final   Special Requests BOTTLES DRAWN AEROBIC AND ANAEROBIC 5ML   Final   Culture  Setup Time     Final   Value: 04/14/2014 11:12     Performed at [facility]   Culture     Final   Value: PROTEUS MIRABILIS     Note: Gram Stain Report Called to,Read Back By and Verified With: [name] ON 04/14/2014 AT 11:23P BY [name]     Performed at [facility]   Report Status PENDING   Incomplete  URINE CULTURE     Status: None   Collection Time    04/14/14  7:59 AM      Result Value Ref Range Status   Specimen Description IN/OUT CATH URINE   Final   Special Requests NONE   Final   Culture  Setup Time     Final   Value: 04/14/2014 11:25     Performed at [place]     Final   Value: >=100,000 COLONIES/ML     Performed at [facility]   Culture     Final   Value: ESCHERICHIA COLI     Performed at [facility]   Report Status PENDING   Incomplete     Studies: Dg Chest 2 View  04/16/2014   CLINICAL DATA:  63 year old male with shortness of breath.  EXAM: CHEST  2 VIEW  COMPARISON:  04/14/2014 and prior chest radiographs dating back to 02/05/2007  FINDINGS: New bilateral central airspace opacities identified-likely moderate pulmonary edema.  Tiny bilateral pleural effusions are noted.  There is no evidence of  pneumothorax or acute bony abnormality.  Upper limits normal heart size again noted.  IMPRESSION: New bilateral central airspace opacities likely representing moderate pulmonary edema. Tiny bilateral pleural effusions.   Electronically Signed   By: Hassan Rowan M.D.   On: 04/16/2014 08:43    Scheduled Meds: . amLODipine  5 mg Oral Daily  . atenolol  100 mg Oral Daily  . cefTRIAXone (ROCEPHIN)  IV  2 g Intravenous Q24H  . doxazosin  4 mg Oral Daily  . folic acid  1 mg Oral  Daily  . isosorbide-hydrALAZINE  1 tablet Oral BID  . LORazepam  0-4 mg Intravenous Q6H   Followed by  . [START ON 04/17/2014] LORazepam  0-4 mg Intravenous Q12H  . multivitamin with minerals  1 tablet Oral Daily  . nicotine  14 mg Transdermal Daily  . thiamine  100 mg Oral Daily   Or  . thiamine  100 mg Intravenous Daily   Continuous Infusions: . sodium chloride 50 mL/hr at 04/15/14 2136    Principal Problem:   Sepsis due to urinary tract infection Active Problems:   HTN (hypertension)   Personal history of prostate cancer s/p prostatectomy 2012   Hyponatremia   Alcohol abuse   Tobacco abuse   Colovesical fistula   Sepsis   Urinary tract infection   Elevated bilirubin    Time spent: > 30 minutes    Barton Dubois  [facility] Pager [phone]. If 7PM-7AM, please contact night-coverage at [email or web address], password [facility] 04/16/2014, 1:51 PM  LOS: 2 days

## 2014-04-17 LAB — TSH: TSH: 0.639 u[IU]/mL (ref 0.350–4.500)

## 2014-04-17 LAB — CULTURE, BLOOD (ROUTINE X 2)

## 2014-04-17 LAB — PROTEIN / CREATININE RATIO, URINE
Creatinine, Urine: 38.65 mg/dL
PROTEIN CREATININE RATIO: 1.13 — AB (ref 0.00–0.15)
Total Protein, Urine: 43.6 mg/dL

## 2014-04-17 LAB — BASIC METABOLIC PANEL
Anion gap: 19 — ABNORMAL HIGH (ref 5–15)
BUN: 42 mg/dL — AB (ref 6–23)
CALCIUM: 7.9 mg/dL — AB (ref 8.4–10.5)
CO2: 18 mEq/L — ABNORMAL LOW (ref 19–32)
CREATININE: 1.9 mg/dL — AB (ref 0.50–1.35)
Chloride: 90 mEq/L — ABNORMAL LOW (ref 96–112)
GFR calc Af Amer: 42 mL/min — ABNORMAL LOW (ref >90)
GFR, EST NON AFRICAN AMERICAN: 36 mL/min — AB (ref >90)
GLUCOSE: 131 mg/dL — AB (ref 70–99)
POTASSIUM: 3.6 meq/L — AB (ref 3.7–5.3)
SODIUM: 127 meq/L — AB (ref 137–147)

## 2014-04-17 LAB — CORTISOL: Cortisol, Plasma: 22.7 ug/dL

## 2014-04-17 MED ORDER — SACCHAROMYCES BOULARDII 250 MG PO CAPS
250.0000 mg | ORAL_CAPSULE | Freq: Two times a day (BID) | ORAL | Status: DC
  Administered 2014-04-17 – 2014-04-23 (×12): 250 mg via ORAL
  Filled 2014-04-17 (×14): qty 1

## 2014-04-17 MED ORDER — NYSTATIN 100000 UNIT/ML MT SUSP
5.0000 mL | Freq: Four times a day (QID) | OROMUCOSAL | Status: DC
  Administered 2014-04-17 – 2014-04-23 (×23): 500000 [IU] via ORAL
  Filled 2014-04-17 (×27): qty 5

## 2014-04-17 MED ORDER — SODIUM CHLORIDE 0.9 % IJ SOLN
3.0000 mL | Freq: Two times a day (BID) | INTRAMUSCULAR | Status: DC
  Administered 2014-04-17 – 2014-04-23 (×9): 3 mL via INTRAVENOUS

## 2014-04-17 MED ORDER — POTASSIUM CHLORIDE CRYS ER 20 MEQ PO TBCR
40.0000 meq | EXTENDED_RELEASE_TABLET | ORAL | Status: AC
  Administered 2014-04-17 (×3): 40 meq via ORAL
  Filled 2014-04-17 (×3): qty 2

## 2014-04-17 MED ORDER — FUROSEMIDE 10 MG/ML IJ SOLN
80.0000 mg | Freq: Three times a day (TID) | INTRAMUSCULAR | Status: DC
  Administered 2014-04-17 – 2014-04-18 (×2): 80 mg via INTRAVENOUS
  Filled 2014-04-17 (×3): qty 8

## 2014-04-17 MED ORDER — TAMSULOSIN HCL 0.4 MG PO CAPS
0.4000 mg | ORAL_CAPSULE | Freq: Every day | ORAL | Status: DC
  Administered 2014-04-17 – 2014-04-22 (×6): 0.4 mg via ORAL
  Filled 2014-04-17 (×7): qty 1

## 2014-04-17 NOTE — Progress Notes (Signed)
[facility] PROGRESS NOTE  Brit Hermosillo [id number] DOB: Nov 04, 1950 DOA: 04/14/2014 PCP: Stephens Shire, MD  Assessment/Plan: 1-sepsis due to UTI and ? bacteremia: E. 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  2-acute renal failure: secondary to UTI, NSAID's and continue use of avapro -IVF's changed to [place] due to vascular congestion, SOB and mild pulmonary edema -avapro continue to be on hold -follow renal function trend -will follow nephrology rec's -continue treatment of UTI  3-bacteremia due to proteus mirabilis: most likely translocation from colo-vesicula fistula and UTI -follow sensitivity  -continue rocephin as recommended by ID -base on clinical response might required repeat CT scan of abdomen with oral contrast   4-hyponatremia: secondary to alcohol abuse and dehydration initially; then secondary to hypervolemic state  -mild improved with IVF's initially; with subsequent worsening renal function and development of vascular congestion/mild pulmonary edema -will hold IVF's resuscitation; restrict IVF;s to 1.5 L daily -will continue lasix  5-HTN: will continue norvasc and bidil for now; hold avapro due to ARF -lasix will also help with BP  6-alcohol abuse with delirium/withdrawal: continue CIWA, thiamine and folic acid -cessation counseling provided -improved mentation today  7-tobacco abuse: will start nicotine patch -cessation counseling provided  8-leukocytosis: due to #1 -continue antibiotics -follow WBC's trend; slowly trending down -patient is afebrile  9-acute resp failure with hypoxia: due to vascular congestion and pulmonary edema -2-D echo with preserved EF and no WMA. Presumed diastolic HF given uncontrolled hypertension and alcohol abuse. There is LVH appreciated on echo. -as needed supplemental  oxygen -PRN nebulizer treatment -continue lasix  10-BPH: will continue flomax  11-oral candidiasis: will start patient on nystatin  12-functional diarrhea: due to use of antibiotics -will start florastor -if continue or worsen will check for c. diff Code Status: Full Family Communication: wife at bedside  Disposition Plan: to be determine   Consultants:  [facility]  ID (Dr. Tommy Medal curbside for antibiotics choice and length of therapy)  Procedures:  See below for x-ray reports   Antibiotics:  zosyn 8/26-8/28  Rocephin 8/28  HPI/Subjective:  Feeling better and breathing easier. AAOX3 (per family members his mentation is now at baseline) good insight of his problems after explanation. No fever, no nausea, no vomiting and no abd pain.  Objective: Filed Vitals:   04/17/14 1315  BP: 135/87  Pulse: 114  Temp: 99.7 F (37.6 C)  Resp: 22    Intake/Output Summary (Last 24 hours) at 04/17/14 1459 Last data filed at 04/17/14 0900  Gross per 24 hour  Intake    360 ml  Output   2750 ml  Net  -2390 ml   Filed Weights   04/14/14 0637 04/14/14 1018 04/17/14 0510  Weight: 79.833 kg (176 lb) 80.2 kg (176 lb 12.9 oz) 78.835 kg (173 lb 12.8 oz)    Exam:   General:  Feeling better and breathing easier. AAOX3 (per family members his mentation is now at baseline) good insight of his problems after explanation. No fever, no nausea, no vomiting and no abd pain.  Cardiovascular: tachycardic, S1 and S2, no rubs or gallops  Respiratory: mild exp wheezing; mild crackles at bases  Abdomen: no distension, positive BS, no guarding  Musculoskeletal: no edema, cyanosis or clubbing  Data Reviewed: Basic Metabolic Panel:  Recent Labs Lab 04/14/14 0655 04/15/14 0526 04/16/14 0450 04/17/14 0404  NA 124* 126* 119* 127*  K 4.3 5.1 4.8 3.6*  CL 88* 92* 88* 90*  CO2 21 19 18* 18*  GLUCOSE 233* 173* 123* 131*  BUN 15 24* 35* 42*  CREATININE 1.17 1.95* 2.08* 1.90*  CALCIUM 8.2*  7.4* 7.5* 7.9*   Liver Function Tests:  Recent Labs Lab 04/14/14 0655 04/15/14 0526  AST 14 11  ALT 8 6  ALKPHOS 58 45  BILITOT 1.7* 1.4*  PROT 6.7 6.1  ALBUMIN 3.4* 2.7*    Recent Labs Lab 04/14/14 0655  LIPASE 20   CBC:  Recent Labs Lab 04/14/14 0655 04/15/14 0526 04/16/14 0450  WBC 17.5* 22.0* 17.9*  NEUTROABS 16.3*  --   --   HGB 16.2 14.6 13.7  HCT 45.5 41.1 37.7*  MCV 94.8 98.6 96.4  PLT 167 138* 119*     Recent Results (from the past 240 hour(s))  CULTURE, BLOOD (ROUTINE X 2)     Status: None   Collection Time    04/14/14  6:59 AM      Result Value Ref Range Status   Specimen Description BLOOD RIGHT FOREARM   Final   Special Requests BOTTLES DRAWN AEROBIC AND ANAEROBIC 5ML   Final   Culture  Setup Time     Final   Value: 04/14/2014 11:11     Performed at [facility]   Culture     Final   Value:        BLOOD CULTURE RECEIVED NO GROWTH TO DATE CULTURE WILL BE HELD FOR 5 DAYS BEFORE ISSUING A FINAL NEGATIVE REPORT     Performed at [facility]   Report Status PENDING   Incomplete  CULTURE, BLOOD (ROUTINE X 2)     Status: None   Collection Time    04/14/14  7:36 AM      Result Value Ref Range Status   Specimen Description BLOOD LEFT ANTECUBITAL   Final   Special Requests BOTTLES DRAWN AEROBIC AND ANAEROBIC 5ML   Final   Culture  Setup Time     Final   Value: 04/14/2014 11:12     Performed at [facility]   Culture     Final   Value: PROTEUS MIRABILIS     ESCHERICHIA COLI     Note: Gram Stain Report Called to,Read Back By and Verified With: [name] ON 04/14/2014 AT 11:23P BY [name]     Performed at [facility]   Report Status 04/17/2014 FINAL   Final   Organism ID, Bacteria PROTEUS MIRABILIS   Final   Organism ID, Bacteria ESCHERICHIA COLI   Final  URINE CULTURE     Status: None   Collection Time    04/14/14  7:59 AM      Result Value Ref Range Status   Specimen Description IN/OUT CATH URINE   Final    Special Requests NONE   Final   Culture  Setup Time     Final   Value: 04/14/2014 11:25     Performed at [place]     Final   Value: >=100,000 COLONIES/ML     Performed at [facility]   Culture     Final   Value: ESCHERICHIA COLI     PROTEUS MIRABILIS     Performed at [facility]   Report Status PENDING   Incomplete   Organism ID, Bacteria ESCHERICHIA COLI   Final  CULTURE, BLOOD (ROUTINE X 2)     Status: None   Collection Time  04/16/14  2:00 PM      Result Value Ref Range Status   Specimen Description BLOOD LEFT ARM   Final   Special Requests BOTTLES DRAWN AEROBIC AND ANAEROBIC  10CC   Final   Culture  Setup Time     Final   Value: 04/16/2014 17:15     Performed at [facility]   Culture     Final   Value:        BLOOD CULTURE RECEIVED NO GROWTH TO DATE CULTURE WILL BE HELD FOR 5 DAYS BEFORE ISSUING A FINAL NEGATIVE REPORT     Performed at [facility]   Report Status PENDING   Incomplete  CULTURE, BLOOD (ROUTINE X 2)     Status: None   Collection Time    04/16/14  2:12 PM      Result Value Ref Range Status   Specimen Description BLOOD LEFT HAND   Final   Special Requests BOTTLES DRAWN AEROBIC ONLY  5CC    Final   Culture  Setup Time     Final   Value: 04/16/2014 17:16     Performed at [facility]   Culture     Final   Value:        BLOOD CULTURE RECEIVED NO GROWTH TO DATE CULTURE WILL BE HELD FOR 5 DAYS BEFORE ISSUING A FINAL NEGATIVE REPORT     Performed at [facility]   Report Status PENDING   Incomplete     Studies: Dg Chest 2 View  04/16/2014   CLINICAL DATA:  63 year old male with shortness of breath.  EXAM: CHEST  2 VIEW  COMPARISON:  04/14/2014 and prior chest radiographs dating back to 02/05/2007  FINDINGS: New bilateral central airspace opacities identified-likely moderate pulmonary edema.  Tiny bilateral pleural effusions are noted.  There is no evidence of pneumothorax or acute  bony abnormality.  Upper limits normal heart size again noted.  IMPRESSION: New bilateral central airspace opacities likely representing moderate pulmonary edema. Tiny bilateral pleural effusions.   Electronically Signed   By: Hassan Rowan M.D.   On: 04/16/2014 08:43    Scheduled Meds: . amLODipine  5 mg Oral Daily  . budesonide (PULMICORT) nebulizer solution  0.25 mg Nebulization BID  . cefTRIAXone (ROCEPHIN)  IV  2 g Intravenous Q24H  . folic acid  1 mg Oral Daily  . furosemide  80 mg Intravenous 3 times per day  . isosorbide-hydrALAZINE  1 tablet Oral BID  . LORazepam  0-4 mg Intravenous Q12H  . multivitamin with minerals  1 tablet Oral Daily  . nicotine  14 mg Transdermal Daily  . nystatin  5 mL Oral QID  . potassium chloride  40 mEq Oral Q4H  . saccharomyces boulardii  250 mg Oral BID  . tamsulosin  0.4 mg Oral QPC supper  . thiamine  100 mg Oral Daily   Or  . thiamine  100 mg Intravenous Daily   Continuous Infusions:    Principal Problem:   Sepsis due to urinary tract infection Active Problems:   HTN (hypertension)   Personal history of prostate cancer s/p prostatectomy 2012   Hyponatremia   Alcohol abuse   Tobacco abuse   Colovesical fistula   Sepsis   Urinary tract infection   Elevated bilirubin    Time spent: > 30 minutes    Barton Dubois  [facility] Pager [phone]. If 7PM-7AM, please contact night-coverage at [email or web address], password [email or web address] 04/17/2014, 2:59 PM  LOS: 3  days

## 2014-04-17 NOTE — Progress Notes (Signed)
[facility] Progress Note   Subjective: Doing better today, breathing better, not back to normal yet. 2.7L UOP  Filed Vitals:   04/16/14 1711 04/16/14 2036 04/16/14 2134 04/17/14 0510  BP:  150/78  116/88  Pulse:  96  112  Temp:  98.7 F (37.1 C)  99.2 F (37.3 C)  TempSrc:  Oral  Oral  Resp:  20  20  Height:      Weight:    78.835 kg (173 lb 12.8 oz)  SpO2: 94% 96% 95% 94%   Exam: Awake, alert, less coughing, not coughing today +JVD  Chest clear bilat , no wheezes or rales RRR no MRG  Abd protuberant, nontender, liver down 5 cm  1+ bilat pretib edema  Neuro is alert and Ox 3   Inpatient meds > norvasc, atenolol, Rocephin , Cardura, folic acid, lasix, dilaudid, Avapro (stopped 8/27), Bidil, ativan, morphine, MVI, nicotine, zofran, Zosyn (dc'd 8/28), thiamine, duoneb, ambien  UA >300 protein, 11-20 wbc, 3-6 rbc, many bacteria, +nit/LE, cloudy  Urine osm - 214 mOsm/kg  CXR 8/26 vasc congestion  CXR 8/28 > new bilat pulm edema Renal US 8/26 >> 12.5 and 13.6 cm, no hydro, nl echo  Assessment:  1 Hyponatremia , hypervolemic - improving with diuresis. Low urine osm. Continue diuresis as needed for #2 2 Pulm edema / acute resp failure - looks much better, diuresing.  EF on echo was normal, poss diast HF vs just vol overload related to AKI and fluid administration 2 Acute renal failure - creat down slightly, was 1.17 on admission 8.26 3 ETOH abuse and withdrawal 4 Colovesicular fistula / UTI / Proteus bacteremia - on IV zosyn 5 HTN - longstanding, hard to control, was on 4 BP meds + lasix at home (norvasc, cardura, atenolol, Benicar). Stop cardura, cont Bidil and norvasc for now.  Plan- stop Cardura , continue lasix same dose, f/u CXR tomorrow. Check urine protein:creat ratio    Kelly Splinter MD  pager [phone]    cell [phone]  04/17/2014, 6:34 AM     Recent Labs Lab 04/15/14 0526 04/16/14 0450 04/17/14 0404  NA 126* 119* 127*  K 5.1 4.8 3.6*  CL  92* 88* 90*  CO2 19 18* 18*  GLUCOSE 173* 123* 131*  BUN 24* 35* 42*  CREATININE 1.95* 2.08* 1.90*  CALCIUM 7.4* 7.5* 7.9*    Recent Labs Lab 04/14/14 0655 04/15/14 0526  AST 14 11  ALT 8 6  ALKPHOS 58 45  BILITOT 1.7* 1.4*  PROT 6.7 6.1  ALBUMIN 3.4* 2.7*    Recent Labs Lab 04/14/14 0655 04/15/14 0526 04/16/14 0450  WBC 17.5* 22.0* 17.9*  NEUTROABS 16.3*  --   --   HGB 16.2 14.6 13.7  HCT 45.5 41.1 37.7*  MCV 94.8 98.6 96.4  PLT 167 138* 119*   . amLODipine  5 mg Oral Daily  . budesonide (PULMICORT) nebulizer solution  0.25 mg Nebulization BID  . cefTRIAXone (ROCEPHIN)  IV  2 g Intravenous Q24H  . doxazosin  4 mg Oral Daily  . folic acid  1 mg Oral Daily  . furosemide  80 mg Intravenous Q6H  . isosorbide-hydrALAZINE  1 tablet Oral BID  . LORazepam  0-4 mg Intravenous Q6H   Followed by  . LORazepam  0-4 mg Intravenous Q12H  . multivitamin with minerals  1 tablet Oral Daily  . nicotine  14 mg Transdermal Daily  . thiamine  100 mg Oral Daily   Or  . thiamine  100 mg Intravenous Daily     HYDROmorphone (DILAUDID) injection, ipratropium-albuterol, LORazepam, LORazepam, ondansetron (ZOFRAN) IV, zolpidem

## 2014-04-18 ENCOUNTER — Inpatient Hospital Stay (HOSPITAL_COMMUNITY): Payer: BC Managed Care – PPO

## 2014-04-18 DIAGNOSIS — E876 Hypokalemia: Secondary | ICD-10-CM

## 2014-04-18 LAB — CBC
HEMATOCRIT: 38 % — AB (ref 39.0–52.0)
HEMOGLOBIN: 13.8 g/dL (ref 13.0–17.0)
MCH: 34.1 pg — ABNORMAL HIGH (ref 26.0–34.0)
MCHC: 36.3 g/dL — ABNORMAL HIGH (ref 30.0–36.0)
MCV: 93.8 fL (ref 78.0–100.0)
Platelets: 168 10*3/uL (ref 150–400)
RBC: 4.05 MIL/uL — AB (ref 4.22–5.81)
RDW: 12 % (ref 11.5–15.5)
WBC: 9.7 10*3/uL (ref 4.0–10.5)

## 2014-04-18 LAB — BASIC METABOLIC PANEL
Anion gap: 14 (ref 5–15)
BUN: 35 mg/dL — AB (ref 6–23)
CHLORIDE: 95 meq/L — AB (ref 96–112)
CO2: 22 mEq/L (ref 19–32)
Calcium: 8.5 mg/dL (ref 8.4–10.5)
Creatinine, Ser: 1.45 mg/dL — ABNORMAL HIGH (ref 0.50–1.35)
GFR calc non Af Amer: 50 mL/min — ABNORMAL LOW (ref >90)
GFR, EST AFRICAN AMERICAN: 58 mL/min — AB (ref >90)
Glucose, Bld: 167 mg/dL — ABNORMAL HIGH (ref 70–99)
POTASSIUM: 3.9 meq/L (ref 3.7–5.3)
Sodium: 131 mEq/L — ABNORMAL LOW (ref 137–147)

## 2014-04-18 LAB — URINE CULTURE: Colony Count: >=100000

## 2014-04-18 MED ORDER — DILTIAZEM HCL 100 MG IV SOLR
5.0000 mg/h | INTRAVENOUS | Status: DC
  Administered 2014-04-19: 5 mg/h via INTRAVENOUS
  Filled 2014-04-18: qty 100

## 2014-04-18 MED ORDER — DILTIAZEM LOAD VIA INFUSION
10.0000 mg | Freq: Once | INTRAVENOUS | Status: AC
  Administered 2014-04-19: 10 mg via INTRAVENOUS
  Filled 2014-04-18: qty 10

## 2014-04-18 MED ORDER — FUROSEMIDE 20 MG PO TABS: 20.0000 mg | ORAL_TABLET | Freq: Every day | ORAL | Status: DC

## 2014-04-18 MED ORDER — ENSURE COMPLETE PO LIQD
237.0000 mL | Freq: Three times a day (TID) | ORAL | Status: DC
  Administered 2014-04-18 – 2014-04-21 (×7): 237 mL via ORAL

## 2014-04-18 MED ORDER — FUROSEMIDE 40 MG PO TABS
40.0000 mg | ORAL_TABLET | Freq: Two times a day (BID) | ORAL | Status: AC
  Administered 2014-04-18 – 2014-04-19 (×4): 40 mg via ORAL
  Filled 2014-04-18 (×4): qty 1

## 2014-04-18 MED ORDER — SODIUM CHLORIDE 0.9 % IJ SOLN
3.0000 mL | INTRAMUSCULAR | Status: DC | PRN
  Administered 2014-04-18 (×2): 3 mL via INTRAVENOUS

## 2014-04-18 NOTE — Progress Notes (Addendum)
[facility] Progress Note   Subjective: Only 800 cc recorded yest but weight is down 3 more kg so doubt all urine was collected  Filed Vitals:   04/17/14 2051 04/17/14 2127 04/18/14 0600 04/18/14 0602  BP:  151/92  157/99  Pulse:  101  110  Temp:  99.6 F (37.6 C)  98.6 F (37 C)  TempSrc:  Oral  Oral  Resp:  20  20  Height:      Weight:   75.751 kg (167 lb)   SpO2: 96% 93%  97%   Exam: Awake, more alert, not coughing No jvd Chest clear bilat , no wheezes or rales RRR no MRG  Abd protuberant, nontender Edema resolved LE's Neuro is alert ,nonfocal  Inpatient meds > norvasc, atenolol, Rocephin , Cardura, folic acid, lasix, dilaudid, Avapro (stopped 8/27), Bidil, ativan, morphine, MVI, nicotine, zofran, Zosyn (dc'd 8/28), thiamine, duoneb, ambien  UA >300 protein, 11-20 wbc, 3-6 rbc, many bacteria, +nit/LE, cloudy  Urine osm - 214 mOsm/kg  CXR 8/26 vasc congestion  CXR 8/28 > new bilat pulm edema Renal US 8/26 >> 12.5 and 13.6 cm, no hydro, nl echo  Assessment:  1 Hyponatremia , hypervolemic - resolved with diuresis 2 Pulm edema / acute resp failure - resolved clinically, echo w normal EF , mild LVH, suspect some aspect of diast HF exists to cause these problems (low Na, AKI) 2 Acute renal failure - resolving, Avapro cont to hold 3 ETOH abuse and withdrawal, improving  4 Colovesicular fistula / UTI / Proteus bacteremia - on IV zosyn 5 HTN - longstanding, on 4 BP meds and po lasix at home.   Plan- dc IV lasix, give po lasix 40 bid x 2 d then resume home dose at 20 mg daily. Will sign off. Resume other home BP meds as needed for BP control.     Kelly Splinter MD  pager [phone]    cell [phone]  04/18/2014, 7:50 AM     Recent Labs Lab 04/16/14 0450 04/17/14 0404 04/18/14 0507  NA 119* 127* 131*  K 4.8 3.6* 3.9  CL 88* 90* 95*  CO2 18* 18* 22  GLUCOSE 123* 131* 167*  BUN 35* 42* 35*  CREATININE 2.08* 1.90* 1.45*  CALCIUM 7.5* 7.9* 8.5     Recent Labs Lab 04/14/14 0655 04/15/14 0526  AST 14 11  ALT 8 6  ALKPHOS 58 45  BILITOT 1.7* 1.4*  PROT 6.7 6.1  ALBUMIN 3.4* 2.7*    Recent Labs Lab 04/14/14 0655 04/15/14 0526 04/16/14 0450 04/18/14 0507  WBC 17.5* 22.0* 17.9* 9.7  NEUTROABS 16.3*  --   --   --   HGB 16.2 14.6 13.7 13.8  HCT 45.5 41.1 37.7* 38.0*  MCV 94.8 98.6 96.4 93.8  PLT 167 138* 119* 168   . amLODipine  5 mg Oral Daily  . budesonide (PULMICORT) nebulizer solution  0.25 mg Nebulization BID  . cefTRIAXone (ROCEPHIN)  IV  2 g Intravenous Q24H  . folic acid  1 mg Oral Daily  . furosemide  80 mg Intravenous 3 times per day  . isosorbide-hydrALAZINE  1 tablet Oral BID  . LORazepam  0-4 mg Intravenous Q12H  . multivitamin with minerals  1 tablet Oral Daily  . nicotine  14 mg Transdermal Daily  . nystatin  5 mL Oral QID  . saccharomyces boulardii  250 mg Oral BID  . sodium chloride  3 mL Intravenous Q12H  . tamsulosin  0.4 mg Oral QPC  supper  . thiamine  100 mg Oral Daily   Or  . thiamine  100 mg Intravenous Daily     HYDROmorphone (DILAUDID) injection, ipratropium-albuterol, LORazepam, LORazepam, ondansetron (ZOFRAN) IV, sodium chloride, zolpidem

## 2014-04-18 NOTE — Progress Notes (Signed)
[facility] PROGRESS NOTE  Douglas Ballard [id number] DOB: 21-Oct-1950 DOA: 04/14/2014 PCP: Stephens Shire, MD  Assessment/Plan: 1-sepsis due to UTI and ? bacteremia: E. 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  2-acute renal failure: secondary to UTI, NSAID's and continue use of avapro -IVF's changed to [facility] due to vascular congestion, SOB and mild pulmonary edema -avapro continue to be on hold -follow renal function trend; continue improving creatinine 1.4 (8/30) -will follow nephrology rec's -continue treatment of UTI  3-bacteremia due to proteus mirabilis: most likely translocation from colo-vesicula fistula and UTI -pan-sensitivity  -continue rocephin x1 more day as recommended by ID; then we'll treat with Levaquin 500 mg daily for about 9 days as an outpatient. -base on clinical response might required repeat CT scan of abdomen with oral contrast   4-hyponatremia: secondary to alcohol abuse and dehydration initially; then secondary to hypervolemic state  -mild improved with IVF's initially; with subsequent worsening renal function and development of vascular congestion/mild pulmonary edema -Continue restricting IVF's to 1.5-2 L daily -will continue lasix (will be adjusted to by mouth and just 40 mg twice a day); if patient continue improving clinically we'll resume daily 20 mg of Lasix on 8/31  5-HTN: will continue norvasc and bidil for now; hold avapro due to ARF -lasix also helping with BP  6-alcohol abuse with delirium/withdrawal: continue CIWA, thiamine and folic acid -cessation counseling provided -Mentation is now back to baseline.  7-tobacco abuse: will start nicotine patch -cessation counseling provided  8-leukocytosis: due to #1 -continue antibiotics -follow WBC's trend; back to within normal limits  now -patient is afebrile  9-acute resp failure with hypoxia: due to vascular congestion and pulmonary edema -2-D echo with preserved EF and no WMA. Presumed diastolic HF given uncontrolled hypertension and alcohol abuse. There is LVH appreciated on echo. -as needed supplemental oxygen -PRN nebulizer treatment -continue lasix, will transition to by mouth 40 mg twice a day times one more day and after that, will resume 20 mg daily -Low-sodium diet has been discussed with the patient along with fluid restriction (1.5-2 L in 24 hours).  10-BPH: will continue flomax  11-oral candidiasis: will continue nystatin  12-functional diarrhea: due to use of antibiotics -will start florastor -if continue or worsen will check for c. Diff  Code Status: Full Family Communication: wife at bedside  Disposition Plan: to be determine   Consultants:  [facility]  ID (Dr. Tommy Medal curbside for antibiotics choice and length of therapy)  Procedures:  See below for x-ray reports   Antibiotics:  zosyn 8/26-8/28  Rocephin 8/28  HPI/Subjective:  Feeling better and breathing easier. AAOX3. No fever, no nausea, no vomiting and no abd pain.  Objective: Filed Vitals:   04/18/14 1440  BP: 161/92  Pulse: 119  Temp: 98.9 F (37.2 C)  Resp: 18    Intake/Output Summary (Last 24 hours) at 04/18/14 1457 Last data filed at 04/18/14 1300  Gross per 24 hour  Intake   1873 ml  Output   1075 ml  Net    798 ml   Filed Weights   04/14/14 1018 04/17/14 0510 04/18/14 0600  Weight: 80.2 kg (176 lb 12.9 oz) 78.835 kg (173 lb 12.8 oz) 75.751 kg (167 lb)    Exam:   General:  Feeling better and breathing easier. AAOX3. good insight of his problems. Afebrile, no nausea, no vomiting and no abd pain. Tolerating soft  diet.  Cardiovascular: tachycardic, S1 and S2, no rubs or gallops  Respiratory: no wheezing; no crackles; improved air movement and good oxygen saturation on room air (97%)  Abdomen: no distension,  positive BS, no guarding  Musculoskeletal: Trace edema, no cyanosis or clubbing  Data Reviewed: Basic Metabolic Panel:  Recent Labs Lab 04/14/14 0655 04/15/14 0526 04/16/14 0450 04/17/14 0404 04/18/14 0507  NA 124* 126* 119* 127* 131*  K 4.3 5.1 4.8 3.6* 3.9  CL 88* 92* 88* 90* 95*  CO2 21 19 18* 18* 22  GLUCOSE 233* 173* 123* 131* 167*  BUN 15 24* 35* 42* 35*  CREATININE 1.17 1.95* 2.08* 1.90* 1.45*  CALCIUM 8.2* 7.4* 7.5* 7.9* 8.5   Liver Function Tests:  Recent Labs Lab 04/14/14 0655 04/15/14 0526  AST 14 11  ALT 8 6  ALKPHOS 58 45  BILITOT 1.7* 1.4*  PROT 6.7 6.1  ALBUMIN 3.4* 2.7*    Recent Labs Lab 04/14/14 0655  LIPASE 20   CBC:  Recent Labs Lab 04/14/14 0655 04/15/14 0526 04/16/14 0450 04/18/14 0507  WBC 17.5* 22.0* 17.9* 9.7  NEUTROABS 16.3*  --   --   --   HGB 16.2 14.6 13.7 13.8  HCT 45.5 41.1 37.7* 38.0*  MCV 94.8 98.6 96.4 93.8  PLT 167 138* 119* 168     Recent Results (from the past 240 hour(s))  CULTURE, BLOOD (ROUTINE X 2)     Status: None   Collection Time    04/14/14  6:59 AM      Result Value Ref Range Status   Specimen Description BLOOD RIGHT FOREARM   Final   Special Requests BOTTLES DRAWN AEROBIC AND ANAEROBIC 5ML   Final   Culture  Setup Time     Final   Value: 04/14/2014 11:11     Performed at [facility]   Culture     Final   Value:        BLOOD CULTURE RECEIVED NO GROWTH TO DATE CULTURE WILL BE HELD FOR 5 DAYS BEFORE ISSUING A FINAL NEGATIVE REPORT     Performed at [facility]   Report Status PENDING   Incomplete  CULTURE, BLOOD (ROUTINE X 2)     Status: None   Collection Time    04/14/14  7:36 AM      Result Value Ref Range Status   Specimen Description BLOOD LEFT ANTECUBITAL   Final   Special Requests BOTTLES DRAWN AEROBIC AND ANAEROBIC 5ML   Final   Culture  Setup Time     Final   Value: 04/14/2014 11:12     Performed at [facility]   Culture     Final   Value: PROTEUS MIRABILIS      ESCHERICHIA COLI     Note: Gram Stain Report Called to,Read Back By and Verified With: [name] ON 04/14/2014 AT 11:23P BY [name]     Performed at [facility]   Report Status 04/17/2014 FINAL   Final   Organism ID, Bacteria PROTEUS MIRABILIS   Final   Organism ID, Bacteria ESCHERICHIA COLI   Final  URINE CULTURE     Status: None   Collection Time    04/14/14  7:59 AM      Result Value Ref Range Status   Specimen Description IN/OUT CATH URINE   Final   Special Requests NONE   Final   Culture  Setup Time     Final   Value: 04/14/2014 11:25  Performed at [facility]     Final   Value: >=100,000 COLONIES/ML     Performed at [facility]   Culture     Final   Value: [place]     Performed at [facility]   Report Status 04/18/2014 FINAL   Final   Organism ID, Bacteria ESCHERICHIA COLI   Final   Organism ID, Bacteria PROTEUS MIRABILIS   Final  CULTURE, BLOOD (ROUTINE X 2)     Status: None   Collection Time    04/16/14  2:00 PM      Result Value Ref Range Status   Specimen Description BLOOD LEFT ARM   Final   Special Requests BOTTLES DRAWN AEROBIC AND ANAEROBIC  10CC   Final   Culture  Setup Time     Final   Value: 04/16/2014 17:15     Performed at [facility]   Culture     Final   Value:        BLOOD CULTURE RECEIVED NO GROWTH TO DATE CULTURE WILL BE HELD FOR 5 DAYS BEFORE ISSUING A FINAL NEGATIVE REPORT     Performed at [facility]   Report Status PENDING   Incomplete  CULTURE, BLOOD (ROUTINE X 2)     Status: None   Collection Time    04/16/14  2:12 PM      Result Value Ref Range Status   Specimen Description BLOOD LEFT HAND   Final   Special Requests BOTTLES DRAWN AEROBIC ONLY  5CC    Final   Culture  Setup Time     Final   Value: 04/16/2014 17:16     Performed at [facility]   Culture     Final   Value:        BLOOD CULTURE RECEIVED NO GROWTH TO DATE  CULTURE WILL BE HELD FOR 5 DAYS BEFORE ISSUING A FINAL NEGATIVE REPORT     Performed at [facility]   Report Status PENDING   Incomplete     Studies: Dg Chest 2 View  04/18/2014   CLINICAL DATA:  63 year old male with pulmonary edema. Initial encounter.  EXAM: CHEST  2 VIEW  COMPARISON:  04/16/2014 and earlier.  FINDINGS: Interval regressed asymmetric bilateral peribronchovascular opacity. The findings are not completely resolved. Small bilateral pleural effusions. Stable cardiac size and mediastinal contours. Visualized tracheal air column is within normal limits. No pneumothorax. No areas of worsening ventilation. Chronic upper thoracic compression fracture. Stable visualized osseous structures.  IMPRESSION: Regressed bilateral pulmonary opacity compatible with decreasing asymmetric pulmonary edema. Small bilateral effusions.   Electronically Signed   By: Lars Pinks M.D.   On: 04/18/2014 08:20    Scheduled Meds: . amLODipine  5 mg Oral Daily  . budesonide (PULMICORT) nebulizer solution  0.25 mg Nebulization BID  . cefTRIAXone (ROCEPHIN)  IV  2 g Intravenous Q24H  . folic acid  1 mg Oral Daily  . [START ON 04/21/2014] furosemide  20 mg Oral Daily  . furosemide  40 mg Oral BID  . isosorbide-hydrALAZINE  1 tablet Oral BID  . LORazepam  0-4 mg Intravenous Q12H  . multivitamin with minerals  1 tablet Oral Daily  . nicotine  14 mg Transdermal Daily  . nystatin  5 mL Oral QID  . saccharomyces boulardii  250 mg Oral BID  . sodium chloride  3 mL Intravenous Q12H  . tamsulosin  0.4 mg Oral QPC supper  . thiamine  100 mg Oral Daily   Or  . thiamine  100 mg Intravenous Daily   Continuous Infusions:    Principal Problem:   Sepsis due to urinary tract infection Active Problems:   HTN (hypertension)   Personal history of prostate cancer s/p prostatectomy 2012   Hyponatremia   Alcohol abuse   Tobacco abuse   Colovesical fistula   Sepsis   Urinary tract infection   Elevated  bilirubin    Time spent: < 30 minutes    Barton Dubois  [facility] Pager [phone]. If 7PM-7AM, please contact night-coverage at [email or web address], password [facility] 04/18/2014, 2:57 PM  LOS: 4 days

## 2014-04-18 NOTE — Progress Notes (Addendum)
INITIAL NUTRITION ASSESSMENT  DOCUMENTATION CODES Per approved criteria  -Not Applicable   INTERVENTION: Continue Soft Diet Ensure Complete po TID, each supplement provides 350 kcal and 13 grams of protein Answered patient's questions regarding TPN (wife's niece is a RD), best to increase oral intake to improve nutrition unless n/a with fistula. RD to continue to follow.  NUTRITION DIAGNOSIS: Inadequate oral intake related to medical status as evidenced by report. -progressing  Goal: Intake of meals and supplements to meet >90% estimated needs.-progressing  Monitor:  Plan of care, labs, diet advancement, intake.  Reason for Assessment:   Consult from RN per wife's request for meal planning to boost calorie and nutritional intake.  63 y.o. male  Admitting Dx: Sepsis due to urinary tract infection  ASSESSMENT: Patient admitted with Urosepsis and ARF.  Newly diagnosed colovesicular fistula. Sodium 119 secondary to ETOH abuse and dehydration per MD.  Plan is to allow patient to recover from urosepsis and stabilize medical issues then plan for colonoscopy and surgery the following day with p to avoid temporary colostomy.  Currently with pulmonary edema, possible CHF vs vol overload related to CKD and needs aggressive diuresis.  8/28: -Patient is on a full liquid diet with very poor intake. -Strict fluid restriction needed. -Patient reports eating 2-2 1/2 meals daily until 1-2 days prior to admit.  Wife had been giving him Ensure. -Wife's niece is a RD in [place]. -Weight has been stable per patient.  8/30: -Soft diet, tolerating well with 50-100% intake, -Ready for Ensure -Oral carditis but with no pain with eating -diarrhea probably secondary to antibiotics, florastor added   Height: Ht Readings from Last 1 Encounters:  04/14/14 5\' 9"  (1.753 m)    Weight: Wt Readings from Last 1 Encounters:  04/18/14 167 lb (75.751 kg)    Ideal Body Weight: 160 lbs  % Ideal Body  Weight: 111  Wt Readings from Last 10 Encounters:  04/18/14 167 lb (75.751 kg)  03/24/14 177 lb (80.287 kg)  07/02/11 167 lb 8.8 oz (76 kg)    Usual Body Weight: 177 3 weeks ago  % Usual Body Weight: 100  BMI:  Body mass index is 24.65 kg/(m^2).  Estimated Nutritional Needs: Kcal: 2000-2100 Protein: 90-100 gm  Fluid: strict fluid restriction  Skin: intact  Diet Order: Criss Rosales  EDUCATION NEEDS: -Education needs addressed   Intake/Output Summary (Last 24 hours) at 04/18/14 2025 Last data filed at 04/18/14 1856  Gross per 24 hour  Intake   1333 ml  Output   1075 ml  Net    258 ml    Last BM: unknown  Labs:   Recent Labs Lab 04/16/14 0450 04/17/14 0404 04/18/14 0507  NA 119* 127* 131*  K 4.8 3.6* 3.9  CL 88* 90* 95*  CO2 18* 18* 22  BUN 35* 42* 35*  CREATININE 2.08* 1.90* 1.45*  CALCIUM 7.5* 7.9* 8.5  GLUCOSE 123* 131* 167*    CBG (last 3)  No results found for this basename: GLUCAP,  in the last 72 hours  Scheduled Meds: . amLODipine  5 mg Oral Daily  . budesonide (PULMICORT) nebulizer solution  0.25 mg Nebulization BID  . cefTRIAXone (ROCEPHIN)  IV  2 g Intravenous Q24H  . feeding supplement (ENSURE COMPLETE)  237 mL Oral TID BM  . folic acid  1 mg Oral Daily  . [START ON 04/21/2014] furosemide  20 mg Oral Daily  . furosemide  40 mg Oral BID  . isosorbide-hydrALAZINE  1 tablet Oral  BID  . LORazepam  0-4 mg Intravenous Q12H  . multivitamin with minerals  1 tablet Oral Daily  . nicotine  14 mg Transdermal Daily  . nystatin  5 mL Oral QID  . saccharomyces boulardii  250 mg Oral BID  . sodium chloride  3 mL Intravenous Q12H  . tamsulosin  0.4 mg Oral QPC supper  . thiamine  100 mg Oral Daily   Or  . thiamine  100 mg Intravenous Daily    Continuous Infusions:    Past Medical History  Diagnosis Date  . Hypertension   . Cancer     Prostate surgery  . Headache(784.0)   . Pneumonia   . Diabetes mellitus   . 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   Marland Kitchen PNA (pneumonia) 06/26/2011  . Compression fracture of thoracic vertebra 06/26/2011  . Fistula, bladder     Past Surgical History  Procedure Laterality Date  . Robot assisted laparoscopic radical prostatectomy  01/31/2011    Robotic-assisted laparoscopic radical retropubic  . Lipoma excision  2012    Dr Nonah Mattes    Antonieta Iba, RD, LDN Clinical Inpatient Dietitian Pager:  [phone] Weekend and after hours pager:  [phone]

## 2014-04-19 DIAGNOSIS — I4891 Unspecified atrial fibrillation: Secondary | ICD-10-CM

## 2014-04-19 MED ORDER — LORAZEPAM 2 MG/ML IJ SOLN
0.5000 mg | Freq: Three times a day (TID) | INTRAMUSCULAR | Status: DC | PRN
  Administered 2014-04-20 – 2014-04-22 (×2): 1 mg via INTRAVENOUS
  Filled 2014-04-19 (×2): qty 1

## 2014-04-19 MED ORDER — FUROSEMIDE 20 MG PO TABS
20.0000 mg | ORAL_TABLET | Freq: Every day | ORAL | Status: DC
  Administered 2014-04-20 – 2014-04-22 (×3): 20 mg via ORAL
  Filled 2014-04-19 (×3): qty 1

## 2014-04-19 MED ORDER — LEVOFLOXACIN 750 MG PO TABS
750.0000 mg | ORAL_TABLET | ORAL | Status: DC
  Administered 2014-04-19 – 2014-04-22 (×4): 750 mg via ORAL
  Filled 2014-04-19 (×5): qty 1

## 2014-04-19 MED ORDER — DILTIAZEM HCL ER COATED BEADS 180 MG PO CP24
180.0000 mg | ORAL_CAPSULE | Freq: Every day | ORAL | Status: DC
  Filled 2014-04-19: qty 1

## 2014-04-19 MED ORDER — DILTIAZEM HCL ER COATED BEADS 180 MG PO CP24
180.0000 mg | ORAL_CAPSULE | Freq: Every day | ORAL | Status: DC
  Administered 2014-04-19 – 2014-04-20 (×2): 180 mg via ORAL
  Filled 2014-04-19: qty 1

## 2014-04-19 MED ORDER — METOPROLOL TARTRATE 12.5 MG HALF TABLET
12.5000 mg | ORAL_TABLET | Freq: Two times a day (BID) | ORAL | Status: DC
  Administered 2014-04-19 – 2014-04-20 (×2): 12.5 mg via ORAL
  Filled 2014-04-19 (×3): qty 1

## 2014-04-19 NOTE — Progress Notes (Signed)
ANTIBIOTIC CONSULT NOTE   Pharmacy Consult for Levaquin Indication: UTI/bacteremia  Allergies  Allergen Reactions  . Nsaids     Kidney disease/failure  . Sulfa Antibiotics Other (See Comments)    headaches    Patient Measurements: Height: 5\' 9"  (175.3 cm) Weight: 167 lb 9.6 oz (76.023 kg) IBW/kg (Calculated) : 70.7  Vital Signs: Temp: 98.2 F (36.8 C) (08/31 1430) Temp src: Oral (08/31 1430) BP: 143/92 mmHg (08/31 1430) Pulse Rate: 110 (08/31 1430)  Labs:  Recent Labs  04/17/14 0404 04/17/14 1803 04/18/14 0507  WBC  --   --  9.7  HGB  --   --  13.8  PLT  --   --  168  LABCREA  --  38.65  --   CREATININE 1.90*  --  1.45*   Estimated Creatinine Clearance: 52.8 ml/min (by C-G formula based on Cr of 1.45). No results found for this basename: VANCOTROUGH, VANCOPEAK, VANCORANDOM, GENTTROUGH, GENTPEAK, GENTRANDOM, TOBRATROUGH, TOBRAPEAK, TOBRARND, AMIKACINPEAK, AMIKACINTROU, AMIKACIN,  in the last 72 hours    Assessment: 72 yoM with colovesical fistula, planned for colonoscopy/surgery in several weeks admitted with urosepsis/bacteremia.  Has been treated with IV abx and repeat blood cultures have been negative to date.  MD now wishes to transition to PO Levaquin for 9 more days of treatment.  8/26 >> Rocephin >> 8/27, 8/28 >> 8/31 8/27 >> Zosyn >> 8/28  Tmax: AF WBCs: WNL now Renal: AKI, SCr trending down, CrCl~56 ml/min (CG)  8/26 blood x2: 1/2 Proteus (pan-sens) and Ecoli (pan-sens), 1/2 ngtd 8/26 urine: >100,000 Proteus (pan-sens) and Ecoli (pan-sens) 8/28 blood x2: ngtd  Today is day #6 total antibiotics for urosepsis/bacteremia.  Goal of Therapy:  Doses adjusted per renal function Eradication of infection  Plan:  Levaquin 750 mg PO daily x 9 more days.  Hershal Coria 04/19/2014,5:23 PM

## 2014-04-19 NOTE — Progress Notes (Signed)
[facility]  [address] [place], [place] [address] Phone: [phone] FAX: [phone]    Douglas Ballard [id number] Sep 09, 1950  CARE TEAM:  PCP: Stephens Shire, MD  Outpatient Care Team: Patient Care Team: Stephens Shire, MD as PCP - General (Family Medicine) Bernestine Amass, MD as Consulting Physician (Urology) [name], MD as Consulting Physician (General Surgery) Johnn Hai, MD as Consulting Physician (Orthopedic Surgery)  Inpatient Treatment Team: Treatment Team: Attending Provider: Barton Dubois, MD; Registered Nurse: Roxine Caddy, RN; Rounding Team: Ian Bushman, MD; Consulting Physician: [name], MD; Technician: Audie Box, NT; Technician: Carlisle Beers, NT; Registered Nurse: Norlene Duel, RN; Technician: Elenor Legato, NT; Registered Nurse: Levie Heritage, RN; Registered Nurse: Clenton Pare, RN; Registered Nurse: Roe Rutherford, RN   Subjective:  Events noted Afib - rate controlled w diltiazem gtt started 8 hours ago. Tolerating PO Feels much better  Objective:  Vital signs:  Filed Vitals:   04/18/14 2046 04/18/14 2243 04/19/14 0232 04/19/14 0636  BP: 183/103 168/104 143/84 154/103  Pulse: 64 151 93 64  Temp: 99.3 F (37.4 C)  98.5 F (36.9 C) 98.3 F (36.8 C)  TempSrc: Oral  Oral Oral  Resp: '18  18 18  ' Height:      Weight:    167 lb 9.6 oz (76.023 kg)  SpO2: 97% 96% 96% 98%    Last BM Date: 04/18/14  Intake/Output   Yesterday:  08/30 0701 - 08/31 0700 In: 3790 [P.O.:1520; I.V.:35; IV Piggyback:50] Out: 225 [Urine:225] This shift:     Bowel function:  Flatus: y  BM: y  Drain:   Physical Exam:  General: Pt awake/alert/oriented x4 in no acute distress Eyes: PERRL, normal EOM.  Sclera clear.  No icterus Neuro: CN II-XII intact w/o focal sensory/motor deficits. Lymph: No head/neck/groin lymphadenopathy Psych:  No  delerium/psychosis/paranoia HENT: Normocephalic, Mucus membranes moist.  No thrush Neck: Supple, No tracheal deviation Chest: No chest wall pain w good excursion CV:  Pulses intact.  Regular rhythm MS: Normal AROM mjr joints.  No obvious deformity Abdomen: Soft.  Nondistended.  Nontender.  No evidence of peritonitis.  No incarcerated hernias. Ext:  SCDs BLE.  No mjr edema.  No cyanosis Skin: No petechiae / purpura   Problem List:   Principal Problem:   Sepsis due to urinary tract infection Active Problems:   HTN (hypertension)   Personal history of prostate cancer s/p prostatectomy 2012   Hyponatremia   Alcohol abuse   Tobacco abuse   Colovesical fistula   Sepsis   Urinary tract infection   Elevated bilirubin   Assessment  Douglas Ballard  63 y.o. male       Improving  Plan:  Once he several further weeks out, plan colonoscopy and then surgery the next day. This would give the best chance to tolerate a bowel prep, stress of the operation, and have a chance at primary anastomosis and avoid temporary colostomy. Would like to have Urology place ureteral stents given history of prior prostatectomy.  Possible cardiac clearance if IM agrees - d/w Dr Dyann Kief.  -continue IV ABx -control Afib - to NSR.  Possible cardiac clearance if IM agrees - d/w Dr Dyann Kief.  Only episode & brief. -VTE prophylaxis- SCDs, etc -mobilize as tolerated to help recovery    [name], M.D., F.A.C.S. Gastrointestinal and Minimally Invasive Surgery [facility] [address] [place] [address] [place], [place] [address] (  [phone] Main / Paging   04/19/2014   Results:   Labs: Results for orders placed during the hospital encounter of 04/14/14 (from the past 48 hour(s))  PROTEIN / CREATININE RATIO, URINE     Status: Abnormal   Collection Time    04/17/14  6:03 PM      Result Value Ref Range   Creatinine, Urine 38.65     Total Protein, Urine 43.6     Comment: NO  NORMAL RANGE ESTABLISHED FOR THIS TEST   PROTEIN CREATININE RATIO 1.13 (*) 0.00 - 0.15   Comment: Performed at [facility]  CBC     Status: Abnormal   Collection Time    04/18/14  5:07 AM      Result Value Ref Range   WBC 9.7  4.0 - 10.5 K/uL   RBC 4.05 (*) 4.22 - 5.81 MIL/uL   Hemoglobin 13.8  13.0 - 17.0 g/dL   HCT 38.0 (*) 39.0 - 52.0 %   MCV 93.8  78.0 - 100.0 fL   MCH 34.1 (*) 26.0 - 34.0 pg   MCHC 36.3 (*) 30.0 - 36.0 g/dL   RDW 12.0  11.5 - 15.5 %   Platelets 168  150 - 400 K/uL  BASIC METABOLIC PANEL     Status: Abnormal   Collection Time    04/18/14  5:07 AM      Result Value Ref Range   Sodium 131 (*) 137 - 147 mEq/L   Potassium 3.9  3.7 - 5.3 mEq/L   Chloride 95 (*) 96 - 112 mEq/L   CO2 22  19 - 32 mEq/L   Glucose, Bld 167 (*) 70 - 99 mg/dL   BUN 35 (*) 6 - 23 mg/dL   Creatinine, Ser 1.45 (*) 0.50 - 1.35 mg/dL   Calcium 8.5  8.4 - 10.5 mg/dL   GFR calc non Af Amer 50 (*) >90 mL/min   GFR calc Af Amer 58 (*) >90 mL/min   Comment: (NOTE)     The eGFR has been calculated using the CKD EPI equation.     This calculation has not been validated in all clinical situations.     eGFR's persistently <90 mL/min signify possible Chronic Kidney     Disease.   Anion gap 14  5 - 15   IN/OUT CATH URINE    Special Requests NONE     Culture Setup Time 04/14/2014 11:25 Performed at [facility] >=100,000 COLONIES/ML Performed at [facility] Performed at [facility]    Report Status 04/18/2014 FINAL     Organism ID, Bacteria ESCHERICHIA COLI     Organism ID, Bacteria PROTEUS MIRABILIS     Resulting Agency [facility]    Culture & Susceptibility     Antibiotic  Organism Organism Organism      ESCHERICHIA COLI PROTEUS MIRABILIS     AMPICILLIN  8 SENSITIVE S Final <=2 SENSITIVE S Final      CEFAZOLIN  <=4 SENSITIVE S Final <=4 SENSITIVE S Final      CEFTRIAXONE  <=1 SENSITIVE S  Final <=1 SENSITIVE S Final      CIPROFLOXACIN  <=0.25 SENSITIVE S Final <=0.25 SENSITIVE S Final      GENTAMICIN  <=1 SENSITIVE S Final <=1 SENSITIVE S Final      LEVOFLOXACIN  <=0.12 SENSITIVE S Final <=0.12 SENSITIVE S Final      NITROFURANTOIN  <=  16 SENSITIVE S Final 256 RESISTANT R Final      PIP/TAZO  <=4 SENSITIVE S Final <=4 SENSITIVE S Final      TOBRAMYCIN  <=1 SENSITIVE S Final <=1 SENSITIVE S Final      TRIMETH/SULFA  <=20 SENSITIVE S Final <=20 SENSITIVE S Final        Imaging / Studies: Dg Chest 2 View  04/18/2014   CLINICAL DATA:  63 year old male with pulmonary edema. Initial encounter.  EXAM: CHEST  2 VIEW  COMPARISON:  04/16/2014 and earlier.  FINDINGS: Interval regressed asymmetric bilateral peribronchovascular opacity. The findings are not completely resolved. Small bilateral pleural effusions. Stable cardiac size and mediastinal contours. Visualized tracheal air column is within normal limits. No pneumothorax. No areas of worsening ventilation. Chronic upper thoracic compression fracture. Stable visualized osseous structures.  IMPRESSION: Regressed bilateral pulmonary opacity compatible with decreasing asymmetric pulmonary edema. Small bilateral effusions.   Electronically Signed   By: Lars Pinks M.D.   On: 04/18/2014 08:20    Medications / Allergies: per chart  Antibiotics: Anti-infectives   Start     Dose/Rate Route Frequency Ordered Stop   04/16/14 1330  cefTRIAXone (ROCEPHIN) 2 g in dextrose 5 % 50 mL IVPB     2 g 100 mL/hr over 30 Minutes Intravenous Every 24 hours 04/16/14 1324     04/15/14 0800  cefTRIAXone (ROCEPHIN) 2 g in dextrose 5 % 50 mL IVPB  Status:  Discontinued     2 g 100 mL/hr over 30 Minutes Intravenous Every 24 hours 04/14/14 0758 04/15/14 0000   04/15/14 0030  piperacillin-tazobactam (ZOSYN) IVPB 3.375 g  Status:  Discontinued     3.375 g 12.5 mL/hr over 240 Minutes Intravenous Every 8 hours 04/15/14 0013 04/16/14 1323   04/14/14 0730   cefTRIAXone (ROCEPHIN) 2 g in dextrose 5 % 50 mL IVPB     2 g 100 mL/hr over 30 Minutes Intravenous  Once 04/14/14 0724 04/14/14 0900       Note: Portions of this report may have been transcribed using voice recognition software. Every effort was made to ensure accuracy; however, inadvertent computerized transcription errors may be present.   Any transcriptional errors that result from this process are unintentional.

## 2014-04-19 NOTE — Progress Notes (Signed)
Thanks for update

## 2014-04-19 NOTE — Progress Notes (Signed)
Patient's heart rate up to 150's and BP elevated.  Patient in Afib and appears to have been in Afib for a few days.  Notified NP on call of patient's vitals and results of EKG.  Started patient on Cardizem drip at 5 cc/hr with a 10 mg bolus around midnight per orders.  Will continue to monitor patient.

## 2014-04-19 NOTE — Progress Notes (Signed)
[facility] PROGRESS NOTE  Douglas Ballard [id number] DOB: [birth year] DOA: 04/14/2014 PCP: Stephens Shire, MD  Assessment/Plan: 1-sepsis due to UTI and ? bacteremia: E. 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  4-hyponatremia: secondary to alcohol abuse and dehydration initially; then secondary to hypervolemic state  -mild improved with IVF's initially; with subsequent worsening renal function and development of vascular congestion/mild pulmonary edema -Continue restricting IVF's to 1.5-2 L daily -will continue lasix (will be adjusted to by mouth and just 40 mg twice a day); if patient continue improving clinically will resume daily 20 mg of Lasix on 9/1  5-HTN: Continue holding avapro due to ARF -lasix also helping with BP -Do to a cute atrial fibrillation episode Norvasc has been discontinue and antihypertensive drug use is a diltiazem with low-dose metoprolol, will also continue BiDil  6-alcohol abuse with delirium/withdrawal: CIWA protocol completed,  -Continue thiamine and folic acid -cessation counseling provided -Mentation is now back  to baseline. -Will use as needed Ativan for anxiety  7-tobacco abuse: will start nicotine patch -cessation counseling provided  8-leukocytosis: due to #1 -continue antibiotics -follow WBC's trend; back to within normal limits now -patient is afebrile and denies abdominal pain or dysuria  9-acute resp failure with hypoxia: due to vascular congestion and pulmonary edema -2-D echo with preserved EF and no WMA. Presumed diastolic HF given uncontrolled hypertension and alcohol abuse. There is LVH appreciated on echo. -as needed supplemental oxygen -Continue PRN nebulizer treatment -continue lasix, will transition to by mouth 40 mg twice a day times one more day and after that, will resume 20 mg daily -Low-sodium diet has been discussed with the patient along with fluid restriction (1.5-2 L in 24 hours).  10-BPH: will continue flomax  11-oral candidiasis: will continue nystatin -Improving  12-functional diarrhea: due to use of antibiotics -will continue florastor -if continue or worsen will check for c. Diff  13-atrial fibrillation: Appears to be secondary to ongoing infection and withdrawal from alcohol abruptly cessation during hospitalization. -Showed brief episode with conversion back to sinus rhythm after diltiazem drip -Will continue diltiazem long acting and low-dose metoprolol -2-D echo has been done and demonstrated no wall motion abnormalities and has preserved ejection fraction. No further cardiac evaluation required at this moment. Code Status: Full Family Communication: wife at bedside  Disposition Plan: to be determine   Consultants:  [facility]  ID (Dr. Tommy Medal curbside for antibiotics choice and length of therapy)  Procedures:  See below for x-ray reports   Antibiotics:  zosyn 8/26-8/28  Rocephin 8/28  HPI/Subjective:  Feeling better, alert awake and oriented x3 and continue easier breathing process. No fever, no nausea, no vomiting and no abd pain. Overnight  patient developed atrial fibrillation with RVR component and require  Cardizem drip.  Objective: Filed Vitals:   04/19/14 1430  BP: 143/92  Pulse: 110  Temp: 98.2 F (36.8 C)  Resp: 20    Intake/Output Summary (Last 24 hours) at 04/19/14 1715 Last data filed at 04/19/14 1521  Gross per 24 hour  Intake   1448 ml  Output    100 ml  Net   1348 ml   Filed Weights   04/17/14 0510 04/18/14 0600 04/19/14 0636  Weight: 78.835 kg (173 lb 12.8 oz) 75.751 kg (167 lb) 76.023 kg (167 lb 9.6 oz)    Exam:   General:  Feeling better and breathing easier. AAOX3. Afebrile, no nausea, no vomiting and no abd pain. Tolerating soft diet. Patient is developed atrial fibrillation overnight requiring Cardizem drip.  Cardiovascular: tachycardic, S1 and S2, no rubs or gallops  Respiratory: no wheezing; no crackles; improved air movement and good oxygen saturation on room air (97%)  Abdomen: no distension, positive BS, no guarding  Musculoskeletal: Trace edema, no cyanosis or clubbing  Data Reviewed: Basic Metabolic Panel:  Recent Labs Lab 04/14/14 0655 04/15/14 0526 04/16/14 0450 04/17/14 0404 04/18/14 0507  NA 124* 126* 119* 127* 131*  K 4.3 5.1 4.8 3.6* 3.9  CL 88* 92* 88* 90* 95*  CO2 21 19 18* 18* 22  GLUCOSE 233* 173* 123* 131* 167*  BUN 15 24* 35* 42* 35*  CREATININE 1.17 1.95* 2.08* 1.90* 1.45*  CALCIUM 8.2* 7.4* 7.5* 7.9* 8.5   Liver Function Tests:  Recent Labs Lab 04/14/14 0655 04/15/14 0526  AST 14 11  ALT 8 6  ALKPHOS 58 45  BILITOT 1.7* 1.4*  PROT 6.7 6.1  ALBUMIN 3.4* 2.7*    Recent Labs Lab 04/14/14 0655  LIPASE 20   CBC:  Recent Labs Lab 04/14/14 0655 04/15/14 0526 04/16/14 0450 04/18/14 0507  WBC 17.5* 22.0* 17.9* 9.7  NEUTROABS 16.3*  --   --   --   HGB 16.2 14.6 13.7 13.8  HCT 45.5 41.1 37.7* 38.0*  MCV 94.8 98.6 96.4 93.8  PLT 167 138* 119* 168     Recent Results (from the past 240 hour(s))  CULTURE, BLOOD (ROUTINE X 2)     Status:  None   Collection Time    04/14/14  6:59 AM      Result Value Ref Range Status   Specimen Description BLOOD RIGHT FOREARM   Final   Special Requests BOTTLES DRAWN AEROBIC AND ANAEROBIC 5ML   Final   Culture  Setup Time     Final   Value: 04/14/2014 11:11     Performed at [facility]   Culture     Final   Value:        BLOOD CULTURE RECEIVED NO GROWTH TO DATE CULTURE WILL BE HELD FOR 5 DAYS BEFORE ISSUING A FINAL NEGATIVE REPORT     Performed at [facility]   Report Status PENDING   Incomplete  CULTURE, BLOOD (ROUTINE X 2)     Status: None   Collection Time    04/14/14  7:36 AM      Result Value Ref Range Status   Specimen Description BLOOD LEFT ANTECUBITAL   Final   Special Requests BOTTLES DRAWN AEROBIC AND ANAEROBIC 5ML   Final   Culture  Setup Time     Final   Value: 04/14/2014 11:12     Performed at [facility]   Culture     Final   Value: [place]  ESCHERICHIA COLI     Note: Gram Stain Report Called to,Read Back By and Verified With: [name] ON 04/14/2014 AT 11:23P BY [name]     Performed at [facility]   Report Status 04/17/2014 FINAL   Final   Organism ID, Bacteria PROTEUS MIRABILIS   Final   Organism ID, Bacteria ESCHERICHIA COLI   Final  URINE CULTURE     Status: None   Collection Time    04/14/14  7:59 AM      Result Value Ref Range Status   Specimen Description IN/OUT CATH URINE   Final   Special Requests NONE   Final   Culture  Setup Time     Final   Value: 04/14/2014 11:25     Performed at [facility]     Final   Value: >=100,000 COLONIES/ML     Performed at [facility]   Culture     Final   Value: [place]     Performed at [facility]   Report Status 04/18/2014 FINAL   Final   Organism ID, Bacteria ESCHERICHIA COLI   Final   Organism ID, Bacteria PROTEUS MIRABILIS   Final  CULTURE, BLOOD (ROUTINE X 2)     Status: None   Collection  Time    04/16/14  2:00 PM      Result Value Ref Range Status   Specimen Description BLOOD LEFT ARM   Final   Special Requests BOTTLES DRAWN AEROBIC AND ANAEROBIC  10CC   Final   Culture  Setup Time     Final   Value: 04/16/2014 17:15     Performed at [facility]   Culture     Final   Value:        BLOOD CULTURE RECEIVED NO GROWTH TO DATE CULTURE WILL BE HELD FOR 5 DAYS BEFORE ISSUING A FINAL NEGATIVE REPORT     Performed at [facility]   Report Status PENDING   Incomplete  CULTURE, BLOOD (ROUTINE X 2)     Status: None   Collection Time    04/16/14  2:12 PM      Result Value Ref Range Status   Specimen Description BLOOD LEFT HAND   Final   Special Requests BOTTLES DRAWN AEROBIC ONLY  5CC    Final   Culture  Setup Time     Final   Value: 04/16/2014 17:16     Performed at [facility]   Culture     Final   Value:        BLOOD CULTURE RECEIVED NO GROWTH TO DATE CULTURE WILL BE HELD FOR 5 DAYS BEFORE ISSUING A FINAL NEGATIVE REPORT     Performed at [facility]   Report Status PENDING   Incomplete     Studies: Dg Chest 2 View  04/18/2014   CLINICAL DATA:  63 year old male with pulmonary edema. Initial encounter.  EXAM: CHEST  2 VIEW  COMPARISON:  04/16/2014 and earlier.  FINDINGS: Interval regressed asymmetric bilateral peribronchovascular opacity. The findings are not completely resolved. Small bilateral pleural effusions. Stable cardiac size and mediastinal contours. Visualized tracheal air column is within normal limits. No pneumothorax. No areas of worsening ventilation. Chronic upper thoracic compression fracture. Stable visualized osseous structures.  IMPRESSION: Regressed bilateral pulmonary opacity compatible with decreasing asymmetric pulmonary edema. Small bilateral effusions.   Electronically Signed   By: Lezlie Octave.D.  On: 04/18/2014 08:20    Scheduled Meds: . budesonide (PULMICORT) nebulizer solution  0.25 mg Nebulization BID  .  cefTRIAXone (ROCEPHIN)  IV  2 g Intravenous Q24H  . diltiazem  180 mg Oral Daily  . feeding supplement (ENSURE COMPLETE)  237 mL Oral TID BM  . folic acid  1 mg Oral Daily  . [START ON 04/20/2014] furosemide  20 mg Oral Daily  . furosemide  40 mg Oral BID  . isosorbide-hydrALAZINE  1 tablet Oral BID  . metoprolol tartrate  12.5 mg Oral BID  . multivitamin with minerals  1 tablet Oral Daily  . nicotine  14 mg Transdermal Daily  . nystatin  5 mL Oral QID  . saccharomyces boulardii  250 mg Oral BID  . sodium chloride  3 mL Intravenous Q12H  . tamsulosin  0.4 mg Oral QPC supper  . thiamine  100 mg Oral Daily   Continuous Infusions:    Principal Problem:   Sepsis due to urinary tract infection Active Problems:   HTN (hypertension)   Personal history of prostate cancer s/p prostatectomy 2012   Hyponatremia   Alcohol abuse   Tobacco abuse   Colovesical fistula   Sepsis   Urinary tract infection   Elevated bilirubin    Time spent: < 30 minutes    Barton Dubois  [facility] Pager [phone]. If 7PM-7AM, please contact night-coverage at [email or web address], password [facility] 04/19/2014, 5:15 PM  LOS: 5 days

## 2014-04-20 ENCOUNTER — Inpatient Hospital Stay (HOSPITAL_COMMUNITY): Payer: BC Managed Care – PPO

## 2014-04-20 ENCOUNTER — Encounter (HOSPITAL_COMMUNITY): Payer: Self-pay | Admitting: Physician Assistant

## 2014-04-20 DIAGNOSIS — I639 Cerebral infarction, unspecified: Secondary | ICD-10-CM

## 2014-04-20 DIAGNOSIS — G459 Transient cerebral ischemic attack, unspecified: Secondary | ICD-10-CM

## 2014-04-20 HISTORY — DX: Cerebral infarction, unspecified: I63.9

## 2014-04-20 LAB — CBC
HCT: 39.9 % (ref 39.0–52.0)
Hemoglobin: 14.6 g/dL (ref 13.0–17.0)
MCH: 34.8 pg — ABNORMAL HIGH (ref 26.0–34.0)
MCHC: 36.6 g/dL — ABNORMAL HIGH (ref 30.0–36.0)
MCV: 95.2 fL (ref 78.0–100.0)
PLATELETS: 233 10*3/uL (ref 150–400)
RBC: 4.19 MIL/uL — AB (ref 4.22–5.81)
RDW: 12.1 % (ref 11.5–15.5)
WBC: 10.1 10*3/uL (ref 4.0–10.5)

## 2014-04-20 LAB — CULTURE, BLOOD (ROUTINE X 2): Culture: NO GROWTH

## 2014-04-20 LAB — GLUCOSE, CAPILLARY
GLUCOSE-CAPILLARY: 242 mg/dL — AB (ref 70–99)
Glucose-Capillary: 264 mg/dL — ABNORMAL HIGH (ref 70–99)
Glucose-Capillary: 358 mg/dL — ABNORMAL HIGH (ref 70–99)

## 2014-04-20 LAB — BASIC METABOLIC PANEL
ANION GAP: 15 (ref 5–15)
BUN: 28 mg/dL — AB (ref 6–23)
CO2: 26 mEq/L (ref 19–32)
Calcium: 8.8 mg/dL (ref 8.4–10.5)
Chloride: 90 mEq/L — ABNORMAL LOW (ref 96–112)
Creatinine, Ser: 1.15 mg/dL (ref 0.50–1.35)
GFR, EST AFRICAN AMERICAN: 77 mL/min — AB (ref >90)
GFR, EST NON AFRICAN AMERICAN: 66 mL/min — AB (ref >90)
Glucose, Bld: 212 mg/dL — ABNORMAL HIGH (ref 70–99)
Potassium: 3.4 mEq/L — ABNORMAL LOW (ref 3.7–5.3)
SODIUM: 131 meq/L — AB (ref 137–147)

## 2014-04-20 LAB — HEMOGLOBIN A1C
Hgb A1c MFr Bld: 6.6 % — ABNORMAL HIGH (ref <5.7)
Mean Plasma Glucose: 143 mg/dL — ABNORMAL HIGH (ref <117)

## 2014-04-20 LAB — MRSA PCR SCREENING: MRSA by PCR: NEGATIVE

## 2014-04-20 LAB — CLOSTRIDIUM DIFFICILE BY PCR: CDIFFPCR: NEGATIVE

## 2014-04-20 MED ORDER — CLOPIDOGREL BISULFATE 75 MG PO TABS
75.0000 mg | ORAL_TABLET | Freq: Every day | ORAL | Status: DC
  Administered 2014-04-20 – 2014-04-21 (×2): 75 mg via ORAL
  Filled 2014-04-20 (×3): qty 1

## 2014-04-20 MED ORDER — DILTIAZEM HCL ER COATED BEADS 240 MG PO CP24
240.0000 mg | ORAL_CAPSULE | Freq: Every day | ORAL | Status: DC
  Administered 2014-04-21 – 2014-04-23 (×3): 240 mg via ORAL
  Filled 2014-04-20 (×2): qty 1
  Filled 2014-04-20: qty 2

## 2014-04-20 MED ORDER — HEPARIN SODIUM (PORCINE) 5000 UNIT/ML IJ SOLN
5000.0000 [IU] | Freq: Three times a day (TID) | INTRAMUSCULAR | Status: DC
  Administered 2014-04-20 – 2014-04-21 (×3): 5000 [IU] via SUBCUTANEOUS
  Filled 2014-04-20 (×4): qty 1

## 2014-04-20 MED ORDER — LABETALOL HCL 5 MG/ML IV SOLN
0.5000 mg/min | INTRAVENOUS | Status: DC
  Administered 2014-04-20 (×3): 2.5 mg/min via INTRAVENOUS
  Administered 2014-04-20: 1.5 mg/min via INTRAVENOUS
  Administered 2014-04-21 (×2): 2.5 mg/min via INTRAVENOUS
  Filled 2014-04-20 (×7): qty 100

## 2014-04-20 MED ORDER — METOPROLOL TARTRATE 25 MG PO TABS
25.0000 mg | ORAL_TABLET | Freq: Two times a day (BID) | ORAL | Status: DC
Start: 2014-04-20 — End: 2014-04-20
  Filled 2014-04-20: qty 1

## 2014-04-20 MED ORDER — INSULIN ASPART 100 UNIT/ML ~~LOC~~ SOLN
0.0000 [IU] | Freq: Three times a day (TID) | SUBCUTANEOUS | Status: DC
  Administered 2014-04-20: 15 [IU] via SUBCUTANEOUS
  Administered 2014-04-21 (×2): 8 [IU] via SUBCUTANEOUS

## 2014-04-20 MED ORDER — POTASSIUM CHLORIDE CRYS ER 20 MEQ PO TBCR
40.0000 meq | EXTENDED_RELEASE_TABLET | Freq: Every day | ORAL | Status: DC
Start: 2014-04-20 — End: 2014-04-23
  Administered 2014-04-20 – 2014-04-23 (×4): 40 meq via ORAL
  Filled 2014-04-20 (×4): qty 2

## 2014-04-20 NOTE — Progress Notes (Signed)
Saved as wrong format, please ignore, see consult on same day  Hilbert Corrigan PA Pager: [phone]

## 2014-04-20 NOTE — Progress Notes (Signed)
Pt ambulating in hall with wife and stopped at the nurses station. It was reported to RN by pt's wife that he was having a conversation with the unit secretary and then he "stopped talking and stared off into the distance." Pt would not respond verbally and would not follow commands. RR nurse called to the floor to assess pt. Pt was verbally not responsive for about 4-5 minutes and was not able to follow commands. Then all of a sudden, he was back to "normal" as in he was verbally responsive, alert and oriented x4 and able to follow commands, neuro check was within normal limits. Pt reports that he could hear everyone talking to him and that he tried to respond, but that he could not respond. Pt's BP at this time ranged from [id number] systolic/ [id number] diastolic. HR was in the 170-140 range. Pt taken back to the room and an EKG was obtained and confirmed A-fib with RVR. MD came to bedside and gave orders for a STAT head CT and to give scheduled oral medications now. Medications given and pt transported to CT scan. CT scan results were negative and reported to the MD. Pt's BP checked several times and the last one at 1215 was 151/98, HR was 110's. Continued to monitor pt.  Othella Boyer [facility]

## 2014-04-20 NOTE — Consult Note (Signed)
CARDIOLOGY CONSULT NOTE   Patient ID: Douglas Ballard MRN: [id number], DOB/AGE: 63-Apr-1952   Admit date: 04/14/2014 Date of Consult: 04/20/2014   Primary Physician: Stephens Shire, MD Primary Cardiologist: New   Pt. Profile   63 year old Caucasian male with past medical history of hypertension, history of prostate cancer status post prostatectomy, newly diagnosed colovesicular fistula, diabetes, alcohol abuse, and tobacco abuse presented with fever, chill, UTI, pulm congestion and PAF  Problem List  Past Medical History  Diagnosis Date  . Hypertension   . Cancer     Prostate surgery  . Headache(784.0)   . Pneumonia   . Diabetes mellitus   . 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   Marland Kitchen PNA (pneumonia) 06/26/2011  . Compression fracture of thoracic vertebra 06/26/2011  . Fistula, bladder     Past Surgical History  Procedure Laterality Date  . Robot assisted laparoscopic radical prostatectomy  01/31/2011    Robotic-assisted laparoscopic radical retropubic  . Lipoma excision  2012    Dr Nonah Mattes     Allergies  Allergies  Allergen Reactions  . Nsaids     Kidney disease/failure  . Sulfa Antibiotics Other (See Comments)    headaches    HPI   The patient is a 63 year old Caucasian male with past medical history of hypertension, history of prostate cancer status post prostatectomy, newly diagnosed colovesicular fistula, diabetes, alcohol abuse, and tobacco abuse. Patient states he has no prior cardiac issue. He does drink vodka on a daily basis and more on the weekend. He also drinks a lot of coffee as well. He had no prior history of palpitation until recently. He states he was diagnosed with colovesical fistula in early August and plan to have a colonoscopy in September prior to surgical surgical repair. Recently, patient  began to have fever, chill, shortness breath, lower extremity swelling, and 2 episodes of palpations at home prompting the patient to seek medical attention [facility] on 04/14/2014.  On arrival to [facility] ED, patient was noted to have a temperature of 102. Initial blood pressure was 145/74. Pulse 77. O2 saturation 96%. Patient was noted to have leukocytosis, blood culture and urine culture were obtained. He was prophylactically started on an antibiotic. Blood culture later returned negative. His blood pressure increased to 190s prior to arrival to the floor and it was presumably secondary to pain. He was placed on alcohol withdraw prophylaxis. He also was noted to have elevated bilirubin which felt to be secondary to EtOH. Initial chest x-ray was noted to have pulmonary congestion and mild pulmonary interstitial edema.   Surgery has seen the patient who recommended colonoscopy prior to surgery repair after his infectious issue has resolved. Patient was also consider for urological stenting. Nephrology was also consulted for acute renal insufficiency. Echocardiogram was obtained on 04/16/2014 which showed EF 50-55%, regional wall motion abnormalities cannot be excluded, moderately dilated biatrium. Although patient denies any CP.  Based on nursing notes recorded on 04/19/2014, patient's heart rate went up to 150s and it was noted to be in atrial fibrillation. He appeared to have been in atrial fibrillation for a few days, although there were no EKG prior to 8/30. Patient was started on Cardizem drip. He had a brief episode of convertion back to normal sinus rhythm while on diltiazem drip, however he converted back to atrial fibrillation  afterward. Patient had a episode of possible TIA in the morning of 04/20/2014, when he presented with a stare on his face and does not respond to any commands. Rapid response was called. A CT of the head was obtained which did not reveal any significant acute process.  Cardiology was consulted for management of atrial fibrillation. Of note, patient is currently on labetalol drip.    Inpatient Medications  . budesonide (PULMICORT) nebulizer solution  0.25 mg Nebulization BID  . clopidogrel  75 mg Oral Daily  . diltiazem  180 mg Oral Daily  . feeding supplement (ENSURE COMPLETE)  237 mL Oral TID BM  . folic acid  1 mg Oral Daily  . furosemide  20 mg Oral Daily  . heparin subcutaneous  5,000 Units Subcutaneous 3 times per day  . insulin aspart  0-15 Units Subcutaneous TID WC  . isosorbide-hydrALAZINE  1 tablet Oral BID  . levofloxacin  750 mg Oral Q24H  . multivitamin with minerals  1 tablet Oral Daily  . nicotine  14 mg Transdermal Daily  . nystatin  5 mL Oral QID  . potassium chloride  40 mEq Oral Daily  . saccharomyces boulardii  250 mg Oral BID  . sodium chloride  3 mL Intravenous Q12H  . tamsulosin  0.4 mg Oral QPC supper  . thiamine  100 mg Oral Daily    Family History Family History  Problem Relation Age of Onset  . Atrial fibrillation Father     onset 41s. Had pacemaker placed for sinus pause     Social History History   Social History  . Marital Status: Married    Spouse Name: N/A    Number of Children: N/A  . Years of Education: N/A   Occupational History  . Not on file.   Social History Main Topics  . Smoking status: Current Every Day Smoker -- 2.00 packs/day for 45 years    Types: Cigarettes  . Smokeless tobacco: Never Used  . Alcohol Use: 3.5 - 5.0 oz/week    7-10 drink(s) per week     Comment: vodka daily  . Drug Use: No  . Sexual Activity: Not Currently   Other Topics Concern  . Not on file   Social History Narrative  . No narrative on file     Review of Systems  General:  No night sweats or weight changes. +chills, fever  Cardiovascular:  No chest pain, dyspnea on exertion, orthopnea, palpitations, paroxysmal nocturnal dyspnea. +edema and SOB on arrival. 2 episodes of palpitation at home and 1 episodes of  palpitation on arrival Dermatological: No rash, lesions/masses Respiratory: No cough, dyspnea Urologic: No hematuria +dysuria Abdominal:   No nausea, vomiting, bright red blood per rectum, melena, or hematemesis +diarrhea Neurologic:  No visual changes, wkns, changes in mental status. All other systems reviewed and are otherwise negative except as noted above.  Physical Exam  Blood pressure 157/101, pulse 118, temperature 98.4 F (36.9 C), temperature source Oral, resp. rate 16, height 5\' 9"  (1.753 m), weight 171 lb 1.2 oz (77.6 kg), SpO2 96.00%.  General: Pleasant, NAD Psych: Normal affect. Neuro: Alert and oriented X 3. Moves all extremities spontaneously. HEENT: Normal  Neck: Supple without bruits JVP 9 Lungs:  Resp regular and unlabored, decreased breath sound, however no obvious rale, rhonchi or wheezing Heart: irregular tachycardic no s3, s4, or murmurs. Abdomen: Soft, non-tender, non-distended, BS + x 4.  Extremities: No clubbing, cyanosis or edema. DP/PT/Radials 2+ and equal bilaterally.  Labs  No results found for this basename: CKTOTAL, CKMB, TROPONINI,  in the last 72 hours Lab Results  Component Value Date   WBC 10.1 04/20/2014   HGB 14.6 04/20/2014   HCT 39.9 04/20/2014   MCV 95.2 04/20/2014   PLT 233 04/20/2014    Recent Labs Lab 04/15/14 0526  04/20/14 0442  NA 126*  < > 131*  K 5.1  < > 3.4*  CL 92*  < > 90*  CO2 19  < > 26  BUN 24*  < > 28*  CREATININE 1.95*  < > 1.15  CALCIUM 7.4*  < > 8.8  PROT 6.1  --   --   BILITOT 1.4*  --   --   ALKPHOS 45  --   --   ALT 6  --   --   AST 11  --   --   GLUCOSE 173*  < > 212*  < > = values in this interval not displayed. No results found for this basename: CHOL,  HDL,  LDLCALC,  TRIG   No results found for this basename: DDIMER    Radiology/Studies  Dg Chest 2 View  04/18/2014   CLINICAL DATA:  63 year old male with pulmonary edema. Initial encounter.  EXAM: CHEST  2 VIEW  COMPARISON:  04/16/2014 and earlier.   FINDINGS: Interval regressed asymmetric bilateral peribronchovascular opacity. The findings are not completely resolved. Small bilateral pleural effusions. Stable cardiac size and mediastinal contours. Visualized tracheal air column is within normal limits. No pneumothorax. No areas of worsening ventilation. Chronic upper thoracic compression fracture. Stable visualized osseous structures.  IMPRESSION: Regressed bilateral pulmonary opacity compatible with decreasing asymmetric pulmonary edema. Small bilateral effusions.   Electronically Signed   By: Lars Pinks M.D.   On: 04/18/2014 08:20   Dg Chest 2 View  04/16/2014   CLINICAL DATA:  63 year old male with shortness of breath.  EXAM: CHEST  2 VIEW  COMPARISON:  04/14/2014 and prior chest radiographs dating back to 02/05/2007  FINDINGS: New bilateral central airspace opacities identified-likely moderate pulmonary edema.  Tiny bilateral pleural effusions are noted.  There is no evidence of pneumothorax or acute bony abnormality.  Upper limits normal heart size again noted.  IMPRESSION: New bilateral central airspace opacities likely representing moderate pulmonary edema. Tiny bilateral pleural effusions.   Electronically Signed   By: Hassan Rowan M.D.   On: 04/16/2014 08:43   Ct Head Wo Contrast  04/20/2014   CLINICAL DATA:  Altered mental status.  EXAM: CT HEAD WITHOUT CONTRAST  TECHNIQUE: Contiguous axial images were obtained from the base of the skull through the vertex without intravenous contrast.  COMPARISON:  Head CT scan 06/22/2011.  Brain MRI 06/30/2011.  FINDINGS: There is no evidence of acute intracranial abnormality including infarct, hemorrhage, mass lesion, mass effect or abnormal extra-axial fluid collection. No hydrocephalus or pneumocephalus. The calvarium is intact. Imaged paranasal sinuses and mastoid air cells are clear.  IMPRESSION: Negative head CT.   Electronically Signed   By: Inge Rise M.D.   On: 04/20/2014 11:31   US  Renal  04/14/2014   CLINICAL DATA:  63 year old male with left costovertebral angle pain and tenderness. Initial encounter. Urinary tract infection.  EXAM: RENAL/URINARY TRACT ULTRASOUND COMPLETE  COMPARISON:  [facility] CT Abdomen and Pelvis 02/24/2014.  FINDINGS: Right Kidney:  Length: 13.6 cm. Small area of upper pole scarring demonstrated on image 3 today, series 2, image 26 of the comparison. Otherwise echogenicity within normal limits. No mass or  hydronephrosis visualized.  Left Kidney:  Length: 12.5 cm. Echogenicity within normal limits. No mass or hydronephrosis visualized.  Bladder:  Decompressed, not visible.  IMPRESSION: Negative sonographic appearance of the kidneys. Decompressed bladder.   Electronically Signed   By: Lars Pinks M.D.   On: 04/14/2014 10:11   Dg Chest Port 1 View  04/14/2014   CLINICAL DATA:  Fever, sepsis  EXAM: PORTABLE CHEST - 1 VIEW  COMPARISON:  06/28/2011  FINDINGS: Cardiomegaly with pulmonary vascular congestion and possible mild interstitial edema. Mild bilateral lower lobe opacities, likely atelectasis. No focal consolidation. No pleural effusion or pneumothorax.  IMPRESSION: Screening loop pulmonary vascular congestion and possible mild interstitial edema.  Mild patchy bilateral lower lobe opacities, likely atelectasis.   Electronically Signed   By: Julian Hy M.D.   On: 04/14/2014 08:34    ECG  Atrial fibrillation with RVR 138  ASSESSMENT AND PLAN  1. A-fib with RVR  - HR barely controlled, increase diltiazem to 240mg  daily, continue labetalol gtt  - LA moderately dilated on echo, although infection may play a factor, ?however well he will stay in NSR after conversion   - discussed regarding cutting back on ETOH and caffeine  - CHA2DS2-Vasc score 3-5 (CHF, HTN, DM +/- TIA), may need systemic anticoagulation as unknown duration of a-fib. If so, can replaced plavix for TIA. Will need to stop prior to surgery and colonoscopy.  2. URI 3.  Acute diastolic CHF 2/2 infection, HTN and a-fib  - Echo preserved EF. LA size 4.9 cm 4. Hypertension: will need better control 5. history of prostate cancer status post prostatectomy 6. newly diagnosed colovesicular fistula  - seen by surgery, no surgical intervention during this admission. Will need colonscopy later before attempt surgical intervention 7. Diabetes 8. alcohol abuse: discussed with cessation as it increase PAF 9. tobacco abuse 10. TIA: ?delirium  - CT of head negative, plavix added as patient allergic to NSAIDs 11. Leukocytosis: WBC peaked 22, returned to normal after abx   Hilbert Corrigan, PA-C 04/20/2014, 3:31 PM   Patient seen and examined with Almyra Deforest, PA-C. We discussed all aspects of the encounter. I agree with the assessment and plan as stated above.   Patient with new AF in setting of severe medical illness and ETOH withdrawal. Has now converted to sinus rhythm. There is a question of possible TIA and he has been started on Plavix. He is scheduled for surgery to repair his colo-vesicular fistula at end of the month.   At this point, I would continue current regimen with diltiazem. Can switch labetalol to carvedilol as tolerated for HTN. IF AF recurs, would consider amiodarone to help him hold NSR in the peri-operative period. The issue of anti-coagulation is a tough one. With his ETOH abuse and likely underlying cirrhosis I would typically shy away from systemic anticoagulation although his possible TIA does raise the bar significantly. With his upcoming surgery, I do not think Plavix is the best agent as it will have to be washed out. I would prefer either ASA 325 daily vs Eliquis 5mg  bid which can be stopped quickly before surgery. My preference would be ASA but if neuro feels strongly that he has had a TIA I would be ok with a trial of Eliquis.   He has mild fluid overload on exam and would diurese as tolerated.   Daniel Bensimhon,MD 5:50 PM

## 2014-04-20 NOTE — Progress Notes (Signed)
Pt's wife called staff into the room as he was having another episode at 1345. RN ran to pt's room and he was staring off and could not verbally respond to staff. Was moving his eyes in response to noise in the room, pupils were equal, round and reacted to light briskly. Pt was non-verbal and unable to follow commands for about 4 minutes. BP at this time was 188/152, HR 125. Episode lasted from 1327-1331. At 1331 BP was 159/107. Pt was able to verbally respond after about 4 minutes, but it was noticed that it took him a little longer to speak. He had some slight dysarthria and delayed responses at first, but that cleared in about 2 minutes. Pt returned to "his normal", neuro check was intact. MD came to bedside and stated that pt needed to be transferred to stepdown for a labetalol drip to control his blood pressure. MD believes pt is having small TIAs in response to his blood pressure being so elevated. Pt was stable at the end of the event. Bed request placed. Bed received, pt to be transferred to Rm 1230. Stepdown RN called to get report. Report given to Amy, RN and pt transferred to stepdown. Pt stable at the time of transfer, last BP at 1413 was 166/81 HR 120-130's. Stepdown RN will continue to monitor pt.  Othella Boyer [facility]

## 2014-04-20 NOTE — Significant Event (Signed)
Rapid Response Event Note  Overview:      Initial Focused Assessment:Neuro   Interventions:   Event Summary: RRT called to [place]. Pt standing at desk with stare on face, not responding to commands. Nonverbal. Able to look at RN in the face but unable to make speech. Will not sit in chair behind him. Neuro check performed. Unable to [facility] with grips, facial, or speech. Wife states this is the second time this am the event has occurred. Bedside RN and Charge RN paged Dr Dyann Kief - on the way to floor to see pt. VS: SBP 170-150's, DBP 140-120, HR 170-135 AFib, RR 20's, Sats 100% on RA.  CBG 264. Event lasted approximately 4-5 minutes then pt able to answer questions, Follow Commands and and sit in chair. Pushed back to room, assisted in bed for EKG- AFIB with RVR in 130's. Dr Dyann Kief in room. Orders received. Taken to CT  And back with bedside RN. Pt conversing with bedside RN, wife and RRT RN. Bedside RN informed to call RRT if needed further.    at      at          [place], [place]

## 2014-04-20 NOTE — Progress Notes (Addendum)
[facility] PROGRESS NOTE  Douglas Ballard [id number] DOB: August 23, 1950 DOA: 04/14/2014 PCP: Stephens Shire, MD Interim summary 63 y.o. male has a past medical history significant for HTN, prostate cancer s/p prostatectomy by Urology (Dr. Risa Grill), newly diagnosed colovesicular fistula followed by Dr. Johney Maine and with planned surgical intervention soon, presents to the ED with new onset left flank pain and dysuria. Found to have sepsis due to UTI (E.Coli) and positive blood cx's (proteus mirabilis). admisison complicated with vascular congestion/pulmonary edema, acute renal failure and atrial fibrillation.  Assessment/Plan: 1-sepsis due to UTI and ? bacteremia: E. Coli and proteus mirabilis isolated -will continue antibiotics, at this time switching to by mouth Levaquin X 8 more days -repeated blood cx's pending (8/28); negative up to date -continue PRN antiemetics, antipyretics and analgesics -etiology is his colovesical fistula  2-acute renal failure: secondary to UTI, NSAID's and continue use of avapro -IVF's changed to [facility] due to vascular congestion, SOB and mild pulmonary edema -avapro continue to be on hold -follow renal function trend; continue improving creatinine 1.15 (9/1) -will follow BMET in a.m. -continue treatment of UTI  3-bacteremia due to proteus mirabilis: most likely translocation from colo-vesicula fistula and UTI -pan-sensitivity  -continue treatment now with levaquin 750mg  X 8 more days  4-hyponatremia: secondary to alcohol abuse and dehydration initially; then secondary to hypervolemic state  -mild improved with IVF's initially; with subsequent worsening renal function and development of vascular congestion/mild pulmonary edema -Continue restricting IVF's to 1.5-2 L daily -will continue lasix 20mg  daily -NA 131  5-HTN with hypertensive emergency: Continue holding avapro due to recent ARF -lasix also helping with BP -Due to a cute atrial fibrillation  episode Norvasc has been discontinue  -antihypertensive drug regimen is a diltiazem, metoprolol and BiDil -will transfer to stepdown and start tx with labetalol drip to control BP  6-alcohol abuse with delirium/withdrawal: CIWA protocol completed,  -Continue thiamine and folic acid -cessation counseling provided -Mentation is now back to baseline. -Will use as needed Ativan for anxiety  7-tobacco abuse: will start nicotine patch -cessation counseling provided  8-leukocytosis: due to #1 -continue antibiotics -follow WBC's trend; back to within normal limits now -patient is afebrile and denies abdominal pain or dysuria  9-acute resp failure with hypoxia: due to vascular congestion and pulmonary edema -2-D echo with preserved EF and no WMA. Presumed diastolic HF given uncontrolled hypertension and alcohol abuse. There is LVH appreciated on echo. -as needed supplemental oxygen -Continue PRN nebulizer treatment -continue lasix 20 mg daily -Low-sodium diet has been discussed with the patient along with fluid restriction (1.5-2 L in 24 hours).  10-BPH: will continue flomax  11-oral candidiasis: will continue nystatin -Improving  12-functional diarrhea: due to use of antibiotics -will continue florastor -if persist or worsen will need to check for c. Diff  13-atrial fibrillation: Appears to be secondary to ongoing infection and withdrawal from alcohol abruptly cessation during hospitalization. -Showed brief episode with conversion back to sinus rhythm after diltiazem drip -Will continue diltiazem long acting and low-dose metoprolol -2-D echo has been done and demonstrated no wall motion abnormalities and has preserved ejection fraction.  -due to conversion back into A. Fib cardiology has been consulted  14-TIA: CT stat neg. -will add plavix (patient with NSAID's allergy) -now that platelets are in normal range; will add heparin for DVT prophylaxis. -patient episodes of TIA  associated with hypertensive emergency; will transfer to stepdown and start tx with labetalol drip.  15-hyperglycemia: CBG's 160-200 range. No hx of diabetes prior to admission -  will start SSI moderate -check A1C  Code Status: Full Family Communication: wife at bedside  Disposition Plan: to be determine   Consultants:  [facility]  ID (Dr. Tommy Medal curbside for antibiotics choice and length of therapy)  cardiology  Procedures:  See below for x-ray reports   Antibiotics:  zosyn 8/26-8/28  Rocephin 8/28  HPI/Subjective:  Feeling ok; no fever. Early this morning (9/1) developed acute episode stare on face, not responding to commands. Nonverbal. Able to look at Rn in the face but unable to follow commands. Episode lasted approx 4-5 minutes and then resolved. Patient is back on atrial fibrillation.  Objective: Filed Vitals:   04/20/14 1142  BP: 170/100  Pulse: 125  Temp: 98.4 F (36.9 C)  Resp:     Intake/Output Summary (Last 24 hours) at 04/20/14 1151 Last data filed at 04/20/14 0914  Gross per 24 hour  Intake   1010 ml  Output      0 ml  Net   1010 ml   Filed Weights   04/18/14 0600 04/19/14 0636 04/20/14 0443  Weight: 75.751 kg (167 lb) 76.023 kg (167 lb 9.6 oz) 75.978 kg (167 lb 8 oz)    Exam:   General:  Feeling ok; no fever. Early this morning (9/1) developed acute episode stare on face, not responding to commands. Nonverbal. Able to look at Rn in the face but unable to follow commands. Episode lasted approx 4-5 minutes and then resolved. Patient is back on atrial fibrillation.  Cardiovascular: irregular, no rubs or gallops  Respiratory: no wheezing; no crackles; improved air movement and good oxygen saturation on room air (96-97%)  Abdomen: no distension, positive BS, no guarding  Musculoskeletal: Trace edema, no cyanosis or clubbing  Data Reviewed: Basic Metabolic Panel:  Recent Labs Lab 04/15/14 0526 04/16/14 0450 04/17/14 0404 04/18/14 0507  04/20/14 0442  NA 126* 119* 127* 131* 131*  K 5.1 4.8 3.6* 3.9 3.4*  CL 92* 88* 90* 95* 90*  CO2 19 18* 18* 22 26  GLUCOSE 173* 123* 131* 167* 212*  BUN 24* 35* 42* 35* 28*  CREATININE 1.95* 2.08* 1.90* 1.45* 1.15  CALCIUM 7.4* 7.5* 7.9* 8.5 8.8   Liver Function Tests:  Recent Labs Lab 04/14/14 0655 04/15/14 0526  AST 14 11  ALT 8 6  ALKPHOS 58 45  BILITOT 1.7* 1.4*  PROT 6.7 6.1  ALBUMIN 3.4* 2.7*    Recent Labs Lab 04/14/14 0655  LIPASE 20   CBC:  Recent Labs Lab 04/14/14 0655 04/15/14 0526 04/16/14 0450 04/18/14 0507 04/20/14 0442  WBC 17.5* 22.0* 17.9* 9.7 10.1  NEUTROABS 16.3*  --   --   --   --   HGB 16.2 14.6 13.7 13.8 14.6  HCT 45.5 41.1 37.7* 38.0* 39.9  MCV 94.8 98.6 96.4 93.8 95.2  PLT 167 138* 119* 168 233     Recent Results (from the past 240 hour(s))  CULTURE, BLOOD (ROUTINE X 2)     Status: None   Collection Time    04/14/14  6:59 AM      Result Value Ref Range Status   Specimen Description BLOOD RIGHT FOREARM   Final   Special Requests BOTTLES DRAWN AEROBIC AND ANAEROBIC 5ML   Final   Culture  Setup Time     Final   Value: 04/14/2014 11:11     Performed at [facility]   Culture     Final   Value: NO GROWTH 5 DAYS  Performed at [facility]   Report Status 04/20/2014 FINAL   Final  CULTURE, BLOOD (ROUTINE X 2)     Status: None   Collection Time    04/14/14  7:36 AM      Result Value Ref Range Status   Specimen Description BLOOD LEFT ANTECUBITAL   Final   Special Requests BOTTLES DRAWN AEROBIC AND ANAEROBIC 5ML   Final   Culture  Setup Time     Final   Value: 04/14/2014 11:12     Performed at [facility]   Culture     Final   Value: PROTEUS MIRABILIS     ESCHERICHIA COLI     Note: Gram Stain Report Called to,Read Back By and Verified With: [name] ON 04/14/2014 AT 11:23P BY [name]     Performed at [facility]   Report Status 04/17/2014 FINAL   Final   Organism ID, Bacteria PROTEUS  MIRABILIS   Final   Organism ID, Bacteria ESCHERICHIA COLI   Final  URINE CULTURE     Status: None   Collection Time    04/14/14  7:59 AM      Result Value Ref Range Status   Specimen Description IN/OUT CATH URINE   Final   Special Requests NONE   Final   Culture  Setup Time     Final   Value: 04/14/2014 11:25     Performed at [place]     Final   Value: >=100,000 COLONIES/ML     Performed at [facility]   Culture     Final   Value: [place]     Performed at [facility]   Report Status 04/18/2014 FINAL   Final   Organism ID, Bacteria ESCHERICHIA COLI   Final   Organism ID, Bacteria PROTEUS MIRABILIS   Final  CULTURE, BLOOD (ROUTINE X 2)     Status: None   Collection Time    04/16/14  2:00 PM      Result Value Ref Range Status   Specimen Description BLOOD LEFT ARM   Final   Special Requests BOTTLES DRAWN AEROBIC AND ANAEROBIC  10CC   Final   Culture  Setup Time     Final   Value: 04/16/2014 17:15     Performed at [facility]   Culture     Final   Value:        BLOOD CULTURE RECEIVED NO GROWTH TO DATE CULTURE WILL BE HELD FOR 5 DAYS BEFORE ISSUING A FINAL NEGATIVE REPORT     Performed at [facility]   Report Status PENDING   Incomplete  CULTURE, BLOOD (ROUTINE X 2)     Status: None   Collection Time    04/16/14  2:12 PM      Result Value Ref Range Status   Specimen Description BLOOD LEFT HAND   Final   Special Requests BOTTLES DRAWN AEROBIC ONLY  5CC    Final   Culture  Setup Time     Final   Value: 04/16/2014 17:16     Performed at [facility]   Culture     Final   Value:        BLOOD CULTURE RECEIVED NO GROWTH TO DATE CULTURE WILL BE HELD FOR 5 DAYS BEFORE ISSUING A FINAL NEGATIVE REPORT     Performed at [facility]   Report Status PENDING  Incomplete  CLOSTRIDIUM DIFFICILE BY PCR     Status: None   Collection Time    04/19/14  6:42 PM      Result Value Ref  Range Status   C difficile by pcr NEGATIVE  NEGATIVE Final   Comment: Performed at [facility]     Studies: Ct Head Wo Contrast  04/20/2014   CLINICAL DATA:  Altered mental status.  EXAM: CT HEAD WITHOUT CONTRAST  TECHNIQUE: Contiguous axial images were obtained from the base of the skull through the vertex without intravenous contrast.  COMPARISON:  Head CT scan 06/22/2011.  Brain MRI 06/30/2011.  FINDINGS: There is no evidence of acute intracranial abnormality including infarct, hemorrhage, mass lesion, mass effect or abnormal extra-axial fluid collection. No hydrocephalus or pneumocephalus. The calvarium is intact. Imaged paranasal sinuses and mastoid air cells are clear.  IMPRESSION: Negative head CT.   Electronically Signed   By: Inge Rise M.D.   On: 04/20/2014 11:31    Scheduled Meds: . budesonide (PULMICORT) nebulizer solution  0.25 mg Nebulization BID  . diltiazem  180 mg Oral Daily  . feeding supplement (ENSURE COMPLETE)  237 mL Oral TID BM  . folic acid  1 mg Oral Daily  . furosemide  20 mg Oral Daily  . heparin subcutaneous  5,000 Units Subcutaneous 3 times per day  . isosorbide-hydrALAZINE  1 tablet Oral BID  . levofloxacin  750 mg Oral Q24H  . metoprolol tartrate  12.5 mg Oral BID  . multivitamin with minerals  1 tablet Oral Daily  . nicotine  14 mg Transdermal Daily  . nystatin  5 mL Oral QID  . potassium chloride  40 mEq Oral Daily  . saccharomyces boulardii  250 mg Oral BID  . sodium chloride  3 mL Intravenous Q12H  . tamsulosin  0.4 mg Oral QPC supper  . thiamine  100 mg Oral Daily   Continuous Infusions:    Principal Problem:   Sepsis due to urinary tract infection Active Problems:   HTN (hypertension)   Personal history of prostate cancer s/p prostatectomy 2012   Hyponatremia   Alcohol abuse   Tobacco abuse   Colovesical fistula   Sepsis   Urinary tract infection   Elevated bilirubin    Time spent: < 30 minutes    Barton Dubois  [facility] Pager [phone]. If 7PM-7AM, please contact night-coverage at [email or web address], password [facility] 04/20/2014, 11:51 AM  LOS: 6 days

## 2014-04-20 NOTE — Progress Notes (Signed)
Inpatient Diabetes Program Recommendations  AACE/ADA: New Consensus Statement on Inpatient Glycemic Control (2013)  Target Ranges:  Prepandial:   less than 140 mg/dL      Peak postprandial:   less than 180 mg/dL (1-2 hours)      Critically ill patients:  140 - 180 mg/dL   Reason for Visit: Hyperglycemia  Diabetes history: None Outpatient Diabetes medications: None Current orders for Inpatient glycemic control: None  63 y.o. male has a past medical history significant for HTN, prostate cancer s/p prostatectomy by Urology (Dr. Risa Grill), newly diagnosed colovesicular fistula followed by Dr. Johney Maine and with planned surgical intervention soon, presents to the ED with new onset left flank pain and dysuria. No previous hx DM.  Blood sugars >200 mg/dL.  Inpatient Diabetes Program Recommendations Correction (SSI): Add Novolog sensitive tidwc and hs HgbA1C: Check HgbA1C to assess glycemic control prior to hospitalizaiton Diet: Add CHO mod med to heart healthy diet  Note: Will continue to follow. Thank you. Lorenda Peck, RD, LDN, CDE Inpatient Diabetes Coordinator [phone]

## 2014-04-20 NOTE — Progress Notes (Signed)
Pt's wife called out from the room and stated pt was unresponsive. RN ran to room to assess pt and he was breathing unlabored and was beginning to talk at that time. Pt's wife reported to RN that she was talking to the pt and he would not respond. Pt reported to RN that he heard his wife and that he felt like he was "zoned out watching the TV, and that he heard his wife, but just didn't respond to her." Neuro check preformed and it was within normal limits. Pt reported that he felt fine and had no pain, nausea, or dizziness. Will continue to monitor pt.   Othella Boyer [facility]

## 2014-04-21 ENCOUNTER — Ambulatory Visit: Payer: BC Managed Care – PPO | Admitting: Physician Assistant

## 2014-04-21 ENCOUNTER — Inpatient Hospital Stay (HOSPITAL_COMMUNITY): Payer: BC Managed Care – PPO

## 2014-04-21 ENCOUNTER — Inpatient Hospital Stay (HOSPITAL_COMMUNITY)
Admit: 2014-04-21 | Discharge: 2014-04-21 | Disposition: A | Discharge status: Home / Self Care | Payer: BC Managed Care – PPO | Attending: Neurology | Admitting: Neurology

## 2014-04-21 DIAGNOSIS — I10 Essential (primary) hypertension: Secondary | ICD-10-CM

## 2014-04-21 DIAGNOSIS — I509 Heart failure, unspecified: Secondary | ICD-10-CM

## 2014-04-21 DIAGNOSIS — I5031 Acute diastolic (congestive) heart failure: Secondary | ICD-10-CM

## 2014-04-21 LAB — HEPARIN LEVEL (UNFRACTIONATED): Heparin Unfractionated: <0.1 IU/mL — ABNORMAL LOW (ref 0.30–0.70)

## 2014-04-21 LAB — GLUCOSE, CAPILLARY
GLUCOSE-CAPILLARY: 263 mg/dL — AB (ref 70–99)
GLUCOSE-CAPILLARY: 253 mg/dL — AB (ref 70–99)
Glucose-Capillary: 272 mg/dL — ABNORMAL HIGH (ref 70–99)
Glucose-Capillary: 203 mg/dL — ABNORMAL HIGH (ref 70–99)
Glucose-Capillary: 219 mg/dL — ABNORMAL HIGH (ref 70–99)

## 2014-04-21 LAB — BASIC METABOLIC PANEL
Anion gap: 14 (ref 5–15)
BUN: 26 mg/dL — AB (ref 6–23)
CHLORIDE: 87 meq/L — AB (ref 96–112)
CO2: 25 mEq/L (ref 19–32)
Calcium: 8.3 mg/dL — ABNORMAL LOW (ref 8.4–10.5)
Creatinine, Ser: 1.38 mg/dL — ABNORMAL HIGH (ref 0.50–1.35)
GFR calc Af Amer: 62 mL/min — ABNORMAL LOW (ref >90)
GFR calc non Af Amer: 53 mL/min — ABNORMAL LOW (ref >90)
Glucose, Bld: 277 mg/dL — ABNORMAL HIGH (ref 70–99)
POTASSIUM: 3.6 meq/L — AB (ref 3.7–5.3)
Sodium: 126 mEq/L — ABNORMAL LOW (ref 137–147)

## 2014-04-21 LAB — PROTIME-INR
INR: 1.21 (ref 0.00–1.49)
Prothrombin Time: 15.3 seconds — ABNORMAL HIGH (ref 11.6–15.2)

## 2014-04-21 LAB — APTT: APTT: 36 s (ref 24–37)

## 2014-04-21 MED ORDER — HEPARIN (PORCINE) IN NACL 100-0.45 UNIT/ML-% IJ SOLN
850.0000 [IU]/h | INTRAMUSCULAR | Status: DC
  Administered 2014-04-21: 850 [IU]/h via INTRAVENOUS
  Filled 2014-04-21: qty 250

## 2014-04-21 MED ORDER — DEXTROSE 5 % IV SOLN
0.5000 mg/min | INTRAVENOUS | Status: DC
  Filled 2014-04-21: qty 100

## 2014-04-21 MED ORDER — INSULIN ASPART 100 UNIT/ML ~~LOC~~ SOLN: 0.0000 [IU] | Freq: Three times a day (TID) | SUBCUTANEOUS | Status: DC

## 2014-04-21 MED ORDER — HEPARIN (PORCINE) IN NACL 100-0.45 UNIT/ML-% IJ SOLN
1000.0000 [IU]/h | INTRAMUSCULAR | Status: DC
  Filled 2014-04-21: qty 250

## 2014-04-21 MED ORDER — ENSURE COMPLETE PO LIQD
237.0000 mL | Freq: Two times a day (BID) | ORAL | Status: DC
  Administered 2014-04-21 – 2014-04-23 (×4): 237 mL via ORAL

## 2014-04-21 MED ORDER — LABETALOL HCL 200 MG PO TABS
200.0000 mg | ORAL_TABLET | Freq: Three times a day (TID) | ORAL | Status: DC
  Administered 2014-04-21 (×3): 200 mg via ORAL
  Filled 2014-04-21 (×6): qty 1

## 2014-04-21 MED ORDER — INSULIN GLARGINE 100 UNIT/ML ~~LOC~~ SOLN
5.0000 [IU] | Freq: Every day | SUBCUTANEOUS | Status: DC
  Administered 2014-04-21 – 2014-04-22 (×2): 5 [IU] via SUBCUTANEOUS
  Filled 2014-04-21 (×4): qty 0.05

## 2014-04-21 MED ORDER — INSULIN ASPART 100 UNIT/ML ~~LOC~~ SOLN
0.0000 [IU] | Freq: Every day | SUBCUTANEOUS | Status: DC
  Administered 2014-04-21: 2 [IU] via SUBCUTANEOUS

## 2014-04-21 MED ORDER — INSULIN ASPART 100 UNIT/ML ~~LOC~~ SOLN
0.0000 [IU] | Freq: Three times a day (TID) | SUBCUTANEOUS | Status: DC
  Administered 2014-04-21 – 2014-04-22 (×2): 5 [IU] via SUBCUTANEOUS
  Administered 2014-04-22: 3 [IU] via SUBCUTANEOUS
  Administered 2014-04-22 – 2014-04-23 (×2): 5 [IU] via SUBCUTANEOUS
  Administered 2014-04-23: 3 [IU] via SUBCUTANEOUS

## 2014-04-21 NOTE — Progress Notes (Signed)
EEG Completed; Results Pending  

## 2014-04-21 NOTE — Progress Notes (Signed)
CARE MANAGE MENT UTILIZATION REVIEW NOTE 04/21/2014     Patient:  Douglas Ballard, Douglas Ballard   Account Number:  [id number]  Documented by: Verta Ellen   Per Ur Regulation Reviewed for med. necessity/level of care/duration of stay

## 2014-04-21 NOTE — Progress Notes (Signed)
Dr.Tat informed via text of elevated blood sugars today. Referred to Diabetes Coordinator note.

## 2014-04-21 NOTE — Progress Notes (Signed)
ANTICOAGULATION CONSULT NOTE - Initial Consult  Pharmacy Consult for Heparin Indication: recurrent TIAs (stroke protocol dosing)  Allergies  Allergen Reactions  . Nsaids     Kidney disease/failure  . Sulfa Antibiotics Other (See Comments)    headaches    Patient Measurements: Height: 5\' 9"  (175.3 cm) Weight: 171 lb 1.2 oz (77.6 kg) IBW/kg (Calculated) : 70.7   Vital Signs: Temp: 97.8 F (36.6 C) (09/02 0800) Temp src: Oral (09/02 0800) BP: 122/69 mmHg (09/02 0836) Pulse Rate: 68 (09/02 0800)  Labs:  Recent Labs  04/20/14 0442  HGB 14.6  HCT 39.9  PLT 233  CREATININE 1.15    Estimated Creatinine Clearance: 66.6 ml/min (by C-G formula based on Cr of 1.15).   Medical History: Past Medical History  Diagnosis Date  . Hypertension   . Cancer     Prostate surgery  . Headache(784.0)   . Pneumonia   . Diabetes mellitus   . 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   Marland Kitchen PNA (pneumonia) 06/26/2011  . Compression fracture of thoracic vertebra 06/26/2011  . Fistula, bladder     Medications:  Scheduled:  . budesonide (PULMICORT) nebulizer solution  0.25 mg Nebulization BID  . clopidogrel  75 mg Oral Daily  . diltiazem  240 mg Oral Daily  . feeding supplement (ENSURE COMPLETE)  237 mL Oral TID BM  . folic acid  1 mg Oral Daily  . furosemide  20 mg Oral Daily  . heparin subcutaneous  5,000 Units Subcutaneous 3 times per day  . insulin aspart  0-15 Units Subcutaneous TID WC  . isosorbide-hydrALAZINE  1 tablet Oral BID  . labetalol  200 mg Oral TID  . levofloxacin  750 mg Oral Q24H  . multivitamin with minerals  1 tablet Oral Daily  . nicotine  14 mg Transdermal Daily  . nystatin  5 mL Oral QID  . potassium chloride  40 mEq Oral Daily  . saccharomyces boulardii  250 mg Oral BID  . sodium chloride  3 mL Intravenous  Q12H  . tamsulosin  0.4 mg Oral QPC supper  . thiamine  100 mg Oral Daily   Infusions:  . labetalol (NORMODYNE) infusion Stopped (04/21/14 0838)   PRN: HYDROmorphone (DILAUDID) injection, ipratropium-albuterol, LORazepam, ondansetron (ZOFRAN) IV, sodium chloride, zolpidem  Assessment: 63 y/o M with transient PAF and three recent episodes of transient speech arrest.  Per neurology, differential diagnosis includes TIA vs simple partial seizure.  Orders received to begin IV heparin per stroke protocol (no bolus, low initial dose) with pharmacy dosing assistance.  Concomitant Plavix noted.   Goal of Therapy:  Heparin level 0.3-0.5 Monitor platelets by anticoagulation protocol: Yes   Plan:  1. Baseline PTT, PT 2. DC SQ Heparin 3. Begin IV Heparin at 850 units/hr (no bolus) 4. Heparin level at 5pm. 5. Daily heparin level, CBC. 6. Follow clinical course.   Clayburn Pert, PharmD, BCPS Pager: [phone] 04/21/2014  10:18 AM

## 2014-04-21 NOTE — Procedures (Signed)
History: 63 yo M with transient aphasia  Sedation: None  Technique: This is a 17 channel routine scalp EEG performed at the bedside with bipolar and monopolar montages arranged in accordance to the international 10/20 system of electrode placement. One channel was dedicated to EKG recording.    Background: The background consists of intermixed delta and beta activities.There is a well defined posterior dominant rhythm of 8.5 Hz that attenuates with eye opening. There is a mild increase in slow activity associated with drowsiness. There was no sleep recorded.  Photic stimulation: Physiologic driving is not performed  EEG Abnormalities: none  Clinical Interpretation: This normal EEG is recorded in the waking state and drowsy state. There was no seizure or seizure predisposition recorded on this study.   Roland Rack, MD [facility] [phone]  If 7pm- 7am, please page neurology on call as listed in [place].

## 2014-04-21 NOTE — Progress Notes (Signed)
PROGRESS NOTE  Douglas Ballard [id number] DOB: [birth year] DOA: 04/14/2014 PCP: Stephens Shire, MD  Assessment/Plan: sepsis  -present at time of admission -due to UTI and bacteremia: E. 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  acute renal failure: - secondary to UTI, NSAID's and continue use of avapro  -IVF's changed to [facility] due to vascular congestion, SOB and mild pulmonary edema  -avapro continue to be on hold  -follow renal function trend; continue improving creatinine 1.4 (8/30)  -will follow BMET bacteremia -Secondary to Escherichia coli and Proteus mirabilis -Secondary to genitourinary source -pan-sensitivity  -Continue levofloxacin as discussed above  - Surveillance blood cultures negative  hyponatremia:  -secondary to alcohol abuse and dehydration initially; then secondary to hypervolemic state  -mild improved with IVF's initially; with subsequent worsening renal function and development of vascular congestion/mild pulmonary edema  -Continue restricting IVF's to 1.5-2 L daily  -will continue lasix (will be adjusted to by mouth and just 40 mg twice a day); if patient continue improving clinically will resume daily 20 mg of Lasix on 9/1 Expressive aphasia -had transient, 3-5 min episode on 9/1 -MRI brain -CT brain neg -consulted neuro -continue plavix for now HTN:  -Continue holding avapro due to ARF  -lasix also helping with BP  -Do to new onset paroxysmal atrial fibrillation, Norvasc has been discontinue and antihypertensive drug use is a diltiazem with low-dose metoprolol, will also continue BiDil  alcohol abuse with delirium/withdrawal:  -CIWA protocol completed,  -Continue thiamine and folic  acid  -cessation counseling provided  -Mentation is now back to baseline.  -Will use as needed Ativan for anxiety  tobacco abuse: -cessation counseling provided  Acute Diastolic CHF - Systemic anticoagulation until after surgery if the patient is able to maintain NSR per cardiology recommendations acute resp failure with hypoxia:  -due to vascular congestion and pulmonary edema  -2-D echo with preserved EF and no WMA.uncontrolled hypertension and alcohol abuse. There is LVH appreciated on echo.  -as needed supplemental oxygen  -Continue PRN nebulizer treatment  -continue lasix, transitioned from by mouth 40 mg twice a day times one more day and after that, will resume 20 mg daily  -Low-sodium diet has been discussed with the patient along with fluid restriction (1.5-2 L in 24 hours).  BPH:  -will continue flomax  Diarrhea  - Clostridium difficile PCR  atrial fibrillation:  -Appears to be secondary to ongoing infection and withdrawal from alcohol abruptly cessation during hospitalization.  -Showed brief episode with conversion back to sinus rhythm after diltiazem drip  -Will continue diltiazem long acting  -switch to po labetolol per cardiology -2-D echo has been done and demonstrated no wall motion abnormalities and has preserved ejection fraction. No further cardiac evaluation required at this moment.  Code Status: Full  Family Communication:none today Disposition Plan: to be determine         Procedures/Studies: Dg Chest 2 View  04/18/2014   CLINICAL DATA:  63 year old male with pulmonary edema. Initial encounter.  EXAM: CHEST  2 VIEW  COMPARISON:  04/16/2014 and earlier.  FINDINGS: Interval regressed asymmetric bilateral peribronchovascular opacity. The findings are not completely resolved. Small bilateral pleural effusions. Stable cardiac size and mediastinal contours. Visualized tracheal air column is within normal limits. No pneumothorax. No areas of  worsening ventilation.  Chronic upper thoracic compression fracture. Stable visualized osseous structures.  IMPRESSION: Regressed bilateral pulmonary opacity compatible with decreasing asymmetric pulmonary edema. Small bilateral effusions.   Electronically Signed   By: Lars Pinks M.D.   On: 04/18/2014 08:20   Dg Chest 2 View  04/16/2014   CLINICAL DATA:  64 year old male with shortness of breath.  EXAM: CHEST  2 VIEW  COMPARISON:  04/14/2014 and prior chest radiographs dating back to 02/05/2007  FINDINGS: New bilateral central airspace opacities identified-likely moderate pulmonary edema.  Tiny bilateral pleural effusions are noted.  There is no evidence of pneumothorax or acute bony abnormality.  Upper limits normal heart size again noted.  IMPRESSION: New bilateral central airspace opacities likely representing moderate pulmonary edema. Tiny bilateral pleural effusions.   Electronically Signed   By: Hassan Rowan M.D.   On: 04/16/2014 08:43   Ct Head Wo Contrast  04/20/2014   CLINICAL DATA:  Altered mental status.  EXAM: CT HEAD WITHOUT CONTRAST  TECHNIQUE: Contiguous axial images were obtained from the base of the skull through the vertex without intravenous contrast.  COMPARISON:  Head CT scan 06/22/2011.  Brain MRI 06/30/2011.  FINDINGS: There is no evidence of acute intracranial abnormality including infarct, hemorrhage, mass lesion, mass effect or abnormal extra-axial fluid collection. No hydrocephalus or pneumocephalus. The calvarium is intact. Imaged paranasal sinuses and mastoid air cells are clear.  IMPRESSION: Negative head CT.   Electronically Signed   By: Inge Rise M.D.   On: 04/20/2014 11:31   US Renal  04/14/2014   CLINICAL DATA:  63 year old male with left costovertebral angle pain and tenderness. Initial encounter. Urinary tract infection.  EXAM: RENAL/URINARY TRACT ULTRASOUND COMPLETE  COMPARISON:  [facility] CT Abdomen and Pelvis 02/24/2014.  FINDINGS: Right Kidney:  Length: 13.6 cm. Small  area of upper pole scarring demonstrated on image 3 today, series 2, image 26 of the comparison. Otherwise echogenicity within normal limits. No mass or hydronephrosis visualized.  Left Kidney:  Length: 12.5 cm. Echogenicity within normal limits. No mass or hydronephrosis visualized.  Bladder:  Decompressed, not visible.  IMPRESSION: Negative sonographic appearance of the kidneys. Decompressed bladder.   Electronically Signed   By: Lars Pinks M.D.   On: 04/14/2014 10:11   Dg Chest Port 1 View  04/14/2014   CLINICAL DATA:  Fever, sepsis  EXAM: PORTABLE CHEST - 1 VIEW  COMPARISON:  06/28/2011  FINDINGS: Cardiomegaly with pulmonary vascular congestion and possible mild interstitial edema. Mild bilateral lower lobe opacities, likely atelectasis. No focal consolidation. No pleural effusion or pneumothorax.  IMPRESSION: Screening loop pulmonary vascular congestion and possible mild interstitial edema.  Mild patchy bilateral lower lobe opacities, likely atelectasis.   Electronically Signed   By: Julian Hy M.D.   On: 04/14/2014 08:34         Subjective: Patient denies fevers, chills, headache, chest pain, dyspnea, nausea, vomiting, diarrhea, abdominal pain, dysuria, hematuria   Objective: Filed Vitals:   04/21/14 0500 04/21/14 0600 04/21/14 0800 04/21/14 0836  BP: 149/81 135/77 150/73 122/69  Pulse: 73 71 68   Temp:      TempSrc:      Resp: 17 20 15    Height:      Weight: 77.6 kg (171 lb 1.2 oz)     SpO2: 94% 92% 80%     Intake/Output Summary (Last 24 hours) at 04/21/14 0856 Last data filed at 04/21/14 0800  Gross per 24 hour  Intake   1325 ml  Output      0 ml  Net   1325 ml   Weight change: 1.622 kg (3 lb 9.2 oz) Exam:   General:  Pt is alert, follows commands appropriately, not in acute distress  HEENT: No icterus, No thrush,[place]/AT  Cardiovascular: RRR, S1/S2, no rubs, no gallops  Respiratory: CTA bilaterally, no wheezing, no crackles, no rhonchi  Abdomen: Soft/+BS,  non tender, non distended, no guarding  Extremities: trace LE edema, No lymphangitis, No petechiae, No rashes, no synovitis  Data Reviewed: Basic Metabolic Panel:  Recent Labs Lab 04/15/14 0526 04/16/14 0450 04/17/14 0404 04/18/14 0507 04/20/14 0442  NA 126* 119* 127* 131* 131*  K 5.1 4.8 3.6* 3.9 3.4*  CL 92* 88* 90* 95* 90*  CO2 19 18* 18* 22 26  GLUCOSE 173* 123* 131* 167* 212*  BUN 24* 35* 42* 35* 28*  CREATININE 1.95* 2.08* 1.90* 1.45* 1.15  CALCIUM 7.4* 7.5* 7.9* 8.5 8.8   Liver Function Tests:  Recent Labs Lab 04/15/14 0526  AST 11  ALT 6  ALKPHOS 45  BILITOT 1.4*  PROT 6.1  ALBUMIN 2.7*   No results found for this basename: LIPASE, AMYLASE,  in the last 168 hours No results found for this basename: AMMONIA,  in the last 168 hours CBC:  Recent Labs Lab 04/15/14 0526 04/16/14 0450 04/18/14 0507 04/20/14 0442  WBC 22.0* 17.9* 9.7 10.1  HGB 14.6 13.7 13.8 14.6  HCT 41.1 37.7* 38.0* 39.9  MCV 98.6 96.4 93.8 95.2  PLT 138* 119* 168 233   Cardiac Enzymes: No results found for this basename: CKTOTAL, CKMB, CKMBINDEX, TROPONINI,  in the last 168 hours BNP: No components found with this basename: POCBNP,  CBG:  Recent Labs Lab 04/20/14 1047 04/20/14 1559 04/20/14 2142 04/21/14 0831  GLUCAP 264* 358* 242* 272*    Recent Results (from the past 240 hour(s))  CULTURE, BLOOD (ROUTINE X 2)     Status: None   Collection Time    04/14/14  6:59 AM      Result Value Ref Range Status   Specimen Description BLOOD RIGHT FOREARM   Final   Special Requests BOTTLES DRAWN AEROBIC AND ANAEROBIC 5ML   Final   Culture  Setup Time     Final   Value: 04/14/2014 11:11     Performed at [facility]   Culture     Final   Value: NO GROWTH 5 DAYS     Performed at [facility]   Report Status 04/20/2014 FINAL   Final  CULTURE, BLOOD (ROUTINE X 2)     Status: None   Collection Time    04/14/14  7:36 AM      Result Value Ref Range Status   Specimen  Description BLOOD LEFT ANTECUBITAL   Final   Special Requests BOTTLES DRAWN AEROBIC AND ANAEROBIC 5ML   Final   Culture  Setup Time     Final   Value: 04/14/2014 11:12     Performed at [facility]   Culture     Final   Value: PROTEUS MIRABILIS     ESCHERICHIA COLI     Note: Gram Stain Report Called to,Read Back By and Verified With: [name] ON 04/14/2014 AT 11:23P BY [name]     Performed at [facility]   Report Status 04/17/2014 FINAL   Final   Organism ID, Bacteria PROTEUS MIRABILIS   Final   Organism ID, Bacteria ESCHERICHIA COLI   Final  URINE CULTURE  Status: None   Collection Time    04/14/14  7:59 AM      Result Value Ref Range Status   Specimen Description IN/OUT CATH URINE   Final   Special Requests NONE   Final   Culture  Setup Time     Final   Value: 04/14/2014 11:25     Performed at [place]     Final   Value: >=100,000 COLONIES/ML     Performed at [place]     Final   Value: [place]     Performed at [facility]   Report Status 04/18/2014 FINAL   Final   Organism ID, Bacteria ESCHERICHIA COLI   Final   Organism ID, Bacteria PROTEUS MIRABILIS   Final  CULTURE, BLOOD (ROUTINE X 2)     Status: None   Collection Time    04/16/14  2:00 PM      Result Value Ref Range Status   Specimen Description BLOOD LEFT ARM   Final   Special Requests BOTTLES DRAWN AEROBIC AND ANAEROBIC  10CC   Final   Culture  Setup Time     Final   Value: 04/16/2014 17:15     Performed at [facility]   Culture     Final   Value:        BLOOD CULTURE RECEIVED NO GROWTH TO DATE CULTURE WILL BE HELD FOR 5 DAYS BEFORE ISSUING A FINAL NEGATIVE REPORT     Performed at [facility]   Report Status PENDING   Incomplete  CULTURE, BLOOD (ROUTINE X 2)     Status: None   Collection Time    04/16/14  2:12 PM      Result Value Ref Range Status   Specimen Description BLOOD  LEFT HAND   Final   Special Requests BOTTLES DRAWN AEROBIC ONLY  5CC    Final   Culture  Setup Time     Final   Value: 04/16/2014 17:16     Performed at [facility]   Culture     Final   Value:        BLOOD CULTURE RECEIVED NO GROWTH TO DATE CULTURE WILL BE HELD FOR 5 DAYS BEFORE ISSUING A FINAL NEGATIVE REPORT     Performed at [facility]   Report Status PENDING   Incomplete  CLOSTRIDIUM DIFFICILE BY PCR     Status: None   Collection Time    04/19/14  6:42 PM      Result Value Ref Range Status   C difficile by pcr NEGATIVE  NEGATIVE Final   Comment: Performed at [facility]  MRSA PCR SCREENING     Status: None   Collection Time    04/20/14  2:38 PM      Result Value Ref Range Status   MRSA by PCR NEGATIVE  NEGATIVE Final   Comment:            The GeneXpert MRSA Assay (FDA     approved for NASAL specimens     only), is one component of a     comprehensive MRSA colonization     surveillance program. It is not     intended to diagnose MRSA     infection nor to guide or     monitor treatment for     MRSA infections.     Scheduled Meds: .  budesonide (PULMICORT) nebulizer solution  0.25 mg Nebulization BID  . clopidogrel  75 mg Oral Daily  . diltiazem  240 mg Oral Daily  . feeding supplement (ENSURE COMPLETE)  237 mL Oral TID BM  . folic acid  1 mg Oral Daily  . furosemide  20 mg Oral Daily  . heparin subcutaneous  5,000 Units Subcutaneous 3 times per day  . insulin aspart  0-15 Units Subcutaneous TID WC  . isosorbide-hydrALAZINE  1 tablet Oral BID  . labetalol  200 mg Oral TID  . levofloxacin  750 mg Oral Q24H  . multivitamin with minerals  1 tablet Oral Daily  . nicotine  14 mg Transdermal Daily  . nystatin  5 mL Oral QID  . potassium chloride  40 mEq Oral Daily  . saccharomyces boulardii  250 mg Oral BID  . sodium chloride  3 mL Intravenous Q12H  . tamsulosin  0.4 mg Oral QPC supper  . thiamine  100 mg Oral Daily   Continuous Infusions: .  labetalol (NORMODYNE) infusion Stopped (04/21/14 0838)     Conrado Nance, DO  [facility] Pager [phone]  If 7PM-7AM, please contact night-coverage [email or web address] Password [email or web address] 04/21/2014, 8:56 AM   LOS: 7 days

## 2014-04-21 NOTE — Consult Note (Signed)
Referring Physician: TAT    Chief Complaint: transient expressive aphasia  HPI:                                                                                                                                         Douglas Ballard is an 63 y.o. male with past medical history of hypertension, history of prostate cancer status post prostatectomy, newly diagnosed colovesicular fistula, diabetes, alcohol abuse, and tobacco abuse presented with fever, chill, acute renal failure: secondary to UTI, NSAID's, UTI, pulm congestion, hyponatremia, bacteremia and transient PAF thought to be secondary to abrupt ETOH with drawl. Per notes, early in the AM on 04-20-2014 patient had a brief episode of staring and not responding to commands. Episode lasted for 4-5 minutes and then resolved. At that time patient was back in Afib. Due to episode patient was placed on Plavix and CT head was obtained. CT head was normal. Neurology consult was called on 04-21-2014. MRI brain has been ordered.    Glucose--272  04-20-2014 NA--131 K--3.4 WBC--10.1 PLT 233 12 lead --Afib with RVR  Date last known well: Date: 04/20/2014 Time last known well: Unable to determine tPA Given: No: symptoms resolved  Past Medical History  Diagnosis Date  . Hypertension   . Cancer     Prostate surgery  . Headache(784.0)   . Pneumonia   . Diabetes mellitus   . 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   Marland Kitchen PNA (pneumonia) 06/26/2011  . Compression fracture of thoracic vertebra 06/26/2011  . Fistula, bladder     Past Surgical History  Procedure Laterality Date  . Robot assisted laparoscopic radical prostatectomy  01/31/2011    Robotic-assisted laparoscopic radical retropubic  . Lipoma excision  2012    Dr Nonah Mattes    Family History  Problem Relation Age of Onset  . Atrial  fibrillation Father     onset 31s. Had pacemaker placed for sinus pause   Social History:  reports that he has been smoking Cigarettes.  He has a 90 pack-year smoking history. He has never used smokeless tobacco. He reports that he drinks about 3.5 - 5 ounces of alcohol per week. He reports that he does not use illicit drugs.  Allergies:  Allergies  Allergen Reactions  . Nsaids     Kidney disease/failure  . Sulfa Antibiotics Other (See Comments)    headaches    Medications:  Scheduled: . budesonide (PULMICORT) nebulizer solution  0.25 mg Nebulization BID  . clopidogrel  75 mg Oral Daily  . diltiazem  240 mg Oral Daily  . feeding supplement (ENSURE COMPLETE)  237 mL Oral TID BM  . folic acid  1 mg Oral Daily  . furosemide  20 mg Oral Daily  . heparin subcutaneous  5,000 Units Subcutaneous 3 times per day  . insulin aspart  0-15 Units Subcutaneous TID WC  . isosorbide-hydrALAZINE  1 tablet Oral BID  . labetalol  200 mg Oral TID  . levofloxacin  750 mg Oral Q24H  . multivitamin with minerals  1 tablet Oral Daily  . nicotine  14 mg Transdermal Daily  . nystatin  5 mL Oral QID  . potassium chloride  40 mEq Oral Daily  . saccharomyces boulardii  250 mg Oral BID  . sodium chloride  3 mL Intravenous Q12H  . tamsulosin  0.4 mg Oral QPC supper  . thiamine  100 mg Oral Daily    ROS:                                                                                                                                       History obtained from the patient  General ROS: negative for - chills, fatigue, fever, night sweats, weight gain or weight loss Psychological ROS: negative for - behavioral disorder, hallucinations, memory difficulties, mood swings or suicidal ideation Ophthalmic ROS: negative for - blurry vision, double vision, eye pain or loss of vision ENT ROS:  negative for - epistaxis, nasal discharge, oral lesions, sore throat, tinnitus or vertigo Allergy and Immunology ROS: negative for - hives or itchy/watery eyes Hematological and Lymphatic ROS: negative for - bleeding problems, bruising or swollen lymph nodes Endocrine ROS: negative for - galactorrhea, hair pattern changes, polydipsia/polyuria or temperature intolerance Respiratory ROS: negative for - cough, hemoptysis, shortness of breath or wheezing Cardiovascular ROS: negative for - chest pain, dyspnea on exertion, edema or irregular heartbeat Gastrointestinal ROS: negative for - abdominal pain, diarrhea, hematemesis, nausea/vomiting or stool incontinence Genito-Urinary ROS: negative for - dysuria, hematuria, incontinence or urinary frequency/urgency Musculoskeletal ROS: negative for - joint swelling or muscular weakness Neurological ROS: as noted in HPI Dermatological ROS: negative for rash and skin lesion changes  Neurologic Examination:                                                                                                      Blood pressure 122/69, pulse 68, temperature  97.8 F (36.6 C), temperature source Oral, resp. rate 15, height 5\' 9"  (1.753 m), weight 77.6 kg (171 lb 1.2 oz), SpO2 97.00%.   General: NAD Mental Status: Alert, oriented, thought content appropriate.  Speech fluent without evidence of aphasia.  Able to follow 3 step commands without difficulty. Cranial Nerves: II: Discs flat bilaterally; Visual fields grossly normal, pupils equal, round, reactive to light and accommodation III,IV, VI: ptosis not present, extra-ocular motions intact bilaterally V,VII: smile symmetric, facial light touch sensation normal bilaterally VIII: hearing normal bilaterally IX,X: gag reflex present XI: bilateral shoulder shrug XII: midline tongue extension without atrophy or fasciculations  Motor: Right : Upper extremity   5/5    Left:     Upper extremity   5/5  Lower extremity    5/5     Lower extremity   5/5 Tone and bulk:normal tone throughout; no atrophy noted Sensory: Pinprick and light touch intact throughout, bilaterally Deep Tendon Reflexes:  Right: Upper Extremity   Left: Upper extremity   biceps (C-5 to C-6) 2/4   biceps (C-5 to C-6) 2/4 tricep (C7) 2/4    triceps (C7) 2/4 Brachioradialis (C6) 2/4  Brachioradialis (C6) 2/4  Lower Extremity Lower Extremity  quadriceps (L-2 to L-4) 2/4   quadriceps (L-2 to L-4) 2/4 Achilles (S1) 2/4   Achilles (S1) 2/4  Plantars: Right: downgoing   Left: downgoing Cerebellar: normal finger-to-nose,  normal heel-to-shin test Gait: not tested CV: pulses palpable throughout    Lab Results: Basic Metabolic Panel:  Recent Labs Lab 04/15/14 0526 04/16/14 0450 04/17/14 0404 04/18/14 0507 04/20/14 0442  NA 126* 119* 127* 131* 131*  K 5.1 4.8 3.6* 3.9 3.4*  CL 92* 88* 90* 95* 90*  CO2 19 18* 18* 22 26  GLUCOSE 173* 123* 131* 167* 212*  BUN 24* 35* 42* 35* 28*  CREATININE 1.95* 2.08* 1.90* 1.45* 1.15  CALCIUM 7.4* 7.5* 7.9* 8.5 8.8    Liver Function Tests:  Recent Labs Lab 04/15/14 0526  AST 11  ALT 6  ALKPHOS 45  BILITOT 1.4*  PROT 6.1  ALBUMIN 2.7*   No results found for this basename: LIPASE, AMYLASE,  in the last 168 hours No results found for this basename: AMMONIA,  in the last 168 hours  CBC:  Recent Labs Lab 04/15/14 0526 04/16/14 0450 04/18/14 0507 04/20/14 0442  WBC 22.0* 17.9* 9.7 10.1  HGB 14.6 13.7 13.8 14.6  HCT 41.1 37.7* 38.0* 39.9  MCV 98.6 96.4 93.8 95.2  PLT 138* 119* 168 233    Cardiac Enzymes: No results found for this basename: CKTOTAL, CKMB, CKMBINDEX, TROPONINI,  in the last 168 hours  Lipid Panel: No results found for this basename: CHOL, TRIG, HDL, CHOLHDL, VLDL, LDLCALC,  in the last 168 hours  CBG:  Recent Labs Lab 04/20/14 1047 04/20/14 1559 04/20/14 2142 04/21/14 0831  GLUCAP 264* 358* 242* 272*    Microbiology: Results for orders placed  during the hospital encounter of 04/14/14  CULTURE, BLOOD (ROUTINE X 2)     Status: None   Collection Time    04/14/14  6:59 AM      Result Value Ref Range Status   Specimen Description BLOOD RIGHT FOREARM   Final   Special Requests BOTTLES DRAWN AEROBIC AND ANAEROBIC 5ML   Final   Culture  Setup Time     Final   Value: 04/14/2014 11:11     Performed at [place]     Final  Value: NO GROWTH 5 DAYS     Performed at [facility]   Report Status 04/20/2014 FINAL   Final  CULTURE, BLOOD (ROUTINE X 2)     Status: None   Collection Time    04/14/14  7:36 AM      Result Value Ref Range Status   Specimen Description BLOOD LEFT ANTECUBITAL   Final   Special Requests BOTTLES DRAWN AEROBIC AND ANAEROBIC 5ML   Final   Culture  Setup Time     Final   Value: 04/14/2014 11:12     Performed at [facility]   Culture     Final   Value: PROTEUS MIRABILIS     ESCHERICHIA COLI     Note: Gram Stain Report Called to,Read Back By and Verified With: [name] ON 04/14/2014 AT 11:23P BY [name]     Performed at [facility]   Report Status 04/17/2014 FINAL   Final   Organism ID, Bacteria PROTEUS MIRABILIS   Final   Organism ID, Bacteria ESCHERICHIA COLI   Final  URINE CULTURE     Status: None   Collection Time    04/14/14  7:59 AM      Result Value Ref Range Status   Specimen Description IN/OUT CATH URINE   Final   Special Requests NONE   Final   Culture  Setup Time     Final   Value: 04/14/2014 11:25     Performed at [place]     Final   Value: >=100,000 COLONIES/ML     Performed at [facility]   Culture     Final   Value: [place]     Performed at [facility]   Report Status 04/18/2014 FINAL   Final   Organism ID, Bacteria ESCHERICHIA COLI   Final   Organism ID, Bacteria PROTEUS MIRABILIS   Final  CULTURE, BLOOD (ROUTINE X 2)     Status: None   Collection Time     04/16/14  2:00 PM      Result Value Ref Range Status   Specimen Description BLOOD LEFT ARM   Final   Special Requests BOTTLES DRAWN AEROBIC AND ANAEROBIC  10CC   Final   Culture  Setup Time     Final   Value: 04/16/2014 17:15     Performed at [facility]   Culture     Final   Value:        BLOOD CULTURE RECEIVED NO GROWTH TO DATE CULTURE WILL BE HELD FOR 5 DAYS BEFORE ISSUING A FINAL NEGATIVE REPORT     Performed at [facility]   Report Status PENDING   Incomplete  CULTURE, BLOOD (ROUTINE X 2)     Status: None   Collection Time    04/16/14  2:12 PM      Result Value Ref Range Status   Specimen Description BLOOD LEFT HAND   Final   Special Requests BOTTLES DRAWN AEROBIC ONLY  5CC    Final   Culture  Setup Time     Final   Value: 04/16/2014 17:16     Performed at [facility]   Culture     Final   Value:        BLOOD CULTURE RECEIVED NO GROWTH TO DATE CULTURE WILL BE HELD FOR 5 DAYS BEFORE ISSUING A FINAL NEGATIVE REPORT     Performed  at [facility]   Report Status PENDING   Incomplete  CLOSTRIDIUM DIFFICILE BY PCR     Status: None   Collection Time    04/19/14  6:42 PM      Result Value Ref Range Status   C difficile by pcr NEGATIVE  NEGATIVE Final   Comment: Performed at [facility]  MRSA PCR SCREENING     Status: None   Collection Time    04/20/14  2:38 PM      Result Value Ref Range Status   MRSA by PCR NEGATIVE  NEGATIVE Final   Comment:            The GeneXpert MRSA Assay (FDA     approved for NASAL specimens     only), is one component of a     comprehensive MRSA colonization     surveillance program. It is not     intended to diagnose MRSA     infection nor to guide or     monitor treatment for     MRSA infections.    Coagulation Studies: No results found for this basename: LABPROT, INR,  in the last 72 hours  Imaging: Ct Head Wo Contrast  04/20/2014   CLINICAL DATA:  Altered mental status.  EXAM: CT HEAD WITHOUT  CONTRAST  TECHNIQUE: Contiguous axial images were obtained from the base of the skull through the vertex without intravenous contrast.  COMPARISON:  Head CT scan 06/22/2011.  Brain MRI 06/30/2011.  FINDINGS: There is no evidence of acute intracranial abnormality including infarct, hemorrhage, mass lesion, mass effect or abnormal extra-axial fluid collection. No hydrocephalus or pneumocephalus. The calvarium is intact. Imaged paranasal sinuses and mastoid air cells are clear.  IMPRESSION: Negative head CT.   Electronically Signed   By: Inge Rise M.D.   On: 04/20/2014 11:31       Assessment and plan discussed with with attending physician and they are in agreement.    Etta Quill PA-C [facility] [phone]  04/21/2014, 9:21 AM   Assessment: 63 y.o. male with three episodes of transient speech arrest. One episode while hypertensive and in Afib.  patient has had no further episodes today and in NSR. CT head obtained showed no acute infarct or bleed. Exam is non focal at this time. Differential includes TIA versus simple partial seizure. Will further evaluate with MRI and EEG. With preserved memory and temporal correlation to afib, I would favor TIA as an etiology. It is possible he had a subocclusive thrombus that has since stuttered, then resolved.   Stroke Risk Factors - diabetes mellitus and hypertension  1) LDL, A1c 2) Echo 3) EEG 4) heparin, can change to longer acting agent if MRI is negative for stroke 5) will continue to follow.    Roland Rack, MD [facility] [phone]  If 7pm- 7am, please page neurology on call as listed in [place].

## 2014-04-21 NOTE — Progress Notes (Signed)
TELEMETRY: Reviewed telemetry pt in NSR: Filed Vitals:   04/21/14 0300 04/21/14 0400 04/21/14 0500 04/21/14 0600  BP: 141/68 145/73 149/81 135/77  Pulse: 73 74 73 71  Temp:  97.3 F (36.3 C)    TempSrc:  Oral    Resp: 19 21 17 20   Height:      Weight:   171 lb 1.2 oz (77.6 kg)   SpO2: 92% 95% 94% 92%    Intake/Output Summary (Last 24 hours) at 04/21/14 0727 Last data filed at 04/21/14 0600  Gross per 24 hour  Intake   1175 ml  Output      0 ml  Net   1175 ml   Filed Weights   04/20/14 0443 04/20/14 1400 04/21/14 0500  Weight: 167 lb 8 oz (75.978 kg) 171 lb 1.2 oz (77.6 kg) 171 lb 1.2 oz (77.6 kg)    Subjective Feels great today. No chest pain or SOB.   Marland Kitchen budesonide (PULMICORT) nebulizer solution  0.25 mg Nebulization BID  . clopidogrel  75 mg Oral Daily  . diltiazem  240 mg Oral Daily  . feeding supplement (ENSURE COMPLETE)  237 mL Oral TID BM  . folic acid  1 mg Oral Daily  . furosemide  20 mg Oral Daily  . heparin subcutaneous  5,000 Units Subcutaneous 3 times per day  . insulin aspart  0-15 Units Subcutaneous TID WC  . isosorbide-hydrALAZINE  1 tablet Oral BID  . levofloxacin  750 mg Oral Q24H  . multivitamin with minerals  1 tablet Oral Daily  . nicotine  14 mg Transdermal Daily  . nystatin  5 mL Oral QID  . potassium chloride  40 mEq Oral Daily  . saccharomyces boulardii  250 mg Oral BID  . sodium chloride  3 mL Intravenous Q12H  . tamsulosin  0.4 mg Oral QPC supper  . thiamine  100 mg Oral Daily   . labetalol (NORMODYNE) infusion 2.5 mg/min (04/21/14 0600)    LABS: Basic Metabolic Panel:  Recent Labs  04/20/14 0442  NA 131*  K 3.4*  CL 90*  CO2 26  GLUCOSE 212*  BUN 28*  CREATININE 1.15  CALCIUM 8.8   Liver Function Tests: No results found for this basename: AST, ALT, ALKPHOS, BILITOT, PROT, ALBUMIN,  in the last 72 hours No results found for this basename: LIPASE, AMYLASE,  in the last 72 hours CBC:  Recent Labs  04/20/14 0442  WBC  10.1  HGB 14.6  HCT 39.9  MCV 95.2  PLT 233   Cardiac Enzymes: No results found for this basename: CKTOTAL, CKMB, CKMBINDEX, TROPONINI,  in the last 72 hours BNP: No results found for this basename: PROBNP,  in the last 72 hours D-Dimer: No results found for this basename: DDIMER,  in the last 72 hours Hemoglobin A1C:  Recent Labs  04/20/14 1413  HGBA1C 6.6*   Fasting Lipid Panel: No results found for this basename: CHOL, HDL, LDLCALC, TRIG, CHOLHDL, LDLDIRECT,  in the last 72 hours Thyroid Function Tests: No results found for this basename: TSH, T4TOTAL, FREET3, T3FREE, THYROIDAB,  in the last 72 hours   Radiology/Studies:  Ct Head Wo Contrast  04/20/2014   CLINICAL DATA:  Altered mental status.  EXAM: CT HEAD WITHOUT CONTRAST  TECHNIQUE: Contiguous axial images were obtained from the base of the skull through the vertex without intravenous contrast.  COMPARISON:  Head CT scan 06/22/2011.  Brain MRI 06/30/2011.  FINDINGS: There is no evidence of acute intracranial abnormality  including infarct, hemorrhage, mass lesion, mass effect or abnormal extra-axial fluid collection. No hydrocephalus or pneumocephalus. The calvarium is intact. Imaged paranasal sinuses and mastoid air cells are clear.  IMPRESSION: Negative head CT.   Electronically Signed   By: Inge Rise M.D.   On: 04/20/2014 11:31   Echo:Study Conclusions  - Left ventricle: The cavity size was normal. Wall thickness was increased in a pattern of mild LVH. Systolic function was normal. The estimated ejection fraction was in the range of 50% to 55%. Regional wall motion abnormalities cannot be excluded. - Left atrium: The atrium was moderately dilated. - Right atrium: The atrium was moderately dilated  PHYSICAL EXAM General: Well developed, well nourished, in no acute distress. Head: Normocephalic, atraumatic, sclera non-icteric, oropharynx is clear Neck: Negative for carotid bruits. JVD not elevated. No  adenopathy Lungs: Clear bilaterally to auscultation without wheezes, rales, or rhonchi. Breathing is unlabored. Heart: RRR S1 S2 without murmurs, rubs, or gallops.  Abdomen: Soft, non-tender, non-distended with normoactive bowel sounds. No hepatomegaly. No rebound/guarding. No obvious abdominal masses. Extremities: No clubbing, cyanosis or edema.  Distal pedal pulses are 2+ and equal bilaterally. Neuro: Alert and oriented X 3. Moves all extremities spontaneously. Psych:  Responds to questions appropriately with a normal affect.  ASSESSMENT AND PLAN: 1. Atrial fibrillation. Converted spontaneously to NSR. Moderate biatrial enlargement. Afib related to severe HTN, URI, UTI, and Etoh use. Continue diltiazem. Switch labetolol to po. Discussed importance of Etoh abstinence. CHA2DS2-Vasc score 3-5 (CHF, HTN, DM +/- TIA), may need systemic anticoagulation. Surgery planned in near future. Would hold off on anticoagulation until after surgery if he is able to maintain NSR now. 2. URI  3. Acute diastolic CHF 2/2 infection, HTN and a-fib  - Echo preserved EF 50-55%. LA size 4.9 cm  4. Hypertension: Control improved. Will try and transition to oral labetolol. 5. history of prostate cancer status post prostatectomy  6. newly diagnosed colovesicular fistula  - seen by surgery, no surgical intervention during this admission. Will need colonscopy later before attempt surgical intervention  7. Diabetes  8. alcohol abuse: discussed with cessation as it increase PAF  9. tobacco abuse  10. TIA: ?delirium  - CT of head negative, plavix added as patient allergic to NSAIDs  11. Leukocytosis: WBC peaked 22, returned to normal after abx     Present on Admission:  . Sepsis . Urinary tract infection . Alcohol abuse . HTN (hypertension) . Hyponatremia . Tobacco abuse . Colovesical fistula . Elevated bilirubin . Sepsis due to urinary tract infection  Signed, Ephriam Turman [place], [place] 04/21/2014 7:27  AM

## 2014-04-21 NOTE — Progress Notes (Signed)
Inpatient Diabetes Program Recommendations  AACE/ADA: New Consensus Statement on Inpatient Glycemic Control (2013)  Target Ranges:  Prepandial:   less than 140 mg/dL      Peak postprandial:   less than 180 mg/dL (1-2 hours)      Critically ill patients:  140 - 180 mg/dL   Reason for Visit: Hyperglycemia  Results for Douglas Ballard, Douglas Ballard (MRN [id number]) as of 04/21/2014 16:44  Ref. Range 04/20/2014 10:47 04/20/2014 15:59 04/20/2014 21:42 04/21/2014 08:31 04/21/2014 12:08  Glucose-Capillary Latest Range: 70-99 mg/dL 264 (H) 358 (H) 242 (H) 272 (H) 263 (H)   Hyperglycemia with blood sugars >250 mg/dL. Needs insulin adjustment.  Recommendations: Add low dose basal insulin - Lantus 8 units QHS May benefit from addition of meal coverage insulin with supplement intake and sporadic intake of meals Please add CHO mod med to heart healthy diet.  Will continue to follow. Thank you. Lorenda Peck, RD, LDN, CDE Inpatient Diabetes Coordinator [phone]

## 2014-04-21 NOTE — Progress Notes (Signed)
NUTRITION FOLLOW UP  Intervention:   - Encouraged continued excellent meal and supplement intake - RD to monitor intake   Nutrition Dx:   Inadequate oral intake related to medical status as evidenced by report. -resolved   Goal:   Intake of meals and supplements to meet >90% estimated needs.-met   Monitor:   Weights, labs, intake  Assessment:   Patient admitted with Urosepsis and ARF. Newly diagnosed colovesicular fistula. Sodium 119 secondary to ETOH abuse and dehydration per MD. Plan is to allow patient to recover from urosepsis and stabilize medical issues then plan for colonoscopy and surgery the following day with p to avoid temporary colostomy. Currently with pulmonary edema, possible CHF vs vol overload related to CKD and needs aggressive diuresis.   8/28:  -Patient is on a full liquid diet with very poor intake.  -Strict fluid restriction needed.  -Patient reports eating 2-2 1/2 meals daily until 1-2 days prior to admit. Wife had been giving him Ensure.  -Wife's niece is a RD in [place].  -Weight has been stable per patient.   8/30:  -Soft diet, tolerating well with 50-100% intake,  -Ready for Ensure  -Oral carditis but with no pain with eating  -diarrhea probably secondary to antibiotics, florastor added   9/2: - Rapid response called yesterday for neuro event, "Pt standing at desk with stare on face, not responding to commands. Nonverbal. Able to look at RN in the face but unable to make speech. Episode lasted 4-5 minutes and then resolved." - Event occurred again yesterday, also lasted about 4 minutes  - Met with pt and wife who report pt with great appetite and intake, averaging about 90% meal completion and 100% Ensure Complete completion  - No diarrhea reported today  Potassium slightly low, getting oral replacement   Height: Ht Readings from Last 1 Encounters:  04/20/14 _0  (1.753 m)    Weight Status:   Wt Readings from Last 1 Encounters:   04/21/14 171 lb 1.2 oz (77.6 kg)  Admit wt            Re-estimated needs:  Kcal: 2000-2100  Protein: 90-100 gm  Fluid: strict fluid restriction   Skin: intact  Diet Order: Cardiac   Intake/Output Summary (Last 24 hours) at 04/21/14 0928 Last data filed at 04/21/14 0800  Gross per 24 hour  Intake   1085 ml  Output      0 ml  Net   1085 ml    Last BM: 9/1   Labs:   Recent Labs Lab 04/17/14 0404 04/18/14 0507 04/20/14 0442  NA 127* 131* 131*  K 3.6* 3.9 3.4*  CL 90* 95* 90*  CO2 18* 22 26  BUN 42* 35* 28*  CREATININE 1.90* 1.45* 1.15  CALCIUM 7.9* 8.5 8.8  GLUCOSE 131* 167* 212*    CBG (last 3)   Recent Labs  04/20/14 1559 04/20/14 2142 04/21/14 0831  GLUCAP 358* 242* 272*    Scheduled Meds: . budesonide (PULMICORT) nebulizer solution  0.25 mg Nebulization BID  . clopidogrel  75 mg Oral Daily  . diltiazem  240 mg Oral Daily  . feeding supplement (ENSURE COMPLETE)  237 mL Oral TID BM  . folic acid  1 mg Oral Daily  . furosemide  20 mg Oral Daily  . heparin subcutaneous  5,000 Units Subcutaneous 3 times per day  . insulin aspart  0-15 Units Subcutaneous TID WC  . isosorbide-hydrALAZINE  1 tablet Oral BID  . labetalol  200 mg Oral TID  . levofloxacin  750 mg Oral Q24H  . multivitamin with minerals  1 tablet Oral Daily  . nicotine  14 mg Transdermal Daily  . nystatin  5 mL Oral QID  . potassium chloride  40 mEq Oral Daily  . saccharomyces boulardii  250 mg Oral BID  . sodium chloride  3 mL Intravenous Q12H  . tamsulosin  0.4 mg Oral QPC supper  . thiamine  100 mg Oral Daily    Continuous Infusions: . labetalol (NORMODYNE) infusion Stopped (04/21/14 0838)    Carlis Stable MS, RD, LDN [phone] Pager [phone] Weekend/After Hours Pager

## 2014-04-21 NOTE — Progress Notes (Signed)
PHARMACIST - PHYSICIAN COMMUNICATION CONCERNING:  IV Heparin  Please see pharmacy note written earlier today for full details.  1800 HL is undetectable on infusion of 850 units/hr.  No issues per RN.   RECOMMENDATION: Increase heparin infusion to 1000 units/hr, no bolus d/t stroke protocol.  Note lower heparin goal (0.3-0.5).  Recheck HL in 6 hours.    Ralene Bathe, PharmD, BCPS 04/21/2014, 7:32 PM  Pager: [phone]

## 2014-04-22 LAB — BASIC METABOLIC PANEL
ANION GAP: 12 (ref 5–15)
BUN: 26 mg/dL — ABNORMAL HIGH (ref 6–23)
CALCIUM: 8.5 mg/dL (ref 8.4–10.5)
CO2: 24 mEq/L (ref 19–32)
Chloride: 94 mEq/L — ABNORMAL LOW (ref 96–112)
Creatinine, Ser: 1.51 mg/dL — ABNORMAL HIGH (ref 0.50–1.35)
GFR calc Af Amer: 55 mL/min — ABNORMAL LOW (ref >90)
GFR, EST NON AFRICAN AMERICAN: 48 mL/min — AB (ref >90)
Glucose, Bld: 180 mg/dL — ABNORMAL HIGH (ref 70–99)
Potassium: 4.1 mEq/L (ref 3.7–5.3)
SODIUM: 130 meq/L — AB (ref 137–147)

## 2014-04-22 LAB — CBC
HCT: 37.1 % — ABNORMAL LOW (ref 39.0–52.0)
Hemoglobin: 13.3 g/dL (ref 13.0–17.0)
MCH: 34.2 pg — ABNORMAL HIGH (ref 26.0–34.0)
MCHC: 35.8 g/dL (ref 30.0–36.0)
MCV: 95.4 fL (ref 78.0–100.0)
Platelets: 287 10*3/uL (ref 150–400)
RBC: 3.89 MIL/uL — ABNORMAL LOW (ref 4.22–5.81)
RDW: 12.1 % (ref 11.5–15.5)
WBC: 9.2 10*3/uL (ref 4.0–10.5)

## 2014-04-22 LAB — GLUCOSE, CAPILLARY
GLUCOSE-CAPILLARY: 207 mg/dL — AB (ref 70–99)
GLUCOSE-CAPILLARY: 236 mg/dL — AB (ref 70–99)
Glucose-Capillary: 170 mg/dL — ABNORMAL HIGH (ref 70–99)
Glucose-Capillary: 190 mg/dL — ABNORMAL HIGH (ref 70–99)
Glucose-Capillary: 177 mg/dL — ABNORMAL HIGH (ref 70–99)

## 2014-04-22 LAB — LIPID PANEL
Cholesterol: 81 mg/dL (ref 0–200)
HDL: 41 mg/dL (ref >39)
LDL Cholesterol: 30 mg/dL (ref 0–99)
TRIGLYCERIDES: 52 mg/dL (ref <150)
Total CHOL/HDL Ratio: 2 RATIO
VLDL: 10 mg/dL (ref 0–40)

## 2014-04-22 LAB — CULTURE, BLOOD (ROUTINE X 2)
CULTURE: NO GROWTH
Culture: NO GROWTH

## 2014-04-22 LAB — HEPARIN LEVEL (UNFRACTIONATED): Heparin Unfractionated: <0.1 [IU]/mL — ABNORMAL LOW (ref 0.30–0.70)

## 2014-04-22 MED ORDER — LABETALOL HCL 300 MG PO TABS
300.0000 mg | ORAL_TABLET | Freq: Three times a day (TID) | ORAL | Status: DC
  Administered 2014-04-22 – 2014-04-23 (×4): 300 mg via ORAL
  Filled 2014-04-22 (×6): qty 1

## 2014-04-22 MED ORDER — HEPARIN (PORCINE) IN NACL 100-0.45 UNIT/ML-% IJ SOLN
1300.0000 [IU]/h | INTRAMUSCULAR | Status: DC
  Administered 2014-04-22: 1300 [IU]/h via INTRAVENOUS
  Filled 2014-04-22 (×2): qty 250

## 2014-04-22 MED ORDER — APIXABAN 5 MG PO TABS
5.0000 mg | ORAL_TABLET | Freq: Two times a day (BID) | ORAL | Status: DC
  Administered 2014-04-22 – 2014-04-23 (×3): 5 mg via ORAL
  Filled 2014-04-22 (×4): qty 1

## 2014-04-22 MED ORDER — FUROSEMIDE 20 MG PO TABS
20.0000 mg | ORAL_TABLET | Freq: Every day | ORAL | Status: DC
  Administered 2014-04-23: 20 mg via ORAL
  Filled 2014-04-22 (×2): qty 1

## 2014-04-22 NOTE — Progress Notes (Signed)
ANTICOAGULATION CONSULT NOTE - Initial Consult  Pharmacy Consult for Apixaban Indication: atrial fibrillation  Allergies  Allergen Reactions  . Nsaids     Kidney disease/failure  . Sulfa Antibiotics Other (See Comments)    headaches    Patient Measurements: Height: 5\' 9"  (175.3 cm) Weight: 168 lb 11.2 oz (76.522 kg) IBW/kg (Calculated) : 70.7  Vital Signs: Temp: 98.3 F (36.8 C) (09/03 0545) Temp src: Oral (09/03 0545) BP: 167/88 mmHg (09/03 1103) Pulse Rate: 73 (09/03 0545)  Labs:  Recent Labs  04/20/14 0442 04/21/14 0932 04/21/14 1125 04/21/14 1800 04/22/14 0141 04/22/14 0545  HGB 14.6  --   --   --  13.3  --   HCT 39.9  --   --   --  37.1*  --   PLT 233  --   --   --  287  --   APTT  --   --  36  --   --   --   LABPROT  --   --  15.3*  --   --   --   INR  --   --  1.21  --   --   --   HEPARINUNFRC  --   --   --  <0.10*  --  <0.10*  CREATININE 1.15 1.38*  --   --  1.51*  --     Estimated Creatinine Clearance: 50.7 ml/min (by C-G formula based on Cr of 1.51).   Medical History: Past Medical History  Diagnosis Date  . Hypertension   . Cancer     Prostate surgery  . Headache(784.0)   . Pneumonia   . Diabetes mellitus   . 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   Marland Kitchen PNA (pneumonia) 06/26/2011  . Compression fracture of thoracic vertebra 06/26/2011  . Fistula, bladder     Medications:  Scheduled:  . budesonide (PULMICORT) nebulizer solution  0.25 mg Nebulization BID  . diltiazem  240 mg Oral Daily  . feeding supplement (ENSURE COMPLETE)  237 mL Oral BID BM  . folic acid  1 mg Oral Daily  . insulin aspart  0-15 Units Subcutaneous TID WC  . insulin aspart  0-5 Units Subcutaneous QHS  . insulin glargine  5 Units Subcutaneous QHS  . isosorbide-hydrALAZINE  1 tablet Oral BID  . labetalol  300 mg Oral  TID  . levofloxacin  750 mg Oral Q24H  . multivitamin with minerals  1 tablet Oral Daily  . nicotine  14 mg Transdermal Daily  . nystatin  5 mL Oral QID  . potassium chloride  40 mEq Oral Daily  . saccharomyces boulardii  250 mg Oral BID  . sodium chloride  3 mL Intravenous Q12H  . tamsulosin  0.4 mg Oral QPC supper  . thiamine  100 mg Oral Daily   Infusions:    Assessment: 63 y/o M with transient PAF and three recent episodes of transient speech arrest. Per neurology, differential diagnosis includes TIA vs simple partial seizure. Pharmacy consulted to begin IV heparin per stroke protocol (no bolus, low initial dose) on 9/2.  Subsequent CT and MRI brain negative for stroke, but pt noted to have recurrent afib.  Pharmacy consulted to dose apixaban from chronic anticoagulation.  Significant Events: 9/2 Heparin infusion started 9/3 Changed Heparin to apixaban   Heparin level: subtherapeutic this AM.  SCr elevated at 1.5, CrCl ~ 50 ml/min.  No need to dose reduce with age < 43 yrs and weight > 60 kg  CBC: Hgb and Plt WNL  DDI:  Diltiazem (moderate CYP3A4 Inhibitor) may increase apixaban concentrations, monitor therapy.  Concomitant Plavix d/c.  Goal of Therapy:  Stroke and systemic embolism prevention with atrial fibrillation   Plan:   D/C Heparin  Apixaban 5mg  PO BID  Follow up renal function and CBC  Patient education prior to discharge.  Gretta Arab PharmD, BCPS Pager [phone] 04/22/2014 11:40 AM

## 2014-04-22 NOTE — Progress Notes (Signed)
PROGRESS NOTE  Douglas Ballard [id number] DOB: Jul 31, 1951 DOA: 04/14/2014 PCP: Stephens Shire, MD  Assessment/Plan: sepsis  -present at time of admission  -due to UTI and bacteremia: E. 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  acute renal failure:  - secondary to UTI, NSAID's and continue use of avapro  -IVF's changed to [facility] due to vascular congestion, SOB and mild pulmonary edema  -avapro continue to be on hold  -follow renal function trend;  bacteremia  -Secondary to Escherichia coli and Proteus mirabilis  -Secondary to genitourinary source  -pan-sensitivity  -Continue levofloxacin as discussed above  - Surveillance blood cultures negative  Expressive aphasia--->TIA  -had transient, 3-5 min episode on 9/1  -MRI brain--neg for acute stroke, 71mm infundibulum vs small L-ICA supraclinoid aneurysm -CT brain neg  -appreciate neuro recs -start apixiban--case discussed with Dr. Leonel Ramsay Acute Diastolic CHF  -[id number] echocardiogram EF 50-55% -Daily weights -Fluid restrict -continue furosemide HTN:  -Continue holding avapro due to ARF  -lasix also helping with BP  -Do to new onset paroxysmal atrial fibrillation, Norvasc has been discontinue and antihypertensive drug use is a diltiazem with low-dose metoprolol, will also continue BiDil  -Increase labetalol per cardiology recommendations -Continue diltiazem hyponatremia:  -secondary to alcohol abuse and dehydration initially; then secondary to hypervolemic state  -mild improved with IVF's initially; with subsequent worsening renal function and development of vascular congestion/mild pulmonary edema  -Continue restricting IVF's to 1.5L daily  alcohol abuse with  delirium/withdrawal:  -CIWA protocol completed,  -Continue thiamine and folic acid  -cessation counseling provided  -Mentation is now back to baseline.  -Will use as needed Ativan for anxiety prn atrial fibrillation:  -Appears to be secondary to ongoing infection and withdrawal from alcohol abruptly cessation during hospitalization.  -Showed brief episode with conversion back to sinus rhythm-->back to Afib evening of 04/21/14 -continue diltiazem long acting  -switch to po labetolol per cardiology-->increase dose  -2-D echo has been done and demonstrated no wall motion abnormalities and has preserved ejection fraction. No further cardiac evaluation required at this moment acute resp failure with hypoxia:  -resolved -due to vascular congestion and pulmonary edema  -2-D echo with preserved EF and no WMA.uncontrolled hypertension and alcohol abuse. There is LVH appreciated on echo.  -as needed supplemental oxygen  -Continue PRN nebulizer treatment  -continue lasix, transitioned from by mouth 40 mg twice a day times one more day and after that, will resume 20 mg daily  -Low-sodium diet has been discussed with the patient along with fluid restriction (1.5 in 24 hours).  -d/c pulmicort BPH:  -will continue flomax  Diarrhea  - Clostridium difficile PCR  . tobacco abuse:  -cessation counseling provided   Code Status: Full  Family Communication: wife updated at bedside Disposition Plan: home when cleared by cardiology            Procedures/Studies: Dg Chest 2 View  04/18/2014   CLINICAL DATA:  63 year old male with pulmonary edema. Initial encounter.  EXAM: CHEST  2 VIEW  COMPARISON:  04/16/2014 and earlier.  FINDINGS: Interval regressed asymmetric bilateral peribronchovascular opacity. The findings are not completely resolved. Small bilateral pleural effusions. Stable cardiac size and mediastinal contours. Visualized tracheal air column is within normal limits. No pneumothorax. No  areas of worsening  ventilation. Chronic upper thoracic compression fracture. Stable visualized osseous structures.  IMPRESSION: Regressed bilateral pulmonary opacity compatible with decreasing asymmetric pulmonary edema. Small bilateral effusions.   Electronically Signed   By: Lars Pinks M.D.   On: 04/18/2014 08:20   Dg Chest 2 View  04/16/2014   CLINICAL DATA:  63 year old male with shortness of breath.  EXAM: CHEST  2 VIEW  COMPARISON:  04/14/2014 and prior chest radiographs dating back to 02/05/2007  FINDINGS: New bilateral central airspace opacities identified-likely moderate pulmonary edema.  Tiny bilateral pleural effusions are noted.  There is no evidence of pneumothorax or acute bony abnormality.  Upper limits normal heart size again noted.  IMPRESSION: New bilateral central airspace opacities likely representing moderate pulmonary edema. Tiny bilateral pleural effusions.   Electronically Signed   By: Hassan Rowan M.D.   On: 04/16/2014 08:43   Ct Head Wo Contrast  04/20/2014   CLINICAL DATA:  Altered mental status.  EXAM: CT HEAD WITHOUT CONTRAST  TECHNIQUE: Contiguous axial images were obtained from the base of the skull through the vertex without intravenous contrast.  COMPARISON:  Head CT scan 06/22/2011.  Brain MRI 06/30/2011.  FINDINGS: There is no evidence of acute intracranial abnormality including infarct, hemorrhage, mass lesion, mass effect or abnormal extra-axial fluid collection. No hydrocephalus or pneumocephalus. The calvarium is intact. Imaged paranasal sinuses and mastoid air cells are clear.  IMPRESSION: Negative head CT.   Electronically Signed   By: Inge Rise M.D.   On: 04/20/2014 11:31   Mr Jodene Nam Head Wo Contrast  04/21/2014   CLINICAL DATA:  Transient expressive aphasia. Altered mental status. Possible stroke.  EXAM: MRI HEAD WITHOUT CONTRAST  MRA HEAD WITHOUT CONTRAST  TECHNIQUE: Multiplanar, multiecho pulse sequences of the brain and surrounding structures were obtained without  intravenous contrast. Angiographic images of the head were obtained using MRA technique without contrast.  COMPARISON:  Head CT 04/20/2014 and MRI 06/30/2011  FINDINGS: MRI HEAD FINDINGS  There is no evidence of acute infarct, intracranial hemorrhage, mass, midline shift, or extra-axial fluid collection. There is mild generalized cerebral atrophy. Small foci of T2 hyperintensity in the subcortical and deep cerebral white matter have mildly progressed on the prior MRI and are nonspecific but compatible with mild chronic small vessel ischemic disease, mildly advanced for age. Patchy T2 hyperintensity in the pons does not appear significantly changed and may also reflect sequelae of chronic ischemia.  Orbits are unremarkable. Minimal bilateral ethmoid air cell and right maxillary sinus mucosal thickening is noted. Mastoid air cells are clear. Major intracranial vascular flow voids are preserved.  MRA HEAD FINDINGS  The visualized distal left vertebral artery is patent and dominant and supplies the basilar. Distal right vertebral artery appears small and ends in PICA. Left PICA appears patent, although its origin was incompletely imaged. Basilar artery is patent without stenosis. Right AICA and bilateral SCA origins are patent. PCAs are unremarkable.  Internal carotid arteries are patent from skullbase to carotid termini without stenosis. There is a 3 mm inferomedially directed outpouching from the left supraclinoid ICA in the expected region of the posterior communicating artery origin, although a posterior communicating artery is not clearly identified. ACAs and MCAs are unremarkable. Anterior communicating artery is patent.  IMPRESSION: 1. No acute intracranial abnormality. 2. Chronic small vessel ischemic disease, mildly progressed from prior MRI. 3. No major intracranial arterial occlusion or stenosis. 4. 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   Electronically Signed   By: Zenia Resides  Jeralyn Ruths   On: 04/21/2014 11:36   Mr Brain Wo Contrast  04/21/2014   CLINICAL DATA:  Transient expressive aphasia. Altered mental status. Possible stroke.  EXAM: MRI HEAD WITHOUT CONTRAST  MRA HEAD WITHOUT CONTRAST  TECHNIQUE: Multiplanar, multiecho pulse sequences of the brain and surrounding structures were obtained without intravenous contrast. Angiographic images of the head were obtained using MRA technique without contrast.  COMPARISON:  Head CT 04/20/2014 and MRI 06/30/2011  FINDINGS: MRI HEAD FINDINGS  There is no evidence of acute infarct, intracranial hemorrhage, mass, midline shift, or extra-axial fluid collection. There is mild generalized cerebral atrophy. Small foci of T2 hyperintensity in the subcortical and deep cerebral white matter have mildly progressed on the prior MRI and are nonspecific but compatible with mild chronic small vessel ischemic disease, mildly advanced for age. Patchy T2 hyperintensity in the pons does not appear significantly changed and may also reflect sequelae of chronic ischemia.  Orbits are unremarkable. Minimal bilateral ethmoid air cell and right maxillary sinus mucosal thickening is noted. Mastoid air cells are clear. Major intracranial vascular flow voids are preserved.  MRA HEAD FINDINGS  The visualized distal left vertebral artery is patent and dominant and supplies the basilar. Distal right vertebral artery appears small and ends in PICA. Left PICA appears patent, although its origin was incompletely imaged. Basilar artery is patent without stenosis. Right AICA and bilateral SCA origins are patent. PCAs are unremarkable.  Internal carotid arteries are patent from skullbase to carotid termini without stenosis. There is a 3 mm inferomedially directed outpouching from the left supraclinoid ICA in the expected region of the posterior communicating artery origin, although a posterior communicating artery is not clearly identified. ACAs and MCAs are unremarkable. Anterior  communicating artery is patent.  IMPRESSION: 1. No acute intracranial abnormality. 2. Chronic small vessel ischemic disease, mildly progressed from prior MRI. 3. No major intracranial arterial occlusion or stenosis. 4. 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   Electronically Signed   By: Logan Bores   On: 04/21/2014 11:36   US Renal  04/14/2014   CLINICAL DATA:  63 year old male with left costovertebral angle pain and tenderness. Initial encounter. Urinary tract infection.  EXAM: RENAL/URINARY TRACT ULTRASOUND COMPLETE  COMPARISON:  [facility] CT Abdomen and Pelvis 02/24/2014.  FINDINGS: Right Kidney:  Length: 13.6 cm. Small area of upper pole scarring demonstrated on image 3 today, series 2, image 26 of the comparison. Otherwise echogenicity within normal limits. No mass or hydronephrosis visualized.  Left Kidney:  Length: 12.5 cm. Echogenicity within normal limits. No mass or hydronephrosis visualized.  Bladder:  Decompressed, not visible.  IMPRESSION: Negative sonographic appearance of the kidneys. Decompressed bladder.   Electronically Signed   By: Lars Pinks M.D.   On: 04/14/2014 10:11   Dg Chest Port 1 View  04/14/2014   CLINICAL DATA:  Fever, sepsis  EXAM: PORTABLE CHEST - 1 VIEW  COMPARISON:  06/28/2011  FINDINGS: Cardiomegaly with pulmonary vascular congestion and possible mild interstitial edema. Mild bilateral lower lobe opacities, likely atelectasis. No focal consolidation. No pleural effusion or pneumothorax.  IMPRESSION: Screening loop pulmonary vascular congestion and possible mild interstitial edema.  Mild patchy bilateral lower lobe opacities, likely atelectasis.   Electronically Signed   By: Julian Hy M.D.   On: 04/14/2014 08:34         Subjective: Patient denies fevers, chills, headache, chest pain, dyspnea, nausea, vomiting, diarrhea, abdominal pain, dysuria, hematuria   Objective: Filed Vitals:  04/21/14 2129 04/22/14  0545 04/22/14 1103 04/22/14 1432  BP: 163/77 173/79 167/88 160/85  Pulse: 73 73  71  Temp: 99.2 F (37.3 C) 98.3 F (36.8 C)  98.1 F (36.7 C)  TempSrc: Oral Oral  Oral  Resp: 18 18  18   Height:      Weight:  76.522 kg (168 lb 11.2 oz)    SpO2: 97% 95%  100%    Intake/Output Summary (Last 24 hours) at 04/22/14 1556 Last data filed at 04/22/14 0700  Gross per 24 hour  Intake  772.5 ml  Output    200 ml  Net  572.5 ml   Weight change: -1.078 kg (-2 lb 6 oz) Exam:   General:  Pt is alert, follows commands appropriately, not in acute distress  HEENT: No icterus, No thrush, [place]/AT  Cardiovascular: RRR, S1/S2, no rubs, no gallops  Respiratory: Bibasilar crackles. No wheezing. Good air movement.  Abdomen: Soft/+BS, non tender, non distended, no guarding  Extremities: trace LE edema, No lymphangitis, No petechiae, No rashes, no synovitis  Data Reviewed: Basic Metabolic Panel:  Recent Labs Lab 04/17/14 0404 04/18/14 0507 04/20/14 0442 04/21/14 0932 04/22/14 0141  NA 127* 131* 131* 126* 130*  K 3.6* 3.9 3.4* 3.6* 4.1  CL 90* 95* 90* 87* 94*  CO2 18* 22 26 25 24   GLUCOSE 131* 167* 212* 277* 180*  BUN 42* 35* 28* 26* 26*  CREATININE 1.90* 1.45* 1.15 1.38* 1.51*  CALCIUM 7.9* 8.5 8.8 8.3* 8.5   Liver Function Tests: No results found for this basename: AST, ALT, ALKPHOS, BILITOT, PROT, ALBUMIN,  in the last 168 hours No results found for this basename: LIPASE, AMYLASE,  in the last 168 hours No results found for this basename: AMMONIA,  in the last 168 hours CBC:  Recent Labs Lab 04/16/14 0450 04/18/14 0507 04/20/14 0442 04/22/14 0141  WBC 17.9* 9.7 10.1 9.2  HGB 13.7 13.8 14.6 13.3  HCT 37.7* 38.0* 39.9 37.1*  MCV 96.4 93.8 95.2 95.4  PLT 119* 168 233 287   Cardiac Enzymes: No results found for this basename: CKTOTAL, CKMB, CKMBINDEX, TROPONINI,  in the last 168 hours BNP: No components found with this basename: POCBNP,  CBG:  Recent Labs Lab  04/21/14 1825 04/21/14 2131 04/22/14 0739 04/22/14 0807 04/22/14 1148  GLUCAP 203* 219* 170* 190* 207*    Recent Results (from the past 240 hour(s))  CULTURE, BLOOD (ROUTINE X 2)     Status: None   Collection Time    04/14/14  6:59 AM      Result Value Ref Range Status   Specimen Description BLOOD RIGHT FOREARM   Final   Special Requests BOTTLES DRAWN AEROBIC AND ANAEROBIC 5ML   Final   Culture  Setup Time     Final   Value: 04/14/2014 11:11     Performed at [place]     Final   Value: NO GROWTH 5 DAYS     Performed at [facility]   Report Status 04/20/2014 FINAL   Final  CULTURE, BLOOD (ROUTINE X 2)     Status: None   Collection Time    04/14/14  7:36 AM      Result Value Ref Range Status   Specimen Description BLOOD LEFT ANTECUBITAL   Final   Special Requests BOTTLES DRAWN AEROBIC AND ANAEROBIC 5ML   Final   Culture  Setup Time     Final   Value: 04/14/2014 11:12  Performed at [facility]     Final   Value: PROTEUS MIRABILIS     ESCHERICHIA COLI     Note: Gram Stain Report Called to,Read Back By and Verified With: [name] ON 04/14/2014 AT 11:23P BY [name]     Performed at [facility]   Report Status 04/17/2014 FINAL   Final   Organism ID, Bacteria PROTEUS MIRABILIS   Final   Organism ID, Bacteria ESCHERICHIA COLI   Final  URINE CULTURE     Status: None   Collection Time    04/14/14  7:59 AM      Result Value Ref Range Status   Specimen Description IN/OUT CATH URINE   Final   Special Requests NONE   Final   Culture  Setup Time     Final   Value: 04/14/2014 11:25     Performed at [place]     Final   Value: >=100,000 COLONIES/ML     Performed at [facility]   Culture     Final   Value: [place]     Performed at [facility]   Report Status 04/18/2014 FINAL   Final   Organism ID, Bacteria ESCHERICHIA COLI   Final    Organism ID, Bacteria PROTEUS MIRABILIS   Final  CULTURE, BLOOD (ROUTINE X 2)     Status: None   Collection Time    04/16/14  2:00 PM      Result Value Ref Range Status   Specimen Description BLOOD LEFT ARM   Final   Special Requests BOTTLES DRAWN AEROBIC AND ANAEROBIC  10CC   Final   Culture  Setup Time     Final   Value: 04/16/2014 17:15     Performed at [facility]   Culture     Final   Value: NO GROWTH 5 DAYS     Performed at [facility]   Report Status 04/22/2014 FINAL   Final  CULTURE, BLOOD (ROUTINE X 2)     Status: None   Collection Time    04/16/14  2:12 PM      Result Value Ref Range Status   Specimen Description BLOOD LEFT HAND   Final   Special Requests BOTTLES DRAWN AEROBIC ONLY  5CC    Final   Culture  Setup Time     Final   Value: 04/16/2014 17:16     Performed at [facility]   Culture     Final   Value: NO GROWTH 5 DAYS     Performed at [facility]   Report Status 04/22/2014 FINAL   Final  CLOSTRIDIUM DIFFICILE BY PCR     Status: None   Collection Time    04/19/14  6:42 PM      Result Value Ref Range Status   C difficile by pcr NEGATIVE  NEGATIVE Final   Comment: Performed at [facility]  MRSA PCR SCREENING     Status: None   Collection Time    04/20/14  2:38 PM      Result Value Ref Range Status   MRSA by PCR NEGATIVE  NEGATIVE Final   Comment:            The GeneXpert MRSA Assay (FDA     approved for NASAL specimens     only), is one component of a  comprehensive MRSA colonization     surveillance program. It is not     intended to diagnose MRSA     infection nor to guide or     monitor treatment for     MRSA infections.     Scheduled Meds: . apixaban  5 mg Oral BID  . budesonide (PULMICORT) nebulizer solution  0.25 mg Nebulization BID  . diltiazem  240 mg Oral Daily  . feeding supplement (ENSURE COMPLETE)  237 mL Oral BID BM  . folic acid  1 mg Oral Daily  . insulin aspart  0-15 Units  Subcutaneous TID WC  . insulin aspart  0-5 Units Subcutaneous QHS  . insulin glargine  5 Units Subcutaneous QHS  . isosorbide-hydrALAZINE  1 tablet Oral BID  . labetalol  300 mg Oral TID  . levofloxacin  750 mg Oral Q24H  . multivitamin with minerals  1 tablet Oral Daily  . nicotine  14 mg Transdermal Daily  . nystatin  5 mL Oral QID  . potassium chloride  40 mEq Oral Daily  . saccharomyces boulardii  250 mg Oral BID  . sodium chloride  3 mL Intravenous Q12H  . tamsulosin  0.4 mg Oral QPC supper  . thiamine  100 mg Oral Daily   Continuous Infusions:    Gwendolyne Welford, DO  [facility] Pager [phone]  If 7PM-7AM, please contact night-coverage [email or web address] Password [email or web address] 04/22/2014, 3:56 PM   LOS: 8 days

## 2014-04-22 NOTE — Progress Notes (Signed)
Subjective: Patient has had no further spell of transient expressive aphasia. He feels back to his baseline.   Objective: Current vital signs: BP 173/79  Pulse 73  Temp(Src) 98.3 F (36.8 C) (Oral)  Resp 18  Ht 5\' 9"  (1.753 m)  Wt 76.522 kg (168 lb 11.2 oz)  BMI 24.90 kg/m2  SpO2 95% Vital signs in last 24 hours: Temp:  [97.6 F (36.4 C)-99.2 F (37.3 C)] 98.3 F (36.8 C) (09/03 0545) Pulse Rate:  [61-73] 73 (09/03 0545) Resp:  [16-20] 18 (09/03 0545) BP: (114-173)/(65-82) 173/79 mmHg (09/03 0545) SpO2:  [95 %-100 %] 95 % (09/03 0545) Weight:  [76.522 kg (168 lb 11.2 oz)] 76.522 kg (168 lb 11.2 oz) (09/03 0545)  Intake/Output from previous day: 09/02 0701 - 09/03 0700 In: 1831.5 [P.O.:1500; I.V.:251.5] Out: 525 [Urine:525] Intake/Output this shift:   Nutritional status: Cardiac  Neurologic Exam: General: Mental Status: Alert, oriented, thought content appropriate.  Speech fluent without evidence of aphasia.  Able to follow 3 step commands without difficulty. Cranial Nerves: II: Discs flat bilaterally; Visual fields grossly normal, pupils equal, round, reactive to light and accommodation III,IV, VI: ptosis not present, extra-ocular motions intact bilaterally V,VII: smile symmetric, facial light touch sensation normal bilaterally VIII: hearing normal bilaterally IX,X: gag reflex present XI: bilateral shoulder shrug XII: midline tongue extension without atrophy or fasciculations  Motor: Right : Upper extremity   5/5    Left:     Upper extremity   5/5  Lower extremity   5/5     Lower extremity   5/5 Tone and bulk:normal tone throughout; no atrophy noted Sensory: Pinprick and light touch intact throughout, bilaterally Deep Tendon Reflexes:  Right: Upper Extremity   Left: Upper extremity   biceps (C-5 to C-6) 2/4   biceps (C-5 to C-6) 2/4 tricep (C7) 2/4    triceps (C7) 2/4 Brachioradialis (C6) 2/4  Brachioradialis (C6) 2/4  Lower Extremity Lower Extremity   quadriceps (L-2 to L-4) 2/4   quadriceps (L-2 to L-4) 2/4 Achilles (S1) 2/4   Achilles (S1) 2/4  Plantars: Right: downgoing   Left: downgoing    Lab Results: Basic Metabolic Panel:  Recent Labs Lab 04/17/14 0404 04/18/14 0507 04/20/14 0442 04/21/14 0932 04/22/14 0141  NA 127* 131* 131* 126* 130*  K 3.6* 3.9 3.4* 3.6* 4.1  CL 90* 95* 90* 87* 94*  CO2 18* 22 26 25 24   GLUCOSE 131* 167* 212* 277* 180*  BUN 42* 35* 28* 26* 26*  CREATININE 1.90* 1.45* 1.15 1.38* 1.51*  CALCIUM 7.9* 8.5 8.8 8.3* 8.5    Liver Function Tests: No results found for this basename: AST, ALT, ALKPHOS, BILITOT, PROT, ALBUMIN,  in the last 168 hours No results found for this basename: LIPASE, AMYLASE,  in the last 168 hours No results found for this basename: AMMONIA,  in the last 168 hours  CBC:  Recent Labs Lab 04/16/14 0450 04/18/14 0507 04/20/14 0442 04/22/14 0141  WBC 17.9* 9.7 10.1 9.2  HGB 13.7 13.8 14.6 13.3  HCT 37.7* 38.0* 39.9 37.1*  MCV 96.4 93.8 95.2 95.4  PLT 119* 168 233 287    Cardiac Enzymes: No results found for this basename: CKTOTAL, CKMB, CKMBINDEX, TROPONINI,  in the last 168 hours  Lipid Panel:  Recent Labs Lab 04/22/14 0141  CHOL 81  TRIG 52  HDL 41  CHOLHDL 2.0  VLDL 10  LDLCALC 30    CBG:  Recent Labs Lab 04/21/14 1208 04/21/14 1703 04/21/14 1825 04/21/14 2131 04/22/14  [place]* 170*    Microbiology: Results for orders placed during the hospital encounter of 04/14/14  CULTURE, BLOOD (ROUTINE X 2)     Status: None   Collection Time    04/14/14  6:59 AM      Result Value Ref Range Status   Specimen Description BLOOD RIGHT FOREARM   Final   Special Requests BOTTLES DRAWN AEROBIC AND ANAEROBIC 5ML   Final   Culture  Setup Time     Final   Value: 04/14/2014 11:11     Performed at [facility]   Culture     Final   Value: NO GROWTH 5 DAYS     Performed at [facility]   Report Status 04/20/2014  FINAL   Final  CULTURE, BLOOD (ROUTINE X 2)     Status: None   Collection Time    04/14/14  7:36 AM      Result Value Ref Range Status   Specimen Description BLOOD LEFT ANTECUBITAL   Final   Special Requests BOTTLES DRAWN AEROBIC AND ANAEROBIC 5ML   Final   Culture  Setup Time     Final   Value: 04/14/2014 11:12     Performed at [facility]   Culture     Final   Value: PROTEUS MIRABILIS     ESCHERICHIA COLI     Note: Gram Stain Report Called to,Read Back By and Verified With: [name] ON 04/14/2014 AT 11:23P BY [name]     Performed at [facility]   Report Status 04/17/2014 FINAL   Final   Organism ID, Bacteria PROTEUS MIRABILIS   Final   Organism ID, Bacteria ESCHERICHIA COLI   Final  URINE CULTURE     Status: None   Collection Time    04/14/14  7:59 AM      Result Value Ref Range Status   Specimen Description IN/OUT CATH URINE   Final   Special Requests NONE   Final   Culture  Setup Time     Final   Value: 04/14/2014 11:25     Performed at [facility]     Final   Value: >=100,000 COLONIES/ML     Performed at [facility]   Culture     Final   Value: [place]     Performed at [facility]   Report Status 04/18/2014 FINAL   Final   Organism ID, Bacteria ESCHERICHIA COLI   Final   Organism ID, Bacteria PROTEUS MIRABILIS   Final  CULTURE, BLOOD (ROUTINE X 2)     Status: None   Collection Time    04/16/14  2:00 PM      Result Value Ref Range Status   Specimen Description BLOOD LEFT ARM   Final   Special Requests BOTTLES DRAWN AEROBIC AND ANAEROBIC  10CC   Final   Culture  Setup Time     Final   Value: 04/16/2014 17:15     Performed at [facility]   Culture     Final   Value: NO GROWTH 5 DAYS     Performed at [facility]   Report Status 04/22/2014 FINAL   Final  CULTURE, BLOOD (ROUTINE X 2)     Status: None   Collection Time    04/16/14  2:12 PM      Result  Value Ref Range Status  Specimen Description BLOOD LEFT HAND   Final   Special Requests BOTTLES DRAWN AEROBIC ONLY  5CC    Final   Culture  Setup Time     Final   Value: 04/16/2014 17:16     Performed at [facility]   Culture     Final   Value: NO GROWTH 5 DAYS     Performed at [facility]   Report Status 04/22/2014 FINAL   Final  CLOSTRIDIUM DIFFICILE BY PCR     Status: None   Collection Time    04/19/14  6:42 PM      Result Value Ref Range Status   C difficile by pcr NEGATIVE  NEGATIVE Final   Comment: Performed at [facility]  MRSA PCR SCREENING     Status: None   Collection Time    04/20/14  2:38 PM      Result Value Ref Range Status   MRSA by PCR NEGATIVE  NEGATIVE Final   Comment:            The GeneXpert MRSA Assay (FDA     approved for NASAL specimens     only), is one component of a     comprehensive MRSA colonization     surveillance program. It is not     intended to diagnose MRSA     infection nor to guide or     monitor treatment for     MRSA infections.    Coagulation Studies:  Recent Labs  04/21/14 1125  LABPROT 15.3*  INR 1.21    Imaging: Ct Head Wo Contrast  04/20/2014   CLINICAL DATA:  Altered mental status.  EXAM: CT HEAD WITHOUT CONTRAST  TECHNIQUE: Contiguous axial images were obtained from the base of the skull through the vertex without intravenous contrast.  COMPARISON:  Head CT scan 06/22/2011.  Brain MRI 06/30/2011.  FINDINGS: There is no evidence of acute intracranial abnormality including infarct, hemorrhage, mass lesion, mass effect or abnormal extra-axial fluid collection. No hydrocephalus or pneumocephalus. The calvarium is intact. Imaged paranasal sinuses and mastoid air cells are clear.  IMPRESSION: Negative head CT.   Electronically Signed   By: Inge Rise M.D.   On: 04/20/2014 11:31   Mr Jodene Nam Head Wo Contrast  04/21/2014   CLINICAL DATA:  Transient expressive aphasia. Altered mental status. Possible  stroke.  EXAM: MRI HEAD WITHOUT CONTRAST  MRA HEAD WITHOUT CONTRAST  TECHNIQUE: Multiplanar, multiecho pulse sequences of the brain and surrounding structures were obtained without intravenous contrast. Angiographic images of the head were obtained using MRA technique without contrast.  COMPARISON:  Head CT 04/20/2014 and MRI 06/30/2011  FINDINGS: MRI HEAD FINDINGS  There is no evidence of acute infarct, intracranial hemorrhage, mass, midline shift, or extra-axial fluid collection. There is mild generalized cerebral atrophy. Small foci of T2 hyperintensity in the subcortical and deep cerebral white matter have mildly progressed on the prior MRI and are nonspecific but compatible with mild chronic small vessel ischemic disease, mildly advanced for age. Patchy T2 hyperintensity in the pons does not appear significantly changed and may also reflect sequelae of chronic ischemia.  Orbits are unremarkable. Minimal bilateral ethmoid air cell and right maxillary sinus mucosal thickening is noted. Mastoid air cells are clear. Major intracranial vascular flow voids are preserved.  MRA HEAD FINDINGS  The visualized distal left vertebral artery is patent and dominant and supplies the basilar. Distal right vertebral artery appears small and ends in PICA. Left PICA appears  patent, although its origin was incompletely imaged. Basilar artery is patent without stenosis. Right AICA and bilateral SCA origins are patent. PCAs are unremarkable.  Internal carotid arteries are patent from skullbase to carotid termini without stenosis. There is a 3 mm inferomedially directed outpouching from the left supraclinoid ICA in the expected region of the posterior communicating artery origin, although a posterior communicating artery is not clearly identified. ACAs and MCAs are unremarkable. Anterior communicating artery is patent.  IMPRESSION: 1. No acute intracranial abnormality. 2. Chronic small vessel ischemic disease, mildly progressed from  prior MRI. 3. No major intracranial arterial occlusion or stenosis. 4. 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   Electronically Signed   By: Logan Bores   On: 04/21/2014 11:36   Mr Brain Wo Contrast  04/21/2014   CLINICAL DATA:  Transient expressive aphasia. Altered mental status. Possible stroke.  EXAM: MRI HEAD WITHOUT CONTRAST  MRA HEAD WITHOUT CONTRAST  TECHNIQUE: Multiplanar, multiecho pulse sequences of the brain and surrounding structures were obtained without intravenous contrast. Angiographic images of the head were obtained using MRA technique without contrast.  COMPARISON:  Head CT 04/20/2014 and MRI 06/30/2011  FINDINGS: MRI HEAD FINDINGS  There is no evidence of acute infarct, intracranial hemorrhage, mass, midline shift, or extra-axial fluid collection. There is mild generalized cerebral atrophy. Small foci of T2 hyperintensity in the subcortical and deep cerebral white matter have mildly progressed on the prior MRI and are nonspecific but compatible with mild chronic small vessel ischemic disease, mildly advanced for age. Patchy T2 hyperintensity in the pons does not appear significantly changed and may also reflect sequelae of chronic ischemia.  Orbits are unremarkable. Minimal bilateral ethmoid air cell and right maxillary sinus mucosal thickening is noted. Mastoid air cells are clear. Major intracranial vascular flow voids are preserved.  MRA HEAD FINDINGS  The visualized distal left vertebral artery is patent and dominant and supplies the basilar. Distal right vertebral artery appears small and ends in PICA. Left PICA appears patent, although its origin was incompletely imaged. Basilar artery is patent without stenosis. Right AICA and bilateral SCA origins are patent. PCAs are unremarkable.  Internal carotid arteries are patent from skullbase to carotid termini without stenosis. There is a 3 mm inferomedially directed outpouching from the left supraclinoid ICA in  the expected region of the posterior communicating artery origin, although a posterior communicating artery is not clearly identified. ACAs and MCAs are unremarkable. Anterior communicating artery is patent.  IMPRESSION: 1. No acute intracranial abnormality. 2. Chronic small vessel ischemic disease, mildly progressed from prior MRI. 3. No major intracranial arterial occlusion or stenosis. 4. 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   Electronically Signed   By: Logan Bores   On: 04/21/2014 11:36    EEG: showed no epileptiform activity LDL 30 A1c 6.6  Study Conclusion for 2 De cho  - Left ventricle: The cavity size was normal. Wall thickness was increased in a pattern of mild LVH. Systolic function was normal. The estimated ejection fraction was in the range of 50% to 55%. Regional wall motion abnormalities cannot be excluded. - Left atrium: The atrium was moderately dilated. - Right atrium: The atrium was moderately dilated   Medications:  Scheduled: . budesonide (PULMICORT) nebulizer solution  0.25 mg Nebulization BID  . diltiazem  240 mg Oral Daily  . feeding supplement (ENSURE COMPLETE)  237 mL Oral BID BM  . folic acid  1 mg Oral  Daily  . furosemide  20 mg Oral Daily  . insulin aspart  0-15 Units Subcutaneous TID WC  . insulin aspart  0-5 Units Subcutaneous QHS  . insulin glargine  5 Units Subcutaneous QHS  . isosorbide-hydrALAZINE  1 tablet Oral BID  . labetalol  300 mg Oral TID  . levofloxacin  750 mg Oral Q24H  . multivitamin with minerals  1 tablet Oral Daily  . nicotine  14 mg Transdermal Daily  . nystatin  5 mL Oral QID  . potassium chloride  40 mEq Oral Daily  . saccharomyces boulardii  250 mg Oral BID  . sodium chloride  3 mL Intravenous Q12H  . tamsulosin  0.4 mg Oral QPC supper  . thiamine  100 mg Oral Daily    Assessment/Plan:  63 y.o. male with three episodes of transient speech arrest. One episode while hypertensive and in  Afib. patient has had no further episodes. CT and MRI brain negative for stroke.  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 EEG showed no epileptiform activity.  LDL 30 and A1c 6.6. Echo shows no thrombus.   Recommend: 1) [place] for patient to be placed on oral AC per Cardiology 2) Appointment has been made  With IR (Dr. Estanislado Pandy) for 04/29/2014 for 1300 hours 3) No further recommendations.     Etta Quill PA-C [facility] [phone]  04/22/2014, 9:12 AM

## 2014-04-22 NOTE — Progress Notes (Signed)
ANTIBIOTIC CONSULT NOTE - FOLLOW UP  Pharmacy Consult for Levaquin Indication: urosepsis/bacteremia  Allergies  Allergen Reactions  . Nsaids     Kidney disease/failure  . Sulfa Antibiotics Other (See Comments)    headaches    Patient Measurements: Height: 5\' 9"  (175.3 cm) Weight: 168 lb 11.2 oz (76.522 kg) IBW/kg (Calculated) : 70.7  Vital Signs: Temp: 98.3 F (36.8 C) (09/03 0545) Temp src: Oral (09/03 0545) BP: 167/88 mmHg (09/03 1103) Pulse Rate: 73 (09/03 0545) Intake/Output from previous day: 09/02 0701 - 09/03 0700 In: 1831.5 [P.O.:1500; I.V.:251.5] Out: 525 [Urine:525]  Labs:  Recent Labs  04/20/14 0442 04/21/14 0932 04/22/14 0141  WBC 10.1  --  9.2  HGB 14.6  --  13.3  PLT 233  --  287  CREATININE 1.15 1.38* 1.51*   Estimated Creatinine Clearance: 50.7 ml/min (by C-G formula based on Cr of 1.51).  Microbiology: 8/26 blood x2: 1/2 Proteus (pan-sens) and Ecoli (pan-sens), 1/2 ngtd 8/26 urine: >100,000 Proteus (pan-sens) and Ecoli (pan-sens) 8/28 blood x2: NGF 8/31 Cdiff PCR: neg 9/1 MRSA PCR: neg  Assessment: 1 yoM with colovesical fistula, planned for colonoscopy/surgery in several weeks admitted with urosepsis/bacteremia. Has been treated with IV abx and repeat blood cultures have been negative to date. MD now wishes to transition to PO Levaquin for 9 more days of treatment.  8/26 >> Rocephin >> 8/27, 8/28 >> 8/31 8/27 >> Zosyn >> 8/28 8/31 >> Levaquin >>  9/3: Day 9 total Abx, Day 4/9 Levaquin  Tmax: AF  WBCs: WNL (9/3)  Renal: AKI, Scr increased to 1.51 with CrCl ~ 50 ml/min.   Goal of Therapy:  Appropriate abx dosing, eradication of infection.   Plan:   Levaquin 750 mg PO daily x 9 days (last dose on 04/27/14)  Follow up renal function and cultures as available.  Gretta Arab PharmD, BCPS Pager [phone] 04/22/2014 1:52 PM

## 2014-04-22 NOTE — Progress Notes (Signed)
PHARMACIST - PHYSICIAN COMMUNICATION Rx Brief Anticoagulation note:  IV Heparin  Please see pharmacy note written 9/2 for full details.  Assessement:  0530 HL= <0.10 (second undetectable)   No IV infusion problems or bleeding noted by RN  Plan:  Increase heparin drip to 1300 units/hr (no bolus)  Recheck HL in 6 hours   Dorrene German  04/22/2014, 6:27 AM

## 2014-04-22 NOTE — Progress Notes (Signed)
TELEMETRY: Reviewed telemetry pt in NSR, was in AFib earlier this am 5 am.: Filed Vitals:   04/21/14 1934 04/21/14 2003 04/21/14 2129 04/22/14 0545  BP: 165/81  163/77 173/79  Pulse: 72  73 73  Temp:   99.2 F (37.3 C) 98.3 F (36.8 C)  TempSrc:   Oral Oral  Resp:   18 18  Height:      Weight:    168 lb 11.2 oz (76.522 kg)  SpO2: 100% 98% 97% 95%    Intake/Output Summary (Last 24 hours) at 04/22/14 0731 Last data filed at 04/22/14 0700  Gross per 24 hour  Intake 1831.5 ml  Output    525 ml  Net 1306.5 ml   Filed Weights   04/20/14 1400 04/21/14 0500 04/22/14 0545  Weight: 171 lb 1.2 oz (77.6 kg) 171 lb 1.2 oz (77.6 kg) 168 lb 11.2 oz (76.522 kg)    Subjective  No chest pain or SOB. Denies palpitations even in Afib. Feels tremulous.  . budesonide (PULMICORT) nebulizer solution  0.25 mg Nebulization BID  . diltiazem  240 mg Oral Daily  . feeding supplement (ENSURE COMPLETE)  237 mL Oral BID BM  . folic acid  1 mg Oral Daily  . furosemide  20 mg Oral Daily  . insulin aspart  0-15 Units Subcutaneous TID WC  . insulin aspart  0-5 Units Subcutaneous QHS  . insulin glargine  5 Units Subcutaneous QHS  . isosorbide-hydrALAZINE  1 tablet Oral BID  . labetalol  300 mg Oral TID  . levofloxacin  750 mg Oral Q24H  . multivitamin with minerals  1 tablet Oral Daily  . nicotine  14 mg Transdermal Daily  . nystatin  5 mL Oral QID  . potassium chloride  40 mEq Oral Daily  . saccharomyces boulardii  250 mg Oral BID  . sodium chloride  3 mL Intravenous Q12H  . tamsulosin  0.4 mg Oral QPC supper  . thiamine  100 mg Oral Daily   . heparin 1,300 Units/hr (04/22/14 0630)    LABS: Basic Metabolic Panel:  Recent Labs  04/21/14 0932 04/22/14 0141  NA 126* 130*  K 3.6* 4.1  CL 87* 94*  CO2 25 24  GLUCOSE 277* 180*  BUN 26* 26*  CREATININE 1.38* 1.51*  CALCIUM 8.3* 8.5   Liver Function Tests: No results found for this basename: AST, ALT, ALKPHOS, BILITOT, PROT, ALBUMIN,   in the last 72 hours No results found for this basename: LIPASE, AMYLASE,  in the last 72 hours CBC:  Recent Labs  04/20/14 0442 04/22/14 0141  WBC 10.1 9.2  HGB 14.6 13.3  HCT 39.9 37.1*  MCV 95.2 95.4  PLT 233 287   Cardiac Enzymes: No results found for this basename: CKTOTAL, CKMB, CKMBINDEX, TROPONINI,  in the last 72 hours BNP: No results found for this basename: PROBNP,  in the last 72 hours D-Dimer: No results found for this basename: DDIMER,  in the last 72 hours Hemoglobin A1C:  Recent Labs  04/20/14 1413  HGBA1C 6.6*   Fasting Lipid Panel:  Recent Labs  04/22/14 0141  CHOL 81  HDL 41  LDLCALC 30  TRIG 52  CHOLHDL 2.0   Thyroid Function Tests: No results found for this basename: TSH, T4TOTAL, FREET3, T3FREE, THYROIDAB,  in the last 72 hours   Radiology/Studies:  Ct Head Wo Contrast  04/20/2014   CLINICAL DATA:  Altered mental status.  EXAM: CT HEAD WITHOUT CONTRAST  TECHNIQUE: Contiguous axial images  were obtained from the base of the skull through the vertex without intravenous contrast.  COMPARISON:  Head CT scan 06/22/2011.  Brain MRI 06/30/2011.  FINDINGS: There is no evidence of acute intracranial abnormality including infarct, hemorrhage, mass lesion, mass effect or abnormal extra-axial fluid collection. No hydrocephalus or pneumocephalus. The calvarium is intact. Imaged paranasal sinuses and mastoid air cells are clear.  IMPRESSION: Negative head CT.   Electronically Signed   By: Inge Rise M.D.   On: 04/20/2014 11:31   Mr Jodene Nam Head Wo Contrast  04/21/2014   CLINICAL DATA:  Transient expressive aphasia. Altered mental status. Possible stroke.  EXAM: MRI HEAD WITHOUT CONTRAST  MRA HEAD WITHOUT CONTRAST  TECHNIQUE: Multiplanar, multiecho pulse sequences of the brain and surrounding structures were obtained without intravenous contrast. Angiographic images of the head were obtained using MRA technique without contrast.  COMPARISON:  Head CT 04/20/2014  and MRI 06/30/2011  FINDINGS: MRI HEAD FINDINGS  There is no evidence of acute infarct, intracranial hemorrhage, mass, midline shift, or extra-axial fluid collection. There is mild generalized cerebral atrophy. Small foci of T2 hyperintensity in the subcortical and deep cerebral white matter have mildly progressed on the prior MRI and are nonspecific but compatible with mild chronic small vessel ischemic disease, mildly advanced for age. Patchy T2 hyperintensity in the pons does not appear significantly changed and may also reflect sequelae of chronic ischemia.  Orbits are unremarkable. Minimal bilateral ethmoid air cell and right maxillary sinus mucosal thickening is noted. Mastoid air cells are clear. Major intracranial vascular flow voids are preserved.  MRA HEAD FINDINGS  The visualized distal left vertebral artery is patent and dominant and supplies the basilar. Distal right vertebral artery appears small and ends in PICA. Left PICA appears patent, although its origin was incompletely imaged. Basilar artery is patent without stenosis. Right AICA and bilateral SCA origins are patent. PCAs are unremarkable.  Internal carotid arteries are patent from skullbase to carotid termini without stenosis. There is a 3 mm inferomedially directed outpouching from the left supraclinoid ICA in the expected region of the posterior communicating artery origin, although a posterior communicating artery is not clearly identified. ACAs and MCAs are unremarkable. Anterior communicating artery is patent.  IMPRESSION: 1. No acute intracranial abnormality. 2. Chronic small vessel ischemic disease, mildly progressed from prior MRI. 3. No major intracranial arterial occlusion or stenosis. 4. 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   Electronically Signed   By: Logan Bores   On: 04/21/2014 11:36   Mr Brain Wo Contrast  04/21/2014   CLINICAL DATA:  Transient expressive aphasia. Altered mental status.  Possible stroke.  EXAM: MRI HEAD WITHOUT CONTRAST  MRA HEAD WITHOUT CONTRAST  TECHNIQUE: Multiplanar, multiecho pulse sequences of the brain and surrounding structures were obtained without intravenous contrast. Angiographic images of the head were obtained using MRA technique without contrast.  COMPARISON:  Head CT 04/20/2014 and MRI 06/30/2011  FINDINGS: MRI HEAD FINDINGS  There is no evidence of acute infarct, intracranial hemorrhage, mass, midline shift, or extra-axial fluid collection. There is mild generalized cerebral atrophy. Small foci of T2 hyperintensity in the subcortical and deep cerebral white matter have mildly progressed on the prior MRI and are nonspecific but compatible with mild chronic small vessel ischemic disease, mildly advanced for age. Patchy T2 hyperintensity in the pons does not appear significantly changed and may also reflect sequelae of chronic ischemia.  Orbits are unremarkable. Minimal bilateral ethmoid air cell and right maxillary sinus  mucosal thickening is noted. Mastoid air cells are clear. Major intracranial vascular flow voids are preserved.  MRA HEAD FINDINGS  The visualized distal left vertebral artery is patent and dominant and supplies the basilar. Distal right vertebral artery appears small and ends in PICA. Left PICA appears patent, although its origin was incompletely imaged. Basilar artery is patent without stenosis. Right AICA and bilateral SCA origins are patent. PCAs are unremarkable.  Internal carotid arteries are patent from skullbase to carotid termini without stenosis. There is a 3 mm inferomedially directed outpouching from the left supraclinoid ICA in the expected region of the posterior communicating artery origin, although a posterior communicating artery is not clearly identified. ACAs and MCAs are unremarkable. Anterior communicating artery is patent.  IMPRESSION: 1. No acute intracranial abnormality. 2. Chronic small vessel ischemic disease, mildly  progressed from prior MRI. 3. No major intracranial arterial occlusion or stenosis. 4. 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   Electronically Signed   By: Logan Bores   On: 04/21/2014 11:36   Echo:Study Conclusions  - Left ventricle: The cavity size was normal. Wall thickness was increased in a pattern of mild LVH. Systolic function was normal. The estimated ejection fraction was in the range of 50% to 55%. Regional wall motion abnormalities cannot be excluded. - Left atrium: The atrium was moderately dilated. - Right atrium: The atrium was moderately dilated  PHYSICAL EXAM General: Well developed, well nourished, in no acute distress. Head: Normocephalic, atraumatic, sclera non-icteric, oropharynx is clear Neck: Negative for carotid bruits. JVD not elevated. No adenopathy Lungs: Clear bilaterally to auscultation without wheezes, rales, or rhonchi. Breathing is unlabored. Heart: RRR S1 S2 without murmurs, rubs, or gallops.  Abdomen: Soft, non-tender, non-distended with normoactive bowel sounds. No hepatomegaly. No rebound/guarding. No obvious abdominal masses. Extremities: No clubbing, cyanosis or edema.  Distal pedal pulses are 2+ and equal bilaterally. Neuro: Alert and oriented X 3. Moves all extremities spontaneously. Tremulous Psych:  Responds to questions appropriately with a normal affect.  ASSESSMENT AND PLAN: 1. Atrial fibrillation. Converted spontaneously to NSR. Recurrent AFib his am. Moderate biatrial enlargement. Afib related to severe HTN, URI, UTI, and Etoh use. Continue diltiazem, labetalol. Discussed importance of Etoh abstinence. CHA2DS2-Vasc score 3-5 (CHF, HTN, DM +/- TIA), Recommend systemic anticoagulation. Currently on IV heparin. Would switch to oral agent if OK with neuro. Will DC plavix. Colonoscopy/Surgery planned in  future. Would need to come off anticoagulation for surgery. 2. URI  3. Acute diastolic CHF 2/2 infection, HTN and  a-fib  - Echo preserved EF 50-55%. LA size 4.9 cm  4. Hypertension: still elevated. On diltiazem and BiDil. Will increase Labetalol to 300 mg tid.  5. history of prostate cancer status post prostatectomy  6. newly diagnosed colovesicular fistula  - seen by surgery, no surgical intervention during this admission. Will need colonscopy later before attempt surgical intervention  7. Diabetes  8. alcohol abuse: discussed with cessation as it increase PAF. Tremulous today ? Due to Etoh withdrawal. 9. tobacco abuse  10. TIA: ?delirium  - CT and MRI of head negative, EEG normal. 11. Leukocytosis: WBC peaked 22, returned to normal after abx     Present on Admission:  . Sepsis . Urinary tract infection . Alcohol abuse . HTN (hypertension) . Hyponatremia . Tobacco abuse . Colovesical fistula . Elevated bilirubin . Sepsis due to urinary tract infection  Signed, Veronica Guerrant [place], [place] 04/22/2014 7:31 AM

## 2014-04-23 LAB — BASIC METABOLIC PANEL
Anion gap: 12 (ref 5–15)
BUN: 22 mg/dL (ref 6–23)
CHLORIDE: 94 meq/L — AB (ref 96–112)
CO2: 25 mEq/L (ref 19–32)
Calcium: 8.7 mg/dL (ref 8.4–10.5)
Creatinine, Ser: 1.38 mg/dL — ABNORMAL HIGH (ref 0.50–1.35)
GFR calc Af Amer: 62 mL/min — ABNORMAL LOW (ref >90)
GFR calc non Af Amer: 53 mL/min — ABNORMAL LOW (ref >90)
Glucose, Bld: 218 mg/dL — ABNORMAL HIGH (ref 70–99)
POTASSIUM: 4.1 meq/L (ref 3.7–5.3)
Sodium: 131 mEq/L — ABNORMAL LOW (ref 137–147)

## 2014-04-23 LAB — CBC
HEMATOCRIT: 37.4 % — AB (ref 39.0–52.0)
Hemoglobin: 13 g/dL (ref 13.0–17.0)
MCH: 33.9 pg (ref 26.0–34.0)
MCHC: 34.8 g/dL (ref 30.0–36.0)
MCV: 97.4 fL (ref 78.0–100.0)
Platelets: 291 10*3/uL (ref 150–400)
RBC: 3.84 MIL/uL — ABNORMAL LOW (ref 4.22–5.81)
RDW: 12.3 % (ref 11.5–15.5)
WBC: 7.4 10*3/uL (ref 4.0–10.5)

## 2014-04-23 LAB — GLUCOSE, CAPILLARY
Glucose-Capillary: 170 mg/dL — ABNORMAL HIGH (ref 70–99)
Glucose-Capillary: 246 mg/dL — ABNORMAL HIGH (ref 70–99)

## 2014-04-23 MED ORDER — GLUCERNA SHAKE PO LIQD
237.0000 mL | Freq: Two times a day (BID) | ORAL | Status: DC
  Filled 2014-04-23: qty 237

## 2014-04-23 MED ORDER — GLIPIZIDE 5 MG PO TABS: 2.5000 mg | ORAL_TABLET | Freq: Every day | ORAL | Status: DC

## 2014-04-23 MED ORDER — RIVAROXABAN 20 MG PO TABS: 20.0000 mg | ORAL_TABLET | Freq: Every day | ORAL | Status: DC

## 2014-04-23 MED ORDER — TAMSULOSIN HCL 0.4 MG PO CAPS: 0.4000 mg | ORAL_CAPSULE | Freq: Every day | ORAL | Status: DC

## 2014-04-23 MED ORDER — DILTIAZEM HCL ER COATED BEADS 240 MG PO CP24: 240.0000 mg | ORAL_CAPSULE | Freq: Every day | ORAL | Status: DC

## 2014-04-23 MED ORDER — LABETALOL HCL 300 MG PO TABS: 300.0000 mg | ORAL_TABLET | Freq: Three times a day (TID) | ORAL | Status: DC

## 2014-04-23 MED ORDER — LIVING WELL WITH DIABETES BOOK
Freq: Once | Status: DC
  Filled 2014-04-23: qty 1

## 2014-04-23 MED ORDER — LEVOFLOXACIN 750 MG PO TABS: 750.0000 mg | ORAL_TABLET | ORAL | Status: DC

## 2014-04-23 MED ORDER — ISOSORB DINITRATE-HYDRALAZINE 20-37.5 MG PO TABS: 1.0000 | ORAL_TABLET | Freq: Two times a day (BID) | ORAL | Status: DC

## 2014-04-23 MED ORDER — UNABLE TO FIND: Status: DC

## 2014-04-23 MED ORDER — POTASSIUM CHLORIDE CRYS ER 20 MEQ PO TBCR: 40.0000 meq | EXTENDED_RELEASE_TABLET | Freq: Every day | ORAL | Status: DC

## 2014-04-23 MED ORDER — GLUCERNA SHAKE PO LIQD: 237.0000 mL | Freq: Two times a day (BID) | ORAL | Status: DC

## 2014-04-23 NOTE — Progress Notes (Signed)
TELEMETRY: Reviewed telemetry pt in NSR, no recurrent Afib.  Filed Vitals:   04/22/14 2200 04/22/14 2311 04/23/14 0445 04/23/14 0548  BP: 177/79 138/72 144/87 158/79  Pulse: 75  60 73  Temp: 98.4 F (36.9 C)  97.7 F (36.5 C) 98.1 F (36.7 C)  TempSrc: Oral  Oral Oral  Resp: 18  18 18   Height:      Weight:   168 lb 8 oz (76.431 kg)   SpO2: 100%  100% 98%    Intake/Output Summary (Last 24 hours) at 04/23/14 0726 Last data filed at 04/22/14 1756  Gross per 24 hour  Intake    240 ml  Output      0 ml  Net    240 ml   Filed Weights   04/21/14 0500 04/22/14 0545 04/23/14 0445  Weight: 171 lb 1.2 oz (77.6 kg) 168 lb 11.2 oz (76.522 kg) 168 lb 8 oz (76.431 kg)    Subjective  No chest pain or SOB. Denies palpitations. Feels very well today.  Marland Kitchen apixaban  5 mg Oral BID  . diltiazem  240 mg Oral Daily  . feeding supplement (ENSURE COMPLETE)  237 mL Oral BID BM  . folic acid  1 mg Oral Daily  . furosemide  20 mg Oral Daily  . insulin aspart  0-15 Units Subcutaneous TID WC  . insulin aspart  0-5 Units Subcutaneous QHS  . insulin glargine  5 Units Subcutaneous QHS  . isosorbide-hydrALAZINE  1 tablet Oral BID  . labetalol  300 mg Oral TID  . levofloxacin  750 mg Oral Q24H  . multivitamin with minerals  1 tablet Oral Daily  . nicotine  14 mg Transdermal Daily  . nystatin  5 mL Oral QID  . potassium chloride  40 mEq Oral Daily  . saccharomyces boulardii  250 mg Oral BID  . sodium chloride  3 mL Intravenous Q12H  . tamsulosin  0.4 mg Oral QPC supper  . thiamine  100 mg Oral Daily      LABS: Basic Metabolic Panel:  Recent Labs  04/22/14 0141 04/23/14 0428  NA 130* 131*  K 4.1 4.1  CL 94* 94*  CO2 24 25  GLUCOSE 180* 218*  BUN 26* 22  CREATININE 1.51* 1.38*  CALCIUM 8.5 8.7   Liver Function Tests: No results found for this basename: AST, ALT, ALKPHOS, BILITOT, PROT, ALBUMIN,  in the last 72 hours No results found for this basename: LIPASE, AMYLASE,  in the last  72 hours CBC:  Recent Labs  04/22/14 0141 04/23/14 0428  WBC 9.2 7.4  HGB 13.3 13.0  HCT 37.1* 37.4*  MCV 95.4 97.4  PLT 287 291   Cardiac Enzymes: No results found for this basename: CKTOTAL, CKMB, CKMBINDEX, TROPONINI,  in the last 72 hours BNP: No results found for this basename: PROBNP,  in the last 72 hours D-Dimer: No results found for this basename: DDIMER,  in the last 72 hours Hemoglobin A1C:  Recent Labs  04/20/14 1413  HGBA1C 6.6*   Fasting Lipid Panel:  Recent Labs  04/22/14 0141  CHOL 81  HDL 41  LDLCALC 30  TRIG 52  CHOLHDL 2.0   Thyroid Function Tests: No results found for this basename: TSH, T4TOTAL, FREET3, T3FREE, THYROIDAB,  in the last 72 hours   Radiology/Studies:  Mr Virgel Paling Wo Contrast  04/21/2014   CLINICAL DATA:  Transient expressive aphasia. Altered mental status. Possible stroke.  EXAM: MRI HEAD WITHOUT CONTRAST  MRA  HEAD WITHOUT CONTRAST  TECHNIQUE: Multiplanar, multiecho pulse sequences of the brain and surrounding structures were obtained without intravenous contrast. Angiographic images of the head were obtained using MRA technique without contrast.  COMPARISON:  Head CT 04/20/2014 and MRI 06/30/2011  FINDINGS: MRI HEAD FINDINGS  There is no evidence of acute infarct, intracranial hemorrhage, mass, midline shift, or extra-axial fluid collection. There is mild generalized cerebral atrophy. Small foci of T2 hyperintensity in the subcortical and deep cerebral white matter have mildly progressed on the prior MRI and are nonspecific but compatible with mild chronic small vessel ischemic disease, mildly advanced for age. Patchy T2 hyperintensity in the pons does not appear significantly changed and may also reflect sequelae of chronic ischemia.  Orbits are unremarkable. Minimal bilateral ethmoid air cell and right maxillary sinus mucosal thickening is noted. Mastoid air cells are clear. Major intracranial vascular flow voids are preserved.  MRA HEAD  FINDINGS  The visualized distal left vertebral artery is patent and dominant and supplies the basilar. Distal right vertebral artery appears small and ends in PICA. Left PICA appears patent, although its origin was incompletely imaged. Basilar artery is patent without stenosis. Right AICA and bilateral SCA origins are patent. PCAs are unremarkable.  Internal carotid arteries are patent from skullbase to carotid termini without stenosis. There is a 3 mm inferomedially directed outpouching from the left supraclinoid ICA in the expected region of the posterior communicating artery origin, although a posterior communicating artery is not clearly identified. ACAs and MCAs are unremarkable. Anterior communicating artery is patent.  IMPRESSION: 1. No acute intracranial abnormality. 2. Chronic small vessel ischemic disease, mildly progressed from prior MRI. 3. No major intracranial arterial occlusion or stenosis. 4. 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   Electronically Signed   By: Logan Bores   On: 04/21/2014 11:36   Mr Brain Wo Contrast  04/21/2014   CLINICAL DATA:  Transient expressive aphasia. Altered mental status. Possible stroke.  EXAM: MRI HEAD WITHOUT CONTRAST  MRA HEAD WITHOUT CONTRAST  TECHNIQUE: Multiplanar, multiecho pulse sequences of the brain and surrounding structures were obtained without intravenous contrast. Angiographic images of the head were obtained using MRA technique without contrast.  COMPARISON:  Head CT 04/20/2014 and MRI 06/30/2011  FINDINGS: MRI HEAD FINDINGS  There is no evidence of acute infarct, intracranial hemorrhage, mass, midline shift, or extra-axial fluid collection. There is mild generalized cerebral atrophy. Small foci of T2 hyperintensity in the subcortical and deep cerebral white matter have mildly progressed on the prior MRI and are nonspecific but compatible with mild chronic small vessel ischemic disease, mildly advanced for age. Patchy T2  hyperintensity in the pons does not appear significantly changed and may also reflect sequelae of chronic ischemia.  Orbits are unremarkable. Minimal bilateral ethmoid air cell and right maxillary sinus mucosal thickening is noted. Mastoid air cells are clear. Major intracranial vascular flow voids are preserved.  MRA HEAD FINDINGS  The visualized distal left vertebral artery is patent and dominant and supplies the basilar. Distal right vertebral artery appears small and ends in PICA. Left PICA appears patent, although its origin was incompletely imaged. Basilar artery is patent without stenosis. Right AICA and bilateral SCA origins are patent. PCAs are unremarkable.  Internal carotid arteries are patent from skullbase to carotid termini without stenosis. There is a 3 mm inferomedially directed outpouching from the left supraclinoid ICA in the expected region of the posterior communicating artery origin, although a posterior communicating artery is not  clearly identified. ACAs and MCAs are unremarkable. Anterior communicating artery is patent.  IMPRESSION: 1. No acute intracranial abnormality. 2. Chronic small vessel ischemic disease, mildly progressed from prior MRI. 3. No major intracranial arterial occlusion or stenosis. 4. 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   Electronically Signed   By: Logan Bores   On: 04/21/2014 11:36   Echo:Study Conclusions  - Left ventricle: The cavity size was normal. Wall thickness was increased in a pattern of mild LVH. Systolic function was normal. The estimated ejection fraction was in the range of 50% to 55%. Regional wall motion abnormalities cannot be excluded. - Left atrium: The atrium was moderately dilated. - Right atrium: The atrium was moderately dilated  PHYSICAL EXAM General: Well developed, well nourished, in no acute distress. Head: Normocephalic, atraumatic, sclera non-icteric, oropharynx is clear Neck: Negative for  carotid bruits. JVD not elevated. No adenopathy Lungs: Clear bilaterally to auscultation without wheezes, rales, or rhonchi. Breathing is unlabored. Heart: RRR S1 S2 without murmurs, rubs, or gallops.  Abdomen: Soft, non-tender, non-distended with normoactive bowel sounds. No hepatomegaly. No rebound/guarding. No obvious abdominal masses. Extremities: No clubbing, cyanosis or edema.  Distal pedal pulses are 2+ and equal bilaterally. Neuro: Alert and oriented X 3. Moves all extremities spontaneously. Tremulous Psych:  Responds to questions appropriately with a normal affect.  ASSESSMENT AND PLAN: 1. Atrial fibrillation. Converted spontaneously to NSR. Recurrent AFib. Moderate biatrial enlargement. Afib related to severe HTN, URI, UTI, and Etoh use. Continue diltiazem, labetalol. Discussed importance of Etoh abstinence. CHA2DS2-Vasc score 3-5 (CHF, HTN, DM +/- TIA), Now on Eliquis.  DC plavix. Colonoscopy/Surgery planned in  future. Would need to come off anticoagulation for surgery. 2. URI  3. Acute diastolic CHF 2/2 infection, HTN and a-fib  - Echo preserved EF 50-55%. LA size 4.9 cm  4. Hypertension: still elevated. On diltiazem, BiDil, and labetolol. Avapro held to to acute renal insufficiency. Renal function improved. For now may need to increase hydralazine component of BiDil. 5. history of prostate cancer status post prostatectomy  6. newly diagnosed colovesicular fistula  - seen by surgery, no surgical intervention during this admission. Will need colonscopy later before attempt surgical intervention  7. Diabetes  8. alcohol abuse: discussed with cessation as it increase PAF. Tremulous today ? Due to Etoh withdrawal. 9. tobacco abuse  10. TIA: ?delirium  - CT and MRI of head negative, EEG normal. 11. Leukocytosis: WBC peaked 22, returned to normal after abx     Present on Admission:  . Sepsis . Urinary tract infection . Alcohol abuse . HTN (hypertension) . Hyponatremia .  Tobacco abuse . Colovesical fistula . Elevated bilirubin . Sepsis due to urinary tract infection  Signed, Douglas Ballard, [place] 04/23/2014 7:26 AM

## 2014-04-23 NOTE — Plan of Care (Signed)
Problem: Food- and Nutrition-Related Knowledge Deficit (NB-1.1) Goal: Nutrition education Formal process to instruct or train a patient/client in a skill or to impart knowledge to help patients/clients voluntarily manage or modify food choices and eating behavior to maintain or improve health. Outcome: Completed/Met Date Met:  04/23/14  RD consulted for nutrition education regarding diabetes.     Lab Results  Component Value Date    HGBA1C 6.6* 04/20/2014    RD provided "Carbohydrate Counting for People with Diabetes" handout from the Academy of [employer]. Discussed different food groups and their effects on blood sugar, emphasizing carbohydrate-containing foods. Provided list of carbohydrates and recommended serving sizes of common foods.  Discussed importance of controlled and consistent carbohydrate intake throughout the day. Provided examples of ways to balance meals/snacks and encouraged intake of high-fiber, whole grain complex carbohydrates. Teach back method used.   Lipid Panel     Component Value Date/Time    CHOL 81 04/22/2014 0141    TRIG 52 04/22/2014 0141    HDL 41 04/22/2014 0141    CHOLHDL 2.0 04/22/2014 0141    VLDL 10 04/22/2014 0141    LDLCALC 30 04/22/2014 0141    RD provided "Heart Healthy Nutrition Therapy" handout from the [employer] of [employer]. Reviewed patient's dietary recall. Provided examples on ways to decrease sodium and fat intake in diet. Discouraged intake of processed foods and use of salt shaker. Encouraged fresh fruits and vegetables as well as whole grain sources of carbohydrates to maximize fiber intake. Teach back method used.   Expect good compliance. Pt and pt's wife very eager and receptive to educations. Diet recall indicated pt consuming large amounts of juices and sweet teas, that pt was agreeable to decrease and/or modify to have lower sugar content. Wife has encouraged intake of whole grains, and plan on incorporating more  whole wheat pastas and rice into diet.   Pt's wife also with question regarding protein and supplement intake to prepare pt for upcoming fistula surgery. Recommended pt consume heart healthy proteins with meals and snacks, and modified supplement to Glucerna shake BID for blood glucose control.  Body mass index is 24.87 kg/(m^2). Pt meets criteria for Normal weight based on current BMI.  Current diet order is Heart Healthy, patient is consuming approximately 100% of meals at this time. Labs and medications reviewed. Modified to Heart Healthy/Carb Modified diet.  No further nutrition interventions warranted at this time. Would benefit from outpatient RD referral [facility] for follow up and reinforcement.  RD contact information provided. If additional nutrition issues arise, please re-consult RD.   F  MS RD LDN Clinical Dietitian Pager:[phone]         

## 2014-04-23 NOTE — Progress Notes (Signed)
Inpatient Diabetes Program Recommendations  AACE/ADA: New Consensus Statement on Inpatient Glycemic Control (2013)  Target Ranges:  Prepandial:   less than 140 mg/dL      Peak postprandial:   less than 180 mg/dL (1-2 hours)      Critically ill patients:  140 - 180 mg/dL     Results for Douglas Ballard, Douglas Ballard (MRN [id number]) as of 04/23/2014 14:07  Ref. Range 04/20/2014 14:13  Hemoglobin A1C Latest Range: <5.7 % 6.6 (H)    Spoke with pt about new diagnosis of DM.  Discussed A1C results with him and explained what an A1C is, basic pathophysiology of DM Type 2, basic home care, importance of checking CBGs and maintaining good CBG control to prevent long-term and short-term complications.  RNs to provide ongoing basic DM education at bedside with this patient.  Have ordered educational booklet for this patient along with Outpatient DM education referral to the [place] and DM management center.  Patient told me he has known that he has had "pre-DM" for a while now.  Is serious about making changes to improve his health.  Encouraged patient to purchase a CBG meter (either with a Rx from his PCP or OTC).  Can purchase CBG meter OTC at [employer] ([employer]).  Encouraged patient to check his CBGs at least once daily and to check at a different time each day to get a variety of readings (either before meals or 2 hours after a meal).  Reviewed CBG goals and encouraged patient to take his CBG log with him to all PCP appointments.  Patient and wife very appreciative of my visit.  Patient very willing to make changes to improve his CBG control at home.    Will follow Wyn Quaker RN, MSN, CDE Diabetes Coordinator Inpatient Diabetes Program Team Pager: [phone] (8a-10p)

## 2014-04-23 NOTE — Discharge Summary (Addendum)
Physician Discharge Summary  Douglas Ballard [id number] DOB: 1951/03/07 DOA: 04/14/2014  PCP: Stephens Shire, MD  Admit date: 04/14/2014 Discharge date: 04/23/2014  Recommendations for Outpatient Follow-up:  1. Pt will need to follow up with PCP in 1 week  post discharge 2. Please obtain BMP in 1 week--restart avapro at your PCPs discretion pending lab results   Discharge Diagnoses:  sepsis  -present at time of admission  -due to UTI and bacteremia: E. 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 Leonel Ramsay  -Irving Shows to be discontinued for at least 24 hours prior to surgery  -However, due to formulary restrains from his insurance company, the patient will be  discharged with rivaroxaban 20 mg daily for secondary stroke prophylaxis Acute Diastolic CHF  -[id number] echocardiogram EF 50-55%  -Daily weights--negative approximately 4 kg for the admission  -Fluid restrict  -continue furosemide 20mg  daily HTN:  -Continue holding avapro due to ARF  -lasix also helping with BP  -Due to new onset paroxysmal atrial fibrillation, Norvasc has been discontinue and antihypertensive drug use is w/ diltiazem, -continue BiDil  -Increase labetalol per cardiology recommendations  -Continue diltiazem  -Patient will need to follow up with his primary care provider for recheck of his BMP to determine the feasibility of restarting his ARB Diabetes mellitus type 2  -04/20/2014 Hemoglobin A1c was 6.6 -On 06/26/2011, the patient had hemoglobin A1c of 6.8 -I discussed the risks, benefits, and alternatives of starting a hypoglycemic agent with patient and his wife--they were agreeable to starting glipizide at the time of discharge 2.5 mg daily -The patient was instructed to followup with his primary care provider for further adjustment of his diabetic regimen hyponatremia:  -secondary to alcohol abuse and dehydration initially; then secondary to hypervolemic state  -mild improved with IVF's initially; with subsequent worsening renal function and development of vascular congestion/mild pulmonary edema  -Continue restricting IVF's to 1.5L daily  alcohol abuse with delirium/withdrawal:  -CIWA protocol completed,  -Continue thiamine and folic acid  -cessation counseling provided  -Mentation is now back to baseline.  -Will use as needed Ativan for anxiety prn  -The patient remained clinically stable without any signs of withdrawal for at least 2 days prior to discharge  atrial fibrillation:  -Appears to be secondary to ongoing infection and withdrawal from alcohol abruptly cessation during hospitalization.  -Showed brief episode with conversion back to sinus rhythm-->back  to Afib evening of 04/21/14  The patient converted spontaneously back to sinus rhythm on 04/22/2014 - -continue diltiazem long acting  -switch to po labetolol per cardiology-->increase dose  -2-D echo has been done and demonstrated no wall motion abnormalities and has preserved ejection fraction. No further cardiac evaluation required at this moment  acute resp failure with hypoxia:  -resolved  -due to vascular congestion and pulmonary edema  -2-D echo with preserved EF and no WMA.uncontrolled hypertension and alcohol abuse. There is LVH appreciated on echo.  -as needed supplemental oxygen  -Continue PRN nebulizer treatment  -continue lasix, transitioned from by mouth 40 mg twice a day times one more day and after that, will resume 20 mg daily  -Low-sodium diet has been discussed with the patient along with fluid restriction (1.5 in 24 hours).  -d/c pulmicort  BPH:  -will continue flomax  Diarrhea  - Clostridium difficile PCR--neg tobacco abuse:  -cessation counseling provided  Code Status: Full  Family Communication: wife updated at bedside   Discharge Condition: Stable   Disposition:  home  Follow-up Information   Follow up with Rob Hickman, MD On 04/29/2014. (patietn is to go to main eneternce of hospital and then to Radiology. appointment is for 1300 (1 PM))    Specialty:  Interventional Radiology   Contact information:   [address] [place] [address] [phone]       Follow up with BURNETT,BRENT A, MD In 1 week.   Specialty:  Family Medicine   Contact information:   [employer] [place] [address] [phone]       Follow up with Peter Martinique, MD In 2 weeks.   Specialty:  Cardiology   Contact information:   [address] [place] [address] [phone]       Diet:heart healthy Wt Readings from Last 3 Encounters:  04/23/14 76.431 kg (168 lb 8 oz)  03/24/14 80.287 kg (177 lb)  07/02/11 76 kg (167  lb 8.8 oz)    History of present illness:  63 y.o. male has a past medical history significant for HTN, prostate cancer s/p prostatectomy by Urology (Dr. Risa Grill), newly diagnosed colovesicular fistula followed by Dr. Johney Maine and with planned surgical intervention soon, presents to the ED with new onset left flank pain and dysuria.  Found to have sepsis due to UTI (E.Coli) and positive blood cx's (proteus mirabilis). admisison complicated with vascular congestion/pulmonary edema, acute renal failure and atrial fibrillation. Cardiology was consulted. Dr. Peter Martinique followed the patient during the hospitalization for his atrial fibrillation and CHF. The patient was started on oral diltiazem. He was placed on a labetalol drip because of hypertensive urgency. This was subsequently transitioned to oral labetalol with the assistance of Dr. Peter Martinique. The patient had expressive aphasia on 04/20/2014. MRI of the brain was negative for acute stroke. Neurology was consulted and was concern for TIA. The patient was started on apixiban. EEG was negative for any epileptiform discharges.     Consultants: Cardiology--Dr. Martinique  Discharge Exam: Filed Vitals:   04/23/14 1339  BP: 161/76  Pulse: 71  Temp: 98.3 F (36.8 C)  Resp: 18   Filed Vitals:   04/22/14 2311 04/23/14 0445 04/23/14 0548 04/23/14 1339  BP: 138/72 144/87 158/79 161/76  Pulse:  60 73 71  Temp:  97.7 F (36.5 C) 98.1 F (36.7 C) 98.3 F (36.8 C)  TempSrc:  Oral Oral Oral  Resp:  18 18 18  Height:      Weight:  76.431 kg (168 lb 8 oz)    SpO2:  100% 98% 99%   General: A&O x 3, NAD, pleasant, cooperative Cardiovascular: RRR, no rub, no gallop, no S3 Respiratory: CTAB, no wheeze, no rhonchi Abdomen:soft, nontender, nondistended, positive bowel sounds Extremities: No edema, No lymphangitis, no petechiae  Discharge Instructions      Discharge Instructions   Diet - low sodium heart healthy    Complete by:  As directed        Discharge instructions    Complete by:  As directed   Do not take olmesartan (Benicar) until the followup with her family doctor and instructed to restart     Increase activity slowly    Complete by:  As directed             Medication List    STOP taking these medications       amLODipine 5 MG tablet  Commonly known as:  NORVASC     atenolol 100 MG tablet  Commonly known as:  TENORMIN     doxazosin 4 MG tablet  Commonly known as:  CARDURA     olmesartan 40 MG tablet  Commonly known as:  BENICAR      TAKE these medications       diltiazem 240 MG 24 hr capsule  Commonly known as:  CARDIZEM CD  Take 1 capsule (240 mg total) by mouth daily.     feeding supplement (GLUCERNA SHAKE) Liqd  Take 237 mLs by mouth 2 (two) times daily between meals.     furosemide 20 MG tablet  Commonly known as:  LASIX  Take 20 mg by mouth daily.     glipiZIDE 5 MG tablet  Commonly known as:  GLUCOTROL  Take 0.5 tablets (2.5 mg total) by mouth daily before breakfast.     isosorbide-hydrALAZINE 20-37.5 MG per tablet  Commonly known as:  BIDIL  Take 1 tablet by mouth 2 (two) times daily.     labetalol 300 MG tablet  Commonly known as:  NORMODYNE  Take 1 tablet (300 mg total) by mouth 3 (three) times daily.     levofloxacin 750 MG tablet  Commonly known as:  LEVAQUIN  Take 1 tablet (750 mg total) by mouth daily.     multivitamins ther. w/minerals Tabs tablet  Take 1 tablet by mouth daily.     potassium chloride SA 20 MEQ tablet  Commonly known as:  K-DUR,KLOR-CON  Take 2 tablets (40 mEq total) by mouth daily.     rivaroxaban 20 MG Tabs tablet  Commonly known as:  XARELTO  Take 1 tablet (20 mg total) by mouth daily with supper.     tamsulosin 0.4 MG Caps capsule  Commonly known as:  FLOMAX  Take 1 capsule (0.4 mg total) by mouth daily after supper.     UNABLE TO FIND  Mr. Earley Pock was hospitalized from 04/14/14 until 04/23/14.  His wife was present during the entire  hospitalization to assist in the care of the patient.     zolpidem 5 MG tablet  Commonly known as:  AMBIEN  Take 5 mg by mouth at bedtime as needed for sleep.         The results of significant diagnostics from this hospitalization (including imaging, microbiology, ancillary and laboratory) are listed below for reference.    Significant Diagnostic Studies: Dg Chest 2 View  04/18/2014   CLINICAL DATA:  63 year old male with pulmonary edema.  Initial encounter.  EXAM: CHEST  2 VIEW  COMPARISON:  04/16/2014 and earlier.  FINDINGS: Interval regressed asymmetric bilateral peribronchovascular opacity. The findings are not completely resolved. Small bilateral pleural effusions. Stable cardiac size and mediastinal contours. Visualized tracheal air column is within normal limits. No pneumothorax. No areas of worsening ventilation. Chronic upper thoracic compression fracture. Stable visualized osseous structures.  IMPRESSION: Regressed bilateral pulmonary opacity compatible with decreasing asymmetric pulmonary edema. Small bilateral effusions.   Electronically Signed   By: Lars Pinks M.D.   On: 04/18/2014 08:20   Dg Chest 2 View  04/16/2014   CLINICAL DATA:  63 year old male with shortness of breath.  EXAM: CHEST  2 VIEW  COMPARISON:  04/14/2014 and prior chest radiographs dating back to 02/05/2007  FINDINGS: New bilateral central airspace opacities identified-likely moderate pulmonary edema.  Tiny bilateral pleural effusions are noted.  There is no evidence of pneumothorax or acute bony abnormality.  Upper limits normal heart size again noted.  IMPRESSION: New bilateral central airspace opacities likely representing moderate pulmonary edema. Tiny bilateral pleural effusions.   Electronically Signed   By: Hassan Rowan M.D.   On: 04/16/2014 08:43   Ct Head Wo Contrast  04/20/2014   CLINICAL DATA:  Altered mental status.  EXAM: CT HEAD WITHOUT CONTRAST  TECHNIQUE: Contiguous axial images were obtained from the base  of the skull through the vertex without intravenous contrast.  COMPARISON:  Head CT scan 06/22/2011.  Brain MRI 06/30/2011.  FINDINGS: There is no evidence of acute intracranial abnormality including infarct, hemorrhage, mass lesion, mass effect or abnormal extra-axial fluid collection. No hydrocephalus or pneumocephalus. The calvarium is intact. Imaged paranasal sinuses and mastoid air cells are clear.  IMPRESSION: Negative head CT.   Electronically Signed   By: Inge Rise M.D.   On: 04/20/2014 11:31   Mr Jodene Nam Head Wo Contrast  04/21/2014   CLINICAL DATA:  Transient expressive aphasia. Altered mental status. Possible stroke.  EXAM: MRI HEAD WITHOUT CONTRAST  MRA HEAD WITHOUT CONTRAST  TECHNIQUE: Multiplanar, multiecho pulse sequences of the brain and surrounding structures were obtained without intravenous contrast. Angiographic images of the head were obtained using MRA technique without contrast.  COMPARISON:  Head CT 04/20/2014 and MRI 06/30/2011  FINDINGS: MRI HEAD FINDINGS  There is no evidence of acute infarct, intracranial hemorrhage, mass, midline shift, or extra-axial fluid collection. There is mild generalized cerebral atrophy. Small foci of T2 hyperintensity in the subcortical and deep cerebral white matter have mildly progressed on the prior MRI and are nonspecific but compatible with mild chronic small vessel ischemic disease, mildly advanced for age. Patchy T2 hyperintensity in the pons does not appear significantly changed and may also reflect sequelae of chronic ischemia.  Orbits are unremarkable. Minimal bilateral ethmoid air cell and right maxillary sinus mucosal thickening is noted. Mastoid air cells are clear. Major intracranial vascular flow voids are preserved.  MRA HEAD FINDINGS  The visualized distal left vertebral artery is patent and dominant and supplies the basilar. Distal right vertebral artery appears small and ends in PICA. Left PICA appears patent, although its origin was  incompletely imaged. Basilar artery is patent without stenosis. Right AICA and bilateral SCA origins are patent. PCAs are unremarkable.  Internal carotid arteries are patent from skullbase to carotid termini without stenosis. There is a 3 mm inferomedially directed outpouching from the left supraclinoid ICA in the expected region of the posterior communicating artery origin, although a posterior communicating artery is not clearly identified. ACAs and MCAs  are unremarkable. Anterior communicating artery is patent.  IMPRESSION: 1. No acute intracranial abnormality. 2. Chronic small vessel ischemic disease, mildly progressed from prior MRI. 3. No major intracranial arterial occlusion or stenosis. 4. 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   Electronically Signed   By: Logan Bores   On: 04/21/2014 11:36   Mr Brain Wo Contrast  04/21/2014   CLINICAL DATA:  Transient expressive aphasia. Altered mental status. Possible stroke.  EXAM: MRI HEAD WITHOUT CONTRAST  MRA HEAD WITHOUT CONTRAST  TECHNIQUE: Multiplanar, multiecho pulse sequences of the brain and surrounding structures were obtained without intravenous contrast. Angiographic images of the head were obtained using MRA technique without contrast.  COMPARISON:  Head CT 04/20/2014 and MRI 06/30/2011  FINDINGS: MRI HEAD FINDINGS  There is no evidence of acute infarct, intracranial hemorrhage, mass, midline shift, or extra-axial fluid collection. There is mild generalized cerebral atrophy. Small foci of T2 hyperintensity in the subcortical and deep cerebral white matter have mildly progressed on the prior MRI and are nonspecific but compatible with mild chronic small vessel ischemic disease, mildly advanced for age. Patchy T2 hyperintensity in the pons does not appear significantly changed and may also reflect sequelae of chronic ischemia.  Orbits are unremarkable. Minimal bilateral ethmoid air cell and right maxillary sinus mucosal  thickening is noted. Mastoid air cells are clear. Major intracranial vascular flow voids are preserved.  MRA HEAD FINDINGS  The visualized distal left vertebral artery is patent and dominant and supplies the basilar. Distal right vertebral artery appears small and ends in PICA. Left PICA appears patent, although its origin was incompletely imaged. Basilar artery is patent without stenosis. Right AICA and bilateral SCA origins are patent. PCAs are unremarkable.  Internal carotid arteries are patent from skullbase to carotid termini without stenosis. There is a 3 mm inferomedially directed outpouching from the left supraclinoid ICA in the expected region of the posterior communicating artery origin, although a posterior communicating artery is not clearly identified. ACAs and MCAs are unremarkable. Anterior communicating artery is patent.  IMPRESSION: 1. No acute intracranial abnormality. 2. Chronic small vessel ischemic disease, mildly progressed from prior MRI. 3. No major intracranial arterial occlusion or stenosis. 4. 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   Electronically Signed   By: Logan Bores   On: 04/21/2014 11:36   US Renal  04/14/2014   CLINICAL DATA:  63 year old male with left costovertebral angle pain and tenderness. Initial encounter. Urinary tract infection.  EXAM: RENAL/URINARY TRACT ULTRASOUND COMPLETE  COMPARISON:  [facility] CT Abdomen and Pelvis 02/24/2014.  FINDINGS: Right Kidney:  Length: 13.6 cm. Small area of upper pole scarring demonstrated on image 3 today, series 2, image 26 of the comparison. Otherwise echogenicity within normal limits. No mass or hydronephrosis visualized.  Left Kidney:  Length: 12.5 cm. Echogenicity within normal limits. No mass or hydronephrosis visualized.  Bladder:  Decompressed, not visible.  IMPRESSION: Negative sonographic appearance of the kidneys. Decompressed bladder.   Electronically Signed   By: Lars Pinks  M.D.   On: 04/14/2014 10:11   Dg Chest Port 1 View  04/14/2014   CLINICAL DATA:  Fever, sepsis  EXAM: PORTABLE CHEST - 1 VIEW  COMPARISON:  06/28/2011  FINDINGS: Cardiomegaly with pulmonary vascular congestion and possible mild interstitial edema. Mild bilateral lower lobe opacities, likely atelectasis. No focal consolidation. No pleural effusion or pneumothorax.  IMPRESSION: Screening loop pulmonary vascular congestion and possible mild interstitial edema.  Mild patchy bilateral lower lobe opacities, likely atelectasis.   Electronically Signed   By: Julian Hy M.D.   On: 04/14/2014 08:34     Microbiology: Recent Results (from the past 240 hour(s))  CULTURE, BLOOD (ROUTINE X 2)     Status: None   Collection Time    04/14/14  6:59 AM      Result Value Ref Range Status   Specimen Description BLOOD RIGHT FOREARM   Final   Special Requests BOTTLES DRAWN AEROBIC AND ANAEROBIC 5ML   Final   Culture  Setup Time     Final   Value: 04/14/2014 11:11     Performed at [place]     Final   Value: NO GROWTH 5 DAYS     Performed at [facility]   Report Status 04/20/2014 FINAL   Final  CULTURE, BLOOD (ROUTINE X 2)     Status: None   Collection Time    04/14/14  7:36 AM      Result Value Ref Range Status   Specimen Description BLOOD LEFT ANTECUBITAL   Final   Special Requests BOTTLES DRAWN AEROBIC AND ANAEROBIC 5ML   Final   Culture  Setup Time     Final   Value: 04/14/2014 11:12     Performed at [facility]   Culture     Final   Value: PROTEUS MIRABILIS     ESCHERICHIA COLI     Note: Gram Stain Report Called to,Read Back By and Verified With: [name] ON 04/14/2014 AT 11:23P BY [name]     Performed at [facility]   Report Status 04/17/2014 FINAL   Final   Organism ID, Bacteria PROTEUS MIRABILIS   Final   Organism ID, Bacteria ESCHERICHIA COLI   Final  URINE CULTURE     Status: None   Collection Time    04/14/14  7:59 AM      Result  Value Ref Range Status   Specimen Description IN/OUT CATH URINE   Final   Special Requests NONE   Final   Culture  Setup Time     Final   Value: 04/14/2014 11:25     Performed at [place]     Final   Value: >=100,000 COLONIES/ML     Performed at [facility]   Culture     Final   Value: [place]     Performed at [facility]   Report Status 04/18/2014 FINAL   Final   Organism ID, Bacteria ESCHERICHIA COLI   Final   Organism ID, Bacteria PROTEUS MIRABILIS   Final  CULTURE, BLOOD (ROUTINE X 2)     Status: None   Collection Time    04/16/14  2:00 PM      Result Value Ref Range Status   Specimen Description BLOOD LEFT ARM   Final   Special Requests BOTTLES DRAWN AEROBIC AND ANAEROBIC  10CC   Final   Culture  Setup Time     Final   Value: 04/16/2014 17:15     Performed at [facility]   Culture     Final   Value: NO GROWTH 5 DAYS     Performed at [facility]   Report Status 04/22/2014 FINAL   Final  CULTURE, BLOOD (ROUTINE X 2)     Status: None   Collection Time    04/16/14  2:12 PM      Result Value Ref Range Status   Specimen Description BLOOD LEFT HAND   Final   Special Requests BOTTLES DRAWN AEROBIC ONLY  5CC    Final   Culture  Setup Time     Final   Value: 04/16/2014 17:16     Performed at [facility]   Culture     Final   Value: NO GROWTH 5 DAYS     Performed at [facility]   Report Status 04/22/2014 FINAL   Final  CLOSTRIDIUM DIFFICILE BY PCR     Status: None   Collection Time    04/19/14  6:42 PM      Result Value Ref Range Status   C difficile by pcr NEGATIVE  NEGATIVE Final   Comment: Performed at [facility]  MRSA PCR SCREENING     Status: None   Collection Time    04/20/14  2:38 PM      Result Value Ref Range Status   MRSA by PCR NEGATIVE  NEGATIVE Final   Comment:            The GeneXpert MRSA Assay (FDA     approved for NASAL specimens      only), is one component of a     comprehensive MRSA colonization     surveillance program. It is not     intended to diagnose MRSA     infection nor to guide or     monitor treatment for     MRSA infections.     Labs: Basic Metabolic Panel:  Recent Labs Lab 04/18/14 0507 04/20/14 0442 04/21/14 0932 04/22/14 0141 04/23/14 0428  NA 131* 131* 126* 130* 131*  K 3.9 3.4* 3.6* 4.1 4.1  CL 95* 90* 87* 94* 94*  CO2 22 26 25 24 25   GLUCOSE 167* 212* 277* 180* 218*  BUN 35* 28* 26* 26* 22  CREATININE 1.45* 1.15 1.38* 1.51* 1.38*  CALCIUM 8.5 8.8 8.3* 8.5 8.7   Liver Function Tests: No results found for this basename: AST, ALT, ALKPHOS, BILITOT, PROT, ALBUMIN,  in the last 168 hours No results found for this basename: LIPASE, AMYLASE,  in the last 168 hours No results found for this basename: AMMONIA,  in the last 168 hours CBC:  Recent Labs Lab 04/18/14 0507 04/20/14 0442 04/22/14 0141 04/23/14 0428  WBC 9.7 10.1 9.2 7.4  HGB 13.8 14.6 13.3 13.0  HCT 38.0* 39.9 37.1* 37.4*  MCV 93.8 95.2 95.4 97.4  PLT 168 233 287 291   Cardiac Enzymes: No results found for this basename: CKTOTAL, CKMB, CKMBINDEX, TROPONINI,  in the last 168 hours BNP: No components found with this basename: POCBNP,  CBG:  Recent Labs Lab 04/22/14 1148 04/22/14 1657 04/22/14 2055 04/23/14 0737 04/23/14 1142  GLUCAP 207* 236* 177* 170* 246*    Time coordinating discharge:  Greater than 30 minutes  Signed:  Mickell Birdwell, DO [facility] Pager: [phone] 04/23/2014, 1:46 PM

## 2014-04-28 ENCOUNTER — Telehealth: Payer: Self-pay | Admitting: Cardiology

## 2014-04-29 ENCOUNTER — Other Ambulatory Visit (HOSPITAL_COMMUNITY): Payer: BC Managed Care – PPO

## 2014-04-30 ENCOUNTER — Other Ambulatory Visit (HOSPITAL_COMMUNITY): Payer: Self-pay | Admitting: Neurology

## 2014-04-30 ENCOUNTER — Other Ambulatory Visit (HOSPITAL_COMMUNITY): Payer: Self-pay | Admitting: Interventional Radiology

## 2014-04-30 DIAGNOSIS — I729 Aneurysm of unspecified site: Secondary | ICD-10-CM

## 2014-05-03 NOTE — Telephone Encounter (Signed)
Closed encounter °

## 2014-05-05 ENCOUNTER — Other Ambulatory Visit (HOSPITAL_COMMUNITY): Payer: Self-pay | Admitting: Family Medicine

## 2014-05-05 ENCOUNTER — Ambulatory Visit (HOSPITAL_COMMUNITY)
Admission: RE | Admit: 2014-05-05 | Discharge: 2014-05-05 | Disposition: A | Discharge status: Home / Self Care | Payer: BC Managed Care – PPO | Source: Ambulatory Visit | Attending: Family Medicine | Admitting: Family Medicine

## 2014-05-05 DIAGNOSIS — M7989 Other specified soft tissue disorders: Secondary | ICD-10-CM

## 2014-05-05 DIAGNOSIS — M79609 Pain in unspecified limb: Secondary | ICD-10-CM | POA: Diagnosis present

## 2014-05-05 NOTE — Progress Notes (Signed)
VASCULAR LAB PRELIMINARY  PRELIMINARY  PRELIMINARY  PRELIMINARY  Left lower extremity venous duplex completed.    Preliminary report:  Left:  No evidence of DVT, superficial thrombosis, or Baker's cyst.   Rosaelena Kemnitz, RVT 05/05/2014, 6:10 PM

## 2014-05-11 ENCOUNTER — Ambulatory Visit (INDEPENDENT_AMBULATORY_CARE_PROVIDER_SITE_OTHER): Payer: BC Managed Care – PPO | Admitting: Physician Assistant

## 2014-05-11 ENCOUNTER — Encounter: Payer: Self-pay | Admitting: Physician Assistant

## 2014-05-11 ENCOUNTER — Ambulatory Visit (HOSPITAL_COMMUNITY)
Admission: RE | Admit: 2014-05-11 | Discharge: 2014-05-11 | Disposition: A | Discharge status: Home / Self Care | Payer: BC Managed Care – PPO | Source: Ambulatory Visit | Attending: Neurology | Admitting: Neurology

## 2014-05-11 VITALS — BP 130/56 | HR 88 | Ht 67.75 in | Wt 168.5 lb

## 2014-05-11 DIAGNOSIS — N322 Vesical fistula, not elsewhere classified: Secondary | ICD-10-CM

## 2014-05-11 DIAGNOSIS — Z7901 Long term (current) use of anticoagulants: Secondary | ICD-10-CM

## 2014-05-11 DIAGNOSIS — K5730 Diverticulosis of large intestine without perforation or abscess without bleeding: Secondary | ICD-10-CM

## 2014-05-11 DIAGNOSIS — I729 Aneurysm of unspecified site: Secondary | ICD-10-CM

## 2014-05-11 NOTE — Progress Notes (Signed)
Subjective:    Patient ID: Douglas Ballard, male    DOB: Feb 06, 1951, 63 y.o.   MRN: [id number]  HPI  Douglas Ballard is a pleasant 62 year old white male referred today by Dr. Johney Maine for colonoscopy. Patient was hospitalized 826 2 04/23/2014 with sepsis most consistent with a urosepsis. Hospitalization was complicated by alcohol withdrawal and atrial fibrillation. About 4 months ago patient had begun having pneumaturia. He does have history of prostate cancer and had a prostatectomy about 3 years ago did not receive any radiation. Cystoscopy was done and showed inflammation at the left dome of his bladder and then CT scan at that time showed gas in the bladder and the colon with diverticulosis and therefore very suspicious for colovesicular fistula. Repeat CT scan was done on 04/15/2014 with similar findings in no active diverticulitis. Patient had had a prior colonoscopy done per Dr. Collene Mares ,6 or 7 years ago but did not wish to be evaluated by her. He was aware that he had diverticulosis but had never had diverticulitis to his knowledge. He is now recovering from his  recent illness and has also developed a cellulitis from a fall since discharge from the hospital. His PCP is Dr. Elease Hashimoto, and he is currently on Levaquin. He had been evaluated by Dr. Martinique for cardiology while hospitalized and is on Xarelto. . During that hospitalization he also had 3 episodes of expressive aphasia consistent with TIA -MRI was negative for CVA. He has no current complaints of abdominal pain no fever or chills. His bowel movements are good has not noted any melena or hematochezia. He has been taking a fiber supplement. He has no complaints of dysuria. He is still having the pneumaturia. His surgery for partial colectomy and partial cystectomy is tentatively scheduled for 06/15/2014. Patient would like to coordinate colonoscopy to be done just prior to his surgery    Review of Systems  Constitutional: Positive for fatigue.    HENT: Negative.   Respiratory: Negative.   Cardiovascular: Negative.   Gastrointestinal: Negative.   Endocrine: Negative.   Genitourinary: Positive for dysuria. Negative for enuresis.  Musculoskeletal: Negative.   Skin: Negative.   Allergic/Immunologic: Negative.   Neurological: Negative.   Hematological: Negative.   Psychiatric/Behavioral: Negative.    Outpatient Prescriptions Prior to Visit  Medication Sig Dispense Refill  . diltiazem (CARDIZEM CD) 240 MG 24 hr capsule Take 1 capsule (240 mg total) by mouth daily.  30 capsule  1  . feeding supplement, GLUCERNA SHAKE, (GLUCERNA SHAKE) LIQD Take 237 mLs by mouth 2 (two) times daily between meals.  237 mL  10  . glipiZIDE (GLUCOTROL) 5 MG tablet Take 0.5 tablets (2.5 mg total) by mouth daily before breakfast.  30 tablet  0  . isosorbide-hydrALAZINE (BIDIL) 20-37.5 MG per tablet Take 1 tablet by mouth 2 (two) times daily.  60 tablet  1  . labetalol (NORMODYNE) 300 MG tablet Take 1 tablet (300 mg total) by mouth 3 (three) times daily.  90 tablet  1  . Multiple Vitamins-Minerals (MULTIVITAMINS THER. W/MINERALS) TABS Take 1 tablet by mouth daily.        . rivaroxaban (XARELTO) 20 MG TABS tablet Take 1 tablet (20 mg total) by mouth daily with supper.  30 tablet  1  . tamsulosin (FLOMAX) 0.4 MG CAPS capsule Take 1 capsule (0.4 mg total) by mouth daily after supper.  30 capsule  1  . UNABLE TO FIND Mr. Jacoby Matsuno was hospitalized from 04/14/14 until 04/23/14.  His wife was present  during the entire hospitalization to assist in the care of the patient.  1 Act  0  . zolpidem (AMBIEN) 5 MG tablet Take 5 mg by mouth at bedtime as needed for sleep.       . furosemide (LASIX) 20 MG tablet Take 20 mg by mouth daily.       Marland Kitchen levofloxacin (LEVAQUIN) 750 MG tablet Take 1 tablet (750 mg total) by mouth daily.  8 tablet  0  . potassium chloride SA (K-DUR,KLOR-CON) 20 MEQ tablet Take 2 tablets (40 mEq total) by mouth daily.  30 tablet  0   No  facility-administered medications prior to visit.   Allergies  Allergen Reactions  . Hydrochlorothiazide     Pt gets hyponatremia  . Lasix [Furosemide]     Sodium levels drop when take  . Nsaids     Kidney disease/failure  . Sulfa Antibiotics Other (See Comments)    headaches   Patient Active Problem List   Diagnosis Date Noted  . Sepsis due to urinary tract infection 04/16/2014  . Sepsis 04/14/2014  . Urinary tract infection 04/14/2014  . Elevated bilirubin 04/14/2014  . 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 Pneumaturia 03/24/2014  . Personal history of colonic polyps 03/24/2014  . Alcohol abuse 06/27/2011  . Tobacco abuse 06/27/2011  . HTN (hypertension) 06/26/2011  . Personal history of prostate cancer s/p prostatectomy 2012 06/26/2011  . Hyponatremia 06/26/2011   History  Substance Use Topics  . Smoking status: Former Smoker -- 2.00 packs/day for 45 years    Types: Cigarettes    Quit date: 04/14/2014  . Smokeless tobacco: Never Used  . Alcohol Use: No   family history includes Atrial fibrillation in his father; Breast cancer in his mother; Colon polyps in his brother; Leukemia in his brother; Lung cancer in his mother; Prostate cancer in his father. There is no history of Colon cancer or Diabetes.     Objective:   Physical Exam  well-developed white male in no acute distress, accompanied by his wife blood pressure 130/56 pulse 88 height 5 foot 7 weight 168. 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 Patient is scheduled for partial colectomy and tarsal cystectomy tentatively 06/15/2014 per Dr. Johney Maine. Preop colonoscopy is requested Patient currently asymptomatic other than pneumaturia #2 new diagnosis of atrial fibrillation-occurred during acute illness and alcohol withdrawal #3 TIA-while hospitalized 3 weeks ago-on Xarelto #4 history of prostate cancer status post prostatectomy 2012 no radiation #5 hypertension #6 history of colon polyps  Plan; Will discuss with Dr. Carlean Purl who is covering the hospital for GI the week of this patient's surgery.  May be best for this patient to be admitted 2 days prior to surgery to undergo bowel prep and then colonoscopy the day prior to surgery. This would also allow him to stay on Xarelto uninterrupted until that time. Patient and wife verbalized we will call them regarding timing of colonoscopy once the details are worked out.

## 2014-05-11 NOTE — Patient Instructions (Signed)
Amy Esterwood PA-C will be coordinate with Dr. Johney Maine regarding scheduling the colonoscopy while you are admitted for your Sigmoid Cholectomy with Dr. Johney Maine.  We will call you with details.

## 2014-05-12 ENCOUNTER — Telehealth: Payer: Self-pay | Admitting: *Deleted

## 2014-05-12 ENCOUNTER — Other Ambulatory Visit: Payer: Self-pay | Admitting: *Deleted

## 2014-05-12 ENCOUNTER — Encounter (INDEPENDENT_AMBULATORY_CARE_PROVIDER_SITE_OTHER): Payer: Self-pay

## 2014-05-12 DIAGNOSIS — N321 Vesicointestinal fistula: Secondary | ICD-10-CM

## 2014-05-12 DIAGNOSIS — Z86010 Personal history of colonic polyps: Secondary | ICD-10-CM

## 2014-05-12 NOTE — Telephone Encounter (Signed)
Spoke with Douglas Ballard at [employer].  She works with Dr. Johney Maine. This patient's Surgery- Sigmoid, Cholectomy, is scheduled for 06-15-14 and the colonoscopy scheduled for 10-26 with Dr Carlean Purl at [place].  Per Amy Esterwood PA-C , we didn't want to admit him on Sunday , 9-25, but wanted to know if Dr. Johney Maine wanted to , so the patient can prep.  If they don't want to admit him we will instruct the patient on prepping at home on Sun and Monday Am.  Lars Mage said she will call me back after she talks to Dr. Johney Maine.

## 2014-05-17 ENCOUNTER — Telehealth: Payer: Self-pay | Admitting: *Deleted

## 2014-05-17 ENCOUNTER — Other Ambulatory Visit: Payer: Self-pay | Admitting: *Deleted

## 2014-05-17 ENCOUNTER — Other Ambulatory Visit: Payer: Self-pay | Admitting: Urology

## 2014-05-17 NOTE — Telephone Encounter (Signed)
05/17/2014   RE: Douglas Ballard DOB: 1951-06-27 MRN: [id number]   Dear Dr. Peter Martinique,    We have scheduled the above patient for an endoscopic procedure. Our records show that he is on anticoagulation therapy.   Please advise as to how long the patient may come off his therapy of Xarelto prior to the procedure, which is scheduled for 06-15-2014. We normally recommend the patient hold this medication for 48 hours unless instructed otherwise.  Please fax back/ or route the completed form to [facility] at [phone].   Sincerely,    Amy Esterwood PA-C     Marisue Humble CMA

## 2014-05-17 NOTE — Telephone Encounter (Signed)
He may stop Xarelto 72 hours prior to colonoscopy.  Bannon Giammarco Martinique MD, [facility]

## 2014-05-17 NOTE — Telephone Encounter (Signed)
Message copied by Tonette Bihari on Mon May 17, 2014 11:49 AM ------      Message from: Nicoletta Ba S      Created: Mon May 17, 2014 11:14 AM       OK-[facility] is admitting on Sunday for bowel prep,colon scheduled for Monday,will stop Xarelto 24 hours before colon.Durene Cal letter to cardiologist            Jeannene Patella -make sure colon is scheduled  For Monday Oct 26 with dr Carlean Purl- thanks      ----- Message -----         From: Gatha Mayer, MD         Sent: 05/12/2014  11:58 AM           To: Alfredia Ferguson, PA-C            I will discuss w/ Richardson Landry            I do not mind doing on a Monday - [facility] may need to admit on a Sun      Will sort out Xarelto issues      ----- Message -----         From: Alfredia Ferguson, PA-C         Sent: 05/11/2014  12:12 PM           To: Gatha Mayer, MD            Glendell Docker- gave this pt to you, as you are hospiatl doc the week of his planned surgery-  Unfortunately surgery scheduled for a Tuesday- I think he could prep at home, have colon Monday then be admitted for surgery Tuesday- He had a recent TIA and  Would like him not to go off and on Xarelto if we can help it.. Do you want to discuss with  Dr. Johney Maine?             ------

## 2014-05-18 ENCOUNTER — Encounter (INDEPENDENT_AMBULATORY_CARE_PROVIDER_SITE_OTHER): Payer: Self-pay

## 2014-05-19 ENCOUNTER — Encounter: Payer: Self-pay | Admitting: *Deleted

## 2014-05-19 ENCOUNTER — Encounter: Payer: Self-pay | Admitting: Physician Assistant

## 2014-05-19 ENCOUNTER — Ambulatory Visit (INDEPENDENT_AMBULATORY_CARE_PROVIDER_SITE_OTHER): Payer: BC Managed Care – PPO | Admitting: Physician Assistant

## 2014-05-19 VITALS — BP 112/60 | HR 67 | Ht 67.5 in | Wt 169.6 lb

## 2014-05-19 DIAGNOSIS — F17200 Nicotine dependence, unspecified, uncomplicated: Secondary | ICD-10-CM

## 2014-05-19 DIAGNOSIS — N321 Vesicointestinal fistula: Secondary | ICD-10-CM

## 2014-05-19 DIAGNOSIS — Z720 Tobacco use: Secondary | ICD-10-CM

## 2014-05-19 DIAGNOSIS — I480 Paroxysmal atrial fibrillation: Secondary | ICD-10-CM | POA: Insufficient documentation

## 2014-05-19 DIAGNOSIS — I4891 Unspecified atrial fibrillation: Secondary | ICD-10-CM

## 2014-05-19 DIAGNOSIS — F1010 Alcohol abuse, uncomplicated: Secondary | ICD-10-CM

## 2014-05-19 DIAGNOSIS — Z01818 Encounter for other preprocedural examination: Secondary | ICD-10-CM | POA: Insufficient documentation

## 2014-05-19 DIAGNOSIS — I10 Essential (primary) hypertension: Secondary | ICD-10-CM

## 2014-05-19 NOTE — Assessment & Plan Note (Signed)
He reports that he quit since his last hospitalization in early September.

## 2014-05-19 NOTE — Progress Notes (Signed)
Date:  05/19/2014   ID:  [name], DOB 1950/10/14, MRN [id number]  PCP:  Stephens Shire, MD  Primary Cardiologist:  Martinique     History of Present Illness: Douglas Ballard is a 63 y.o. caucasian male with past medical history of hypertension, history of prostate cancer status post prostatectomy, newly diagnosed colovesicular fistula, diabetes, alcohol abuse, and 90 pack year tobacco abuse.  He drinks a lot of coffee. He had no prior history of palpitation until recently. He states he was diagnosed with colovesical fistula in early August and plan to have a colonoscopy in September prior to surgical surgical repair.  He was admitted to [facility] from 04/14/2014 to 04/23/2014 with acute renal failure, sepsis with bacteremia, expressive aphasia, acute diastolic heart failure, alcohol abuse with withdrawal and delirium and new onset atrial fibrillation. MRI of the head was negative for acute process.  Patient spontaneously converted to normal sinus rhythm while on Cardizem and labetalol. These were continued in by mouth format.  He is also taking Xarelto.  Echocardiogram revealed an ejection fraction of 50-55%, mofderate left and right atrium dilation, mild LVH.    Patient presents today for post hospital evaluation and preoperative clearance. He reports doing quite well. He has not had any alcohol or tobacco since his discharge.  He denies nausea, vomiting, fever, chest pain, shortness of breath, orthopnea, dizziness, PND, cough, congestion, abdominal pain, hematochezia, melena, lower extremity edema, claudication.   He is taking all medications as prescribed and his weight is stable. Reports no family history of heart disease. His father lived to be 40 years old.  He is scheduled for sigmoid colectomy on October 27.    Wt Readings from Last 3 Encounters:  05/19/14 169 lb 9.6 oz (76.93 kg)  05/11/14 168 lb 8 oz (76.431 kg)  04/23/14 168 lb 8 oz (76.431 kg)     Past Medical History   Diagnosis Date  . Hypertension   . Cancer     Prostate surgery  . Headache(784.0)   . Pneumonia   . Diabetes mellitus   . 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   Marland Kitchen PNA (pneumonia) 06/26/2011  . Compression fracture of thoracic vertebra 06/26/2011  . Fistula, bladder   . Colon polyps     Current Outpatient Prescriptions  Medication Sig Dispense Refill  . clonazePAM (KLONOPIN) 0.5 MG tablet Take 0.5 mg by mouth 2 (two) times daily as needed for anxiety.      Marland Kitchen diltiazem (CARDIZEM CD) 240 MG 24 hr capsule Take 1 capsule (240 mg total) by mouth daily.  30 capsule  1  . feeding supplement, GLUCERNA SHAKE, (GLUCERNA SHAKE) LIQD Take 237 mLs by mouth 2 (two) times daily between meals.  237 mL  10  . glipiZIDE (GLUCOTROL) 5 MG tablet Take 0.5 tablets (2.5 mg total) by mouth daily before breakfast.  30 tablet  0  . isosorbide-hydrALAZINE (BIDIL) 20-37.5 MG per tablet Take 1 tablet by mouth 2 (two) times daily.  60 tablet  1  . labetalol (NORMODYNE) 300 MG tablet Take 1 tablet (300 mg total) by mouth 3 (three) times daily.  90 tablet  1  . levofloxacin (LEVAQUIN) 500 MG tablet Take 1 tablet by mouth daily.      . Multiple Vitamins-Minerals (MULTIVITAMINS THER. W/MINERALS) TABS Take 1 tablet by mouth daily.        Marland Kitchen  rivaroxaban (XARELTO) 20 MG TABS tablet Take 1 tablet (20 mg total) by mouth daily with supper.  30 tablet  1  . tamsulosin (FLOMAX) 0.4 MG CAPS capsule Take 1 capsule (0.4 mg total) by mouth daily after supper.  30 capsule  1  . UNABLE TO FIND Douglas Ballard was hospitalized from 04/14/14 until 04/23/14.  His wife was present during the entire hospitalization to assist in the care of the patient.  1 Act  0  . zolpidem (AMBIEN) 5 MG tablet Take 5 mg by mouth at bedtime as needed for sleep.        No current facility-administered  medications for this visit.    Allergies:    Allergies  Allergen Reactions  . Hydrochlorothiazide     Pt gets hyponatremia  . Lasix [Furosemide]     Sodium levels drop when take  . Nsaids     Kidney disease/failure  . Sulfa Antibiotics Other (See Comments)    headaches    Social History:  The patient  reports that he quit smoking about 5 weeks ago. His smoking use included Cigarettes. He has a 90 pack-year smoking history. He has never used smokeless tobacco. He reports that he does not drink alcohol or use illicit drugs.   Family history:   Family History  Problem Relation Age of Onset  . Atrial fibrillation Father     onset 79s. Had pacemaker placed for sinus pause  . Prostate cancer Father   . Breast cancer Mother   . Lung cancer Mother   . Colon cancer Neg Hx   . Leukemia Brother   . Colon polyps Brother   . Diabetes Neg Hx     ROS:  Please see the history of present illness.  All other systems reviewed and negative.   PHYSICAL EXAM: VS:  BP 112/60  Pulse 67  Ht 5' 7.5" (1.715 m)  Wt 169 lb 9.6 oz (76.93 kg)  BMI 26.16 kg/m2 Well nourished, well developed, in no acute distress HEENT: Pupils are equal round react to light accommodation extraocular movements are intact.  Neck: no JVDNo cervical lymphadenopathy. Cardiac: Regular rate and rhythm without murmurs rubs or gallops. Lungs:  clear to auscultation bilaterally, no wheezing, rhonchi or rales Abd: soft, nontender, positive bowel sounds all quadrants, no hepatosplenomegaly Ext: no lower extremity edema.  2+ radial and dorsalis pedis pulses. Skin: warm and dry Neuro:  Grossly normal  EKG:  First degree AV block. 67 beats per minute    ASSESSMENT AND PLAN:  Problem List Items Addressed This Visit   Alcohol abuse (Chronic)     He reports no alcohol in the last 3 weeks.    Colovesical fistula     Surgery planned for Jun 15, 2014    HTN (hypertension) - Primary     Blood pressure well controlled.  No  changes.    Relevant Orders      EKG 12-Lead      Myocardial Perfusion Imaging   Paroxysmal atrial fibrillation     Currently maintaining sinus rhythm with first degree AV block rate of 67 beats per minute.  Cardizem 240, labetalol, and Xarelto.    Preoperative clearance     Given extensive tobacco history and new onset A. fib, will obtain Lexiscan stress test prior to clearing for surgery.    Tobacco abuse     He reports that he quit since his last hospitalization in early September.     Other Visit Diagnoses  Pre-op examination        Relevant Orders       EKG 12-Lead       Myocardial Perfusion Imaging

## 2014-05-19 NOTE — Assessment & Plan Note (Addendum)
Surgery planned for Jun 15, 2014

## 2014-05-19 NOTE — Patient Instructions (Signed)
Your physician recommends that you schedule a follow-up appointment in: 3 MONTHS WITH DR Martinique  Your physician has requested that you have a lexiscan myoview. For further information please visit [email or web address]. Please follow instruction sheet, as given.

## 2014-05-19 NOTE — Assessment & Plan Note (Signed)
Given extensive tobacco history and new onset A. fib, will obtain Lexiscan stress test prior to clearing for surgery.

## 2014-05-19 NOTE — Assessment & Plan Note (Signed)
Currently maintaining sinus rhythm with first degree AV block rate of 67 beats per minute.  Cardizem 240, labetalol, and Xarelto.

## 2014-05-19 NOTE — Progress Notes (Signed)
Agree with [name]'s assessment and plan. Carl E. Gessner, MD, FACG   

## 2014-05-19 NOTE — Assessment & Plan Note (Signed)
He reports no alcohol in the last 3 weeks.

## 2014-05-19 NOTE — Assessment & Plan Note (Addendum)
Blood pressure well controlled.  No changes.

## 2014-05-20 NOTE — Telephone Encounter (Signed)
Dr.Jordan advised ok to hold Xarelto 72 hrs prior to colonoscopy.

## 2014-05-26 ENCOUNTER — Telehealth (HOSPITAL_COMMUNITY): Payer: Self-pay

## 2014-05-26 NOTE — Telephone Encounter (Signed)
Encounter complete. 

## 2014-05-28 ENCOUNTER — Ambulatory Visit (HOSPITAL_COMMUNITY)
Admission: RE | Admit: 2014-05-28 | Discharge: 2014-05-28 | Disposition: A | Discharge status: Home / Self Care | Payer: BC Managed Care – PPO | Source: Ambulatory Visit | Attending: Cardiology | Admitting: Cardiology

## 2014-05-28 ENCOUNTER — Telehealth: Payer: Self-pay | Admitting: *Deleted

## 2014-05-28 ENCOUNTER — Telehealth: Payer: Self-pay | Admitting: Cardiology

## 2014-05-28 DIAGNOSIS — Z01818 Encounter for other preprocedural examination: Secondary | ICD-10-CM

## 2014-05-28 DIAGNOSIS — E119 Type 2 diabetes mellitus without complications: Secondary | ICD-10-CM | POA: Diagnosis not present

## 2014-05-28 DIAGNOSIS — R002 Palpitations: Secondary | ICD-10-CM | POA: Diagnosis present

## 2014-05-28 DIAGNOSIS — I10 Essential (primary) hypertension: Secondary | ICD-10-CM | POA: Diagnosis not present

## 2014-05-28 DIAGNOSIS — Z87891 Personal history of nicotine dependence: Secondary | ICD-10-CM | POA: Insufficient documentation

## 2014-05-28 DIAGNOSIS — Z01810 Encounter for preprocedural cardiovascular examination: Secondary | ICD-10-CM

## 2014-05-28 DIAGNOSIS — Z8249 Family history of ischemic heart disease and other diseases of the circulatory system: Secondary | ICD-10-CM | POA: Insufficient documentation

## 2014-05-28 MED ORDER — TECHNETIUM TC 99M SESTAMIBI GENERIC - CARDIOLITE
10.4000 | Freq: Once | INTRAVENOUS | Status: AC | PRN
  Administered 2014-05-28: 10 via INTRAVENOUS

## 2014-05-28 MED ORDER — REGADENOSON 0.4 MG/5ML IV SOLN
0.4000 mg | Freq: Once | INTRAVENOUS | Status: AC
  Administered 2014-05-28: 0.4 mg via INTRAVENOUS

## 2014-05-28 MED ORDER — MOVIPREP 100 G PO SOLR: 1.0000 | ORAL | Status: DC

## 2014-05-28 MED ORDER — TECHNETIUM TC 99M SESTAMIBI GENERIC - CARDIOLITE
29.6000 | Freq: Once | INTRAVENOUS | Status: AC | PRN
  Administered 2014-05-28: 30 via INTRAVENOUS

## 2014-05-28 NOTE — Telephone Encounter (Signed)
Ignatuis Guidone called to tell me her husband was nervous about a stress test he is having done as we were speaking. She sort of apologized for him. She also spoke to [facility], Dr. Clyda Greener assistant earlier and her husband knows now about prepping at home on Sun 10-25 and going to [facility] Endo unit on Monday.  I told her he has to be there at 9:00 am .  I told her I called the numbers in for the Moviprep to [facility] and they can pick that up once they come in Mon 05-31-2014.  She thanked me for doing that.  I told her to have him come in anytime next week except Thursday and I will have him sign paperwork and I will explain the Moviprep instructions for the colonoscopy.

## 2014-05-28 NOTE — Telephone Encounter (Signed)
Called the patient to inform him about the xarelto. He did not give me a chance to do this.  I mentioned him prepping on Sun possibly at the hospital. He started yelling and said I need to get my ducks in a row.  He said he was told by [facility] and [facility] that he would prep at home.  I told him I was trying explain the last bit of information I got on 9-28. He told me to check my facts and get it straight before I call him again.  He hung up.

## 2014-05-28 NOTE — Telephone Encounter (Signed)
Douglas Ballard is calling because she needs the cardiac clearance as well as if he can hold [name] for 3 days . Having a Colonoscopy on 10/26 and Surgery on the 06/15/14.Marland Kitchen Please call    Thanks

## 2014-05-28 NOTE — Telephone Encounter (Signed)
Called and LM for Alesha @ [facility] to please call me back.

## 2014-05-28 NOTE — Procedures (Addendum)
[place] [facility] [address] [place] [id number]  Cardiology Nuclear Med Study  Arbor Bolosan is a 63 y.o. male     MRN : [id number]     DOB: [birth year]  Procedure Date: 05/28/2014  Nuclear Med Background Indication for Stress Test:  Surgical Clearance and [facility] History:  PAF;Diastolic heart failure;No prior NUC MPI for comparison;ECHO on 04/16/2014-LVEF=50-55% Cardiac Risk Factors: Family History - CAD, History of Smoking, Hypertension and NIDDM  Symptoms:  Palpitations   Nuclear Pre-Procedure Caffeine/Decaff Intake:  1:00am NPO After: 11am   IV Site: R Forearm  IV 0.9% NS with Angio Cath:  22g  Chest Size (in):  42"  IV Started by: Rolene Course, RN  Height: 5\' 8"  (1.727 m)  Cup Size: n/a  BMI:  Body mass index is 25.7 kg/(m^2). Weight:  169 lb (76.658 kg)   Tech Comments:  n/a    Nuclear Med Study 1 or 2 day study: 1 day  Stress Test Type:  [place] Provider:  Peter Martinique, MD   Resting Radionuclide: Technetium 70m Sestamibi  Resting Radionuclide Dose: 10.4 mCi   Stress Radionuclide:  Technetium 30m Sestamibi  Stress Radionuclide Dose: 29.6 mCi           Stress Protocol Rest HR: 63 Stress HR: 73  Rest BP: 136/88 Stress BP: 138/91  Exercise Time (min): n/a METS: n/a   Predicted Max HR: 158 bpm % Max HR: 51.27 bpm Rate Pressure Product: 11988  Dose of Adenosine (mg):  n/a Dose of Lexiscan: 0.4 mg  Dose of Atropine (mg): n/a Dose of Dobutamine: n/a mcg/kg/min (at max HR)  Stress Test Technologist: Leane Para, CCT Nuclear Technologist: Imagene Riches, CNMT   Rest Procedure:  Myocardial perfusion imaging was performed at rest 45 minutes following the intravenous administration of Technetium 72m Sestamibi. Stress Procedure:  The patient received IV Lexiscan 0.4 mg over 15-seconds.  Technetium 61m Sestamibi injected at 30-seconds.  There were no significant changes with  Lexiscan.  Quantitative spect images were obtained after a 45 minute delay.  Transient Ischemic Dilatation (Normal <1.22):  1.09  QGS EDV:  166 ml QGS ESV:  83 ml LV Ejection Fraction: 50%     Rest ECG: NSR with non-specific ST-T wave changes  Stress ECG: No significant change from baseline ECG  QPS Raw Data Images:  Normal; no motion artifact; normal heart/lung ratio. Stress Images:  Normal homogeneous uptake in all areas of the myocardium. Rest Images:  Normal homogeneous uptake in all areas of the myocardium. Subtraction (SDS):  Normal  Impression Exercise Capacity:  Lexiscan with no exercise. BP Response:  Normal blood pressure response. Clinical Symptoms:  Mild dyspnea ECG Impression:  No significant ST segment change suggestive of ischemia. Comparison with Prior Nuclear Study: No previous nuclear study performed  Overall Impression:  Low risk stress nuclear study demonstrating mild baseline ST-T changes with normal myocardial perfusion and low normal EF of 50%.  LV Wall Motion:  Low normal LV Function, EF 50%; NL Wall Motion   Shelva Majestic A, MD  05/28/2014 5:22 PM

## 2014-05-28 NOTE — Telephone Encounter (Signed)
[place] surgery already closed.Dr.Jordan advised ok to hold xarelto 3 days prior to surgery.Message sent to Delsa Grana.

## 2014-05-28 NOTE — Telephone Encounter (Signed)
I spoke to [facility] at [place] and we discussed this patient's plan for his colonoscopy and surgery.  She said they cannot admit him early for his prep for insurance reasons.  He will prep at home on Sun 10-25 and Mon 10-26 will have the colonoscopy at [facility] Endo unit.  I told her I will not make him have a presvisit. He can come here and I will be glad to explain the procedure instructions to him. I will see about getting him a prep as well.  Elna Breslow is calling him today to let him know this.

## 2014-05-28 NOTE — Telephone Encounter (Signed)
Message deferred to Elly Modena, LPN

## 2014-05-28 NOTE — Telephone Encounter (Signed)
I called the patient and left a message for him to please call me.  I advised I called [facility] with the numbers from the free Moviprep voucher. He can pick up his prep Monday 10-12.  ( [facility]  He won't have to have a previsit but can come in here at his convenience to sign his paperwork for the colonoscopy and I will be glad to explain his instructions.

## 2014-06-01 ENCOUNTER — Telehealth: Payer: Self-pay | Admitting: Cardiology

## 2014-06-01 NOTE — Telephone Encounter (Signed)
Pt called in wanting to know the results to his myocardial perfusion that he had on 10/9. Please call Thanks

## 2014-06-01 NOTE — Telephone Encounter (Signed)
Left message of results for pt  Forwarded to dr gross for clearance

## 2014-06-04 ENCOUNTER — Telehealth: Payer: Self-pay | Admitting: *Deleted

## 2014-06-04 NOTE — Telephone Encounter (Signed)
Called Douglas Ballard, and left a message for her to call me.

## 2014-06-08 ENCOUNTER — Other Ambulatory Visit (HOSPITAL_COMMUNITY): Payer: BC Managed Care – PPO

## 2014-06-09 ENCOUNTER — Encounter (HOSPITAL_COMMUNITY): Payer: Self-pay | Admitting: Pharmacy Technician

## 2014-06-09 ENCOUNTER — Inpatient Hospital Stay (HOSPITAL_COMMUNITY)
Admission: RE | Admit: 2014-06-09 | Discharge: 2014-06-09 | Disposition: A | Discharge status: Home / Self Care | Payer: BC Managed Care – PPO | Source: Ambulatory Visit

## 2014-06-09 ENCOUNTER — Other Ambulatory Visit (INDEPENDENT_AMBULATORY_CARE_PROVIDER_SITE_OTHER): Payer: Self-pay | Admitting: Surgery

## 2014-06-09 NOTE — Progress Notes (Signed)
Orders placed again

## 2014-06-09 NOTE — Patient Instructions (Signed)
Douglas Ballard  06/09/2014   Your procedure is scheduled on:  06/15/2014    Come thru the Emergency Room entrance. Follow the Signs to [place] at   [address]     am  Call this number if you have problems the morning of surgery: [phone]   Remember:   Do not eat food or drink liquids after midnight.   Take these medicines the morning of surgery with A SIP OF WATER:    Do not wear jewelry,   Do not wear lotions, powders, or perfumes.  deodorant.  . Men may shave face and neck.  Do not bring valuables to the hospital.  Contacts, dentures or bridgework may not be worn into surgery.  Leave suitcase in the car. After surgery it may be brought to your room.  For patients admitted to the hospital, checkout time is 11:00 AM the day of  discharge.          Please read over the following fact sheets that you were given:[facility] - Preparing for Surgery Before surgery, you can play an important role.  Because skin is not sterile, your skin needs to be as free of germs as possible.  You can reduce the number of germs on your skin by washing with CHG (chlorahexidine gluconate) soap before surgery.  CHG is an antiseptic cleaner which kills germs and bonds with the skin to continue killing germs even after washing. Please DO NOT use if you have an allergy to CHG or antibacterial soaps.  If your skin becomes reddened/irritated stop using the CHG and inform your nurse when you arrive at Short Stay. Do not shave (including legs and underarms) for at least 48 hours prior to the first CHG shower.  You may shave your face/neck. Please follow these instructions carefully:  1.  Shower with CHG Soap the night before surgery and the  morning of Surgery.  2.  If you choose to wash your hair, wash your hair first as usual with your  normal  shampoo.  3.  After you shampoo, rinse your hair and body thoroughly to remove the  shampoo.                           4.  Use CHG as you would any other liquid soap.   You can apply chg directly  to the skin and wash                       Gently with a scrungie or clean washcloth.  5.  Apply the CHG Soap to your body ONLY FROM THE NECK DOWN.   Do not use on face/ open                           Wound or open sores. Avoid contact with eyes, ears mouth and genitals (private parts).                       Wash face,  Genitals (private parts) with your normal soap.             6.  Wash thoroughly, paying special attention to the area where your surgery  will be performed.  7.  Thoroughly rinse your body with warm water from the neck down.  8.  DO NOT shower/wash with your normal soap after using and rinsing off  the  CHG Soap.                9.  Pat yourself dry with a clean towel.            10.  Wear clean pajamas.            11.  Place clean sheets on your bed the night of your first shower and do not  sleep with pets. Day of Surgery : Do not apply any lotions/deodorants the morning of surgery.  Please wear clean clothes to the hospital/surgery center.  FAILURE TO FOLLOW THESE INSTRUCTIONS MAY RESULT IN THE CANCELLATION OF YOUR SURGERY PATIENT SIGNATURE_________________________________  NURSE SIGNATURE__________________________________  ________________________________________________________________________  WHAT IS A BLOOD TRANSFUSION? Blood Transfusion Information  A transfusion is the replacement of blood or some of its parts. Blood is made up of multiple cells which provide different functions.  Red blood cells carry oxygen and are used for blood loss replacement.  White blood cells fight against infection.  Platelets control bleeding.  Plasma helps clot blood.  Other blood products are available for specialized needs, such as hemophilia or other clotting disorders. BEFORE THE TRANSFUSION  Who gives blood for transfusions?   Healthy volunteers who are fully evaluated to make sure their blood is safe. This is blood bank blood. Transfusion  therapy is the safest it has ever been in the practice of medicine. Before blood is taken from a donor, a complete history is taken to make sure that person has no history of diseases nor engages in risky social behavior (examples are intravenous drug use or sexual activity with multiple partners). The donor's travel history is screened to minimize risk of transmitting infections, such as malaria. The donated blood is tested for signs of infectious diseases, such as HIV and hepatitis. The blood is then tested to be sure it is compatible with you in order to minimize the chance of a transfusion reaction. If you or a relative donates blood, this is often done in anticipation of surgery and is not appropriate for emergency situations. It takes many days to process the donated blood. RISKS AND COMPLICATIONS Although transfusion therapy is very safe and saves many lives, the main dangers of transfusion include:   Getting an infectious disease.  Developing a transfusion reaction. This is an allergic reaction to something in the blood you were given. Every precaution is taken to prevent this. The decision to have a blood transfusion has been considered carefully by your caregiver before blood is given. Blood is not given unless the benefits outweigh the risks. AFTER THE TRANSFUSION  Right after receiving a blood transfusion, you will usually feel much better and more energetic. This is especially true if your red blood cells have gotten low (anemic). The transfusion raises the level of the red blood cells which carry oxygen, and this usually causes an energy increase.  The nurse administering the transfusion will monitor you carefully for complications. HOME CARE INSTRUCTIONS  No special instructions are needed after a transfusion. You may find your energy is better. Speak with your caregiver about any limitations on activity for underlying diseases you may have. SEEK MEDICAL CARE IF:   Your condition is  not improving after your transfusion.  You develop redness or irritation at the intravenous (IV) site. SEEK IMMEDIATE MEDICAL CARE IF:  Any of the following symptoms occur over the next 12 hours:  Shaking chills.  You have a temperature by mouth above 102 F (38.9 C), not controlled by medicine.  Chest, back, or muscle pain.  People around you feel you are not acting correctly or are confused.  Shortness of breath or difficulty breathing.  Dizziness and fainting.  You get a rash or develop hives.  You have a decrease in urine output.  Your urine turns a dark color or changes to pink, red, or brown. Any of the following symptoms occur over the next 10 days:  You have a temperature by mouth above 102 F (38.9 C), not controlled by medicine.  Shortness of breath.  Weakness after normal activity.  The white part of the eye turns yellow (jaundice).  You have a decrease in the amount of urine or are urinating less often.  Your urine turns a dark color or changes to pink, red, or brown. Document Released: 08/03/2000 Document Revised: 10/29/2011 Document Reviewed: 03/22/2008 [employer] Patient Information 2014 [place].  _______________________________________________________________________ coughing and deep breathing exercises, leg exercises

## 2014-06-09 NOTE — Progress Notes (Signed)
Need orders in EPIC.  Patient for surgery on 06/15/2014.  Preop appt on 06/09/14 at 100pm.  Thank You.

## 2014-06-10 ENCOUNTER — Ambulatory Visit (HOSPITAL_COMMUNITY)
Admission: RE | Admit: 2014-06-10 | Discharge: 2014-06-10 | Disposition: A | Discharge status: Home / Self Care | Payer: BC Managed Care – PPO | Source: Ambulatory Visit | Attending: Anesthesiology | Admitting: Anesthesiology

## 2014-06-10 ENCOUNTER — Encounter (HOSPITAL_COMMUNITY)
Admission: RE | Admit: 2014-06-10 | Discharge: 2014-06-10 | Disposition: A | Discharge status: Home / Self Care | Payer: BC Managed Care – PPO | Source: Ambulatory Visit | Attending: General Surgery | Admitting: General Surgery

## 2014-06-10 ENCOUNTER — Other Ambulatory Visit (HOSPITAL_COMMUNITY): Payer: Self-pay | Admitting: *Deleted

## 2014-06-10 ENCOUNTER — Encounter (HOSPITAL_COMMUNITY): Payer: Self-pay

## 2014-06-10 VITALS — BP 113/57 | HR 70 | Temp 98.3°F | Resp 18 | Ht 69.0 in | Wt 170.5 lb

## 2014-06-10 DIAGNOSIS — I10 Essential (primary) hypertension: Secondary | ICD-10-CM | POA: Diagnosis present

## 2014-06-10 DIAGNOSIS — R9389 Abnormal findings on diagnostic imaging of other specified body structures: Secondary | ICD-10-CM

## 2014-06-10 HISTORY — DX: Unspecified atrial fibrillation: I48.91

## 2014-06-10 HISTORY — DX: Cerebral infarction, unspecified: I63.9

## 2014-06-10 LAB — CBC
HCT: 30.2 % — ABNORMAL LOW (ref 39.0–52.0)
HEMOGLOBIN: 10.8 g/dL — AB (ref 13.0–17.0)
MCH: 33.3 pg (ref 26.0–34.0)
MCHC: 35.8 g/dL (ref 30.0–36.0)
MCV: 93.2 fL (ref 78.0–100.0)
PLATELETS: 279 10*3/uL (ref 150–400)
RBC: 3.24 MIL/uL — ABNORMAL LOW (ref 4.22–5.81)
RDW: 11.8 % (ref 11.5–15.5)
WBC: 7.7 10*3/uL (ref 4.0–10.5)

## 2014-06-10 LAB — COMPREHENSIVE METABOLIC PANEL
ALT: 13 U/L (ref 0–53)
ANION GAP: 12 (ref 5–15)
AST: 12 U/L (ref 0–37)
Albumin: 3.5 g/dL (ref 3.5–5.2)
Alkaline Phosphatase: 49 U/L (ref 39–117)
BUN: 33 mg/dL — ABNORMAL HIGH (ref 6–23)
CALCIUM: 9.2 mg/dL (ref 8.4–10.5)
CO2: 23 mEq/L (ref 19–32)
CREATININE: 1.5 mg/dL — AB (ref 0.50–1.35)
Chloride: 99 mEq/L (ref 96–112)
GFR calc non Af Amer: 48 mL/min — ABNORMAL LOW (ref >90)
GFR, EST AFRICAN AMERICAN: 56 mL/min — AB (ref >90)
GLUCOSE: 132 mg/dL — AB (ref 70–99)
Potassium: 5 mEq/L (ref 3.7–5.3)
Sodium: 134 mEq/L — ABNORMAL LOW (ref 137–147)
Total Bilirubin: 0.2 mg/dL — ABNORMAL LOW (ref 0.3–1.2)
Total Protein: 6.9 g/dL (ref 6.0–8.3)

## 2014-06-10 LAB — HEMOGLOBIN A1C
Hgb A1c MFr Bld: 7 % — ABNORMAL HIGH (ref <5.7)
Mean Plasma Glucose: 154 mg/dL — ABNORMAL HIGH (ref <117)

## 2014-06-10 NOTE — Progress Notes (Signed)
I have reviewed the tests.  There are no major concerns.  Okay to proceed with surgery from my standpoint.

## 2014-06-10 NOTE — Progress Notes (Signed)
cmet results routed to dr gross inbasket by epic

## 2014-06-10 NOTE — Progress Notes (Signed)
lov bryan hager pa cardiology 05-19-14 epic

## 2014-06-10 NOTE — Progress Notes (Signed)
ekg 05-19-14 epic Stress test 05-28-14 epic Echo 04-16-14 epic eeg 04-21-14 epic Chest xray 2 view abnormal 04-18-14 epic, will repeat with pre op 06-10-14

## 2014-06-10 NOTE — Consult Note (Signed)
WOC ostomy consult note Patient seen per Dr. Johney [place]' request for abdominal assessment and preoperative stoma site selection in the event that one is needed next week during the scheduled repair of a fistula. Patient is also followed by Dr. Risa Grill from Urology. Patient assessed in the standing and sitting position and it is noted that the patient wears the elastic waistband of his underwear very low on his abdomen. Slightly rotund abdomen on a gentleman who is not obese.  Stoma site is selected for both an ileostomy and a colostomy as it is not known what type of diversion (if any) will be required. Colostomy site is located 6cm to the left of the umbilicus and 2cm below.  Ileostomy site is 6cm to the right of the umbilicus and 1cm below.  Markings are made with a surgical skin marking pen and are covered with a thin film, transparent dressing. Education provided: Patient is provided with a lengthy interaction during which ostomy terminology, basic A&P, stoma and pouching characteristics and return to activity following surgery are discussed.  Written teaching materials are provided, including a DVD that discusses ostomy surgery in the event that one is needed. He is present himself on Tuesday morning, October 27th at 5:15am.  He informs me that he has a supportive wife who will be accompanying him to the hospital on the day of surgery.  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 I look forward to assisting you in the care of this patient postoperatively in the event that a stoma is created intraoperatively.  If so, please reconsult at that time. Thanks, Maudie Flakes, MSN, RN, [place], [place], [place] [phone])

## 2014-06-10 NOTE — Patient Instructions (Addendum)
[place]  06/10/2014   Your procedure is scheduled on: Tuesday October 27th, 2015  Report to [facility] Emergency Room  Entrance and follow signs to  [place] at 515 AM.  Call this number if you have problems the morning of surgery [phone]   Remember:  Do not eat food or drink liquids :After Midnight.     Take these medicines the morning of surgery with A SIP OF WATER: klonopin if needed, diltiazem, labetaolol, bidil                               You may not have any metal on your body including hair pins and piercings  Do not wear jewelry, make-up, lotions, powders, or deodorant.   Men may shave face and neck.  Do not bring valuables to the hospital. [place].  Contacts, dentures or bridgework may not be worn into surgery.  Leave suitcase in the car. After surgery it may be brought to your room.  For patients admitted to the hospital, checkout time is 11:00 AM the day of discharge.   Patients discharged the day of surgery will not be allowed to drive home.  Name and phone number of your driver:  Special Instructions: N/A ________________________________________________________________________  [facility] - Preparing for Surgery Before surgery, you can play an important role.  Because skin is not sterile, your skin needs to be as free of germs as possible.  You can reduce the number of germs on your skin by washing with CHG (chlorahexidine gluconate) soap before surgery.  CHG is an antiseptic cleaner which kills germs and bonds with the skin to continue killing germs even after washing. Please DO NOT use if you have an allergy to CHG or antibacterial soaps.  If your skin becomes reddened/irritated stop using the CHG and inform your nurse when you arrive at Short Stay. Do not shave (including legs and underarms) for at least 48 hours prior to the first CHG shower.  You may shave your face/neck. Please follow these  instructions carefully:  1.  Shower with CHG Soap the night before surgery and the  morning of Surgery.  2.  If you choose to wash your hair, wash your hair first as usual with your  normal  shampoo.  3.  After you shampoo, rinse your hair and body thoroughly to remove the  shampoo.                           4.  Use CHG as you would any other liquid soap.  You can apply chg directly  to the skin and wash                       Gently with a scrungie or clean washcloth.  5.  Apply the CHG Soap to your body ONLY FROM THE NECK DOWN.   Do not use on face/ open                           Wound or open sores. Avoid contact with eyes, ears mouth and genitals (private parts).                       Wash face,  Genitals (private parts) with your normal soap.  6.  Wash thoroughly, paying special attention to the area where your surgery  will be performed.  7.  Thoroughly rinse your body with warm water from the neck down.  8.  DO NOT shower/wash with your normal soap after using and rinsing off  the CHG Soap.                9.  Pat yourself dry with a clean towel.            10.  Wear clean pajamas.            11.  Place clean sheets on your bed the night of your first shower and do not  sleep with pets. Day of Surgery : Do not apply any lotions/deodorants the morning of surgery.  Please wear clean clothes to the hospital/surgery center.  FAILURE TO FOLLOW THESE INSTRUCTIONS MAY RESULT IN THE CANCELLATION OF YOUR SURGERY PATIENT SIGNATURE_________________________________  NURSE SIGNATURE__________________________________  ________________________________________________________________________  WHAT IS A BLOOD TRANSFUSION? Blood Transfusion Information  A transfusion is the replacement of blood or some of its parts. Blood is made up of multiple cells which provide different functions.  Red blood cells carry oxygen and are used for blood loss replacement.  White blood cells fight against  infection.  Platelets control bleeding.  Plasma helps clot blood.  Other blood products are available for specialized needs, such as hemophilia or other clotting disorders. BEFORE THE TRANSFUSION  Who gives blood for transfusions?   Healthy volunteers who are fully evaluated to make sure their blood is safe. This is blood bank blood. Transfusion therapy is the safest it has ever been in the practice of medicine. Before blood is taken from a donor, a complete history is taken to make sure that person has no history of diseases nor engages in risky social behavior (examples are intravenous drug use or sexual activity with multiple partners). The donor's travel history is screened to minimize risk of transmitting infections, such as malaria. The donated blood is tested for signs of infectious diseases, such as HIV and hepatitis. The blood is then tested to be sure it is compatible with you in order to minimize the chance of a transfusion reaction. If you or a relative donates blood, this is often done in anticipation of surgery and is not appropriate for emergency situations. It takes many days to process the donated blood. RISKS AND COMPLICATIONS Although transfusion therapy is very safe and saves many lives, the main dangers of transfusion include:   Getting an infectious disease.  Developing a transfusion reaction. This is an allergic reaction to something in the blood you were given. Every precaution is taken to prevent this. The decision to have a blood transfusion has been considered carefully by your caregiver before blood is given. Blood is not given unless the benefits outweigh the risks. AFTER THE TRANSFUSION  Right after receiving a blood transfusion, you will usually feel much better and more energetic. This is especially true if your red blood cells have gotten low (anemic). The transfusion raises the level of the red blood cells which carry oxygen, and this usually causes an energy  increase.  The nurse administering the transfusion will monitor you carefully for complications. HOME CARE INSTRUCTIONS  No special instructions are needed after a transfusion. You may find your energy is better. Speak with your caregiver about any limitations on activity for underlying diseases you may have. SEEK MEDICAL CARE IF:   Your condition is not improving after your  transfusion.  You develop redness or irritation at the intravenous (IV) site. SEEK IMMEDIATE MEDICAL CARE IF:  Any of the following symptoms occur over the next 12 hours:  Shaking chills.  You have a temperature by mouth above 102 F (38.9 C), not controlled by medicine.  Chest, back, or muscle pain.  People around you feel you are not acting correctly or are confused.  Shortness of breath or difficulty breathing.  Dizziness and fainting.  You get a rash or develop hives.  You have a decrease in urine output.  Your urine turns a dark color or changes to pink, red, or brown. Any of the following symptoms occur over the next 10 days:  You have a temperature by mouth above 102 F (38.9 C), not controlled by medicine.  Shortness of breath.  Weakness after normal activity.  The white part of the eye turns yellow (jaundice).  You have a decrease in the amount of urine or are urinating less often.  Your urine turns a dark color or changes to pink, red, or brown. Document Released: 08/03/2000 Document Revised: 10/29/2011 Document Reviewed: 03/22/2008 [facility] Patient Information 2014 [place], [place].  _______________________________________________________________________

## 2014-06-13 MED ORDER — SODIUM CHLORIDE 0.9 % IV SOLN
Freq: Once | INTRAVENOUS | Status: DC
  Filled 2014-06-13: qty 6

## 2014-06-13 MED ORDER — BUPIVACAINE 0.25 % ON-Q PUMP DUAL CATH 300 ML
300.0000 mL | INJECTION | Status: DC
  Filled 2014-06-13: qty 300

## 2014-06-14 ENCOUNTER — Encounter (HOSPITAL_COMMUNITY): Payer: Self-pay | Admitting: *Deleted

## 2014-06-14 ENCOUNTER — Encounter (HOSPITAL_COMMUNITY)
Admission: RE | Disposition: A | Discharge status: Home / Self Care | Payer: Self-pay | Source: Ambulatory Visit | Attending: Internal Medicine

## 2014-06-14 ENCOUNTER — Ambulatory Visit (HOSPITAL_BASED_OUTPATIENT_CLINIC_OR_DEPARTMENT_OTHER)
Admission: RE | Admit: 2014-06-14 | Discharge: 2014-06-14 | Disposition: A | Discharge status: Home / Self Care | Payer: BC Managed Care – PPO | Source: Ambulatory Visit | Attending: Internal Medicine | Admitting: Internal Medicine

## 2014-06-14 DIAGNOSIS — Z8371 Family history of colonic polyps: Secondary | ICD-10-CM

## 2014-06-14 DIAGNOSIS — Z86010 Personal history of colonic polyps: Secondary | ICD-10-CM | POA: Insufficient documentation

## 2014-06-14 DIAGNOSIS — Z8673 Personal history of transient ischemic attack (TIA), and cerebral infarction without residual deficits: Secondary | ICD-10-CM

## 2014-06-14 DIAGNOSIS — Z8546 Personal history of malignant neoplasm of prostate: Secondary | ICD-10-CM

## 2014-06-14 DIAGNOSIS — E119 Type 2 diabetes mellitus without complications: Secondary | ICD-10-CM | POA: Insufficient documentation

## 2014-06-14 DIAGNOSIS — K5730 Diverticulosis of large intestine without perforation or abscess without bleeding: Secondary | ICD-10-CM

## 2014-06-14 DIAGNOSIS — K5720 Diverticulitis of large intestine with perforation and abscess without bleeding: Secondary | ICD-10-CM | POA: Diagnosis present

## 2014-06-14 DIAGNOSIS — D124 Benign neoplasm of descending colon: Secondary | ICD-10-CM

## 2014-06-14 DIAGNOSIS — Z87891 Personal history of nicotine dependence: Secondary | ICD-10-CM

## 2014-06-14 DIAGNOSIS — Z792 Long term (current) use of antibiotics: Secondary | ICD-10-CM

## 2014-06-14 DIAGNOSIS — Z7982 Long term (current) use of aspirin: Secondary | ICD-10-CM

## 2014-06-14 DIAGNOSIS — I10 Essential (primary) hypertension: Secondary | ICD-10-CM

## 2014-06-14 DIAGNOSIS — F1010 Alcohol abuse, uncomplicated: Secondary | ICD-10-CM | POA: Diagnosis present

## 2014-06-14 DIAGNOSIS — N321 Vesicointestinal fistula: Principal | ICD-10-CM | POA: Diagnosis present

## 2014-06-14 DIAGNOSIS — E118 Type 2 diabetes mellitus with unspecified complications: Secondary | ICD-10-CM | POA: Diagnosis present

## 2014-06-14 DIAGNOSIS — Z882 Allergy status to sulfonamides status: Secondary | ICD-10-CM | POA: Insufficient documentation

## 2014-06-14 DIAGNOSIS — Z1211 Encounter for screening for malignant neoplasm of colon: Secondary | ICD-10-CM | POA: Insufficient documentation

## 2014-06-14 DIAGNOSIS — I4891 Unspecified atrial fibrillation: Secondary | ICD-10-CM

## 2014-06-14 DIAGNOSIS — Z860101 Personal history of adenomatous and serrated colon polyps: Secondary | ICD-10-CM

## 2014-06-14 DIAGNOSIS — Z888 Allergy status to other drugs, medicaments and biological substances status: Secondary | ICD-10-CM

## 2014-06-14 DIAGNOSIS — K635 Polyp of colon: Secondary | ICD-10-CM

## 2014-06-14 HISTORY — PX: COLONOSCOPY: SHX5424

## 2014-06-14 HISTORY — DX: Personal history of colonic polyps: Z86.010

## 2014-06-14 HISTORY — DX: Personal history of adenomatous and serrated colon polyps: Z86.0101

## 2014-06-14 LAB — GLUCOSE, CAPILLARY: Glucose-Capillary: 147 mg/dL — ABNORMAL HIGH (ref 70–99)

## 2014-06-14 SURGERY — COLONOSCOPY
Anesthesia: Moderate Sedation

## 2014-06-14 MED ORDER — FENTANYL CITRATE 0.05 MG/ML IJ SOLN
INTRAMUSCULAR | Status: AC
  Filled 2014-06-14: qty 2

## 2014-06-14 MED ORDER — SODIUM CHLORIDE 0.9 % IV SOLN
Freq: Once | INTRAVENOUS | Status: DC
  Filled 2014-06-14: qty 6

## 2014-06-14 MED ORDER — FENTANYL CITRATE 0.05 MG/ML IJ SOLN
INTRAMUSCULAR | Status: DC | PRN
  Administered 2014-06-14 (×4): 25 ug via INTRAVENOUS

## 2014-06-14 MED ORDER — SODIUM CHLORIDE 0.9 % IV SOLN
INTRAVENOUS | Status: DC
  Administered 2014-06-14: 500 mL via INTRAVENOUS

## 2014-06-14 MED ORDER — MIDAZOLAM HCL 10 MG/2ML IJ SOLN
INTRAMUSCULAR | Status: AC
  Filled 2014-06-14: qty 2

## 2014-06-14 MED ORDER — DIPHENHYDRAMINE HCL 50 MG/ML IJ SOLN
INTRAMUSCULAR | Status: AC
  Filled 2014-06-14: qty 1

## 2014-06-14 MED ORDER — MIDAZOLAM HCL 5 MG/5ML IJ SOLN
INTRAMUSCULAR | Status: DC | PRN
  Administered 2014-06-14: 1 mg via INTRAVENOUS
  Administered 2014-06-14 (×2): 2 mg via INTRAVENOUS
  Administered 2014-06-14 (×2): 1 mg via INTRAVENOUS

## 2014-06-14 MED ORDER — DIPHENHYDRAMINE HCL 50 MG/ML IJ SOLN
INTRAMUSCULAR | Status: DC | PRN
  Administered 2014-06-14: 25 mg via INTRAVENOUS

## 2014-06-14 MED ORDER — RIVAROXABAN 20 MG PO TABS: 20.0000 mg | ORAL_TABLET | Freq: Every day | ORAL | Status: DC

## 2014-06-14 NOTE — H&P (Signed)
Douglas Ballard History and Physical   Primary Care Physician:  Stephens Shire, MD   Reason for Procedure:   Pre-op colonoscopy - prior to colo-vesical fistula repair  Plan:    Colonoscoopy     HPI: Douglas Ballard is a 63 y.o. male having surgery for a colo-vesical fistula tomorrow. He needs a pre-op colonoscopy.   Past Medical History  Diagnosis Date  . Hypertension   . Headache(784.0)   . Pneumonia   . 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   Marland Kitchen PNA (pneumonia) 06/26/2011  . Compression fracture of thoracic vertebra 06/26/2011  . Fistula, bladder   . Colon polyps   . Diabetes mellitus     diet controlled  . Cancer 2012    Prostate surgery  . 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 Atrial fibrillation     one episode sept 2015    Past Surgical History  Procedure Laterality Date  . Robot assisted laparoscopic radical prostatectomy  01/31/2011    Robotic-assisted laparoscopic radical retropubic  . Lipoma excision  2012    Dr Nonah Mattes  . Prostatectomy  01/2011    Prior to Admission medications   Medication Sig Start Date End Date Taking? Authorizing Provider  aspirin EC 81 MG tablet Take 81 mg by mouth every morning.   Yes Historical Provider, MD  clonazePAM (KLONOPIN) 0.5 MG tablet Take 0.5 mg by mouth 2 (two) times daily as needed for anxiety.   Yes Historical Provider, MD  diltiazem (CARDIZEM CD) 240 MG 24 hr capsule Take 240 mg by mouth every morning.   Yes Historical Provider, MD  feeding supplement, GLUCERNA SHAKE, (GLUCERNA SHAKE) LIQD Take 237 mLs by mouth 2 (two) times daily between meals.   Yes Historical Provider, MD  glipiZIDE (GLUCOTROL) 5 MG tablet Take 2.5 mg by mouth daily before breakfast.   Yes Historical Provider, MD  isosorbide-hydrALAZINE (BIDIL) 20-37.5 MG per tablet Take 1 tablet by  mouth 2 (two) times daily.   Yes Historical Provider, MD  labetalol (NORMODYNE) 300 MG tablet Take 300 mg by mouth 3 (three) times daily.   Yes Historical Provider, MD  levofloxacin (LEVAQUIN) 500 MG tablet Take 500 mg by mouth every morning.   Yes Historical Provider, MD  Multiple Vitamins-Minerals (MULTIVITAMINS THER. W/MINERALS) TABS Take 1 tablet by mouth every morning.    Yes Historical Provider, MD  Naphazoline-Pheniramine (NAPHCON-A OP) Apply 1-2 drops to eye daily as needed (drye eyes).   Yes Historical Provider, MD  nicotine (NICODERM CQ - DOSED IN MG/24 HOURS) 21 mg/24hr patch Place 21 mg onto the skin daily.   Yes Historical Provider, MD  olmesartan (BENICAR) 40 MG tablet Take 40 mg by mouth every morning.   Yes Historical Provider, MD  tamsulosin (FLOMAX) 0.4 MG CAPS capsule Take 0.4 mg by mouth daily with supper.   Yes Historical Provider, MD  zolpidem (AMBIEN) 5 MG tablet Take 5 mg by mouth at bedtime as needed for sleep.  12/31/13  Yes Historical Provider, MD  rivaroxaban (XARELTO) 20 MG TABS tablet Take 20 mg by mouth daily with supper.    Historical Provider, MD  [place] as needed. Naphcon a otc eye drops    Historical Provider, MD    Current Facility-Administered Medications  Medication Dose Route Frequency Provider Last Rate Last Dose  .  0.9 %  sodium chloride infusion   Intravenous Continuous Amy S Esterwood, PA-C 20 mL/hr at 06/14/14 1007 500 mL at 06/14/14 1007    Allergies as of 05/12/2014 - Review Complete 05/11/2014  Allergen Reaction Noted  . Hydrochlorothiazide  05/11/2014  . Lasix [furosemide]  05/11/2014  . Nsaids  04/16/2014  . Sulfa antibiotics Other (See Comments) 06/26/2011    Family History  Problem Relation Age of Onset  . Atrial fibrillation Father     onset 67s. Had pacemaker placed for sinus pause  . Prostate cancer Father   . Breast cancer Mother   . Lung cancer Mother   . Colon cancer Neg Hx   . Leukemia Brother   . Colon polyps Brother    . Diabetes Neg Hx     History   Social History  . Marital Status: Married    Spouse Name: N/A    Number of Children: 2  . 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 Smoking status: Former Smoker -- 2.00 packs/day for 25 years    Types: Cigarettes    Quit date: 04/14/2014  . Smokeless tobacco: Never Used  . Alcohol Use: No     Comment: none since 04-14-2014  . Drug Use: No  . Sexual Activity: Not Currently      Review of Systems: All other review of systems negative except as mentioned in the HPI.  Physical Exam: Vital signs in last 24 hours: Temp:  [98.2 F (36.8 C)] 98.2 F (36.8 C) (10/26 0932) Pulse Rate:  [70] 70 (10/26 0932) Resp:  [14] 14 (10/26 0932) BP: (139)/(74) 139/74 mmHg (10/26 0932) SpO2:  [98 %] 98 % (10/26 0932) Weight:  [167 lb (75.751 kg)] 167 lb (75.751 kg) (10/26 0932)   General:   Alert,  Well-developed, well-nourished, pleasant and cooperative in NAD Lungs:  Clear throughout to auscultation.   Heart:  Regular rate and rhythm; no murmurs, clicks, rubs,  or gallops. Abdomen:  Soft, nontender and nondistended. Normal bowel sounds.   Neuro/Psych:  Alert and cooperative. Normal mood and affect. A and O x 3   @Carl  Simonne Maffucci, MD, [facility] [phone] (pager) 06/14/2014 10:51 AM@

## 2014-06-14 NOTE — Op Note (Signed)
[facility] [place], [address]   COLONOSCOPY PROCEDURE REPORT  PATIENT: Douglas Ballard, Douglas Ballard  MR#: [id number] BIRTHDATE: July 12, 1951 , 56  yrs. old GENDER: male ENDOSCOPIST: Gatha Mayer, MD, [facility] PROCEDURE DATE:  06/14/2014 PROCEDURE:   Colonoscopy with snare polypectomy First Screening Colonoscopy - Avg.  risk and is 50 yrs.  old or older - No.  Prior Negative Screening - Now for repeat screening. N/A  History of Adenoma - Now for follow-up colonoscopy & has been > or = to 3 yrs.  Yes hx of adenoma.  Has been 3 or more years since last colonoscopy.  Polyps Removed Today? Yes. ASA CLASS:   Class III INDICATIONS:surveillance colonoscopy based on a history of adenomatous colonic polyp(s) and pre-op before colo-vesical fistula surgery. MEDICATIONS: Benadryl 25 mg IV, Fentanyl 100 mcg IV, and Versed 7 mg IV  DESCRIPTION OF PROCEDURE:   After the risks benefits and alternatives of the procedure were thoroughly explained, informed consent was obtained.  The digital rectal exam revealed no rectal mass.   The Pentax Adult Colonscope [id number]  endoscope was introduced through the anus and advanced to the cecum, which was identified by both the appendix and ileocecal valve. No adverse events experienced.   The quality of the prep was adequate.  The instrument was then slowly withdrawn as the colon was fully examined.   COLON FINDINGS: There was diverticulosis noted in the sigmoid colon and right colon.   A sessile polyp measuring 8 mm in size was found in the descending colon.  A polypectomy was performed with a cold snare.  The resection was complete, the polyp tissue was completely retrieved and sent to histology.   The examination was otherwise normal.  Retroflexed views revealed no abnormalities. The time to cecum=3 minutes 0 seconds.  Withdrawal time=11 minutes 0 seconds. The scope was withdrawn and the procedure completed. COMPLICATIONS: There were no  immediate complications.  ENDOSCOPIC IMPRESSION: 1.   Diverticulosis was noted in the sigmoid colon and right colon 2.   Sessile polyp measuring 8 mm in size was found in the descending colon; polypectomy was performed with a cold snare 3.   The examination was otherwise normal  RECOMMENDATIONS: 1.  Timing of repeat colonoscopy will be determined by pathology findings. 2.   Stay off Xarelto Colon surgery tomorrow  eSigned:  Gatha Mayer, MD, [facility] 06/14/2014 11:48 AM   cc: Michael Boston, MD  and The Patient

## 2014-06-14 NOTE — Discharge Instructions (Addendum)
I found and removed one polyp that looks benign.  I will let you know pathology results and when to have another routine colonoscopy by mail.   You also have diverticulosis - as you are aware.  Good luck with your surgery. DO NOT TAKE XARELTO UNTIL TOD TO DO SO  I appreciate the opportunity to care for you. Gatha Mayer, MD, FACG   YOU HAD AN ENDOSCOPIC PROCEDURE TODAY: Refer to the procedure report and other information in the discharge instructions given to you for any specific questions about what was found during the examination. If this information does not answer your questions, please call Dr. Celesta Aver office at [phone] to clarify.   YOU SHOULD EXPECT: Some feelings of bloating in the abdomen. Passage of more gas than usual. Walking can help get rid of the air that was put into your GI tract during the procedure and reduce the bloating. If you had a lower endoscopy (such as a colonoscopy or flexible sigmoidoscopy) you may notice spotting of blood in your stool or on the toilet paper. Some abdominal soreness may be present for a day or two, also.  DIET: Your first meal following the procedure should be a light meal and then it is ok to progress to your normal diet. A half-sandwich or bowl of soup is an example of a good first meal. Heavy or fried foods are harder to digest and may make you feel nauseous or bloated. Drink plenty of fluids but you should avoid alcoholic beverages for 24 hours.   ACTIVITY: Your care partner should take you home directly after the procedure. You should plan to take it easy, moving slowly for the rest of the day. You can resume normal activity the day after the procedure however YOU SHOULD NOT DRIVE, use power tools, machinery or perform tasks that involve climbing or major physical exertion for 24 hours (because of the sedation medicines used during the test).   SYMPTOMS TO REPORT IMMEDIATELY: A gastroenterologist can be reached at any hour. Please  call [phone]  for any of the following symptoms:  Following lower endoscopy (colonoscopy, flexible sigmoidoscopy) Excessive amounts of blood in the stool  Significant tenderness, worsening of abdominal pains  Swelling of the abdomen that is new, acute  Fever of 100 or higher   FOLLOW UP:  If any biopsies were taken you will be contacted by phone or by letter within the next 1-3 weeks. Call [phone]  if you have not heard about the biopsies in 3 weeks.  Please also call with any specific questions about appointments or follow up tests.

## 2014-06-14 NOTE — Anesthesia Preprocedure Evaluation (Addendum)
Anesthesia Evaluation  Patient identified by MRN, date of birth, ID band Patient awake    Reviewed: Allergy & Precautions, H&P , NPO status , Patient's Chart, lab work & pertinent test results, reviewed documented beta blocker date and time   Airway Mallampati: II  TM Distance: >3 FB Neck ROM: full    Dental  (+) Poor Dentition, Chipped, Dental Advisory Given Broken right upper front:   Pulmonary neg pulmonary ROS, former smoker,  breath sounds clear to auscultation  Pulmonary exam normal       Cardiovascular Exercise Tolerance: Good hypertension, Pt. on medications and Pt. on home beta blockers + dysrhythmias Atrial Fibrillation Rhythm:regular Rate:Normal  One episode AF 1/15   Neuro/Psych Seizures -,  .  TIA x 3. Recent alcohol withdrawal seizure x 1 TIAnegative psych ROS   GI/Hepatic negative GI ROS, Neg liver ROS, (+)     substance abuse  alcohol use,   Endo/Other  diabetes, Well Controlled, Type 2, Oral Hypoglycemic Agents  Renal/GU negative Renal ROS  negative genitourinary   Musculoskeletal   Abdominal   Peds  Hematology negative hematology ROS (+)   Anesthesia Other Findings   Reproductive/Obstetrics negative OB ROS                           Anesthesia Physical Anesthesia Plan  ASA: III  Anesthesia Plan: General   Post-op Pain Management:    Induction: Intravenous  Airway Management Planned: Oral ETT  Additional Equipment:   Intra-op Plan:   Post-operative Plan: Extubation in OR  Informed Consent: I have reviewed the patients History and Physical, chart, labs and discussed the procedure including the risks, benefits and alternatives for the proposed anesthesia with the patient or authorized representative who has indicated his/her understanding and acceptance.   Dental Advisory Given  Plan Discussed with: CRNA and Surgeon  Anesthesia Plan Comments:          Anesthesia Quick Evaluation

## 2014-06-15 ENCOUNTER — Inpatient Hospital Stay (HOSPITAL_COMMUNITY): Payer: BC Managed Care – PPO

## 2014-06-15 ENCOUNTER — Encounter (HOSPITAL_COMMUNITY): Payer: BC Managed Care – PPO | Admitting: Anesthesiology

## 2014-06-15 ENCOUNTER — Encounter (HOSPITAL_COMMUNITY): Payer: Self-pay | Admitting: *Deleted

## 2014-06-15 ENCOUNTER — Encounter (HOSPITAL_COMMUNITY)
Admission: RE | Disposition: A | Discharge status: Home / Self Care | Payer: Self-pay | Source: Ambulatory Visit | Attending: Surgery

## 2014-06-15 ENCOUNTER — Inpatient Hospital Stay (HOSPITAL_COMMUNITY)
Admission: RE | Admit: 2014-06-15 | Discharge: 2014-06-20 | DRG: 674 | Disposition: A | Discharge status: Home / Self Care | Payer: BC Managed Care – PPO | Source: Ambulatory Visit | Attending: Surgery | Admitting: Surgery

## 2014-06-15 ENCOUNTER — Inpatient Hospital Stay (HOSPITAL_COMMUNITY): Payer: BC Managed Care – PPO | Admitting: Anesthesiology

## 2014-06-15 DIAGNOSIS — I10 Essential (primary) hypertension: Secondary | ICD-10-CM

## 2014-06-15 DIAGNOSIS — I4891 Unspecified atrial fibrillation: Secondary | ICD-10-CM | POA: Diagnosis present

## 2014-06-15 DIAGNOSIS — K5720 Diverticulitis of large intestine with perforation and abscess without bleeding: Secondary | ICD-10-CM | POA: Diagnosis present

## 2014-06-15 DIAGNOSIS — Z720 Tobacco use: Secondary | ICD-10-CM | POA: Diagnosis present

## 2014-06-15 DIAGNOSIS — Z87891 Personal history of nicotine dependence: Secondary | ICD-10-CM | POA: Diagnosis not present

## 2014-06-15 DIAGNOSIS — E1122 Type 2 diabetes mellitus with diabetic chronic kidney disease: Secondary | ICD-10-CM

## 2014-06-15 DIAGNOSIS — N321 Vesicointestinal fistula: Secondary | ICD-10-CM

## 2014-06-15 DIAGNOSIS — F1020 Alcohol dependence, uncomplicated: Secondary | ICD-10-CM | POA: Diagnosis present

## 2014-06-15 DIAGNOSIS — E118 Type 2 diabetes mellitus with unspecified complications: Secondary | ICD-10-CM | POA: Diagnosis present

## 2014-06-15 DIAGNOSIS — Z8673 Personal history of transient ischemic attack (TIA), and cerebral infarction without residual deficits: Secondary | ICD-10-CM | POA: Diagnosis not present

## 2014-06-15 DIAGNOSIS — Z792 Long term (current) use of antibiotics: Secondary | ICD-10-CM | POA: Diagnosis not present

## 2014-06-15 DIAGNOSIS — I152 Hypertension secondary to endocrine disorders: Secondary | ICD-10-CM | POA: Diagnosis present

## 2014-06-15 DIAGNOSIS — F1010 Alcohol abuse, uncomplicated: Secondary | ICD-10-CM | POA: Diagnosis present

## 2014-06-15 DIAGNOSIS — Z8546 Personal history of malignant neoplasm of prostate: Secondary | ICD-10-CM | POA: Diagnosis not present

## 2014-06-15 DIAGNOSIS — K5730 Diverticulosis of large intestine without perforation or abscess without bleeding: Secondary | ICD-10-CM

## 2014-06-15 DIAGNOSIS — Z7982 Long term (current) use of aspirin: Secondary | ICD-10-CM | POA: Diagnosis not present

## 2014-06-15 DIAGNOSIS — E1159 Type 2 diabetes mellitus with other circulatory complications: Secondary | ICD-10-CM | POA: Diagnosis present

## 2014-06-15 HISTORY — PX: PROCTOSCOPY: SHX2266

## 2014-06-15 HISTORY — DX: Urinary tract infection, site not specified: N39.0

## 2014-06-15 HISTORY — PX: CYSTOSCOPY WITH STENT PLACEMENT: SHX5790

## 2014-06-15 HISTORY — DX: Type 2 diabetes mellitus without complications: E11.9

## 2014-06-15 HISTORY — DX: Sepsis, unspecified organism: A41.9

## 2014-06-15 LAB — GLUCOSE, CAPILLARY
GLUCOSE-CAPILLARY: 150 mg/dL — AB (ref 70–99)
GLUCOSE-CAPILLARY: 194 mg/dL — AB (ref 70–99)
Glucose-Capillary: 130 mg/dL — ABNORMAL HIGH (ref 70–99)

## 2014-06-15 LAB — TYPE AND SCREEN
ABO/RH(D): O POS
Antibody Screen: NEGATIVE

## 2014-06-15 SURGERY — COLECTOMY, PARTIAL, ROBOT-ASSISTED, LAPAROSCOPIC
Anesthesia: General

## 2014-06-15 MED ORDER — ACETAMINOPHEN 500 MG PO TABS
1000.0000 mg | ORAL_TABLET | Freq: Three times a day (TID) | ORAL | Status: DC
  Administered 2014-06-15 – 2014-06-20 (×14): 1000 mg via ORAL
  Filled 2014-06-15 (×17): qty 2

## 2014-06-15 MED ORDER — DIPHENHYDRAMINE HCL 50 MG/ML IJ SOLN: 12.5000 mg | Freq: Four times a day (QID) | INTRAMUSCULAR | Status: DC | PRN

## 2014-06-15 MED ORDER — HYDROMORPHONE HCL 1 MG/ML IJ SOLN
0.2500 mg | INTRAMUSCULAR | Status: DC | PRN
Start: 2014-06-15 — End: 2014-06-15
  Administered 2014-06-15: 0.25 mg via INTRAVENOUS
  Administered 2014-06-15: 0.5 mg via INTRAVENOUS
  Administered 2014-06-15: 0.25 mg via INTRAVENOUS

## 2014-06-15 MED ORDER — CEFOTETAN DISODIUM-DEXTROSE 2-2.08 GM-% IV SOLR
INTRAVENOUS | Status: AC
  Filled 2014-06-15: qty 50

## 2014-06-15 MED ORDER — OXYCODONE HCL 5 MG PO TABS: 5.0000 mg | ORAL_TABLET | ORAL | Status: DC | PRN

## 2014-06-15 MED ORDER — MIDAZOLAM HCL 2 MG/2ML IJ SOLN
INTRAMUSCULAR | Status: AC
  Filled 2014-06-15: qty 2

## 2014-06-15 MED ORDER — MAGIC MOUTHWASH: 15.0000 mL | Freq: Four times a day (QID) | ORAL | Status: DC | PRN

## 2014-06-15 MED ORDER — LACTATED RINGERS IV BOLUS (SEPSIS): 1000.0000 mL | Freq: Three times a day (TID) | INTRAVENOUS | Status: DC | PRN

## 2014-06-15 MED ORDER — NEOSTIGMINE METHYLSULFATE 10 MG/10ML IV SOLN
INTRAVENOUS | Status: AC
  Filled 2014-06-15: qty 1

## 2014-06-15 MED ORDER — GLYCOPYRROLATE 0.2 MG/ML IJ SOLN
INTRAMUSCULAR | Status: AC
  Filled 2014-06-15: qty 4

## 2014-06-15 MED ORDER — NEOSTIGMINE METHYLSULFATE 10 MG/10ML IV SOLN
INTRAVENOUS | Status: DC | PRN
  Administered 2014-06-15: 4 mg via INTRAVENOUS

## 2014-06-15 MED ORDER — HYDROMORPHONE HCL 1 MG/ML IJ SOLN
INTRAMUSCULAR | Status: AC
  Filled 2014-06-15: qty 1

## 2014-06-15 MED ORDER — CISATRACURIUM BESYLATE 20 MG/10ML IV SOLN
INTRAVENOUS | Status: AC
  Filled 2014-06-15: qty 10

## 2014-06-15 MED ORDER — LABETALOL HCL 300 MG PO TABS
300.0000 mg | ORAL_TABLET | Freq: Three times a day (TID) | ORAL | Status: DC
  Administered 2014-06-15 – 2014-06-20 (×14): 300 mg via ORAL
  Filled 2014-06-15 (×15): qty 1

## 2014-06-15 MED ORDER — ONDANSETRON HCL 4 MG/2ML IJ SOLN
4.0000 mg | Freq: Four times a day (QID) | INTRAMUSCULAR | Status: DC | PRN
  Administered 2014-06-17: 4 mg via INTRAVENOUS
  Filled 2014-06-15: qty 2

## 2014-06-15 MED ORDER — SUFENTANIL CITRATE 50 MCG/ML IV SOLN
INTRAVENOUS | Status: DC | PRN
  Administered 2014-06-15 (×2): 10 ug via INTRAVENOUS
  Administered 2014-06-15 (×2): 5 ug via INTRAVENOUS
  Administered 2014-06-15: 10 ug via INTRAVENOUS
  Administered 2014-06-15: 5 ug via INTRAVENOUS

## 2014-06-15 MED ORDER — RIVAROXABAN 20 MG PO TABS: 20.0000 mg | ORAL_TABLET | Freq: Every day | ORAL | Status: DC

## 2014-06-15 MED ORDER — ASPIRIN EC 81 MG PO TBEC
81.0000 mg | DELAYED_RELEASE_TABLET | Freq: Every day | ORAL | Status: DC
  Administered 2014-06-16 – 2014-06-17 (×2): 81 mg via ORAL
  Filled 2014-06-15 (×3): qty 1

## 2014-06-15 MED ORDER — DIPHENHYDRAMINE HCL 12.5 MG/5ML PO ELIX: 12.5000 mg | ORAL_SOLUTION | Freq: Four times a day (QID) | ORAL | Status: DC | PRN

## 2014-06-15 MED ORDER — ZOLPIDEM TARTRATE 5 MG PO TABS
5.0000 mg | ORAL_TABLET | Freq: Every evening | ORAL | Status: DC | PRN
  Administered 2014-06-17 – 2014-06-19 (×3): 5 mg via ORAL
  Filled 2014-06-15 (×3): qty 1

## 2014-06-15 MED ORDER — SACCHAROMYCES BOULARDII 250 MG PO CAPS
250.0000 mg | ORAL_CAPSULE | Freq: Two times a day (BID) | ORAL | Status: DC
  Administered 2014-06-15 – 2014-06-20 (×10): 250 mg via ORAL
  Filled 2014-06-15 (×10): qty 1

## 2014-06-15 MED ORDER — METHYLENE BLUE 1 % INJ SOLN
INTRAMUSCULAR | Status: AC
  Filled 2014-06-15: qty 10

## 2014-06-15 MED ORDER — INSULIN ASPART 100 UNIT/ML ~~LOC~~ SOLN
0.0000 [IU] | Freq: Three times a day (TID) | SUBCUTANEOUS | Status: DC
  Administered 2014-06-15 – 2014-06-16 (×3): 2 [IU] via SUBCUTANEOUS
  Administered 2014-06-17 – 2014-06-18 (×2): 3 [IU] via SUBCUTANEOUS
  Administered 2014-06-18 – 2014-06-19 (×4): 2 [IU] via SUBCUTANEOUS
  Administered 2014-06-20: 5 [IU] via SUBCUTANEOUS

## 2014-06-15 MED ORDER — IOHEXOL 300 MG/ML  SOLN
INTRAMUSCULAR | Status: DC | PRN
  Administered 2014-06-15: 8 mL via URETHRAL

## 2014-06-15 MED ORDER — ONDANSETRON HCL 4 MG/2ML IJ SOLN
INTRAMUSCULAR | Status: DC | PRN
  Administered 2014-06-15: 4 mg via INTRAVENOUS

## 2014-06-15 MED ORDER — LIDOCAINE HCL (CARDIAC) 20 MG/ML IV SOLN
INTRAVENOUS | Status: AC
  Filled 2014-06-15: qty 5

## 2014-06-15 MED ORDER — HYDROMORPHONE HCL 1 MG/ML IJ SOLN
0.5000 mg | INTRAMUSCULAR | Status: DC | PRN
  Administered 2014-06-15: 1 mg via INTRAVENOUS
  Administered 2014-06-15: 0.5 mg via INTRAVENOUS
  Administered 2014-06-15 – 2014-06-16 (×5): 1 mg via INTRAVENOUS
  Administered 2014-06-16: 2 mg via INTRAVENOUS
  Administered 2014-06-16 – 2014-06-17 (×4): 1 mg via INTRAVENOUS
  Administered 2014-06-18 – 2014-06-19 (×4): 2 mg via INTRAVENOUS
  Administered 2014-06-19: 1 mg via INTRAVENOUS
  Administered 2014-06-19: 2 mg via INTRAVENOUS
  Administered 2014-06-19: 1 mg via INTRAVENOUS
  Administered 2014-06-20: 2 mg via INTRAVENOUS
  Administered 2014-06-20: 0.5 mg via INTRAVENOUS
  Filled 2014-06-15: qty 1
  Filled 2014-06-15: qty 2
  Filled 2014-06-15 (×2): qty 1
  Filled 2014-06-15: qty 2
  Filled 2014-06-15 (×3): qty 1
  Filled 2014-06-15 (×2): qty 2
  Filled 2014-06-15 (×2): qty 1
  Filled 2014-06-15: qty 2
  Filled 2014-06-15 (×5): qty 1
  Filled 2014-06-15: qty 2
  Filled 2014-06-15: qty 1
  Filled 2014-06-15: qty 2

## 2014-06-15 MED ORDER — SODIUM CHLORIDE 0.9 % IV SOLN
INTRAVENOUS | Status: DC | PRN
  Administered 2014-06-15 (×2): via INTRAVENOUS

## 2014-06-15 MED ORDER — EPHEDRINE SULFATE 50 MG/ML IJ SOLN
INTRAMUSCULAR | Status: DC | PRN
  Administered 2014-06-15: 5 mg via INTRAVENOUS

## 2014-06-15 MED ORDER — ALVIMOPAN 12 MG PO CAPS
12.0000 mg | ORAL_CAPSULE | Freq: Two times a day (BID) | ORAL | Status: DC
  Administered 2014-06-16 – 2014-06-17 (×3): 12 mg via ORAL
  Filled 2014-06-15 (×3): qty 1

## 2014-06-15 MED ORDER — ROCURONIUM BROMIDE 100 MG/10ML IV SOLN
INTRAVENOUS | Status: AC
  Filled 2014-06-15: qty 1

## 2014-06-15 MED ORDER — MENTHOL 3 MG MT LOZG: 1.0000 | LOZENGE | OROMUCOSAL | Status: DC | PRN

## 2014-06-15 MED ORDER — STERILE WATER FOR IRRIGATION IR SOLN
Status: DC | PRN
  Administered 2014-06-15: 3000 mL

## 2014-06-15 MED ORDER — PROMETHAZINE HCL 25 MG/ML IJ SOLN
6.2500 mg | INTRAMUSCULAR | Status: DC | PRN
  Administered 2014-06-16 – 2014-06-17 (×3): 12.5 mg via INTRAVENOUS
  Filled 2014-06-15 (×3): qty 1

## 2014-06-15 MED ORDER — NICOTINE 21 MG/24HR TD PT24
21.0000 mg | MEDICATED_PATCH | Freq: Every day | TRANSDERMAL | Status: DC
  Administered 2014-06-15 – 2014-06-20 (×5): 21 mg via TRANSDERMAL
  Filled 2014-06-15 (×6): qty 1

## 2014-06-15 MED ORDER — ALUM & MAG HYDROXIDE-SIMETH 200-200-20 MG/5ML PO SUSP
30.0000 mL | Freq: Four times a day (QID) | ORAL | Status: DC | PRN
  Administered 2014-06-16: 30 mL via ORAL
  Filled 2014-06-15: qty 30

## 2014-06-15 MED ORDER — MIDAZOLAM HCL 5 MG/5ML IJ SOLN
INTRAMUSCULAR | Status: DC | PRN
  Administered 2014-06-15: 2 mg via INTRAVENOUS

## 2014-06-15 MED ORDER — SUFENTANIL CITRATE 50 MCG/ML IV SOLN
INTRAVENOUS | Status: AC
  Filled 2014-06-15: qty 1

## 2014-06-15 MED ORDER — SODIUM CHLORIDE 0.9 % IV SOLN
INTRAVENOUS | Status: DC
  Administered 2014-06-15 – 2014-06-16 (×3): via INTRAVENOUS

## 2014-06-15 MED ORDER — INSULIN ASPART 100 UNIT/ML ~~LOC~~ SOLN: 0.0000 [IU] | Freq: Every day | SUBCUTANEOUS | Status: DC

## 2014-06-15 MED ORDER — 0.9 % SODIUM CHLORIDE (POUR BTL) OPTIME
TOPICAL | Status: DC | PRN
  Administered 2014-06-15: 2000 mL

## 2014-06-15 MED ORDER — ROCURONIUM BROMIDE 100 MG/10ML IV SOLN
INTRAVENOUS | Status: DC | PRN
Start: 2014-06-15 — End: 2014-06-15
  Administered 2014-06-15: 45 mg via INTRAVENOUS

## 2014-06-15 MED ORDER — PROPOFOL 10 MG/ML IV BOLUS
INTRAVENOUS | Status: DC | PRN
Start: 2014-06-15 — End: 2014-06-15
  Administered 2014-06-15: 170 mg via INTRAVENOUS

## 2014-06-15 MED ORDER — LIP MEDEX EX OINT
1.0000 "application " | TOPICAL_OINTMENT | Freq: Two times a day (BID) | CUTANEOUS | Status: DC
  Administered 2014-06-15 – 2014-06-20 (×8): 1 via TOPICAL
  Filled 2014-06-15: qty 7

## 2014-06-15 MED ORDER — CLONAZEPAM 0.5 MG PO TABS
0.5000 mg | ORAL_TABLET | Freq: Two times a day (BID) | ORAL | Status: DC | PRN
  Administered 2014-06-15 – 2014-06-16 (×2): 0.5 mg via ORAL
  Filled 2014-06-15 (×2): qty 1

## 2014-06-15 MED ORDER — LORAZEPAM 2 MG/ML IJ SOLN: 0.5000 mg | Freq: Three times a day (TID) | INTRAMUSCULAR | Status: DC | PRN

## 2014-06-15 MED ORDER — DILTIAZEM HCL ER COATED BEADS 120 MG PO CP24
240.0000 mg | ORAL_CAPSULE | Freq: Every day | ORAL | Status: DC
  Administered 2014-06-16 – 2014-06-20 (×5): 240 mg via ORAL
  Filled 2014-06-15 (×5): qty 2

## 2014-06-15 MED ORDER — TAMSULOSIN HCL 0.4 MG PO CAPS
0.4000 mg | ORAL_CAPSULE | Freq: Every day | ORAL | Status: DC
  Administered 2014-06-15 – 2014-06-19 (×4): 0.4 mg via ORAL
  Filled 2014-06-15 (×6): qty 1

## 2014-06-15 MED ORDER — SODIUM CHLORIDE 0.9 % IV SOLN
INTRAVENOUS | Status: DC | PRN
  Administered 2014-06-15: 11:00:00 via INTRAPERITONEAL

## 2014-06-15 MED ORDER — SODIUM CHLORIDE 0.9 % IJ SOLN
INTRAMUSCULAR | Status: AC
  Filled 2014-06-15: qty 50

## 2014-06-15 MED ORDER — ADULT MULTIVITAMIN W/MINERALS CH
1.0000 | ORAL_TABLET | Freq: Every day | ORAL | Status: DC
  Administered 2014-06-16 – 2014-06-20 (×5): 1 via ORAL
  Filled 2014-06-15 (×5): qty 1

## 2014-06-15 MED ORDER — ONDANSETRON HCL 4 MG PO TABS
4.0000 mg | ORAL_TABLET | Freq: Four times a day (QID) | ORAL | Status: DC | PRN
Start: 2014-06-15 — End: 2014-06-20

## 2014-06-15 MED ORDER — LACTATED RINGERS IV SOLN: INTRAVENOUS | Status: DC

## 2014-06-15 MED ORDER — LACTATED RINGERS IR SOLN
Status: DC | PRN
  Administered 2014-06-15: 1

## 2014-06-15 MED ORDER — BUPIVACAINE-EPINEPHRINE (PF) 0.25% -1:200000 IJ SOLN
INTRAMUSCULAR | Status: AC
  Filled 2014-06-15: qty 30

## 2014-06-15 MED ORDER — ONDANSETRON HCL 4 MG/2ML IJ SOLN
INTRAMUSCULAR | Status: AC
  Filled 2014-06-15: qty 2

## 2014-06-15 MED ORDER — DEXTROSE 5 % IV SOLN
2.0000 g | Freq: Two times a day (BID) | INTRAVENOUS | Status: AC
  Administered 2014-06-15: 2 g via INTRAVENOUS
  Filled 2014-06-15: qty 2

## 2014-06-15 MED ORDER — EPHEDRINE SULFATE 50 MG/ML IJ SOLN
INTRAMUSCULAR | Status: AC
  Filled 2014-06-15: qty 1

## 2014-06-15 MED ORDER — PROPOFOL 10 MG/ML IV BOLUS
INTRAVENOUS | Status: AC
  Filled 2014-06-15: qty 20

## 2014-06-15 MED ORDER — BUPIVACAINE-EPINEPHRINE 0.25% -1:200000 IJ SOLN
INTRAMUSCULAR | Status: AC
  Filled 2014-06-15: qty 1

## 2014-06-15 MED ORDER — ALVIMOPAN 12 MG PO CAPS
12.0000 mg | ORAL_CAPSULE | Freq: Once | ORAL | Status: AC
  Administered 2014-06-15: 12 mg via ORAL
  Filled 2014-06-15: qty 1

## 2014-06-15 MED ORDER — HYDROMORPHONE HCL 2 MG/ML IJ SOLN
INTRAMUSCULAR | Status: AC
  Filled 2014-06-15: qty 1

## 2014-06-15 MED ORDER — NAPHAZOLINE-PHENIRAMINE 0.025-0.3 % OP SOLN: 1.0000 [drp] | Freq: Four times a day (QID) | OPHTHALMIC | Status: DC | PRN

## 2014-06-15 MED ORDER — PHENOL 1.4 % MT LIQD: 2.0000 | OROMUCOSAL | Status: DC | PRN

## 2014-06-15 MED ORDER — BUPIVACAINE 0.25 % ON-Q PUMP DUAL CATH 300 ML
300.0000 mL | INJECTION | Status: DC
  Filled 2014-06-15: qty 300

## 2014-06-15 MED ORDER — RIVAROXABAN 20 MG PO TABS
20.0000 mg | ORAL_TABLET | Freq: Every day | ORAL | Status: DC
  Filled 2014-06-15: qty 1

## 2014-06-15 MED ORDER — METOCLOPRAMIDE HCL 5 MG/ML IJ SOLN
INTRAMUSCULAR | Status: DC | PRN
  Administered 2014-06-15: 10 mg via INTRAVENOUS

## 2014-06-15 MED ORDER — CHLORHEXIDINE GLUCONATE 4 % EX LIQD: 60.0000 mL | Freq: Once | CUTANEOUS | Status: DC

## 2014-06-15 MED ORDER — ISOSORB DINITRATE-HYDRALAZINE 20-37.5 MG PO TABS: 1.0000 | ORAL_TABLET | Freq: Two times a day (BID) | ORAL | Status: DC

## 2014-06-15 MED ORDER — HEPARIN SODIUM (PORCINE) 5000 UNIT/ML IJ SOLN: 5000.0000 [IU] | Freq: Three times a day (TID) | INTRAMUSCULAR | Status: DC

## 2014-06-15 MED ORDER — GLUCERNA SHAKE PO LIQD
237.0000 mL | Freq: Two times a day (BID) | ORAL | Status: DC
  Administered 2014-06-16 (×2): 237 mL via ORAL
  Filled 2014-06-15 (×4): qty 237

## 2014-06-15 MED ORDER — GLYCOPYRROLATE 0.2 MG/ML IJ SOLN
INTRAMUSCULAR | Status: DC | PRN
  Administered 2014-06-15: 0.2 mg via INTRAVENOUS
  Administered 2014-06-15: 0.6 mg via INTRAVENOUS

## 2014-06-15 MED ORDER — LACTATED RINGERS IV SOLN
INTRAVENOUS | Status: DC | PRN
  Administered 2014-06-15: 07:00:00 via INTRAVENOUS

## 2014-06-15 MED ORDER — GLIPIZIDE 2.5 MG HALF TABLET
2.5000 mg | ORAL_TABLET | Freq: Every day | ORAL | Status: DC
  Administered 2014-06-16 – 2014-06-20 (×5): 2.5 mg via ORAL
  Filled 2014-06-15 (×6): qty 1

## 2014-06-15 MED ORDER — HEPARIN SODIUM (PORCINE) 5000 UNIT/ML IJ SOLN
5000.0000 [IU] | Freq: Three times a day (TID) | INTRAMUSCULAR | Status: AC
  Administered 2014-06-15 – 2014-06-16 (×3): 5000 [IU] via SUBCUTANEOUS
  Filled 2014-06-15 (×3): qty 1

## 2014-06-15 MED ORDER — CEFOTETAN DISODIUM 2 G IJ SOLR
2.0000 g | INTRAMUSCULAR | Status: AC
  Administered 2014-06-15: 2 g via INTRAVENOUS

## 2014-06-15 MED ORDER — HEPARIN SODIUM (PORCINE) 5000 UNIT/ML IJ SOLN
5000.0000 [IU] | Freq: Once | INTRAMUSCULAR | Status: AC
Start: 2014-06-15 — End: 2014-06-15
  Administered 2014-06-15: 5000 [IU] via SUBCUTANEOUS
  Filled 2014-06-15: qty 1

## 2014-06-15 MED ORDER — CISATRACURIUM BESYLATE (PF) 10 MG/5ML IV SOLN
INTRAVENOUS | Status: DC | PRN
  Administered 2014-06-15 (×2): 8 mg via INTRAVENOUS

## 2014-06-15 MED ORDER — BUPIVACAINE-EPINEPHRINE 0.25% -1:200000 IJ SOLN
INTRAMUSCULAR | Status: DC | PRN
  Administered 2014-06-15: 50 mL

## 2014-06-15 MED ORDER — LIDOCAINE HCL (CARDIAC) 20 MG/ML IV SOLN
INTRAVENOUS | Status: DC | PRN
  Administered 2014-06-15: 50 mg via INTRAVENOUS

## 2014-06-15 MED ORDER — HYDROMORPHONE HCL 1 MG/ML IJ SOLN
INTRAMUSCULAR | Status: DC | PRN
  Administered 2014-06-15 (×4): 0.5 mg via INTRAVENOUS

## 2014-06-15 SURGICAL SUPPLY — 125 items
ADAPTER GOLDBERG URETERAL (ADAPTER) ×4 IMPLANT
APPLIER CLIP 5 13 M/L LIGAMAX5 (MISCELLANEOUS)
APPLIER CLIP ROT 10 11.4 M/L (STAPLE)
APR CLP MED LRG 11.4X10 (STAPLE)
APR CLP MED LRG 5 ANG JAW (MISCELLANEOUS)
BLADE EXTENDED COATED 6.5IN (ELECTRODE) ×4 IMPLANT
BLADE SURG SZ11 CARB STEEL (BLADE) ×4 IMPLANT
CABLE HIGH FREQUENCY MONO STRZ (ELECTRODE) IMPLANT
CANNULA REDUC XI 12-8 STAPL (CANNULA) ×1
CANNULA REDUC XI 12-8MM STAPL (CANNULA) ×1
CANNULA REDUCER 12-8 DVNC XI (CANNULA) ×2 IMPLANT
CATH INTERMIT  6FR 70CM (CATHETERS) ×8 IMPLANT
CATH KIT ON Q 7.5IN SLV (PAIN MANAGEMENT) ×8 IMPLANT
CATH ROBINSON RED A/P 22FR (CATHETERS) IMPLANT
CELLS DAT CNTRL 66122 CELL SVR (MISCELLANEOUS) IMPLANT
CHLORAPREP W/TINT 26ML (MISCELLANEOUS) ×4 IMPLANT
CLIP APPLIE 5 13 M/L LIGAMAX5 (MISCELLANEOUS) IMPLANT
CLIP APPLIE ROT 10 11.4 M/L (STAPLE) IMPLANT
CLIP LIGATING HEM O LOK PURPLE (MISCELLANEOUS) IMPLANT
CLIP LIGATING HEMO O LOK GREEN (MISCELLANEOUS) IMPLANT
CLIP LIGATING HEMOLOK MED (MISCELLANEOUS) IMPLANT
COVER MAYO STAND STRL (DRAPES) ×4 IMPLANT
COVER TIP SHEARS 8 DVNC (MISCELLANEOUS) ×2 IMPLANT
COVER TIP SHEARS 8MM DA VINCI (MISCELLANEOUS) ×2
DECANTER SPIKE VIAL GLASS SM (MISCELLANEOUS) ×4 IMPLANT
DEVICE TROCAR PUNCTURE CLOSURE (ENDOMECHANICALS) IMPLANT
DRAIN CHANNEL 19F RND (DRAIN) IMPLANT
DRAPE ARM DVNC X/XI (DISPOSABLE) ×8 IMPLANT
DRAPE COLUMN DVNC XI (DISPOSABLE) ×2 IMPLANT
DRAPE DA VINCI XI ARM (DISPOSABLE) ×8
DRAPE DA VINCI XI COLUMN (DISPOSABLE) ×2
DRAPE SHEET LG 3/4 BI-LAMINATE (DRAPES) ×8 IMPLANT
DRAPE SURG IRRIG POUCH 19X23 (DRAPES) ×4 IMPLANT
DRAPE WARM FLUID 44X44 (DRAPE) ×4 IMPLANT
DRSG OPSITE POSTOP 3X4 (GAUZE/BANDAGES/DRESSINGS) ×4 IMPLANT
DRSG OPSITE POSTOP 4X10 (GAUZE/BANDAGES/DRESSINGS) IMPLANT
DRSG OPSITE POSTOP 4X6 (GAUZE/BANDAGES/DRESSINGS) IMPLANT
DRSG OPSITE POSTOP 4X8 (GAUZE/BANDAGES/DRESSINGS) IMPLANT
DRSG TEGADERM 2-3/8X2-3/4 SM (GAUZE/BANDAGES/DRESSINGS) ×20 IMPLANT
DRSG TEGADERM 4X4.75 (GAUZE/BANDAGES/DRESSINGS) IMPLANT
ELECT PENCIL ROCKER SW 15FT (MISCELLANEOUS) ×4 IMPLANT
ELECT REM PT RETURN 9FT ADLT (ELECTROSURGICAL) ×4
ELECTRODE REM PT RTRN 9FT ADLT (ELECTROSURGICAL) ×2 IMPLANT
ENDOLOOP SUT PDS II  0 18 (SUTURE)
ENDOLOOP SUT PDS II 0 18 (SUTURE) IMPLANT
EVACUATOR SILICONE 100CC (DRAIN) IMPLANT
GAUZE SPONGE 2X2 8PLY STRL LF (GAUZE/BANDAGES/DRESSINGS) ×2 IMPLANT
GAUZE SPONGE 4X4 12PLY STRL (GAUZE/BANDAGES/DRESSINGS) IMPLANT
GLOVE BIO SURGEON STRL SZ 6.5 (GLOVE) ×6 IMPLANT
GLOVE BIO SURGEONS STRL SZ 6.5 (GLOVE) ×2
GLOVE BIOGEL M STRL SZ7.5 (GLOVE) ×4 IMPLANT
GLOVE BIOGEL PI IND STRL 7.0 (GLOVE) ×6 IMPLANT
GLOVE BIOGEL PI INDICATOR 7.0 (GLOVE) ×6
GLOVE ECLIPSE 8.0 STRL XLNG CF (GLOVE) ×12 IMPLANT
GLOVE INDICATOR 8.0 STRL GRN (GLOVE) ×12 IMPLANT
GLOVE SURG SS PI 7.0 STRL IVOR (GLOVE) ×8 IMPLANT
GOWN STRL REUS W/TWL XL LVL3 (GOWN DISPOSABLE) ×28 IMPLANT
KIT BASIN OR (CUSTOM PROCEDURE TRAY) ×8 IMPLANT
KIT PROCEDURE DA VINCI SI (MISCELLANEOUS)
KIT PROCEDURE DVNC SI (MISCELLANEOUS) IMPLANT
LEGGING LITHOTOMY PAIR STRL (DRAPES) ×4 IMPLANT
LUBRICANT JELLY K Y 4OZ (MISCELLANEOUS) ×4 IMPLANT
NEEDLE HYPO 22GX1.5 SAFETY (NEEDLE) ×4 IMPLANT
NEEDLE INSUFFLATION 14GA 120MM (NEEDLE) ×4 IMPLANT
PACK CARDIOVASCULAR III (CUSTOM PROCEDURE TRAY) ×4 IMPLANT
PACK CYSTO (CUSTOM PROCEDURE TRAY) ×4 IMPLANT
PACK GENERAL/GYN (CUSTOM PROCEDURE TRAY) ×4 IMPLANT
PEN SKIN MARKING BROAD (MISCELLANEOUS) ×4 IMPLANT
PORT LAP GEL ALEXIS MED 5-9CM (MISCELLANEOUS) IMPLANT
PUMP PAIN ON-Q (MISCELLANEOUS) ×4 IMPLANT
RTRCTR WOUND ALEXIS 18CM MED (MISCELLANEOUS)
SCISSORS LAP 5X45 EPIX DISP (ENDOMECHANICALS) ×4 IMPLANT
SCRUB PCMX 4 OZ (MISCELLANEOUS) ×4 IMPLANT
SEAL CANN UNIV 5-8 DVNC XI (MISCELLANEOUS) ×6 IMPLANT
SEAL XI 5MM-8MM UNIVERSAL (MISCELLANEOUS) ×6
SEALER TISSUE G2 STRG ARTC 35C (ENDOMECHANICALS) IMPLANT
SEALER VESSEL DA VINCI XI (MISCELLANEOUS) ×2
SEALER VESSEL EXT DVNC XI (MISCELLANEOUS) ×2 IMPLANT
SET IRRIG TUBING LAPAROSCOPIC (IRRIGATION / IRRIGATOR) ×4 IMPLANT
SLEEVE XCEL OPT CAN 5 100 (ENDOMECHANICALS) IMPLANT
SOLUTION ELECTROLUBE (MISCELLANEOUS) ×4 IMPLANT
SPONGE GAUZE 2X2 STER 10/PKG (GAUZE/BANDAGES/DRESSINGS) ×2
SPONGE LAP 18X18 X RAY DECT (DISPOSABLE) IMPLANT
STAPLER 45 BLU RELOAD XI (STAPLE) ×4 IMPLANT
STAPLER 45 BLUE RELOAD XI (STAPLE) ×4
STAPLER 45 GREEN RELOAD XI (STAPLE)
STAPLER 45 GRN RELOAD XI (STAPLE) IMPLANT
STAPLER CIRC CVD 29MM 37CM (STAPLE) ×4 IMPLANT
STAPLER SHEATH (SHEATH) ×2
STAPLER SHEATH ENDOWRIST DVNC (SHEATH) ×2 IMPLANT
SUCTION POOLE TIP (SUCTIONS) ×4 IMPLANT
SUT MNCRL AB 4-0 PS2 18 (SUTURE) ×4 IMPLANT
SUT PDS AB 1 CTX 36 (SUTURE) ×8 IMPLANT
SUT PDS AB 1 TP1 96 (SUTURE) IMPLANT
SUT PDS AB 2-0 CT2 27 (SUTURE) ×8 IMPLANT
SUT PROLENE 0 CT 2 (SUTURE) ×4 IMPLANT
SUT PROLENE 2 0 SH DA (SUTURE) IMPLANT
SUT SILK 2 0 (SUTURE) ×4
SUT SILK 2 0 SH (SUTURE) ×4 IMPLANT
SUT SILK 2 0 SH CR/8 (SUTURE) ×4 IMPLANT
SUT SILK 2-0 18XBRD TIE 12 (SUTURE) ×2 IMPLANT
SUT SILK 3 0 (SUTURE) ×2
SUT SILK 3 0 SH CR/8 (SUTURE) ×4 IMPLANT
SUT SILK 3-0 18XBRD TIE 12 (SUTURE) ×2 IMPLANT
SUT V-LOC BARB 180 2/0GR6 GS22 (SUTURE)
SUT VIC AB 3-0 SH 18 (SUTURE) IMPLANT
SUT VIC AB 3-0 SH 27 (SUTURE)
SUT VIC AB 3-0 SH 27XBRD (SUTURE) IMPLANT
SUT VICRYL 0 UR6 27IN ABS (SUTURE) ×4 IMPLANT
SUT VLOC 180 2-0 9IN GS21 (SUTURE) IMPLANT
SUTURE V-LC BRB 180 2/0GR6GS22 (SUTURE) IMPLANT
SYR 20CC LL (SYRINGE) ×4 IMPLANT
SYRINGE 10CC LL (SYRINGE) ×4 IMPLANT
SYS LAPSCP GELPORT 120MM (MISCELLANEOUS)
SYSTEM LAPSCP GELPORT 120MM (MISCELLANEOUS) IMPLANT
TAPE UMBILICAL COTTON 1/8X30 (MISCELLANEOUS) ×4 IMPLANT
TOWEL OR 17X26 10 PK STRL BLUE (TOWEL DISPOSABLE) ×8 IMPLANT
TOWEL OR NON WOVEN STRL DISP B (DISPOSABLE) ×4 IMPLANT
TRAY FOLEY CATH 14FRSI W/METER (CATHETERS) IMPLANT
TROCAR BLADELESS OPT 5 100 (ENDOMECHANICALS) ×4 IMPLANT
TUBING CONNECTING 10 (TUBING) IMPLANT
TUBING CONNECTING 10' (TUBING)
TUBING FILTER THERMOFLATOR (ELECTROSURGICAL) ×4 IMPLANT
TUNNELER SHEATH ON-Q 16GX12 DP (PAIN MANAGEMENT) IMPLANT
YANKAUER SUCT BULB TIP 10FT TU (MISCELLANEOUS) ×4 IMPLANT

## 2014-06-15 NOTE — Op Note (Signed)
06/15/2014  12:05 PM  PATIENT:  Douglas Ballard  63 y.o. male  Patient Care Team: Stephens Shire, MD as PCP - General (Family Medicine) Bernestine Amass, MD as Consulting Physician (Urology) Michael Boston, MD as Consulting Physician (General Surgery) Johnn Hai, MD as Consulting Physician (Orthopedic Surgery)  PRE-OPERATIVE DIAGNOSIS:  colovesical fistula  POST-OPERATIVE DIAGNOSIS:  colovesical fistula  PROCEDURE:  Procedure(s): MINMALLY INVASIVE(ROBOTIC/LAPAROSCOPIC)SIGMONID COLECTOMY ROBOTIC TAKEDOWN COLOVESICULAR FISTULA OMENTOPEXY RIGID PROCTOSCOPY  SURGEON:  Surgeon(s): Michael Boston, MD  Leighton Ruff, MD - Assist  ANESTHESIA:   local and general  EBL:  Total I/O In: 2000 [I.V.:2000] Out: 245 [Urine:145; Blood:100]  Delay start of Pharmacological VTE agent (>24hrs) due to surgical blood loss or risk of bleeding:  no  DRAINS: none   SPECIMEN:  Source of Specimen:  Sigmoid colon (open end proximal)  DISPOSITION OF SPECIMEN:  PATHOLOGY  COUNTS:  YES  PLAN OF CARE: Admit to inpatient   PATIENT DISPOSITION:  PACU - hemodynamically stable.  INDICATION:    Pleasant gentleman with severe episode of urosepsis and diverticulitis with colovesical fistula.  On chronic antibiotics.  Endoscopy revealed no source of cancer.  I recommended segmental resection:  The anatomy & physiology of the digestive tract was discussed.  The pathophysiology was discussed.  Natural history risks without surgery was discussed.   I worked to give an overview of the disease and the frequent need to have multispecialty involvement.  I feel the risks of no intervention will lead to serious problems that outweigh the operative risks; therefore, I recommended a partial colectomy to remove the pathology.  Laparoscopic & open techniques were discussed.   Risks such as bleeding, infection, abscess, leak, reoperation, possible ostomy, hernia, heart attack, death, and other risks were discussed.  I  noted a good likelihood this will help address the problem.   Goals of post-operative recovery were discussed as well.  We will work to minimize complications.  Educational materials on the pathology had been given in the office.  Questions were answered.    The patient expressed understanding & wished to proceed with surgery.  OR FINDINGS:   Patient had very inflamed sigmoid colon densely adherent to the left ureter and at the ureteropelvic junction of the left side of the bladder.  No definite fistulous opening remaining in the bladder.  No obvious metastatic disease on visceral parietal peritoneum or liver.  The anastomosis rests 13 cm from the anal verge by rigid proctoscopy.  DESCRIPTION:   Informed consent was confirmed.  The patient underwent general anaesthesia without difficulty.  The patient was positioned appropriately.  VTE prevention in place.  The patient's abdomen was clipped, prepped, & draped in a sterile fashion.  Surgical timeout confirmed our plan.  Ureteral stents had been placed by cystoscopy by Dr. Risa Grill.  Please see his OR note for further details.  The patient was positioned in reverse Trendelenburg.  Abdominal entry was gained using optical entry technique in the right upper abdomen.  Entry was clean.  I induced carbon dioxide insufflation.  Camera inspection revealed no injury.  Extra ports were carefully placed under direct laparoscopic visualization.  I did laparoscopic adhesions to free greater omental adhesions to the anterior abdominal wall. I reflected the greater omentum and the upper abdomen the small bowel in the upper abdomen.  I had to mobilize the ileocecal the lateral medial fashion to help the small bowel stay out of the pelvis.  The patient was carefully positioned.  The Intuitive daVinci  robot was carefully docked with camera & instruments carefully placed.  The patient had a very inflamed segment of shortened sigmoid colon densely adherent to the left  pelvic brim and bladder.  I scored the base of peritoneum of the medial side of the mesentery of the left colon from the ligament of Treitz to the peritoneal reflection of the mid rectum.   I elevated the sigmoid mesentery and entered into the retro-mesenteric plane. We were able to identify the left ureter and gonadal vessels. We kept those posterior within the retroperitoneum and elevated the left colon mesentery off that. I did isolated IMA pedicle but did not ligate it yet.  I continued distally and got into the avascular plane posterior to the mesorectum. This allowed me to help mobilize the rectum as well by freeing the mesorectum off the sacrum.  I mobilized the peritoneal coverings towards the peritoneal reflection on both the right and left sides of the rectum.  I stayed away from the right and left ureters.  I kept the lateral vascular pedicles to the rectum intact.  We spent some time carefully freeing the inflamed sigmoid colon off the left pelvic brim and bladder.  We can identify the left ureter gonadal vessels and carefully free the sigmoid colon off this region.  It was densely adherent to the bladder at the left ureteral line insertion.  We stayed close on the colon side and carefully freed that off intact.  We saw no opening in the bladder and no exposed ureteral stent.  With that we had much more mobility to get the rectosigmoid colon out of the pelvis.  I skeletonized the lymph nodes off the inferior mesenteric artery pedicle.  I went down to its takeoff from the aorta.  I isolated the inferior mesenteric vein off of the ligament of Treitz just cephalad to that as well.  After confirming the left ureter was out of the way, I went ahead and ligated the inferior mesenteric artery pedicle just near its takeoff from the aorta.  I did ligate the inferior mesenteric vein in a similar fashion later to get more mobility of the left colon to reach down in the pelvis since inflammation had somewhat  shorten the left colon and especially sigmoid mesentery.  We ensured hemostasis. I skeletonized the mesorectum at the junction at the proximal rectum for the distal point of resection.  I mobilized the left colon in a lateral to medial fashion off the line of Toldt up towards the splenic flexure to ensure good mobilization of the remaining left colon to reach into the pelvis.  There was some increased thickening consistent with his prior retroperitoneal dissection from his prior robotic prostatectomy.  Not severe.  Eventually freed the left colon mesentery off the retroperitoneum.  We chose a region in the mid descending colon that reached down into the pelvis and transected the left colon mesentery radially.  I skeletonized at the proximal/mid rectum & transected across side using 2 firings of the robotic 35mm stapler.  I placed a wound protector through a 5cm Pfannenstiel incision in the suprapubic region, taking care to avoid bladder injury.I was able to eviscerate the rectosigmoid and descending colon out the wound.  I found the mid descending colon that was soft and  reached down. I clamped the colon proximal to this area using a soft bowel clamp. I transected at the descending/sigmoid junction with a scalpel. I got healthy bleeding mucosa. I transected the remaining specimen mesentery in a radial  fashion to preserve good blood supply to the proximal colon end.  We sent the rectosigmoid colon specimen off to go to pathology.  We sized the colon orifice.  I chose a 29 EEA anvil stapler system. I placed the anvil to the open end of the proximal remaining colon and closed around it using a 0 Prolene pursestring.  We did copious irrigation with crystalloid solution.  Hemostasis was good.  The distal end of the remaining colon easily reached down to the rectal stump, therefore, splenic flexure mobilization was  not needed.      Dr Marcello Moores scrubbed down and did gentle anal dilation and advanced the EEA stapler  up the rectal stump. The spike was brought out at the provimal end of the rectal stump under direct visualization.  I attached the anvil of the proximal colon the spike of the stapler. Anvil was tightened down and held clamped for 60 seconds. The EEA stapler was fired and held clamped for 30 seconds. The stapler was released & removed. We noted 2 excellent anastomotic rings. Blue stitch is in the proximal ring.  Dr Marcello Moores did rigid proctoscopy noted the anastomosis was at 13 cm from the anal verge consistent with the proximal rectum.  We did a final irrigation of antibiotic solution (900 mg clindamycin/240 mg gentamicin in a liter of crystalloid) & held that for the pelvic air leak test .  The rectum was insufflated the rectum while clamping the colon proximal to that anastomosis.  There was a negative air leak test. There was no tension of mesentery or bowel at the anastomosis.   Tissues looked viable.  Endoluminal gas was evacuated.  We changed gloves.  We aspirated the antibiotic irrigation.  Hemostasis was good.   Ureters & bowel uninjured.  The anastomosis looked a little thinned out anteriorly.  Inspection noted that it was just thin rectal wall but no breach.  I did bring a swath of omentum and laid it over the anastomosis.  I tacked it is an omentopexy using 2-0 silk interrupted sutures in the left anterolateral right anterolateral rectum as well as the colon proximal anastomosis.  Tied that down for a nice omental patch this also provided tissue between the anastomosis and the inflamed ureter and bladder.  We carefully inspected the bladder and did not identify any leak nor persistent fistulous opening..  We changed gown and gloves.  The patient was re-draped.  Sterile unused instruments were used from this point out per colon SSI prevention protocol.   I placed On-Q catheter and sheaths into the preperitoneal space under direct palpation.  I closed the 12 mm stapler port site with 0 Vicryl suture at  the fascia.  I closed the port sites using Monocryl stitch and sterile dressing.  I closed the Pfannenstiel wound using a 0 Vicryl vertical peritoneal closure and a #1 PDS transverse anterior rectal fascial closure. I closed the skin with some interrupted Monocryl stitches. I placed antibiotic-soaked wicks into the closure at the corners & centrally x4 between those areas. I placed a sterile dressing.  OnQ catheters placed & sheaths peeled away.  Patient is being extubated go to recovery room. I discussed postop care with the patient in detail the office & in the holding area. Instructions are written.  I'm about to locate family and discuss it with them as well.  [name], M.D., F.A.C.S. Gastrointestinal and Minimally Invasive Surgery [facility] [address] [place], [place] [address] [phone]  [phone] Main / Paging

## 2014-06-15 NOTE — Interval H&P Note (Signed)
History and Physical Interval Note:  06/15/2014 7:23 AM  Douglas Ballard  has presented today for surgery, with the diagnosis of colovesical fistula  The various methods of treatment have been discussed with the patient and family. After consideration of risks, benefits and other options for treatment, the patient has consented to  Procedure(s): MINMALLY INVASIVE(ROBOTIC/LAPAROSCOPIC)SIGMONID COLECTOMY AND COLOVESICULAR FISTULA REPAIR (N/A) RIGID PROCTOSCOPY (N/A) CYSTOSCOPY WITH BILATERAL STENT PLACEMENT (Bilateral) as a surgical intervention .  The patient's history has been reviewed, patient examined, no change in status, stable for surgery.  I have reviewed the patient's chart and labs.  Questions were answered to the patient's satisfaction.     Douglas Ballard C.

## 2014-06-15 NOTE — Transfer of Care (Signed)
Immediate Anesthesia Transfer of Care Note  Patient: Douglas Ballard  Procedure(s) Performed: Procedure(s): MINMALLY INVASIVE(ROBOTIC/LAPAROSCOPIC)SIGMONID COLECTOMY AND takedown COLOVESICULAR FISTULA, omentopexy (N/A) RIGID PROCTOSCOPY (N/A) CYSTOSCOPY WITH BILATERAL STENT PLACEMENT (Bilateral)  Patient Location: PACU  Anesthesia Type:General  Level of Consciousness: awake, alert , oriented and patient cooperative  Airway & Oxygen Therapy: Patient Spontanous Breathing and Patient connected to face mask oxygen  Post-op Assessment: Report given to PACU RN and Post -op Vital signs reviewed and stable  Post vital signs: Reviewed and stable  Complications: No apparent anesthesia complications

## 2014-06-15 NOTE — Anesthesia Postprocedure Evaluation (Signed)
  Anesthesia Post-op Note  Patient: Douglas Ballard  Procedure(s) Performed: Procedure(s) (LRB): MINMALLY INVASIVE(ROBOTIC/LAPAROSCOPIC)SIGMONID COLECTOMY AND takedown COLOVESICULAR FISTULA, omentopexy (N/A) RIGID PROCTOSCOPY (N/A) CYSTOSCOPY WITH BILATERAL STENT PLACEMENT (Bilateral)  Patient Location: PACU  Anesthesia Type: General  Level of Consciousness: awake and alert   Airway and Oxygen Therapy: Patient Spontanous Breathing  Post-op Pain: mild  Post-op Assessment: Post-op Vital signs reviewed, Patient's Cardiovascular Status Stable, Respiratory Function Stable, Patent Airway and No signs of Nausea or vomiting  Last Vitals:  Filed Vitals:   06/15/14 1330  BP: 146/68  Pulse: 63  Temp:   Resp: 10    Post-op Vital Signs: stable   Complications: No apparent anesthesia complications

## 2014-06-15 NOTE — H&P (Addendum)
[name] [place], [place] [id number] Phone: [phone] FAX: [phone]     Douglas Ballard  Nov 13, 1950 IN:[id number]  CARE TEAM:  PCP: Stephens Shire, MD  Outpatient Care Team: Patient Care Team: Stephens Shire, MD as PCP - General (Family Medicine) Bernestine Amass, MD as Consulting Physician (Urology) Michael Boston, MD as Consulting Physician (General Surgery) Johnn Hai, MD as Consulting Physician (Orthopedic Surgery)  Inpatient Treatment Team: Treatment Team: Attending Provider: Michael Boston, MD  This patient is a 63 y.o.male who presents today for surgical evaluation.   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  He was admitted and recovered.  Still has fistula presumed secondary to diverticulitis.  He comes in today ready for surgery.  Colonoscopy with no cancer yesterday.  No more UTI/urosepsis.  Cardiac clearance done.  Xerelto held [employer].  No major changes since last notes  Past Medical History  Diagnosis Date  . Hypertension   . Headache(784.0)   . Pneumonia   . 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   Marland Kitchen PNA (pneumonia) 06/26/2011  . Compression fracture of thoracic vertebra 06/26/2011  . Fistula, bladder   . Colon polyps   . Diabetes mellitus     diet controlled  . Cancer 2012    Prostate surgery  . 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 Atrial fibrillation     one episode sept 2015    Past Surgical History  Procedure Laterality Date  . Robot assisted laparoscopic radical prostatectomy  01/31/2011    Robotic-assisted laparoscopic radical retropubic  . Lipoma excision  2012    Dr Nonah Mattes  . Prostatectomy  01/2011  . Colonoscopy N/A 06/14/2014    Procedure:  COLONOSCOPY;  Surgeon: Gatha Mayer, MD;  Location: [facility];  Service: Endoscopy;  Laterality: N/A;    History   Social History  . Marital Status: Married    Spouse Name: N/A    Number of Children: 2  . 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 Smoking status: Former Smoker -- 2.00 packs/day for 25 years    Types: Cigarettes    Quit date: 04/14/2014  . Smokeless tobacco: Never Used  . Alcohol Use: No     Comment: none since 04-14-2014  . Drug Use: No  . Sexual Activity: Not Currently   Other Topics Concern  . Not on file   Social History Narrative  . No narrative on file    Family History  Problem Relation Age of Onset  . Atrial fibrillation Father     onset 51s. Had pacemaker placed for sinus pause  . Prostate cancer Father   . Breast cancer Mother   . Lung cancer Mother   . Colon cancer Neg Hx   . Leukemia Brother   . Colon polyps Brother   . Diabetes Neg Hx     Current Facility-Administered Medications  Medication Dose Route Frequency Provider Last Rate Last Dose  . bupivacaine 0.25 % ON-Q pump DUAL CATH 300 mL  300 mL Other Continuous Michael Boston, MD      . cefoTEtan (CEFOTAN) 2 g in dextrose 5 %  50 mL IVPB  2 g Intravenous On Call to OR Michael Boston, MD      . chlorhexidine (HIBICLENS) 4 % liquid 4 application  60 mL Topical Once Michael Boston, MD       And  . Derrill Memo ON 06/16/2014] chlorhexidine (HIBICLENS) 4 % liquid 4 application  60 mL Topical Once Michael Boston, MD      . clindamycin (CLEOCIN) 900 mg, gentamicin (GARAMYCIN) 240 mg in sodium chloride 0.9 % 1,000 mL for intraperitoneal lavage   Intraperitoneal Once Michael Boston, MD       Facility-Administered Medications Ordered in Other Encounters  Medication Dose Route Frequency Provider Last Rate Last Dose  . lactated ringers infusion    Continuous PRN Glory Buff, CRNA         Allergies  Allergen Reactions  .  Hydrochlorothiazide     Pt gets hyponatremia  . Lasix [Furosemide]     Sodium levels drop when take  . Nsaids     Kidney disease/failure  . Sulfa Antibiotics Other (See Comments)    headaches    ROS: Constitutional:  No fevers, chills, sweats.  Weight stable Eyes:  No vision changes, No discharge HENT:  No sore throats, nasal drainage Lymph: No neck swelling, No bruising easily Pulmonary:  No cough, productive sputum CV: No orthopnea, PND  No exertional chest/neck/shoulder/arm pain. GI:  No personal nor family history of GI/colon cancer, inflammatory bowel disease, irritable bowel syndrome, allergy such as Celiac Sprue, dietary/dairy problems, colitis, ulcers nor gastritis.  No recent sick contacts/gastroenteritis.  No travel outside the country.  No changes in diet. Renal: No UTIs, No hematuria Genital:  No drainage, bleeding, masses Musculoskeletal: No severe joint pain.  Good ROM major joints Skin:  No sores or lesions.  No rashes Heme/Lymph:  No easy bleeding.  No swollen lymph nodes Neuro: No focal weakness/numbness.  No seizures Psych: No suicidal ideation.  No hallucinations  BP 96/59  Pulse 70  Temp(Src) 98 F (36.7 C) (Oral)  Resp 18  Ht 5\' 8"  (1.727 m)  Wt 167 lb (75.751 kg)  BMI 25.40 kg/m2  SpO2 99%  Physical Exam: General: Pt awake/alert/oriented x4 in no major acute distress Eyes: PERRL, normal EOM. Sclera nonicteric Neuro: CN II-XII intact w/o focal sensory/motor deficits. Lymph: No head/neck/groin lymphadenopathy Psych:  No delerium/psychosis/paranoia HENT: Normocephalic, Mucus membranes moist.  No thrush Neck: Supple, No tracheal deviation Chest: No pain.  Good respiratory excursion. CV:  Pulses intact.  Regular rhythm Abdomen: Soft, Nondistended.  Nontender.  No incarcerated hernias. Ext:  SCDs BLE.  No significant edema.  No cyanosis Skin: No petechiae / purpurea.  No major sores Musculoskeletal: No severe joint pain.  Good ROM major  joints   Results:   Labs: Results for orders placed during the hospital encounter of 06/15/14 (from the past 48 hour(s))  GLUCOSE, CAPILLARY     Status: Abnormal   Collection Time    06/15/14  5:42 AM      Result Value Ref Range   Glucose-Capillary 130 (*) 70 - 99 mg/dL   Comment 1 Notify RN       Imaging / Studies: Dg Chest 2 View  06/10/2014   CLINICAL DATA:  Hypertension.  EXAM: CHEST  2 VIEW  COMPARISON:  April 18, 2014.  FINDINGS: The heart size and mediastinal contours are within normal limits. No pneumothorax or pleural effusion is noted. Lungs are clear. Stable compression deformity of upper thoracic vertebral body consistent with old fracture.  IMPRESSION: No acute cardiopulmonary abnormality seen.   Electronically Signed   By: Sabino Dick M.D.   On: 06/10/2014 16:23    Diagnostic report text  CLINICAL DATA: History of prostate cancer. Status post prostatectomy. Chronic cystitis.  EXAM: CT ABDOMEN AND PELVIS WITH CONTRAST  TECHNIQUE: Multidetector CT imaging of the abdomen and pelvis was performed using the standard protocol following bolus administration of intravenous contrast.  CONTRAST: 100 cc Isovue 300.  COMPARISON: [facility] CT from 02/07/07.  FINDINGS: Lung Bases: Unremarkable.  Liver: Steatosis without focal parenchymal abnormality.  Spleen: Normal.  Stomach: Normal.  Pancreas: Normal.  Gallbladder/Biliary: No evidence gallstones. No intra or extrahepatic biliary duct dilatation.  Kidneys/Adrenals: No adrenal nodule or mass. No definite stones in either kidney or ureter. There is focally increased attenuation in the renal papillae of both kidneys which may be related to highly concentrated urine or Randal's plaques. The density of the papillae is not as high or extensive as typically seen for medullary nephrocalcinosis. Imaging after IV contrast administration shows some scattered areas focal cortical scarring in the left  kidney. There is no enhancing renal mass. No hydronephrosis. No collecting system abnormality is seen on delayed imaging. Left ureter is well opacified throughout the has normal features. The right ureter is well opacified except for some segments in the distal third. The opacified segments are normal in appearance. There is diffuse mild wall irregularity in the bladder. There is some focal thickening of the bladder wall along the left aspect of the dome. The abnormal sigmoid colon (see below) passes immediately over this region in the bladder and is contiguous with the bladder. There is an air bubble that appears to be positioned immediately adjacent to or potentially in the bladder wall at this level (see coronal image 86 of series 953 for example). This appearance is highly suggestive of the presence of a colovesical fistula although slice thickness of this exam limits resolution on the coronal and sagittal 2D reformation, somewhat hindering definitive assessment. The patient does have gas in the bladder lumen which would also be consistent with the presence of a colovesical fistula.  Bowel Loops: No small bowel obstruction. No evidence for small bowel wall thickening. The terminal ileum and the appendix are normal. Right and transverse colon have normal imaging features. Diverticular changes are seen in the left colon and there is relatively diffuse wall thickening in the sigmoid colon likely related to diverticulosis. As described above, the abnormal. Sigmoid colon is contiguous with the bladder dome.  Nodes: 1.3 x 2.0 cm left para-aortic lymph node seen on image 30 of series 2 measured 0.9 x 1.5 cm on the previous study. 1.3 x 2.1 cm left para-aortic lymph node on image 36 was 1.3 x 2.1 cm previously. No gastrohepatic or hepatoduodenal ligament lymphadenopathy. No evidence for pelvic sidewall lymphadenopathy.  Vasculature: Atherosclerotic calcification is noted in the wall  of the abdominal aorta without aneurysm.  Pelvic Genitourinary: Prostate gland is surgically absent.  Bones/Musculoskeletal: Bone windows reveal no worrisome lytic or sclerotic osseous lesions.  Body Wall: Small bilateral inguinal hernias, right greater than left, contain only fat.  Other: There is a trace amount of intraperitoneal free fluid. There is edema/ inflammation in the perirenal fat bilaterally which can be an nonacute finding. Similar features were seen on a CT chest from 06/26/2011. This retroperitoneal fat stranding extends caudally in the retroperitoneal space into the pelvic sidewalls bilaterally.  IMPRESSION: 1. The most prominent finding on this study is an apparent colovesical  fistula between the sigmoid colon and left bladder dome. Resolution of coronal and sagittal reformations is limited by slice thickness on this exam which does limit definitive assessment by CT. 2. Mild retroperitoneal lymphadenopathy in the abdomen, involving the left para-aortic space. Given that this was present on the study from 7 years ago and is stable to only minimally increased, it is most likely benign and chronic. 3. No mass lesion in either kidney. There is no evidence for a collecting system or ureteral abnormality on either side.   Electronically Signed By: Misty Stanley M.D. On: 02/24/2014 12:06   Embedded Images (not for diagnostic purposes)     Signs and Symptoms:    History:    Comments:  next 1-2 weeks will call with results~Diab-Diet control  Visit Pt Loc:  [facility]     Medications / Allergies: per chart  Antibiotics: Anti-infectives   Start     Dose/Rate Route Frequency Ordered Stop   06/15/14 0612  cefoTEtan (CEFOTAN) 2 g in dextrose 5 % 50 mL IVPB     2 g 100 mL/hr over 30 Minutes Intravenous On call to O.R. 06/15/14 0612 06/16/14 0559   06/15/14 0600  clindamycin (CLEOCIN) 900 mg, gentamicin (GARAMYCIN) 240 mg in sodium chloride 0.9 % 1,000 mL for  intraperitoneal lavage    Comments:  Pharmacy may adjust dosing strength, schedule, rate of infusion, etc as needed to optimize therapy    Intraperitoneal  Once 06/14/14 1435        Assessment  Deyvi Noberto Retort  63 y.o. 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  Ureteral stent placement by urology 1st:  The anatomy & physiology of the digestive tract was discussed.  The pathophysiology of  fistula between the bowel and bladder was discussed.  Natural history risks without surgery was discussed. I worked to give an overview of the disease and the frequent need to have multispecialty involvement.   I feel the risks of no intervention will lead to serious problems that outweigh the operative risks; therefore, I recommended surgery to treat the pathology.  Laparoscopic & open techniques for partial proctocolectomy with bladder repair were discussed.  Possible fecal diversion by ostomy was discussed.  We will work to preserve anal & pelvic floor function without sacrificing cure.  Need for prolonged bladder catheterization was discussed.  Risks such as bleeding, infection, abscess, leak, recurrence with reoperation, possible ostomy, hernia, heart attack, death, and other risks were discussed.  I noted a good likelihood this will help address the problem.   Goals of post-operative recovery were discussed as well.  We will work to minimize complications.  An educational handout on the pathology was given as well.  Questions were answered.    The patient expresses understanding & wishes to proceed with surgery.  -VTE prophylaxis- SCDs,  etc -mobilize as tolerated to help recovery    [name], M.D., F.A.C.S. Gastrointestinal and Minimally Invasive Surgery [facility] [address] [place], [place] [address] [phone] Main / Paging   06/15/2014  Note: Portions of this report may have been transcribed using voice recognition software. Every effort was made to ensure accuracy; however, inadvertent computerized transcription errors may be present.  Any transcriptional errors that result from this process are unintentional.

## 2014-06-15 NOTE — Discharge Instructions (Signed)
ABDOMINAL SURGERY: POST OP INSTRUCTIONS ° °1. DIET: Follow a light bland diet the first 24 hours after arrival home, such as soup, liquids, crackers, etc.  Be sure to include lots of fluids daily.  Avoid fast food or heavy meals as your are more likely to get nauseated.  Eat a low fat the next few days after surgery.   °2. Take your usually prescribed home medications unless otherwise directed. °3. PAIN CONTROL: °a. Pain is best controlled by a usual combination of three different methods TOGETHER: °i. Ice/Heat °ii. Over the counter pain medication °iii. Prescription pain medication °b. Most patients will experience some swelling and bruising around the incisions.  Ice packs or heating pads (30-60 minutes up to 6 times a day) will help. Use ice for the first few days to help decrease swelling and bruising, then switch to heat to help relax tight/sore spots and speed recovery.  Some people prefer to use ice alone, heat alone, alternating between ice & heat.  Experiment to what works for you.  Swelling and bruising can take several weeks to resolve.   °c. It is helpful to take an over-the-counter pain medication regularly for the first few weeks.  Choose one of the following that works best for you: °i. Naproxen (Aleve, etc)  Two 220mg tabs twice a day °ii. Ibuprofen (Advil, etc) Three 200mg tabs four times a day (every meal & bedtime) °iii. Acetaminophen (Tylenol, etc) 500-650mg four times a day (every meal & bedtime) °d. A  prescription for pain medication (such as oxycodone, hydrocodone, etc) should be given to you upon discharge.  Take your pain medication as prescribed.  °i. If you are having problems/concerns with the prescription medicine (does not control pain, nausea, vomiting, rash, itching, etc), please call us [phone] to see if we need to switch you to a different pain medicine that will work better for you and/or control your side effect better. °ii. If you need a refill on your pain medication,  please contact your pharmacy.  They will contact our office to request authorization. Prescriptions will not be filled after 5 pm or on week-ends. °4. Avoid getting constipated.  Between the surgery and the pain medications, it is common to experience some constipation.  Increasing fluid intake and taking a fiber supplement (such as Metamucil, Citrucel, FiberCon, MiraLax, etc) 1-2 times a day regularly will usually help prevent this problem from occurring.  A mild laxative (prune juice, Milk of Magnesia, MiraLax, etc) should be taken according to package directions if there are no bowel movements after 48 hours.   °5. Watch out for diarrhea.  If you have many loose bowel movements, simplify your diet to bland foods & liquids for a few days.  Stop any stool softeners and decrease your fiber supplement.  Switching to mild anti-diarrheal medications (Kayopectate, Pepto Bismol) can help.  If this worsens or does not improve, please call us. °6. Wash / shower every day.  You may shower over the incision / wound.  Avoid baths until the skin is fully healed.  Continue to shower over incision(s) after the dressing is off. °7. Remove your waterproof bandages 5 days after surgery.  You may leave the incision open to air.  You may replace a dressing/Band-Aid to cover the incision for comfort if you wish. °8. ACTIVITIES as tolerated:   °a. You may resume regular (light) daily activities beginning the next day--such as daily self-care, walking, climbing stairs--gradually increasing activities as tolerated.  If you can   walk 30 minutes without difficulty, it is safe to try more intense activity such as jogging, treadmill, bicycling, low-impact aerobics, swimming, etc. b. Save the most intensive and strenuous activity for last such as sit-ups, heavy lifting, contact sports, etc  Refrain from any heavy lifting or straining until you are off narcotics for pain control.   c. DO NOT PUSH THROUGH PAIN.  Let pain be your guide: If it  hurts to do something, don't do it.  Pain is your body warning you to avoid that activity for another week until the pain goes down. d. You may drive when you are no longer taking prescription pain medication, you can comfortably wear a seatbelt, and you can safely maneuver your car and apply brakes. e. Dennis Bast may have sexual intercourse when it is comfortable.  9. FOLLOW UP in our office a. Please call [facility] at [phone] to set up an appointment to see your surgeon in the office for a follow-up appointment approximately 1-2 weeks after your surgery. b. Make sure that you call for this appointment the day you arrive home to insure a convenient appointment time. 10. IF YOU HAVE DISABILITY OR FAMILY LEAVE FORMS, BRING THEM TO THE OFFICE FOR PROCESSING.  DO NOT GIVE THEM TO YOUR DOCTOR.   WHEN TO CALL us [phone]: 1. Poor pain control 2. Reactions / problems with new medications (rash/itching, nausea, etc)  3. Fever over 101.5 F (38.5 C) 4. Inability to urinate 5. Nausea and/or vomiting 6. Worsening swelling or bruising 7. Continued bleeding from incision. 8. Increased pain, redness, or drainage from the incision  The clinic staff is available to answer your questions during regular business hours (8:30am-5pm).  Please dont hesitate to call and ask to speak to one of our nurses for clinical concerns.   A surgeon from [facility] is always on call at the hospitals   If you have a medical emergency, go to the nearest emergency room or call 911.    [facility], [place], [place], [place], [place]  [address] ? MAIN: [phone] ? TOLL FREE: [phone] ? FAX [phone] [email or web address]   GETTING TO GOOD BOWEL HEALTH. Irregular bowel habits such as constipation and diarrhea can lead to many problems over time.  Having one soft bowel movement a day is the most important way to prevent further problems.  The anorectal canal  is designed to handle stretching and feces to safely manage our ability to get rid of solid waste (feces, poop, stool) out of our body.  BUT, hard constipated stools can act like ripping concrete bricks and diarrhea can be a burning fire to this very sensitive area of our body, causing inflamed hemorrhoids, anal fissures, increasing risk is perirectal abscesses, abdominal pain/bloating, an making irritable bowel worse.     The goal: ONE SOFT BOWEL MOVEMENT A DAY!  To have soft, regular bowel movements:    Drink at least 8 tall glasses of water a day.     Take plenty of fiber.  Fiber is the undigested part of plant food that passes into the colon, acting s natures broom to encourage bowel motility and movement.  Fiber can absorb and hold large amounts of water. This results in a larger, bulkier stool, which is soft and easier to pass. Work gradually over several weeks up to 6 servings a day of fiber (25g a day even more if needed) in the form of: o Vegetables -- Root (potatoes, carrots,  turnips), leafy green (lettuce, salad greens, celery, spinach), or cooked high residue (cabbage, broccoli, etc) o Fruit -- Fresh (unpeeled skin & pulp), Dried (prunes, apricots, cherries, etc ),  or stewed ( applesauce)  o Whole grain breads, pasta, etc (whole wheat)  o Bran cereals    Bulking Agents -- This type of water-retaining fiber generally is easily obtained each day by one of the following:  o Psyllium bran -- The psyllium plant is remarkable because its ground seeds can retain so much water. This product is available as Metamucil, Konsyl, Effersyllium, Per Diem Fiber, or the less expensive generic preparation in drug and health food stores. Although labeled a laxative, it really is not a laxative.  o Methylcellulose -- This is another fiber derived from wood which also retains water. It is available as Citrucel. o Polyethylene Glycol - and artificial fiber commonly called Miralax or Glycolax.  It is helpful  for people with gassy or bloated feelings with regular fiber o Flax Seed - a less gassy fiber than psyllium   No reading or other relaxing activity while on the toilet. If bowel movements take longer than 5 minutes, you are too constipated   AVOID CONSTIPATION.  High fiber and water intake usually takes care of this.  Sometimes a laxative is needed to stimulate more frequent bowel movements, but    Laxatives are not a good long-term solution as it can wear the colon out. o Osmotics (Milk of Magnesia, Fleets phosphosoda, Magnesium citrate, MiraLax, GoLytely) are safer than  o Stimulants (Senokot, Castor Oil, Dulcolax, Ex Lax)    o Do not take laxatives for more than 7days in a row.    IF SEVERELY CONSTIPATED, try a Bowel Retraining Program: o Do not use laxatives.  o Eat a diet high in roughage, such as bran cereals and leafy vegetables.  o Drink six (6) ounces of prune or apricot juice each morning.  o Eat two (2) large servings of stewed fruit each day.  o Take one (1) heaping tablespoon of a psyllium-based bulking agent twice a day. Use sugar-free sweetener when possible to avoid excessive calories.  o Eat a normal breakfast.  o Set aside 15 minutes after breakfast to sit on the toilet, but do not strain to have a bowel movement.  o If you do not have a bowel movement by the third day, use an enema and repeat the above steps.    Controlling diarrhea o Switch to liquids and simpler foods for a few days to avoid stressing your intestines further. o Avoid dairy products (especially milk & ice cream) for a short time.  The intestines often can lose the ability to digest lactose when stressed. o Avoid foods that cause gassiness or bloating.  Typical foods include beans and other legumes, cabbage, broccoli, and dairy foods.  Every person has some sensitivity to other foods, so listen to our body and avoid those foods that trigger problems for you. o Adding fiber (Citrucel, Metamucil, psyllium,  Miralax) gradually can help thicken stools by absorbing excess fluid and retrain the intestines to act more normally.  Slowly increase the dose over a few weeks.  Too much fiber too soon can backfire and cause cramping & bloating. o Probiotics (such as active yogurt, Align, etc) may help repopulate the intestines and colon with normal bacteria and calm down a sensitive digestive tract.  Most studies show it to be of mild help, though, and such products can be costly. o Medicines:  Bismuth subsalicylate (ex. Kayopectate, Pepto Bismol) every 30 minutes for up to 6 doses can help control diarrhea.  Avoid if pregnant.   Loperamide (Immodium) can slow down diarrhea.  Start with two tablets (4mg  total) first and then try one tablet every 6 hours.  Avoid if you are having fevers or severe pain.  If you are not better or start feeling worse, stop all medicines and call your doctor for advice o Call your doctor if you are getting worse or not better.  Sometimes further testing (cultures, endoscopy, X-ray studies, bloodwork, etc) may be needed to help diagnose and treat the cause of the diarrhea.  Managing Pain  Pain after surgery or related to activity is often due to strain/injury to muscle, tendon, nerves and/or incisions.  This pain is usually short-term and will improve in a few months.   Many people find it helpful to do the following things TOGETHER to help speed the process of healing and to get back to regular activity more quickly:  1. Avoid heavy physical activity a.  no lifting greater than 20 pounds b. Do not push through the pain.  Listen to your body and avoid positions and maneuvers than reproduce the pain c. Walking is okay as tolerated, but go slowly and stop when getting sore.  d. Remember: If it hurts to do it, then dont do it! 2. Take Anti-inflammatory medication  a. Take with food/snack around the clock for 1-2 weeks i. This helps the muscle and nerve tissues become less irritable  and calm down faster ii. Choose Acetaminophen 500mg  tabs (Tylenol) 1-2 pills with every meal and just before bedtime 3. Use a Heating pad or Ice/Cold Pack a. 4-6 times a day b. May use warm bath/hottub  or showers 4. Try Gentle Massage and/or Stretching  a. at the area of pain many times a day b. stop if you feel pain - do not overdo it  Try these steps together to help you body heal faster and avoid making things get worse.  Doing just one of these things may not be enough.    If you are not getting better after two weeks or are noticing you are getting worse, contact our office for further advice; we may need to re-evaluate you & see what other things we can do to help.  Diverticulitis Diverticulitis is inflammation or infection of small pouches in your colon that form when you have a condition called diverticulosis. The pouches in your colon are called diverticula. Your colon, or large intestine, is where water is absorbed and stool is formed. Complications of diverticulitis can include:  Bleeding.  Severe infection.  Severe pain.  Perforation of your colon.  Obstruction of your colon. CAUSES  Diverticulitis is caused by bacteria. Diverticulitis happens when stool becomes trapped in diverticula. This allows bacteria to grow in the diverticula, which can lead to inflammation and infection. RISK FACTORS People with diverticulosis are at risk for diverticulitis. Eating a diet that does not include enough fiber from fruits and vegetables may make diverticulitis more likely to develop. SYMPTOMS  Symptoms of diverticulitis may include:  Abdominal pain and tenderness. The pain is normally located on the left side of the abdomen, but may occur in other areas.  Fever and chills.  Bloating.  Cramping.  Nausea.  Vomiting.  Constipation.  Diarrhea.  Blood in your stool. DIAGNOSIS  Your health care provider will ask you about your medical history and do a physical exam. You  may need  to have tests done because many medical conditions can cause the same symptoms as diverticulitis. Tests may include:  Blood tests.  Urine tests.  Imaging tests of the abdomen, including X-rays and CT scans. When your condition is under control, your health care provider may recommend that you have a colonoscopy. A colonoscopy can show how severe your diverticula are and whether something else is causing your symptoms. TREATMENT  Most cases of diverticulitis are mild and can be treated at home. Treatment may include:  Taking over-the-counter pain medicines.  Following a clear liquid diet.  Taking antibiotic medicines by mouth for 7-10 days. More severe cases may be treated at a hospital. Treatment may include:  Not eating or drinking.  Taking prescription pain medicine.  Receiving antibiotic medicines through an IV tube.  Receiving fluids and nutrition through an IV tube.  Surgery. HOME CARE INSTRUCTIONS   Follow your health care provider's instructions carefully.  Follow a full liquid diet or other diet as directed by your health care provider. After your symptoms improve, your health care provider may tell you to change your diet. He or she may recommend you eat a high-fiber diet. Fruits and vegetables are good sources of fiber. Fiber makes it easier to pass stool.  Take fiber supplements or probiotics as directed by your health care provider.  Only take medicines as directed by your health care provider.  Keep all your follow-up appointments. SEEK MEDICAL CARE IF:   Your pain does not improve.  You have a hard time eating food.  Your bowel movements do not return to normal. SEEK IMMEDIATE MEDICAL CARE IF:   Your pain becomes worse.  Your symptoms do not get better.  Your symptoms suddenly get worse.  You have a fever.  You have repeated vomiting.  You have bloody or black, tarry stools. MAKE SURE YOU:   Understand these instructions.  Will  watch your condition.  Will get help right away if you are not doing well or get worse. Document Released: 05/16/2005 Document Revised: 08/11/2013 Document Reviewed: 07/01/2013 [facility] Patient Information 2015 [place], [place]. This information is not intended to replace advice given to you by your health care provider. Make sure you discuss any questions you have with your health care provider.

## 2014-06-15 NOTE — Op Note (Signed)
Preoperative diagnosis:colovesical fistula Postoperative diagnosis:same  Procedure:cystoscopy with insertion of bilateral ureteral catheters   Surgeon: Bernestine Amass M.D.  Anesthesia: Gen.  Indications:Douglas Ballard is 63 years of age.  He has been a long-standing patient of mine with a history of prostate cancer approximately 3 years status post robotic prostatectomy.  He has no evidence of recurrent disease.  He recently developed recurrent cystitis and pneumaturia.  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  He now presents for definitive management with Dr. Richardson Landry gross.  He requested insertion of ureteral catheters to help with ureteral identification.     Technique and findings:patient was brought to the operating room where successful induction of general endotracheal anesthesia.  He is placed in lithotomy position and prepped and draped in usual manner.  A appropriate surgical timeout was performed.  Cystoscopy revealed a unremarkable urethra and bladder neck anastomosis.  Evaluation of the bladder revealed a half dollar size area of erythema with probable fistula in the midportion of his bladder.  Both ureteral orifices were easily identified.  Both ureters were intubated with an open-ended catheters.  Contrast was instilled primarily to confirm positioning of the stents.  6 [place] open-ended catheters were placed in both renal outline.  These were then secured to a collection bag device along with the Foley catheter.  Stents will most likely be removed at the completion of the operation by the operative personnel assuming no injuries to the ureters or concerns.

## 2014-06-15 NOTE — H&P (View-Only) (Signed)
I have reviewed the tests.  There are no major concerns.  Okay to proceed with surgery from my standpoint.

## 2014-06-16 ENCOUNTER — Encounter (HOSPITAL_COMMUNITY): Payer: Self-pay | Admitting: Surgery

## 2014-06-16 DIAGNOSIS — E1122 Type 2 diabetes mellitus with diabetic chronic kidney disease: Secondary | ICD-10-CM

## 2014-06-16 LAB — BASIC METABOLIC PANEL
Anion gap: 12 (ref 5–15)
BUN: 18 mg/dL (ref 6–23)
CALCIUM: 8.4 mg/dL (ref 8.4–10.5)
CO2: 22 mEq/L (ref 19–32)
Chloride: 104 mEq/L (ref 96–112)
Creatinine, Ser: 1.41 mg/dL — ABNORMAL HIGH (ref 0.50–1.35)
GFR calc non Af Amer: 52 mL/min — ABNORMAL LOW (ref >90)
GFR, EST AFRICAN AMERICAN: 60 mL/min — AB (ref >90)
Glucose, Bld: 145 mg/dL — ABNORMAL HIGH (ref 70–99)
Potassium: 4.3 mEq/L (ref 3.7–5.3)
Sodium: 138 mEq/L (ref 137–147)

## 2014-06-16 LAB — CBC
HCT: 27.2 % — ABNORMAL LOW (ref 39.0–52.0)
Hemoglobin: 9.5 g/dL — ABNORMAL LOW (ref 13.0–17.0)
MCH: 32.8 pg (ref 26.0–34.0)
MCHC: 34.9 g/dL (ref 30.0–36.0)
MCV: 93.8 fL (ref 78.0–100.0)
Platelets: 246 10*3/uL (ref 150–400)
RBC: 2.9 MIL/uL — ABNORMAL LOW (ref 4.22–5.81)
RDW: 12.2 % (ref 11.5–15.5)
WBC: 12.1 10*3/uL — ABNORMAL HIGH (ref 4.0–10.5)

## 2014-06-16 LAB — GLUCOSE, CAPILLARY
Glucose-Capillary: 173 mg/dL — ABNORMAL HIGH (ref 70–99)
Glucose-Capillary: 156 mg/dL — ABNORMAL HIGH (ref 70–99)
Glucose-Capillary: 154 mg/dL — ABNORMAL HIGH (ref 70–99)
Glucose-Capillary: 156 mg/dL — ABNORMAL HIGH (ref 70–99)

## 2014-06-16 LAB — MAGNESIUM: Magnesium: 1.6 mg/dL (ref 1.5–2.5)

## 2014-06-16 MED ORDER — HYDRALAZINE HCL 20 MG/ML IJ SOLN
20.0000 mg | INTRAMUSCULAR | Status: DC | PRN
Start: 2014-06-16 — End: 2014-06-20
  Administered 2014-06-16: 20 mg via INTRAVENOUS
  Filled 2014-06-16: qty 1

## 2014-06-16 NOTE — Progress Notes (Addendum)
[facility]  [address] [place], [place] [address] Phone: [phone] FAX: [phone]    Jerome Viglione [id number] [birth year]  CARE TEAM:  PCP: Stephens Shire, MD  Outpatient Care Team: Patient Care Team: Stephens Shire, MD as PCP - General (Family Medicine) Bernestine Amass, MD as Consulting Physician (Urology) Michael Boston, MD as Consulting Physician (General Surgery) Johnn Hai, MD as Consulting Physician (Orthopedic Surgery)  Inpatient Treatment Team: Treatment Team: Attending Provider: Michael Boston, MD; Registered Nurse: Loyal Gambler, RN; Technician: Darlyn Chamber, NT; Registered Nurse: Midge Minium, RN   Subjective:  Pain controlled Walked in hallways Small smear of BM Many questions  Objective:  Vital signs:  Filed Vitals:   06/15/14 2034 06/16/14 0331 06/16/14 0624 06/16/14 0641  BP: 152/71 165/73 191/83 162/88  Pulse: 93 83 89   Temp: 98.7 F (37.1 C) 99 F (37.2 C) 99.3 F (37.4 C)   TempSrc: Oral Oral Oral   Resp: '14 16 20   ' Height:      Weight:   172 lb 3.2 oz (78.109 kg)   SpO2: 100% 100% 99%     Last BM Date: 06/14/14  Intake/Output   Yesterday:  10/27 0701 - 10/28 0700 In: 3531.3 [P.O.:360; I.V.:3171.3] Out: 2145 [MRAJH:1834; Blood:100] This shift:  Total I/O In: 54 [P.O.:360; I.V.:375] Out: 1860 [Urine:1860]  Bowel function:  Flatus: n  BM: small  Drain: n/a  Physical Exam:  General: Pt awake/alert/oriented x4 in no acute distress Eyes: PERRL, normal EOM.  Sclera clear.  No icterus Neuro: CN II-XII intact w/o focal sensory/motor deficits. Lymph: No head/neck/groin lymphadenopathy Psych:  No delerium/psychosis/paranoia HENT: Normocephalic, Mucus membranes moist.  No thrush Neck: Supple, No tracheal deviation Chest: No chest wall pain w good excursion CV:  Pulses intact.  Regular rhythm MS: Normal AROM mjr joints.  No obvious deformity Abdomen: Soft.   Nondistended.  Mildly tender at incisions only.  No evidence of peritonitis.  No incarcerated hernias. Ext:  SCDs BLE.  No mjr edema.  No cyanosis Skin: No petechiae / purpura   Problem List:   Principal Problem:   Colovesical fistula s/p sigmoid colectomy Active Problems:   HTN (hypertension)   Alcohol abuse   Tobacco abuse   Diverticulosis of colon without hemorrhage   Diabetes mellitus type 2 in nonobese   Assessment  Douglas Ballard  63 y.o. male  1 Day Post-Op  Procedure(s): POST-OPERATIVE DIAGNOSIS: colovesical fistula from diverticulitis  PROCEDURE: Procedure(s):  MINMALLY INVASIVE(ROBOTIC/LAPAROSCOPIC)SIGMONID COLECTOMY  ROBOTIC TAKEDOWN COLOVESICULAR FISTULA  OMENTOPEXY  RIGID PROCTOSCOPY  SURGEON: Surgeon(s):  Michael Boston, MD  Leighton Ruff, MD - Assist   Recovering  Plan:  -liquids.  Adv diet per protocol -f/u pathology - most likely diverticulitis etiology -HTN - restart 1/2 of home meds w PRN backup -VTE prophylaxis- SCDs, heparin.  Switch back to Xerelto when tol solids PO & flatus & >48hrs out from surgery -DM control - glucose 130-140's.  SSI & restart glucerna -mobilize as tolerated to help recovery  I updated the status of the patient to the patient's family yesterday x15 min in person.  I made recommendations.  I answered questions.  Understanding & appreciation was expressed.  I updated the patient's status to the patient this AM including OR findings.  Recommendations were made.  Questions were answered.  The patient expressed understanding & appreciation.    [name], M.D., F.A.C.S. Gastrointestinal and Minimally Invasive Surgery [facility] [address], [place], [place] [address] [phone] Main / Paging   06/16/2014   Results:   Labs: Results for orders placed during the hospital encounter of 06/15/14 (from the past 48 hour(s))  GLUCOSE, CAPILLARY     Status: Abnormal   Collection  Time    06/15/14  5:42 AM      Result Value Ref Range   Glucose-Capillary 130 (*) 70 - 99 mg/dL   Comment 1 Notify RN    GLUCOSE, CAPILLARY     Status: Abnormal   Collection Time    06/15/14  5:25 PM      Result Value Ref Range   Glucose-Capillary 150 (*) 70 - 99 mg/dL  GLUCOSE, CAPILLARY     Status: Abnormal   Collection Time    06/15/14  9:06 PM      Result Value Ref Range   Glucose-Capillary 194 (*) 70 - 99 mg/dL   Comment 1 Documented in Chart     Comment 2 Notify RN    BASIC METABOLIC PANEL     Status: Abnormal   Collection Time    06/16/14  3:35 AM      Result Value Ref Range   Sodium 138  137 - 147 mEq/L   Potassium 4.3  3.7 - 5.3 mEq/L   Chloride 104  96 - 112 mEq/L   CO2 22  19 - 32 mEq/L   Glucose, Bld 145 (*) 70 - 99 mg/dL   BUN 18  6 - 23 mg/dL   Creatinine, Ser 1.41 (*) 0.50 - 1.35 mg/dL   Calcium 8.4  8.4 - 10.5 mg/dL   GFR calc non Af Amer 52 (*) >90 mL/min   GFR calc Af Amer 60 (*) >90 mL/min   Comment: (NOTE)     The eGFR has been calculated using the CKD EPI equation.     This calculation has not been validated in all clinical situations.     eGFR's persistently <90 mL/min signify possible Chronic Kidney     Disease.   Anion gap 12  5 - 15  CBC     Status: Abnormal   Collection Time    06/16/14  3:35 AM      Result Value Ref Range   WBC 12.1 (*) 4.0 - 10.5 K/uL   RBC 2.90 (*) 4.22 - 5.81 MIL/uL   Hemoglobin 9.5 (*) 13.0 - 17.0 g/dL   HCT 27.2 (*) 39.0 - 52.0 %   MCV 93.8  78.0 - 100.0 fL   MCH 32.8  26.0 - 34.0 pg   MCHC 34.9  30.0 - 36.0 g/dL   RDW 12.2  11.5 - 15.5 %   Platelets 246  150 - 400 K/uL  MAGNESIUM     Status: None   Collection Time    06/16/14  3:35 AM      Result Value Ref Range   Magnesium 1.6  1.5 - 2.5 mg/dL    Imaging / Studies: Dg C-arm 1-60 Min-no Report  06/15/2014   CLINICAL DATA: ureteral catheters   C-ARM 1-60 MINUTES  Fluoroscopy was utilized by the requesting physician.  No radiographic  interpretation.      Medications / Allergies: per chart  Antibiotics: Anti-infectives   Start     Dose/Rate Route Frequency Ordered Stop   06/15/14 2000  cefoTEtan (CEFOTAN) 2 g in dextrose 5 % 50 mL IVPB     2 g 100 mL/hr over 30 Minutes Intravenous Every 12 hours 06/15/14 1352  06/15/14 2019   06/15/14 1049  clindamycin (CLEOCIN) 900 mg, gentamicin (GARAMYCIN) 240 mg in sodium chloride 0.9 % 1,000 mL for intraperitoneal lavage  Status:  Discontinued       As needed 06/15/14 1049 06/15/14 1157   06/15/14 0612  cefoTEtan (CEFOTAN) 2 g in dextrose 5 % 50 mL IVPB     2 g 100 mL/hr over 30 Minutes Intravenous On call to O.R. 06/15/14 2820 06/15/14 0736   06/15/14 0600  clindamycin (CLEOCIN) 900 mg, gentamicin (GARAMYCIN) 240 mg in sodium chloride 0.9 % 1,000 mL for intraperitoneal lavage  Status:  Discontinued    Comments:  Pharmacy may adjust dosing strength, schedule, rate of infusion, etc as needed to optimize therapy    Intraperitoneal  Once 06/14/14 1435 06/15/14 1335       Note: Portions of this report may have been transcribed using voice recognition software. Every effort was made to ensure accuracy; however, inadvertent computerized transcription errors may be present.   Any transcriptional errors that result from this process are unintentional.

## 2014-06-16 NOTE — Care Management Note (Signed)
    Page 1 of 1   06/16/2014     3:06:52 PM CARE MANAGEMENT NOTE 06/16/2014  Patient:  Douglas Ballard, Douglas Ballard   Account Number:  [id number]  Date Initiated:  06/16/2014  Documentation initiated by:  Douglas Ballard  Subjective/Objective Assessment:   63 Y/O M ADMITTED W/COLVESICAL FISTULA.     Action/Plan:   FROM HOME W/SPOUSE.   Anticipated DC Date:  06/21/2014   Anticipated DC Plan:  [name]  CM consult      Choice offered to / List presented to:             Status of service:  In process, will continue to follow Medicare Important Message given?   (If response is "NO", the following Medicare IM given date fields will be blank) Date Medicare IM given:   Medicare IM given by:   Date Additional Medicare IM given:   Additional Medicare IM given by:    Discharge Disposition:    Per UR Regulation:  Reviewed for med. necessity/level of care/duration of stay  If discussed at [place] of Stay Meetings, dates discussed:    Comments:  06/16/14 Douglas Deloney RN,BSN NCM [phone] POD#1 [place].

## 2014-06-17 ENCOUNTER — Encounter: Payer: Self-pay | Admitting: Internal Medicine

## 2014-06-17 LAB — GLUCOSE, CAPILLARY
GLUCOSE-CAPILLARY: 118 mg/dL — AB (ref 70–99)
Glucose-Capillary: 152 mg/dL — ABNORMAL HIGH (ref 70–99)
Glucose-Capillary: 143 mg/dL — ABNORMAL HIGH (ref 70–99)
Glucose-Capillary: 151 mg/dL — ABNORMAL HIGH (ref 70–99)

## 2014-06-17 MED ORDER — LACTATED RINGERS IV SOLN
INTRAVENOUS | Status: DC
  Administered 2014-06-18 (×2): via INTRAVENOUS
  Administered 2014-06-19: 1000 mL via INTRAVENOUS
  Administered 2014-06-19 – 2014-06-20 (×2): via INTRAVENOUS

## 2014-06-17 MED ORDER — LACTATED RINGERS IV BOLUS (SEPSIS)
1000.0000 mL | Freq: Three times a day (TID) | INTRAVENOUS | Status: AC | PRN
  Administered 2014-06-17: 1000 mL via INTRAVENOUS

## 2014-06-17 MED ORDER — WITCH HAZEL-GLYCERIN EX PADS
1.0000 "application " | MEDICATED_PAD | CUTANEOUS | Status: DC | PRN
  Filled 2014-06-17: qty 100

## 2014-06-17 MED ORDER — BISMUTH SUBSALICYLATE 262 MG/15ML PO SUSP
30.0000 mL | Freq: Three times a day (TID) | ORAL | Status: DC | PRN
  Administered 2014-06-17 – 2014-06-18 (×2): 30 mL via ORAL
  Filled 2014-06-17: qty 236

## 2014-06-17 MED ORDER — ISOSORB DINITRATE-HYDRALAZINE 20-37.5 MG PO TABS
1.0000 | ORAL_TABLET | Freq: Two times a day (BID) | ORAL | Status: DC
  Administered 2014-06-17 – 2014-06-20 (×7): 1 via ORAL
  Filled 2014-06-17 (×7): qty 1

## 2014-06-17 MED ORDER — HYDROCORTISONE ACE-PRAMOXINE 2.5-1 % RE CREA
1.0000 "application " | TOPICAL_CREAM | Freq: Four times a day (QID) | RECTAL | Status: DC | PRN
  Filled 2014-06-17: qty 30

## 2014-06-17 NOTE — Progress Notes (Signed)
[facility]  [address] [place], [place] [address] Phone: [phone] FAX: [phone]    [name] [id number] [birth year]  CARE TEAM:  PCP: Stephens Shire, MD  Outpatient Care Team: Patient Care Team: Stephens Shire, MD as PCP - General (Family Medicine) Bernestine Amass, MD as Consulting Physician (Urology) Michael Boston, MD as Consulting Physician (General Surgery) Johnn Hai, MD as Consulting Physician (Orthopedic Surgery)  Inpatient Treatment Team: Treatment Team: Attending Provider: Michael Boston, MD; Registered Nurse: Loyal Gambler, RN; Technician: Darlyn Chamber, NT; Registered Nurse: Midge Minium, RN; Registered Nurse: Jacquelin Hawking, RN; Technician: Caroline Sauger, NT; Registered Nurse: Meredith [place], RN   Subjective:  Had nausea and spit up yesterday.  Now having flatus and feeling better.  No bowel movements yet.  Walking in hallways.  Objective:  Vital signs:  Filed Vitals:   06/16/14 1619 06/16/14 1734 06/16/14 2144 06/17/14 0642  BP: 189/91 164/74 161/76 157/72  Pulse: 92 86 87 80  Temp:   99.1 F (37.3 C) 98.2 F (36.8 C)  TempSrc:   Oral Oral  Resp:   18 20  Height:      Weight:      SpO2:   95% 97%    Last BM Date: 06/16/14  Intake/Output   Yesterday:  10/28 0701 - 10/29 0700 In: 2937.5 [P.O.:600; I.V.:2337.5] Out: 1275 [Urine:1275] This shift:  Total I/O In: 2457.5 [P.O.:120; I.V.:2337.5] Out: 675 [Urine:675]  Bowel function:  Flatus: YES  BM: small  Drain: n/a  Physical Exam:  General: Pt awake/alert/oriented x4 in no acute distress Eyes: PERRL, normal EOM.  Sclera clear.  No icterus Neuro: CN II-XII intact w/o focal sensory/motor deficits. Lymph: No head/neck/groin lymphadenopathy Psych:  No delerium/psychosis/paranoia HENT: Normocephalic, Mucus membranes moist.  No thrush Neck: Supple, No tracheal deviation Chest: No chest wall  pain w good excursion CV:  Pulses intact.  Regular rhythm MS: Normal AROM mjr joints.  No obvious deformity Abdomen: Soft.  Mildly distended.  Mildly tender at incisions only.  Old blood in incision.  No evidence of peritonitis.  No incarcerated hernias. Ext:  SCDs BLE.  No mjr edema.  No cyanosis Skin: No petechiae / purpura   Problem List:   Principal Problem:   Colovesical fistula s/p sigmoid colectomy Active Problems:   HTN (hypertension)   Alcohol abuse   Tobacco abuse   Diverticulosis of colon without hemorrhage   Diabetes mellitus type 2 in nonobese   Assessment  Douglas Ballard  63 y.o. male  2 Days Post-Op  Procedure(s): POST-OPERATIVE DIAGNOSIS: colovesical fistula from diverticulitis  PROCEDURE: Procedure(s):  MINMALLY INVASIVE(ROBOTIC/LAPAROSCOPIC)SIGMONID COLECTOMY  ROBOTIC TAKEDOWN COLOVESICULAR FISTULA  OMENTOPEXY  RIGID PROCTOSCOPY  SURGEON: Surgeon(s):  Michael Boston, MD  Leighton Ruff, MD - Assist   Recovering  Plan:  -liquids.  Adv to fulls only as tolerated -f/u pathology - most likely diverticulitis etiology -HTN - increase home meds w PRN backup -VTE prophylaxis- SCDs, heparin.  Switch back to Xerelto when tol solids PO & flatus & >48hrs out from surgery -DM control - glucose 130-140's.  SSI & restart glyburide -mobilize as tolerated to help recovery  I updated the patient's status to the patient & his RN.  Recommendations were made.  Questions were answered.  The patient expressed understanding & appreciation.    [name], M.D., F.A.C.S. Gastrointestinal and Minimally Invasive Surgery [facility] [address] [place], [place] [address] [phone] Main / Paging   06/17/2014   Results:   Labs: Results for orders placed during the hospital encounter of 06/15/14 (from the past 48 hour(s))  GLUCOSE, CAPILLARY     Status: Abnormal   Collection Time    06/15/14  5:25 PM      Result Value  Ref Range   Glucose-Capillary 150 (*) 70 - 99 mg/dL  GLUCOSE, CAPILLARY     Status: Abnormal   Collection Time    06/15/14  9:06 PM      Result Value Ref Range   Glucose-Capillary 194 (*) 70 - 99 mg/dL   Comment 1 Documented in Chart     Comment 2 Notify RN    BASIC METABOLIC PANEL     Status: Abnormal   Collection Time    06/16/14  3:35 AM      Result Value Ref Range   Sodium 138  137 - 147 mEq/L   Potassium 4.3  3.7 - 5.3 mEq/L   Chloride 104  96 - 112 mEq/L   CO2 22  19 - 32 mEq/L   Glucose, Bld 145 (*) 70 - 99 mg/dL   BUN 18  6 - 23 mg/dL   Creatinine, Ser 1.41 (*) 0.50 - 1.35 mg/dL   Calcium 8.4  8.4 - 10.5 mg/dL   GFR calc non Af Amer 52 (*) >90 mL/min   GFR calc Af Amer 60 (*) >90 mL/min   Comment: (NOTE)     The eGFR has been calculated using the CKD EPI equation.     This calculation has not been validated in all clinical situations.     eGFR's persistently <90 mL/min signify possible Chronic Kidney     Disease.   Anion gap 12  5 - 15  CBC     Status: Abnormal   Collection Time    06/16/14  3:35 AM      Result Value Ref Range   WBC 12.1 (*) 4.0 - 10.5 K/uL   RBC 2.90 (*) 4.22 - 5.81 MIL/uL   Hemoglobin 9.5 (*) 13.0 - 17.0 g/dL   HCT 27.2 (*) 39.0 - 52.0 %   MCV 93.8  78.0 - 100.0 fL   MCH 32.8  26.0 - 34.0 pg   MCHC 34.9  30.0 - 36.0 g/dL   RDW 12.2  11.5 - 15.5 %   Platelets 246  150 - 400 K/uL  MAGNESIUM     Status: None   Collection Time    06/16/14  3:35 AM      Result Value Ref Range   Magnesium 1.6  1.5 - 2.5 mg/dL  GLUCOSE, CAPILLARY     Status: Abnormal   Collection Time    06/16/14  7:41 AM      Result Value Ref Range   Glucose-Capillary 173 (*) 70 - 99 mg/dL  GLUCOSE, CAPILLARY     Status: Abnormal   Collection Time    06/16/14 11:51 AM      Result Value Ref Range   Glucose-Capillary 156 (*) 70 - 99 mg/dL  GLUCOSE, CAPILLARY     Status: Abnormal   Collection Time    06/16/14  4:37 PM      Result Value Ref Range   Glucose-Capillary 154  (*) 70 - 99 mg/dL  GLUCOSE, CAPILLARY     Status: Abnormal   Collection Time    06/16/14  9:40 PM      Result Value Ref Range   Glucose-Capillary 156 (*) 70 -  99 mg/dL   Comment 1 Notify RN      Imaging / Studies: Dg C-arm 1-60 Min-no Report  06/15/2014   CLINICAL DATA: ureteral catheters   C-ARM 1-60 MINUTES  Fluoroscopy was utilized by the requesting physician.  No radiographic  interpretation.     Medications / Allergies: per chart  Antibiotics: Anti-infectives   Start     Dose/Rate Route Frequency Ordered Stop   06/15/14 2000  cefoTEtan (CEFOTAN) 2 g in dextrose 5 % 50 mL IVPB     2 g 100 mL/hr over 30 Minutes Intravenous Every 12 hours 06/15/14 1352 06/15/14 2019   06/15/14 1049  clindamycin (CLEOCIN) 900 mg, gentamicin (GARAMYCIN) 240 mg in sodium chloride 0.9 % 1,000 mL for intraperitoneal lavage  Status:  Discontinued       As needed 06/15/14 1049 06/15/14 1157   06/15/14 0612  cefoTEtan (CEFOTAN) 2 g in dextrose 5 % 50 mL IVPB     2 g 100 mL/hr over 30 Minutes Intravenous On call to O.R. 06/15/14 4373 06/15/14 0736   06/15/14 0600  clindamycin (CLEOCIN) 900 mg, gentamicin (GARAMYCIN) 240 mg in sodium chloride 0.9 % 1,000 mL for intraperitoneal lavage  Status:  Discontinued    Comments:  Pharmacy may adjust dosing strength, schedule, rate of infusion, etc as needed to optimize therapy    Intraperitoneal  Once 06/14/14 1435 06/15/14 1335       Note: Portions of this report may have been transcribed using voice recognition software. Every effort was made to ensure accuracy; however, inadvertent computerized transcription errors may be present.   Any transcriptional errors that result from this process are unintentional.

## 2014-06-17 NOTE — Progress Notes (Signed)
Patient had another episode of emesis after taking a few bites of his full liquid breakfast. Zofran given and will change his diet back to clears.  Patient is resting now.

## 2014-06-17 NOTE — Progress Notes (Signed)
Quick Note:  Diminutive adenoma Repeat colon 5 years ______

## 2014-06-17 NOTE — Progress Notes (Signed)
Notified Dr. Johney Maine that patient is complaining of frequent diarrhea. Patient states he gone 4 times in the last hour and probably 20 times for the day.  New orders received to give 1 L of LR, use Pepto Bismol prn, and to change diet to sips only.  Will continue to monitor patient.

## 2014-06-17 NOTE — Progress Notes (Signed)
Pharmacy Brief Note - Alvimopan (Entereg)   The standing order set for alvimopan (Entereg) now includes an automatic order to discontinue the drug after the patient has had a bowel movement. The change was approved by the [place] and the Medical Executive Committee.   This patient has had bowel movements documented by nursing. Therefore, alvimopan has been discontinued. If there are questions, please contact the pharmacy at [phone].   Thank you- Doreene Eland, PharmD, BCPS.   Pager: [phone]

## 2014-06-18 LAB — BASIC METABOLIC PANEL
Anion gap: 12 (ref 5–15)
BUN: 20 mg/dL (ref 6–23)
CHLORIDE: 103 meq/L (ref 96–112)
CO2: 21 meq/L (ref 19–32)
Calcium: 8.2 mg/dL — ABNORMAL LOW (ref 8.4–10.5)
Creatinine, Ser: 1.26 mg/dL (ref 0.50–1.35)
GFR calc non Af Amer: 59 mL/min — ABNORMAL LOW (ref >90)
GFR, EST AFRICAN AMERICAN: 69 mL/min — AB (ref >90)
Glucose, Bld: 104 mg/dL — ABNORMAL HIGH (ref 70–99)
POTASSIUM: 3.8 meq/L (ref 3.7–5.3)
Sodium: 136 mEq/L — ABNORMAL LOW (ref 137–147)

## 2014-06-18 LAB — CBC
HEMATOCRIT: 22.1 % — AB (ref 39.0–52.0)
HEMOGLOBIN: 7.8 g/dL — AB (ref 13.0–17.0)
MCH: 33.2 pg (ref 26.0–34.0)
MCHC: 35.3 g/dL (ref 30.0–36.0)
MCV: 94 fL (ref 78.0–100.0)
Platelets: 225 10*3/uL (ref 150–400)
RBC: 2.35 MIL/uL — AB (ref 4.22–5.81)
RDW: 12.5 % (ref 11.5–15.5)
WBC: 12 10*3/uL — AB (ref 4.0–10.5)

## 2014-06-18 LAB — GLUCOSE, CAPILLARY
GLUCOSE-CAPILLARY: 146 mg/dL — AB (ref 70–99)
Glucose-Capillary: 102 mg/dL — ABNORMAL HIGH (ref 70–99)
Glucose-Capillary: 164 mg/dL — ABNORMAL HIGH (ref 70–99)

## 2014-06-18 MED ORDER — SODIUM CHLORIDE 0.9 % IJ SOLN
3.0000 mL | Freq: Two times a day (BID) | INTRAMUSCULAR | Status: DC
Start: 2014-06-18 — End: 2014-06-20
  Administered 2014-06-19: 3 mL via INTRAVENOUS

## 2014-06-18 MED ORDER — SODIUM CHLORIDE 0.9 % IJ SOLN: 3.0000 mL | INTRAMUSCULAR | Status: DC | PRN

## 2014-06-18 MED ORDER — GLUCERNA SHAKE PO LIQD
237.0000 mL | Freq: Two times a day (BID) | ORAL | Status: DC
  Administered 2014-06-18 – 2014-06-19 (×2): 237 mL via ORAL
  Filled 2014-06-18 (×5): qty 237

## 2014-06-18 MED ORDER — SODIUM CHLORIDE 0.9 % IV SOLN: 250.0000 mL | INTRAVENOUS | Status: DC | PRN

## 2014-06-18 NOTE — Progress Notes (Signed)
[facility]  [address] [place], [place] [address] Phone: [phone] FAX: [phone]    Douglas Ballard [id number] 1951-02-08  CARE TEAM:  PCP: Stephens Shire, MD  Outpatient Care Team: Patient Care Team: Stephens Shire, MD as PCP - General (Family Medicine) Bernestine Amass, MD as Consulting Physician (Urology) Michael Boston, MD as Consulting Physician (General Surgery) Johnn Hai, MD as Consulting Physician (Orthopedic Surgery) Gatha Mayer, MD as Consulting Physician (Gastroenterology)  Inpatient Treatment Team: Treatment Team: Attending Provider: Michael Boston, MD; Technician: Darlyn Chamber, NT; Registered Nurse: Midge Minium, RN; Registered Nurse: Jacquelin Hawking, RN; Technician: Caroline Sauger, NT; Registered Nurse: Kennieth Rad, RN; Registered Nurse: Andre Lefort, RN; Technician: Naomie Dean, NT   Subjective:  Patient with fecal urgency and frequent small stools.  Treated with Pepto-Bismol.  Seems to have slowed down.  Feels good  Walking   Hungry  Objective:  Vital signs:  Filed Vitals:   06/17/14 1302 06/17/14 1400 06/17/14 2120 06/18/14 0454  BP: 129/60 140/67 151/71 135/61  Pulse: 70 70 76 64  Temp: 97.9 F (36.6 C)  97.9 F (36.6 C) 98.1 F (36.7 C)  TempSrc: Oral  Oral Oral  Resp: _0 Height:      Weight:    173 lb 1.6 oz (78.518 kg)  SpO2: 97%  100% 97%    Last BM Date: 06/17/14  Intake/Output   Yesterday:  10/29 0701 - 10/30 0700 In: 963.8 [P.O.:270; I.V.:693.8] Out: 1 [Emesis/NG output:1] This shift:     Bowel function:  Flatus: YES  BM: Many yesterday.  X1 this AM  Drain: n/a  Physical Exam:  General: Pt awake/alert/oriented x4 in no acute distress Eyes: PERRL, normal EOM.  Sclera clear.  No icterus Neuro: CN II-XII intact w/o focal sensory/motor deficits. Lymph: No head/neck/groin lymphadenopathy Psych:  No  delerium/psychosis/paranoia HENT: Normocephalic, Mucus membranes moist.  No thrush Neck: Supple, No tracheal deviation Chest: No chest wall pain w good excursion CV:  Pulses intact.  Regular rhythm MS: Normal AROM mjr joints.  No obvious deformity Abdomen: Soft.  Mildly distended.  Minimally tender at incisions only.  Old blood in incision.  No evidence of peritonitis.  No incarcerated hernias.  OnQ collapsed.  I removed all dressings - incisions clean Ext:  SCDs BLE.  No mjr edema.  No cyanosis Skin: No petechiae / purpura   Problem List:   Principal Problem:   Colovesical fistula s/p sigmoid colectomy Active Problems:   HTN (hypertension)   Alcohol abuse   Tobacco abuse   Diabetes mellitus type 2 in nonobese   Assessment  Douglas Ballard  63 y.o. male  3 Days Post-Op  Procedure(s): POST-OPERATIVE DIAGNOSIS: colovesical fistula from diverticulitis  PROCEDURE: Procedure(s):  MINMALLY INVASIVE(ROBOTIC/LAPAROSCOPIC)SIGMONID COLECTOMY  ROBOTIC TAKEDOWN COLOVESICULAR FISTULA  OMENTOPEXY  RIGID PROCTOSCOPY  SURGEON: Surgeon(s):  Michael Boston, MD  Leighton Ruff, MD - Assist   Recovering  Plan:  -try solids -pathology c/w diverticulitis - d/w pt & his wifem  I updated the status of the patient to the patient & his wife.  I made recommendations.  I answered questions.  Post-op inpatient & outpatient plans discussed.  Understanding & appreciation was expressed.  -HTN - increase home meds w PRN backup -VTE prophylaxis- SCDs only with lower Hgb.  Switch back to [facility] when tol solids PO, Hgb stable x 48hrs & flatus -DM control - SSI & glyburide -mobilize as  tolerated to help recovery     Douglas Ballard, M.D., F.A.C.S. Gastrointestinal and Minimally Invasive Surgery [facility] [address], [place], [place] [address] [phone] Main / Paging   06/18/2014   Results:   Labs: Results for orders placed during the hospital  encounter of 06/15/14 (from the past 48 hour(s))  GLUCOSE, CAPILLARY     Status: Abnormal   Collection Time    06/16/14 11:51 AM      Result Value Ref Range   Glucose-Capillary 156 (*) 70 - 99 mg/dL  GLUCOSE, CAPILLARY     Status: Abnormal   Collection Time    06/16/14  4:37 PM      Result Value Ref Range   Glucose-Capillary 154 (*) 70 - 99 mg/dL  GLUCOSE, CAPILLARY     Status: Abnormal   Collection Time    06/16/14  9:40 PM      Result Value Ref Range   Glucose-Capillary 156 (*) 70 - 99 mg/dL   Comment 1 Notify RN    GLUCOSE, CAPILLARY     Status: Abnormal   Collection Time    06/17/14  7:24 AM      Result Value Ref Range   Glucose-Capillary 152 (*) 70 - 99 mg/dL  GLUCOSE, CAPILLARY     Status: Abnormal   Collection Time    06/17/14 11:41 AM      Result Value Ref Range   Glucose-Capillary 143 (*) 70 - 99 mg/dL  GLUCOSE, CAPILLARY     Status: Abnormal   Collection Time    06/17/14  4:43 PM      Result Value Ref Range   Glucose-Capillary 118 (*) 70 - 99 mg/dL  GLUCOSE, CAPILLARY     Status: Abnormal   Collection Time    06/17/14  9:14 PM      Result Value Ref Range   Glucose-Capillary 151 (*) 70 - 99 mg/dL  CBC     Status: Abnormal   Collection Time    06/18/14  4:35 AM      Result Value Ref Range   WBC 12.0 (*) 4.0 - 10.5 K/uL   RBC 2.35 (*) 4.22 - 5.81 MIL/uL   Hemoglobin 7.8 (*) 13.0 - 17.0 g/dL   HCT 22.1 (*) 39.0 - 52.0 %   MCV 94.0  78.0 - 100.0 fL   MCH 33.2  26.0 - 34.0 pg   MCHC 35.3  30.0 - 36.0 g/dL   RDW 12.5  11.5 - 15.5 %   Platelets 225  150 - 400 K/uL  BASIC METABOLIC PANEL     Status: Abnormal   Collection Time    06/18/14  4:35 AM      Result Value Ref Range   Sodium 136 (*) 137 - 147 mEq/L   Potassium 3.8  3.7 - 5.3 mEq/L   Chloride 103  96 - 112 mEq/L   CO2 21  19 - 32 mEq/L   Glucose, Bld 104 (*) 70 - 99 mg/dL   BUN 20  6 - 23 mg/dL   Creatinine, Ser 1.26  0.50 - 1.35 mg/dL   Calcium 8.2 (*) 8.4 - 10.5 mg/dL   GFR calc non Af Amer 59  (*) >90 mL/min   GFR calc Af Amer 69 (*) >90 mL/min   Comment: (NOTE)     The eGFR has been calculated using the CKD EPI equation.     This calculation has not been validated in all clinical situations.  eGFR's persistently <90 mL/min signify possible Chronic Kidney     Disease.   Anion gap 12  5 - 15  GLUCOSE, CAPILLARY     Status: Abnormal   Collection Time    06/18/14  7:48 AM      Result Value Ref Range   Glucose-Capillary 102 (*) 70 - 99 mg/dL    Imaging / Studies: No results found.  Medications / Allergies: per chart  Antibiotics: Anti-infectives   Start     Dose/Rate Route Frequency Ordered Stop   06/15/14 2000  cefoTEtan (CEFOTAN) 2 g in dextrose 5 % 50 mL IVPB     2 g 100 mL/hr over 30 Minutes Intravenous Every 12 hours 06/15/14 1352 06/15/14 2019   06/15/14 1049  clindamycin (CLEOCIN) 900 mg, gentamicin (GARAMYCIN) 240 mg in sodium chloride 0.9 % 1,000 mL for intraperitoneal lavage  Status:  Discontinued       As needed 06/15/14 1049 06/15/14 1157   06/15/14 0612  cefoTEtan (CEFOTAN) 2 g in dextrose 5 % 50 mL IVPB     2 g 100 mL/hr over 30 Minutes Intravenous On call to O.R. 06/15/14 2841 06/15/14 0736   06/15/14 0600  clindamycin (CLEOCIN) 900 mg, gentamicin (GARAMYCIN) 240 mg in sodium chloride 0.9 % 1,000 mL for intraperitoneal lavage  Status:  Discontinued    Comments:  Pharmacy may adjust dosing strength, schedule, rate of infusion, etc as needed to optimize therapy    Intraperitoneal  Once 06/14/14 1435 06/15/14 1335       Note: Portions of this report may have been transcribed using voice recognition software. Every effort was made to ensure accuracy; however, inadvertent computerized transcription errors may be present.   Any transcriptional errors that result from this process are unintentional.

## 2014-06-19 LAB — GLUCOSE, CAPILLARY
GLUCOSE-CAPILLARY: 127 mg/dL — AB (ref 70–99)
Glucose-Capillary: 130 mg/dL — ABNORMAL HIGH (ref 70–99)
Glucose-Capillary: 145 mg/dL — ABNORMAL HIGH (ref 70–99)
Glucose-Capillary: 121 mg/dL — ABNORMAL HIGH (ref 70–99)
Glucose-Capillary: 121 mg/dL — ABNORMAL HIGH (ref 70–99)

## 2014-06-19 LAB — CBC
HEMATOCRIT: 23.1 % — AB (ref 39.0–52.0)
Hemoglobin: 8.1 g/dL — ABNORMAL LOW (ref 13.0–17.0)
MCH: 33.1 pg (ref 26.0–34.0)
MCHC: 35.1 g/dL (ref 30.0–36.0)
MCV: 94.3 fL (ref 78.0–100.0)
PLATELETS: 244 10*3/uL (ref 150–400)
RBC: 2.45 MIL/uL — ABNORMAL LOW (ref 4.22–5.81)
RDW: 12.4 % (ref 11.5–15.5)
WBC: 10.4 10*3/uL (ref 4.0–10.5)

## 2014-06-19 LAB — POTASSIUM: POTASSIUM: 3.6 meq/L — AB (ref 3.7–5.3)

## 2014-06-19 LAB — CREATININE, SERUM
CREATININE: 1.01 mg/dL (ref 0.50–1.35)
GFR calc non Af Amer: 78 mL/min — ABNORMAL LOW (ref >90)

## 2014-06-19 NOTE — Progress Notes (Signed)
General Surgery Note  LOS: 4 days  POD -  4 Days Post-Op PCP - Dr. Elease Hashimoto [facility] - [facility] [place]  Assessment/Plan: 1.  MINMALLY INVASIVE(ROBOTIC/LAPAROSCOPIC)SIGMONID COLECTOMY AND takedown COLOVESICULAR FISTULA, omentopexy.  RIGID PROCTOSCOPY CYSTOSCOPY WITH BILATERAL STENT PLACEMENT - 06/15/2014 - [facility]/[place]  For colovesical fistula  Looks good.  Tolerating reg diet  2.  DM 3.  Hgb - 8.1 - 1031/2015  Will recheck labs in AM.  I have held [place] for at least one more day.  4.  HTN (hypertension)  5.  Alcohol abuse  6.  Tobacco abuse  7.  History of TIA x 3 in 04/2014 (MRI was negative for CVA) - expressive aphasia  On Xarelto for this 8.  History of A.Fib 9.  DVT prophylaxis - on SCD's - chemoprophylaxis on hold for low hgb   Principal Problem:   Colovesical fistula s/p sigmoid colectomy Active Problems:   HTN (hypertension)   Alcohol abuse   Tobacco abuse   Diabetes mellitus type 2 in nonobese  Subjective:  Doing well.  Tolerating diet.  His brother is in the room with him. Objective:   Filed Vitals:   06/19/14 0440  BP: 123/61  Pulse: 76  Temp: 98.1 F (36.7 C)  Resp: 18     Intake/Output from previous day:  10/30 0701 - 10/31 0700 In: 600 [P.O.:600] Out: -   Intake/Output this shift:      Physical Exam:   General: WN WM who is alert and oriented.    HEENT: Normal. Pupils equal. .   Lungs: Clear   Abdomen: Soft   Wound: Clean   Lab Results:    Recent Labs  06/18/14 0435 06/19/14 0521  WBC 12.0* 10.4  HGB 7.8* 8.1*  HCT 22.1* 23.1*  PLT 225 244    BMET   Recent Labs  06/18/14 0435 06/19/14 0521  NA 136*  --   K 3.8 3.6*  CL 103  --   CO2 21  --   GLUCOSE 104*  --   BUN 20  --   CREATININE 1.26 1.01  CALCIUM 8.2*  --     PT/INR  No results found for this basename: LABPROT, INR,  in the last 72 hours  ABG  No results found for this basename: PHART, PCO2, PO2, HCO3,  in the last 72 hours   Studies/Results:  No results  found.   Anti-infectives:   Anti-infectives   Start     Dose/Rate Route Frequency Ordered Stop   06/15/14 2000  cefoTEtan (CEFOTAN) 2 g in dextrose 5 % 50 mL IVPB     2 g 100 mL/hr over 30 Minutes Intravenous Every 12 hours 06/15/14 1352 06/15/14 2019   06/15/14 1049  clindamycin (CLEOCIN) 900 mg, gentamicin (GARAMYCIN) 240 mg in sodium chloride 0.9 % 1,000 mL for intraperitoneal lavage  Status:  Discontinued       As needed 06/15/14 1049 06/15/14 1157   06/15/14 0612  cefoTEtan (CEFOTAN) 2 g in dextrose 5 % 50 mL IVPB     2 g 100 mL/hr over 30 Minutes Intravenous On call to O.R. 06/15/14 [id number] 06/15/14 0736   06/15/14 0600  clindamycin (CLEOCIN) 900 mg, gentamicin (GARAMYCIN) 240 mg in sodium chloride 0.9 % 1,000 mL for intraperitoneal lavage  Status:  Discontinued    Comments:  Pharmacy may adjust dosing strength, schedule, rate of infusion, etc as needed to optimize therapy    Intraperitoneal  Once 06/14/14 1435 06/15/14 1335  Alphonsa Overall, MD, FACS Pager: [phone] Surgery Office: [phone] 06/19/2014

## 2014-06-20 LAB — CBC
HCT: 22.7 % — ABNORMAL LOW (ref 39.0–52.0)
Hemoglobin: 8.4 g/dL — ABNORMAL LOW (ref 13.0–17.0)
MCH: 34.6 pg — ABNORMAL HIGH (ref 26.0–34.0)
MCHC: 37 g/dL — ABNORMAL HIGH (ref 30.0–36.0)
MCV: 93.4 fL (ref 78.0–100.0)
PLATELETS: 262 10*3/uL (ref 150–400)
RBC: 2.43 MIL/uL — ABNORMAL LOW (ref 4.22–5.81)
RDW: 12.2 % (ref 11.5–15.5)
WBC: 8.1 10*3/uL (ref 4.0–10.5)

## 2014-06-20 LAB — CREATININE, SERUM
CREATININE: 0.92 mg/dL (ref 0.50–1.35)
GFR calc Af Amer: >90 mL/min (ref >90)
GFR, EST NON AFRICAN AMERICAN: 89 mL/min — AB (ref >90)

## 2014-06-20 LAB — POTASSIUM: Potassium: 3.5 mEq/L — ABNORMAL LOW (ref 3.7–5.3)

## 2014-06-20 MED ORDER — OXYCODONE-ACETAMINOPHEN 5-325 MG PO TABS: 1.0000 | ORAL_TABLET | ORAL | Status: DC | PRN

## 2014-06-20 NOTE — Progress Notes (Signed)
Patient ID: Douglas Ballard, male   DOB: 09/25/1950, 63 y.o.   MRN: [id number]  [facility] - Progress Note  POD# 5   Subjective: Patient up in bed.  Comfortable.  Tolerating diet.  Anxious to go home today.  Objective: Vital signs in last 24 hours: Temp:  [98 F (36.7 C)-98.4 F (36.9 C)] 98.4 F (36.9 C) (11/01 0535) Pulse Rate:  [66-79] 66 (11/01 0535) Resp:  [16-18] 18 (11/01 0535) BP: (155-186)/(71-100) 164/82 mmHg (11/01 0535) SpO2:  [98 %-99 %] 99 % (11/01 0535) Weight:  [178 lb 3.2 oz (80.831 kg)] 178 lb 3.2 oz (80.831 kg) (11/01 0622) Last BM Date: 06/20/14  Intake/Output from previous day:    Exam: HEENT - clear, not icteric Neck - soft Chest - clear bilaterally Cor - RRR, no murmur Abd - soft, mild distension; BS present; wounds dry and intact Ext - no significant edema Neuro - grossly intact, no focal deficits  Lab Results:   Recent Labs  06/19/14 0521 06/20/14 0403  WBC 10.4 8.1  HGB 8.1* 8.4*  HCT 23.1* 22.7*  PLT 244 262     Recent Labs  06/18/14 0435 06/19/14 0521 06/20/14 0403  NA 136*  --   --   K 3.8 3.6* 3.5*  CL 103  --   --   CO2 21  --   --   GLUCOSE 104*  --   --   BUN 20  --   --   CREATININE 1.26 1.01 0.92  CALCIUM 8.2*  --   --     Studies/Results: No results found.  Assessment / Plan: 1.  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 Johney Maine at [place] to be arranged  Earnstine Regal, MD, [facility] Office: [phone]  06/20/2014

## 2014-06-20 NOTE — Plan of Care (Signed)
Problem: Phase III Progression Outcomes Goal: Pain controlled on oral analgesia Outcome: Completed/Met Date Met:  06/20/14 Goal: Activity at appropriate level-compared to baseline (UP IN CHAIR FOR HEMODIALYSIS)  Outcome: Completed/Met Date Met:  06/20/14 Goal: Voiding independently Outcome: Completed/Met Date Met:  06/20/14 Goal: IV changed to normal saline lock Outcome: Not Applicable Date Met:  41/42/39 Goal: Nasogastric tube discontinued Outcome: Not Applicable Date Met:  53/20/23 Goal: Discharge plan remains appropriate-arrangements made Outcome: Completed/Met Date Met:  06/20/14 Goal: Demonstrates TCDB, IS independently Outcome: Completed/Met Date Met:  06/20/14 Goal: Other Phase III Outcomes/Goals Outcome: Completed/Met Date Met:  06/20/14

## 2014-06-20 NOTE — Plan of Care (Signed)
Problem: Discharge Progression Outcomes Goal: Barriers To Progression Addressed/Resolved Outcome: Completed/Met Date Met:  06/20/14 Goal: Discharge plan in place and appropriate Outcome: Completed/Met Date Met:  06/20/14 Goal: Pain controlled with appropriate interventions Outcome: Completed/Met Date Met:  06/20/14

## 2014-06-20 NOTE — Plan of Care (Signed)
Problem: Discharge Progression Outcomes Goal: Hemodynamically stable Outcome: Completed/Met Date Met:  44/73/95 Goal: Complications resolved/controlled Outcome: Completed/Met Date Met:  06/20/14 Goal: Tolerating diet Outcome: Completed/Met Date Met:  06/20/14 Goal: Activity appropriate for discharge plan Outcome: Completed/Met Date Met:  06/20/14 Goal: Tubes and drains discontinued if indicated Outcome: Not Applicable Date Met:  84/41/71 Goal: Staples/sutures removed Outcome: Not Applicable Date Met:  27/87/18 Goal: Steri-Strips applied Outcome: Not Applicable Date Met:  36/72/55

## 2014-06-21 LAB — GLUCOSE, CAPILLARY: Glucose-Capillary: 219 mg/dL — ABNORMAL HIGH (ref 70–99)

## 2014-06-21 NOTE — Discharge Summary (Signed)
Physician Discharge Summary  Patient ID: Douglas Ballard MRN: [id number] DOB/AGE: April 04, 1951 63 y.o.  Admit date: 06/15/2014 Discharge date: 06/20/2014  Patient Care Team: Stephens Shire, MD as PCP - General (Family Medicine) Bernestine Amass, MD as Consulting Physician (Urology) Michael Boston, MD as Consulting Physician (General Surgery) Johnn Hai, MD as Consulting Physician (Orthopedic Surgery) Gatha Mayer, MD as Consulting Physician (Gastroenterology)  Admission Diagnoses: Principal Problem:   Colovesical fistula s/p sigmoid colectomy Active Problems:   HTN (hypertension)   Alcohol abuse   Tobacco abuse   Diabetes mellitus type 2 in nonobese   Discharge Diagnoses:  Principal Problem:   Colovesical fistula s/p sigmoid colectomy Active Problems:   HTN (hypertension)   Alcohol abuse   Tobacco abuse   Diabetes mellitus type 2 in nonobese   POST-OPERATIVE DIAGNOSIS: colovesical fistula from diverticulitis  PROCEDURE: Procedure(s):  MINMALLY INVASIVE(ROBOTIC/LAPAROSCOPIC)SIGMONID COLECTOMY  ROBOTIC TAKEDOWN COLOVESICULAR FISTULA  OMENTOPEXY  RIGID PROCTOSCOPY   SURGEON: Surgeon(s):  Michael Boston, MD  Leighton Ruff, MD - Assist  Preoperative diagnosis:colovesical fistula Postoperative diagnosis:same  Procedure:cystoscopy with insertion of bilateral ureteral catheters  Surgeon: Bernestine Amass M.D.  Anesthesia: Gen.   Consults: urology  Hospital Course:   The patient underwent the surgery above.  Postoperatively, the patient gradually mobilized and advanced to a solid diet.  Pain and other symptoms were treated aggressively.  He did have an episode of loose stools controlled with Pepto-Bismol and probiotics. Hemoglobin drifted down. All anticoagulation held. Stabilized for 48 hours.  By the time of discharge, the patient was walking well the hallways, eating food, having flatus.  Pain was well-controlled on an oral medications.  Based on meeting  discharge criteria and continuing to recover, I felt it was safe for the patient to be discharged from the hospital to further recover with close followup. Okay to resume all home medications including Xerelto.  Postoperative recommendations were discussed in detail.  They are written as well.   Significant Diagnostic Studies:  Results for orders placed or performed during the hospital encounter of 06/15/14 (from the past 72 hour(s))  Glucose, capillary     Status: Abnormal   Collection Time: 06/18/14  4:41 PM  Result Value Ref Range   Glucose-Capillary 146 (H) 70 - 99 mg/dL  Glucose, capillary     Status: Abnormal   Collection Time: 06/18/14  9:55 PM  Result Value Ref Range   Glucose-Capillary 130 (H) 70 - 99 mg/dL  Potassium     Status: Abnormal   Collection Time: 06/19/14  5:21 AM  Result Value Ref Range   Potassium 3.6 (L) 3.7 - 5.3 mEq/L  Creatinine, serum     Status: Abnormal   Collection Time: 06/19/14  5:21 AM  Result Value Ref Range   Creatinine, Ser 1.01 0.50 - 1.35 mg/dL   GFR calc non Af Amer 78 (L) >90 mL/min   GFR calc Af Amer >90 >90 mL/min    Comment: (NOTE) The eGFR has been calculated using the CKD EPI equation. This calculation has not been validated in all clinical situations. eGFR's persistently <90 mL/min signify possible Chronic Kidney Disease.  CBC     Status: Abnormal   Collection Time: 06/19/14  5:21 AM  Result Value Ref Range   WBC 10.4 4.0 - 10.5 K/uL   RBC 2.45 (L) 4.22 - 5.81 MIL/uL   Hemoglobin 8.1 (L) 13.0 - 17.0 g/dL   HCT 23.1 (L) 39.0 - 52.0 %   MCV 94.3 78.0 - 100.0 fL  MCH 33.1 26.0 - 34.0 pg   MCHC 35.1 30.0 - 36.0 g/dL   RDW 12.4 11.5 - 15.5 %   Platelets 244 150 - 400 K/uL  Glucose, capillary     Status: Abnormal   Collection Time: 06/19/14  7:28 AM  Result Value Ref Range   Glucose-Capillary 145 (H) 70 - 99 mg/dL  Glucose, capillary     Status: Abnormal   Collection Time: 06/19/14 11:41 AM  Result Value Ref Range    Glucose-Capillary 121 (H) 70 - 99 mg/dL  Glucose, capillary     Status: Abnormal   Collection Time: 06/19/14  5:00 PM  Result Value Ref Range   Glucose-Capillary 127 (H) 70 - 99 mg/dL  Glucose, capillary     Status: Abnormal   Collection Time: 06/19/14  8:37 PM  Result Value Ref Range   Glucose-Capillary 121 (H) 70 - 99 mg/dL   Comment 1 Documented in Chart    Comment 2 Notify RN   Potassium     Status: Abnormal   Collection Time: 06/20/14  4:03 AM  Result Value Ref Range   Potassium 3.5 (L) 3.7 - 5.3 mEq/L  Creatinine, serum     Status: Abnormal   Collection Time: 06/20/14  4:03 AM  Result Value Ref Range   Creatinine, Ser 0.92 0.50 - 1.35 mg/dL   GFR calc non Af Amer 89 (L) >90 mL/min   GFR calc Af Amer >90 >90 mL/min    Comment: (NOTE) The eGFR has been calculated using the CKD EPI equation. This calculation has not been validated in all clinical situations. eGFR's persistently <90 mL/min signify possible Chronic Kidney Disease.   CBC     Status: Abnormal   Collection Time: 06/20/14  4:03 AM  Result Value Ref Range   WBC 8.1 4.0 - 10.5 K/uL   RBC 2.43 (L) 4.22 - 5.81 MIL/uL   Hemoglobin 8.4 (L) 13.0 - 17.0 g/dL   HCT 22.7 (L) 39.0 - 52.0 %   MCV 93.4 78.0 - 100.0 fL   MCH 34.6 (H) 26.0 - 34.0 pg   MCHC 37.0 (H) 30.0 - 36.0 g/dL   RDW 12.2 11.5 - 15.5 %   Platelets 262 150 - 400 K/uL  Glucose, capillary     Status: Abnormal   Collection Time: 06/20/14  9:11 AM  Result Value Ref Range   Glucose-Capillary 219 (H) 70 - 99 mg/dL    No results found.  Discharge Exam: Blood pressure 164/82, pulse 66, temperature 98.4 F (36.9 C), temperature source Oral, resp. rate 18, height 5' 8" (1.727 m), weight 178 lb 3.2 oz (80.831 kg), SpO2 99 %.  General: Pt awake/alert/oriented x4 in no major acute distress Eyes: PERRL, normal EOM. Sclera nonicteric Neuro: CN II-XII intact w/o focal sensory/motor deficits. Lymph: No head/neck/groin lymphadenopathy Psych:  No  delerium/psychosis/paranoia HENT: Normocephalic, Mucus membranes moist.  No thrush Neck: Supple, No tracheal deviation Chest: No pain.  Good respiratory excursion. CV:  Pulses intact.  Regular rhythm MS: Normal AROM mjr joints.  No obvious deformity Abdomen: Soft, Nondistended.  Min tender at incisions.  No incarcerated hernias. Ext:  SCDs BLE.  No significant edema.  No cyanosis Skin: No petechiae / purpura  Discharged Condition: good   Past Medical History  Diagnosis Date  . Hypertension   . Headache(784.0)   . Pneumonia   . 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   Marland Kitchen PNA (pneumonia) 06/26/2011  . Compression fracture of thoracic vertebra 06/26/2011  . Fistula, bladder   . Colon polyps   . Diabetes mellitus type 2 in nonobese   . Cancer 2012    Prostate surgery  . 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 Atrial fibrillation     one episode sept 2015  . Paroxysmal atrial fibrillation 05/19/2014  . Sepsis due to urinary tract infection 04/16/2014  . Urinary tract infection 04/14/2014  . History of adenomatous polyp of colon 06/14/2014    Past Surgical History  Procedure Laterality Date  . Robot assisted laparoscopic radical prostatectomy  01/31/2011    Robotic-assisted laparoscopic radical retropubic  . Lipoma excision  2012    Dr Nonah Mattes  . Prostatectomy  01/2011  . Colonoscopy N/A 06/14/2014    Procedure: COLONOSCOPY;  Surgeon: Gatha Mayer, MD;  Location: [facility];  Service: Endoscopy;  Laterality: N/A;  . Proctoscopy N/A 06/15/2014    Procedure: RIGID PROCTOSCOPY;  Surgeon: Michael Boston, MD;  Location: [facility];  Service: General;  Laterality: N/A;  . Cystoscopy with stent placement Bilateral 06/15/2014    Procedure: CYSTOSCOPY WITH BILATERAL STENT PLACEMENT;  Surgeon: Bernestine Amass, MD;  Location: [facility];  Service:  Urology;  Laterality: Bilateral;    History   Social History  . Marital Status: Married    Spouse Name: N/A    Number of Children: 2  . 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 Smoking status: Former Smoker -- 2.00 packs/day for 25 years    Types: Cigarettes    Quit date: 04/14/2014  . Smokeless tobacco: Never Used  . Alcohol Use: No     Comment: none since 04-14-2014  . Drug Use: No  . Sexual Activity: Not Currently   Other Topics Concern  . Not on file   Social History Narrative    Family History  Problem Relation Age of Onset  . Atrial fibrillation Father     onset 4s. Had pacemaker placed for sinus pause  . Prostate cancer Father   . Breast cancer Mother   . Lung cancer Mother   . Colon cancer Neg Hx   . Leukemia Brother   . Colon polyps Brother   . Diabetes Neg Hx     No current facility-administered medications for this encounter.   Current Outpatient Prescriptions  Medication Sig Dispense Refill  . clonazePAM (KLONOPIN) 0.5 MG tablet Take 0.5 mg by mouth 2 (two) times daily as needed for anxiety.    Marland Kitchen diltiazem (CARDIZEM CD) 240 MG 24 hr capsule Take 240 mg by mouth every morning.    . labetalol (NORMODYNE) 300 MG tablet Take 300 mg by mouth 3 (three) times daily.    Marland Kitchen Naphazoline-Pheniramine (NAPHCON-A OP) Apply 1-2 drops to eye daily as needed (drye eyes).    . nicotine (NICODERM CQ - DOSED IN MG/24 HOURS) 21 mg/24hr patch Place 21 mg onto the skin daily.    Marland Kitchen olmesartan (BENICAR) 40 MG tablet Take 40 mg by mouth every morning.    . tamsulosin (FLOMAX) 0.4 MG CAPS capsule Take 0.4 mg by mouth daily with supper.    Marland Kitchen aspirin EC 81 MG tablet Take 81 mg by mouth every morning.    . feeding supplement, GLUCERNA SHAKE, (GLUCERNA SHAKE) LIQD Take 237  mLs by mouth 2 (two) times daily between meals.    Marland Kitchen glipiZIDE (GLUCOTROL) 5 MG tablet Take 2.5 mg by mouth daily before breakfast.    .  isosorbide-hydrALAZINE (BIDIL) 20-37.5 MG per tablet Take 1 tablet by mouth 2 (two) times daily. 30 tablet 2  . Multiple Vitamins-Minerals (MULTIVITAMINS THER. W/MINERALS) TABS Take 1 tablet by mouth every morning.     Marland Kitchen oxyCODONE (OXY IR/ROXICODONE) 5 MG immediate release tablet Take 1-2 tablets (5-10 mg total) by mouth every 4 (four) hours as needed for moderate pain, severe pain or breakthrough pain. 40 tablet 0  . oxyCODONE-acetaminophen (PERCOCET/ROXICET) 5-325 MG per tablet Take 1-2 tablets by mouth every 4 (four) hours as needed for moderate pain. 30 tablet 0  . rivaroxaban (XARELTO) 20 MG TABS tablet Take 1 tablet (20 mg total) by mouth daily with supper. HOLD - DO NOT TAKE AGAIN UNTIL TOLD 30 tablet   . UNABLE TO FIND as needed. Naphcon a otc eye drops    . zolpidem (AMBIEN) 5 MG tablet Take 5 mg by mouth at bedtime as needed for sleep.        Allergies  Allergen Reactions  . Hydrochlorothiazide     Pt gets hyponatremia  . Lasix [Furosemide]     Sodium levels drop when take  . Nsaids     Kidney disease/failure  . Sulfa Antibiotics Other (See Comments)    headaches    Disposition: 01-Home or Self Care  Discharge Instructions    Call MD for:  extreme fatigue    Complete by:  As directed      Call MD for:  hives    Complete by:  As directed      Call MD for:  persistant nausea and vomiting    Complete by:  As directed      Call MD for:  redness, tenderness, or signs of infection (pain, swelling, redness, odor or green/yellow discharge around incision site)    Complete by:  As directed      Call MD for:  severe uncontrolled pain    Complete by:  As directed      Call MD for:    Complete by:  As directed   Temperature > 101.66F     Diet - low sodium heart healthy    Complete by:  As directed      Diet - low sodium heart healthy    Complete by:  As directed      Discharge instructions    Complete by:  As directed   Please see discharge instruction sheets.  Also refer to  handout given an office.  Please call our office if you have any questions or concerns [phone]     Discharge instructions    Complete by:  As directed   [facility]  LAPAROSCOPIC SURGERY - POST-OP INSTRUCTIONS  Always review your discharge instruction sheet given to you by the facility where your surgery was performed.  A prescription for pain medication may be given to you upon discharge.  Take your pain medication as prescribed.  If narcotic pain medicine is not needed, then you may take acetaminophen (Tylenol) or ibuprofen (Advil) as needed.  Take your usually prescribed medications unless otherwise directed.  If you need a refill on your pain medication, please contact your pharmacy.  They will contact our office to request authorization. Prescriptions will not be filled after 5 P.M. or on weekends.  You should follow a light diet the  first few days after arrival home, such as soup and crackers or toast.  Be sure to include plenty of fluids daily.  Most patients will experience some swelling and bruising in the area of the incisions.  Ice packs will help.  Swelling and bruising can take several days to resolve.   It is common to experience some constipation if taking pain medication after surgery.  Increasing fluid intake and taking a stool softener (such as Colace) will usually help or prevent this problem from occurring.  A mild laxative (Milk of Magnesia or Miralax) should be taken according to package instructions if there are no bowel movements after 48 hours.  Unless discharge instructions indicate otherwise, you may remove your bandages 24-48 hours after surgery, and you may shower at that time.  You may have steri-strips (small skin tapes) in place directly over the incision.  These strips should be left on the skin for 7-10 days.  If your surgeon used skin glue on the incision, you may shower in 24 hours.  The glue will flake off over the next 2-3 weeks.   Any sutures or staples will be removed at the office during your follow-up visit.  ACTIVITIES:  You may resume regular (light) daily activities beginning the next day-such as daily self-care, walking, climbing stairs-gradually increasing activities as tolerated.  You may have sexual intercourse when it is comfortable.  Refrain from any heavy lifting or straining until approved by your doctor.  You may drive when you are no longer taking prescription pain medication, you can comfortably wear a seatbelt, and you can safely maneuver your car and apply brakes.  You should see your doctor in the office for a follow-up appointment approximately 2-3 weeks after your surgery.  Make sure that you call for this appointment within a day or two after you arrive home to insure a convenient appointment time.  WHEN TO CALL YOUR DOCTOR: Fever over 101.0 Inability to urinate Continued bleeding from incision Increased pain, redness, or drainage from the incision Increasing abdominal pain  The clinic staff is available to answer your questions during regular business hours.  Please don't hesitate to call and ask to speak to one of the nurses for clinical concerns.  If you have a medical emergency, go to the nearest emergency room or call 911.  A surgeon from [facility] is always on call for the hospital.  Earnstine Regal, MD, [facility] Office: [place] Free:  [phone] FAX [phone]  Web site: [email or web address]     Discharge wound care:    Complete by:  As directed   If you have closed incisions, shower and bathe over these incisions with soap and water every day.  Remove all surgical dressings on postoperative day #3.  You do not need to replace dressings over the closed incisions unless you feel more comfortable with a Band-Aid covering it.   If you have an open wound that requires packing, please see wound care instructions.  In  general, remove all dressings, wash wound with soap and water and then replace with saline moistened gauze.  Do the dressing change at least every day.  Please call our office [phone] if you have further questions.     Driving Restrictions    Complete by:  As directed   No driving until off narcotics and can safely swerve away without pain during an emergency     Increase activity slowly    Complete by:  As directed   Walk an hour a day.  Use 20-30 minute walks.  When you can walk 30 minutes without difficulty, increase to low impact/moderate activities such as biking, jogging, swimming, sexual activity..  Eventually can increase to unrestricted activity when not feeling pain.  If you feel pain: STOP!Marland Kitchen   Let pain protect you from overdoing it.  Use ice/heat/over-the-counter pain medications to help minimize his soreness.  Use pain prescriptions as needed to remain active.  It is better to take extra pain medications and be more active than to stay bedridden to avoid all pain medications.     Increase activity slowly    Complete by:  As directed      Lifting restrictions    Complete by:  As directed   Avoid heavy lifting initially.  Do not push through pain.  You have no specific weight limit.  Coughing and sneezing or four more stressful to your incision than any lifting you will do. Pain will protect you from injury.  Therefore, avoid intense activity until off all narcotic pain medications.  Coughing and sneezing or four more stressful to your incision than any lifting he will do.     May shower / Bathe    Complete by:  As directed      May walk up steps    Complete by:  As directed      No dressing needed    Complete by:  As directed      Sexual Activity Restrictions    Complete by:  As directed   Sexual activity as tolerated.  Do not push through pain.  Pain will protect you from injury.     Walk with assistance    Complete by:  As directed   Walk over an hour a day.  May use a  walker/cane/companion to help with balance and stamina.            Medication List    STOP taking these medications        levofloxacin 500 MG tablet  Commonly known as:  LEVAQUIN      TAKE these medications        aspirin EC 81 MG tablet  Take 81 mg by mouth every morning.     clonazePAM 0.5 MG tablet  Commonly known as:  KLONOPIN  Take 0.5 mg by mouth 2 (two) times daily as needed for anxiety.     diltiazem 240 MG 24 hr capsule  Commonly known as:  CARDIZEM CD  Take 240 mg by mouth every morning.     feeding supplement (GLUCERNA SHAKE) Liqd  Take 237 mLs by mouth 2 (two) times daily between meals.     glipiZIDE 5 MG tablet  Commonly known as:  GLUCOTROL  Take 2.5 mg by mouth daily before breakfast.     isosorbide-hydrALAZINE 20-37.5 MG per tablet  Commonly known as:  BIDIL  Take 1 tablet by mouth 2 (two) times daily.     labetalol 300 MG tablet  Commonly known as:  NORMODYNE  Take 300 mg by mouth 3 (three) times daily.     multivitamins ther. w/minerals Tabs tablet  Take 1 tablet by mouth every morning.     NAPHCON-A OP  Apply 1-2 drops to eye daily as needed (drye eyes).     nicotine 21 mg/24hr patch  Commonly known as:  NICODERM CQ - dosed in mg/24 hours  Place 21 mg onto the skin daily.     olmesartan 40  MG tablet  Commonly known as:  BENICAR  Take 40 mg by mouth every morning.     oxyCODONE 5 MG immediate release tablet  Commonly known as:  Oxy IR/ROXICODONE  Take 1-2 tablets (5-10 mg total) by mouth every 4 (four) hours as needed for moderate pain, severe pain or breakthrough pain.     oxyCODONE-acetaminophen 5-325 MG per tablet  Commonly known as:  PERCOCET/ROXICET  Take 1-2 tablets by mouth every 4 (four) hours as needed for moderate pain.     rivaroxaban 20 MG Tabs tablet  Commonly known as:  XARELTO  Take 1 tablet (20 mg total) by mouth daily with supper. HOLD - DO NOT TAKE AGAIN UNTIL TOLD     tamsulosin 0.4 MG Caps capsule  Commonly  known as:  FLOMAX  Take 0.4 mg by mouth daily with supper.     UNABLE TO FIND  as needed. Naphcon a otc eye drops     zolpidem 5 MG tablet  Commonly known as:  AMBIEN  Take 5 mg by mouth at bedtime as needed for sleep.           Follow-up Information    Follow up with Dheeraj Hail C., MD In 2 weeks.   Specialty:  General Surgery   Why:  To follow up after your operation, To follow up after your hospital stay   Contact information:   [place] [address] [phone]       Follow up with [name]., MD. Schedule an appointment as soon as possible for a visit in 2 weeks.   Specialty:  General Surgery   Why:  For wound re-check   Contact information:   [place] [address] [phone]        Signed: Morton Peters, M.D., F.A.C.S. Gastrointestinal and Minimally Invasive Surgery [facility] [address], [place], [place] [address] [phone] Main / Paging   06/21/2014, 2:55 PM

## 2014-07-01 ENCOUNTER — Other Ambulatory Visit (HOSPITAL_COMMUNITY): Payer: Self-pay | Admitting: Interventional Radiology

## 2014-07-01 DIAGNOSIS — I729 Aneurysm of unspecified site: Secondary | ICD-10-CM

## 2014-07-16 ENCOUNTER — Other Ambulatory Visit: Payer: Self-pay | Admitting: Radiology

## 2014-07-20 NOTE — Pre-Procedure Instructions (Signed)
Douglas Ballard  07/20/2014   Your procedure is scheduled on:  Wednesday July 28, 2014 at 8:30 AM.  Report to [facility] Admitting at 6:30 AM.  Call this number if you have problems the morning of surgery: [phone]   Remember:   Do not eat food or drink liquids after midnight.   Take these medicines the morning of surgery with A SIP OF WATER: Aspirin 325 mg, Clonazepam (Klonopin) if needed, Clopidogrel (Plavix), Diltiazem (Cardizem), Isosorbide (Bidil), Labetalol (Normodyne), Eye drops if needed, Oxycodone if needed for pain   Do NOT take any diabetic medications the morning of your surgery   Please take Plavix and Aspirin 5 days prior to surgery   Do not wear jewelry.  Do not wear lotions, powders, or cologne.   Men may shave face and neck.  Do not bring valuables to the hospital.  [facility] is not responsible for any belongings or valuables.               Contacts, dentures or bridgework may not be worn into surgery.  Leave suitcase in the car. After surgery it may be brought to your room.  For patients admitted to the hospital, discharge time is determined by your treatment team.               Patients discharged the day of surgery will not be allowed to drive home.  Name and phone number of your driver:   Special Instructions: Shower using CHG soap the night before and the morning of your surgery   Please read over the following fact sheets that you were given: Pain Booklet, Coughing and Deep Breathing and Surgical Site Infection Prevention

## 2014-07-21 ENCOUNTER — Encounter (HOSPITAL_COMMUNITY)
Admission: RE | Admit: 2014-07-21 | Discharge: 2014-07-21 | Disposition: A | Discharge status: Home / Self Care | Payer: BC Managed Care – PPO | Source: Ambulatory Visit | Attending: Interventional Radiology | Admitting: Interventional Radiology

## 2014-07-21 ENCOUNTER — Encounter (HOSPITAL_COMMUNITY): Payer: Self-pay

## 2014-07-21 DIAGNOSIS — I480 Paroxysmal atrial fibrillation: Secondary | ICD-10-CM | POA: Diagnosis not present

## 2014-07-21 DIAGNOSIS — C61 Malignant neoplasm of prostate: Secondary | ICD-10-CM | POA: Diagnosis not present

## 2014-07-21 DIAGNOSIS — Z01818 Encounter for other preprocedural examination: Secondary | ICD-10-CM | POA: Diagnosis not present

## 2014-07-21 DIAGNOSIS — E119 Type 2 diabetes mellitus without complications: Secondary | ICD-10-CM | POA: Diagnosis not present

## 2014-07-21 DIAGNOSIS — I440 Atrioventricular block, first degree: Secondary | ICD-10-CM | POA: Diagnosis not present

## 2014-07-21 DIAGNOSIS — I10 Essential (primary) hypertension: Secondary | ICD-10-CM | POA: Diagnosis not present

## 2014-07-21 DIAGNOSIS — Z8673 Personal history of transient ischemic attack (TIA), and cerebral infarction without residual deficits: Secondary | ICD-10-CM | POA: Diagnosis not present

## 2014-07-21 DIAGNOSIS — F1010 Alcohol abuse, uncomplicated: Secondary | ICD-10-CM | POA: Diagnosis not present

## 2014-07-21 DIAGNOSIS — Z9049 Acquired absence of other specified parts of digestive tract: Secondary | ICD-10-CM | POA: Diagnosis not present

## 2014-07-21 DIAGNOSIS — N321 Vesicointestinal fistula: Secondary | ICD-10-CM | POA: Diagnosis not present

## 2014-07-21 HISTORY — DX: Anxiety disorder, unspecified: F41.9

## 2014-07-21 HISTORY — DX: Nocturia: R35.1

## 2014-07-21 LAB — HEPATIC FUNCTION PANEL
ALT: 13 U/L (ref 0–53)
AST: 14 U/L (ref 0–37)
Albumin: 3.7 g/dL (ref 3.5–5.2)
Alkaline Phosphatase: 50 U/L (ref 39–117)
Bilirubin, Direct: <0.2 mg/dL (ref 0.0–0.3)
Total Bilirubin: 0.3 mg/dL (ref 0.3–1.2)
Total Protein: 7 g/dL (ref 6.0–8.3)

## 2014-07-21 LAB — CBC WITH DIFFERENTIAL/PLATELET
Basophils Absolute: 0.1 10*3/uL (ref 0.0–0.1)
Basophils Relative: 1 % (ref 0–1)
Eosinophils Absolute: 0.4 10*3/uL (ref 0.0–0.7)
Eosinophils Relative: 5 % (ref 0–5)
HEMATOCRIT: 31.5 % — AB (ref 39.0–52.0)
HEMOGLOBIN: 10.8 g/dL — AB (ref 13.0–17.0)
LYMPHS PCT: 20 % (ref 12–46)
Lymphs Abs: 1.8 10*3/uL (ref 0.7–4.0)
MCH: 32.3 pg (ref 26.0–34.0)
MCHC: 34.3 g/dL (ref 30.0–36.0)
MCV: 94.3 fL (ref 78.0–100.0)
MONO ABS: 0.8 10*3/uL (ref 0.1–1.0)
MONOS PCT: 9 % (ref 3–12)
NEUTROS ABS: 5.9 10*3/uL (ref 1.7–7.7)
Neutrophils Relative %: 65 % (ref 43–77)
Platelets: 304 10*3/uL (ref 150–400)
RBC: 3.34 MIL/uL — ABNORMAL LOW (ref 4.22–5.81)
RDW: 12.6 % (ref 11.5–15.5)
WBC: 8.9 10*3/uL (ref 4.0–10.5)

## 2014-07-21 LAB — PROTIME-INR
INR: 1.65 — AB (ref 0.00–1.49)
Prothrombin Time: 19.7 seconds — ABNORMAL HIGH (ref 11.6–15.2)

## 2014-07-21 NOTE — Progress Notes (Signed)
PCP is Juanita Craver, Cardiologist is Peter Martinique, Nephrologist is Rondel Oh, and Urologist is Donato Heinz. Patient denied having any acute cardiac or pulmonary issues. Nurse asked patient if he had a prescription for Plavix and patient called his wife and she stated that patient was to stop Xarelto tomorrow and start Plavix 07/22/14. Patient also informed Nurse that he had lab work drawn at [facility] on 07/19/14; patients wife faxed lab results. Nurse therefore ordered hepatic function test (as patient had a BMET) and Differential (as patient only had CBC w/o diff). Lab results were placed on chart.

## 2014-07-22 NOTE — Progress Notes (Signed)
Anesthesia Chart Review:  Pt is 63 year old male scheduled for embolization of aneurysm on 07/28/2014 with Dr. Estanislado Pandy.   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   Hospitalized 8/26 - 04/23/2014 with sepsis due to UTI and bacteremia, acute renal failure, TIA (manifested by expressive aphasia), acute diastolic heart failure, alcohol abuse with withdrawal and delirium,  new onset atrial fibrillation (likely secondary to infection and alcohol withdrawal), acute respiratory failure (due to vascular congestion and pulmonary edema).   S/p sigmoid colectomy on 06/15/2014 for which pt had cardiac clearance at low risk from Dr. Doug Sou office.   Medications include xarelto. Pt is to stop xarelto 12/2 and start plavix 07/22/2014.  Preoperative labs reviewed.  CBC 07/19/2014 on paper chart. H/H 10.8/31.5. otherwise normal. BMET 07/19/2014 from [facility] on paper chart is within normal limits. PT/INR 19.7/1.65. PT/PTT to be redrawn DOS.   EKG 05/19/2014: NSR, 1st degree AV block. Septal infarct, age undetermined.   Nuclear stress test 05/28/2014: Overall Impression: Low risk stress nuclear study demonstrating  mild baseline ST-T changes with normal myocardial perfusion and  low normal EF of 50%. LV Wall Motion: Low normal LV Function, EF 50%; NL Wall Motion  Echo 04/16/2014:  Study Conclusions  - Left ventricle: The cavity size was normal. Wall thickness wasincreased in a pattern of mild LVH. Systolic function was normal. The estimated ejection fraction was in the range of 50% to 55%. Regional wall motion abnormalities cannot be excluded. - Left atrium: The atrium was moderately dilated. - Right atrium: The atrium was moderately dilated.  If no changes, I anticipate pt can proceed with surgery as scheduled.   Willeen Cass, FNP-BC [facility]/Anesthesiology Phone: [phone] 07/22/2014 2:39 PM

## 2014-07-26 ENCOUNTER — Other Ambulatory Visit: Payer: Self-pay | Admitting: Radiology

## 2014-07-27 NOTE — Anesthesia Preprocedure Evaluation (Addendum)
Anesthesia Evaluation  Patient identified by MRN, date of birth, ID band Patient awake    Reviewed: Allergy & Precautions, H&P , NPO status , Patient's Chart, lab work & pertinent test results, reviewed documented beta blocker date and time   History of Anesthesia Complications Negative for: history of anesthetic complications  Airway Mallampati: II  TM Distance: >3 FB Neck ROM: full    Dental   Pulmonary pneumonia -, COPDformer smoker (50 pack year quit 03/2014),  breath sounds clear to auscultation        Cardiovascular Exercise Tolerance: Good hypertension, Pt. on medications + dysrhythmias Atrial Fibrillation Rhythm:regular Rate:Normal  STRESS 05/2014 low risk EF 50%, ECHO EF 55% Paroxysmal a fib   Neuro/Psych Anxiety CVA    GI/Hepatic negative GI ROS, (+)     substance abuse  alcohol use,   Endo/Other  diabetes, Well Controlled, Type 2, Oral Hypoglycemic Agents  Renal/GU      Musculoskeletal   Abdominal   Peds  Hematology   Anesthesia Other Findings   Reproductive/Obstetrics                       Anesthesia Physical Anesthesia Plan  ASA: III  Anesthesia Plan: General   Post-op Pain Management:    Induction: Intravenous  Airway Management Planned: Oral ETT  Additional Equipment: Arterial line  Intra-op Plan:   Post-operative Plan: Extubation in OR  Informed Consent: I have reviewed the patients History and Physical, chart, labs and discussed the procedure including the risks, benefits and alternatives for the proposed anesthesia with the patient or authorized representative who has indicated his/her understanding and acceptance.   Dental Advisory Given  Plan Discussed with: CRNA, Surgeon and Anesthesiologist  Anesthesia Plan Comments:      Anesthesia Quick Evaluation Plan to start with MAC for diagnostics and transition to [place] with A-line if study is amenable to  intervention

## 2014-07-28 ENCOUNTER — Encounter (HOSPITAL_COMMUNITY)
Admission: RE | Disposition: A | Discharge status: Home / Self Care | Payer: Self-pay | Source: Ambulatory Visit | Attending: Interventional Radiology

## 2014-07-28 ENCOUNTER — Ambulatory Visit (HOSPITAL_COMMUNITY): Payer: BC Managed Care – PPO | Admitting: Certified Registered Nurse Anesthetist

## 2014-07-28 ENCOUNTER — Ambulatory Visit (HOSPITAL_COMMUNITY)
Admission: RE | Admit: 2014-07-28 | Discharge: 2014-07-28 | Disposition: A | Discharge status: Home / Self Care | Payer: BC Managed Care – PPO | Source: Ambulatory Visit | Attending: Interventional Radiology | Admitting: Interventional Radiology

## 2014-07-28 ENCOUNTER — Encounter (HOSPITAL_COMMUNITY): Payer: Self-pay | Admitting: Surgery

## 2014-07-28 ENCOUNTER — Ambulatory Visit (HOSPITAL_COMMUNITY): Payer: BC Managed Care – PPO | Admitting: Emergency Medicine

## 2014-07-28 ENCOUNTER — Encounter (HOSPITAL_COMMUNITY): Payer: Self-pay

## 2014-07-28 DIAGNOSIS — I480 Paroxysmal atrial fibrillation: Secondary | ICD-10-CM | POA: Diagnosis not present

## 2014-07-28 DIAGNOSIS — I671 Cerebral aneurysm, nonruptured: Secondary | ICD-10-CM | POA: Diagnosis present

## 2014-07-28 DIAGNOSIS — Z8546 Personal history of malignant neoplasm of prostate: Secondary | ICD-10-CM | POA: Diagnosis not present

## 2014-07-28 DIAGNOSIS — Z7982 Long term (current) use of aspirin: Secondary | ICD-10-CM | POA: Diagnosis not present

## 2014-07-28 DIAGNOSIS — I10 Essential (primary) hypertension: Secondary | ICD-10-CM | POA: Diagnosis not present

## 2014-07-28 DIAGNOSIS — R51 Headache: Secondary | ICD-10-CM | POA: Insufficient documentation

## 2014-07-28 DIAGNOSIS — Z87891 Personal history of nicotine dependence: Secondary | ICD-10-CM | POA: Diagnosis not present

## 2014-07-28 DIAGNOSIS — E119 Type 2 diabetes mellitus without complications: Secondary | ICD-10-CM | POA: Insufficient documentation

## 2014-07-28 DIAGNOSIS — J449 Chronic obstructive pulmonary disease, unspecified: Secondary | ICD-10-CM | POA: Diagnosis not present

## 2014-07-28 DIAGNOSIS — I729 Aneurysm of unspecified site: Secondary | ICD-10-CM

## 2014-07-28 DIAGNOSIS — F1010 Alcohol abuse, uncomplicated: Secondary | ICD-10-CM | POA: Diagnosis not present

## 2014-07-28 HISTORY — PX: RADIOLOGY WITH ANESTHESIA: SHX6223

## 2014-07-28 LAB — GLUCOSE, CAPILLARY
Glucose-Capillary: 134 mg/dL — ABNORMAL HIGH (ref 70–99)
Glucose-Capillary: 139 mg/dL — ABNORMAL HIGH (ref 70–99)

## 2014-07-28 LAB — BASIC METABOLIC PANEL
ANION GAP: 14 (ref 5–15)
BUN: 19 mg/dL (ref 6–23)
CALCIUM: 8.6 mg/dL (ref 8.4–10.5)
CHLORIDE: 103 meq/L (ref 96–112)
CO2: 22 mEq/L (ref 19–32)
Creatinine, Ser: 1.35 mg/dL (ref 0.50–1.35)
GFR calc Af Amer: 63 mL/min — ABNORMAL LOW (ref >90)
GFR calc non Af Amer: 55 mL/min — ABNORMAL LOW (ref >90)
Glucose, Bld: 167 mg/dL — ABNORMAL HIGH (ref 70–99)
Potassium: 4.3 mEq/L (ref 3.7–5.3)
SODIUM: 139 meq/L (ref 137–147)

## 2014-07-28 LAB — APTT: APTT: 34 s (ref 24–37)

## 2014-07-28 LAB — PLATELET INHIBITION P2Y12: PLATELET FUNCTION P2Y12: 129 [PRU] — AB (ref 194–418)

## 2014-07-28 LAB — PROTIME-INR
INR: 1.05 (ref 0.00–1.49)
Prothrombin Time: 13.9 seconds (ref 11.6–15.2)

## 2014-07-28 SURGERY — RADIOLOGY WITH ANESTHESIA
Anesthesia: Monitor Anesthesia Care

## 2014-07-28 MED ORDER — LACTATED RINGERS IV SOLN
INTRAVENOUS | Status: DC | PRN
  Administered 2014-07-28 (×2): via INTRAVENOUS

## 2014-07-28 MED ORDER — ONDANSETRON HCL 4 MG/2ML IJ SOLN
INTRAMUSCULAR | Status: DC | PRN
  Administered 2014-07-28: 4 mg via INTRAVENOUS

## 2014-07-28 MED ORDER — CEFAZOLIN SODIUM-DEXTROSE 2-3 GM-% IV SOLR
INTRAVENOUS | Status: AC
  Filled 2014-07-28: qty 50

## 2014-07-28 MED ORDER — SODIUM CHLORIDE 0.9 % IV SOLN: Freq: Once | INTRAVENOUS | Status: DC

## 2014-07-28 MED ORDER — PHENYLEPHRINE HCL 10 MG/ML IJ SOLN
INTRAMUSCULAR | Status: DC | PRN
  Administered 2014-07-28 (×3): 40 ug via INTRAVENOUS

## 2014-07-28 MED ORDER — NIMODIPINE 30 MG PO CAPS
ORAL_CAPSULE | ORAL | Status: AC
  Administered 2014-07-28: 60 mg
  Filled 2014-07-28: qty 2

## 2014-07-28 MED ORDER — EPHEDRINE SULFATE 50 MG/ML IJ SOLN
INTRAMUSCULAR | Status: DC | PRN
  Administered 2014-07-28 (×2): 10 mg via INTRAVENOUS
  Administered 2014-07-28: 5 mg via INTRAVENOUS
  Administered 2014-07-28 (×3): 10 mg via INTRAVENOUS

## 2014-07-28 MED ORDER — LIDOCAINE HCL 1 % IJ SOLN
INTRAMUSCULAR | Status: AC
  Filled 2014-07-28: qty 20

## 2014-07-28 MED ORDER — IOHEXOL 300 MG/ML  SOLN
150.0000 mL | Freq: Once | INTRAMUSCULAR | Status: AC | PRN
  Administered 2014-07-28: 50 mL via INTRAVENOUS

## 2014-07-28 MED ORDER — MIDAZOLAM HCL 5 MG/5ML IJ SOLN
INTRAMUSCULAR | Status: DC | PRN
  Administered 2014-07-28: 1 mg via INTRAVENOUS

## 2014-07-28 MED ORDER — GLYCOPYRROLATE 0.2 MG/ML IJ SOLN
INTRAMUSCULAR | Status: DC | PRN
  Administered 2014-07-28: 0.2 mg via INTRAVENOUS

## 2014-07-28 MED ORDER — SODIUM CHLORIDE 0.9 % IV SOLN: INTRAVENOUS | Status: AC

## 2014-07-28 MED ORDER — ALBUMIN HUMAN 5 % IV SOLN
INTRAVENOUS | Status: DC | PRN
  Administered 2014-07-28: 09:00:00 via INTRAVENOUS

## 2014-07-28 MED ORDER — CEFAZOLIN SODIUM-DEXTROSE 2-3 GM-% IV SOLR
2.0000 g | Freq: Once | INTRAVENOUS | Status: DC
  Filled 2014-07-28: qty 50

## 2014-07-28 MED ORDER — HEPARIN SODIUM (PORCINE) 1000 UNIT/ML IJ SOLN
INTRAMUSCULAR | Status: DC | PRN
  Administered 2014-07-28 (×2): 1000 [IU] via INTRAVENOUS

## 2014-07-28 MED ORDER — FENTANYL CITRATE 0.05 MG/ML IJ SOLN
INTRAMUSCULAR | Status: DC | PRN
  Administered 2014-07-28: 25 ug via INTRAVENOUS

## 2014-07-28 NOTE — Transfer of Care (Signed)
Immediate Anesthesia Transfer of Care Note  Patient: Douglas Ballard  Procedure(s) Performed: Procedure(s): Embolization (N/A)  Patient Location: Short Stay  Anesthesia Type:MAC  Level of Consciousness: awake, alert  and oriented  Airway & Oxygen Therapy: Patient Spontanous Breathing  Post-op Assessment: Report given to PACU RN  Post vital signs: Reviewed and stable  Complications: No apparent anesthesia complications

## 2014-07-28 NOTE — Progress Notes (Signed)
Cerebral arteriogram complete. Marilynn Latino, Radiology Tech, placed exoseal to rt groin w/ pressure held x 61min.  3+ bounding pulses rt dorsalis pedis and posterior tib. Gauze and pressure tape applied to site. Pt tolerated well. Report called to Helane Gunther, RN in short stay. VSS. Pt awake, alert and oriented.

## 2014-07-28 NOTE — H&P (Signed)
Chief Complaint: L posterior communicating artery aneurysm  Referring Physician(s): Cleopatra Sardo K  History of Present Illness: Douglas Ballard is a 63 y.o. male  Discovered L PCOM aneurysm while in hospital with UTI/colovesicular fistula 04/2014 Pt had suffered TIA symptoms in while admitted- work up revealed aneurysm Has since had surgery for fistula correction Consulted with Dr Estanislado Pandy 04/2014 Now scheduled for cerebral arteriogram with possible embolization of L PCOM aneurysm Last dose Xarelto 12/3 On Plavix since then  Past Medical History  Diagnosis Date  . Hypertension   . Headache(784.0)   . 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   Marland Kitchen Compression fracture of thoracic vertebra 06/26/2011  . Fistula, bladder   . Colon polyps   . Diabetes mellitus type 2 in nonobese   . Cancer 2012    Prostate surgery  . 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 Atrial fibrillation     one episode sept 2015  . Paroxysmal atrial fibrillation 05/19/2014  . Sepsis due to urinary tract infection 04/16/2014  . Urinary tract infection 04/14/2014  . History of adenomatous polyp of colon 06/14/2014  . Pneumonia     hx of  . PNA (pneumonia) 06/26/2011  . Anxiety   . Frequent urination at night   . Dry eyes     Past Surgical History  Procedure Laterality Date  . Robot assisted laparoscopic radical prostatectomy  01/31/2011    Robotic-assisted laparoscopic radical retropubic  . Lipoma excision  2012    Dr Nonah Mattes  . Prostatectomy  01/2011  . Colonoscopy N/A 06/14/2014    Procedure: COLONOSCOPY;  Surgeon: Gatha Mayer, MD;  Location: [facility];  Service: Endoscopy;  Laterality: N/A;  . Proctoscopy N/A 06/15/2014    Procedure: RIGID PROCTOSCOPY;  Surgeon: Michael Boston, MD;  Location: [facility];  Service: General;  Laterality: N/A;  .  Cystoscopy with stent placement Bilateral 06/15/2014    Procedure: CYSTOSCOPY WITH BILATERAL STENT PLACEMENT;  Surgeon: Bernestine Amass, MD;  Location: [facility];  Service: Urology;  Laterality: Bilateral;    Allergies: Hydrochlorothiazide; Lasix; Nsaids; and Sulfa antibiotics  Medications: Prior to Admission medications   Medication Sig Start Date End Date Taking? Authorizing Provider  aspirin 325 MG tablet Take 325 mg by mouth daily.    Historical Provider, MD  aspirin EC 81 MG tablet Take 81 mg by mouth every morning.    Historical Provider, MD  clonazePAM (KLONOPIN) 0.5 MG tablet Take 0.5 mg by mouth 2 (two) times daily as needed for anxiety.    Historical Provider, MD  clopidogrel (PLAVIX) 75 MG tablet Take 75 mg by mouth daily.    Historical Provider, MD  diltiazem (CARDIZEM CD) 240 MG 24 hr capsule Take 240 mg by mouth every morning.    Historical Provider, MD  feeding supplement, GLUCERNA SHAKE, (GLUCERNA SHAKE) LIQD Take 237 mLs by mouth 2 (two) times daily between meals.    Historical Provider, MD  glipiZIDE (GLUCOTROL) 5 MG tablet Take 2.5 mg by mouth daily before breakfast.    Historical Provider, MD  isosorbide-hydrALAZINE (BIDIL) 20-37.5 MG per tablet Take 1 tablet by mouth 2 (two) times daily. 06/15/14   Michael Boston, MD  labetalol (NORMODYNE) 300 MG tablet Take 300 mg by mouth 3 (three) times daily.    Historical Provider, MD  Multiple Vitamins-Minerals (MULTIVITAMINS  THER. W/MINERALS) TABS Take 1 tablet by mouth every morning.     Historical Provider, MD  Naphazoline-Pheniramine (NAPHCON-A OP) Apply 1-2 drops to eye daily as needed (drye eyes).    Historical Provider, MD  nicotine (NICODERM CQ - DOSED IN MG/24 HOURS) 21 mg/24hr patch Place 21 mg onto the skin daily.    Historical Provider, MD  olmesartan (BENICAR) 40 MG tablet Take 40 mg by mouth every morning.    Historical Provider, MD  oxyCODONE (OXY IR/ROXICODONE) 5 MG immediate release tablet Take 1-2 tablets (5-10 mg total)  by mouth every 4 (four) hours as needed for moderate pain, severe pain or breakthrough pain. 06/15/14   Michael Boston, MD  oxyCODONE-acetaminophen (PERCOCET/ROXICET) 5-325 MG per tablet Take 1-2 tablets by mouth every 4 (four) hours as needed for moderate pain. Patient not taking: Reported on 07/20/2014 06/20/14   Armandina Gemma, MD  rivaroxaban (XARELTO) 20 MG TABS tablet Take 1 tablet (20 mg total) by mouth daily with supper. HOLD - DO NOT TAKE AGAIN UNTIL TOLD 06/14/14   Gatha Mayer, MD  tamsulosin (FLOMAX) 0.4 MG CAPS capsule Take 0.4 mg by mouth daily with supper.    Historical Provider, MD  [place] as needed. Naphcon a otc eye drops    Historical Provider, MD  zolpidem (AMBIEN) 5 MG tablet Take 5 mg by mouth at bedtime as needed for sleep.  12/31/13   Historical Provider, MD    Family History  Problem Relation Age of Onset  . Atrial fibrillation Father     onset 33s. Had pacemaker placed for sinus pause  . Prostate cancer Father   . Breast cancer Mother   . Lung cancer Mother   . Colon cancer Neg Hx   . Leukemia Brother   . Colon polyps Brother   . Diabetes Neg Hx     History   Social History  . Marital Status: Married    Spouse Name: N/A    Number of Children: 2  . 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 Smoking status: Former Smoker -- 2.00 packs/day for 25 years    Types: Cigarettes    Quit date: 04/14/2014  . Smokeless tobacco: Never Used  . Alcohol Use: No     Comment: none since 04-14-2014  . Drug Use: No  . Sexual Activity: Not Currently   Other Topics Concern  . None   Social History Narrative    Review of Systems: A 12 point ROS discussed and pertinent positives are indicated in the HPI above.  All other systems are negative.  Review of Systems  Constitutional: Negative for activity change, appetite change and unexpected weight change.  HENT: Negative for tinnitus and trouble  swallowing.   Respiratory: Negative for cough, chest tightness and shortness of breath.   Cardiovascular: Negative for chest pain.  Gastrointestinal: Negative for nausea, vomiting and abdominal pain.  Genitourinary: Negative for difficulty urinating.  Musculoskeletal: Negative for back pain and gait problem.  Neurological: Negative for dizziness, tremors, seizures, syncope, facial asymmetry, speech difficulty, weakness, light-headedness, numbness and headaches.  Psychiatric/Behavioral: Negative for behavioral problems, confusion and agitation.    Vital Signs: There were no vitals taken for this visit.  Physical Exam  Constitutional: He is oriented to person, place, and time. He appears well-developed and well-nourished.  Eyes: EOM are normal.  Neck: Normal range of motion.  Cardiovascular: Normal rate and regular  rhythm.   No murmur heard. Pulmonary/Chest: Effort normal and breath sounds normal. He has no wheezes.  Abdominal: Soft. Bowel sounds are normal. There is no tenderness.  Musculoskeletal: Normal range of motion.  Neurological: He is alert and oriented to person, place, and time.  Skin: Skin is warm and dry.  Psychiatric: He has a normal mood and affect. His behavior is normal. Judgment and thought content normal.    Imaging: No results found.  Labs:  CBC:  Recent Labs  06/18/14 0435 06/19/14 0521 06/20/14 0403 07/21/14 1001  WBC 12.0* 10.4 8.1 8.9  HGB 7.8* 8.1* 8.4* 10.8*  HCT 22.1* 23.1* 22.7* 31.5*  PLT 225 244 262 304    COAGS:  Recent Labs  04/21/14 1125 07/21/14 1001 07/28/14 0706  INR 1.21 1.65* 1.05  APTT 36  --  34    BMP:  Recent Labs  04/23/14 0428 06/10/14 1200 06/16/14 0335 06/18/14 0435 06/19/14 0521 06/20/14 0403  NA 131* 134* 138 136*  --   --   K 4.1 5.0 4.3 3.8 3.6* 3.5*  CL 94* 99 104 103  --   --   CO2 25 23 22 21   --   --   GLUCOSE 218* 132* 145* 104*  --   --   BUN 22 33* 18 20  --   --   CALCIUM 8.7 9.2 8.4 8.2*   --   --   CREATININE 1.38* 1.50* 1.41* 1.26 1.01 0.92  GFRNONAA 53* 48* 52* 59* 78* 89*  GFRAA 62* 56* 60* 69* >90 >90    LIVER FUNCTION TESTS:  Recent Labs  04/14/14 0655 04/15/14 0526 06/10/14 1200 07/21/14 1001  BILITOT 1.7* 1.4* 0.2* 0.3  AST 14 11 12 14   ALT 8 6 13 13   ALKPHOS 58 45 49 50  PROT 6.7 6.1 6.9 7.0  ALBUMIN 3.4* 2.7* 3.5 3.7    TUMOR MARKERS: No results for input(s): AFPTM, CEA, CA199, CHROMGRNA in the last 8760 hours.  Assessment and Plan:  L Posterior communicating artery aneurysm Noted incidentally with work up for TIAs Now scheduled for cerebral arteriogram with possible embolization Pt and wife aware of procedure benefits and risks and agreeable to proceed Consent signed andin chart Pt understands if intervention is performed he will be admitted overnight into Neuro ICU   Thank you for this interesting consult.  I greatly enjoyed meeting Anand Charity and look forward to participating in their care.    I spent a total of 20 minutes face to face in clinical consultation, greater than 50% of which was counseling/coordinating care for cerebral arteriogram L PCOM aneurysm embolization  Signed: TURPIN,PAMELA A 07/28/2014, 7:58 AM

## 2014-07-28 NOTE — Anesthesia Postprocedure Evaluation (Signed)
  Anesthesia Post-op Note  Patient: Douglas Ballard  Procedure(s) Performed: Procedure(s): Embolization (N/A)  Patient Location: Short Stay  Anesthesia Type:MAC  Level of Consciousness: awake, alert  and oriented  Airway and Oxygen Therapy: Patient Spontanous Breathing  Post-op Pain: none  Post-op Assessment: Post-op Vital signs reviewed, Respiratory Function Stable, Patent Airway and No signs of Nausea or vomiting  Post-op Vital Signs: Reviewed and stable  Last Vitals:  Filed Vitals:   07/28/14 0630  BP: 134/80  Pulse: 76  Temp: 36.4 C  Resp: 20    Complications: No apparent anesthesia complications

## 2014-07-28 NOTE — Procedures (Addendum)
S/P bilaterall common carotid arteriogram. RT CFA approach. Findings.Marland Kitchen  1.Approx 80mm x 2mm Lt PCOM infundibulum. 2..Aberrant RT subclavian artery

## 2014-07-28 NOTE — Discharge Instructions (Signed)

## 2014-07-28 NOTE — Anesthesia Postprocedure Evaluation (Signed)
  Anesthesia Post-op Note  Patient: Douglas Ballard  Procedure(s) Performed: Procedure(s): Embolization (N/A)  Patient Location: PACU  Anesthesia Type:MAC  Level of Consciousness: awake  Airway and Oxygen Therapy: Patient Spontanous Breathing  Post-op Pain: mild  Post-op Assessment: Post-op Vital signs reviewed  Post-op Vital Signs: Reviewed  Last Vitals:  Filed Vitals:   07/28/14 0630  BP: 134/80  Pulse: 76  Temp: 36.4 C  Resp: 20    Complications: No apparent anesthesia complications

## 2014-07-30 ENCOUNTER — Encounter (HOSPITAL_COMMUNITY): Payer: Self-pay | Admitting: Interventional Radiology

## 2014-08-05 ENCOUNTER — Other Ambulatory Visit: Payer: Self-pay | Admitting: Cardiology

## 2014-08-05 MED ORDER — ISOSORB DINITRATE-HYDRALAZINE 20-37.5 MG PO TABS: 1.0000 | ORAL_TABLET | Freq: Two times a day (BID) | ORAL | Status: DC

## 2014-08-05 NOTE — Telephone Encounter (Signed)
Samples of Bidil provided. Manufacturing issues with getting this from pharmacy. Wife notified samples are available for pick up

## 2014-08-05 NOTE — Telephone Encounter (Signed)
Pt would like to know if he could have samples of Bidil. If you do not have any,he can not get them at most of the pharmacy.So what can he do?

## 2014-09-03 ENCOUNTER — Other Ambulatory Visit: Payer: Self-pay | Admitting: Cardiology

## 2014-09-03 NOTE — Telephone Encounter (Signed)
Pt would like some samples of Bidil please.

## 2014-09-03 NOTE — Telephone Encounter (Signed)
Samples left for patient at front desk. Spoke to patient wife [facility]) voiced understanding, states she will be by on Monday to pick them up

## 2014-09-29 ENCOUNTER — Telehealth: Payer: Self-pay | Admitting: Cardiology

## 2014-09-29 MED ORDER — ISOSORB DINITRATE-HYDRALAZINE 20-37.5 MG PO TABS: 1.0000 | ORAL_TABLET | Freq: Two times a day (BID) | ORAL | Status: DC

## 2014-09-29 NOTE — Telephone Encounter (Signed)
°  1. Which medications need to be refilled? Isosorbide- Hydralazine   2. Which pharmacy is medication to be sent to?[facility] on [address]  3. Do they need a 30 day or 90 day supply? She did not specify  4. Would they like a call back once the medication has been sent to the pharmacy? Yes , she stated that he only had a couple of days left  *She also stated that the pt will be having a toenail removed and Dr. Leonette Nutting is requesting that he stop taking is Xarleto 7 days prior to the procedure. Please f/u*

## 2014-09-29 NOTE — Telephone Encounter (Signed)
He does not need to stop Xarelto for toenail removal- low bleeding risk  Peter Martinique MD, [facility]

## 2014-09-29 NOTE — Telephone Encounter (Signed)
Does pt. Need to stop his xarelto to have a toenail removed

## 2014-09-29 NOTE — Addendum Note (Signed)
Addended by: Golden Hurter D on: 09/29/2014 06:09 PM   Modules accepted: Orders

## 2014-09-29 NOTE — Telephone Encounter (Signed)
Returned call to patient's wife Dr.Jordan advised does not have to stop xarelto for toenail to be removed.Bildil refill sent to pharmacy.

## 2014-09-30 ENCOUNTER — Telehealth: Payer: Self-pay | Admitting: Cardiology

## 2014-09-30 NOTE — Telephone Encounter (Signed)
Pt. Having toenail removed ca he hold his Xarelto three days prior

## 2014-09-30 NOTE — Telephone Encounter (Signed)
Pt is going to have toe nail removed.Can he stop his Xarelto 3 days prior to removal?

## 2014-10-01 NOTE — Telephone Encounter (Signed)
He probably doesn't need to stop Xarelto for toenail removal since bleeding risk is low. If you feel it needs to be held I would only hold for 24 hours.  Tammela Bales Martinique MD, [facility]

## 2014-10-05 NOTE — Telephone Encounter (Signed)
Returned call to [facility] [place] advised probably doesn't need to stop Xarelto for toenail removal.Advised if you feel needs to held only hold for 24 hrs.Note faxed to [facility] at fax # [phone].

## 2014-10-05 NOTE — Telephone Encounter (Signed)
Note faxed to [facility] at fax # [phone].

## 2014-11-01 ENCOUNTER — Encounter: Payer: Self-pay | Admitting: Cardiology

## 2014-11-01 ENCOUNTER — Ambulatory Visit (INDEPENDENT_AMBULATORY_CARE_PROVIDER_SITE_OTHER): Payer: BC Managed Care – PPO | Admitting: Cardiology

## 2014-11-01 VITALS — BP 178/88 | HR 60 | Ht 69.0 in | Wt 183.5 lb

## 2014-11-01 DIAGNOSIS — E119 Type 2 diabetes mellitus without complications: Secondary | ICD-10-CM

## 2014-11-01 DIAGNOSIS — I5032 Chronic diastolic (congestive) heart failure: Secondary | ICD-10-CM

## 2014-11-01 DIAGNOSIS — I480 Paroxysmal atrial fibrillation: Secondary | ICD-10-CM

## 2014-11-01 DIAGNOSIS — I10 Essential (primary) hypertension: Secondary | ICD-10-CM

## 2014-11-01 HISTORY — DX: Chronic diastolic (congestive) heart failure: I50.32

## 2014-11-01 NOTE — Patient Instructions (Signed)
Stop ASA  Continue your other therapy  Keep a close eye on your blood pressure  We will get your records from nephrology and Dr. Risa Grill

## 2014-11-01 NOTE — Progress Notes (Signed)
Douglas Ballard Date of Birth: 24-Aug-1950 Medical Record [id number]  History of Present Illness: Douglas Ballard is seen today for follow up of atrial fibrillation. He is a 64 yo WM with history of malignant HTN. He also has a history of tobacco and Etoh abuse, DM, and colovesical fistula. He was admitted last August with ARF, sepsis, expressive aphasia, acute diastolic heart failure and atrial fibrillation with RVR. He also has acute delirium related to Etoh withdrawal. He recovered from all this and was able to subsequently have his fistula repaired. Work included an Echo which showed EF of 50-55% and moderate biatrial enlargement. Mild LVH. Myoview perfusion was normal. He has been on anticoagulation with Xarelto.  On follow up today he reports he is doing well. He has healed from his surgery and is active. He quit smoking and Etoh. He has followed up with Nephrology in [facility] and was told his renal function was very good. The last on record here was a creatinine of 1.35 in Dec. He denies any palpitations, chest pain, or SOB. He is on multiple BP meds and reports typical reading of [id number] systolic but hasn't checked it recently.     Medication List       This list is accurate as of: 11/01/14  6:20 PM.  Always use your most recent med list.               aspirin EC 81 MG tablet  Take 81 mg by mouth every morning.     aspirin 325 MG tablet  Take 325 mg by mouth daily.     clonazePAM 0.5 MG tablet  Commonly known as:  KLONOPIN  Take 0.5 mg by mouth 2 (two) times daily as needed for anxiety.     clopidogrel 75 MG tablet  Commonly known as:  PLAVIX  Take 75 mg by mouth daily.     diltiazem 240 MG 24 hr capsule  Commonly known as:  CARDIZEM CD  Take 240 mg by mouth every morning.     feeding supplement (GLUCERNA SHAKE) Liqd  Take 237 mLs by mouth 2 (two) times daily between meals.     glipiZIDE 5 MG tablet  Commonly known as:  GLUCOTROL  Take 2.5 mg by mouth daily before  breakfast.     isosorbide-hydrALAZINE 20-37.5 MG per tablet  Commonly known as:  BIDIL  Take 1 tablet by mouth 2 (two) times daily.     labetalol 300 MG tablet  Commonly known as:  NORMODYNE  Take 300 mg by mouth 3 (three) times daily.     multivitamins ther. w/minerals Tabs tablet  Take 1 tablet by mouth every morning.     NAPHCON-A OP  Apply 1-2 drops to eye daily as needed (drye eyes).     nicotine 21 mg/24hr patch  Commonly known as:  NICODERM CQ - dosed in mg/24 hours  Place 21 mg onto the skin daily.     olmesartan 40 MG tablet  Commonly known as:  BENICAR  Take 40 mg by mouth every morning.     oxyCODONE 5 MG immediate release tablet  Commonly known as:  Oxy IR/ROXICODONE  Take 1-2 tablets (5-10 mg total) by mouth every 4 (four) hours as needed for moderate pain, severe pain or breakthrough pain.     oxyCODONE-acetaminophen 5-325 MG per tablet  Commonly known as:  PERCOCET/ROXICET  Take 1-2 tablets by mouth every 4 (four) hours as needed for moderate pain.     rivaroxaban 20  MG Tabs tablet  Commonly known as:  XARELTO  Take 1 tablet (20 mg total) by mouth daily with supper. HOLD - DO NOT TAKE AGAIN UNTIL TOLD     tamsulosin 0.4 MG Caps capsule  Commonly known as:  FLOMAX  Take 0.4 mg by mouth daily with supper.     UNABLE TO FIND  as needed. Naphcon a otc eye drops     zolpidem 5 MG tablet  Commonly known as:  AMBIEN  Take 5 mg by mouth at bedtime as needed for sleep.         Allergies  Allergen Reactions  . Hydrochlorothiazide     Pt gets hyponatremia  . Lasix [Furosemide]     Sodium levels drop when take  . Nsaids     Kidney disease/failure  . Sulfa Antibiotics Other (See Comments)    headaches    Past Medical History  Diagnosis Date  . Hypertension   . Headache(784.0)   . 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 Alcohol withdrawal seizure 06/28/2011    Alcohol withdraw seizure prior to admission is suspected from history given by family & Post  ictal appearance in the ED.   Marland Kitchen Compression fracture of thoracic vertebra 06/26/2011  . Fistula, bladder   . Colon polyps   . Diabetes mellitus type 2 in nonobese   . Cancer 2012    Prostate surgery  . 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 Atrial fibrillation     one episode sept 2015  . Paroxysmal atrial fibrillation 05/19/2014  . Sepsis due to urinary tract infection 04/16/2014  . Urinary tract infection 04/14/2014  . History of adenomatous polyp of colon 06/14/2014  . Pneumonia     hx of  . PNA (pneumonia) 06/26/2011  . Anxiety   . Frequent urination at night   . Dry eyes     Past Surgical History  Procedure Laterality Date  . Robot assisted laparoscopic radical prostatectomy  01/31/2011    Robotic-assisted laparoscopic radical retropubic  . Lipoma excision  2012    Dr Nonah Mattes  . Prostatectomy  01/2011  . Colonoscopy N/A 06/14/2014    Procedure: COLONOSCOPY;  Surgeon: Gatha Mayer, MD;  Location: [facility];  Service: Endoscopy;  Laterality: N/A;  . Proctoscopy N/A 06/15/2014    Procedure: RIGID PROCTOSCOPY;  Surgeon: Michael Boston, MD;  Location: [facility];  Service: General;  Laterality: N/A;  . Cystoscopy with stent placement Bilateral 06/15/2014    Procedure: CYSTOSCOPY WITH BILATERAL STENT PLACEMENT;  Surgeon: Bernestine Amass, MD;  Location: [facility];  Service: Urology;  Laterality: Bilateral;  . Radiology with anesthesia N/A 07/28/2014    Procedure: Embolization;  Surgeon: Luanne Bras, MD;  Location: [facility];  Service: Radiology;  Laterality: N/A;    History   Social History  . Marital Status: Married    Spouse Name: N/A  . Number of Children: 2  . 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 Smoking status: Former Smoker -- 2.00 packs/day for 25 years    Types: Cigarettes    Quit date: 04/14/2014  . Smokeless tobacco: Never Used  . Alcohol Use: No     Comment: none since 04-14-2014  . Drug  Use: No  . Sexual Activity: Not Currently   Other Topics Concern  . None   Social History Narrative    Family History  Problem Relation  Age of Onset  . Atrial fibrillation Father     onset 3s. Had pacemaker placed for sinus pause  . Prostate cancer Father   . Breast cancer Mother   . Lung cancer Mother   . Colon cancer Neg Hx   . Leukemia Brother   . Colon polyps Brother   . Diabetes Neg Hx     Review of Systems: As noted in HPI.  All other systems were reviewed and are negative.  Physical Exam: BP 178/88 mmHg  Pulse 60  Ht 5\' 9"  (1.753 m)  Wt 183 lb 8 oz (83.235 kg)  BMI 27.09 kg/m2 Filed Weights   11/01/14 1518  Weight: 183 lb 8 oz (83.235 kg)    LABORATORY DATA: Lab Results  Component Value Date   WBC 8.9 07/21/2014   HGB 10.8* 07/21/2014   HCT 31.5* 07/21/2014   PLT 304 07/21/2014   GLUCOSE 167* 07/28/2014   CHOL 81 04/22/2014   TRIG 52 04/22/2014   HDL 41 04/22/2014   LDLCALC 30 04/22/2014   ALT 13 07/21/2014   AST 14 07/21/2014   NA 139 07/28/2014   K 4.3 07/28/2014   CL 103 07/28/2014   CREATININE 1.35 07/28/2014   BUN 19 07/28/2014   CO2 22 07/28/2014   TSH 0.639 04/17/2014   INR 1.05 07/28/2014   HGBA1C 7.0* 06/10/2014    Assessment / Plan: 1. Paroxysmal AFib. Noted when admitted with sepsis. I think he is at moderate risk for recurrence with severe HTN and biatrial enlargement. For this reason I have recommended continued anticoagulation with Xarelto. He should stop taking ASA and should avoid NSAIDs.   2. HTN- malignant. BP is elevated today. He is on high dose labetalol, clonidine, Bidil, and Benicar. I have asked him to keep a diary and let me know if it remains high. Consider adding a diuretic if renal function improved.   3. S/p repair colovesical fistula.  4. Tobacco abuse- now quit  5. Etoh abuse- abstaining.   6. CKD will request records from nephrology.   7. Chronic diastolic CHF- well compensated.

## 2014-12-27 ENCOUNTER — Telehealth: Payer: Self-pay | Admitting: Cardiology

## 2014-12-27 MED ORDER — ISOSORB DINITRATE-HYDRALAZINE 20-37.5 MG PO TABS: 1.0000 | ORAL_TABLET | Freq: Two times a day (BID) | ORAL | Status: DC

## 2014-12-27 NOTE — Telephone Encounter (Signed)
Returned call to patient's wife she stated husband has a Bidil savings card and it will pay for 90 tablets.90 tablet refill sent to pharmacy.Also stated husband's PCP is retiring.Stated he would like a new [facility] PCP.Advised schedulers will call back with appointment.

## 2014-12-27 NOTE — Telephone Encounter (Signed)
Please call,need a referral for a primary care doctor from [facility]. He also needs a prescription for Bidil. He needs a written prescription for 90 pills. He needs this to use the saving card.

## 2015-02-07 ENCOUNTER — Telehealth: Payer: Self-pay | Admitting: Cardiology

## 2015-02-07 NOTE — Telephone Encounter (Signed)
Pt's wife called in stating that the pt lost the paper of the PCP doctor that Dr.Jordan was going to refer her to . So she wanted to know if he could tell her who the  Doctor's were that she was going to be referred to.   Thanks

## 2015-02-08 NOTE — Telephone Encounter (Signed)
Returned call to patient's wife she stated husband would like a new PCP within the [facility] system.Stated he would like a male Dr.who is not going to retire soon.Schedulers will call back with appointment.

## 2015-02-15 ENCOUNTER — Other Ambulatory Visit: Payer: Self-pay | Admitting: Cardiology

## 2015-02-15 NOTE — Telephone Encounter (Signed)
Rx(s) sent to pharmacy electronically.  

## 2015-03-02 ENCOUNTER — Ambulatory Visit (INDEPENDENT_AMBULATORY_CARE_PROVIDER_SITE_OTHER): Payer: BC Managed Care – PPO | Admitting: Family

## 2015-03-02 ENCOUNTER — Encounter: Payer: Self-pay | Admitting: Family

## 2015-03-02 VITALS — BP 144/90 | HR 56 | Temp 97.6°F | Resp 18 | Ht 69.0 in | Wt 191.4 lb

## 2015-03-02 DIAGNOSIS — I10 Essential (primary) hypertension: Secondary | ICD-10-CM

## 2015-03-02 DIAGNOSIS — E119 Type 2 diabetes mellitus without complications: Secondary | ICD-10-CM | POA: Diagnosis not present

## 2015-03-02 DIAGNOSIS — Z8546 Personal history of malignant neoplasm of prostate: Secondary | ICD-10-CM | POA: Diagnosis not present

## 2015-03-02 DIAGNOSIS — I480 Paroxysmal atrial fibrillation: Secondary | ICD-10-CM | POA: Diagnosis not present

## 2015-03-02 NOTE — Assessment & Plan Note (Signed)
Stable with flomax. Questions if flomax is necessary. Start medication holiday of medication to determine any worsening of symptoms. If symptoms return, restart medication.

## 2015-03-02 NOTE — Patient Instructions (Signed)
Thank you for choosing [facility].  Summary/Instructions:  Please continue to take your medication as prescribed.   Please stop taking your Glipizide and we will recheck your A1c in October.  May trial without flomax to determine if continued need.

## 2015-03-02 NOTE — Progress Notes (Signed)
Pre visit review using our clinic review tool, if applicable. No additional management support is needed unless otherwise documented below in the visit note. 

## 2015-03-02 NOTE — Progress Notes (Signed)
Subjective:    Patient ID: Douglas Ballard, male    DOB: August 10, 1951, 64 y.o.   MRN: [id number]  Chief Complaint  Patient presents with  . Establish Care    wants to talk about medication list and trying to cut some stuff out     HPI:  Douglas Ballard is a 64 y.o. male with a PMH of hypertension, paroxysmal atrial fibrillation, diastolic CHF, alcohol abuse, prostate cancerwho presents today for an office visit to establish care.   1.) Type 2 diabetes - Currently maintained on glipizide. Takes medication as prescribed and denies adverse side effects. Reports that his previous A1c was 5.7 in June. Would like to know if he can try a medication holiday to determined continued need.  Lab Results  Component Value Date   HGBA1C 7.0* 06/10/2014   2.) Hypertension - Currently maintained diltiazem, labetalol, and olmsartan. Takes the medication as prescribed and denies adverse side effects. Currently managed by cardiology.   BP Readings from Last 3 Encounters:  03/02/15 144/90  11/01/14 178/88  07/28/14 134/80     3.) Atrial fibrillation - Noted to have paroxysmal atrial fibrillation and is anticoagulated on Xarelto. Has had 3 TIA's which is the reason for the continued Xarelto.  Takes the medication as prescribed and denies any adverse bleeding.  4.) BPH - Previous proctectomy. Maintained on floxmax. Indicates he does have urinary frequency. Takes the medication as prescribed and denies adverse side effects.     Allergies  Allergen Reactions  . Hydrochlorothiazide     Pt gets hyponatremia  . Lasix [Furosemide]     Sodium levels drop when take  . Nsaids     Kidney disease/failure  . Sulfa Antibiotics Other (See Comments)    headaches     Outpatient Prescriptions Prior to Visit  Medication Sig Dispense Refill  . diltiazem (CARDIZEM CD) 240 MG 24 hr capsule Take 240 mg by mouth every morning.    . feeding supplement, GLUCERNA SHAKE, (GLUCERNA SHAKE) LIQD Take 237 mLs by mouth  2 (two) times daily between meals.    Marland Kitchen glipiZIDE (GLUCOTROL) 5 MG tablet Take 2.5 mg by mouth daily before breakfast.    . labetalol (NORMODYNE) 300 MG tablet Take 300 mg by mouth 3 (three) times daily.    . Multiple Vitamins-Minerals (MULTIVITAMINS THER. W/MINERALS) TABS Take 1 tablet by mouth every morning.     . olmesartan (BENICAR) 40 MG tablet Take 40 mg by mouth every morning.    . rivaroxaban (XARELTO) 20 MG TABS tablet Take 1 tablet (20 mg total) by mouth daily with supper. HOLD - DO NOT TAKE AGAIN UNTIL TOLD (Patient taking differently: Take 20 mg by mouth daily with supper. ) 30 tablet   . tamsulosin (FLOMAX) 0.4 MG CAPS capsule Take 0.4 mg by mouth daily with supper.    Marland Kitchen UNABLE TO FIND as needed. Naphcon a otc eye drops    . clonazePAM (KLONOPIN) 0.5 MG tablet Take 0.5 mg by mouth 2 (two) times daily as needed for anxiety.    Marland Kitchen aspirin 325 MG tablet Take 325 mg by mouth daily.    Marland Kitchen aspirin EC 81 MG tablet Take 81 mg by mouth every morning.    Marland Kitchen BIDIL 20-37.5 MG per tablet take 1 tablet by mouth twice a day 90 tablet 2  . clopidogrel (PLAVIX) 75 MG tablet Take 75 mg by mouth daily.    Marland Kitchen Naphazoline-Pheniramine (NAPHCON-A OP) Apply 1-2 drops to eye daily as needed (drye eyes).    Marland Kitchen  nicotine (NICODERM CQ - DOSED IN MG/24 HOURS) 21 mg/24hr patch Place 21 mg onto the skin daily.    Marland Kitchen oxyCODONE (OXY IR/ROXICODONE) 5 MG immediate release tablet Take 1-2 tablets (5-10 mg total) by mouth every 4 (four) hours as needed for moderate pain, severe pain or breakthrough pain. 40 tablet 0  . oxyCODONE-acetaminophen (PERCOCET/ROXICET) 5-325 MG per tablet Take 1-2 tablets by mouth every 4 (four) hours as needed for moderate pain. 30 tablet 0  . zolpidem (AMBIEN) 5 MG tablet Take 5 mg by mouth at bedtime as needed for sleep.      No facility-administered medications prior to visit.     Past Medical History  Diagnosis Date  . Hypertension   . Headache(784.0)   . 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   Marland Kitchen Compression fracture of thoracic vertebra 06/26/2011  . Fistula, bladder   . Colon polyps   . Diabetes mellitus type 2 in nonobese   . Cancer 2012    Prostate surgery  . Atrial fibrillation     one episode sept 2015  . Paroxysmal atrial fibrillation 05/19/2014  . Sepsis due to urinary tract infection 04/16/2014  . Urinary tract infection 04/14/2014  . History of adenomatous polyp of colon 06/14/2014  . Pneumonia     hx of  . PNA (pneumonia) 06/26/2011  . Anxiety   . Frequent urination at night   . Dry eyes   . Stroke sept 1, 2015    tia x 3     Past Surgical History  Procedure Laterality Date  . Robot assisted laparoscopic radical prostatectomy  01/31/2011    Robotic-assisted laparoscopic radical retropubic  . Lipoma excision  2012    Dr Nonah Mattes  . Prostatectomy  01/2011  . Colonoscopy N/A 06/14/2014    Procedure: COLONOSCOPY;  Surgeon: Gatha Mayer, MD;  Location: [facility];  Service: Endoscopy;  Laterality: N/A;  . Proctoscopy N/A 06/15/2014    Procedure: RIGID PROCTOSCOPY;  Surgeon: Michael Boston, MD;  Location: [facility];  Service: General;  Laterality: N/A;  . Cystoscopy with stent placement Bilateral 06/15/2014    Procedure: CYSTOSCOPY WITH BILATERAL STENT PLACEMENT;  Surgeon: Bernestine Amass, MD;  Location: [facility];  Service: Urology;  Laterality: Bilateral;  . Radiology with anesthesia N/A 07/28/2014    Procedure: Embolization;  Surgeon: Luanne Bras, MD;  Location: [facility];  Service: Radiology;  Laterality: N/A;  . Colon surgery       Family History  Problem Relation Age of Onset  . Atrial fibrillation Father     onset 25s. Had pacemaker placed for sinus pause  . Prostate cancer Father   . Breast cancer Mother   . Lung cancer Mother   . Colon cancer Neg Hx   . Leukemia Brother   . Colon polyps  Brother   . Diabetes Neg Hx      History   Social History  . Marital Status: Married    Spouse Name: N/A  . Number of Children: 2  . 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 Smoking status: Former Smoker -- 2.00 packs/day for 25 years    Types: Cigarettes    Quit date: 04/14/2014  . Smokeless tobacco: Never Used  . Alcohol Use: No  Comment: none since 04-14-2014  . Drug Use: No  . Sexual Activity: Not Currently   Other Topics Concern  . Not on file   Social History Narrative    Review of Systems  Constitutional: Negative for fever and chills.  Respiratory: Negative for cough, chest tightness, shortness of breath and wheezing.   Cardiovascular: Negative for chest pain, palpitations and leg swelling.  Neurological: Negative for headaches.      Objective:    BP 144/90 mmHg  Pulse 56  Temp(Src) 97.6 F (36.4 C) (Oral)  Resp 18  Ht 5\' 9"  (1.753 m)  Wt 191 lb 6.4 oz (86.818 kg)  BMI 28.25 kg/m2  SpO2 95% Nursing note and vital signs reviewed.  Physical Exam  Constitutional: He is oriented to person, place, and time. He appears well-developed and well-nourished. No distress.  Cardiovascular: Normal rate, regular rhythm, normal heart sounds and intact distal pulses.   Pulmonary/Chest: Effort normal and breath sounds normal.  Neurological: He is alert and oriented to person, place, and time.  Skin: Skin is warm and dry.  Psychiatric: He has a normal mood and affect. His behavior is normal. Judgment and thought content normal.       Assessment & Plan:   Problem List Items Addressed This Visit      Cardiovascular and Mediastinum   HTN (hypertension) - Primary    Blood pressure remains stable with current regimen and slightly above goal of 140/90. Continue current dosages of olmsartan, labetalol and diltiazem. Continue to monitor blood pressure at home. Additional management per  cardiology.       Paroxysmal atrial fibrillation    Stable and currently in a regular sinus rhythm via auscultation. Continue Xarelto at current dosage for TIA/CVA prophylaxes.        Endocrine   Diabetes mellitus type 2 in nonobese    Well controlled with current regimen. Last A1c 5.7. Start medication holiday and recheck A1c in  3 months to determine continued need.         Other   Personal history of prostate cancer s/p prostatectomy 2012    Stable with flomax. Questions if flomax is necessary. Start medication holiday of medication to determine any worsening of symptoms. If symptoms return, restart medication.

## 2015-03-02 NOTE — Assessment & Plan Note (Signed)
Blood pressure remains stable with current regimen and slightly above goal of 140/90. Continue current dosages of olmsartan, labetalol and diltiazem. Continue to monitor blood pressure at home. Additional management per cardiology.

## 2015-03-02 NOTE — Assessment & Plan Note (Addendum)
Well controlled with current regimen. Last A1c 5.7. Start medication holiday and recheck A1c in  3 months to determine continued need.

## 2015-03-02 NOTE — Assessment & Plan Note (Signed)
Stable and currently in a regular sinus rhythm via auscultation. Continue Xarelto at current dosage for TIA/CVA prophylaxes.

## 2015-03-25 ENCOUNTER — Ambulatory Visit (INDEPENDENT_AMBULATORY_CARE_PROVIDER_SITE_OTHER): Payer: BC Managed Care – PPO | Admitting: Cardiology

## 2015-03-25 ENCOUNTER — Encounter: Payer: Self-pay | Admitting: Cardiology

## 2015-03-25 VITALS — BP 186/96 | HR 64 | Ht 69.0 in | Wt 187.4 lb

## 2015-03-25 DIAGNOSIS — I10 Essential (primary) hypertension: Secondary | ICD-10-CM

## 2015-03-25 DIAGNOSIS — I480 Paroxysmal atrial fibrillation: Secondary | ICD-10-CM | POA: Diagnosis not present

## 2015-03-25 DIAGNOSIS — I5032 Chronic diastolic (congestive) heart failure: Secondary | ICD-10-CM

## 2015-03-25 NOTE — Patient Instructions (Signed)
Continue your current therapy  I will see you in 6 months.   

## 2015-03-25 NOTE — Progress Notes (Signed)
Douglas Ballard Date of Birth: [birth year] Medical Record [id number]  History of Present Illness: Douglas Ballard is seen today for follow up of atrial fibrillation. He has a  history of malignant HTN. He also has a history of tobacco and Etoh abuse, DM, and colovesical fistula. He was admitted last August 2015 with ARF, sepsis, expressive aphasia, acute diastolic heart failure and atrial fibrillation with RVR. He also has acute delirium related to Etoh withdrawal. He recovered from all this and was able to subsequently have his fistula repaired. Work up included an Echo which showed EF of 50-55% and moderate biatrial enlargement. Mild LVH. Myoview perfusion was normal. He has been on anticoagulation with Xarelto.  On follow up today he reports he is doing well. He  is active. He quit smoking. He reports drinking red wine in moderation. With smoking cessation he has gained about 18 lbs.  He denies any palpitations, chest pain, or SOB. He is on multiple BP meds and reports typical reading of Q000111Q systolic. He does note that since this event last year he is more emotional. He only notes swelling when he does a lot of travelling.     Medication List       This list is accurate as of: 03/25/15  5:07 PM.  Always use your most recent med list.               clonazePAM 0.5 MG tablet  Commonly known as:  KLONOPIN  Take 0.5 mg by mouth 2 (two) times daily as needed for anxiety.     diltiazem 240 MG 24 hr capsule  Commonly known as:  CARDIZEM CD  Take 240 mg by mouth every morning.     feeding supplement (GLUCERNA SHAKE) Liqd  Take 237 mLs by mouth 2 (two) times daily between meals.     isosorbide-hydrALAZINE 20-37.5 MG per tablet  Commonly known as:  BIDIL  Take 1 tablet by mouth 2 (two) times daily.     labetalol 300 MG tablet  Commonly known as:  NORMODYNE  Take 300 mg by mouth 3 (three) times daily.     multivitamins ther. w/minerals Tabs tablet  Take 1 tablet by mouth every morning.     olmesartan 40 MG tablet  Commonly known as:  BENICAR  Take 40 mg by mouth every morning.     rivaroxaban 20 MG Tabs tablet  Commonly known as:  XARELTO  Take 1 tablet (20 mg total) by mouth daily with supper. HOLD - DO NOT TAKE AGAIN UNTIL TOLD     UNABLE TO FIND  as needed. Naphcon a otc eye drops     zolpidem 5 MG tablet  Commonly known as:  AMBIEN  Take 1 tablet by mouth at bedtime. Take 1 tab daily at bedtime as needed         Allergies  Allergen Reactions  . Hydrochlorothiazide     Pt gets hyponatremia  . Lasix [Furosemide]     Sodium levels drop when take  . Nsaids     Kidney disease/failure  . Sulfa Antibiotics Other (See Comments)    headaches    Past Medical History  Diagnosis Date  . Hypertension   . Headache(784.0)   . 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   [facility] Kitchen Compression fracture of thoracic vertebra 06/26/2011  . Fistula, bladder   .  Colon polyps   . Diabetes mellitus type 2 in nonobese   . Cancer 2012    Prostate surgery  . Atrial fibrillation     one episode sept 2015  . Paroxysmal atrial fibrillation 05/19/2014  . Sepsis due to urinary tract infection 04/16/2014  . Urinary tract infection 04/14/2014  . History of adenomatous polyp of colon 06/14/2014  . Pneumonia     hx of  . PNA (pneumonia) 06/26/2011  . Anxiety   . Frequent urination at night   . Dry eyes   . Stroke sept 1, 2015    tia x 3    Past Surgical History  Procedure Laterality Date  . Robot assisted laparoscopic radical prostatectomy  01/31/2011    Robotic-assisted laparoscopic radical retropubic  . Lipoma excision  2012    Dr Nonah Mattes  . Prostatectomy  01/2011  . Colonoscopy N/A 06/14/2014    Procedure: COLONOSCOPY;  Surgeon: Gatha Mayer, MD;  Location: [facility];  Service: Endoscopy;  Laterality: N/A;  . Proctoscopy  N/A 06/15/2014    Procedure: RIGID PROCTOSCOPY;  Surgeon: Michael Boston, MD;  Location: [facility];  Service: General;  Laterality: N/A;  . Cystoscopy with stent placement Bilateral 06/15/2014    Procedure: CYSTOSCOPY WITH BILATERAL STENT PLACEMENT;  Surgeon: Bernestine Amass, MD;  Location: [facility];  Service: Urology;  Laterality: Bilateral;  . Radiology with anesthesia N/A 07/28/2014    Procedure: Embolization;  Surgeon: Luanne Bras, MD;  Location: [place];  Service: Radiology;  Laterality: N/A;  . Colon surgery      History   Social History  . Marital Status: Married    Spouse Name: N/A  . Number of Children: 2  . 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 Smoking status: Former Smoker -- 2.00 packs/day for 25 years    Types: Cigarettes    Quit date: 04/14/2014  . Smokeless tobacco: Never Used  . Alcohol Use: No     Comment: none since 04-14-2014  . Drug Use: No  . Sexual Activity: Not Currently   Other Topics Concern  . None   Social History Narrative    Family History  Problem Relation Age of Onset  . Atrial fibrillation Father     onset 63s. Had pacemaker placed for sinus pause  . Prostate cancer Father   . Breast cancer Mother   . Lung cancer Mother   . Colon cancer Neg Hx   . Leukemia Brother   . Colon polyps Brother   . Diabetes Neg Hx     Review of Systems: As noted in HPI.  All other systems were reviewed and are negative.  Physical Exam: BP 186/96 mmHg  Pulse 64  Ht 5\' 9"  (1.753 m)  Wt 85.021 kg (187 lb 7 oz)  BMI 27.67 kg/m2 Filed Weights   03/25/15 0928 03/25/15 0929  Weight: 85.021 kg (187 lb 7 oz) 85.021 kg (187 lb 7 oz)    LABORATORY DATA: Lab Results  Component Value Date   WBC 8.9 07/21/2014   HGB 10.8* 07/21/2014   HCT 31.5* 07/21/2014   PLT 304 07/21/2014   GLUCOSE 167* 07/28/2014   CHOL 81 04/22/2014   TRIG 52 04/22/2014   HDL 41 04/22/2014   LDLCALC 30  04/22/2014   ALT 13 07/21/2014   AST 14 07/21/2014   NA 139 07/28/2014   K 4.3 07/28/2014   CL 103  07/28/2014   CREATININE 1.35 07/28/2014   BUN 19 07/28/2014   CO2 22 07/28/2014   TSH 0.639 04/17/2014   INR 1.05 07/28/2014   HGBA1C 7.0* 06/10/2014    Assessment / Plan: 1. Paroxysmal AFib. Noted when admitted with sepsis. I think he is at moderate risk for recurrence with severe HTN and biatrial enlargement. For this reason I have recommended continued anticoagulation with Xarelto.  2. HTN- malignant. BP is elevated today. He is on high dose labetalol, clonidine, Bidil, and Benicar. He reports better blood pressure readings at home.   3. S/p repair colovesical fistula.  4. Tobacco abuse- now quit  5. Etoh abuse- reports moderate use now.   6. CKD followed by nephrology in [facility].   7. Chronic diastolic CHF- well compensated. Encourage sodium restriction.   Now that he has quit smoking needs to focus on improving weight.

## 2015-03-31 ENCOUNTER — Other Ambulatory Visit: Payer: Self-pay

## 2015-03-31 ENCOUNTER — Telehealth: Payer: Self-pay | Admitting: Family

## 2015-03-31 MED ORDER — ZOLPIDEM TARTRATE 5 MG PO TABS: 5.0000 mg | ORAL_TABLET | Freq: Every evening | ORAL | Status: DC | PRN

## 2015-03-31 NOTE — Telephone Encounter (Signed)
Error

## 2015-03-31 NOTE — Telephone Encounter (Signed)
pts wife called and [facility] has lost a shipment of pts ambien and they are going to fax a re-authorization for this. [facility] also asked to send in a 10 day supply to [facility] on [address]

## 2015-03-31 NOTE — Telephone Encounter (Signed)
Medication sent to [facility] - awaiting re-authorization form.

## 2015-04-29 ENCOUNTER — Other Ambulatory Visit: Payer: Self-pay | Admitting: Physician Assistant

## 2015-05-26 ENCOUNTER — Other Ambulatory Visit: Payer: Self-pay | Admitting: Physician Assistant

## 2015-06-17 ENCOUNTER — Other Ambulatory Visit: Payer: Self-pay

## 2015-06-17 MED ORDER — DILTIAZEM HCL ER COATED BEADS 240 MG PO CP24: 240.0000 mg | ORAL_CAPSULE | ORAL | Status: DC

## 2015-06-30 ENCOUNTER — Telehealth: Payer: Self-pay

## 2015-06-30 MED ORDER — RIVAROXABAN 20 MG PO TABS: 20.0000 mg | ORAL_TABLET | Freq: Every day | ORAL | Status: DC

## 2015-06-30 NOTE — Telephone Encounter (Signed)
Pt is requesting a med refill of xarelto 20 mg. The Dr that has been prescribing it is Dr. Carlean Purl. Did you want me to request to pt that he refill it again or are you ok with refilling? Please advise

## 2015-06-30 NOTE — Telephone Encounter (Signed)
Medication reordered.

## 2015-07-01 NOTE — Telephone Encounter (Signed)
Pt aware.

## 2015-07-03 ENCOUNTER — Other Ambulatory Visit: Payer: Self-pay | Admitting: Cardiology

## 2015-07-04 ENCOUNTER — Telehealth: Payer: Self-pay | Admitting: Family

## 2015-07-04 MED ORDER — RIVAROXABAN 20 MG PO TABS: 20.0000 mg | ORAL_TABLET | Freq: Every day | ORAL | Status: DC

## 2015-07-04 NOTE — Telephone Encounter (Signed)
Patient is requesting a temporary script for rivaroxaban (XARELTO) 20 MG TABS tablet [id number]  Send to [facility] on [address].

## 2015-07-04 NOTE — Telephone Encounter (Signed)
Rx sent. Pt aware.  

## 2015-08-01 ENCOUNTER — Other Ambulatory Visit: Payer: Self-pay | Admitting: Family

## 2015-08-04 ENCOUNTER — Telehealth (HOSPITAL_COMMUNITY): Payer: Self-pay

## 2015-08-04 NOTE — Telephone Encounter (Signed)
Called to schedule f/u MRI. Pt stated that he wasn't feeling well right now and does not want to schedule f/u. I told him that I would check back with him later next month to see if he wants to schedule. Pt agreed with this plan. AW

## 2015-08-26 ENCOUNTER — Other Ambulatory Visit: Payer: Self-pay | Admitting: Family

## 2015-08-26 ENCOUNTER — Other Ambulatory Visit: Payer: Self-pay

## 2015-08-26 MED ORDER — ZOLPIDEM TARTRATE 5 MG PO TABS: 5.0000 mg | ORAL_TABLET | Freq: Every evening | ORAL | Status: DC | PRN

## 2015-08-26 MED ORDER — OLMESARTAN MEDOXOMIL 40 MG PO TABS: 40.0000 mg | ORAL_TABLET | ORAL | Status: DC

## 2015-09-05 ENCOUNTER — Other Ambulatory Visit: Payer: Self-pay

## 2015-09-05 ENCOUNTER — Telehealth: Payer: Self-pay | Admitting: Family

## 2015-09-05 MED ORDER — ZOLPIDEM TARTRATE 5 MG PO TABS: 5.0000 mg | ORAL_TABLET | Freq: Every evening | ORAL | Status: DC | PRN

## 2015-09-05 MED ORDER — RIVAROXABAN 20 MG PO TABS: ORAL_TABLET | ORAL | Status: DC

## 2015-09-05 MED ORDER — OLMESARTAN MEDOXOMIL 40 MG PO TABS: 40.0000 mg | ORAL_TABLET | ORAL | Status: DC

## 2015-09-05 NOTE — Telephone Encounter (Signed)
Rx sent called to let him know. Wife answered and is aware.

## 2015-09-05 NOTE — Telephone Encounter (Signed)
Pt called stated that XARELTO 20 MG TABS tablet need to be send into [facility]. Please help, this was suppose to be done 2 week ago. Please check and call pt back.

## 2015-09-08 ENCOUNTER — Other Ambulatory Visit: Payer: Self-pay

## 2015-09-08 MED ORDER — ZOLPIDEM TARTRATE 5 MG PO TABS: 5.0000 mg | ORAL_TABLET | Freq: Every evening | ORAL | Status: DC | PRN

## 2015-09-19 ENCOUNTER — Encounter: Payer: Self-pay | Admitting: Family

## 2015-09-19 ENCOUNTER — Telehealth: Payer: Self-pay

## 2015-09-19 ENCOUNTER — Ambulatory Visit (INDEPENDENT_AMBULATORY_CARE_PROVIDER_SITE_OTHER): Payer: BC Managed Care – PPO | Admitting: Family

## 2015-09-19 ENCOUNTER — Other Ambulatory Visit (INDEPENDENT_AMBULATORY_CARE_PROVIDER_SITE_OTHER): Payer: BC Managed Care – PPO

## 2015-09-19 VITALS — BP 170/86 | HR 63 | Temp 98.1°F | Resp 16 | Ht 69.0 in | Wt 188.0 lb

## 2015-09-19 DIAGNOSIS — G252 Other specified forms of tremor: Secondary | ICD-10-CM | POA: Diagnosis not present

## 2015-09-19 DIAGNOSIS — I10 Essential (primary) hypertension: Secondary | ICD-10-CM

## 2015-09-19 DIAGNOSIS — G4700 Insomnia, unspecified: Secondary | ICD-10-CM

## 2015-09-19 DIAGNOSIS — I480 Paroxysmal atrial fibrillation: Secondary | ICD-10-CM

## 2015-09-19 LAB — CBC
HCT: 50.1 % (ref 39.0–52.0)
Hemoglobin: 16.8 g/dL (ref 13.0–17.0)
MCHC: 33.6 g/dL (ref 30.0–36.0)
MCV: 103.5 fl — ABNORMAL HIGH (ref 78.0–100.0)
Platelets: 227 10*3/uL (ref 150.0–400.0)
RBC: 4.84 Mil/uL (ref 4.22–5.81)
RDW: 14.9 % (ref 11.5–15.5)
WBC: 9.4 10*3/uL (ref 4.0–10.5)

## 2015-09-19 LAB — COMPREHENSIVE METABOLIC PANEL
ALBUMIN: 3.8 g/dL (ref 3.5–5.2)
ALT: 10 U/L (ref 0–53)
AST: 12 U/L (ref 0–37)
Alkaline Phosphatase: 75 U/L (ref 39–117)
BILIRUBIN TOTAL: 1 mg/dL (ref 0.2–1.2)
BUN: 13 mg/dL (ref 6–23)
CALCIUM: 8.8 mg/dL (ref 8.4–10.5)
CO2: 27 mEq/L (ref 19–32)
Chloride: 94 mEq/L — ABNORMAL LOW (ref 96–112)
Creatinine, Ser: 1.49 mg/dL (ref 0.40–1.50)
GFR: 50.45 mL/min — ABNORMAL LOW (ref >60.00)
Glucose, Bld: 325 mg/dL — ABNORMAL HIGH (ref 70–99)
Potassium: 5.1 mEq/L (ref 3.5–5.1)
Sodium: 130 mEq/L — ABNORMAL LOW (ref 135–145)
Total Protein: 6.9 g/dL (ref 6.0–8.3)

## 2015-09-19 LAB — HEMOGLOBIN A1C: HEMOGLOBIN A1C: 7.6 % — AB (ref 4.6–6.5)

## 2015-09-19 MED ORDER — ZOLPIDEM TARTRATE 5 MG PO TABS: 5.0000 mg | ORAL_TABLET | Freq: Every evening | ORAL | Status: DC | PRN

## 2015-09-19 MED ORDER — LABETALOL HCL 300 MG PO TABS: 300.0000 mg | ORAL_TABLET | Freq: Three times a day (TID) | ORAL | Status: DC

## 2015-09-19 MED ORDER — DILTIAZEM HCL ER COATED BEADS 240 MG PO CP24: 240.0000 mg | ORAL_CAPSULE | Freq: Every morning | ORAL | Status: DC

## 2015-09-19 MED ORDER — OLMESARTAN MEDOXOMIL 40 MG PO TABS: 40.0000 mg | ORAL_TABLET | ORAL | Status: DC

## 2015-09-19 NOTE — Assessment & Plan Note (Signed)
Blood pressure remains above goal 140/90 with current regimen. Patient indicates she takes medication as prescribed with concern for compliance. Continue current dosage of losartan, labetalol, isosorbide-hydralazine, and diltiazem. Continue to monitor blood pressure at home. Discussed importance of reducing alcohol intake to help blood pressure as well as overall health. Follow-up with cardiology. Blood pressure remains elevated, consider additional medications.

## 2015-09-19 NOTE — Patient Instructions (Signed)
Thank you for choosing [facility].  Summary/Instructions:  Continue to take your medications as prescribed.  Follow up with cardiology.  Limit your alcohol consumption.  Your prescription(s) have been submitted to your pharmacy or been printed and provided for you. Please take as directed and contact our office if you believe you are having problem(s) with the medication(s) or have any questions.  Please stop by the lab on the basement level of the building for your blood work. Your results will be released to [place] (or called to you) after review, usually within 72 hours after test completion. If any changes need to be made, you will be notified at that same time.  Referrals have been made during this visit. You should expect to hear back from our schedulers in about 7-10 days in regards to establishing an appointment with the specialists we discussed.   If your symptoms worsen or fail to improve, please contact our office for further instruction, or in case of emergency go directly to the emergency room at the closest medical facility.

## 2015-09-19 NOTE — Assessment & Plan Note (Signed)
Paroxysmal atrial fibrillation anticoagulated with Xarelto. There is concern for alcohol consumption and use of Xarelto. Indicates he conusmes "2 glass" of wine per night with specific description of size of glasses provided. Discussed risk of increased bleeding with combination of Xarleto and alcohol consumption. Continue to monitor at this time. May require discontinuation of the Xarelto.

## 2015-09-19 NOTE — Progress Notes (Signed)
Subjective:    Patient ID: Douglas Ballard, male    DOB: [birth year], 65 y.o.   MRN: [id number]  Chief Complaint  Patient presents with  . Medication Follow up    Refill of ambien,diliazem, labetalol, xarelto, and benicar    HPI:  Douglas Ballard is a 65 y.o. male who  has a past medical history of Hypertension; Headache(784.0); Alcohol withdrawal ([place]) (06/27/2011); Alcohol withdrawal seizure ([place]) (06/28/2011); Compression fracture of thoracic vertebra ([facility]) (06/26/2011); Fistula, bladder; Colon polyps; Diabetes mellitus type 2 in nonobese ([place]); Cancer ([place]) (2012); Atrial fibrillation ([place]); Paroxysmal atrial fibrillation ([place]) (05/19/2014); Sepsis due to urinary tract infection ([place]) (04/16/2014); Urinary tract infection (04/14/2014); History of adenomatous polyp of colon (06/14/2014); Pneumonia; PNA (pneumonia) (06/26/2011); Anxiety; Frequent urination at night; Dry eyes; and Stroke ([place]) (sept 1, 2015). and presents today for a follow up office visit.   1.) Hypertension - Currently maintained on olmsartan, labetalol, isosorbide-hydralazine, and diltiazem. Reports taking the medication as prescribed and denies adverse side effects or hypotensive readings. Blood pressure at home has been averaging around the Q000111Q systolic. Denies symptoms of end organ damage.  BP Readings from Last 3 Encounters:  09/19/15 170/86  03/25/15 186/96  03/02/15 144/90    2.) Insomnia - Currently maintained on Ambien. Takes the medication as prescribed and denies adverse side effects. Averaging about 2 hours of sleep per night.  3.) Atrial fibrillation - Currently anticoagulated on Xarelto. Takes the medication as prescribed. Does drink approximately to 2 drinks of red wine per night.   4.) Hand tremors - Associated symptom of a tremor located in his bilateral hands has been going on for about 1 month. Tremor is described as both at rest and with activity. Since he first noted it he indicates it has been about the  same with any worsening. Denies any cogwheel motions or bradykinesia.   Allergies  Allergen Reactions  . Hydrochlorothiazide     Pt gets hyponatremia  . Lasix [Furosemide]     Sodium levels drop when take  . Nsaids     Kidney disease/failure  . Sulfa Antibiotics Other (See Comments)    headaches     Current Outpatient Prescriptions on File Prior to Visit  Medication Sig Dispense Refill  . BIDIL 20-37.5 MG tablet take 1 tablet by mouth twice a day 180 tablet 2  . clonazePAM (KLONOPIN) 0.5 MG tablet Take 0.5 mg by mouth 2 (two) times daily as needed for anxiety.    . feeding supplement, GLUCERNA SHAKE, (GLUCERNA SHAKE) LIQD Take 237 mLs by mouth 2 (two) times daily between meals.    . isosorbide-hydrALAZINE (BIDIL) 20-37.5 MG per tablet Take 1 tablet by mouth 2 (two) times daily.    . Multiple Vitamins-Minerals (MULTIVITAMINS THER. W/MINERALS) TABS Take 1 tablet by mouth every morning.     . rivaroxaban (XARELTO) 20 MG TABS tablet take 1 tablet by mouth once daily WITH SUPPER 30 tablet 0  . UNABLE TO FIND as needed. Naphcon a otc eye drops     No current facility-administered medications on file prior to visit.     Past Surgical History  Procedure Laterality Date  . Robot assisted laparoscopic radical prostatectomy  01/31/2011    Robotic-assisted laparoscopic radical retropubic  . Lipoma excision  2012    Dr Nonah Mattes  . Prostatectomy  01/2011  . Colonoscopy N/A 06/14/2014    Procedure: COLONOSCOPY;  Surgeon: Gatha Mayer, MD;  [facility];  Service: Endoscopy;  Laterality: N/A;  . Proctoscopy  N/A 06/15/2014    Procedure: RIGID PROCTOSCOPY;  Surgeon: Michael Boston, MD;  Location: [facility];  Service: General;  Laterality: N/A;  . Cystoscopy with stent placement Bilateral 06/15/2014    Procedure: CYSTOSCOPY WITH BILATERAL STENT PLACEMENT;  Surgeon: Bernestine Amass, MD;  Location: [facility];  Service: Urology;  Laterality: Bilateral;  . Radiology with anesthesia N/A  07/28/2014    Procedure: Embolization;  Surgeon: Luanne Bras, MD;  Location: [place];  Service: Radiology;  Laterality: N/A;  . Colon surgery      Review of Systems  Respiratory: Negative for chest tightness and shortness of breath.   Cardiovascular: Negative for chest pain, palpitations and leg swelling.  Neurological: Positive for tremors. Negative for dizziness, light-headedness and headaches.  Psychiatric/Behavioral: Positive for sleep disturbance.      Objective:    BP 170/86 mmHg  Pulse 63  Temp(Src) 98.1 F (36.7 C) (Oral)  Resp 16  Ht 5\' 9"  (1.753 m)  Wt 188 lb (85.276 kg)  BMI 27.75 kg/m2  SpO2 96% Nursing note and vital signs reviewed.  Physical Exam  Constitutional: He is oriented to person, place, and time. He appears well-developed and well-nourished. No distress.  Cardiovascular: Normal rate, regular rhythm, normal heart sounds and intact distal pulses.   Pulmonary/Chest: Effort normal and breath sounds normal.  Neurological: He is alert and oriented to person, place, and time.  Mild coarse tremor at rest and with activity. No bradykinesia or cogwheel movement noted.   Skin: Skin is warm and dry.  Psychiatric: He has a normal mood and affect. His behavior is normal. Judgment and thought content normal.       Assessment & Plan:   Problem List Items Addressed This Visit      Cardiovascular and Mediastinum   HTN (hypertension) - Primary    Blood pressure remains above goal 140/90 with current regimen. Patient indicates she takes medication as prescribed with concern for compliance. Continue current dosage of losartan, labetalol, isosorbide-hydralazine, and diltiazem. Continue to monitor blood pressure at home. Discussed importance of reducing alcohol intake to help blood pressure as well as overall health. Follow-up with cardiology. Blood pressure remains elevated, consider additional medications.      Relevant Medications   diltiazem (CARTIA XT) 240 MG 24  hr capsule   labetalol (NORMODYNE) 300 MG tablet   olmesartan (BENICAR) 40 MG tablet   Other Relevant Orders   Comprehensive metabolic panel (Completed)   Hemoglobin A1c (Completed)   CBC (Completed)   Paroxysmal atrial fibrillation (HCC)    Paroxysmal atrial fibrillation anticoagulated with Xarelto. There is concern for alcohol consumption and use of Xarelto. Indicates he conusmes "2 glass" of wine per night with specific description of size of glasses provided. Discussed risk of increased bleeding with combination of Xarleto and alcohol consumption. Continue to monitor at this time. May require discontinuation of the Xarelto.       Relevant Medications   diltiazem (CARTIA XT) 240 MG 24 hr capsule   labetalol (NORMODYNE) 300 MG tablet   olmesartan (BENICAR) 40 MG tablet     Other   Insomnia    Stable with current dosage of Ambien. Denies adverse side effects. Continue current dosage of Ambien.       Relevant Medications   zolpidem (AMBIEN) 5 MG tablet   Coarse tremors    Symptoms and of course tremors noted. Question possible relation to alcohol consumption. Refer to neurology for further assessment. Follow-up if symptoms worsen prior to referral. Obtain CBC, comprehensive  metabolic panel, and [id number].      Relevant Orders   Ambulatory referral to Neurology

## 2015-09-19 NOTE — Progress Notes (Signed)
Pre visit review using our clinic review tool, if applicable. No additional management support is needed unless otherwise documented below in the visit note. 

## 2015-09-19 NOTE — Assessment & Plan Note (Signed)
Stable with current dosage of Ambien. Denies adverse side effects. Continue current dosage of Ambien.

## 2015-09-19 NOTE — Telephone Encounter (Signed)
PA initiated for Ambien 5 mg BIN [id number] GRP [id number] ID [id number] [place] [id number]

## 2015-09-19 NOTE — Assessment & Plan Note (Signed)
Symptoms and of course tremors noted. Question possible relation to alcohol consumption. Refer to neurology for further assessment. Follow-up if symptoms worsen prior to referral. Obtain CBC, comprehensive metabolic panel, and [id number].

## 2015-09-21 ENCOUNTER — Other Ambulatory Visit: Payer: Self-pay | Admitting: Family

## 2015-09-21 ENCOUNTER — Encounter: Payer: Self-pay | Admitting: Family

## 2015-09-21 MED ORDER — SITAGLIPTIN PHOSPHATE 100 MG PO TABS: 100.0000 mg | ORAL_TABLET | Freq: Every day | ORAL | Status: DC

## 2015-09-21 NOTE — Telephone Encounter (Signed)
Received notification that PA is not required. Pt advised of same via personal VM

## 2015-09-27 ENCOUNTER — Other Ambulatory Visit: Payer: Self-pay | Admitting: Physician Assistant

## 2015-09-30 ENCOUNTER — Telehealth: Payer: Self-pay | Admitting: Neurology

## 2015-09-30 ENCOUNTER — Ambulatory Visit (INDEPENDENT_AMBULATORY_CARE_PROVIDER_SITE_OTHER): Payer: BC Managed Care – PPO | Admitting: Neurology

## 2015-09-30 ENCOUNTER — Other Ambulatory Visit (INDEPENDENT_AMBULATORY_CARE_PROVIDER_SITE_OTHER): Payer: BC Managed Care – PPO

## 2015-09-30 ENCOUNTER — Encounter: Payer: Self-pay | Admitting: Neurology

## 2015-09-30 VITALS — BP 110/70 | HR 62 | Ht 69.0 in | Wt 181.0 lb

## 2015-09-30 DIAGNOSIS — R251 Tremor, unspecified: Secondary | ICD-10-CM

## 2015-09-30 DIAGNOSIS — F1099 Alcohol use, unspecified with unspecified alcohol-induced disorder: Secondary | ICD-10-CM

## 2015-09-30 DIAGNOSIS — IMO0002 Reserved for concepts with insufficient information to code with codable children: Secondary | ICD-10-CM

## 2015-09-30 LAB — TSH: TSH: 1.25 u[IU]/mL (ref 0.35–4.50)

## 2015-09-30 NOTE — Progress Notes (Signed)
Subjective:   Douglas Ballard was seen in consultation in the movement disorder clinic at the request of Mauricio Po, [place].  The evaluation is for tremor.  I have reviewed prior records made available to me.  Pt is R hand dominant.  The patient reports hand tremor for about 6 months-1 year.   Pt reports that his wife is the one that noted the tremor.  He thinks that it is in both hands but he hardly notices it.   The patient does have a history of alcohol abuse, with a history of alcohol withdrawal seizure in approximately 2012.  The patient continues to drink alcohol.  He has family history of tremor in his father with PD and his uncles (2) who had PD as well.  Affected by caffeine:  unknown  (has decreased it over a year to 4 cups a week; tea for lunch; rare soda) Affected by alcohol:  Unknown (splits bottle with wife per week; cocktail every other night) Affected by stress:  Yes.   (under work stress) Affected by fatigue:  No. Spills soup if on spoon: may or may not Spills glass of liquid if full:  No. Affects ADL's (tying shoes, brushing teeth, etc):  Yes.    Current/Previously tried tremor medications: on klonopin, 1 tablet in AM but not for tremo  Current medications that may exacerbate tremor:  n/a  Outside reports reviewed: historical medical records, lab reports and office notes.  Allergies  Allergen Reactions  . Hydrochlorothiazide     Pt gets hyponatremia  . Lasix [Furosemide]     Sodium levels drop when take  . Nsaids     Kidney disease/failure  . Sulfa Antibiotics Other (See Comments)    headaches    Outpatient Encounter Prescriptions as of 09/30/2015  Medication Sig  . BIDIL 20-37.5 MG tablet take 1 tablet by mouth twice a day  . clonazePAM (KLONOPIN) 0.5 MG tablet Take 0.5 mg by mouth 2 (two) times daily as needed for anxiety.  Marland Kitchen diltiazem (CARTIA XT) 240 MG 24 hr capsule Take 1 capsule (240 mg total) by mouth every morning.  . feeding supplement, GLUCERNA  SHAKE, (GLUCERNA SHAKE) LIQD Take 237 mLs by mouth 2 (two) times daily between meals.  . isosorbide-hydrALAZINE (BIDIL) 20-37.5 MG per tablet Take 1 tablet by mouth 2 (two) times daily.  Marland Kitchen labetalol (NORMODYNE) 300 MG tablet Take 1 tablet (300 mg total) by mouth 3 (three) times daily.  . Multiple Vitamins-Minerals (MULTIVITAMINS THER. W/MINERALS) TABS Take 1 tablet by mouth every morning.   . olmesartan (BENICAR) 40 MG tablet Take 1 tablet (40 mg total) by mouth every morning.  . rivaroxaban (XARELTO) 20 MG TABS tablet take 1 tablet by mouth once daily WITH SUPPER  . sitaGLIPtin (JANUVIA) 100 MG tablet Take 1 tablet (100 mg total) by mouth daily.  Marland Kitchen UNABLE TO FIND as needed. Naphcon a otc eye drops  . zolpidem (AMBIEN) 5 MG tablet Take 1 tablet (5 mg total) by mouth at bedtime as needed for sleep.  . Vitamin D, Ergocalciferol, (DRISDOL) 50000 units CAPS capsule Take 50,000 Units by mouth once a week.   No facility-administered encounter medications on file as of 09/30/2015.    Past Medical History  Diagnosis Date  . Hypertension   . Headache(784.0)   . Alcohol withdrawal ([place]) 06/27/2011  . Alcohol withdrawal seizure ([place]) 06/28/2011    Alcohol withdraw seizure prior to admission is suspected from history given by family & Post ictal appearance in the  ED.   . Compression fracture of thoracic vertebra ([place]) 06/26/2011  . Fistula, bladder   . Colon polyps   . Diabetes mellitus type 2 in nonobese (HCC)   . Cancer [facility]) 2012    Prostate surgery  . Atrial fibrillation ([place])     one episode sept 2015  . Paroxysmal atrial fibrillation ([place]) 05/19/2014  . Sepsis due to urinary tract infection ([place]) 04/16/2014  . Urinary tract infection 04/14/2014  . History of adenomatous polyp of colon 06/14/2014  . Pneumonia     hx of  . PNA (pneumonia) 06/26/2011  . Anxiety   . Frequent urination at night   . Dry eyes   . Stroke ([place]) sept 1, 2015    tia x 3    Past Surgical History  Procedure  Laterality Date  . Robot assisted laparoscopic radical prostatectomy  01/31/2011    Robotic-assisted laparoscopic radical retropubic  . Lipoma excision  2012    Dr Nonah Mattes  . Colonoscopy N/A 06/14/2014    Procedure: COLONOSCOPY;  Surgeon: Gatha Mayer, MD;  [facility];  Service: Endoscopy;  Laterality: N/A;  . Proctoscopy N/A 06/15/2014    Procedure: RIGID PROCTOSCOPY;  Surgeon: Michael Boston, MD;  Location: [facility];  Service: General;  Laterality: N/A;  . Cystoscopy with stent placement Bilateral 06/15/2014    Procedure: CYSTOSCOPY WITH BILATERAL STENT PLACEMENT;  Surgeon: Bernestine Amass, MD;  Location: [facility];  Service: Urology;  Laterality: Bilateral;  . Radiology with anesthesia N/A 07/28/2014    Procedure: Embolization;  Surgeon: Luanne Bras, MD;  Location: [place];  Service: Radiology;  Laterality: N/A;  . Colon surgery      Social History   Social History  . Marital Status: Married    Spouse Name: N/A  . Number of Children: 2  . 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 Smoking status: Former Smoker -- 2.00 packs/day for 25 years    Types: Cigarettes    Quit date: 04/14/2014  . Smokeless tobacco: Never Used  . Alcohol Use: 4.2 - 6.0 oz/week    7-10 Standard drinks or equivalent per week     Comment: 2 bottles of wine with wife a week; cocktail every other night  . Drug Use: No  . Sexual Activity: Not Currently   Other Topics Concern  . Not on file   Social History Narrative    Family Status  Relation Status Death Age  . Father Deceased     prostate cancer, PD  . Mother Deceased     metastatic lung CA  . Paternal Uncle Deceased     PD in two  . Brother Alive     leukemia  . Sister Alive     healthy  . Child Alive     2, healthy    Review of Systems A complete 10 system ROS was obtained and was negative apart from what is mentioned.   Objective:   VITALS:     Filed Vitals:   09/30/15 0952  BP: 110/70  Pulse: 62  Height: 5\' 9"  (1.753 m)  Weight: 181 lb (82.101 kg)   Gen:  Appears stated age and in NAD. HEENT:  Normocephalic, atraumatic. The mucous membranes are moist. The superficial temporal arteries are without ropiness or tenderness. Cardiovascular: Regular rate and rhythm. Lungs: Clear to auscultation bilaterally. Neck: There are no carotid bruits noted bilaterally.  NEUROLOGICAL:  Orientation:  The patient is alert and oriented x 3.  Recent and remote memory are intact.  Attention span and concentration are normal.  Able to name objects and repeat without trouble.  Fund of knowledge is appropriate Cranial nerves: There is good facial symmetry. The pupils are equal round and reactive to light bilaterally. Fundoscopic exam reveals clear disc margins bilaterally. Extraocular muscles are intact and visual fields are full to confrontational testing. Speech is fluent and clear. Soft palate rises symmetrically and there is no tongue deviation. Hearing is intact to conversational tone. Tone: Tone is good throughout. Sensation: Sensation is intact to light touch and pinprick throughout (facial, trunk, extremities).  He does not report decreased pinprick in a stocking distribution.  Vibration is decreased at the bilateral big toe. There is no extinction with double simultaneous stimulation. There is no sensory dermatomal level identified. Coordination:  The patient has no dysdiadichokinesia or dysmetria. Motor: Strength is 5/5 in the bilateral upper and lower extremities.  Shoulder shrug is equal bilaterally.  There is no pronator drift.  There are no fasciculations noted. DTR's: Deep tendon reflexes are 2/4 at the bilateral biceps, triceps, brachioradialis, patella and achilles.  Plantar responses are downgoing bilaterally. Gait and Station: The patient is able to ambulate without difficulty. The patient is able to heel toe walk without any difficulty.  The patient is able to ambulate in a tandem fashion. The patient is able to stand in the Romberg position.   MOVEMENT EXAM: Tremor:  There is mild tremor in the UE, noted most significantly with action.  Tremor is evident with archimedes spirals as well.  He spills water when asked to pour from one glass to another, but not a lot.  Minimal quiver of the L hand at rest.    LABS    Chemistry      Component Value Date/Time   NA 130* 09/19/2015 1221   K 5.1 09/19/2015 1221   CL 94* 09/19/2015 1221   CO2 27 09/19/2015 1221   BUN 13 09/19/2015 1221   CREATININE 1.49 09/19/2015 1221      Component Value Date/Time   CALCIUM 8.8 09/19/2015 1221   ALKPHOS 75 09/19/2015 1221   AST 12 09/19/2015 1221   ALT 10 09/19/2015 1221   BILITOT 1.0 09/19/2015 1221     No results found for: VITAMINB12 Lab Results  Component Value Date   TSH 0.639 04/17/2014   Lab Results  Component Value Date   HGBA1C 7.6* 09/19/2015      Assessment/Plan:   1. Tremor.  -While I cannot fully exclude essential tremor, I suspect that this is tremor is a secondary tremor, perhaps secondary to alcohol and perhaps also hyperglycemia.  He and I talked about the importance of weaning and ultimately discontinuing alcohol altogether.  We also talked about the importance of blood sugar control and proper diet.  He is not taking the medication for diabetes that was recently prescribed.  I do not want to add further medication to his current regimen and he really does not want a medication either.  I see no evidence of a neurodegenerative process such as Parkinson's disease.  -I will do a TSH, as that has not been done.  -pt education provided 2.  F/u prn.  Much greater than 50% of this visit was spent in counseling with the patient.  Total face to face time:  60 min

## 2015-09-30 NOTE — Patient Instructions (Signed)
1. Your provider has requested that you have labwork completed today. Please go to [facility] ([id number]) on the second floor of this building before leaving the office today. You do not need to check in. If you are not called within 15 minutes please check with the front desk.   

## 2015-09-30 NOTE — Telephone Encounter (Signed)
-----   Message from [place], DO sent at 09/30/2015  1:37 PM EST ----- Let pt know that his TSH is normal.  Can send him mychart message

## 2015-10-04 ENCOUNTER — Telehealth (HOSPITAL_COMMUNITY): Payer: Self-pay

## 2015-10-04 NOTE — Telephone Encounter (Signed)
Called to schedule f/u MRI with Dr. Estanislado Pandy. Pt stated that he doesn't understand why he needs to do these exams so often. He stated that he is feeling fine and would like to wait another year before going through with a MRI again. I told pt that I would let Dr. Estanislado Pandy know and to give [place] a call if he starts to notice any new changes or symptoms. Pt agreed with this plan. AW

## 2015-10-27 ENCOUNTER — Other Ambulatory Visit: Payer: Self-pay | Admitting: Physician Assistant

## 2015-11-10 ENCOUNTER — Other Ambulatory Visit: Payer: Self-pay | Admitting: Physician Assistant

## 2015-11-24 ENCOUNTER — Other Ambulatory Visit: Payer: Self-pay | Admitting: Physician Assistant

## 2015-12-11 ENCOUNTER — Other Ambulatory Visit: Payer: Self-pay | Admitting: Family

## 2015-12-18 ENCOUNTER — Other Ambulatory Visit: Payer: Self-pay | Admitting: Family

## 2015-12-22 ENCOUNTER — Other Ambulatory Visit: Payer: Self-pay | Admitting: Family

## 2016-01-20 ENCOUNTER — Ambulatory Visit (INDEPENDENT_AMBULATORY_CARE_PROVIDER_SITE_OTHER): Payer: BC Managed Care – PPO | Admitting: Cardiology

## 2016-01-20 ENCOUNTER — Encounter: Payer: Self-pay | Admitting: Cardiology

## 2016-01-20 VITALS — BP 126/80 | HR 85 | Ht 69.0 in | Wt 188.0 lb

## 2016-01-20 DIAGNOSIS — E119 Type 2 diabetes mellitus without complications: Secondary | ICD-10-CM

## 2016-01-20 DIAGNOSIS — I10 Essential (primary) hypertension: Secondary | ICD-10-CM | POA: Diagnosis not present

## 2016-01-20 DIAGNOSIS — I480 Paroxysmal atrial fibrillation: Secondary | ICD-10-CM

## 2016-01-20 DIAGNOSIS — I5032 Chronic diastolic (congestive) heart failure: Secondary | ICD-10-CM

## 2016-01-20 DIAGNOSIS — Z720 Tobacco use: Secondary | ICD-10-CM

## 2016-01-20 NOTE — Progress Notes (Signed)
[name] Date of Birth: May 25, 1951 Medical Record [id number]  History of Present Illness: Mr. Alcala is seen today for follow up of atrial fibrillation. He has a  history of malignant HTN. He also has a history of tobacco and Etoh abuse, DM, and colovesical fistula. He was admitted last August 2015 with ARF, sepsis, expressive aphasia, acute diastolic heart failure and atrial fibrillation with RVR. He also has acute delirium related to Etoh withdrawal. He recovered from all this and was able to subsequently have his fistula repaired. Work up included an Echo which showed EF of 50-55% and moderate biatrial enlargement. Mild LVH. Myoview perfusion was normal. He has been on anticoagulation with Xarelto.  On follow up today he reports he is doing well. He  is active. He quit smoking.   He denies any palpitations, chest pain, or SOB. No dizziness. He is tolerating his medications well. He is seeing a nephrologist for CKD.  He only notes swelling when he does a lot of travelling.     Medication List       This list is accurate as of: 01/20/16 10:02 AM.  Always use your most recent med list.               clonazePAM 0.5 MG tablet  Commonly known as:  KLONOPIN  Take 0.5 mg by mouth 2 (two) times daily as needed for anxiety.     diltiazem 240 MG 24 hr capsule  Commonly known as:  CARDIZEM CD  TAKE 1 CAPSULE EVERY       MORNING     feeding supplement (GLUCERNA SHAKE) Liqd  Take 237 mLs by mouth 2 (two) times daily between meals.     isosorbide-hydrALAZINE 20-37.5 MG tablet  Commonly known as:  BIDIL  Take 1 tablet by mouth 2 (two) times daily.     BIDIL 20-37.5 MG tablet  Generic drug:  isosorbide-hydrALAZINE  take 1 tablet by mouth twice a day     JANUVIA 100 MG tablet  Generic drug:  sitaGLIPtin  TAKE 1 TABLET DAILY     labetalol 300 MG tablet  Commonly known as:  NORMODYNE  Take 1 tablet (300 mg total) by mouth 3 (three) times daily.     multivitamins ther. w/minerals  Tabs tablet  Take 1 tablet by mouth every morning.     olmesartan 40 MG tablet  Commonly known as:  BENICAR  TAKE 1 TABLET EVERY MORNING     rivaroxaban 20 MG Tabs tablet  Commonly known as:  XARELTO  take 1 tablet by mouth once daily WITH SUPPER     UNABLE TO FIND  as needed. Naphcon a otc eye drops     Vitamin D (Ergocalciferol) 50000 units Caps capsule  Commonly known as:  DRISDOL  Take 50,000 Units by mouth once a week.     zolpidem 5 MG tablet  Commonly known as:  AMBIEN  Take 1 tablet (5 mg total) by mouth at bedtime as needed for sleep.         Allergies  Allergen Reactions  . Hydrochlorothiazide     Pt gets hyponatremia  . Lasix [Furosemide]     Sodium levels drop when take  . Nsaids     Kidney disease/failure  . Sulfa Antibiotics Other (See Comments)    headaches    Past Medical History  Diagnosis Date  . Hypertension   . Headache(784.0)   . Alcohol withdrawal ([place]) 06/27/2011  . Alcohol withdrawal seizure ([place]) 06/28/2011  Alcohol withdraw seizure prior to admission is suspected from history given by family & Post ictal appearance in the ED.   Marland Kitchen Compression fracture of thoracic vertebra ([place]) 06/26/2011  . Fistula, bladder   . Colon polyps   . Diabetes mellitus type 2 in nonobese (HCC)   . Cancer [facility]) 2012    Prostate surgery  . Atrial fibrillation ([place])     one episode sept 2015  . Paroxysmal atrial fibrillation ([place]) 05/19/2014  . Sepsis due to urinary tract infection ([place]) 04/16/2014  . Urinary tract infection 04/14/2014  . History of adenomatous polyp of colon 06/14/2014  . Pneumonia     hx of  . PNA (pneumonia) 06/26/2011  . Anxiety   . Frequent urination at night   . Dry eyes   . Stroke ([place]) sept 1, 2015    tia x 3    Past Surgical History  Procedure Laterality Date  . Robot assisted laparoscopic radical prostatectomy  01/31/2011    Robotic-assisted laparoscopic radical retropubic  . Lipoma excision  2012    Dr Nonah Mattes  .  Colonoscopy N/A 06/14/2014    Procedure: COLONOSCOPY;  Surgeon: Gatha Mayer, MD;  [facility];  Service: Endoscopy;  Laterality: N/A;  . Proctoscopy N/A 06/15/2014    Procedure: RIGID PROCTOSCOPY;  Surgeon: Michael Boston, MD;  [facility];  Service: General;  Laterality: N/A;  . Cystoscopy with stent placement Bilateral 06/15/2014    Procedure: CYSTOSCOPY WITH BILATERAL STENT PLACEMENT;  Surgeon: Bernestine Amass, MD;  [facility];  Service: Urology;  Laterality: Bilateral;  . Radiology with anesthesia N/A 07/28/2014    Procedure: Embolization;  Surgeon: Luanne Bras, MD;  Location: [place];  Service: Radiology;  Laterality: N/A;  . Colon surgery      Social History   Social History  . Marital Status: Married    Spouse Name: N/A  . Number of Children: 2  . 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 Smoking status: Former Smoker -- 2.00 packs/day for 25 years    Types: Cigarettes    Quit date: 04/14/2014  . Smokeless tobacco: Never Used  . Alcohol Use: 4.2 - 6.0 oz/week    7-10 Standard drinks or equivalent per week     Comment: 2 bottles of wine with wife a week; cocktail every other night  . Drug Use: No  . Sexual Activity: Not Currently   Other Topics Concern  . None   Social History Narrative    Family History  Problem Relation Age of Onset  . Atrial fibrillation Father     onset 19s. Had pacemaker placed for sinus pause  . Prostate cancer Father   . Breast cancer Mother   . Lung cancer Mother   . Colon cancer Neg Hx   . Leukemia Brother   . Colon polyps Brother   . Diabetes Neg Hx     Review of Systems: As noted in HPI.  All other systems were reviewed and are negative.  Physical Exam: BP 126/80 mmHg  Pulse 85  Ht 5\' 9"  (1.753 m)  Wt 85.276 kg (188 lb)  BMI 27.75 kg/m2 Filed Weights   01/20/16 0940  Weight: 85.276 kg (188 lb)    LABORATORY DATA: Lab  Results  Component Value Date   WBC 9.4 09/19/2015   HGB 16.8 09/19/2015   HCT 50.1 09/19/2015   PLT 227.0 09/19/2015  GLUCOSE 325* 09/19/2015   CHOL 81 04/22/2014   TRIG 52 04/22/2014   HDL 41 04/22/2014   LDLCALC 30 04/22/2014   ALT 10 09/19/2015   AST 12 09/19/2015   NA 130* 09/19/2015   K 5.1 09/19/2015   CL 94* 09/19/2015   CREATININE 1.49 09/19/2015   BUN 13 09/19/2015   CO2 27 09/19/2015   TSH 1.25 09/30/2015   INR 1.05 07/28/2014   HGBA1C 7.6* 09/19/2015   Ecg today shows Atrial fibrillation with rate 85. Incomplete LBBB. I have personally reviewed and interpreted this study.  Assessment / Plan: 1. Paroxysmal AFib. Noted when admitted with sepsis. Now noted to be back in Afib with controlled response. He is asymptomatic. Recommend continued rate control with diltiazem and labetalol. Continue anticoagulation with Xarelto. Follow up in 6-8 months.  2. HTN- malignant. BP is well controlled today. Continue current therapy  3. S/p repair colovesical fistula.  4. Tobacco abuse- now quit  5. Etoh abuse- reports moderate use now.   6. CKD followed by nephrology in [facility].   7. Chronic diastolic CHF- well compensated. Encourage sodium restriction.

## 2016-01-20 NOTE — Patient Instructions (Addendum)
Continue your current therapy  I will see you in 6-8 months

## 2016-02-06 ENCOUNTER — Emergency Department (HOSPITAL_COMMUNITY): Payer: BC Managed Care – PPO

## 2016-02-06 ENCOUNTER — Encounter (HOSPITAL_COMMUNITY): Payer: Self-pay | Admitting: Family Medicine

## 2016-02-06 ENCOUNTER — Telehealth: Payer: Self-pay | Admitting: Cardiology

## 2016-02-06 ENCOUNTER — Emergency Department (HOSPITAL_COMMUNITY)
Admission: EM | Admit: 2016-02-06 | Discharge: 2016-02-06 | Disposition: A | Discharge status: Home / Self Care | Payer: BC Managed Care – PPO | Attending: Emergency Medicine | Admitting: Emergency Medicine

## 2016-02-06 ENCOUNTER — Other Ambulatory Visit: Payer: Self-pay

## 2016-02-06 DIAGNOSIS — Z8673 Personal history of transient ischemic attack (TIA), and cerebral infarction without residual deficits: Secondary | ICD-10-CM | POA: Insufficient documentation

## 2016-02-06 DIAGNOSIS — I5032 Chronic diastolic (congestive) heart failure: Secondary | ICD-10-CM | POA: Diagnosis not present

## 2016-02-06 DIAGNOSIS — E119 Type 2 diabetes mellitus without complications: Secondary | ICD-10-CM | POA: Diagnosis not present

## 2016-02-06 DIAGNOSIS — Z8546 Personal history of malignant neoplasm of prostate: Secondary | ICD-10-CM | POA: Insufficient documentation

## 2016-02-06 DIAGNOSIS — I110 Hypertensive heart disease with heart failure: Secondary | ICD-10-CM | POA: Insufficient documentation

## 2016-02-06 DIAGNOSIS — Z87891 Personal history of nicotine dependence: Secondary | ICD-10-CM | POA: Insufficient documentation

## 2016-02-06 DIAGNOSIS — Z79899 Other long term (current) drug therapy: Secondary | ICD-10-CM | POA: Diagnosis not present

## 2016-02-06 DIAGNOSIS — R319 Hematuria, unspecified: Secondary | ICD-10-CM | POA: Insufficient documentation

## 2016-02-06 DIAGNOSIS — I480 Paroxysmal atrial fibrillation: Secondary | ICD-10-CM | POA: Insufficient documentation

## 2016-02-06 DIAGNOSIS — R079 Chest pain, unspecified: Secondary | ICD-10-CM

## 2016-02-06 DIAGNOSIS — R0789 Other chest pain: Secondary | ICD-10-CM | POA: Diagnosis not present

## 2016-02-06 LAB — BASIC METABOLIC PANEL
ANION GAP: 7 (ref 5–15)
BUN: 23 mg/dL — ABNORMAL HIGH (ref 6–20)
CALCIUM: 8.2 mg/dL — AB (ref 8.9–10.3)
CO2: 22 mmol/L (ref 22–32)
Chloride: 97 mmol/L — ABNORMAL LOW (ref 101–111)
Creatinine, Ser: 1.92 mg/dL — ABNORMAL HIGH (ref 0.61–1.24)
GFR calc Af Amer: 41 mL/min — ABNORMAL LOW (ref >60)
GFR calc non Af Amer: 35 mL/min — ABNORMAL LOW (ref >60)
GLUCOSE: 172 mg/dL — AB (ref 65–99)
Potassium: 5.1 mmol/L (ref 3.5–5.1)
Sodium: 126 mmol/L — ABNORMAL LOW (ref 135–145)

## 2016-02-06 LAB — URINALYSIS, ROUTINE W REFLEX MICROSCOPIC
Bilirubin Urine: NEGATIVE
Glucose, UA: NEGATIVE mg/dL
Ketones, ur: NEGATIVE mg/dL
LEUKOCYTES UA: NEGATIVE
Nitrite: NEGATIVE
PROTEIN: 100 mg/dL — AB
Specific Gravity, Urine: 1.014 (ref 1.005–1.030)
pH: 6 (ref 5.0–8.0)

## 2016-02-06 LAB — I-STAT TROPONIN, ED
TROPONIN I, POC: 0 ng/mL (ref 0.00–0.08)
Troponin i, poc: 0.01 ng/mL (ref 0.00–0.08)

## 2016-02-06 LAB — CBC
HEMATOCRIT: 34.6 % — AB (ref 39.0–52.0)
HEMOGLOBIN: 12.6 g/dL — AB (ref 13.0–17.0)
MCH: 35 pg — AB (ref 26.0–34.0)
MCHC: 36.4 g/dL — AB (ref 30.0–36.0)
MCV: 96.1 fL (ref 78.0–100.0)
Platelets: 277 10*3/uL (ref 150–400)
RBC: 3.6 MIL/uL — ABNORMAL LOW (ref 4.22–5.81)
RDW: 12.1 % (ref 11.5–15.5)
WBC: 8.1 10*3/uL (ref 4.0–10.5)

## 2016-02-06 LAB — URINE MICROSCOPIC-ADD ON

## 2016-02-06 LAB — PROTIME-INR
INR: 2.81 — ABNORMAL HIGH (ref 0.00–1.49)
Prothrombin Time: 28.3 seconds — ABNORMAL HIGH (ref 11.6–15.2)

## 2016-02-06 LAB — D-DIMER, QUANTITATIVE (NOT AT ARMC): D DIMER QUANT: 0.32 ug{FEU}/mL (ref 0.00–0.50)

## 2016-02-06 MED ORDER — LORAZEPAM 2 MG/ML IJ SOLN
0.5000 mg | Freq: Once | INTRAMUSCULAR | Status: AC
  Administered 2016-02-06: 0.5 mg via INTRAVENOUS
  Filled 2016-02-06: qty 1

## 2016-02-06 NOTE — Discharge Instructions (Signed)
Try Zantac 150 mg twice a day for this pain. Follow up with your cardiologist. Follow-up with your urologist for hematuria. Nonspecific Chest Pain It is often hard to find the cause of chest pain. There is always a chance that your pain could be related to something serious, such as a heart attack or a blood clot in your lungs. Chest pain can also be caused by conditions that are not life-threatening. If you have chest pain, it is very important to follow up with your doctor.  HOME CARE  If you were prescribed an antibiotic medicine, finish it all even if you start to feel better.  Avoid any activities that cause chest pain.  Do not use any tobacco products, including cigarettes, chewing tobacco, or electronic cigarettes. If you need help quitting, ask your doctor.  Do not drink alcohol.  Take medicines only as told by your doctor.  Keep all follow-up visits as told by your doctor. This is important. This includes any further testing if your chest pain does not go away.  Your doctor may tell you to keep your head raised (elevated) while you sleep.  Make lifestyle changes as told by your doctor. These may include:  Getting regular exercise. Ask your doctor to suggest some activities that are safe for you.  Eating a heart-healthy diet. Your doctor or a diet specialist (dietitian) can help you to learn healthy eating options.  Maintaining a healthy weight.  Managing diabetes, if necessary.  Reducing stress. GET HELP IF:  Your chest pain does not go away, even after treatment.  You have a rash with blisters on your chest.  You have a fever. GET HELP RIGHT AWAY IF:  Your chest pain is worse.  You have an increasing cough, or you cough up blood.  You have severe belly (abdominal) pain.  You feel extremely weak.  You pass out (faint).  You have chills.  You have sudden, unexplained chest discomfort.  You have sudden, unexplained discomfort in your arms, back, neck, or  jaw.  You have shortness of breath at any time.  You suddenly start to sweat, or your skin gets clammy.  You feel nauseous.  You vomit.  You suddenly feel light-headed or dizzy.  Your heart begins to beat quickly, or it feels like it is skipping beats. These symptoms may be an emergency. Do not wait to see if the symptoms will go away. Get medical help right away. Call your local emergency services (911 in the [place] Do not drive yourself to the hospital.   This information is not intended to replace advice given to you by your health care provider. Make sure you discuss any questions you have with your health care provider.   Document Released: 01/23/2008 Document Revised: 08/27/2014 Document Reviewed: 03/12/2014 Elsevier Interactive Patient Education [employer].

## 2016-02-06 NOTE — Telephone Encounter (Signed)
New Message  Pt calling to schedule a post hospital appt. None avail. Pt requesting to speak with RN to see if he can be fit into the schedule. Please call back to discuss

## 2016-02-06 NOTE — ED Notes (Signed)
EDP at bedside  

## 2016-02-06 NOTE — ED Provider Notes (Signed)
CSN: [id number]     Arrival date & time 02/06/16  0209 History  By signing my name below, I, Evelene Croon, attest that this documentation has been prepared under the direction and in the presence of Deno Etienne, DO . Electronically Signed: Evelene Croon, Scribe. 02/06/2016. 3:31 AM.      Chief Complaint  Patient presents with  . Chest Pain    Patient is a 65 y.o. male presenting with chest pain. The history is provided by the patient. No language interpreter was used.  Chest Pain Pain location:  Substernal area Pain quality: pressure   Pain radiates to:  Does not radiate Pain radiates to the back: no   Pain severity:  Moderate Timing:  Intermittent Chronicity:  New Relieved by:  Certain positions Worsened by:  Certain positions Associated symptoms: shortness of breath   Associated symptoms: no fever   Risk factors: diabetes mellitus and hypertension   Risk factors: no prior DVT/PE    HPI Comments:  Khamarion Leak is a 65 y.o. male with a history of HTN, DM, and AFib, who presents to the Emergency Department complaining of intermittent episodes of CP since ~ 2230 last night. Pt describes his CP as a pressure in central chest with associated SOB. Pt states the episodes last ~ 30 seconds and resolves. His symptoms are exacerbated when supine and improved when he stands or sits up. He denies h/o similar CP. Pt also reports bilateral leg swelling x 1 month; notes BP meds were increased around this time.   He denies h/o CHF and h/o PE/DVT. Pt notes he recently drove to [facility] and back but denies any other long periods of immobilization. He is currently in remission for prostate CA; had radical prostatectomy. He also complains of hematuria x 20 hours.   Past Medical History  Diagnosis Date  . Hypertension   . Headache(784.0)   . Alcohol withdrawal ([place]) 06/27/2011  . Alcohol withdrawal seizure ([place]) 06/28/2011    Alcohol withdraw seizure prior to admission is suspected from history given by  family & Post ictal appearance in the ED.   Marland Kitchen Compression fracture of thoracic vertebra ([place]) 06/26/2011  . Fistula, bladder   . Colon polyps   . Diabetes mellitus type 2 in nonobese (HCC)   . Cancer [facility]) 2012    Prostate surgery  . Atrial fibrillation ([place])     one episode sept 2015  . Paroxysmal atrial fibrillation ([place]) 05/19/2014  . Sepsis due to urinary tract infection ([place]) 04/16/2014  . Urinary tract infection 04/14/2014  . History of adenomatous polyp of colon 06/14/2014  . Pneumonia     hx of  . PNA (pneumonia) 06/26/2011  . Anxiety   . Frequent urination at night   . Dry eyes   . Stroke ([place]) sept 1, 2015    tia x 3   Past Surgical History  Procedure Laterality Date  . Robot assisted laparoscopic radical prostatectomy  01/31/2011    Robotic-assisted laparoscopic radical retropubic  . Lipoma excision  2012    Dr Nonah Mattes  . Colonoscopy N/A 06/14/2014    Procedure: COLONOSCOPY;  Surgeon: Gatha Mayer, MD;  Location: [facility];  Service: Endoscopy;  Laterality: N/A;  . Proctoscopy N/A 06/15/2014    Procedure: RIGID PROCTOSCOPY;  Surgeon: Michael Boston, MD;  Location: [facility];  Service: General;  Laterality: N/A;  . Cystoscopy with stent placement Bilateral 06/15/2014    Procedure: CYSTOSCOPY WITH BILATERAL STENT PLACEMENT;  Surgeon: Bernestine Amass, MD;  Location: [facility];  Service: Urology;  Laterality: Bilateral;  . Radiology with anesthesia N/A 07/28/2014    Procedure: Embolization;  Surgeon: Luanne Bras, MD;  Location: [place];  Service: Radiology;  Laterality: N/A;  . Colon surgery    . Moles removed      Back   Family History  Problem Relation Age of Onset  . Atrial fibrillation Father     onset 76s. Had pacemaker placed for sinus pause  . Prostate cancer Father   . Breast cancer Mother   . Lung cancer Mother   . Colon cancer Neg Hx   . Leukemia Brother   . Colon polyps Brother   . Diabetes Neg Hx    Social History  Substance Use Topics  . Smoking  status: Former Smoker -- 2.00 packs/day for 25 years    Types: Cigarettes    Quit date: 04/14/2014  . Smokeless tobacco: Never Used  . Alcohol Use: 4.2 - 6.0 oz/week    7-10 Standard drinks or equivalent per week     Comment: 2 bottles of wine with wife a week; cocktail every other night    Review of Systems  Constitutional: Negative for fever and chills.  Respiratory: Positive for shortness of breath.   Cardiovascular: Positive for chest pain and leg swelling.  Genitourinary: Positive for hematuria.  All other systems reviewed and are negative.  Allergies  Hydrochlorothiazide; Lasix; Nsaids; and Sulfa antibiotics  Home Medications   Prior to Admission medications   Medication Sig Start Date End Date Taking? Authorizing Provider  BIDIL 20-37.5 MG tablet take 1 tablet by mouth twice a day Patient taking differently: Take 2 tablets by mouth each day. 07/05/15  Yes Peter M Martinique, MD  JANUVIA 100 MG tablet TAKE 1 TABLET DAILY 12/12/15  Yes Golden Circle, FNP  labetalol (NORMODYNE) 300 MG tablet Take 1 tablet (300 mg total) by mouth 3 (three) times daily. Patient taking differently: Take 400 mg by mouth 3 (three) times daily.  09/19/15  Yes Golden Circle, FNP  Multiple Vitamins-Minerals (MULTIVITAMINS THER. W/MINERALS) TABS Take 1 tablet by mouth every morning.    Yes Historical Provider, MD  olmesartan (BENICAR) 40 MG tablet TAKE 1 TABLET EVERY MORNING 12/19/15  Yes Golden Circle, FNP  rivaroxaban (XARELTO) 20 MG TABS tablet take 1 tablet by mouth once daily WITH SUPPER 09/05/15  Yes Golden Circle, FNP  Vitamin D, Ergocalciferol, (DRISDOL) 50000 units CAPS capsule Take 50,000 Units by mouth once a week. 09/26/15  Yes Historical Provider, MD  zolpidem (AMBIEN) 5 MG tablet Take 1 tablet (5 mg total) by mouth at bedtime as needed for sleep. 09/19/15  Yes Golden Circle, FNP  diltiazem (CARDIZEM CD) 240 MG 24 hr capsule TAKE 1 CAPSULE EVERY       MORNING 12/23/15   Golden Circle, FNP   feeding supplement, GLUCERNA SHAKE, (GLUCERNA SHAKE) LIQD Take 237 mLs by mouth 2 (two) times daily between meals.    Historical Provider, MD   BP 183/82 mmHg  Pulse 71  Temp(Src) 97.8 F (36.6 C) (Oral)  Resp 16  Ht 5\' 9"  (1.753 m)  Wt 185 lb (83.915 kg)  BMI 27.31 kg/m2  SpO2 97% Physical Exam  Constitutional: He is oriented to person, place, and time. He appears well-developed and well-nourished.  HENT:  Head: Normocephalic and atraumatic.  Eyes: EOM are normal. Pupils are equal, round, and reactive to light.  Neck: Normal range of motion. Neck supple. No JVD present.  Cardiovascular: Intact distal pulses.  An irregularly irregular rhythm present. Bradycardia present.  Exam reveals no gallop and no friction rub.   No murmur heard. Pulmonary/Chest: No respiratory distress. He has no wheezes.  Abdominal: He exhibits no distension. There is no rebound and no guarding.  Musculoskeletal: Normal range of motion.   no noted peripheral edema  Neurological: He is alert and oriented to person, place, and time.  Skin: No rash noted. No pallor.  Psychiatric: He has a normal mood and affect. His behavior is normal.  Nursing note and vitals reviewed.   ED Course  Procedures  DIAGNOSTIC STUDIES:  Oxygen Saturation is 100% on RA, normal by my interpretation.    COORDINATION OF CARE:  3:27 AM Discussed treatment plan with pt at bedside and pt agreed to plan.  Labs Review Labs Reviewed  BASIC METABOLIC PANEL - Abnormal; Notable for the following:    Sodium 126 (*)    Chloride 97 (*)    Glucose, Bld 172 (*)    BUN 23 (*)    Creatinine, Ser 1.92 (*)    Calcium 8.2 (*)    GFR calc non Af Amer 35 (*)    GFR calc Af Amer 41 (*)    All other components within normal limits  CBC - Abnormal; Notable for the following:    RBC 3.60 (*)    Hemoglobin 12.6 (*)    HCT 34.6 (*)    MCH 35.0 (*)    MCHC 36.4 (*)    All other components within normal limits  PROTIME-INR - Abnormal;  Notable for the following:    Prothrombin Time 28.3 (*)    INR 2.81 (*)    All other components within normal limits  URINALYSIS, ROUTINE W REFLEX MICROSCOPIC (NOT AT [facility]) - Abnormal; Notable for the following:    Hgb urine dipstick MODERATE (*)    Protein, ur 100 (*)    All other components within normal limits  URINE MICROSCOPIC-ADD ON - Abnormal; Notable for the following:    Squamous Epithelial / LPF 0-5 (*)    Bacteria, UA RARE (*)    All other components within normal limits  D-DIMER, QUANTITATIVE (NOT AT [facility])  Randolm Idol, ED    Imaging Review Dg Chest 2 View  02/06/2016  CLINICAL DATA:  Initial evaluation for acute intermittent chest pain. EXAM: CHEST  2 VIEW COMPARISON:  Prior radiograph from 10/20 Ts/15. FINDINGS: Mild cardiomegaly. Mediastinal silhouette within normal limits. Trach air column midline and patent. Lungs normally inflated. No focal infiltrate, pulmonary edema, or pleural effusion. No pneumothorax. No acute osseous abnormality. Compression deformity within the upper thoracic spine noted, stable. IMPRESSION: 1. No active cardiopulmonary disease. 2. Cardiomegaly without pulmonary edema. Electronically Signed   By: Jeannine Boga M.D.   On: 02/06/2016 03:30   I have personally reviewed and evaluated these images and lab results as part of my medical decision-making.   EKG Interpretation   Date/Time:  Monday February 06 2016 02:13:22 EDT Ventricular Rate:  59 PR Interval:    QRS Duration: 135 QT Interval:  514 QTC Calculation: 510 R Axis:   12 Text Interpretation:  Atrial fibrillation Left bundle branch block  Baseline wander in lead(s) V3 Since last tracing rate slower Otherwise no  significant change Confirmed by Roark Rufo MD, DANIEL [id number]) on 02/06/2016  5:02:04 AM      MDM   Final diagnoses:  Chest pain, unspecified chest pain type  Hematuria    65 yo M with chest  pain.  Atypical in nature.  DDimer negative.  Initial trop and ecg  unremarkable.   Delta negative, d/c home.   I personally performed the services described in this documentation, which was scribed in my presence. The recorded information has been reviewed and is accurate.   6:13 AM:  I have discussed the diagnosis/risks/treatment options with the patient and family and believe the pt to be eligible for discharge home to follow-up with PCP, cards, urology. We also discussed returning to the ED immediately if new or worsening sx occur. We discussed the sx which are most concerning (e.g., sudden worsening pain, fever, inability to tolerate by mouth) that necessitate immediate return. Medications administered to the patient during their visit and any new prescriptions provided to the patient are listed below.  Medications given during this visit Medications  LORazepam (ATIVAN) injection 0.5 mg (0.5 mg Intravenous Given 02/06/16 0457)    New Prescriptions   No medications on file    The patient appears reasonably screen and/or stabilized for discharge and I doubt any other medical condition or other [facility] requiring further screening, evaluation, or treatment in the ED at this time prior to discharge.    Deno Etienne, DO 02/06/16 [phone]

## 2016-02-06 NOTE — ED Notes (Signed)
Patient is complaining of mid sternal chest pain that started at 22:00 last night while sitting in the recliner. When lying down or stretcher out on the recliner, the pressure gets worse. Also, reports having blood in urine since yesterday. Denies any urgency or frequency.

## 2016-02-06 NOTE — Telephone Encounter (Signed)
Discussed w/ patient. Evaluated last night for chest pain w assoc SOB. He had r/o troponins/enzymes and other tests. Was recommended by ED physician to f/u w cardiology in next 2 days. Pt very concerned, anxious on phone and requested to be seen by Dr. Martinique. Made pt aware that Dr. Martinique was not in office this week. After some discussion, pt was amenable to being seen by extender. Discussed w/ scheduling, able to add for APP visit this week. Pt to be seen tom by Richardson Dopp at 1:30p.

## 2016-02-07 ENCOUNTER — Encounter: Payer: Self-pay | Admitting: Physician Assistant

## 2016-02-07 ENCOUNTER — Ambulatory Visit (INDEPENDENT_AMBULATORY_CARE_PROVIDER_SITE_OTHER): Payer: BC Managed Care – PPO | Admitting: Physician Assistant

## 2016-02-07 VITALS — BP 174/62 | HR 60 | Ht 69.0 in | Wt 192.2 lb

## 2016-02-07 DIAGNOSIS — I10 Essential (primary) hypertension: Secondary | ICD-10-CM

## 2016-02-07 DIAGNOSIS — R079 Chest pain, unspecified: Secondary | ICD-10-CM | POA: Diagnosis not present

## 2016-02-07 DIAGNOSIS — N183 Chronic kidney disease, stage 3 unspecified: Secondary | ICD-10-CM

## 2016-02-07 DIAGNOSIS — R319 Hematuria, unspecified: Secondary | ICD-10-CM

## 2016-02-07 DIAGNOSIS — I480 Paroxysmal atrial fibrillation: Secondary | ICD-10-CM | POA: Diagnosis not present

## 2016-02-07 DIAGNOSIS — I5032 Chronic diastolic (congestive) heart failure: Secondary | ICD-10-CM | POA: Diagnosis not present

## 2016-02-07 DIAGNOSIS — R0602 Shortness of breath: Secondary | ICD-10-CM

## 2016-02-07 NOTE — Progress Notes (Signed)
Cardiology Office Note:    Date:  02/07/2016   ID:  [name], DOB 05-20-1951, MRN [id number]  PCP:  Mauricio Po, FNP  Cardiologist:  Dr. Peter Martinique   Electrophysiologist:  N/a Nephrologist: Dr. Sindy Messing Urologist: Dr. Risa Grill  Referring MD: Golden Circle, FNP   Chief Complaint  Patient presents with  . Chest Pain    History of Present Illness:     Douglas Ballard is a 65 y.o. male with a hx of PAF, malignant HTN, CKD, tobacco abuse, alcohol abuse, diabetes, colovesicular fistula. He was admitted in 8/15 with acute renal failure, sepsis, expressive aphasia, diastolic HF in AF with RVR. He developed acute delirium with alcohol withdrawal. He recovered from this and eventually had fistula repair. Echo demonstrated normal EF, moderate biatrial enlargement, mild LVH. Myoview demonstrated normal perfusion. He has been on Xarelto for anticoagulation. Last seen by Dr. Martinique 01/20/16.  He was back in atrial fibrillation with controlled ventricular rate.  He returns for follow-up today after visiting the emergency room yesterday with chest pain and hematuria. Cardiac enzymes were negative. D-dimer was negative.  Chest x-ray was unremarkable.  He tells me that he was in his usual state of health until Sunday night. He had gone out for dinner for Father's Day. Around 10 PM, he started having chest pressure with associated dyspnea. Symptoms were worse with lying flat. They did improve with leaning forward. He could not get comfortable and started to develop significant anxiety. Finally, at 1:30 in the morning, he went to the emergency room. He tells me that his blood pressure was up and down throughout his stay in the ED. His heart rate was also in the 40s at times. He denies syncope. He was dizzy at times in the ED. His nephrologist in [facility] recently increased 2 medicines for his blood pressure. He's had some dependent leg edema since that time. He denies any history of PND. He has  noticed hematuria recently. This is resolved. He sees his urologist tomorrow.   Past Medical History  Diagnosis Date  . Hypertension   . Alcohol withdrawal seizure ([place]) 06/28/2011    Alcohol withdraw seizure prior to admission is suspected from history given by family & Post ictal appearance in the ED.   Marland Kitchen Compression fracture of thoracic vertebra ([place]) 06/26/2011  . Fistula, bladder   . Diabetes mellitus type 2 in nonobese (HCC)   . Cancer [facility]) 2012    Prostate surgery  . Atrial fibrillation ([place])   . Sepsis due to urinary tract infection ([place]) 04/16/2014  . History of adenomatous polyp of colon 06/14/2014  . PNA (pneumonia) 06/26/2011  . Anxiety   . Frequent urination at night   . Stroke ([place]) sept 1, 2015    tia x 3  . Chronic diastolic CHF (congestive heart failure) ([place]) 11/01/2014    Echo 8/15: Mild LVH, EF 50-55%, moderate BAE   . History of nuclear stress test     a. Myoview 10/15: Overall Impression: Low risk stress nuclear study demonstrating mild baseline ST-T changes with normal myocardial perfusion and low normal EF of 50%.    Past Surgical History  Procedure Laterality Date  . Robot assisted laparoscopic radical prostatectomy  01/31/2011    Robotic-assisted laparoscopic radical retropubic  . Lipoma excision  2012    Dr Nonah Mattes  . Colonoscopy N/A 06/14/2014    Procedure: COLONOSCOPY;  Surgeon: Gatha Mayer, MD;  [facility];  Service: Endoscopy;  Laterality: N/A;  .  Proctoscopy N/A 06/15/2014    Procedure: RIGID PROCTOSCOPY;  Surgeon: Michael Boston, MD;  Location: [facility];  Service: General;  Laterality: N/A;  . Cystoscopy with stent placement Bilateral 06/15/2014    Procedure: CYSTOSCOPY WITH BILATERAL STENT PLACEMENT;  Surgeon: Bernestine Amass, MD;  [facility];  Service: Urology;  Laterality: Bilateral;  . Radiology with anesthesia N/A 07/28/2014    Procedure: Embolization;  Surgeon: Luanne Bras, MD;  Location: [place];  Service: Radiology;   Laterality: N/A;  . Colon surgery    . Moles removed      Back    Current Medications: Outpatient Prescriptions Prior to Visit  Medication Sig Dispense Refill  . diltiazem (CARDIZEM CD) 240 MG 24 hr capsule TAKE 1 CAPSULE EVERY       MORNING 90 capsule 0  . JANUVIA 100 MG tablet TAKE 1 TABLET DAILY 30 tablet 2  . Multiple Vitamins-Minerals (MULTIVITAMINS THER. W/MINERALS) TABS Take 1 tablet by mouth every morning.     . olmesartan (BENICAR) 40 MG tablet TAKE 1 TABLET EVERY MORNING 90 tablet 0  . rivaroxaban (XARELTO) 20 MG TABS tablet take 1 tablet by mouth once daily WITH SUPPER 30 tablet 0  . Vitamin D, Ergocalciferol, (DRISDOL) 50000 units CAPS capsule Take 50,000 Units by mouth once a week.  0  . zolpidem (AMBIEN) 5 MG tablet Take 1 tablet (5 mg total) by mouth at bedtime as needed for sleep. 90 tablet 0  . BIDIL 20-37.5 MG tablet take 1 tablet by mouth twice a day (Patient not taking: Reported on 02/07/2016) 180 tablet 2  . feeding supplement, GLUCERNA SHAKE, (GLUCERNA SHAKE) LIQD Take 237 mLs by mouth 2 (two) times daily between meals. Reported on 02/07/2016    . labetalol (NORMODYNE) 300 MG tablet Take 1 tablet (300 mg total) by mouth 3 (three) times daily. (Patient not taking: Reported on 02/07/2016) 270 tablet 0   No facility-administered medications prior to visit.      Allergies:   Hydrochlorothiazide; Lasix; Nsaids; and Sulfa antibiotics   Social History   Social History  . Marital Status: Married    Spouse Name: N/A  . Number of Children: 2  . 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 Smoking status: Former Smoker -- 2.00 packs/day for 25 years    Types: Cigarettes    Quit date: 04/14/2014  . Smokeless tobacco: Never Used  . Alcohol Use: 4.2 - 6.0 oz/week    7-10 Standard drinks or equivalent per week     Comment: 2 bottles of wine with wife a week; cocktail every other night  . Drug Use: No    . Sexual Activity: Not Currently   Other Topics Concern  . None   Social History Narrative     Family History:  The patient's family history includes Atrial fibrillation in his father; Breast cancer in his mother; Colon polyps in his brother; Leukemia in his brother; Lung cancer in his mother; Prostate cancer in his father. There is no history of Colon cancer or Diabetes.   ROS:   Please see the history of present illness.    Review of Systems  Cardiovascular: Positive for irregular heartbeat and leg swelling.  Respiratory: Positive for shortness of breath.   Gastrointestinal: Positive for hematochezia.  Genitourinary: Positive for hematuria.  Neurological: Positive for dizziness.   All other systems reviewed and are negative.   Physical Exam:  VS:  BP 174/62 mmHg  Pulse 60  Ht 5\' 9"  (1.753 m)  Wt 192 lb 3.2 oz (87.181 kg)  BMI 28.37 kg/m2   Physical Exam  Constitutional: He is oriented to person, place, and time. He appears well-developed and well-nourished.  HENT:  Head: Normocephalic and atraumatic.  Neck: Normal range of motion. No JVD present.  Cardiovascular: Normal rate, regular rhythm, S1 normal and S2 normal.  Exam reveals no gallop and no friction rub.   No murmur heard. Pulmonary/Chest: Effort normal and breath sounds normal. He has no wheezes. He has no rhonchi. He has no rales.  Abdominal: Soft. He exhibits no distension. There is no tenderness.  Musculoskeletal: Normal range of motion.  Trace bilateral LE edema  Neurological: He is alert and oriented to person, place, and time.  Skin: Skin is warm and dry.  Psychiatric: He has a normal mood and affect.    Wt Readings from Last 3 Encounters:  02/07/16 192 lb 3.2 oz (87.181 kg)  02/06/16 185 lb (83.915 kg)  01/20/16 188 lb (85.276 kg)      Studies/Labs Reviewed:     EKG:  EKG is   ordered today.  The ekg ordered today demonstrates NSR, HR 60, leftward axis, ant-septal Q waves, NSSTTW changes, QTc  448 ms, no change from prior tracing (except now in NSR)  Recent Labs: 09/19/2015: ALT 10 09/30/2015: TSH 1.25 02/06/2016: BUN 23*; Creatinine, Ser 1.92*; Hemoglobin 12.6*; Platelets 277; Potassium 5.1; Sodium 126*   Recent Lipid Panel    Component Value Date/Time   CHOL 81 04/22/2014 0141   TRIG 52 04/22/2014 0141   HDL 41 04/22/2014 0141   CHOLHDL 2.0 04/22/2014 0141   VLDL 10 04/22/2014 0141   LDLCALC 30 04/22/2014 0141    Additional studies/ records that were reviewed today include:   Myoview 10/15 Overall Impression: Low risk stress nuclear study demonstrating mild baseline ST-T changes with normal myocardial perfusion and low normal EF of 50%.  Echo 8/15 - Left ventricle: The cavity size was normal. Wall thickness was increased in a pattern of mild LVH. Systolic function was normal. The estimated ejection fraction was in the range of 50% to 55%. Regional wall motion abnormalities cannot be excluded. - Left atrium: The atrium was moderately dilated. - Right atrium: The atrium was moderately dilated.   ASSESSMENT:     1. Chest pain, unspecified chest pain type   2. Shortness of breath   3. Paroxysmal atrial fibrillation (HCC)   4. Chronic diastolic CHF (congestive heart failure) ([place])   5. Essential hypertension   6. CKD (chronic kidney disease) stage 3, GFR 30-59 ml/min   7. Hematuria     PLAN:     In order of problems listed above:  1. Chest pain - He has typical and atypical features. ECG is unchanged. He has not undergone stress testing since 2015. He has risk factors which include hypertension, diabetes and prior smoking. He had several hours of chest discomfort with negative enzymes in the ED. This is reassuring. However, I believe he should undergo stress testing. Arrange ETT-Myoview.  2. Shortness of breath - He had what sounds like orthopnea and dyspnea associated with this chest symptoms. He does not appear to be particularly volume overloaded on  exam today. He does have a history of diastolic CHF. He is not on diuretic therapy. He does have a history of hyponatremia. Obtain 2-D echocardiogram and BNP. If his BNP is significantly elevated, I will have to review  with his nephrologist regarding diuretic therapy.  3. Paroxysmal atrial fibrillation -  He is back in NSR today. Continue Xarelto. Recent creatinine clearance 42.5 by labs performed yesterday. If his creatinine clearance continues to remain below 50, we will have to adjust his Xarelto 15 mg daily.  4. Chronic diastolic CHF - As noted I will obtain a BNP and echocardiogram.  5. HTN - His blood pressure is uncontrolled. His nephrologist has been adjusting his medications. He plans to see his nephrologist again in the near future. I will defer any further medication adjustments to his nephrologist.    6. CKD - Recent creatinine somewhat increased. As noted, he will see his nephrologist again in the next 1-2 weeks.  7. Hematuria  -  He will see his urologist tomorrow for follow-up.    Medication Adjustments/Labs and Tests Ordered: Current medicines are reviewed at length with the patient today.  Concerns regarding medicines are outlined above.  Medication changes, Labs and Tests ordered today are outlined in the Patient Instructions noted below. Patient Instructions  Medication Instructions:  Your physician recommends that you continue on your current medications as directed. Please refer to the Current Medication list given to you today. Labwork: TODAY BNP Testing/Procedures: 1. Your physician has requested that you have en exercise stress myoview. For further information please visit [email or web address]. Please follow instruction sheet, as given. 2. Your physician has requested that you have an echocardiogram. Echocardiography is a painless test that uses sound waves to create images of your heart. It provides your doctor with information about the size and shape of your heart and  how well your heart's chambers and valves are working. This procedure takes approximately one hour. There are no restrictions for this procedure. Follow-Up: DR. Martinique IN ABOUT 4-6 WEEKS Any Other Special Instructions Will Be Listed Below (If Applicable). If you need a refill on your cardiac medications before your next appointment, please call your pharmacy.   Signed, Richardson Dopp, PA-C  02/07/2016 2:43 PM    [facility] [place], [place], [place]  [address] Phone: [phone]; Fax: [phone]

## 2016-02-07 NOTE — Patient Instructions (Addendum)
Medication Instructions:  Your physician recommends that you continue on your current medications as directed. Please refer to the Current Medication list given to you today. Labwork: TODAY BNP Testing/Procedures: 1. Your physician has requested that you have en exercise stress myoview. For further information please visit [email or web address]. Please follow instruction sheet, as given. 2. Your physician has requested that you have an echocardiogram. Echocardiography is a painless test that uses sound waves to create images of your heart. It provides your doctor with information about the size and shape of your heart and how well your heart's chambers and valves are working. This procedure takes approximately one hour. There are no restrictions for this procedure. Follow-Up: DR. Martinique IN ABOUT 4-6 WEEKS Any Other Special Instructions Will Be Listed Below (If Applicable). If you need a refill on your cardiac medications before your next appointment, please call your pharmacy.

## 2016-02-08 ENCOUNTER — Encounter: Payer: Self-pay | Admitting: Family

## 2016-02-08 ENCOUNTER — Ambulatory Visit (INDEPENDENT_AMBULATORY_CARE_PROVIDER_SITE_OTHER): Payer: BC Managed Care – PPO | Admitting: Family

## 2016-02-08 VITALS — BP 154/88 | HR 62 | Temp 98.1°F | Resp 16 | Ht 69.0 in | Wt 190.0 lb

## 2016-02-08 DIAGNOSIS — G4700 Insomnia, unspecified: Secondary | ICD-10-CM | POA: Diagnosis not present

## 2016-02-08 DIAGNOSIS — I5032 Chronic diastolic (congestive) heart failure: Secondary | ICD-10-CM

## 2016-02-08 DIAGNOSIS — R0789 Other chest pain: Secondary | ICD-10-CM

## 2016-02-08 DIAGNOSIS — R319 Hematuria, unspecified: Secondary | ICD-10-CM | POA: Diagnosis not present

## 2016-02-08 LAB — BRAIN NATRIURETIC PEPTIDE: BRAIN NATRIURETIC PEPTIDE: 395.1 pg/mL — AB (ref <100)

## 2016-02-08 MED ORDER — OLMESARTAN MEDOXOMIL 40 MG PO TABS: 40.0000 mg | ORAL_TABLET | Freq: Every morning | ORAL | Status: DC

## 2016-02-08 MED ORDER — SITAGLIPTIN PHOSPHATE 100 MG PO TABS: 100.0000 mg | ORAL_TABLET | Freq: Every day | ORAL | Status: DC

## 2016-02-08 MED ORDER — ZOLPIDEM TARTRATE 5 MG PO TABS: 5.0000 mg | ORAL_TABLET | Freq: Every evening | ORAL | Status: DC | PRN

## 2016-02-08 MED ORDER — DILTIAZEM HCL ER COATED BEADS 240 MG PO CP24: ORAL_CAPSULE | ORAL | Status: DC

## 2016-02-08 NOTE — Assessment & Plan Note (Addendum)
This is a new problem. Stable with no further episodes of gross hematuria and seen by urology with upcoming CT scan scheduled. Follow and additional changes per urology. Patient is also awaiting appointment with nephrology.

## 2016-02-08 NOTE — Patient Instructions (Signed)
Thank you for choosing [facility].  Summary/Instructions:  Please continue to take your medications as prescribed.   Your prescription(s) have been submitted to your pharmacy or been printed and provided for you. Please take as directed and contact our office if you believe you are having problem(s) with the medication(s) or have any questions.  If your symptoms worsen or fail to improve, please contact our office for further instruction, or in case of emergency go directly to the emergency room at the closest medical facility.   Please follow up with cardiology, nephrology, and urology.

## 2016-02-08 NOTE — Assessment & Plan Note (Signed)
BNP slightly elevated with mild lower extremity edema and no shortness of breath or activity intolerance. Continue to monitor and reduce risk of disease progression through control of hypertension. Follow up and changes per cardiology.

## 2016-02-08 NOTE — Progress Notes (Signed)
Subjective:    Patient ID: Douglas Ballard, male    DOB: Dec 18, 1950, 65 y.o.   MRN: [id number]  Chief Complaint  Patient presents with  . Hospitalization Follow-up    was seen for chest pain and hematuria, was seen by urologist this morning and has a ct scan set up for next week and has echo set up for the beginning of july    HPI:  Douglas Ballard is a 65 y.o. 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 and presents today for a hospital follow up.  Recently evaluated in emergency department for substernal chest pain that was intermittent the have been going on for several hours prior to arrival. Symptoms were exacerbated when lying supine and improvement sitting up. He also reported bilateral leg swelling 1 month. Physical exam with an irregularly irregular rhythm and no noted peripheral edema. Initial ECG and troponins were unremarkable. D-dimer was negative. He was discharged with recommended follow up to primary care, cardiology, and urology. Seen by cardiology following his visit with chest pain having typical and atypical features with his ECG being unchanged. It was recommended an additional stress test. Physical exam showed no volume overload with history of diastolic heart failure. BNP was obtained and noted to be 395. Follow-up was with urology for possible diuresis. He was noted to be in normal sinus rhythm upon evaluation in the cardiology office. Blood pressure  has remained uncontrolled and currently adjusted by nephrology.  Described the pain in his chest as squeezing at the time which caused him to feel short of breath. Has not had any additional episodes of chest tightness or shortness of breath Hematuria has also improved with no gross hematuria noted. He was seen by urology and has a CT scan scheduled to check for any possible pathology.   Allergies  Allergen Reactions  . Hydrochlorothiazide     Pt gets hyponatremia  . Lasix [Furosemide]     Sodium levels drop when take  . Nsaids     Kidney disease/failure  . Sulfa Antibiotics Other (See Comments)    headaches     Current Outpatient Prescriptions on File Prior to Visit  Medication Sig Dispense Refill  . isosorbide-hydrALAZINE (BIDIL) 20-37.5 MG tablet Take 2 tablets by mouth 2 (two) times daily.     Marland Kitchen LABETALOL HCL PO Take 400 mg by mouth 3 (three) times daily.    . Multiple Vitamins-Minerals (MULTIVITAMINS THER. W/MINERALS) TABS Take 1 tablet by mouth every morning.     . rivaroxaban (XARELTO) 20 MG TABS tablet take 1 tablet by mouth once daily WITH SUPPER 30 tablet 0  . Vitamin D, Ergocalciferol, (DRISDOL) 50000 units CAPS capsule Take 50,000 Units by mouth once a week.  0   No current facility-administered medications on file prior to visit.     Past Surgical History  Procedure Laterality Date  . Robot assisted laparoscopic radical prostatectomy  01/31/2011    Robotic-assisted laparoscopic radical retropubic  . Lipoma excision  2012    Dr Nonah Mattes  . Colonoscopy N/A 06/14/2014    Procedure: COLONOSCOPY;  Surgeon: Gatha Mayer, MD;  Location: [facility];  Service: Endoscopy;  Laterality: N/A;  . Proctoscopy N/A 06/15/2014    Procedure: RIGID PROCTOSCOPY;  Surgeon: Michael Boston, MD;  [facility];  Service: General;  Laterality: N/A;  . Cystoscopy with stent placement Bilateral 06/15/2014    Procedure: CYSTOSCOPY WITH BILATERAL STENT PLACEMENT;  Surgeon: Bernestine Amass, MD;  [facility];  Service: Urology;  Laterality: Bilateral;  . Radiology with anesthesia N/A 07/28/2014    Procedure: Embolization;  Surgeon: Luanne Bras, MD;  Location: [place];  Service: Radiology;  Laterality: N/A;  . Colon surgery    . Moles removed      Back    Past Medical History  Diagnosis Date  . Hypertension   . Alcohol withdrawal seizure ([place]) 06/28/2011    Alcohol withdraw seizure prior to admission is suspected from history given by family & Post ictal appearance in the ED.   Marland Kitchen Compression fracture of thoracic vertebra ([place]) 06/26/2011  . Fistula, bladder   . Diabetes mellitus type 2 in nonobese (HCC)   . Cancer [facility]) 2012    Prostate surgery  . Atrial fibrillation ([place])   . Sepsis due to urinary tract infection ([place]) 04/16/2014  . History of adenomatous polyp of colon 06/14/2014  . PNA (pneumonia) 06/26/2011  . Anxiety   . Frequent urination at night   . Stroke ([place]) sept 1, 2015    tia x 3  . Chronic diastolic CHF (congestive heart failure) ([place]) 11/01/2014    Echo 8/15: Mild LVH, EF 50-55%, moderate BAE   . History of nuclear stress test     a. Myoview 10/15: Overall Impression: Low risk stress nuclear study demonstrating mild baseline ST-T changes with normal myocardial perfusion and low normal EF of 50%.     Review of Systems  Constitutional: Negative for fever and chills.  Respiratory: Negative for chest tightness and shortness of breath.   Cardiovascular: Negative for chest pain, palpitations and leg swelling.  Genitourinary: Negative for urgency, frequency, hematuria and testicular pain.      Objective:    BP 154/88 mmHg  Pulse 62  Temp(Src) 98.1 F (36.7 C) (Oral)  Resp 16  Ht 5\' 9"  (1.753 m)  Wt 190 lb (86.183 kg)  BMI 28.05 kg/m2  SpO2 96% Nursing note and vital signs reviewed.  Physical Exam  Constitutional: He is oriented to person, place, and time. He appears well-developed and well-nourished. No distress.  Cardiovascular:  Normal rate, regular rhythm, normal heart sounds and intact distal pulses.   Pulmonary/Chest: Effort normal and breath sounds normal.  Abdominal: There is no CVA tenderness.  Neurological: He is alert and oriented to person, place, and time.  Skin: Skin is warm and dry.  Psychiatric: He has a normal mood and affect. His behavior is normal. Judgment and thought content normal.       Assessment & Plan:   Problem List Items Addressed This Visit      Cardiovascular and Mediastinum   Chronic diastolic CHF (congestive heart failure) (HCC)    BNP slightly elevated with mild lower extremity edema and no shortness of breath or activity intolerance. Continue to monitor and reduce risk of disease progression through control of hypertension. Follow up and changes per cardiology.      Relevant Medications   olmesartan (BENICAR) 40 MG tablet   diltiazem (CARDIZEM CD) 240 MG 24 hr capsule     Other   Insomnia - Primary   Relevant Medications  zolpidem (AMBIEN) 5 MG tablet   Chest tightness    No further episodes of chest tightness since leaving the ED. Currently awaiting a stress test with cardiology. Heart sounds consistent with NSR. Lungs are clear. BNP elevated with mild lower extremity edema with no shortness of breath. Follow up pending additional testing by cardiology.      Hematuria    This is a new problem. Stable with no further episodes of gross hematuria and seen by urology with upcoming CT scan scheduled. Follow and additional changes per urology. Patient is also awaiting appointment with nephrology.          I have changed Mr. Jusin Quesinberry to sitaGLIPtin. I have also changed his olmesartan. I am also having him maintain his multivitamins ther. w/minerals, rivaroxaban, Vitamin D (Ergocalciferol), isosorbide-hydrALAZINE, LABETALOL HCL PO, zolpidem, and diltiazem.   Meds ordered this encounter  Medications  . zolpidem (AMBIEN) 5 MG tablet    Sig: Take 1 tablet (5 mg total)  by mouth at bedtime as needed for sleep.    Dispense:  90 tablet    Refill:  0  . olmesartan (BENICAR) 40 MG tablet    Sig: Take 1 tablet (40 mg total) by mouth every morning.    Dispense:  90 tablet    Refill:  0  . sitaGLIPtin (JANUVIA) 100 MG tablet    Sig: Take 1 tablet (100 mg total) by mouth daily.    Dispense:  30 tablet    Refill:  2  . diltiazem (CARDIZEM CD) 240 MG 24 hr capsule    Sig: TAKE 1 CAPSULE EVERY       MORNING    Dispense:  90 capsule    Refill:  0     Follow-up: Return in about 1 month (around 03/09/2016), or if symptoms worsen or fail to improve.  Mauricio Po, FNP

## 2016-02-08 NOTE — Assessment & Plan Note (Signed)
No further episodes of chest tightness since leaving the ED. Currently awaiting a stress test with cardiology. Heart sounds consistent with NSR. Lungs are clear. BNP elevated with mild lower extremity edema with no shortness of breath. Follow up pending additional testing by cardiology.

## 2016-02-09 ENCOUNTER — Telehealth: Payer: Self-pay | Admitting: *Deleted

## 2016-02-09 DIAGNOSIS — N183 Chronic kidney disease, stage 3 unspecified: Secondary | ICD-10-CM

## 2016-02-09 DIAGNOSIS — I10 Essential (primary) hypertension: Secondary | ICD-10-CM

## 2016-02-09 DIAGNOSIS — I5032 Chronic diastolic (congestive) heart failure: Secondary | ICD-10-CM

## 2016-02-09 NOTE — Telephone Encounter (Signed)
I s/w Amelia at DR. Nwobu, Nehprologist office in regards lasix for pt. Richardson Dopp, PA would like to kow if Dr. Neta Ehlers would be ok with pt starting a small dose of lasix  (lasix 20 mg every Mon, Wed and Fri's). Amelia to contact Dr. Neta Ehlers about lasix.

## 2016-02-10 NOTE — Telephone Encounter (Signed)
Dr. Nicholes Rough, who is covering provider at Dr. Dyann Kief office, returned call to [place] PA. He is agreeable to small dose of lasix three times weekly. Dr. Nicholes Rough will follow-up with patient's labs as well.

## 2016-02-13 MED ORDER — FUROSEMIDE 20 MG PO TABS: 20.0000 mg | ORAL_TABLET | ORAL | Status: DC

## 2016-02-13 NOTE — Telephone Encounter (Signed)
Pt notified of lab results and recommendations to start Lasix 20 mg every Mon, Wed, and Fri's. Pt aware Nephrologist ok with plan as well. BMET 7/5. Rx sent to [facility].

## 2016-02-22 DIAGNOSIS — N179 Acute kidney failure, unspecified: Secondary | ICD-10-CM | POA: Insufficient documentation

## 2016-02-29 ENCOUNTER — Telehealth (HOSPITAL_COMMUNITY): Payer: Self-pay | Admitting: *Deleted

## 2016-02-29 NOTE — Telephone Encounter (Signed)
Left message on voicemail per DPR in reference to upcoming appointment scheduled on 03/05/16 at 0945 with detailed instructions given per Myocardial Perfusion Study Information Sheet for the test. LM to arrive 15 minutes early, and that it is imperative to arrive on time for appointment to keep from having the test rescheduled. If you need to cancel or reschedule your appointment, please call the office within 24 hours of your appointment. Failure to do so may result in a cancellation of your appointment, and a $50 no show fee. Phone number given for call back for any questions. Tykiera Raven, Ranae Palms

## 2016-03-05 ENCOUNTER — Ambulatory Visit (HOSPITAL_COMMUNITY): Payer: BC Managed Care – PPO | Attending: Cardiology

## 2016-03-05 ENCOUNTER — Encounter: Payer: Self-pay | Admitting: Physician Assistant

## 2016-03-05 ENCOUNTER — Telehealth: Payer: Self-pay | Admitting: *Deleted

## 2016-03-05 ENCOUNTER — Other Ambulatory Visit: Payer: Self-pay

## 2016-03-05 ENCOUNTER — Ambulatory Visit (HOSPITAL_COMMUNITY): Payer: BC Managed Care – PPO

## 2016-03-05 DIAGNOSIS — I1310 Hypertensive heart and chronic kidney disease without heart failure, with stage 1 through stage 4 chronic kidney disease, or unspecified chronic kidney disease: Secondary | ICD-10-CM | POA: Insufficient documentation

## 2016-03-05 DIAGNOSIS — Z720 Tobacco use: Secondary | ICD-10-CM | POA: Diagnosis not present

## 2016-03-05 DIAGNOSIS — I371 Nonrheumatic pulmonary valve insufficiency: Secondary | ICD-10-CM | POA: Insufficient documentation

## 2016-03-05 DIAGNOSIS — N189 Chronic kidney disease, unspecified: Secondary | ICD-10-CM | POA: Insufficient documentation

## 2016-03-05 DIAGNOSIS — R0602 Shortness of breath: Secondary | ICD-10-CM | POA: Insufficient documentation

## 2016-03-05 DIAGNOSIS — R079 Chest pain, unspecified: Secondary | ICD-10-CM | POA: Diagnosis not present

## 2016-03-05 DIAGNOSIS — E1122 Type 2 diabetes mellitus with diabetic chronic kidney disease: Secondary | ICD-10-CM | POA: Insufficient documentation

## 2016-03-05 DIAGNOSIS — I340 Nonrheumatic mitral (valve) insufficiency: Secondary | ICD-10-CM | POA: Insufficient documentation

## 2016-03-05 NOTE — Telephone Encounter (Signed)
Pt has been notified of echo results and findings by phone with verbal understanding.

## 2016-03-08 ENCOUNTER — Encounter (INDEPENDENT_AMBULATORY_CARE_PROVIDER_SITE_OTHER): Payer: Self-pay

## 2016-03-08 ENCOUNTER — Ambulatory Visit (HOSPITAL_COMMUNITY): Payer: BC Managed Care – PPO | Attending: Cardiology

## 2016-03-08 VITALS — Ht 69.0 in | Wt 192.0 lb

## 2016-03-08 DIAGNOSIS — R079 Chest pain, unspecified: Secondary | ICD-10-CM | POA: Diagnosis not present

## 2016-03-08 DIAGNOSIS — I119 Hypertensive heart disease without heart failure: Secondary | ICD-10-CM | POA: Diagnosis not present

## 2016-03-08 DIAGNOSIS — R0602 Shortness of breath: Secondary | ICD-10-CM | POA: Diagnosis not present

## 2016-03-08 DIAGNOSIS — E119 Type 2 diabetes mellitus without complications: Secondary | ICD-10-CM | POA: Diagnosis not present

## 2016-03-08 DIAGNOSIS — R0609 Other forms of dyspnea: Secondary | ICD-10-CM | POA: Diagnosis not present

## 2016-03-08 LAB — MYOCARDIAL PERFUSION IMAGING
CHL CUP MPHR: 156 {beats}/min
CHL CUP NUCLEAR SSS: 3
CSEPED: 5 min
CSEPEDS: 0 s
CSEPPHR: 133 {beats}/min
Estimated workload: 6.6 METS
LVDIAVOL: 176 mL (ref 62–150)
LVSYSVOL: 89 mL
NUC STRESS TID: 0.98
Percent HR: 85 %
RATE: 0.3
RPE: 19
Rest HR: 70 {beats}/min
SDS: 2
SRS: 1

## 2016-03-08 MED ORDER — TECHNETIUM TC 99M TETROFOSMIN IV KIT
10.3000 | PACK | Freq: Once | INTRAVENOUS | Status: AC | PRN
  Administered 2016-03-08: 10 via INTRAVENOUS
  Filled 2016-03-08: qty 10

## 2016-03-08 MED ORDER — TECHNETIUM TC 99M TETROFOSMIN IV KIT
32.6000 | PACK | Freq: Once | INTRAVENOUS | Status: AC | PRN
  Administered 2016-03-08: 33 via INTRAVENOUS
  Filled 2016-03-08: qty 33

## 2016-03-08 MED ORDER — REGADENOSON 0.4 MG/5ML IV SOLN
0.4000 mg | Freq: Once | INTRAVENOUS | Status: AC
  Administered 2016-03-08: 0.4 mg via INTRAVENOUS

## 2016-03-09 ENCOUNTER — Telehealth: Payer: Self-pay | Admitting: *Deleted

## 2016-03-09 ENCOUNTER — Encounter: Payer: Self-pay | Admitting: Physician Assistant

## 2016-03-09 MED ORDER — LABETALOL HCL 200 MG PO TABS: 400.0000 mg | ORAL_TABLET | Freq: Three times a day (TID) | ORAL | Status: DC

## 2016-03-09 NOTE — Telephone Encounter (Signed)
DPR ok to lmom myoview normal per [employer]. PA. If any questions call back [phone]. Continue current Tx plan.

## 2016-03-09 NOTE — Telephone Encounter (Signed)
Pt notified of myoview results by phone with verbal understanding. Pt said I made his weekend better with the good news.

## 2016-03-20 ENCOUNTER — Other Ambulatory Visit: Payer: Self-pay | Admitting: Family

## 2016-03-23 ENCOUNTER — Other Ambulatory Visit: Payer: Self-pay | Admitting: Physician Assistant

## 2016-03-29 ENCOUNTER — Other Ambulatory Visit: Payer: Self-pay | Admitting: Family

## 2016-04-20 DIAGNOSIS — R739 Hyperglycemia, unspecified: Secondary | ICD-10-CM | POA: Insufficient documentation

## 2016-04-30 ENCOUNTER — Other Ambulatory Visit: Payer: Self-pay | Admitting: Family

## 2016-06-07 NOTE — Progress Notes (Signed)
Cardiology Office Note:    Date:  06/08/2016   ID:  Douglas Ballard, DOB [birth year], MRN [id number]  PCP:  Mauricio Po, FNP  Cardiologist:  Dr. Peter Martinique   Electrophysiologist:  N/a Nephrologist: Dr. Sindy Messing Urologist: Dr. Risa Grill  Referring MD: Golden Circle, FNP   Chief Complaint  Patient presents with  . Follow-up  . Chest Pain    History of Present Illness:     Douglas Ballard is a 65 y.o. male with a hx of PAF, malignant HTN, CKD, tobacco abuse, alcohol abuse, diabetes, colovesicular fistula. He was admitted in 8/15 with acute renal failure, sepsis, expressive aphasia, diastolic HF in AF with RVR. He developed acute delirium with alcohol withdrawal. He recovered from this and eventually had fistula repair. Echo demonstrated normal EF, moderate biatrial enlargement, mild LVH. Myoview demonstrated normal perfusion. He has been on Xarelto for anticoagulation. When seen in June 2017  he was back in atrial fibrillation with controlled ventricular rate.  He was seen in the ED in mid June with chest pain and hematuria. Cardiac enzymes were negative. D-dimer was negative.  Chest x-ray was unremarkable.   His heart rate was also in the 40s at times. He did undergo a Myoview study that showed normal perfusion and EF 50%.  Echo showed normal LV function.   On follow up today he is feeling very well. He is exercising regularly.  His nephrologist in [facility] is adjusting his medicines for his blood pressure. He has no edema now.  He denies any history of PND. He states he has almost quit drinking Etoh but is still drinking red wine. He has lost 6 lbs.   Past Medical History:  Diagnosis Date  . Alcohol withdrawal seizure ([place]) 06/28/2011   Alcohol withdraw seizure prior to admission is suspected from history given by family & Post ictal appearance in the ED.   Marland Kitchen Anxiety   . Atrial fibrillation ([place])   . Cancer [facility]) 2012   Prostate surgery  . Chronic diastolic CHF (congestive  heart failure) ([place]) 11/01/2014   Echo 8/15: Mild LVH, EF 50-55%, moderate BAE  //  b. Echo 7/17: EF 55-60%, normal wall motion, grade 2 diastolic dysfunction, MAC, mild MR, moderate LAE, trivial PI  . Compression fracture of thoracic vertebra ([place]) 06/26/2011  . Diabetes mellitus type 2 in nonobese (HCC)   . Fistula, bladder   . Frequent urination at night   . History of adenomatous polyp of colon 06/14/2014  . History of nuclear stress test    a. Myoview 10/15: Overall Impression: Low risk stress nuclear study demonstrating mild baseline ST-T changes with normal myocardial perfusion and low normal EF of 50%. // b.Myoview 7/17: EF 50%, no ischemia or scar, low risk (EF normal by recent echo)  . Hypertension   . PNA (pneumonia) 06/26/2011  . Sepsis due to urinary tract infection ([place]) 04/16/2014  . Stroke ([place]) sept 1, 2015   tia x 3    Past Surgical History:  Procedure Laterality Date  . COLON SURGERY    . COLONOSCOPY N/A 06/14/2014   Procedure: COLONOSCOPY;  Surgeon: Gatha Mayer, MD;  Location: [facility];  Service: Endoscopy;  Laterality: N/A;  . CYSTOSCOPY WITH STENT PLACEMENT Bilateral 06/15/2014   Procedure: CYSTOSCOPY WITH BILATERAL STENT PLACEMENT;  Surgeon: Bernestine Amass, MD;  Location: [facility];  Service: Urology;  Laterality: Bilateral;  . LIPOMA EXCISION  2012   Dr Nonah Mattes  . moles removed  Back  . PROCTOSCOPY N/A 06/15/2014   Procedure: RIGID PROCTOSCOPY;  Surgeon: Michael Boston, MD;  Location: [facility];  Service: General;  Laterality: N/A;  . RADIOLOGY WITH ANESTHESIA N/A 07/28/2014   Procedure: Embolization;  Surgeon: Luanne Bras, MD;  Location: [place];  Service: Radiology;  Laterality: N/A;  . ROBOT ASSISTED LAPAROSCOPIC RADICAL PROSTATECTOMY  01/31/2011   Robotic-assisted laparoscopic radical retropubic    Current Medications: Outpatient Medications Prior to Visit  Medication Sig Dispense Refill  . diltiazem (CARDIZEM CD) 240 MG 24 hr capsule TAKE 1  CAPSULE EVERY       MORNING 90 capsule 1  . furosemide (LASIX) 20 MG tablet Take 1 tablet (20 mg total) by mouth every Monday, Wednesday, and Friday. 30 tablet 11  . isosorbide-hydrALAZINE (BIDIL) 20-37.5 MG tablet Take 2 tablets by mouth 2 (two) times daily.     Marland Kitchen JANUVIA 100 MG tablet TAKE 1 TABLET DAILY 30 tablet 2  . labetalol (NORMODYNE) 200 MG tablet Take 2 tablets (400 mg total) by mouth 3 (three) times daily.    . Multiple Vitamins-Minerals (MULTIVITAMINS THER. W/MINERALS) TABS Take 1 tablet by mouth every morning.     . olmesartan (BENICAR) 40 MG tablet Take 1 tablet (40 mg total) by mouth every morning. 90 tablet 0  . sitaGLIPtin (JANUVIA) 100 MG tablet Take 1 tablet (100 mg total) by mouth daily. 30 tablet 2  . Vitamin D, Ergocalciferol, (DRISDOL) 50000 units CAPS capsule Take 50,000 Units by mouth once a week.  0  . zolpidem (AMBIEN) 5 MG tablet Take 1 tablet (5 mg total) by mouth at bedtime as needed for sleep. 90 tablet 0  . rivaroxaban (XARELTO) 20 MG TABS tablet take 1 tablet by mouth once daily WITH SUPPER 30 tablet 0  . XARELTO 20 MG TABS tablet TAKE 1 TABLET DAILY WITH   SUPPER 90 tablet 1   No facility-administered medications prior to visit.       Allergies:   Hydrochlorothiazide; Lasix [furosemide]; Nsaids; and Sulfa antibiotics   Social History   Social History  . Marital status: Married    Spouse name: N/A  . Number of children: 2  . Years of education: N/A   Occupational History  . 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 Smoking status: Former Smoker    Packs/day: 2.00    Years: 25.00    Types: Cigarettes    Quit date: 04/14/2014  . Smokeless tobacco: Never Used  . Alcohol use 4.2 - 6.0 oz/week    7 - 10 Standard drinks or equivalent per week     Comment: 2 bottles of wine with wife a week; cocktail every other night  . Drug use: No  . Sexual activity: Not Currently   Other Topics Concern  . None   Social History Narrative  .  None     Family History:  The patient's family history includes Atrial fibrillation in his father; Breast cancer in his mother; Colon polyps in his brother; Leukemia in his brother; Lung cancer in his mother; Prostate cancer in his father.   ROS:   Please see the history of present illness.    Review of Systems  Cardiovascular: Negative for irregular heartbeat and leg swelling.  Respiratory: Negative for shortness of breath.   Gastrointestinal: Negative for hematochezia.  Genitourinary: Negative for hematuria.  Neurological: Negative for dizziness.   All other systems reviewed and are negative.   Physical Exam:    VS:  BP 120/72 (BP Location: Right Arm, Patient Position: Sitting, Cuff Size: Normal)   Pulse 70   Ht 5\' 9"  (1.753 m)   Wt 184 lb 3.2 oz (83.6 kg)   SpO2 96%   BMI 27.20 kg/m    Physical Exam  Constitutional: He is oriented to person, place, and time. He appears well-developed and well-nourished.  HENT:  Head: Normocephalic and atraumatic.  Neck: Normal range of motion. No JVD present.  Cardiovascular: Normal rate, regular rhythm, S1 normal and S2 normal.  Exam reveals no gallop and no friction rub.   No murmur heard. Pulmonary/Chest: Effort normal and breath sounds normal. He has no wheezes. He has no rhonchi. He has no rales.  Abdominal: Soft. He exhibits no distension. There is no tenderness.  Musculoskeletal: Normal range of motion.  Trace bilateral LE edema  Neurological: He is alert and oriented to person, place, and time.  Skin: Skin is warm and dry.  Psychiatric: He has a normal mood and affect.    Wt Readings from Last 3 Encounters:  06/08/16 184 lb 3.2 oz (83.6 kg)  03/08/16 192 lb (87.1 kg)  02/08/16 190 lb (86.2 kg)      Studies/Labs Reviewed:     EKG:  EKG is  Not  ordered today.    Recent Labs: 09/19/2015: ALT 10 09/30/2015: TSH 1.25 02/06/2016: BUN 23; Creatinine, Ser 1.92; Hemoglobin 12.6; Platelets 277; Potassium 5.1; Sodium  126 02/07/2016: Brain Natriuretic Peptide 395.1   Recent Lipid Panel    Component Value Date/Time   CHOL 81 04/22/2014 0141   TRIG 52 04/22/2014 0141   HDL 41 04/22/2014 0141   CHOLHDL 2.0 04/22/2014 0141   VLDL 10 04/22/2014 0141   LDLCALC 30 04/22/2014 0141    Additional studies/ records that were reviewed today include:   Labs from 6/31/17: sodium 126, BUN24, creatinine 2.41. Estimated GFR 27. Hgb 13.9.  Myoview 03/08/16 Study Highlights    Nuclear stress EF: 50%.  There was no ST segment deviation noted during stress.  This is a low risk study.  The left ventricular ejection fraction is mildly decreased (45-54%).   Low risk stress nuclear study with no ischemia or infarction; EF 50 with mild global hypokinesis and mild LVE; suggest echo to further assess LV function; patient in atrial fibrillation during the study.      Echo 02/24/16: Study Conclusions  - Left ventricle: The cavity size was normal. Systolic function was   normal. The estimated ejection fraction was in the range of 55%   to 60%. Wall motion was normal; there were no regional wall   motion abnormalities. Features are consistent with a pseudonormal   left ventricular filling pattern, with concomitant abnormal   relaxation and increased filling pressure (grade 2 diastolic   dysfunction). Doppler parameters are consistent with high   ventricular filling pressure. - Mitral valve: Calcified annulus. There was mild regurgitation. - Left atrium: The atrium was moderately dilated. - Right ventricle: The cavity size was mildly dilated. Wall   thickness was normal. - Pulmonic valve: There was trivial regurgitation.   ASSESSMENT:     1. Chronic diastolic CHF (congestive heart failure) ([place])   2. Essential hypertension   3. Paroxysmal atrial fibrillation (HCC)   4. CKD (chronic kidney disease) stage 3, GFR 30-59 ml/min     PLAN:     In order of problems listed above:  1. Chest pain - resolved. He  has typical and atypical features. ECG is unchanged. He has risk  factors which include hypertension, diabetes and prior smoking. Myoview normal in July 2017.   2. Chronic diastolic CHF. Appears well compensated today. On lasix 20 mg daily. Sodium restriction.  3. Paroxysmal atrial fibrillation -  Appears to have a regular rhythm today. Based on renal function he should be on a lower dose of Xarelto. Will reduce to 15 mg daily.  4. HTN - His blood pressure is well controlled. His nephrologist has been adjusting his medications. He plans to see his nephrologist again in the near future. I will defer any further medication adjustments to his nephrologist.    5. CKD - stage 3-4. Followed by nephrology.     Medication Adjustments/Labs and Tests Ordered: Current medicines are reviewed at length with the patient today.  Concerns regarding medicines are outlined above.  Medication changes, Labs and Tests ordered today are outlined in the Patient Instructions noted below. Patient Instructions  We will reduce Xarelto to 15 mg daily  Continue your other therapy  I will see you in 6 months.  Signed, Peter Martinique, MD  06/08/2016 10:15 AM    [address], [place], [place]  [address] Phone: [phone]; Fax: [phone]

## 2016-06-08 ENCOUNTER — Encounter: Payer: Self-pay | Admitting: Cardiology

## 2016-06-08 ENCOUNTER — Ambulatory Visit (INDEPENDENT_AMBULATORY_CARE_PROVIDER_SITE_OTHER): Payer: BC Managed Care – PPO | Admitting: Cardiology

## 2016-06-08 VITALS — BP 120/72 | HR 70 | Ht 69.0 in | Wt 184.2 lb

## 2016-06-08 DIAGNOSIS — I480 Paroxysmal atrial fibrillation: Secondary | ICD-10-CM

## 2016-06-08 DIAGNOSIS — I10 Essential (primary) hypertension: Secondary | ICD-10-CM | POA: Diagnosis not present

## 2016-06-08 DIAGNOSIS — N183 Chronic kidney disease, stage 3 unspecified: Secondary | ICD-10-CM

## 2016-06-08 DIAGNOSIS — I5032 Chronic diastolic (congestive) heart failure: Secondary | ICD-10-CM | POA: Diagnosis not present

## 2016-06-08 MED ORDER — RIVAROXABAN 15 MG PO TABS: 15.0000 mg | ORAL_TABLET | Freq: Every day | ORAL | 3 refills | Status: DC

## 2016-06-08 NOTE — Patient Instructions (Signed)
We will reduce Xarelto to 15 mg daily  Continue your other therapy  I will see you in 6 months.

## 2016-06-10 ENCOUNTER — Other Ambulatory Visit: Payer: Self-pay | Admitting: Family

## 2016-06-14 ENCOUNTER — Other Ambulatory Visit: Payer: Self-pay | Admitting: Physician Assistant

## 2016-06-18 ENCOUNTER — Other Ambulatory Visit: Payer: Self-pay | Admitting: Family

## 2016-06-18 DIAGNOSIS — G4700 Insomnia, unspecified: Secondary | ICD-10-CM

## 2016-06-19 NOTE — Telephone Encounter (Signed)
Last refill was 02/08/16

## 2016-06-27 ENCOUNTER — Other Ambulatory Visit: Payer: Self-pay

## 2016-07-14 ENCOUNTER — Other Ambulatory Visit: Payer: Self-pay | Admitting: Family

## 2016-07-26 ENCOUNTER — Other Ambulatory Visit: Payer: Self-pay | Admitting: Physician Assistant

## 2016-08-27 ENCOUNTER — Encounter: Payer: Self-pay | Admitting: Family

## 2016-08-27 ENCOUNTER — Other Ambulatory Visit (INDEPENDENT_AMBULATORY_CARE_PROVIDER_SITE_OTHER): Payer: BC Managed Care – PPO

## 2016-08-27 ENCOUNTER — Ambulatory Visit (INDEPENDENT_AMBULATORY_CARE_PROVIDER_SITE_OTHER): Payer: BC Managed Care – PPO | Admitting: Family

## 2016-08-27 VITALS — BP 158/94 | HR 63 | Temp 98.1°F | Resp 16 | Ht 69.0 in | Wt 186.0 lb

## 2016-08-27 DIAGNOSIS — E119 Type 2 diabetes mellitus without complications: Secondary | ICD-10-CM | POA: Diagnosis not present

## 2016-08-27 LAB — HEMOGLOBIN A1C: Hgb A1c MFr Bld: 7.1 % — ABNORMAL HIGH (ref 4.6–6.5)

## 2016-08-27 LAB — MICROALBUMIN / CREATININE URINE RATIO
Creatinine,U: 100 mg/dL
Microalb Creat Ratio: 93.7 mg/g — ABNORMAL HIGH (ref 0.0–30.0)
Microalb, Ur: 93.7 mg/dL — ABNORMAL HIGH (ref 0.0–1.9)

## 2016-08-27 NOTE — Progress Notes (Signed)
Subjective:    Patient ID: Douglas Ballard, male    DOB: [birth year], 65 y.o.   MRN: [id number]  Chief Complaint  Patient presents with  . Follow-up    diabetes    HPI:  Douglas Ballard is a 66 y.o. male who  has a past medical history of Alcohol withdrawal seizure ([place]) (06/28/2011); Anxiety; Atrial fibrillation ([place]); Cancer ([place]) (2012); Chronic diastolic CHF (congestive heart failure) ([place]) (11/01/2014); Compression fracture of thoracic vertebra (HCC) (06/26/2011); Diabetes mellitus type 2 in nonobese [facility]); Fistula, bladder; Frequent urination at night; History of adenomatous polyp of colon (06/14/2014); History of nuclear stress test; Hypertension; PNA (pneumonia) (06/26/2011); Sepsis due to urinary tract infection ([place]) (04/16/2014); and Stroke ([place]) (sept 1, 2015). and presents today for an office visit.   Diabetes - Not currently maintained on medication. Denies excessive hunger, thirst or urination. No new symptoms of end organ damage. Recently completed diabetic eye exam was normal. Most recent A1c was 5.7. Due for diabetic foot exam and urine microalbumin. Pneumovax is up-to-date.   Allergies  Allergen Reactions  . Hydrochlorothiazide Other (See Comments)    Pt gets hyponatremia  . Lasix [Furosemide] Other (See Comments)    Sodium levels drop when take  . Nsaids Other (See Comments)    Kidney disease/failure  . Sulfa Antibiotics Other (See Comments)    headaches      Outpatient Medications Prior to Visit  Medication Sig Dispense Refill  . diltiazem (CARDIZEM CD) 240 MG 24 hr capsule TAKE 1 CAPSULE EVERY       MORNING 90 capsule 1  . furosemide (LASIX) 20 MG tablet Take 1 tablet (20 mg total) by mouth every Monday, Wednesday, and Friday. 30 tablet 11  . isosorbide-hydrALAZINE (BIDIL) 20-37.5 MG tablet Take 2 tablets by mouth 2 (two) times daily.     Marland Kitchen labetalol (NORMODYNE) 200 MG tablet Take 2 tablets (400 mg total) by mouth 3 (three) times daily.    . Multiple  Vitamins-Minerals (MULTIVITAMINS THER. W/MINERALS) TABS Take 1 tablet by mouth every morning.     . olmesartan (BENICAR) 40 MG tablet Take 1 tablet (40 mg total) by mouth every morning. 90 tablet 0  . Rivaroxaban (XARELTO) 15 MG TABS tablet Take 1 tablet (15 mg total) by mouth daily with supper. 90 tablet 3  . sitaGLIPtin (JANUVIA) 100 MG tablet Take 1 tablet (100 mg total) by mouth daily. 30 tablet 2  . Vitamin D, Ergocalciferol, (DRISDOL) 50000 units CAPS capsule Take 50,000 Units by mouth once a week.  0  . XARELTO 20 MG TABS tablet TAKE 1 TABLET DAILY WITH   SUPPER 90 tablet 2  . zolpidem (AMBIEN) 5 MG tablet take 1 tablet by mouth at bedtime if needed for sleep 90 tablet 0  . diltiazem (CARDIZEM CD) 240 MG 24 hr capsule TAKE 1 CAPSULE EVERY       MORNING 90 capsule 0  . JANUVIA 100 MG tablet TAKE 1 TABLET DAILY 30 tablet 2   No facility-administered medications prior to visit.      Review of Systems  Eyes:       Negative for changes in vision.  Respiratory: Negative for chest tightness and shortness of breath.   Cardiovascular: Negative for chest pain, palpitations and leg swelling.  Endocrine: Negative for polydipsia, polyphagia and polyuria.  Neurological: Negative for dizziness, weakness, light-headedness and headaches.      Objective:    BP (!) 158/94 (BP Location: Left Arm, Patient Position: Sitting, Cuff Size: Normal)  Pulse 63   Temp 98.1 F (36.7 C) (Oral)   Resp 16   Ht 5\' 9"  (1.753 m)   Wt 186 lb (84.4 kg)   SpO2 97%   BMI 27.47 kg/m  Nursing note and vital signs reviewed.  Physical Exam  Constitutional: He is oriented to person, place, and time. He appears well-developed and well-nourished. No distress.  Cardiovascular: Normal rate, regular rhythm, normal heart sounds and intact distal pulses.   Pulmonary/Chest: Effort normal and breath sounds normal.  Neurological: He is alert and oriented to person, place, and time.  Diabetic foot exam - bilateral feet are  free from skin breakdown, cuts, and abrasions. Toenails are neatly trimmed. Pulses are intact and appropriate. Sensation is intact to monofilament bilaterally.  Skin: Skin is warm and dry.  Psychiatric: He has a normal mood and affect. His behavior is normal. Judgment and thought content normal.       Assessment & Plan:   Problem List Items Addressed This Visit      Endocrine   Diabetes mellitus type 2 in nonobese ([place]) - Primary    Type 2 diabetes with drug holiday and lifestyle management. Diabetic foot exam completed today. Obtain A1c and urine microalbumin. Pneumovax is up-to-date. Not currently checking blood sugars at home. Continue with lifestyle management pending A1c results.       Relevant Orders   Hemoglobin A1c (Completed)   Urine Microalbumin w/creat. ratio (Completed)       I have discontinued [name]. I am also having him maintain his multivitamins ther. w/minerals, Vitamin D (Ergocalciferol), isosorbide-hydrALAZINE, olmesartan, sitaGLIPtin, furosemide, labetalol, diltiazem, Rivaroxaban, zolpidem, and XARELTO.   Follow-up: Return in about 3 months (around 11/25/2016), or if symptoms worsen or fail to improve.  Mauricio Po, FNP

## 2016-08-27 NOTE — Assessment & Plan Note (Signed)
Type 2 diabetes with drug holiday and lifestyle management. Diabetic foot exam completed today. Obtain A1c and urine microalbumin. Pneumovax is up-to-date. Not currently checking blood sugars at home. Continue with lifestyle management pending A1c results.

## 2016-08-27 NOTE — Patient Instructions (Addendum)
Thank you for choosing [facility].  SUMMARY AND INSTRUCTIONS:  Medication:  Please continue to take your medication as prescribed.   Your prescription(s) have been submitted to your pharmacy or been printed and provided for you. Please take as directed and contact our office if you believe you are having problem(s) with the medication(s) or have any questions.  Labs:  Please stop by the lab on the lower level of the building for your blood work. Your results will be released to [facility] (or called to you) after review, usually within 72 hours after test completion. If any changes need to be made, you will be notified at that same time.  1.) The lab is open from 7:30am to 5:30 pm Monday-Friday 2.) No appointment is necessary 3.) Fasting (if needed) is 6-8 hours after food and drink; black coffee and water are okay   Follow up:  If your symptoms worsen or fail to improve, please contact our office for further instruction, or in case of emergency go directly to the emergency room at the closest medical facility.   DIABETES CARE INSTRUCTIONS:  Current A1c: 5.7  A1C Goal: <7.0%  Fasting Blood Sugar Goal: 80-130 Blood sugar check that you take after fasting for at least 8 hours which is usually in the morning before eating.  Post-prandial Blood Sugar Goal: <180 Blood sugar check approximately 1-2 hours after the start of a meal.   Diabetes Prevention Screens:  1.) Ensure you have an annual eye exam by an ophthalmologist or optometrist.   2,) Ensure you have an annual foot exam or when any changes are noted including new onset numbness/tingling or wounds. Check your feet daily!  3.) The American Diabetes Association recommends the Pneumovax vaccination against pneumonia every 5 years.  4.) We will check your kidney function with a urine test on an annual basis.

## 2016-10-05 ENCOUNTER — Other Ambulatory Visit: Payer: Self-pay | Admitting: Family

## 2016-10-05 ENCOUNTER — Encounter: Payer: Self-pay | Admitting: Family

## 2016-10-05 DIAGNOSIS — G4700 Insomnia, unspecified: Secondary | ICD-10-CM

## 2016-10-05 MED ORDER — SITAGLIPTIN PHOSPHATE 100 MG PO TABS: 100.0000 mg | ORAL_TABLET | Freq: Every day | ORAL | 3 refills | Status: DC

## 2016-10-05 MED ORDER — ZOLPIDEM TARTRATE 5 MG PO TABS: 5.0000 mg | ORAL_TABLET | Freq: Every evening | ORAL | 2 refills | Status: DC | PRN

## 2016-11-01 ENCOUNTER — Other Ambulatory Visit: Payer: Self-pay | Admitting: Family

## 2016-11-06 ENCOUNTER — Telehealth (HOSPITAL_COMMUNITY): Payer: Self-pay

## 2016-11-06 NOTE — Telephone Encounter (Signed)
Called to schedule f/u mri, left message for pt to return call. AW

## 2016-11-16 ENCOUNTER — Other Ambulatory Visit: Payer: Self-pay | Admitting: Family

## 2016-11-29 ENCOUNTER — Telehealth (HOSPITAL_COMMUNITY): Payer: Self-pay

## 2016-11-29 NOTE — Telephone Encounter (Signed)
Called to schedule f/u mri, left message for pt to return call. AW

## 2016-12-22 ENCOUNTER — Encounter: Payer: Self-pay | Admitting: Family

## 2016-12-23 MED ORDER — SITAGLIPTIN PHOSPHATE 100 MG PO TABS: 100.0000 mg | ORAL_TABLET | Freq: Every day | ORAL | 0 refills | Status: DC

## 2017-02-20 ENCOUNTER — Other Ambulatory Visit: Payer: Self-pay | Admitting: Physician Assistant

## 2017-02-20 DIAGNOSIS — N183 Chronic kidney disease, stage 3 unspecified: Secondary | ICD-10-CM

## 2017-02-20 DIAGNOSIS — I10 Essential (primary) hypertension: Secondary | ICD-10-CM

## 2017-02-20 DIAGNOSIS — I5032 Chronic diastolic (congestive) heart failure: Secondary | ICD-10-CM

## 2017-02-22 ENCOUNTER — Encounter: Payer: Self-pay | Admitting: Family

## 2017-02-22 ENCOUNTER — Other Ambulatory Visit: Payer: Self-pay | Admitting: Physician Assistant

## 2017-02-22 DIAGNOSIS — F5101 Primary insomnia: Secondary | ICD-10-CM

## 2017-02-22 MED ORDER — LABETALOL HCL 200 MG PO TABS: 400.0000 mg | ORAL_TABLET | Freq: Three times a day (TID) | ORAL | 0 refills | Status: DC

## 2017-02-28 MED ORDER — ZOLPIDEM TARTRATE 10 MG PO TABS: 10.0000 mg | ORAL_TABLET | Freq: Every evening | ORAL | 1 refills | Status: DC | PRN

## 2017-03-01 NOTE — Telephone Encounter (Signed)
Greg printed zolpidem script, but did not sign, and he is not in the office today. Called [facility]. Sent pt msg back stating [facility]!

## 2017-03-20 ENCOUNTER — Encounter: Payer: Self-pay | Admitting: Cardiology

## 2017-03-20 MED ORDER — RIVAROXABAN 15 MG PO TABS: 15.0000 mg | ORAL_TABLET | Freq: Every day | ORAL | 1 refills | Status: DC

## 2017-04-05 ENCOUNTER — Telehealth: Payer: Self-pay | Admitting: Family

## 2017-04-05 NOTE — Telephone Encounter (Signed)
Pharmacy called stating the Pt needs a PA on zolpidem (AMBIEN) 10 MG tablet

## 2017-04-08 NOTE — Telephone Encounter (Signed)
PA approved. Pharmacy is aware.

## 2017-04-08 NOTE — Telephone Encounter (Signed)
PA for Lorrin Mais has been initiated.   EYC:[id number]

## 2017-04-19 ENCOUNTER — Encounter: Payer: Self-pay | Admitting: Family

## 2017-04-19 ENCOUNTER — Emergency Department (HOSPITAL_COMMUNITY): Payer: BC Managed Care – PPO

## 2017-04-19 ENCOUNTER — Ambulatory Visit (INDEPENDENT_AMBULATORY_CARE_PROVIDER_SITE_OTHER): Payer: BC Managed Care – PPO | Admitting: Family

## 2017-04-19 ENCOUNTER — Encounter (HOSPITAL_COMMUNITY): Payer: Self-pay | Admitting: Emergency Medicine

## 2017-04-19 ENCOUNTER — Emergency Department (HOSPITAL_COMMUNITY)
Admission: EM | Admit: 2017-04-19 | Discharge: 2017-04-19 | Disposition: A | Discharge status: Home / Self Care | Payer: BC Managed Care – PPO | Attending: Emergency Medicine | Admitting: Emergency Medicine

## 2017-04-19 VITALS — BP 210/120 | HR 110 | Temp 98.9°F | Resp 20 | Ht 69.0 in

## 2017-04-19 DIAGNOSIS — S0011XA Contusion of right eyelid and periocular area, initial encounter: Secondary | ICD-10-CM | POA: Diagnosis not present

## 2017-04-19 DIAGNOSIS — S0012XA Contusion of left eyelid and periocular area, initial encounter: Secondary | ICD-10-CM | POA: Diagnosis not present

## 2017-04-19 DIAGNOSIS — Z7901 Long term (current) use of anticoagulants: Secondary | ICD-10-CM | POA: Diagnosis not present

## 2017-04-19 DIAGNOSIS — S2239XA Fracture of one rib, unspecified side, initial encounter for closed fracture: Secondary | ICD-10-CM | POA: Insufficient documentation

## 2017-04-19 DIAGNOSIS — S2242XA Multiple fractures of ribs, left side, initial encounter for closed fracture: Secondary | ICD-10-CM

## 2017-04-19 DIAGNOSIS — Y999 Unspecified external cause status: Secondary | ICD-10-CM | POA: Insufficient documentation

## 2017-04-19 DIAGNOSIS — I5032 Chronic diastolic (congestive) heart failure: Secondary | ICD-10-CM | POA: Diagnosis not present

## 2017-04-19 DIAGNOSIS — W19XXXA Unspecified fall, initial encounter: Secondary | ICD-10-CM | POA: Insufficient documentation

## 2017-04-19 DIAGNOSIS — Y9289 Other specified places as the place of occurrence of the external cause: Secondary | ICD-10-CM | POA: Insufficient documentation

## 2017-04-19 DIAGNOSIS — E119 Type 2 diabetes mellitus without complications: Secondary | ICD-10-CM | POA: Diagnosis not present

## 2017-04-19 DIAGNOSIS — W1830XA Fall on same level, unspecified, initial encounter: Secondary | ICD-10-CM | POA: Insufficient documentation

## 2017-04-19 DIAGNOSIS — N183 Chronic kidney disease, stage 3 (moderate): Secondary | ICD-10-CM | POA: Insufficient documentation

## 2017-04-19 DIAGNOSIS — S022XXA Fracture of nasal bones, initial encounter for closed fracture: Secondary | ICD-10-CM | POA: Diagnosis not present

## 2017-04-19 DIAGNOSIS — Z7984 Long term (current) use of oral hypoglycemic drugs: Secondary | ICD-10-CM | POA: Insufficient documentation

## 2017-04-19 DIAGNOSIS — S0990XA Unspecified injury of head, initial encounter: Secondary | ICD-10-CM | POA: Diagnosis present

## 2017-04-19 DIAGNOSIS — S2232XA Fracture of one rib, left side, initial encounter for closed fracture: Secondary | ICD-10-CM | POA: Insufficient documentation

## 2017-04-19 DIAGNOSIS — Y9389 Activity, other specified: Secondary | ICD-10-CM | POA: Insufficient documentation

## 2017-04-19 DIAGNOSIS — I130 Hypertensive heart and chronic kidney disease with heart failure and stage 1 through stage 4 chronic kidney disease, or unspecified chronic kidney disease: Secondary | ICD-10-CM | POA: Insufficient documentation

## 2017-04-19 DIAGNOSIS — S0033XA Contusion of nose, initial encounter: Secondary | ICD-10-CM | POA: Diagnosis not present

## 2017-04-19 DIAGNOSIS — S0083XA Contusion of other part of head, initial encounter: Secondary | ICD-10-CM

## 2017-04-19 MED ORDER — ONDANSETRON 8 MG PO TBDP
8.0000 mg | ORAL_TABLET | Freq: Once | ORAL | Status: AC
  Administered 2017-04-19: 8 mg via ORAL
  Filled 2017-04-19: qty 1

## 2017-04-19 MED ORDER — OXYCODONE-ACETAMINOPHEN 5-325 MG PO TABS: 1.0000 | ORAL_TABLET | ORAL | 0 refills | Status: DC | PRN

## 2017-04-19 MED ORDER — OXYCODONE-ACETAMINOPHEN 5-325 MG PO TABS
2.0000 | ORAL_TABLET | Freq: Once | ORAL | Status: AC
  Administered 2017-04-19: 2 via ORAL
  Filled 2017-04-19: qty 2

## 2017-04-19 MED ORDER — ONDANSETRON 8 MG PO TBDP: 8.0000 mg | ORAL_TABLET | Freq: Three times a day (TID) | ORAL | 0 refills | Status: DC | PRN

## 2017-04-19 NOTE — Patient Instructions (Signed)
Thank you for choosing [facility].  SUMMARY AND INSTRUCTIONS:  Recommend going over to the ED because of the amount of nausea and vomiting to rule out any brain bleed secondary to the Xarelto.   Also need multiple imaging which can be completed all at once.   Follow up:  If your symptoms worsen or fail to improve, please contact our office for further instruction, or in case of emergency go directly to the emergency room at the closest medical facility.

## 2017-04-19 NOTE — ED Provider Notes (Signed)
[facility] DEPT Provider Note   CSN: [id number] Arrival date & time: 04/19/17  1139     History   Chief Complaint Chief Complaint  Patient presents with  . Fall    HPI Fischer Stage is a 66 y.o. male.  HPI Patient presents to the emergency department from his primary care doctor's office after being seen for a fall which occurred 2 nights ago.  He tripped over his dog and fell down onto linoleum and struck his left chest and his face.  He is on chronic anticoagulation for paroxysmal atrial fibrillation.  He presents with significant swelling of his face and ongoing left lateral chest discomfort without shortness of breath.  Denies abdominal pain.  Denies neck pain.  No back pain.  Majority of pain is located in his left chest and worse with palpation.  No trismus or malocclusion.  Some nausea without vomiting.  Pain is mild to moderate in severity   Past Medical History:  Diagnosis Date  . Alcohol withdrawal seizure ([place]) 06/28/2011   Alcohol withdraw seizure prior to admission is suspected from history given by family & Post ictal appearance in the ED.   Marland Kitchen Anxiety   . Atrial fibrillation ([place])   . Cancer [facility]) 2012   Prostate surgery  . Chronic diastolic CHF (congestive heart failure) ([place]) 11/01/2014   Echo 8/15: Mild LVH, EF 50-55%, moderate BAE  //  b. Echo 7/17: EF 55-60%, normal wall motion, grade 2 diastolic dysfunction, MAC, mild MR, moderate LAE, trivial PI  . Compression fracture of thoracic vertebra ([place]) 06/26/2011  . Diabetes mellitus type 2 in nonobese (HCC)   . Fistula, bladder   . Frequent urination at night   . History of adenomatous polyp of colon 06/14/2014  . History of nuclear stress test    a. Myoview 10/15: Overall Impression: Low risk stress nuclear study demonstrating mild baseline ST-T changes with normal myocardial perfusion and low normal EF of 50%. // b.Myoview 7/17: EF 50%, no ischemia or scar, low risk (EF normal by recent echo)  . Hypertension    . PNA (pneumonia) 06/26/2011  . Sepsis due to urinary tract infection ([place]) 04/16/2014  . Stroke ([place]) sept 1, 2015   tia x 3    Patient Active Problem List   Diagnosis Date Noted  . Chest tightness 02/08/2016  . Hematuria 02/08/2016  . CKD (chronic kidney disease) stage 3, GFR 30-59 ml/min 02/07/2016  . Insomnia 09/19/2015  . Coarse tremors 09/19/2015  . Chronic diastolic CHF (congestive heart failure) ([place]) 11/01/2014  . Diabetes mellitus type 2 in nonobese (HCC)   . History of adenomatous polyp of colon 06/14/2014  . Paroxysmal atrial fibrillation ([place]) 05/19/2014  . Elevated bilirubin 04/14/2014  . 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 Alcohol abuse 06/27/2011  . Tobacco abuse 06/27/2011  . HTN (hypertension) 06/26/2011  . Personal history of prostate cancer s/p prostatectomy 2012 06/26/2011    Past Surgical History:  Procedure Laterality Date  . COLON SURGERY    . COLONOSCOPY N/A 06/14/2014   Procedure: COLONOSCOPY;  Surgeon: Gatha Mayer, MD;  [facility];  Service: Endoscopy;  Laterality: N/A;  . CYSTOSCOPY WITH STENT PLACEMENT Bilateral 06/15/2014   Procedure: CYSTOSCOPY WITH BILATERAL STENT PLACEMENT;  Surgeon: Bernestine Amass, MD;  Location: [facility] ORS;  Service: Urology;  Laterality: Bilateral;  . LIPOMA EXCISION  2012   Dr Nonah Mattes  . moles removed     Back  . PROCTOSCOPY N/A 06/15/2014  Procedure: RIGID PROCTOSCOPY;  Surgeon: Michael Boston, MD;  Location: [facility];  Service: General;  Laterality: N/A;  . RADIOLOGY WITH ANESTHESIA N/A 07/28/2014   Procedure: Embolization;  Surgeon: Luanne Bras, MD;  Location: [facility];  Service: Radiology;  Laterality: N/A;  . ROBOT ASSISTED LAPAROSCOPIC RADICAL PROSTATECTOMY  01/31/2011   Robotic-assisted laparoscopic radical retropubic       Home Medications    Prior to Admission medications   Medication Sig Start Date End Date Taking? Authorizing Provider  furosemide (LASIX) 20 MG tablet  TAKE 1 TABLET BY MOUTH EVERY MONDAY, [facility] AND FRIDAY 02/21/17  Yes Martinique, Peter M, MD  isosorbide-hydrALAZINE (BIDIL) 20-37.5 MG tablet Take 2 tablets by mouth 2 (two) times daily.    Yes [provider]  sitaGLIPtin (JANUVIA) 100 MG tablet Take 1 tablet (100 mg total) by mouth daily. 12/23/16  Yes Golden Circle, FNP  zolpidem (AMBIEN) 10 MG tablet Take 1 tablet (10 mg total) by mouth at bedtime as needed for sleep. 02/28/17  Yes Golden Circle, FNP  diltiazem (CARDIZEM CD) 240 MG 24 hr capsule TAKE 1 CAPSULE EVERY       MORNING Patient not taking: Reported on 04/19/2017 04/30/16   Golden Circle, FNP  diltiazem (CARDIZEM CD) 240 MG 24 hr capsule TAKE 1 CAPSULE EVERY       MORNING 11/19/16   Golden Circle, FNP  labetalol (NORMODYNE) 200 MG tablet Take 2 tablets (400 mg total) by mouth 3 (three) times daily. 02/22/17   Martinique, Peter M, MD  Multiple Vitamins-Minerals (MULTIVITAMINS THER. W/MINERALS) TABS Take 1 tablet by mouth every morning.     [provider]  olmesartan (BENICAR) 40 MG tablet Take 1 tablet (40 mg total) by mouth every morning. Patient not taking: Reported on 04/19/2017 02/08/16   Golden Circle, FNP  ondansetron (ZOFRAN ODT) 8 MG disintegrating tablet Take 1 tablet (8 mg total) by mouth every 8 (eight) hours as needed for nausea or vomiting. 04/19/17   Jola Schmidt, MD  oxyCODONE-acetaminophen (PERCOCET/ROXICET) 5-325 MG tablet Take 1 tablet by mouth every 4 (four) hours as needed for severe pain. 04/19/17   Jola Schmidt, MD  Rivaroxaban (XARELTO) 15 MG TABS tablet Take 1 tablet (15 mg total) by mouth daily with supper. 03/20/17   Martinique, Peter M, MD    Family History Family History  Problem Relation Age of Onset  . Atrial fibrillation Father        onset 68s. Had pacemaker placed for sinus pause  . Prostate cancer Father   . Breast cancer Mother   . Lung cancer Mother   . Leukemia Brother   . Colon polyps Brother   . Colon cancer Neg Hx   .  Diabetes Neg Hx     Social History Social History  Substance Use Topics  . Smoking status: Former Smoker    Packs/day: 2.00    Years: 25.00    Types: Cigarettes    Quit date: 04/14/2014  . Smokeless tobacco: Never Used  . Alcohol use 4.2 - 6.0 oz/week    7 - 10 Standard drinks or equivalent per week     Comment: 2 bottles of wine with wife a week; cocktail every other night     Allergies   Hydrochlorothiazide; Lasix [furosemide]; Nsaids; and Sulfa antibiotics   Review of Systems Review of Systems  All other systems reviewed and are negative.    Physical Exam Updated Vital Signs BP (!) 202/141   Pulse Marland Kitchen)  119   Resp 20   Ht _0  (1.753 m)   Wt 83 kg (183 lb)   SpO2 95%   BMI 27.02 kg/m   Physical Exam  Constitutional: He is oriented to person, place, and time. He appears well-developed and well-nourished.  HENT:  Significant ecchymosis of his bilateral periorbital regions in his nose with some associated swelling.  X-ray movements are normal.  No trismus or malocclusion.  No obvious dental trauma noted  Eyes: EOM are normal.  Neck: Normal range of motion. Neck supple.  C-spine nontender.  C-spine cleared by Nexus criteria.  Cardiovascular: Normal rate, regular rhythm, normal heart sounds and intact distal pulses.   Pulmonary/Chest: Effort normal and breath sounds normal. No respiratory distress. He exhibits tenderness.  Abdominal: Soft. He exhibits no distension. There is no tenderness.  Musculoskeletal: Normal range of motion.  Neurological: He is alert and oriented to person, place, and time.  Skin: Skin is warm and dry.  Psychiatric: He has a normal mood and affect. Judgment normal.  Nursing note and vitals reviewed.    ED Treatments / Results  Labs (all labs ordered are listed, but only abnormal results are displayed) Labs Reviewed - No data to display  EKG  EKG Interpretation None       Radiology Dg Ribs Unilateral W/chest Left  Result Date:  04/19/2017 CLINICAL DATA:  Golden Circle over dog 2 nights ago at home and struck floor on LEFT side, moderate to severe LEFT anterior rib pain EXAM: LEFT RIBS AND CHEST - 3+ VIEW COMPARISON:  Chest radiograph 02/06/2016 FINDINGS: Enlargement of cardiac silhouette. Mediastinal contours and pulmonary vascularity normal. Lungs clear. No pleural effusion or pneumothorax. BB placed at site of symptoms lower lateral LEFT chest. Bones appear slightly demineralized. Nondisplaced acute fracture at the lateral LEFT 7th rib. Questionable age indeterminate fracture of the anterior LEFT 8th rib. IMPRESSION: Acute fracture of the LEFT 7th rib. Enlargement of cardiac silhouette. Electronically Signed   By: Lavonia Dana M.D.   On: 04/19/2017 12:29   Ct Head Wo Contrast  Result Date: 04/19/2017 CLINICAL DATA:  Nasal and bilateral periorbital soft tissue swelling following a fall onto the face 2 nights ago. EXAM: CT HEAD WITHOUT CONTRAST CT MAXILLOFACIAL WITHOUT CONTRAST TECHNIQUE: Multidetector CT imaging of the head and maxillofacial structures were performed using the standard protocol without intravenous contrast. Multiplanar CT image reconstructions of the maxillofacial structures were also generated. COMPARISON:  Head CT dated 04/20/2014. FINDINGS: CT HEAD FINDINGS Brain: Diffusely enlarged ventricles and subarachnoid spaces. Patchy white matter low density in both cerebral hemispheres. No intracranial hemorrhage, mass lesion or CT evidence of acute infarction. Vascular: No hyperdense vessel or unexpected calcification. Skull: Normal. Negative for fracture or focal lesion. Other: None. CT MAXILLOFACIAL FINDINGS Osseous: No fractures seen. Orbits: Unremarkable. Sinuses: Minimal bilateral maxillary sinus mucosal thickening. Soft tissues: Nasal and bilateral periorbital soft tissue swelling. IMPRESSION: 1. No skull fracture or intracranial hemorrhage. 2. No maxillofacial fracture. 3. Mild diffuse cerebral and cerebellar atrophy. 4.  Interval mild chronic small vessel white matter ischemic changes in both cerebral hemispheres. Electronically Signed   By: Claudie Revering M.D.   On: 04/19/2017 12:45   Ct Maxillofacial Wo Contrast  Result Date: 04/19/2017 CLINICAL DATA:  Nasal and bilateral periorbital soft tissue swelling following a fall onto the face 2 nights ago. EXAM: CT HEAD WITHOUT CONTRAST CT MAXILLOFACIAL WITHOUT CONTRAST TECHNIQUE: Multidetector CT imaging of the head and maxillofacial structures were performed using the standard protocol without intravenous contrast. Multiplanar  CT image reconstructions of the maxillofacial structures were also generated. COMPARISON:  Head CT dated 04/20/2014. FINDINGS: CT HEAD FINDINGS Brain: Diffusely enlarged ventricles and subarachnoid spaces. Patchy white matter low density in both cerebral hemispheres. No intracranial hemorrhage, mass lesion or CT evidence of acute infarction. Vascular: No hyperdense vessel or unexpected calcification. Skull: Normal. Negative for fracture or focal lesion. Other: None. CT MAXILLOFACIAL FINDINGS Osseous: No fractures seen. Orbits: Unremarkable. Sinuses: Minimal bilateral maxillary sinus mucosal thickening. Soft tissues: Nasal and bilateral periorbital soft tissue swelling. IMPRESSION: 1. No skull fracture or intracranial hemorrhage. 2. No maxillofacial fracture. 3. Mild diffuse cerebral and cerebellar atrophy. 4. Interval mild chronic small vessel white matter ischemic changes in both cerebral hemispheres. Electronically Signed   By: Claudie Revering M.D.   On: 04/19/2017 12:45    Procedures Procedures (including critical care time)     +++++++++++++++++++++++++++++++++++++++++++++  Definitive Fracture Care  Definitive fracture care was performed for the patient's LEFT RIB FRACTURE. Treatment includes management of pain, incentive spirometry Fracture related discharge instructions were provided Symptomatic control measures provided to the  patient  +++++++++++++++++++++++++++++++++++++++++++++     Medications Ordered in ED Medications  oxyCODONE-acetaminophen (PERCOCET/ROXICET) 5-325 MG per tablet 2 tablet (2 tablets Oral Given 04/19/17 1159)  ondansetron (ZOFRAN-ODT) disintegrating tablet 8 mg (8 mg Oral Given 04/19/17 1257)     Initial Impression / Assessment and Plan / ED Course  I have reviewed the triage vital signs and the nursing notes.  Pertinent labs & imaging results that were available during my care of the patient were reviewed by me and considered in my medical decision making (see chart for details).     No facial fractures noted.  Definitive fracture care of left isolated rib fracture.  Pain controlled in the ER.  Nausea control.  Repeat abdominal exam without tenderness.  C-spine cleared by Nexus criteria.  Final Clinical Impressions(s) / ED Diagnoses   Final diagnoses:  Facial contusion, initial encounter  Closed fracture of one rib of left side, initial encounter    New Prescriptions New Prescriptions   ONDANSETRON (ZOFRAN ODT) 8 MG DISINTEGRATING TABLET    Take 1 tablet (8 mg total) by mouth every 8 (eight) hours as needed for nausea or vomiting.   OXYCODONE-ACETAMINOPHEN (PERCOCET/ROXICET) 5-325 MG TABLET    Take 1 tablet by mouth every 4 (four) hours as needed for severe pain.     Jola Schmidt, MD 04/19/17 [phone]

## 2017-04-19 NOTE — Assessment & Plan Note (Signed)
Crepitus of left rib 6-8 noted. Following assessment the patient vomited bilious emesis likely related to levels of pain. There is concern for possible intra-cranial pathology given mechanism. Refer to ED for further assessment and treatment.

## 2017-04-19 NOTE — ED Triage Notes (Signed)
Patient was sent from [facility] primary to r/o brain bleed, nasal fracture and left sided rib fractures. Pt fell 2 nights ago onto front of face. Pt has obvious swelling and bruising to bilateral eyes and nose. Pt is on Xarelto  Also reports not taking BP medication today.

## 2017-04-19 NOTE — Assessment & Plan Note (Signed)
Concern for nasal fracture given mechanism of injury. Does continue to experience bleeding likely related to Xarelto. Need to obtain x-rays. Ice for inflammation. Will refer to ED given mechanism of injury and use of anticoagulation.

## 2017-04-19 NOTE — Assessment & Plan Note (Signed)
Fall with questionable level of sobriety based on family report and significant mechanism with concern for head injury. Neurological exam is normal, however based on multiple episodes of vomiting and continued nausea it is recommended he proceed to the ED for further work up.

## 2017-04-19 NOTE — ED Notes (Signed)
ED Provider at bedside. 

## 2017-04-19 NOTE — ED Notes (Signed)
Bed: WA01 Expected date:  Expected time:  Means of arrival:  Comments: POV 12M R/O brain bleed

## 2017-04-19 NOTE — ED Notes (Signed)
MD at bedside. 

## 2017-04-19 NOTE — Progress Notes (Signed)
Subjective:    Patient ID: Douglas Ballard, male    DOB: [birth year], 66 y.o.   MRN: [id number]  Chief Complaint  Patient presents with  . Fall    had a fall on wednesday night, has 2 black eyes and nose pain and bruising, thinks he has broken ribs on left side     HPI:  Douglas Ballard is a 66 y.o. 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 and presents today for an acute office visit.  This is a new problem. Associated symptoms of nasal pain, bruising, and bleeding; and chest pain have been going on for about 36 hours following a fall down steps which he describes tripping over the dog. He did strike his head with no LOC. Daughter indicates that he had been drinking and had been taking another medication which she believes is Ambien. Continues to experience left sided rib pain. He is also on Xarelto for anticoagulation. He did vomit this morning and has not been able to sleep because of pain. No shortness of breath. Modifying factors include Tylenol. He did not take the Xarelto in the past 24 hours. No abdominal pain.    Allergies  Allergen Reactions  . Hydrochlorothiazide Other (See Comments)    Pt gets hyponatremia  . Lasix [Furosemide] Other (See Comments)    Sodium levels drop when take  . Nsaids Other (See Comments)    Kidney disease/failure  . Sulfa Antibiotics Other (See Comments)    headaches      Outpatient Medications Prior to Visit  Medication  Sig Dispense Refill  . diltiazem (CARDIZEM CD) 240 MG 24 hr capsule TAKE 1 CAPSULE EVERY       MORNING (Patient not taking: Reported on 04/19/2017) 90 capsule 1  . diltiazem (CARDIZEM CD) 240 MG 24 hr capsule TAKE 1 CAPSULE EVERY       MORNING 90 capsule 2  . furosemide (LASIX) 20 MG tablet TAKE 1 TABLET BY MOUTH EVERY MONDAY, WEDNESDAY AND FRIDAY 30 tablet 0  . isosorbide-hydrALAZINE (BIDIL) 20-37.5 MG tablet Take 2 tablets by mouth 2 (two) times daily.     Marland Kitchen labetalol (NORMODYNE) 200 MG tablet Take 2 tablets (400 mg total) by mouth 3 (three) times daily. 180 tablet 0  . Multiple Vitamins-Minerals (MULTIVITAMINS THER. W/MINERALS) TABS Take 1 tablet by mouth every morning.     . olmesartan (BENICAR) 40 MG tablet Take 1 tablet (40 mg total) by mouth every morning. (Patient not taking: Reported on 04/19/2017) 90 tablet 0  . Rivaroxaban (XARELTO) 15 MG TABS tablet Take 1 tablet (15 mg total) by mouth daily with supper. 90 tablet 1  . sitaGLIPtin (JANUVIA) 100 MG tablet Take 1 tablet (100 mg total) by mouth daily. 90 tablet 0  . zolpidem (AMBIEN) 10 MG tablet Take 1 tablet (10 mg total) by mouth at bedtime as needed for sleep. 30 tablet 1  . Vitamin D, Ergocalciferol, (DRISDOL) 50000 units CAPS capsule Take 50,000 Units by mouth once a week.  0   No facility-administered medications prior to visit.  Past Surgical History:  Procedure Laterality Date  . COLON SURGERY    . COLONOSCOPY N/A 06/14/2014   Procedure: COLONOSCOPY;  Surgeon: Gatha Mayer, MD;  [facility];  Service: Endoscopy;  Laterality: N/A;  . CYSTOSCOPY WITH STENT PLACEMENT Bilateral 06/15/2014   Procedure: CYSTOSCOPY WITH BILATERAL STENT PLACEMENT;  Surgeon: Bernestine Amass, MD;  [facility];  Service: Urology;  Laterality: Bilateral;  . LIPOMA EXCISION  2012   Dr Nonah Mattes  . moles removed     Back  . PROCTOSCOPY N/A 06/15/2014   Procedure: RIGID PROCTOSCOPY;  Surgeon: Michael Boston, MD;  [facility];   Service: General;  Laterality: N/A;  . RADIOLOGY WITH ANESTHESIA N/A 07/28/2014   Procedure: Embolization;  Surgeon: Luanne Bras, MD;  Location: [facility];  Service: Radiology;  Laterality: N/A;  . ROBOT ASSISTED LAPAROSCOPIC RADICAL PROSTATECTOMY  01/31/2011   Robotic-assisted laparoscopic radical retropubic      Past Medical History:  Diagnosis Date  . Alcohol withdrawal seizure ([place]) 06/28/2011   Alcohol withdraw seizure prior to admission is suspected from history given by family & Post ictal appearance in the ED.   Marland Kitchen Anxiety   . Atrial fibrillation ([place])   . Cancer [facility]) 2012   Prostate surgery  . Chronic diastolic CHF (congestive heart failure) ([place]) 11/01/2014   Echo 8/15: Mild LVH, EF 50-55%, moderate BAE  //  b. Echo 7/17: EF 55-60%, normal wall motion, grade 2 diastolic dysfunction, MAC, mild MR, moderate LAE, trivial PI  . Compression fracture of thoracic vertebra ([place]) 06/26/2011  . Diabetes mellitus type 2 in nonobese (HCC)   . Fistula, bladder   . Frequent urination at night   . History of adenomatous polyp of colon 06/14/2014  . History of nuclear stress test    a. Myoview 10/15: Overall Impression: Low risk stress nuclear study demonstrating mild baseline ST-T changes with normal myocardial perfusion and low normal EF of 50%. // b.Myoview 7/17: EF 50%, no ischemia or scar, low risk (EF normal by recent echo)  . Hypertension   . PNA (pneumonia) 06/26/2011  . Sepsis due to urinary tract infection ([place]) 04/16/2014  . Stroke ([place]) sept 1, 2015   tia x 3      Review of Systems  Constitutional: Negative for chills, diaphoresis and fever.  HENT: Positive for facial swelling and nosebleeds. Negative for congestion.   Respiratory: Positive for shortness of breath. Negative for cough and chest tightness.   Cardiovascular: Positive for chest pain (chest wall). Negative for palpitations and leg swelling.  Gastrointestinal: Positive for nausea and vomiting. Negative for  abdominal distention, abdominal pain, blood in stool and constipation.  Neurological: Positive for headaches. Negative for dizziness, tremors, syncope, speech difficulty, weakness and numbness.  Psychiatric/Behavioral: Negative for agitation, behavioral problems and confusion.      Objective:    BP (!) 210/120 (BP Location: Left Arm, Patient Position: Sitting, Cuff Size: Normal)   Pulse (!) 110   Temp 98.9 F (37.2 C) (Oral)   Resp 20   Ht 5' 9"  (1.753 m)   SpO2 96%  Nursing note and vital signs reviewed.  Physical Exam  Constitutional: He is oriented to person, place, and time. He appears well-developed and well-nourished. No distress.  HENT:  Head: Head is with raccoon's eyes. Head is without Battle's sign.  Right Ear: Hearing, tympanic membrane, external ear and ear canal normal.  Left Ear: Hearing, tympanic membrane, external ear and ear canal normal.  Nose: Sinus tenderness and  nasal deformity present. Epistaxis is observed.  Mouth/Throat: Uvula is midline, oropharynx is clear and moist and mucous membranes are normal.  Cardiovascular: Normal rate, regular rhythm, normal heart sounds and intact distal pulses.   Pulmonary/Chest: Effort normal and breath sounds normal. He exhibits crepitus and edema.    Neurological: He is alert and oriented to person, place, and time. No cranial nerve deficit or sensory deficit. GCS eye subscore is 4. GCS verbal subscore is 5. GCS motor subscore is 6.  Skin: Skin is warm and dry.  Psychiatric: He has a normal mood and affect. His behavior is normal. Judgment and thought content normal.       Assessment & Plan:   Problem List Items Addressed This Visit      Musculoskeletal and Integument   Closed fracture of nasal bones - Primary    Concern for nasal fracture given mechanism of injury. Does continue to experience bleeding likely related to Xarelto. Need to obtain x-rays. Ice for inflammation. Will refer to ED given mechanism of injury and  use of anticoagulation.       Multiple closed fractures of ribs of left side    Crepitus of left rib 6-8 noted. Following assessment the patient vomited bilious emesis likely related to levels of pain. There is concern for possible intra-cranial pathology given mechanism. Refer to ED for further assessment and treatment.         Other   Fall    Fall with questionable level of sobriety based on family report and significant mechanism with concern for head injury. Neurological exam is normal, however based on multiple episodes of vomiting and continued nausea it is recommended he proceed to the ED for further work up.           I am having Mr. Trost maintain his multivitamins ther. w/minerals, isosorbide-hydrALAZINE, olmesartan, diltiazem, diltiazem, sitaGLIPtin, furosemide, labetalol, zolpidem, and Rivaroxaban.   Follow-up: Return in about 3 weeks (around 05/10/2017), or if symptoms worsen or fail to improve.  Mauricio Po, FNP

## 2017-04-23 ENCOUNTER — Encounter: Payer: Self-pay | Admitting: Family

## 2017-04-25 ENCOUNTER — Ambulatory Visit (INDEPENDENT_AMBULATORY_CARE_PROVIDER_SITE_OTHER): Payer: BC Managed Care – PPO | Admitting: Family

## 2017-04-25 ENCOUNTER — Encounter: Payer: Self-pay | Admitting: Family

## 2017-04-25 VITALS — BP 154/82 | HR 63 | Temp 98.4°F | Resp 16 | Ht 69.0 in | Wt 186.0 lb

## 2017-04-25 DIAGNOSIS — Z23 Encounter for immunization: Secondary | ICD-10-CM

## 2017-04-25 DIAGNOSIS — S2232XD Fracture of one rib, left side, subsequent encounter for fracture with routine healing: Secondary | ICD-10-CM | POA: Diagnosis not present

## 2017-04-25 DIAGNOSIS — S0033XD Contusion of nose, subsequent encounter: Secondary | ICD-10-CM | POA: Diagnosis not present

## 2017-04-25 MED ORDER — OXYCODONE-ACETAMINOPHEN 5-325 MG PO TABS: 1.0000 | ORAL_TABLET | Freq: Three times a day (TID) | ORAL | 0 refills | Status: DC | PRN

## 2017-04-25 NOTE — Assessment & Plan Note (Addendum)
Nasal contusion appears to be healing adequately with no complications. Continues to have swelling and tenderness without bleeding. Continue with ice packs as needed for edema and pain.

## 2017-04-25 NOTE — Assessment & Plan Note (Signed)
Rib fracture appears to be healing with no complications. Continue rib belt as needed. Continue over-the-counter medications as needed for symptom relief and supportive care. Encouraged deep breathing and coughing. Pain continues to remain an issue. Refill oxycodone-acetaminophen x 2 weeks with goal of weaning. Continue Tylenol for pain. Follow up if symptoms worsen or do not improve.

## 2017-04-25 NOTE — Patient Instructions (Signed)
Thank you for choosing [facility].  SUMMARY AND INSTRUCTIONS:  Please continue to ice your nose and ribs.   Consider Solanpas or patches to place over the area.   We will refill your oxycodone - please use only as needed for breakthrough pain.  Medication:  Your prescription(s) have been submitted to your pharmacy or been printed and provided for you. Please take as directed and contact our office if you believe you are having problem(s) with the medication(s) or have any questions.  Follow up:  If your symptoms worsen or fail to improve, please contact our office for further instruction, or in case of emergency go directly to the emergency room at the closest medical facility.

## 2017-04-25 NOTE — Progress Notes (Signed)
Subjective:    Patient ID: Douglas Ballard, male    DOB: [birth year], 66 y.o.   MRN: [id number]  Chief Complaint  Patient presents with  . Hospitalization Follow-up    refill on oxycodone, having pain in nose and rib area    HPI:  Douglas Ballard is a 66 y.o. male who  has a past medical history of Alcohol withdrawal seizure ([place]) (06/28/2011); Anxiety; Atrial fibrillation ([place]); Cancer ([place]) (2012); Chronic diastolic CHF (congestive heart failure) ([place]) (11/01/2014); Compression fracture of thoracic vertebra (HCC) (06/26/2011); Diabetes mellitus type 2 in nonobese [facility]); Fistula, bladder; Frequent urination at night; History of adenomatous polyp of colon (06/14/2014); History of nuclear stress test; Hypertension; PNA (pneumonia) (06/26/2011); Sepsis due to urinary tract infection ([place]) (04/16/2014); and Stroke ([place]) (sept 1, 2015). and presents today for an office visit.   Previously evaluated in the office and ED following a fall striking his head. Imaging revealed a fracture of the left rib and nasal contusion. He was prescribed oxycodone-acetaminophen for the pain which he is currently out of. Reports taking Tylenol which does not help. Continues to have pain located in his nose and rib area. Severity of the pain is enough to effect his sleep. Has to sleep on his back or in chair.    Allergies  Allergen Reactions  . Hydrochlorothiazide Other (See Comments)    Pt gets hyponatremia  . Lasix [Furosemide] Other (See Comments)    Sodium levels drop when take  . Nsaids Other (See Comments)    Kidney disease/failure  . Sulfa Antibiotics Other (See Comments)    headaches      Outpatient Medications Prior to Visit  Medication Sig Dispense Refill  . diltiazem (CARDIZEM CD) 240 MG 24 hr capsule TAKE 1 CAPSULE EVERY       MORNING 90 capsule 2  . furosemide (LASIX) 20 MG tablet TAKE 1 TABLET BY MOUTH EVERY MONDAY, WEDNESDAY AND FRIDAY 30 tablet 0  . isosorbide-hydrALAZINE (BIDIL) 20-37.5 MG  tablet Take 2 tablets by mouth 2 (two) times daily.     Marland Kitchen labetalol (NORMODYNE) 200 MG tablet Take 2 tablets (400 mg total) by mouth 3 (three) times daily. 180 tablet 0  . Multiple Vitamins-Minerals (MULTIVITAMINS THER. W/MINERALS) TABS Take 1 tablet by mouth every morning.     . olmesartan (BENICAR) 40 MG tablet Take 1 tablet (40 mg total) by mouth every morning. 90 tablet 0  . ondansetron (ZOFRAN ODT) 8 MG disintegrating tablet Take 1 tablet (8 mg total) by mouth every 8 (eight) hours as needed for nausea or vomiting. 10 tablet 0  . Rivaroxaban (XARELTO) 15 MG TABS tablet Take 1 tablet (15 mg total) by mouth daily with supper. 90 tablet 1  . sitaGLIPtin (JANUVIA) 100 MG tablet Take 1 tablet (100 mg total) by mouth daily. 90 tablet 0  . zolpidem (AMBIEN) 10 MG tablet Take 1 tablet (10 mg total) by mouth at bedtime as needed for sleep. 30 tablet 1  . diltiazem (CARDIZEM CD) 240 MG 24 hr capsule TAKE 1 CAPSULE EVERY       MORNING 90 capsule 1  . oxyCODONE-acetaminophen (PERCOCET/ROXICET) 5-325 MG tablet Take 1 tablet by mouth every 4 (four) hours as needed for severe pain. 15 tablet 0   No facility-administered medications prior to visit.       Past Surgical History:  Procedure Laterality Date  . COLON SURGERY    . COLONOSCOPY N/A 06/14/2014   Procedure: COLONOSCOPY;  Surgeon: Gatha Mayer, MD;  Location:  [facility];  Service: Endoscopy;  Laterality: N/A;  . CYSTOSCOPY WITH STENT PLACEMENT Bilateral 06/15/2014   Procedure: CYSTOSCOPY WITH BILATERAL STENT PLACEMENT;  Surgeon: Bernestine Amass, MD;  Location: [facility];  Service: Urology;  Laterality: Bilateral;  . LIPOMA EXCISION  2012   Dr Nonah Mattes  . moles removed     Back  . PROCTOSCOPY N/A 06/15/2014   Procedure: RIGID PROCTOSCOPY;  Surgeon: Michael Boston, MD;  Location: [facility];  Service: General;  Laterality: N/A;  . RADIOLOGY WITH ANESTHESIA N/A 07/28/2014   Procedure: Embolization;  Surgeon: Luanne Bras, MD;  Location: [facility];   Service: Radiology;  Laterality: N/A;  . ROBOT ASSISTED LAPAROSCOPIC RADICAL PROSTATECTOMY  01/31/2011   Robotic-assisted laparoscopic radical retropubic      Past Medical History:  Diagnosis Date  . Alcohol withdrawal seizure ([place]) 06/28/2011   Alcohol withdraw seizure prior to admission is suspected from history given by family & Post ictal appearance in the ED.   Marland Kitchen Anxiety   . Atrial fibrillation ([place])   . Cancer [facility]) 2012   Prostate surgery  . Chronic diastolic CHF (congestive heart failure) ([place]) 11/01/2014   Echo 8/15: Mild LVH, EF 50-55%, moderate BAE  //  b. Echo 7/17: EF 55-60%, normal wall motion, grade 2 diastolic dysfunction, MAC, mild MR, moderate LAE, trivial PI  . Compression fracture of thoracic vertebra ([place]) 06/26/2011  . Diabetes mellitus type 2 in nonobese (HCC)   . Fistula, bladder   . Frequent urination at night   . History of adenomatous polyp of colon 06/14/2014  . History of nuclear stress test    a. Myoview 10/15: Overall Impression: Low risk stress nuclear study demonstrating mild baseline ST-T changes with normal myocardial perfusion and low normal EF of 50%. // b.Myoview 7/17: EF 50%, no ischemia or scar, low risk (EF normal by recent echo)  . Hypertension   . PNA (pneumonia) 06/26/2011  . Sepsis due to urinary tract infection ([place]) 04/16/2014  . Stroke ([place]) sept 1, 2015   tia x 3      Review of Systems  Constitutional: Negative for chills and fever.  HENT: Negative for congestion, nosebleeds and postnasal drip.   Respiratory: Negative for cough, chest tightness and shortness of breath.   Cardiovascular: Positive for chest pain.  Neurological: Positive for headaches.      Objective:    BP (!) 154/82 (BP Location: Left Arm, Patient Position: Sitting, Cuff Size: Normal)   Pulse 63   Temp 98.4 F (36.9 C) (Oral)   Resp 16   Ht 5' 9"  (1.753 m)   Wt 186 lb (84.4 kg)   SpO2 96%   BMI 27.47 kg/m  Nursing note and vital signs  reviewed.  Physical Exam  Constitutional: He is oriented to person, place, and time. He appears well-developed and well-nourished. No distress.  Eyes:  Resolving edema around bilateral eyes.   Cardiovascular: Normal rate, regular rhythm, normal heart sounds and intact distal pulses.  Exam reveals no gallop and no friction rub.   No murmur heard. Pulmonary/Chest: Effort normal and breath sounds normal. No respiratory distress. He has no wheezes. He has no rales. He exhibits tenderness.  Neurological: He is alert and oriented to person, place, and time.  Skin: Skin is warm and dry.  Psychiatric: He has a normal mood and affect. His behavior is normal. Judgment and thought content normal.       Assessment & Plan:   Problem List Items Addressed This Visit  Musculoskeletal and Integument   Closed rib fracture - Primary    Rib fracture appears to be healing with no complications. Continue rib belt as needed. Continue over-the-counter medications as needed for symptom relief and supportive care. Encouraged deep breathing and coughing. Pain continues to remain an issue. Refill oxycodone-acetaminophen x 2 weeks with goal of weaning. Continue Tylenol for pain. Follow up if symptoms worsen or do not improve.         Other   Nasal contusion    Nasal contusion appears to be healing adequately with no complications. Continues to have swelling and tenderness without bleeding. Continue with ice packs as needed for edema and pain.       Other Visit Diagnoses    Need for diphtheria-tetanus-pertussis (Tdap) vaccine       Relevant Orders   Tdap vaccine greater than or equal to 7yo IM (Completed)       I have changed Mr. Shelton oxyCODONE-acetaminophen. I am also having him maintain his multivitamins ther. w/minerals, isosorbide-hydrALAZINE, olmesartan, diltiazem, sitaGLIPtin, furosemide, labetalol, zolpidem, Rivaroxaban, and ondansetron.   Meds ordered this encounter  Medications  .  oxyCODONE-acetaminophen (PERCOCET/ROXICET) 5-325 MG tablet    Sig: Take 1 tablet by mouth every 8 (eight) hours as needed for severe pain.    Dispense:  42 tablet    Refill:  0    Order Specific Question:   Supervising Provider    Answer:   Pricilla Holm A [[id number]]     Follow-up: Return if symptoms worsen or fail to improve.  Mauricio Po, FNP

## 2017-05-22 ENCOUNTER — Other Ambulatory Visit: Payer: Self-pay | Admitting: Family

## 2017-05-22 DIAGNOSIS — F5101 Primary insomnia: Secondary | ICD-10-CM

## 2017-05-23 ENCOUNTER — Encounter: Payer: Self-pay | Admitting: Family

## 2017-05-23 NOTE — Telephone Encounter (Signed)
Faxed script back to [facility] pharmacy,.../lmb

## 2017-05-23 NOTE — Telephone Encounter (Signed)
Pls advise since Marya Amsler is out of office...Johny Chess

## 2017-05-23 NOTE — Telephone Encounter (Signed)
Pharmacy called checking on this refill request.

## 2017-05-23 NOTE — Telephone Encounter (Signed)
Printed and signed.  

## 2017-05-25 ENCOUNTER — Other Ambulatory Visit: Payer: Self-pay | Admitting: Physician Assistant

## 2017-05-25 DIAGNOSIS — I10 Essential (primary) hypertension: Secondary | ICD-10-CM

## 2017-05-25 DIAGNOSIS — I5032 Chronic diastolic (congestive) heart failure: Secondary | ICD-10-CM

## 2017-05-25 DIAGNOSIS — N183 Chronic kidney disease, stage 3 unspecified: Secondary | ICD-10-CM

## 2017-05-28 ENCOUNTER — Ambulatory Visit (INDEPENDENT_AMBULATORY_CARE_PROVIDER_SITE_OTHER): Payer: BC Managed Care – PPO | Admitting: Family

## 2017-05-28 ENCOUNTER — Encounter: Payer: Self-pay | Admitting: Family

## 2017-05-28 ENCOUNTER — Other Ambulatory Visit (INDEPENDENT_AMBULATORY_CARE_PROVIDER_SITE_OTHER): Payer: BC Managed Care – PPO

## 2017-05-28 VITALS — BP 148/80 | HR 64 | Temp 98.6°F | Resp 16 | Ht 69.0 in | Wt 187.0 lb

## 2017-05-28 DIAGNOSIS — I5032 Chronic diastolic (congestive) heart failure: Secondary | ICD-10-CM

## 2017-05-28 DIAGNOSIS — Z0000 Encounter for general adult medical examination without abnormal findings: Secondary | ICD-10-CM | POA: Diagnosis not present

## 2017-05-28 DIAGNOSIS — Z23 Encounter for immunization: Secondary | ICD-10-CM

## 2017-05-28 DIAGNOSIS — Z7289 Other problems related to lifestyle: Secondary | ICD-10-CM | POA: Diagnosis not present

## 2017-05-28 DIAGNOSIS — Z125 Encounter for screening for malignant neoplasm of prostate: Secondary | ICD-10-CM

## 2017-05-28 DIAGNOSIS — E119 Type 2 diabetes mellitus without complications: Secondary | ICD-10-CM | POA: Diagnosis not present

## 2017-05-28 DIAGNOSIS — N183 Chronic kidney disease, stage 3 unspecified: Secondary | ICD-10-CM

## 2017-05-28 LAB — COMPREHENSIVE METABOLIC PANEL
ALBUMIN: 3.9 g/dL (ref 3.5–5.2)
ALT: 6 U/L (ref 0–53)
AST: 7 U/L (ref 0–37)
Alkaline Phosphatase: 58 U/L (ref 39–117)
BILIRUBIN TOTAL: 0.8 mg/dL (ref 0.2–1.2)
BUN: 32 mg/dL — AB (ref 6–23)
CHLORIDE: 99 meq/L (ref 96–112)
CO2: 24 mEq/L (ref 19–32)
CREATININE: 2.29 mg/dL — AB (ref 0.40–1.50)
Calcium: 8.6 mg/dL (ref 8.4–10.5)
GFR: 30.56 mL/min — ABNORMAL LOW (ref >60.00)
GLUCOSE: 285 mg/dL — AB (ref 70–99)
Potassium: 4.3 mEq/L (ref 3.5–5.1)
SODIUM: 133 meq/L — AB (ref 135–145)
TOTAL PROTEIN: 6.4 g/dL (ref 6.0–8.3)

## 2017-05-28 LAB — CBC
HCT: 38.2 % — ABNORMAL LOW (ref 39.0–52.0)
Hemoglobin: 12.8 g/dL — ABNORMAL LOW (ref 13.0–17.0)
MCHC: 33.5 g/dL (ref 30.0–36.0)
MCV: 100.6 fl — ABNORMAL HIGH (ref 78.0–100.0)
PLATELETS: 244 10*3/uL (ref 150.0–400.0)
RBC: 3.8 Mil/uL — AB (ref 4.22–5.81)
RDW: 13.1 % (ref 11.5–15.5)
WBC: 7.4 10*3/uL (ref 4.0–10.5)

## 2017-05-28 LAB — LIPID PANEL
CHOL/HDL RATIO: 2
Cholesterol: 113 mg/dL (ref 0–200)
HDL: 59.5 mg/dL (ref >39.00)
LDL CALC: 44 mg/dL (ref 0–99)
NonHDL: 53.65
TRIGLYCERIDES: 49 mg/dL (ref 0.0–149.0)
VLDL: 9.8 mg/dL (ref 0.0–40.0)

## 2017-05-28 LAB — PSA: PSA: 0 ng/mL — ABNORMAL LOW (ref 0.10–4.00)

## 2017-05-28 LAB — HEMOGLOBIN A1C: Hgb A1c MFr Bld: 7.3 % — ABNORMAL HIGH (ref 4.6–6.5)

## 2017-05-28 NOTE — Patient Instructions (Signed)
Thank you for choosing [facility].  SUMMARY AND INSTRUCTIONS:  Please continue to take your medications as prescribed.   We will check your blood work today.  Continue to work on eating a balanced, moderate, and varied intake.  Continue with physical activity.  Recommend: Dr. Juleen China, Dr. Yong Channel, Inda Coke, [place]  Labs:  Please stop by the lab on the lower level of the building for your blood work. Your results will be released to [place] (or called to you) after review, usually within 72 hours after test completion. If any changes need to be made, you will be notified at that same time.  1.) The lab is open from 7:30am to 5:30 pm Monday-Friday 2.) No appointment is necessary 3.) Fasting (if needed) is 6-8 hours after food and drink; black coffee and water are okay   Follow up:  If your symptoms worsen or fail to improve, please contact our office for further instruction, or in case of emergency go directly to the emergency room at the closest medical facility.    Health Maintenance, Male A healthy lifestyle and preventive care is important for your health and wellness. Ask your health care provider about what schedule of regular examinations is right for you. What should I know about weight and diet? Eat a Healthy Diet  Eat plenty of vegetables, fruits, whole grains, low-fat dairy products, and lean protein.  Do not eat a lot of foods high in solid fats, added sugars, or salt.  Maintain a Healthy Weight Regular exercise can help you achieve or maintain a healthy weight. You should:  Do at least 150 minutes of exercise each week. The exercise should increase your heart rate and make you sweat (moderate-intensity exercise).  Do strength-training exercises at least twice a week.  Watch Your Levels of Cholesterol and Blood Lipids  Have your blood tested for lipids and cholesterol every 5 years starting at 66 years of age. If you are at high risk for heart disease,  you should start having your blood tested when you are 66 years old. You may need to have your cholesterol levels checked more often if: ? Your lipid or cholesterol levels are high. ? You are older than 66 years of age. ? You are at high risk for heart disease.  What should I know about cancer screening? Many types of cancers can be detected early and may often be prevented. Lung Cancer  You should be screened every year for lung cancer if: ? You are a current smoker who has smoked for at least 30 years. ? You are a former smoker who has quit within the past 15 years.  Talk to your health care provider about your screening options, when you should start screening, and how often you should be screened.  Colorectal Cancer  Routine colorectal cancer screening usually begins at 66 years of age and should be repeated every 5-10 years until you are 66 years old. You may need to be screened more often if early forms of precancerous polyps or small growths are found. Your health care provider may recommend screening at an earlier age if you have risk factors for colon cancer.  Your health care provider may recommend using home test kits to check for hidden blood in the stool.  A small camera at the end of a tube can be used to examine your colon (sigmoidoscopy or colonoscopy). This checks for the earliest forms of colorectal cancer.  Prostate and Testicular Cancer  Depending on your  age and overall health, your health care provider may do certain tests to screen for prostate and testicular cancer.  Talk to your health care provider about any symptoms or concerns you have about testicular or prostate cancer.  Skin Cancer  Check your skin from head to toe regularly.  Tell your health care provider about any new moles or changes in moles, especially if: ? There is a change in a mole's size, shape, or color. ? You have a mole that is larger than a pencil eraser.  Always use sunscreen. Apply  sunscreen liberally and repeat throughout the day.  Protect yourself by wearing long sleeves, pants, a wide-brimmed hat, and sunglasses when outside.  What should I know about heart disease, diabetes, and high blood pressure?  If you are 67-13 years of age, have your blood pressure checked every 3-5 years. If you are 53 years of age or older, have your blood pressure checked every year. You should have your blood pressure measured twice-once when you are at a hospital or clinic, and once when you are not at a hospital or clinic. Record the average of the two measurements. To check your blood pressure when you are not at a hospital or clinic, you can use: ? An automated blood pressure machine at a pharmacy. ? A home blood pressure monitor.  Talk to your health care provider about your target blood pressure.  If you are between 35-79 years old, ask your health care provider if you should take aspirin to prevent heart disease.  Have regular diabetes screenings by checking your fasting blood sugar level. ? If you are at a normal weight and have a low risk for diabetes, have this test once every three years after the age of 39. ? If you are overweight and have a high risk for diabetes, consider being tested at a younger age or more often.  A one-time screening for abdominal aortic aneurysm (AAA) by ultrasound is recommended for men aged 6-75 years who are current or former smokers. What should I know about preventing infection? Hepatitis B If you have a higher risk for hepatitis B, you should be screened for this virus. Talk with your health care provider to find out if you are at risk for hepatitis B infection. Hepatitis C Blood testing is recommended for:  Everyone born from [birth year] through 1965.  Anyone with known risk factors for hepatitis C.  Sexually Transmitted Diseases (STDs)  You should be screened each year for STDs including gonorrhea and chlamydia if: ? You are sexually active  and are younger than 66 years of age. ? You are older than 66 years of age and your health care provider tells you that you are at risk for this type of infection. ? Your sexual activity has changed since you were last screened and you are at an increased risk for chlamydia or gonorrhea. Ask your health care provider if you are at risk.  Talk with your health care provider about whether you are at high risk of being infected with HIV. Your health care provider may recommend a prescription medicine to help prevent HIV infection.  What else can I do?  Schedule regular health, dental, and eye exams.  Stay current with your vaccines (immunizations).  Do not use any tobacco products, such as cigarettes, chewing tobacco, and e-cigarettes. If you need help quitting, ask your health care provider.  Limit alcohol intake to no more than 2 drinks per day. One drink equals 12 ounces  of beer, 5 ounces of wine, or 1 ounces of hard liquor.  Do not use street drugs.  Do not share needles.  Ask your health care provider for help if you need support or information about quitting drugs.  Tell your health care provider if you often feel depressed.  Tell your health care provider if you have ever been abused or do not feel safe at home. This information is not intended to replace advice given to you by your health care provider. Make sure you discuss any questions you have with your health care provider. Document Released: 02/02/2008 Document Revised: 04/04/2016 Document Reviewed: 05/10/2015 Elsevier Interactive Patient Education  [employer].

## 2017-05-28 NOTE — Assessment & Plan Note (Signed)
1) Anticipatory Guidance: Discussed importance of wearing a seatbelt while driving and not texting while driving; changing batteries in smoke detector at least once annually; wearing suntan lotion when outside; eating a balanced and moderate diet; getting physical activity at least 30 minutes per day.  2) Immunizations / Screenings / Labs:  Prevnar and influenza updated today. Plan discussed for Pneumovax administration. All other immunizations are up-to-date per recommendations. Colon cancer screening is up-to-date. Obtain hepatitis C antibody for hepatitis C screening. Obtain PSA for prostate cancer screening. All other screenings are up-to-date per recommendations. Obtain CBC, CMET, and lipid profile.    Overall well exam with risk factors for cardiovascular disease including tobacco use, hypertension, type 2 diabetes, and alcohol use. Chronic conditions appear adequately controlled through medication regimen with no adverse side effects. Encouraged a nutritional intake that is moderate, balance, and varied. He does exercise and walk daily. Encouraged to decrease alcohol intake to 1-2 drinks per night. He remains a former smoker. Continue other healthy lifestyle behaviors and choices in follow-up prevention exam in 1 year. Follow-up office visit pending blood work and for chronic conditions.

## 2017-05-28 NOTE — Assessment & Plan Note (Signed)
Appears adequately controlled current medication regimen and no adverse side effects or hypoglycemic readings. Diabetic foot exam completed today. Diabetic eye exam is up-to-date. Maintained on Benicar for CAD risk reduction. Encouraged to continue monitoring blood sugars at home daily. Continue current dosage of Januvia. Obtain A1c. Follow up in 6 months or sooner pending blood work results.

## 2017-05-28 NOTE — Assessment & Plan Note (Signed)
Appears euvolemic with no exacerbation. Encouraged to continue to monitor weights at home and follow a low sodium intake. Maintained on labetolol and furosemide. Follow up and changes per cardiology.

## 2017-05-28 NOTE — Assessment & Plan Note (Signed)
Appears stable. Continue to monitor through risk factor adjustment associated with diabetes and hypertension. Follow up pending blood work results.

## 2017-05-28 NOTE — Progress Notes (Signed)
Subjective:    Patient ID: Douglas Ballard, male    DOB: 1950/11/13, 66 y.o.   MRN: [id number]  Chief Complaint  Patient presents with  . CPE    Not fasting     HPI:  Douglas Ballard is a 66 y.o. male who presents today for an annual wellness visit.   1) Health Maintenance -   Diet - Averages about 2-3 meals per day consisting of a regular diet; Caffeine intake of about 1 cup daily  Exercise - Walking daily   2) Preventative Exams / Immunizations:  Dental -- Up to date  Vision -- Up to date   Health Maintenance  Topic Date Due  . Hepatitis C Screening  [birth year]  . FOOT EXAM  08/01/1961  . HIV Screening  08/01/1966  . PNA vac Low Risk Adult (1 of 2 - PCV13) 08/01/2016  . HEMOGLOBIN A1C  02/24/2017  . INFLUENZA VACCINE  03/20/2017  . OPHTHALMOLOGY EXAM  05/21/2017  . COLONOSCOPY  06/14/2024  . TETANUS/TDAP  04/26/2027    Immunization History  Administered Date(s) Administered  . Influenza Split 06/27/2011  . Influenza, High Dose Seasonal PF 05/28/2017  . Pneumococcal Conjugate-13 05/28/2017  . Pneumococcal Polysaccharide-23 06/27/2011  . Tdap 04/25/2017     Allergies  Allergen Reactions  . Hydrochlorothiazide Other (See Comments)    Pt gets hyponatremia  . Lasix [Furosemide] Other (See Comments)    Sodium levels drop when take  . Nsaids Other (See Comments)    Kidney disease/failure  . Sulfa Antibiotics Other (See Comments)    headaches     Outpatient Medications Prior to Visit  Medication Sig Dispense Refill  . diltiazem (CARDIZEM CD) 240 MG 24 hr capsule TAKE 1 CAPSULE EVERY       MORNING 90 capsule 2  . furosemide (LASIX) 20 MG tablet TAKE 1 TABLET BY MOUTH EVERY MONDAY, WEDNESDAY AND FRIDAY 30 tablet 1  . isosorbide-hydrALAZINE (BIDIL) 20-37.5 MG tablet Take 2 tablets by mouth 2 (two) times daily.     Marland Kitchen labetalol (NORMODYNE) 200 MG tablet Take 2 tablets (400 mg total) by mouth 3 (three) times daily. 180 tablet 0  . Multiple Vitamins-Minerals  (MULTIVITAMINS THER. W/MINERALS) TABS Take 1 tablet by mouth every morning.     . olmesartan (BENICAR) 40 MG tablet Take 1 tablet (40 mg total) by mouth every morning. 90 tablet 0  . ondansetron (ZOFRAN ODT) 8 MG disintegrating tablet Take 1 tablet (8 mg total) by mouth every 8 (eight) hours as needed for nausea or vomiting. 10 tablet 0  . oxyCODONE-acetaminophen (PERCOCET/ROXICET) 5-325 MG tablet Take 1 tablet by mouth every 8 (eight) hours as needed for severe pain. 42 tablet 0  . Rivaroxaban (XARELTO) 15 MG TABS tablet Take 1 tablet (15 mg total) by mouth daily with supper. 90 tablet 1  . sitaGLIPtin (JANUVIA) 100 MG tablet Take 1 tablet (100 mg total) by mouth daily. 90 tablet 0  . zolpidem (AMBIEN) 10 MG tablet TAKE 1 TABLET BY MOUTH AT BEDTIME AS NEEDED FOR SLEEP 30 tablet 0   No facility-administered medications prior to visit.      Past Medical History:  Diagnosis Date  . 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   Marland Kitchen Anxiety   . Atrial fibrillation ([place])   . Cancer [facility]) 2012   Prostate surgery  . Chronic diastolic CHF (congestive heart failure) (  [place]) 11/01/2014   Echo 8/15: Mild LVH, EF 50-55%, moderate BAE  //  b. Echo 7/17: EF 55-60%, normal wall motion, grade 2 diastolic dysfunction, MAC, mild MR, moderate LAE, trivial PI  . Compression fracture of thoracic vertebra ([place]) 06/26/2011  . Diabetes mellitus type 2 in nonobese (HCC)   . Fistula, bladder   . Frequent urination at night   . History of adenomatous polyp of colon 06/14/2014  . History of nuclear stress test    a. Myoview 10/15: Overall Impression: Low risk stress nuclear study demonstrating mild baseline ST-T changes with normal myocardial perfusion and low normal EF of 50%. // b.Myoview 7/17: EF 50%, no ischemia or scar, low risk (EF normal by recent echo)  . Hypertension   . PNA (pneumonia)  06/26/2011  . Sepsis due to urinary tract infection ([place]) 04/16/2014  . Stroke ([place]) sept 1, 2015   tia x 3     Past Surgical History:  Procedure Laterality Date  . COLON SURGERY    . COLONOSCOPY N/A 06/14/2014   Procedure: COLONOSCOPY;  Surgeon: Gatha Mayer, MD;  Location: [facility];  Service: Endoscopy;  Laterality: N/A;  . CYSTOSCOPY WITH STENT PLACEMENT Bilateral 06/15/2014   Procedure: CYSTOSCOPY WITH BILATERAL STENT PLACEMENT;  Surgeon: Bernestine Amass, MD;  Location: [facility];  Service: Urology;  Laterality: Bilateral;  . LIPOMA EXCISION  2012   Dr Nonah Mattes  . moles removed     Back  . PROCTOSCOPY N/A 06/15/2014   Procedure: RIGID PROCTOSCOPY;  Surgeon: Michael Boston, MD;  Location: [facility];  Service: General;  Laterality: N/A;  . RADIOLOGY WITH ANESTHESIA N/A 07/28/2014   Procedure: Embolization;  Surgeon: Luanne Bras, MD;  Location: [facility];  Service: Radiology;  Laterality: N/A;  . ROBOT ASSISTED LAPAROSCOPIC RADICAL PROSTATECTOMY  01/31/2011   Robotic-assisted laparoscopic radical retropubic     Family History  Problem Relation Age of Onset  . Atrial fibrillation Father        onset 36s. Had pacemaker placed for sinus pause  . Prostate cancer Father   . Breast cancer Mother   . Lung cancer Mother   . Leukemia Brother   . Colon polyps Brother   . Colon cancer Neg Hx   . Diabetes Neg Hx      Social History   Social History  . Marital status: Married    Spouse name: N/A  . Number of children: 2  . Years of education: N/A   Occupational History  . 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 Smoking status: Former Smoker    Packs/day: 2.00    Years: 25.00    Types: Cigarettes    Quit date: 04/14/2014  . Smokeless tobacco: Never Used  . Alcohol use 4.2 - 6.0 oz/week    7 - 10 Standard drinks or equivalent per week     Comment: 2 bottles of wine with wife a week; cocktail every other night  . Drug use: No  . Sexual activity: Not  Currently   Other Topics Concern  . Not on file   Social History Narrative   Fun/Hobby: Walk, cards, gardening       Review of Systems  Constitutional: Denies fever, chills, fatigue, or significant weight gain/loss. HENT: Head: Denies headache or neck pain Ears: Denies changes in hearing, ringing in ears, earache, drainage Nose: Denies discharge, stuffiness, itching, nosebleed, sinus pain Throat: Denies sore throat, hoarseness, dry mouth, sores, thrush Eyes: Denies loss/changes  in vision, pain, redness, blurry/double vision, flashing lights Cardiovascular: Denies chest pain/discomfort, tightness, palpitations, shortness of breath with activity, difficulty lying down, swelling, sudden awakening with shortness of breath Respiratory: Denies shortness of breath, cough, sputum production, wheezing Gastrointestinal: Denies dysphasia, heartburn, change in appetite, nausea, change in bowel habits, rectal bleeding, constipation, diarrhea, yellow skin or eyes Genitourinary: Denies frequency, urgency, burning/pain, blood in urine, incontinence, change in urinary strength. Musculoskeletal: Denies muscle/joint pain, stiffness, back pain, redness or swelling of joints, trauma Skin: Denies rashes, lumps, itching, dryness, color changes, or hair/nail changes Neurological: Denies dizziness, fainting, seizures, weakness, numbness, tingling, tremor Psychiatric - Denies nervousness, stress, depression or memory loss Endocrine: Denies heat or cold intolerance, sweating, frequent urination, excessive thirst, changes in appetite Hematologic: Denies ease of bruising or bleeding     Objective:     BP (!) 148/80 (BP Location: Left Arm, Patient Position: Sitting, Cuff Size: Normal)   Pulse 64   Temp 98.6 F (37 C) (Oral)   Resp 16   Ht _0  (1.753 m)   Wt 187 lb (84.8 kg)   SpO2 97%   BMI 27.62 kg/m  Nursing note and vital signs reviewed.  Physical Exam  Constitutional: He is oriented to person,  place, and time. He appears well-developed and well-nourished.  HENT:  Head: Normocephalic.  Right Ear: Hearing, tympanic membrane, external ear and ear canal normal.  Left Ear: Hearing, tympanic membrane, external ear and ear canal normal.  Nose: Nose normal.  Mouth/Throat: Uvula is midline, oropharynx is clear and moist and mucous membranes are normal.  Eyes: Pupils are equal, round, and reactive to light. Conjunctivae and EOM are normal.  Neck: Neck supple. No JVD present. No tracheal deviation present. No thyromegaly present.  Cardiovascular: Normal rate, regular rhythm, normal heart sounds and intact distal pulses.   Pulmonary/Chest: Effort normal and breath sounds normal.  Abdominal: Soft. Bowel sounds are normal. He exhibits no distension and no mass. There is no tenderness. There is no rebound and no guarding.  Musculoskeletal: Normal range of motion. He exhibits no edema or tenderness.  Lymphadenopathy:    He has no cervical adenopathy.  Neurological: He is alert and oriented to person, place, and time. He has normal reflexes. No cranial nerve deficit. He exhibits normal muscle tone. Coordination normal.  Skin: Skin is warm and dry.  Psychiatric: He has a normal mood and affect. His behavior is normal. Judgment and thought content normal.       Assessment & Plan:   Problem List Items Addressed This Visit      Cardiovascular and Mediastinum   Chronic diastolic CHF (congestive heart failure) ([place])    Appears euvolemic with no exacerbation. Encouraged to continue to monitor weights at home and follow a low sodium intake. Maintained on labetolol and furosemide. Follow up and changes per cardiology.        Endocrine   Diabetes mellitus type 2 in nonobese ([place])    Appears adequately controlled current medication regimen and no adverse side effects or hypoglycemic readings. Diabetic foot exam completed today. Diabetic eye exam is up-to-date. Maintained on Benicar for CAD risk  reduction. Encouraged to continue monitoring blood sugars at home daily. Continue current dosage of Januvia. Obtain A1c. Follow up in 6 months or sooner pending blood work results.       Relevant Orders   Hemoglobin A1c     Genitourinary   CKD (chronic kidney disease) stage 3, GFR 30-59 ml/min (HCC)    Appears stable. Continue  to monitor through risk factor adjustment associated with diabetes and hypertension. Follow up pending blood work results.         Other   Routine adult health maintenance - Primary    1) Anticipatory Guidance: Discussed importance of wearing a seatbelt while driving and not texting while driving; changing batteries in smoke detector at least once annually; wearing suntan lotion when outside; eating a balanced and moderate diet; getting physical activity at least 30 minutes per day.  2) Immunizations / Screenings / Labs:  Prevnar and influenza updated today. Plan discussed for Pneumovax administration. All other immunizations are up-to-date per recommendations. Colon cancer screening is up-to-date. Obtain hepatitis C antibody for hepatitis C screening. Obtain PSA for prostate cancer screening. All other screenings are up-to-date per recommendations. Obtain CBC, CMET, and lipid profile.    Overall well exam with risk factors for cardiovascular disease including tobacco use, hypertension, type 2 diabetes, and alcohol use. Chronic conditions appear adequately controlled through medication regimen with no adverse side effects. Encouraged a nutritional intake that is moderate, balance, and varied. He does exercise and walk daily. Encouraged to decrease alcohol intake to 1-2 drinks per night. He remains a former smoker. Continue other healthy lifestyle behaviors and choices in follow-up prevention exam in 1 year. Follow-up office visit pending blood work and for chronic conditions.       Relevant Orders   Comprehensive metabolic panel   CBC   Lipid panel   PSA    Other  Visit Diagnoses    Other problems related to lifestyle       Relevant Orders   Hepatitis C antibody   Need for vaccination with 13-polyvalent pneumococcal conjugate vaccine       Relevant Orders   Pneumococcal conjugate vaccine 13-valent (Completed)   Need for influenza vaccination       Relevant Orders   Flu vaccine HIGH DOSE PF (Fluzone High dose) (Completed)       I am having Mr. Selley maintain his multivitamins ther. w/minerals, isosorbide-hydrALAZINE, olmesartan, diltiazem, sitaGLIPtin, labetalol, Rivaroxaban, ondansetron, oxyCODONE-acetaminophen, zolpidem, and furosemide.   Follow-up: Return in about 6 months (around 11/26/2017), or if symptoms worsen or fail to improve.   Mauricio Po, FNP

## 2017-05-29 LAB — HEPATITIS C ANTIBODY
Hepatitis C Ab: NONREACTIVE
SIGNAL TO CUT-OFF: 0.02 (ref <1.00)

## 2017-06-03 ENCOUNTER — Other Ambulatory Visit: Payer: Self-pay | Admitting: Physician Assistant

## 2017-06-03 DIAGNOSIS — I5032 Chronic diastolic (congestive) heart failure: Secondary | ICD-10-CM

## 2017-06-03 DIAGNOSIS — I10 Essential (primary) hypertension: Secondary | ICD-10-CM

## 2017-06-03 DIAGNOSIS — N183 Chronic kidney disease, stage 3 unspecified: Secondary | ICD-10-CM

## 2017-06-03 NOTE — Telephone Encounter (Signed)
REFILL 

## 2017-06-21 ENCOUNTER — Encounter: Payer: Self-pay | Admitting: Cardiology

## 2017-06-24 ENCOUNTER — Encounter: Payer: Self-pay | Admitting: Family

## 2017-06-24 ENCOUNTER — Other Ambulatory Visit: Payer: Self-pay | Admitting: Internal Medicine

## 2017-06-24 DIAGNOSIS — F5101 Primary insomnia: Secondary | ICD-10-CM

## 2017-06-25 ENCOUNTER — Encounter: Payer: Self-pay | Admitting: Family

## 2017-06-26 ENCOUNTER — Other Ambulatory Visit: Payer: Self-pay | Admitting: Internal Medicine

## 2017-06-26 DIAGNOSIS — F5101 Primary insomnia: Secondary | ICD-10-CM

## 2017-06-26 MED ORDER — ZOLPIDEM TARTRATE 10 MG PO TABS: 10.0000 mg | ORAL_TABLET | Freq: Every evening | ORAL | 0 refills | Status: DC | PRN

## 2017-06-26 NOTE — Telephone Encounter (Signed)
Faxed

## 2017-06-26 NOTE — Telephone Encounter (Signed)
Done hardcopy to Shirron  

## 2017-06-26 NOTE — Telephone Encounter (Signed)
Pt saw Marya Amsler on 05/28/17... Check [place] registry last filled 05/23/2017...Johny Chess

## 2017-06-27 ENCOUNTER — Ambulatory Visit: Payer: BC Managed Care – PPO | Admitting: Family Medicine

## 2017-06-27 ENCOUNTER — Encounter: Payer: Self-pay | Admitting: Family Medicine

## 2017-06-27 DIAGNOSIS — E1122 Type 2 diabetes mellitus with diabetic chronic kidney disease: Secondary | ICD-10-CM | POA: Diagnosis not present

## 2017-06-27 DIAGNOSIS — E1159 Type 2 diabetes mellitus with other circulatory complications: Secondary | ICD-10-CM

## 2017-06-27 DIAGNOSIS — F5101 Primary insomnia: Secondary | ICD-10-CM | POA: Diagnosis not present

## 2017-06-27 DIAGNOSIS — I480 Paroxysmal atrial fibrillation: Secondary | ICD-10-CM

## 2017-06-27 DIAGNOSIS — I5032 Chronic diastolic (congestive) heart failure: Secondary | ICD-10-CM | POA: Diagnosis not present

## 2017-06-27 DIAGNOSIS — E119 Type 2 diabetes mellitus without complications: Secondary | ICD-10-CM

## 2017-06-27 DIAGNOSIS — I10 Essential (primary) hypertension: Secondary | ICD-10-CM | POA: Diagnosis not present

## 2017-06-27 DIAGNOSIS — N183 Chronic kidney disease, stage 3 unspecified: Secondary | ICD-10-CM

## 2017-06-27 DIAGNOSIS — F1020 Alcohol dependence, uncomplicated: Secondary | ICD-10-CM | POA: Diagnosis not present

## 2017-06-27 MED ORDER — ZOLPIDEM TARTRATE 10 MG PO TABS: 10.0000 mg | ORAL_TABLET | Freq: Every evening | ORAL | 1 refills | Status: DC | PRN

## 2017-06-27 NOTE — Progress Notes (Signed)
    Subjective:  Douglas Ballard is a 66 y.o. male who presents today with a chief complaint of insomnia.   HPI:  Insomnia, chronic issue Currently on Ambien 10 mg.  Takes this most days, not every day.  Recently has had more difficulty sleeping due to stress from work.  States that he has not been paid in approximately 3 months.  When he takes Ambien it works well for him.  No obvious side effects.  Type 2 diabetes, chronic issue Currently on Januvia 100 mg daily.  Compliant with his medication without side effects.  No polyuria or polydipsia.  Does not check blood sugars at home.  Hypertension, Chronic Problem, Stable BP Readings from Last 3 Encounters:  06/27/17 (!) 143/85  05/28/17 (!) 148/80  04/25/17 (!) 154/82   Home BP monitoring: 130s-140s over 70s-80s Current Medications: Clonidine 0.1 mg daily, Cardizem 240 mg daily, BiDil twice daily, labetalol 400 mg 3 times daily, olmesartan 40 mg daily, compliant without side effects.   ROS: Denies any chest pain, shortness of breath, dyspnea on exertion, leg edema.   ROS: Per HPI  PMH: Smoking history reviewed. Former smoker.   Objective:  Physical Exam: BP (!) 143/85   Pulse 70   Ht 5\' 9"  (1.753 m)   Wt 184 lb 9.6 oz (83.7 kg)   SpO2 98%   BMI 27.26 kg/m   Gen: NAD, resting comfortably CV: Irregular rhythm with no murmurs appreciated Pulm: NWOB, CTAB with no crackles, wheezes, or rhonchi MSK: No edema, cyanosis, or clubbing noted.   -Feet: No deformities.  No ulcerations.  DP and PT pulses present bilaterally.  Monofilament testing normal bilaterally. Skin: Warm, dry Neuro: Grossly normal, moves all extremities Psych: Normal affect and thought content  Assessment/Plan:  Hypertension associated with diabetes ([place]) Slightly above goal today.  Per his report is usually at goal at home.  No medication changes needed today.  Follow-up at next office visit.  If persistently elevated would consider increasing dose of current  medications versus adding additional agent such as spironolactone.  Paroxysmal atrial fibrillation Continue with rate control and anticoagulation.  Chronic diastolic CHF (congestive heart failure) (HCC) No signs of volume overload today.  Continue regimen including Lasix, BiDil, labetalol, Benicar.  Type 2 diabetes mellitus with diabetic chronic kidney disease ([place]) Continue Januvia.  Patient not a candidate for metformin due to his CKD.  Recheck A1c in approximately 4 months.  Discussed lifestyle modifications.  Consider GLP-1 agonist in the future if not controlled with oral medications and lifestyle intervention alone.  Alcohol use disorder, moderate, dependence ([place]) Encouraged abstinence.  Insomnia Ambien refilled today.  Database reviewed without red flags.  Of note, noticed the patient recently had prescription faxed from his old office yesterday to the pharmacy.  His pharmacy was called and told to disregard this fax.     [name]. [name], MD 06/27/2017 2:48 PM

## 2017-06-27 NOTE — Assessment & Plan Note (Addendum)
Continue Januvia.  Patient not a candidate for metformin due to his CKD.  Recheck A1c in approximately 4 months.  Discussed lifestyle modifications.  Consider GLP-1 agonist in the future if not controlled with oral medications and lifestyle intervention alone.

## 2017-06-27 NOTE — Assessment & Plan Note (Signed)
No signs of volume overload today.  Continue regimen including Lasix, BiDil, labetalol, Benicar.

## 2017-06-27 NOTE — Assessment & Plan Note (Signed)
Ambien refilled today.  Database reviewed without red flags.  Of note, noticed the patient recently had prescription faxed from his old office yesterday to the pharmacy.  His pharmacy was called and told to disregard this fax.

## 2017-06-27 NOTE — Assessment & Plan Note (Signed)
Continue with rate control and anticoagulation.

## 2017-06-27 NOTE — Assessment & Plan Note (Addendum)
Slightly above goal today.  Per his report is usually at goal at home.  No medication changes needed today.  Follow-up at next office visit.  If persistently elevated would consider increasing dose of current medications versus adding additional agent such as spironolactone.

## 2017-06-27 NOTE — Patient Instructions (Addendum)
No changes today.   I would like to see you 1-2 times per year to check on your blood sugar and blood pressure.  Please let us know if you need anything else.  Take care,  Dr [name]

## 2017-06-27 NOTE — Assessment & Plan Note (Signed)
Encouraged abstinence.  

## 2017-07-02 NOTE — Progress Notes (Signed)
Cardiology Office Note:    Date:  07/05/2017   ID:  [name], DOB 1950/12/02, MRN [id number]  PCP:  Vivi Barrack, MD  Cardiologist:  Dr. Peter Martinique   Nephrologist: Dr. Sindy Messing Urologist: Dr. Risa Grill  Referring MD: Golden Circle, FNP   Chief Complaint  Patient presents with  . Atrial Fibrillation    History of Present Illness:     Douglas Ballard is a 66 y.o. male with a hx of PAF, malignant HTN, CKD, tobacco abuse, alcohol abuse, diabetes, colovesicular fistula. He was admitted in 8/15 with acute renal failure, sepsis, expressive aphasia, diastolic HF in AF with RVR. He developed acute delirium with alcohol withdrawal. He recovered from this and eventually had fistula repair. Echo demonstrated normal EF, moderate biatrial enlargement, mild LVH. Myoview demonstrated normal perfusion. He has been on Xarelto for anticoagulation. When seen in June 2017  he was back in atrial fibrillation with controlled ventricular rate.  He was seen in the ED in mid June 2017 with chest pain and hematuria. Cardiac enzymes were negative. D-dimer was negative.  Chest x-ray was unremarkable.    He did undergo a Myoview study that showed normal perfusion and EF 50%.  Echo showed normal LV function. He did convert to NSR at that time.  On follow up today he is feeling very well. He is exercising regularly - walking his dog. He is followed closely by nephrology and renal function has been stable.  He denies any chest pain, dyspnea, edema, palpitations, or dizziness. He notes when he saw primary care his pulse was irregular.    Past Medical History:  Diagnosis Date  . Alcohol withdrawal seizure ([place]) 06/28/2011   Alcohol withdraw seizure prior to admission is suspected from history given by family & Post ictal appearance in the ED.   Marland Kitchen Anxiety   . Atrial fibrillation ([place])   . Cancer [facility]) 2012   Prostate surgery  . Chronic diastolic CHF (congestive heart failure) ([place]) 11/01/2014   Echo  8/15: Mild LVH, EF 50-55%, moderate BAE  //  b. Echo 7/17: EF 55-60%, normal wall motion, grade 2 diastolic dysfunction, MAC, mild MR, moderate LAE, trivial PI  . Compression fracture of thoracic vertebra ([place]) 06/26/2011  . Diabetes mellitus type 2 in nonobese (HCC)   . Fistula, bladder   . Frequent urination at night   . History of adenomatous polyp of colon 06/14/2014  . History of nuclear stress test    a. Myoview 10/15: Overall Impression: Low risk stress nuclear study demonstrating mild baseline ST-T changes with normal myocardial perfusion and low normal EF of 50%. // b.Myoview 7/17: EF 50%, no ischemia or scar, low risk (EF normal by recent echo)  . Hypertension   . PNA (pneumonia) 06/26/2011  . Sepsis due to urinary tract infection ([place]) 04/16/2014  . Stroke ([place]) sept 1, 2015   tia x 3    Past Surgical History:  Procedure Laterality Date  . COLON SURGERY    . COLONOSCOPY N/A 06/14/2014   Performed by Gatha Mayer, MD at [facility]  . CYSTOSCOPY WITH BILATERAL STENT PLACEMENT Bilateral 06/15/2014   Performed by Bernestine Amass, MD at [facility] ORS  . Embolization N/A 07/28/2014   Performed by Luanne Bras, MD at [facility]  . LIPOMA EXCISION  2012   Dr Nonah Mattes  . MINMALLY INVASIVE(ROBOTIC/LAPAROSCOPIC)SIGMONID COLECTOMY AND takedown COLOVESICULAR FISTULA, omentopexy N/A 06/15/2014   Performed by Michael Boston, MD at [facility] ORS  . moles removed  Back  . RIGID PROCTOSCOPY N/A 06/15/2014   Performed by Michael Boston, MD at [facility]  . ROBOT ASSISTED LAPAROSCOPIC RADICAL PROSTATECTOMY  01/31/2011   Robotic-assisted laparoscopic radical retropubic    Current Medications: Outpatient Medications Prior to Visit  Medication Sig Dispense Refill  . cloNIDine (CATAPRES) 0.1 MG tablet Take 0.1 mg daily by mouth.     . diltiazem (CARDIZEM CD) 240 MG 24 hr capsule TAKE 1 CAPSULE EVERY       MORNING 90 capsule 2  . furosemide (LASIX) 20 MG tablet Take 1 tablet (20 mg total) by mouth  every other day. Take 1 tablet by mouth Monday, Wednesday, and Friday. 30 tablet 1  . isosorbide-hydrALAZINE (BIDIL) 20-37.5 MG tablet Take 2 tablets by mouth 2 (two) times daily.     Marland Kitchen labetalol (NORMODYNE) 200 MG tablet Take 2 tablets (400 mg total) by mouth 3 (three) times daily. 180 tablet 0  . Multiple Vitamins-Minerals (MULTIVITAMINS THER. W/MINERALS) TABS Take 1 tablet by mouth every morning.     . olmesartan (BENICAR) 40 MG tablet Take 1 tablet (40 mg total) by mouth every morning. 90 tablet 0  . Rivaroxaban (XARELTO) 15 MG TABS tablet Take 1 tablet (15 mg total) by mouth daily with supper. 90 tablet 1  . sitaGLIPtin (JANUVIA) 100 MG tablet Take 1 tablet (100 mg total) by mouth daily. 90 tablet 0  . zolpidem (AMBIEN) 10 MG tablet Take 1 tablet (10 mg total) at bedtime as needed by mouth. for sleep 90 tablet 1   No facility-administered medications prior to visit.       Allergies:   Hydrochlorothiazide; Lasix [furosemide]; Nsaids; and Sulfa antibiotics   Social History   Socioeconomic History  . Marital status: Married    Spouse name: None  . Number of children: 2  . Years of education: None  . Highest education level: None  Social Needs  . Financial resource strain: None  . Food insecurity - worry: None  . Food insecurity - inability: None  . Transportation needs - medical: None  . Transportation needs - non-medical: None  Occupational History  . Occupation: TEFL teacher  Tobacco Use  . Smoking status: Former Smoker    Packs/day: 2.00    Years: 25.00    Pack years: 50.00    Types: Cigarettes    Last attempt to quit: 04/14/2014    Years since quitting: 3.2  . Smokeless tobacco: Never Used  Substance and Sexual Activity  . Alcohol use: Yes    Alcohol/week: 4.2 - 6.0 oz    Types: 7 - 10 Standard drinks or equivalent per week    Comment: 2 bottles of wine with wife a week; cocktail every other night  . Drug use: No  . Sexual activity: Not Currently    Other Topics Concern  . None  Social History Narrative   Fun/Hobby: Walk, cards, gardening      Family History:  The patient's family history includes Atrial fibrillation in his father; Breast cancer in his mother; Colon polyps in his brother; Leukemia in his brother; Lung cancer in his mother; Prostate cancer in his father.   ROS:   Please see the history of present illness.     All other systems reviewed and are negative.   Physical Exam:    VS:  BP (!) 162/82   Pulse 84   Ht 5' 7.5" (1.715 m)   Wt 190 lb 6.4 oz (86.4 kg)   BMI 29.38 kg/m  GENERAL:  Well appearing HEENT:  PERRL, EOMI, sclera are clear. Oropharynx is clear. NECK:  No jugular venous distention, carotid upstroke brisk and symmetric, no bruits, no thyromegaly or adenopathy LUNGS:  Clear to auscultation bilaterally CHEST:  Unremarkable HEART:  IRRR,  PMI not displaced or sustained,S1 and S2 within normal limits, no S3, no S4: no clicks, no rubs, no murmurs ABD:  Soft, nontender. BS +, no masses or bruits. No hepatomegaly, no splenomegaly EXT:  2 + pulses throughout, no edema, no cyanosis no clubbing SKIN:  Warm and dry.  No rashes NEURO:  Alert and oriented x 3. Cranial nerves II through XII intact. PSYCH:  Cognitively intact    Wt Readings from Last 3 Encounters:  07/05/17 190 lb 6.4 oz (86.4 kg)  06/27/17 184 lb 9.6 oz (83.7 kg)  05/28/17 187 lb (84.8 kg)      Studies/Labs Reviewed:     EKG:  EKG is  ordered today.  Afib with rate 84. Low voltage. Possible old anterior infarct. I have personally reviewed and interpreted this study.   Recent Labs: 05/28/2017: ALT 6; BUN 32; Creatinine, Ser 2.29; Hemoglobin 12.8; Platelets 244.0; Potassium 4.3; Sodium 133   Recent Lipid Panel    Component Value Date/Time   CHOL 113 05/28/2017 1107   TRIG 49.0 05/28/2017 1107   HDL 59.50 05/28/2017 1107   CHOLHDL 2 05/28/2017 1107   VLDL 9.8 05/28/2017 1107   LDLCALC 44 05/28/2017 1107    Additional  studies/ records that were reviewed today include:   Labs from 6/31/17: sodium 126, BUN24, creatinine 2.41. Estimated GFR 27. Hgb 13.9. Dated 05/14/17: BUN 29, creatinine 2.06. Glucose 247.   Myoview 03/08/16 Study Highlights    Nuclear stress EF: 50%.  There was no ST segment deviation noted during stress.  This is a low risk study.  The left ventricular ejection fraction is mildly decreased (45-54%).   Low risk stress nuclear study with no ischemia or infarction; EF 50 with mild global hypokinesis and mild LVE; suggest echo to further assess LV function; patient in atrial fibrillation during the study.      Echo 02/24/16: Study Conclusions  - Left ventricle: The cavity size was normal. Systolic function was   normal. The estimated ejection fraction was in the range of 55%   to 60%. Wall motion was normal; there were no regional wall   motion abnormalities. Features are consistent with a pseudonormal   left ventricular filling pattern, with concomitant abnormal   relaxation and increased filling pressure (grade 2 diastolic   dysfunction). Doppler parameters are consistent with high   ventricular filling pressure. - Mitral valve: Calcified annulus. There was mild regurgitation. - Left atrium: The atrium was moderately dilated. - Right ventricle: The cavity size was mildly dilated. Wall   thickness was normal. - Pulmonic valve: There was trivial regurgitation.   ASSESSMENT:     1. Persistent atrial fibrillation ([place])   2. Essential hypertension   3. CKD (chronic kidney disease) stage 3, GFR 30-59 ml/min (HCC)     PLAN:     In order of problems listed above:  1. Atrial fibrillation. Probably persistent and permanent. He is asymptomatic and HR is well controlled on diltiazem and labetalol. He is anticoagulated appropriately on Xarelto. I would continue a rate control strategy since he is asymptomatic.   2. Chronic diastolic CHF. Appears well compensated today. On lasix  20 mg every other day. Sodium restriction.  3. HTN - He reports BP at home  is 161-096 systolic. His nephrologist has been adjusting his medications.   5. CKD - stage 3-4. Followed by nephrology.     Medication Adjustments/Labs and Tests Ordered: Current medicines are reviewed at length with the patient today.  Concerns regarding medicines are outlined above.  Medication changes, Labs and Tests ordered today are outlined in the Patient Instructions noted below. Patient Instructions  Continue your current therapy  Keep up your aerobic activity  I will see you in on year    Signed, Peter Martinique, MD  07/05/2017 11:02 AM    [address], [place], [place]  [address] Phone: [phone]; Fax: [phone]

## 2017-07-05 ENCOUNTER — Ambulatory Visit (INDEPENDENT_AMBULATORY_CARE_PROVIDER_SITE_OTHER): Payer: BC Managed Care – PPO | Admitting: Cardiology

## 2017-07-05 ENCOUNTER — Encounter: Payer: Self-pay | Admitting: Cardiology

## 2017-07-05 VITALS — BP 162/82 | HR 84 | Ht 67.5 in | Wt 190.4 lb

## 2017-07-05 DIAGNOSIS — I481 Persistent atrial fibrillation: Secondary | ICD-10-CM | POA: Diagnosis not present

## 2017-07-05 DIAGNOSIS — I10 Essential (primary) hypertension: Secondary | ICD-10-CM | POA: Diagnosis not present

## 2017-07-05 DIAGNOSIS — I4819 Other persistent atrial fibrillation: Secondary | ICD-10-CM

## 2017-07-05 DIAGNOSIS — N183 Chronic kidney disease, stage 3 unspecified: Secondary | ICD-10-CM

## 2017-07-05 NOTE — Patient Instructions (Addendum)
Continue your current therapy  Keep up your aerobic activity  I will see you in on year

## 2017-07-05 NOTE — Addendum Note (Signed)
Addended by: Kathyrn Lass on: 07/05/2017 11:19 AM   Modules accepted: Orders

## 2017-07-19 ENCOUNTER — Encounter: Payer: Self-pay | Admitting: Family Medicine

## 2017-07-22 ENCOUNTER — Other Ambulatory Visit: Payer: Self-pay

## 2017-07-22 MED ORDER — DILTIAZEM HCL ER COATED BEADS 240 MG PO CP24: 240.0000 mg | ORAL_CAPSULE | Freq: Every morning | ORAL | 1 refills | Status: DC

## 2017-08-30 ENCOUNTER — Other Ambulatory Visit: Payer: Self-pay | Admitting: Physician Assistant

## 2017-09-06 DIAGNOSIS — N183 Chronic kidney disease, stage 3 (moderate): Principal | ICD-10-CM

## 2017-09-06 DIAGNOSIS — I10 Essential (primary) hypertension: Secondary | ICD-10-CM | POA: Insufficient documentation

## 2017-09-06 DIAGNOSIS — E1122 Type 2 diabetes mellitus with diabetic chronic kidney disease: Secondary | ICD-10-CM | POA: Insufficient documentation

## 2017-09-15 ENCOUNTER — Other Ambulatory Visit: Payer: Self-pay | Admitting: Cardiology

## 2017-10-09 ENCOUNTER — Other Ambulatory Visit: Payer: Self-pay | Admitting: Cardiology

## 2017-10-09 DIAGNOSIS — N183 Chronic kidney disease, stage 3 unspecified: Secondary | ICD-10-CM

## 2017-10-09 DIAGNOSIS — I10 Essential (primary) hypertension: Secondary | ICD-10-CM

## 2017-10-09 DIAGNOSIS — I5032 Chronic diastolic (congestive) heart failure: Secondary | ICD-10-CM

## 2017-10-09 NOTE — Telephone Encounter (Signed)
REFILL 

## 2017-10-10 ENCOUNTER — Other Ambulatory Visit: Payer: Self-pay | Admitting: Physician Assistant

## 2017-12-29 ENCOUNTER — Other Ambulatory Visit: Payer: Self-pay | Admitting: Family

## 2018-01-07 ENCOUNTER — Other Ambulatory Visit: Payer: Self-pay | Admitting: Physician Assistant

## 2018-01-15 ENCOUNTER — Other Ambulatory Visit: Payer: Self-pay | Admitting: Family Medicine

## 2018-01-15 NOTE — Telephone Encounter (Signed)
Copied from [place] [id number]. Topic: Quick Communication - See Telephone Encounter >> Jan 15, 2018  3:41 PM Conception Chancy, NT wrote: CRM for notification. See Telephone encounter for: 01/15/18.  Patient is requesting a refill on sitaGLIPtin (JANUVIA) 100 MG tablet. Please advise.   [id number] [address], [place] [address] Phone: [phone] Fax: [phone]

## 2018-01-16 MED ORDER — SITAGLIPTIN PHOSPHATE 100 MG PO TABS: 100.0000 mg | ORAL_TABLET | Freq: Every day | ORAL | 0 refills | Status: DC

## 2018-01-16 NOTE — Telephone Encounter (Signed)
See note

## 2018-01-16 NOTE — Telephone Encounter (Signed)
30 day supply given. Pt needs OV for further refills.  [name]. [name], MD 01/16/2018 4:30 PM

## 2018-01-16 NOTE — Telephone Encounter (Signed)
Please advise.  You did not previously prescribe.

## 2018-01-16 NOTE — Telephone Encounter (Signed)
Rx refill from historical provider: Januvia 100 mg   Last filled: 12/23/16 #90   LOV: 06/27/17  PCP: [place]: [facility]

## 2018-01-17 ENCOUNTER — Other Ambulatory Visit: Payer: Self-pay | Admitting: Family Medicine

## 2018-01-22 LAB — BASIC METABOLIC PANEL
BUN: 30 — AB (ref 4–21)
CREATININE: 2.6 — AB (ref 0.6–1.3)
Glucose: 282
Potassium: 4.4 (ref 3.4–5.3)
Sodium: 133 — AB (ref 137–147)

## 2018-01-22 LAB — CBC AND DIFFERENTIAL
HCT: 35 — AB (ref 41–53)
HEMOGLOBIN: 11.8 — AB (ref 13.5–17.5)
PLATELETS: 243 (ref 150–399)
WBC: 5.3

## 2018-01-24 ENCOUNTER — Ambulatory Visit: Payer: BC Managed Care – PPO | Admitting: Family Medicine

## 2018-01-24 ENCOUNTER — Encounter: Payer: Self-pay | Admitting: Family Medicine

## 2018-01-24 VITALS — BP 130/78 | HR 55 | Temp 98.6°F | Ht 67.5 in | Wt 190.8 lb

## 2018-01-24 DIAGNOSIS — N183 Chronic kidney disease, stage 3 unspecified: Secondary | ICD-10-CM

## 2018-01-24 DIAGNOSIS — F5101 Primary insomnia: Secondary | ICD-10-CM | POA: Diagnosis not present

## 2018-01-24 DIAGNOSIS — E1122 Type 2 diabetes mellitus with diabetic chronic kidney disease: Secondary | ICD-10-CM | POA: Diagnosis not present

## 2018-01-24 DIAGNOSIS — E1159 Type 2 diabetes mellitus with other circulatory complications: Secondary | ICD-10-CM | POA: Diagnosis not present

## 2018-01-24 DIAGNOSIS — I10 Essential (primary) hypertension: Secondary | ICD-10-CM | POA: Diagnosis not present

## 2018-01-24 DIAGNOSIS — I152 Hypertension secondary to endocrine disorders: Secondary | ICD-10-CM

## 2018-01-24 LAB — POCT GLYCOSYLATED HEMOGLOBIN (HGB A1C): HEMOGLOBIN A1C: 7.1 % — AB (ref 4.0–5.6)

## 2018-01-24 MED ORDER — ZOLPIDEM TARTRATE 10 MG PO TABS: 10.0000 mg | ORAL_TABLET | Freq: Every evening | ORAL | 1 refills | Status: DC | PRN

## 2018-01-24 MED ORDER — ZOLPIDEM TARTRATE 10 MG PO TABS
10.0000 mg | ORAL_TABLET | Freq: Every evening | ORAL | 1 refills | Status: DC | PRN
Start: 2018-01-24 — End: 2018-01-24

## 2018-01-24 NOTE — Progress Notes (Signed)
   Subjective:  Douglas Ballard is a 67 y.o. male who presents today with a chief complaint of insomnia.   HPI:  Insomnia, chronic problem, stable Currently on Ambien 10 mg daily.  Does well with this without side effects.  Hypertension, chronic problem, stable Currently on clonidine 0.1 mg daily, diltiazem 240 mg daily, labetalol 400 mg 3 times daily, Lasix 20 mg every other day, and olmesartan 40 mg daily.  Tolerates all these well without side effects.  Type 2 diabetes, chronic problem, stable On Januvia 100 mg daily.  Tolerates this well without side effects.  ROS: Per HPI  PMH: He reports that he quit smoking about 3 years ago. His smoking use included cigarettes. He has a 50.00 pack-year smoking history. He has never used smokeless tobacco. He reports that he drinks about 4.2 - 6.0 oz of alcohol per week. He reports that he does not use drugs.   Objective:  Physical Exam: BP 130/78 (BP Location: Left Arm, Patient Position: Sitting, Cuff Size: Normal)   Pulse (!) 55   Temp 98.6 F (37 C) (Oral)   Ht 5' 7.5" (1.715 m)   Wt 190 lb 12.8 oz (86.5 kg)   SpO2 98%   BMI 29.44 kg/m   Gen: NAD, resting comfortably CV: RRR with no murmurs appreciated Pulm: NWOB, CTAB with no crackles, wheezes, or rhonchi   Assessment/Plan:  Type 2 diabetes mellitus with stage 3 chronic kidney disease, without long-term current use of insulin (HCC) A1c today 7.1.  Continue Januvia 100 mg daily.  Follow-up in 6 months.  Insomnia Doing well.  Ambien refilled.  Hypertension associated with diabetes ([place]) Stable. Continue current medications.    Preventive health care Patient will follow-up soon for his CPE.  [name]. [name], MD 01/24/2018 5:43 PM

## 2018-01-24 NOTE — Assessment & Plan Note (Signed)
Stable.  Continue current medications.

## 2018-01-24 NOTE — Patient Instructions (Signed)
It was very nice to see you today!  Your blood pressure and A1c look good. Keep up the good work!  I will refill your Lorrin Mais today.  Come back in 6 months for your physical with blood work, or sooner as needed.  Take care, Dr [name]

## 2018-01-24 NOTE — Assessment & Plan Note (Signed)
A1c today 7.1.  Continue Januvia 100 mg daily.  Follow-up in 6 months.

## 2018-01-24 NOTE — Assessment & Plan Note (Signed)
Doing well.  Ambien refilled.

## 2018-01-28 ENCOUNTER — Encounter: Payer: Self-pay | Admitting: Family Medicine

## 2018-02-18 ENCOUNTER — Other Ambulatory Visit: Payer: Self-pay

## 2018-02-18 ENCOUNTER — Telehealth: Payer: Self-pay | Admitting: Family Medicine

## 2018-02-18 MED ORDER — SITAGLIPTIN PHOSPHATE 100 MG PO TABS: 100.0000 mg | ORAL_TABLET | Freq: Every day | ORAL | 1 refills | Status: DC

## 2018-02-18 NOTE — Telephone Encounter (Signed)
Copied from [place] [id number]. Topic: Quick Communication - Rx Refill/Question >> Feb 18, 2018 10:39 AM Percell Belt A wrote: Medication: sitaGLIPtin (JANUVIA) 100 MG tablet [[id number]] -  pt is requesting more then just 30 day refill   Has the patient contacted their pharmacy? {no  (Agent: If no, request that the patient contact the pharmacy for the refill.) (Agent: If yes, when and what did the pharmacy advise?)  Preferred Pharmacy (with phone number or street name): [id number] [place], [place] [phone] (Phone)   Agent: Please be advised that RX refills may take up to 3 business days. We ask that you follow-up with your pharmacy.

## 2018-02-18 NOTE — Telephone Encounter (Signed)
Refill sent to pharmacy, #90, 1 refill.

## 2018-02-18 NOTE — Telephone Encounter (Signed)
See note

## 2018-02-18 NOTE — Telephone Encounter (Signed)
Medication quantity refill request - Patient is requesting more than just 30 day refill on Januvia 100 mg tab  LOV 01-24-2018 Dr [name]  Last Filled 01/16/2018 30 tabs Nr 1 tab daily Dr [name]   Pharmacy requested [facility]

## 2018-04-04 ENCOUNTER — Other Ambulatory Visit: Payer: Self-pay | Admitting: Cardiology

## 2018-04-29 LAB — BASIC METABOLIC PANEL
BUN: 34 — AB (ref 4–21)
Creatinine: 2.4 — AB (ref 0.6–1.3)
GLUCOSE: 230
Potassium: 4.5 (ref 3.4–5.3)
SODIUM: 132 — AB (ref 137–147)

## 2018-04-29 LAB — CBC AND DIFFERENTIAL
HEMATOCRIT: 42 (ref 41–53)
HEMOGLOBIN: 14.4 (ref 13.5–17.5)
Platelets: 254 (ref 150–399)
WBC: 7.8

## 2018-05-15 ENCOUNTER — Encounter: Payer: Self-pay | Admitting: Physical Therapy

## 2018-07-25 ENCOUNTER — Other Ambulatory Visit: Payer: Self-pay | Admitting: Physician Assistant

## 2018-07-27 ENCOUNTER — Other Ambulatory Visit: Payer: Self-pay | Admitting: Family Medicine

## 2018-07-31 ENCOUNTER — Other Ambulatory Visit: Payer: Self-pay | Admitting: Family Medicine

## 2018-07-31 DIAGNOSIS — F5101 Primary insomnia: Secondary | ICD-10-CM

## 2018-08-01 ENCOUNTER — Encounter: Payer: Self-pay | Admitting: Family Medicine

## 2018-08-01 ENCOUNTER — Ambulatory Visit: Payer: BC Managed Care – PPO | Admitting: Family Medicine

## 2018-08-01 VITALS — BP 128/74 | HR 67 | Temp 97.9°F | Ht 68.0 in | Wt 184.6 lb

## 2018-08-01 DIAGNOSIS — N183 Chronic kidney disease, stage 3 (moderate): Secondary | ICD-10-CM

## 2018-08-01 DIAGNOSIS — I152 Hypertension secondary to endocrine disorders: Secondary | ICD-10-CM

## 2018-08-01 DIAGNOSIS — E1159 Type 2 diabetes mellitus with other circulatory complications: Secondary | ICD-10-CM

## 2018-08-01 DIAGNOSIS — I480 Paroxysmal atrial fibrillation: Secondary | ICD-10-CM | POA: Diagnosis not present

## 2018-08-01 DIAGNOSIS — I10 Essential (primary) hypertension: Secondary | ICD-10-CM

## 2018-08-01 DIAGNOSIS — E1122 Type 2 diabetes mellitus with diabetic chronic kidney disease: Secondary | ICD-10-CM | POA: Diagnosis not present

## 2018-08-01 DIAGNOSIS — G4700 Insomnia, unspecified: Secondary | ICD-10-CM | POA: Diagnosis not present

## 2018-08-01 DIAGNOSIS — F439 Reaction to severe stress, unspecified: Secondary | ICD-10-CM | POA: Insufficient documentation

## 2018-08-01 LAB — POCT GLYCOSYLATED HEMOGLOBIN (HGB A1C): Hemoglobin A1C: 6.2 % — AB (ref 4.0–5.6)

## 2018-08-01 MED ORDER — HYDROXYZINE HCL 50 MG PO TABS: 25.0000 mg | ORAL_TABLET | Freq: Every evening | ORAL | 1 refills | Status: DC | PRN

## 2018-08-01 NOTE — Telephone Encounter (Signed)
Please advise 

## 2018-08-01 NOTE — Assessment & Plan Note (Signed)
Discussed treatment options.  Will avoid benzos given concurrent Ambien use.  Also avoid due to alcohol abuse history.  Start hydroxyzine 25 to 100 mg 3 times daily as needed.

## 2018-08-01 NOTE — Patient Instructions (Signed)
It was very nice to see you today!  Please try the hydroxyzine as needed for anxiety.  No other changes today.  Come back to see me in 6 months, or sooner as needed.   Take care, Dr [name]

## 2018-08-01 NOTE — Assessment & Plan Note (Signed)
A1c improved to 6.2 today.  Congratulated patient on his hard work.  Will continue Januvia 100 mg daily.  Foot exam performed today.  Follow-up with me in 6 months.

## 2018-08-01 NOTE — Assessment & Plan Note (Signed)
Irregular today.  Rate controlled.  Continue Xarelto 15 mg daily.

## 2018-08-01 NOTE — Progress Notes (Signed)
   Subjective:  Douglas Ballard is a 67 y.o. male who presents today with a chief complaint of T2DM.   HPI:   His stable, chronic medications are outlined below:  1. T2DM.  Last seen 6 months ago.  On Januvia 100 mg daily.  Tolerating well.  No side effects.  No polyuria polydipsia.  He has been walking more and trying to cut back on carbs and sugary foods. 2.  Hypertension.  Currently on clonidine 0.1 mg daily, Cardizem 240 milligrams daily, Lasix 20 mg every other day, 2 tablets twice daily, labetalol 400 mg 3 times daily, Benicar 4 mg daily.  Tolerating all these well.  Follows with cardiology for his paroxysmal A. Fib. 3. Insomnia. On ambien 10mg  nightly. Tolerating well.  He has 1 new problem today:  1. Stress.  Patient is under a lot of stress that is worsened recently.  His wife has been ill with a severe leg fracture which is limited, she has been able to do around the house.  Overall feels like he is managing it okay however he occasionally has very stressful days at work and would like to take a medication to help calm him down as needed.  He has been on some medications in the past for this but does not remember the name.  Does not want a daily medication.  ROS: Per HPI  PMH: He reports that he quit smoking about 4 years ago. His smoking use included cigarettes. He has a 50.00 pack-year smoking history. He has never used smokeless tobacco. He reports current alcohol use of about 7.0 - 10.0 standard drinks of alcohol per week. He reports that he does not use drugs.  Objective:  Physical Exam: BP 128/74 (BP Location: Left Arm, Patient Position: Sitting, Cuff Size: Normal)   Pulse 67   Temp 97.9 F (36.6 C) (Oral)   Ht 5\' 8"  (1.727 m)   Wt 184 lb 9.6 oz (83.7 kg)   SpO2 98%   BMI 28.07 kg/m   Gen: NAD, resting comfortably CV: Irregular with no murmurs appreciated Pulm: NWOB, CTAB with no crackles, wheezes, or rhonchi  Results for orders placed or performed in visit on  08/01/18 (from the past 24 hour(s))  POCT glycosylated hemoglobin (Hb A1C)     Status: Abnormal   Collection Time: 08/01/18  9:31 AM  Result Value Ref Range   Hemoglobin A1C 6.2 (A) 4.0 - 5.6 %    Assessment/Plan:  Type 2 diabetes mellitus with stage 3 chronic kidney disease, without long-term current use of insulin (HCC) A1c improved to 6.2 today.  Congratulated patient on his hard work.  Will continue Januvia 100 mg daily.  Foot exam performed today.  Follow-up with me in 6 months.  Paroxysmal atrial fibrillation Irregular today.  Rate controlled.  Continue Xarelto 15 mg daily.  Stress Discussed treatment options.  Will avoid benzos given concurrent Ambien use.  Also avoid due to alcohol abuse history.  Start hydroxyzine 25 to 100 mg 3 times daily as needed.  Insomnia Stable.  Ambien refilled.  Hypertension associated with diabetes ([place]) At goal.  Continue clonidine 0.1 mg daily, Cardizem 240 mg daily, Lasix 20 mg every other day, BiDil 2 tablets twice daily, labetalol 400 mg 3 times daily, olmesartan 40 mg daily.  [name]. [name], MD 08/01/2018 12:24 PM

## 2018-08-01 NOTE — Assessment & Plan Note (Signed)
At goal.  Continue clonidine 0.1 mg daily, Cardizem 240 mg daily, Lasix 20 mg every other day, BiDil 2 tablets twice daily, labetalol 400 mg 3 times daily, olmesartan 40 mg daily.

## 2018-08-01 NOTE — Assessment & Plan Note (Signed)
Stable.  Ambien refilled. 

## 2018-08-29 ENCOUNTER — Ambulatory Visit: Payer: BC Managed Care – PPO | Admitting: Family Medicine

## 2018-08-29 ENCOUNTER — Ambulatory Visit: Payer: Self-pay

## 2018-08-29 ENCOUNTER — Ambulatory Visit: Payer: BC Managed Care – PPO | Admitting: Physician Assistant

## 2018-08-29 DIAGNOSIS — Z0289 Encounter for other administrative examinations: Secondary | ICD-10-CM

## 2018-08-29 NOTE — Telephone Encounter (Signed)
Pt c/o severe SOB last night. Pt stated it felt like I couldn't catch my breath. Pt stated this has been an ongoing, intermittent problem. Pt also c/o dry cough, chest pain since October. Pt described the feeling of it feels like he's "drowning." Pt given care advice and pt verbalized understanding. Pt offered an appointment with his PCP but could not come at that particular time. Pt given appt with Inda Coke  PA today at 1:40 pm.  Reason for Disposition . [1] MILD longstanding difficulty breathing AND [2]  SAME as normal  Answer Assessment - Initial Assessment Questions 1. RESPIRATORY STATUS: "Describe your breathing?" (e.g., wheezing, shortness of breath, unable to speak, severe coughing)      Shortness of breath that comes and goes 2. ONSET: "When did this breathing problem begin?"      October 3. PATTERN "Does the difficult breathing come and go, or has it been constant since it started?"      Comes and goes 4. SEVERITY: "How bad is your breathing?" (e.g., mild, moderate, severe)    - MILD: No SOB at rest, mild SOB with walking, speaks normally in sentences, can lay down, no retractions, pulse < 100.    - MODERATE: SOB at rest, SOB with minimal exertion and prefers to sit, cannot lie down flat, speaks in phrases, mild retractions, audible wheezing, pulse 100-120.    - SEVERE: Very SOB at rest, speaks in single words, struggling to breathe, sitting hunched forward, retractions, pulse > 120      Mild- feels like he's drowning at times 5. RECURRENT SYMPTOM: "Have you had difficulty breathing before?" If so, ask: "When was the last time?" and "What happened that time?"      Has had SOB since October that comes and goes. 6. CARDIAC HISTORY: "Do you have any history of heart disease?" (e.g., heart attack, angina, bypass surgery, angioplasty)      np 7. LUNG HISTORY: "Do you have any history of lung disease?"  (e.g., pulmonary embolus, asthma, emphysema)     Smoker for 25 years  Quit for 6  years ago 4. CAUSE: "What do you think is causing the breathing problem?"     Pt doesn't know the issue  9. OTHER SYMPTOMS: "Do you have any other symptoms? (e.g., dizziness, runny nose, cough, chest pain, fever)     Dry cough, chest pain being "hugged from behind" has been intermittent since October 10. PREGNANCY: "Is there any chance you are pregnant?" "When was your last menstrual period?"       n/a 11. TRAVEL: "Have you traveled out of the country in the last month?" (e.g., travel history, exposures)       no  Protocols used: BREATHING DIFFICULTY-A-AH

## 2018-08-31 ENCOUNTER — Other Ambulatory Visit: Payer: Self-pay

## 2018-08-31 ENCOUNTER — Encounter (HOSPITAL_COMMUNITY): Payer: Self-pay | Admitting: Emergency Medicine

## 2018-08-31 ENCOUNTER — Emergency Department (HOSPITAL_COMMUNITY): Payer: BC Managed Care – PPO

## 2018-08-31 ENCOUNTER — Observation Stay (HOSPITAL_COMMUNITY)
Admission: EM | Admit: 2018-08-31 | Discharge: 2018-09-02 | Disposition: A | Discharge status: Home / Self Care | Payer: BC Managed Care – PPO | Attending: Internal Medicine | Admitting: Internal Medicine

## 2018-08-31 ENCOUNTER — Observation Stay (HOSPITAL_BASED_OUTPATIENT_CLINIC_OR_DEPARTMENT_OTHER): Payer: BC Managed Care – PPO

## 2018-08-31 DIAGNOSIS — I152 Hypertension secondary to endocrine disorders: Secondary | ICD-10-CM | POA: Diagnosis present

## 2018-08-31 DIAGNOSIS — E871 Hypo-osmolality and hyponatremia: Secondary | ICD-10-CM | POA: Insufficient documentation

## 2018-08-31 DIAGNOSIS — Z7901 Long term (current) use of anticoagulants: Secondary | ICD-10-CM | POA: Diagnosis not present

## 2018-08-31 DIAGNOSIS — I5031 Acute diastolic (congestive) heart failure: Secondary | ICD-10-CM | POA: Diagnosis not present

## 2018-08-31 DIAGNOSIS — N183 Chronic kidney disease, stage 3 unspecified: Secondary | ICD-10-CM | POA: Diagnosis present

## 2018-08-31 DIAGNOSIS — E876 Hypokalemia: Secondary | ICD-10-CM | POA: Insufficient documentation

## 2018-08-31 DIAGNOSIS — F419 Anxiety disorder, unspecified: Secondary | ICD-10-CM | POA: Insufficient documentation

## 2018-08-31 DIAGNOSIS — E1159 Type 2 diabetes mellitus with other circulatory complications: Secondary | ICD-10-CM | POA: Diagnosis not present

## 2018-08-31 DIAGNOSIS — I340 Nonrheumatic mitral (valve) insufficiency: Secondary | ICD-10-CM

## 2018-08-31 DIAGNOSIS — I480 Paroxysmal atrial fibrillation: Secondary | ICD-10-CM

## 2018-08-31 DIAGNOSIS — I361 Nonrheumatic tricuspid (valve) insufficiency: Secondary | ICD-10-CM | POA: Diagnosis not present

## 2018-08-31 DIAGNOSIS — I10 Essential (primary) hypertension: Secondary | ICD-10-CM

## 2018-08-31 DIAGNOSIS — R0602 Shortness of breath: Secondary | ICD-10-CM | POA: Diagnosis present

## 2018-08-31 DIAGNOSIS — E1122 Type 2 diabetes mellitus with diabetic chronic kidney disease: Secondary | ICD-10-CM | POA: Diagnosis not present

## 2018-08-31 DIAGNOSIS — N189 Chronic kidney disease, unspecified: Secondary | ICD-10-CM

## 2018-08-31 DIAGNOSIS — D631 Anemia in chronic kidney disease: Secondary | ICD-10-CM

## 2018-08-31 DIAGNOSIS — Z79899 Other long term (current) drug therapy: Secondary | ICD-10-CM | POA: Insufficient documentation

## 2018-08-31 DIAGNOSIS — I509 Heart failure, unspecified: Secondary | ICD-10-CM

## 2018-08-31 DIAGNOSIS — Z8673 Personal history of transient ischemic attack (TIA), and cerebral infarction without residual deficits: Secondary | ICD-10-CM | POA: Insufficient documentation

## 2018-08-31 DIAGNOSIS — I130 Hypertensive heart and chronic kidney disease with heart failure and stage 1 through stage 4 chronic kidney disease, or unspecified chronic kidney disease: Secondary | ICD-10-CM | POA: Diagnosis not present

## 2018-08-31 LAB — BASIC METABOLIC PANEL
ANION GAP: 10 (ref 5–15)
BUN: 33 mg/dL — ABNORMAL HIGH (ref 8–23)
CO2: 19 mmol/L — ABNORMAL LOW (ref 22–32)
Calcium: 8.1 mg/dL — ABNORMAL LOW (ref 8.9–10.3)
Chloride: 98 mmol/L (ref 98–111)
Creatinine, Ser: 2.42 mg/dL — ABNORMAL HIGH (ref 0.61–1.24)
GFR calc non Af Amer: 27 mL/min — ABNORMAL LOW (ref >60)
GFR, EST AFRICAN AMERICAN: 31 mL/min — AB (ref >60)
Glucose, Bld: 153 mg/dL — ABNORMAL HIGH (ref 70–99)
Potassium: 4.1 mmol/L (ref 3.5–5.1)
Sodium: 127 mmol/L — ABNORMAL LOW (ref 135–145)

## 2018-08-31 LAB — CBC WITH DIFFERENTIAL/PLATELET
Abs Immature Granulocytes: 0.05 10*3/uL (ref 0.00–0.07)
BASOS PCT: 0 %
Basophils Absolute: 0 10*3/uL (ref 0.0–0.1)
Eosinophils Absolute: 0.1 10*3/uL (ref 0.0–0.5)
Eosinophils Relative: 1 %
HCT: 27.9 % — ABNORMAL LOW (ref 39.0–52.0)
Hemoglobin: 9.4 g/dL — ABNORMAL LOW (ref 13.0–17.0)
Immature Granulocytes: 0 %
Lymphocytes Relative: 3 %
Lymphs Abs: 0.4 10*3/uL — ABNORMAL LOW (ref 0.7–4.0)
MCH: 33.3 pg (ref 26.0–34.0)
MCHC: 33.7 g/dL (ref 30.0–36.0)
MCV: 98.9 fL (ref 80.0–100.0)
Monocytes Absolute: 1.6 10*3/uL — ABNORMAL HIGH (ref 0.1–1.0)
Monocytes Relative: 14 %
NRBC: 0 % (ref 0.0–0.2)
Neutro Abs: 9.4 10*3/uL — ABNORMAL HIGH (ref 1.7–7.7)
Neutrophils Relative %: 82 %
Platelets: 237 10*3/uL (ref 150–400)
RBC: 2.82 MIL/uL — ABNORMAL LOW (ref 4.22–5.81)
RDW: 11.7 % (ref 11.5–15.5)
WBC: 11.6 10*3/uL — ABNORMAL HIGH (ref 4.0–10.5)

## 2018-08-31 LAB — BRAIN NATRIURETIC PEPTIDE: B Natriuretic Peptide: 779.5 pg/mL — ABNORMAL HIGH (ref 0.0–100.0)

## 2018-08-31 LAB — ECHOCARDIOGRAM COMPLETE
Height: 69 in
Weight: 2936 oz

## 2018-08-31 LAB — GLUCOSE, CAPILLARY
Glucose-Capillary: 168 mg/dL — ABNORMAL HIGH (ref 70–99)
Glucose-Capillary: 136 mg/dL — ABNORMAL HIGH (ref 70–99)

## 2018-08-31 LAB — I-STAT TROPONIN, ED: Troponin i, poc: 0.02 ng/mL (ref 0.00–0.08)

## 2018-08-31 MED ORDER — FUROSEMIDE 10 MG/ML IJ SOLN
40.0000 mg | Freq: Two times a day (BID) | INTRAMUSCULAR | Status: DC
  Administered 2018-08-31 – 2018-09-01 (×2): 40 mg via INTRAVENOUS
  Filled 2018-08-31 (×2): qty 4

## 2018-08-31 MED ORDER — NITROGLYCERIN 0.4 MG SL SUBL
0.4000 mg | SUBLINGUAL_TABLET | SUBLINGUAL | Status: AC | PRN
  Administered 2018-08-31 (×3): 0.4 mg via SUBLINGUAL
  Filled 2018-08-31 (×3): qty 1

## 2018-08-31 MED ORDER — FUROSEMIDE 10 MG/ML IJ SOLN
80.0000 mg | Freq: Once | INTRAMUSCULAR | Status: AC
  Administered 2018-08-31: 80 mg via INTRAVENOUS
  Filled 2018-08-31: qty 8

## 2018-08-31 MED ORDER — DILTIAZEM HCL ER COATED BEADS 240 MG PO CP24
240.0000 mg | ORAL_CAPSULE | Freq: Once | ORAL | Status: AC
  Administered 2018-08-31: 240 mg via ORAL
  Filled 2018-08-31: qty 1

## 2018-08-31 MED ORDER — CLONIDINE HCL 0.1 MG PO TABS
0.1000 mg | ORAL_TABLET | Freq: Once | ORAL | Status: AC
  Administered 2018-08-31: 0.1 mg via ORAL
  Filled 2018-08-31: qty 1

## 2018-08-31 MED ORDER — DILTIAZEM HCL ER COATED BEADS 240 MG PO CP24
240.0000 mg | ORAL_CAPSULE | Freq: Every day | ORAL | Status: DC
  Administered 2018-09-01 – 2018-09-02 (×2): 240 mg via ORAL
  Filled 2018-08-31 (×2): qty 1

## 2018-08-31 MED ORDER — HYDROXYZINE HCL 25 MG PO TABS
25.0000 mg | ORAL_TABLET | Freq: Every evening | ORAL | Status: DC | PRN
  Administered 2018-08-31: 100 mg via ORAL
  Administered 2018-08-31: 25 mg via ORAL
  Administered 2018-09-02: 100 mg via ORAL
  Filled 2018-08-31: qty 2
  Filled 2018-08-31 (×2): qty 4

## 2018-08-31 MED ORDER — SODIUM CHLORIDE 0.9% FLUSH: 3.0000 mL | INTRAVENOUS | Status: DC | PRN

## 2018-08-31 MED ORDER — INSULIN ASPART 100 UNIT/ML ~~LOC~~ SOLN
0.0000 [IU] | Freq: Every day | SUBCUTANEOUS | Status: DC
  Administered 2018-09-01: 2 [IU] via SUBCUTANEOUS

## 2018-08-31 MED ORDER — RIVAROXABAN 15 MG PO TABS
15.0000 mg | ORAL_TABLET | Freq: Every day | ORAL | Status: DC
  Administered 2018-08-31 – 2018-09-01 (×2): 15 mg via ORAL
  Filled 2018-08-31 (×2): qty 1

## 2018-08-31 MED ORDER — LABETALOL HCL 200 MG PO TABS
200.0000 mg | ORAL_TABLET | Freq: Once | ORAL | Status: AC
  Administered 2018-08-31: 200 mg via ORAL
  Filled 2018-08-31: qty 1

## 2018-08-31 MED ORDER — ONDANSETRON HCL 4 MG/2ML IJ SOLN: 4.0000 mg | Freq: Four times a day (QID) | INTRAMUSCULAR | Status: DC | PRN

## 2018-08-31 MED ORDER — ACETAMINOPHEN 325 MG PO TABS: 650.0000 mg | ORAL_TABLET | ORAL | Status: DC | PRN

## 2018-08-31 MED ORDER — SODIUM CHLORIDE 0.9 % IV SOLN: 250.0000 mL | INTRAVENOUS | Status: DC | PRN

## 2018-08-31 MED ORDER — INSULIN ASPART 100 UNIT/ML ~~LOC~~ SOLN
0.0000 [IU] | Freq: Three times a day (TID) | SUBCUTANEOUS | Status: DC
  Administered 2018-08-31: 2 [IU] via SUBCUTANEOUS
  Administered 2018-09-01: 3 [IU] via SUBCUTANEOUS
  Administered 2018-09-01: 1 [IU] via SUBCUTANEOUS
  Administered 2018-09-01 – 2018-09-02 (×2): 2 [IU] via SUBCUTANEOUS

## 2018-08-31 MED ORDER — LISINOPRIL 5 MG PO TABS
5.0000 mg | ORAL_TABLET | Freq: Every day | ORAL | Status: DC
  Administered 2018-08-31: 5 mg via ORAL
  Filled 2018-08-31: qty 1

## 2018-08-31 MED ORDER — ISOSORB DINITRATE-HYDRALAZINE 20-37.5 MG PO TABS
2.0000 | ORAL_TABLET | Freq: Three times a day (TID) | ORAL | Status: DC
  Administered 2018-08-31 – 2018-09-02 (×6): 2 via ORAL
  Filled 2018-08-31 (×7): qty 2

## 2018-08-31 MED ORDER — ISOSORB DINITRATE-HYDRALAZINE 20-37.5 MG PO TABS
2.0000 | ORAL_TABLET | Freq: Once | ORAL | Status: AC
  Administered 2018-08-31: 2 via ORAL
  Filled 2018-08-31: qty 2

## 2018-08-31 MED ORDER — ASPIRIN 81 MG PO CHEW
324.0000 mg | CHEWABLE_TABLET | Freq: Once | ORAL | Status: AC
  Administered 2018-08-31: 324 mg via ORAL
  Filled 2018-08-31: qty 4

## 2018-08-31 MED ORDER — LABETALOL HCL 200 MG PO TABS
200.0000 mg | ORAL_TABLET | Freq: Two times a day (BID) | ORAL | Status: DC
  Administered 2018-08-31 – 2018-09-02 (×4): 200 mg via ORAL
  Filled 2018-08-31 (×4): qty 1

## 2018-08-31 MED ORDER — SODIUM CHLORIDE 0.9% FLUSH
3.0000 mL | Freq: Two times a day (BID) | INTRAVENOUS | Status: DC
  Administered 2018-08-31 – 2018-09-02 (×4): 3 mL via INTRAVENOUS

## 2018-08-31 NOTE — ED Notes (Signed)
Pt reports feeling improvement in SOB.

## 2018-08-31 NOTE — ED Triage Notes (Signed)
Pt c/o SOB since Friday. Denies cough, chest pains, started off just with exertion then now all the time.

## 2018-08-31 NOTE — ED Notes (Signed)
ED TO INPATIENT HANDOFF REPORT  Name/Age/Gender Douglas Ballard 68 y.o. male  Code Status Code Status History    Date Active Date Inactive Code Status Order ID Comments User Context   07/28/2014 1107 07/29/2014 0339 Full Code [id number]  Luanne Bras, MD HOV   06/15/2014 1352 06/20/2014 1545 Full Code [id number]  Michael Boston, MD Inpatient   04/14/2014 0931 04/23/2014 1731 Full Code [id number]  Caren Griffins, MD ED   06/26/2011 1355 07/02/2011 1250 Full Code [id number]  Verlee Monte, MD ED    Advance Directive Documentation     Most Recent Value  Type of Advance Directive  Healthcare Power of Attorney, Living will  Pre-existing out of facility DNR order (yellow form or pink MOST form)  -  "MOST" Form in Place?  -      Home/SNF/Other Home  Chief Complaint resp distress  Level of Care/Admitting Diagnosis ED Disposition    ED Disposition Condition [place]: [place] [100102]  Level of Care: Telemetry [5]  Admit to tele based on following criteria: Acute CHF  Diagnosis: Acute exacerbation of CHF (congestive heart failure) [facility]) [[id number]]  Admitting Physician: Marcell Anger [[id number]]  Attending Physician: Marcell Anger [phone]  PT Class (Do Not Modify): Observation [104]  PT Acc Code (Do Not Modify): Observation [10022]       Medical History Past Medical History:  Diagnosis Date  . Alcohol withdrawal seizure ([place]) 06/28/2011   Alcohol withdraw seizure prior to admission is suspected from history given by family & Post ictal appearance in the ED.   Marland Kitchen Anxiety   . Atrial fibrillation ([place])   . Cancer [facility]) 2012   Prostate surgery  . Chronic diastolic CHF (congestive heart failure) ([place]) 11/01/2014   Echo 8/15: Mild LVH, EF 50-55%, moderate BAE  //  b. Echo 7/17: EF 55-60%, normal wall motion, grade 2 diastolic dysfunction, MAC, mild MR, moderate LAE, trivial PI  . Compression fracture of thoracic vertebra  ([place]) 06/26/2011  . Diabetes mellitus type 2 in nonobese (HCC)   . Fistula, bladder   . Frequent urination at night   . History of adenomatous polyp of colon 06/14/2014  . History of nuclear stress test    a. Myoview 10/15: Overall Impression: Low risk stress nuclear study demonstrating mild baseline ST-T changes with normal myocardial perfusion and low normal EF of 50%. // b.Myoview 7/17: EF 50%, no ischemia or scar, low risk (EF normal by recent echo)  . Hypertension   . PNA (pneumonia) 06/26/2011  . Sepsis due to urinary tract infection ([place]) 04/16/2014  . Stroke ([place]) sept 1, 2015   tia x 3    Allergies Allergies  Allergen Reactions  . Hydrochlorothiazide Other (See Comments)    Pt gets hyponatremia  . Lasix [Furosemide] Other (See Comments)    Sodium levels drop when take  . Nsaids Other (See Comments)    Kidney disease/failure  . Sulfa Antibiotics Other (See Comments)    headaches    IV Location/Drains/Wounds Patient Lines/Drains/Airways Status   Active Line/Drains/Airways    Name:   Placement date:   Placement time:   Site:   Days:   Peripheral IV 08/31/18 Left Forearm   08/31/18    0930    Forearm   less than 1          Labs/Imaging Results for orders placed or performed during the hospital encounter of 08/31/18 (from the past 48 hour(s))  Basic metabolic panel  Status: Abnormal   Collection Time: 08/31/18  9:36 AM  Result Value Ref Range   Sodium 127 (L) 135 - 145 mmol/L   Potassium 4.1 3.5 - 5.1 mmol/L   Chloride 98 98 - 111 mmol/L   CO2 19 (L) 22 - 32 mmol/L   Glucose, Bld 153 (H) 70 - 99 mg/dL   BUN 33 (H) 8 - 23 mg/dL   Creatinine, Ser 2.42 (H) 0.61 - 1.24 mg/dL   Calcium 8.1 (L) 8.9 - 10.3 mg/dL   GFR calc non Af Amer 27 (L) >60 mL/min   GFR calc Af Amer 31 (L) >60 mL/min   Anion gap 10 5 - 15    Comment: Performed at [facility], [place] [address] [place], [place] [address]  CBC WITH DIFFERENTIAL     Status: Abnormal    Collection Time: 08/31/18  9:36 AM  Result Value Ref Range   WBC 11.6 (H) 4.0 - 10.5 K/uL   RBC 2.82 (L) 4.22 - 5.81 MIL/uL   Hemoglobin 9.4 (L) 13.0 - 17.0 g/dL   HCT 27.9 (L) 39.0 - 52.0 %   MCV 98.9 80.0 - 100.0 fL   MCH 33.3 26.0 - 34.0 pg   MCHC 33.7 30.0 - 36.0 g/dL   RDW 11.7 11.5 - 15.5 %   Platelets 237 150 - 400 K/uL   nRBC 0.0 0.0 - 0.2 %   Neutrophils Relative % 82 %   Neutro Abs 9.4 (H) 1.7 - 7.7 K/uL   Lymphocytes Relative 3 %   Lymphs Abs 0.4 (L) 0.7 - 4.0 K/uL   Monocytes Relative 14 %   Monocytes Absolute 1.6 (H) 0.1 - 1.0 K/uL   Eosinophils Relative 1 %   Eosinophils Absolute 0.1 0.0 - 0.5 K/uL   Basophils Relative 0 %   Basophils Absolute 0.0 0.0 - 0.1 K/uL   Immature Granulocytes 0 %   Abs Immature Granulocytes 0.05 0.00 - 0.07 K/uL    Comment: Performed at [facility], [place] [address] [place], [place] [address]  I-stat troponin, ED  (not at [facility], [facility])     Status: None   Collection Time: 08/31/18  9:36 AM  Result Value Ref Range   Troponin i, poc 0.02 0.00 - 0.08 ng/mL   Comment 3            Comment: Due to the release kinetics of cTnI, a negative result within the first hours of the onset of symptoms does not rule out myocardial infarction with certainty. If myocardial infarction is still suspected, repeat the test at appropriate intervals.   Brain natriuretic peptide     Status: Abnormal   Collection Time: 08/31/18  9:36 AM  Result Value Ref Range   B Natriuretic Peptide 779.5 (H) 0.0 - 100.0 pg/mL    Comment: Performed at [facility], [place] [address], [place], [place] [address]   Dg Chest 2 View  Result Date: 08/31/2018 CLINICAL DATA:  Shortness of breath EXAM: CHEST - 2 VIEW COMPARISON:  Chest radiograph 04/19/2017 FINDINGS: Monitoring leads overlie the patient. Stable cardiomegaly. Bilateral interstitial pulmonary opacities. Small bilateral pleural effusions. Thoracic spine degenerative changes. IMPRESSION:  Cardiomegaly, mild interstitial edema and small effusions. Electronically Signed   By: Lovey Newcomer M.D.   On: 08/31/2018 10:16   EKG Interpretation  Date/Time:  Sunday August 31 2018 09:25:44 EST Ventricular Rate:  70 PR Interval:    QRS Duration: 102 QT Interval:  450 QTC Calculation: 486 R  Axis:   36 Text Interpretation:  Sinus rhythm Anterior infarct, old Minimal ST depression, lateral leads Interpretation limited secondary to artifact Confirmed by Sherwood Gambler [phone]) on 08/31/2018 9:31:03 AM   Pending Labs Unresulted Labs (From admission, onward)   None      Vitals/Pain Today's Vitals   08/31/18 1030 08/31/18 1045 08/31/18 1100 08/31/18 1115  BP: (!) 189/76 (!) 196/86 (!) 146/129 (!) 165/72  Pulse: 66 67 65 (!) 57  Resp: (!) 22 (!) 23 (!) 22 (!) 21  Temp:      TempSrc:      SpO2: 96% 96% 96% 96%  PainSc:        Isolation Precautions No active isolations  Medications Medications  nitroGLYCERIN (NITROSTAT) SL tablet 0.4 mg (0.4 mg Sublingual Given 08/31/18 0956)  aspirin chewable tablet 324 mg (324 mg Oral Given 08/31/18 0937)  cloNIDine (CATAPRES) tablet 0.1 mg (0.1 mg Oral Given 08/31/18 0943)  diltiazem (CARDIZEM CD) 24 hr capsule 240 mg (240 mg Oral Given 08/31/18 1031)  isosorbide-hydrALAZINE (BIDIL) 20-37.5 MG per tablet 2 tablet (2 tablets Oral Given 08/31/18 1031)  labetalol (NORMODYNE) tablet 200 mg (200 mg Oral Given 08/31/18 1031)  furosemide (LASIX) injection 80 mg (80 mg Intravenous Given 08/31/18 1118)    Mobility walks

## 2018-08-31 NOTE — ED Provider Notes (Signed)
[facility] DEPT Provider Note   CSN: [id number] Arrival date & time: 08/31/18  0915     History   Chief Complaint Chief Complaint  Patient presents with  . Shortness of Breath    HPI Douglas Ballard is a 68 y.o. male.  HPI  68 year old male presents with dyspnea.  He states this started 2 days ago when he first woke up.  Has been about the same but severe.  It was at its worst on the first day but is still pretty significant.  Much worse when he exerts himself.  There is no cough.  He denies chest pain but states that it is hard to breathe which causes some chest discomfort.  No fevers or leg/abdominal swelling.  No recent travel.  He denies any back or abdominal pain.  No similar prior episodes.  Symptoms are worse when laying flat.  He is a former smoker but has quit 6 years ago.  Has been compliant with his blood pressure medicines but did not take them this morning.  Past Medical History:  Diagnosis Date  . Alcohol withdrawal seizure ([place]) 06/28/2011   Alcohol withdraw seizure prior to admission is suspected from history given by family & Post ictal appearance in the ED.   Marland Kitchen Anxiety   . Atrial fibrillation ([place])   . Cancer [facility]) 2012   Prostate surgery  . Chronic diastolic CHF (congestive heart failure) ([place]) 11/01/2014   Echo 8/15: Mild LVH, EF 50-55%, moderate BAE  //  b. Echo 7/17: EF 55-60%, normal wall motion, grade 2 diastolic dysfunction, MAC, mild MR, moderate LAE, trivial PI  . Compression fracture of thoracic vertebra ([place]) 06/26/2011  . Diabetes mellitus type 2 in nonobese (HCC)   . Fistula, bladder   . Frequent urination at night   . History of adenomatous polyp of colon 06/14/2014  . History of nuclear stress test    a. Myoview 10/15: Overall Impression: Low risk stress nuclear study demonstrating mild baseline ST-T changes with normal myocardial perfusion and low normal EF of 50%. // b.Myoview 7/17: EF 50%, no ischemia or scar, low  risk (EF normal by recent echo)  . Hypertension   . PNA (pneumonia) 06/26/2011  . Sepsis due to urinary tract infection ([place]) 04/16/2014  . Stroke ([place]) sept 1, 2015   tia x 3    Patient Active Problem List   Diagnosis Date Noted  . Acute exacerbation of CHF (congestive heart failure) ([place]) 08/31/2018  . Stress 08/01/2018  . Type 2 diabetes mellitus with stage 3 chronic kidney disease, without long-term current use of insulin ([place]) 09/06/2017  . Hematuria 02/08/2016  . CKD (chronic kidney disease) stage 3, GFR 30-59 ml/min (HCC) 02/07/2016  . Insomnia 09/19/2015  . Coarse tremors 09/19/2015  . Chronic diastolic CHF (congestive heart failure) ([place]) 11/01/2014  . History of adenomatous polyp of colon 06/14/2014  . Paroxysmal atrial fibrillation ([place]) 05/19/2014  . Elevated bilirubin 04/14/2014  . 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 Alcohol use disorder, moderate, dependence ([place]) 06/27/2011  . Hypertension associated with diabetes ([place]) 06/26/2011  . Personal history of prostate cancer s/p prostatectomy 2012 06/26/2011    Past Surgical History:  Procedure Laterality Date  . COLON SURGERY    . COLONOSCOPY N/A 06/14/2014   Procedure: COLONOSCOPY;  Surgeon: Gatha Mayer, MD;  Location: [facility];  Service: Endoscopy;  Laterality: N/A;  . CYSTOSCOPY WITH STENT PLACEMENT Bilateral 06/15/2014   Procedure: CYSTOSCOPY WITH BILATERAL STENT PLACEMENT;  Surgeon: Bernestine Amass, MD;  Location: [facility];  Service: Urology;  Laterality: Bilateral;  . LIPOMA EXCISION  2012   Dr Nonah Mattes  . moles removed     Back  . PROCTOSCOPY N/A 06/15/2014   Procedure: RIGID PROCTOSCOPY;  Surgeon: Michael Boston, MD;  Location: [facility];  Service: General;  Laterality: N/A;  . RADIOLOGY WITH ANESTHESIA N/A 07/28/2014   Procedure: Embolization;  Surgeon: Luanne Bras, MD;  Location: [facility];  Service: Radiology;  Laterality: N/A;  . ROBOT ASSISTED LAPAROSCOPIC RADICAL PROSTATECTOMY   01/31/2011   Robotic-assisted laparoscopic radical retropubic        Home Medications    Prior to Admission medications   Medication Sig Start Date End Date Taking? Authorizing Provider  cloNIDine (CATAPRES) 0.1 MG tablet Take 0.1 mg by mouth 2 (two) times daily.  06/26/17  Yes [provider]  diltiazem (CARDIZEM CD) 240 MG 24 hr capsule TAKE ONE CAPSULE BY MOUTH EVERY MORNING 01/17/18  Yes Vivi Barrack, MD  hydrOXYzine (ATARAX/VISTARIL) 50 MG tablet Take 0.5-2 tablets (25-100 mg total) by mouth at bedtime as needed for anxiety (insomnia). 08/01/18  Yes Vivi Barrack, MD  isosorbide-hydrALAZINE (BIDIL) 20-37.5 MG tablet Take 2 tablets by mouth 2 (two) times daily.    Yes [provider]  JANUVIA 100 MG tablet TAKE 1 TABLET BY MOUTH EVERY DAY 07/28/18  Yes Vivi Barrack, MD  labetalol (NORMODYNE) 200 MG tablet Take 2 tablets (400 mg total) by mouth 3 (three) times daily. Patient taking differently: Take 200 mg by mouth 2 (two) times daily.  02/22/17  Yes [place], Peter M, MD  XARELTO 15 MG TABS tablet TAKE 1 TABLET (15 MG TOTAL) BY MOUTH DAILY WITH SUPPER. 04/04/18  Yes [place], Peter M, MD  zolpidem (AMBIEN) 10 MG tablet TAKE 1 TABLET (10 MG TOTAL) BY MOUTH AT BEDTIME AS NEEDED. FOR SLEEP 08/01/18  Yes Vivi Barrack, MD  furosemide (LASIX) 20 MG tablet Take 1 tablet (20 mg total) by mouth every other day. Take 1 tablet by mouth Monday, Wednesday, and Friday. Patient not taking: Reported on 08/31/2018 06/03/17   Richardson Dopp T, PA-C  furosemide (LASIX) 20 MG tablet TAKE 1 TABLET BY MOUTH EVERY MONDAY, [place] Patient not taking: Reported on 08/31/2018 10/09/17   [place], Peter M, MD    Family History Family History  Problem Relation Age of Onset  . Atrial fibrillation Father        onset 30s. Had pacemaker placed for sinus pause  . Prostate cancer Father   . Breast cancer Mother   . Lung cancer Mother   . Leukemia Brother   . Colon polyps Brother   .  Colon cancer Neg Hx   . Diabetes Neg Hx     Social History Social History   Tobacco Use  . Smoking status: Former Smoker    Packs/day: 2.00    Years: 25.00    Pack years: 50.00    Types: Cigarettes    Last attempt to quit: 04/14/2014    Years since quitting: 4.3  . Smokeless tobacco: Never Used  Substance Use Topics  . Alcohol use: Yes    Alcohol/week: 7.0 - 10.0 standard drinks    Types: 7 - 10 Standard drinks or equivalent per week    Comment: 2 bottles of wine with wife a week; cocktail every other night  . Drug use: No     Allergies   Hydrochlorothiazide; Lasix [furosemide]; Nsaids; and Sulfa  antibiotics   Review of Systems Review of Systems  Constitutional: Negative for fever.  Respiratory: Positive for shortness of breath. Negative for cough.   Cardiovascular: Negative for chest pain and leg swelling.  Gastrointestinal: Negative for abdominal distention and abdominal pain.  Musculoskeletal: Negative for back pain.  All other systems reviewed and are negative.    Physical Exam Updated Vital Signs BP (!) 165/72   Pulse (!) 57   Temp 98.3 F (36.8 C) (Oral)   Resp (!) 21   SpO2 96%   Physical Exam Vitals signs and nursing note reviewed.  Constitutional:      Appearance: He is well-developed. He is not diaphoretic.  HENT:     Head: Normocephalic and atraumatic.     Right Ear: External ear normal.     Left Ear: External ear normal.     Nose: Nose normal.  Eyes:     General:        Right eye: No discharge.        Left eye: No discharge.  Neck:     Musculoskeletal: Neck supple.  Cardiovascular:     Rate and Rhythm: Normal rate and regular rhythm.     Heart sounds: Normal heart sounds.  Pulmonary:     Effort: Pulmonary effort is normal. Tachypnea present. No accessory muscle usage or respiratory distress.     Breath sounds: No decreased air movement.     Comments: Slight decreased breath sounds/mild expiratory wheezes at the bases. Abdominal:      Palpations: Abdomen is soft.     Tenderness: There is no abdominal tenderness.  Musculoskeletal:     Right lower leg: No edema.     Left lower leg: No edema.  Skin:    General: Skin is warm and dry.  Neurological:     Mental Status: He is alert.  Psychiatric:        Mood and Affect: Mood is not anxious.      ED Treatments / Results  Labs (all labs ordered are listed, but only abnormal results are displayed) Labs Reviewed  BASIC METABOLIC PANEL - Abnormal; Notable for the following components:      Result Value   Sodium 127 (*)    CO2 19 (*)    Glucose, Bld 153 (*)    BUN 33 (*)    Creatinine, Ser 2.42 (*)    Calcium 8.1 (*)    GFR calc non Af Amer 27 (*)    GFR calc Af Amer 31 (*)    All other components within normal limits  CBC WITH DIFFERENTIAL/PLATELET - Abnormal; Notable for the following components:   WBC 11.6 (*)    RBC 2.82 (*)    Hemoglobin 9.4 (*)    HCT 27.9 (*)    Neutro Abs 9.4 (*)    Lymphs Abs 0.4 (*)    Monocytes Absolute 1.6 (*)    All other components within normal limits  BRAIN NATRIURETIC PEPTIDE - Abnormal; Notable for the following components:   B Natriuretic Peptide 779.5 (*)    All other components within normal limits  I-STAT TROPONIN, ED    EKG EKG Interpretation  Date/Time:  Sunday August 31 2018 09:25:44 EST Ventricular Rate:  70 PR Interval:    QRS Duration: 102 QT Interval:  450 QTC Calculation: 486 R Axis:   36 Text Interpretation:  Sinus rhythm Anterior infarct, old Minimal ST depression, lateral leads Interpretation limited secondary to artifact Confirmed by Sherwood Gambler [phone]) on 08/31/2018 9:31:03  AM   Radiology Dg Chest 2 View  Result Date: 08/31/2018 CLINICAL DATA:  Shortness of breath EXAM: CHEST - 2 VIEW COMPARISON:  Chest radiograph 04/19/2017 FINDINGS: Monitoring leads overlie the patient. Stable cardiomegaly. Bilateral interstitial pulmonary opacities. Small bilateral pleural effusions. Thoracic spine  degenerative changes. IMPRESSION: Cardiomegaly, mild interstitial edema and small effusions. Electronically Signed   By: Lovey Newcomer M.D.   On: 08/31/2018 10:16    Procedures Procedures (including critical care time)  Medications Ordered in ED Medications  nitroGLYCERIN (NITROSTAT) SL tablet 0.4 mg (0.4 mg Sublingual Given 08/31/18 0956)  aspirin chewable tablet 324 mg (324 mg Oral Given 08/31/18 0937)  cloNIDine (CATAPRES) tablet 0.1 mg (0.1 mg Oral Given 08/31/18 0943)  diltiazem (CARDIZEM CD) 24 hr capsule 240 mg (240 mg Oral Given 08/31/18 1031)  isosorbide-hydrALAZINE (BIDIL) 20-37.5 MG per tablet 2 tablet (2 tablets Oral Given 08/31/18 1031)  labetalol (NORMODYNE) tablet 200 mg (200 mg Oral Given 08/31/18 1031)  furosemide (LASIX) injection 80 mg (80 mg Intravenous Given 08/31/18 1118)     Initial Impression / Assessment and Plan / ED Course  I have reviewed the triage vital signs and the nursing notes.  Pertinent labs & imaging results that were available during my care of the patient were reviewed by me and considered in my medical decision making (see chart for details).     Patient feels immediately better with some nasal cannula oxygen.  He had borderline low sats of about 90%.  Chest x-ray confirms pulmonary edema and chart review shows previous diastolic dysfunction.  He is not currently on Lasix due to previous problems with low sodium.  However at this point he will need IV diuresis.  Combination of his p.o. home meds and sublingual nitroglycerin has brought his blood pressure down and currently it is about 165/72.  His hemoglobin is a little lower than last check but he reports no obvious bleeding.  I think you will need continued blood pressure control and diuresis.  I doubt ACS or dissection.  Discussed with hospitalist who will admit.  Final Clinical Impressions(s) / ED Diagnoses   Final diagnoses:  Acute on chronic congestive heart failure, unspecified heart failure type  [facility])    ED Discharge Orders    None       Sherwood Gambler, MD 08/31/18 1122

## 2018-08-31 NOTE — Progress Notes (Signed)
  Echocardiogram 2D Echocardiogram has been performed.  Khamauri Bauernfeind G Zia Najera 08/31/2018, 3:26 PM

## 2018-08-31 NOTE — H&P (Signed)
History and Physical    Douglas Ballard PZW:[id number] DOB: June 27, 1951 DOA: 08/31/2018  PCP: Vivi Barrack, MD  Patient coming from: home  I have personally briefly reviewed patient's old medical records in [place]  Chief Complaint: sob  HPI: Douglas Ballard is a 68 y.o. male with medical history significant of diastolic heart failure with preserved ejection fraction by echocardiogram in 2017 as well as hypertension, CKD stage III, type 2 diabetes, paroxysmal atrial fibrillation on chronic anticoagulation and chronic anemia who presents to the ER with a 2 to 3-day history of worsening shortness of breath.  Patient reports Thursday Friday she became progressively more short of breath with difficulty walking upstairs, taking the garbage out without profound shortness of breath limiting his activity.  He denied acute chest pain nausea vomiting but did report as noted shortness of breath.  He reportedly had adjustments in his Lasix regimen recently with it being discontinued because " it is hurting my kidneys".  In the ER his hospital course is as follows  ED Course: Sodium was 127, BUN and creatinine of 33 and 2.4 which is his baseline, BNP of 780, hemoglobin of 9.4 down from baseline in the 12's with no reports of dark or bloody stools and a chest x-ray showing mild interstitial edema.  He received 80 mg IV x1 of Lasix as well as one-time doses of labetalol BiDil and diltiazem and clonidine secondary to elevated blood pressure upon arrival in the ER.  Current vitals heart rate 60 respirations 20 blood pressure 165/73 satting 98% with a temperature 98.3  Review of Systems: As per HPI otherwise 10 point review of systems negative.    Past Medical History:  Diagnosis Date  . 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   Marland Kitchen Anxiety   . Atrial fibrillation ([place])   .  Cancer [facility]) 2012   Prostate surgery  . Chronic diastolic CHF (congestive heart failure) ([place]) 11/01/2014   Echo 8/15: Mild LVH, EF 50-55%, moderate BAE  //  b. Echo 7/17: EF 55-60%, normal wall motion, grade 2 diastolic dysfunction, MAC, mild MR, moderate LAE, trivial PI  . Compression fracture of thoracic vertebra ([place]) 06/26/2011  . Diabetes mellitus type 2 in nonobese (HCC)   . Fistula, bladder   . Frequent urination at night   . History of adenomatous polyp of colon 06/14/2014  . History of nuclear stress test    a. Myoview 10/15: Overall Impression: Low risk stress nuclear study demonstrating mild baseline ST-T changes with normal myocardial perfusion and low normal EF of 50%. // b.Myoview 7/17: EF 50%, no ischemia or scar, low risk (EF normal by recent echo)  . Hypertension   . PNA (pneumonia) 06/26/2011  . Sepsis due to urinary tract infection ([place]) 04/16/2014  . Stroke ([place]) sept 1, 2015   tia x 3    Past Surgical History:  Procedure Laterality Date  . COLON SURGERY    . COLONOSCOPY N/A 06/14/2014   Procedure: COLONOSCOPY;  Surgeon: Gatha Mayer, MD;  Location: [facility];  Service: Endoscopy;  Laterality: N/A;  . CYSTOSCOPY WITH STENT PLACEMENT Bilateral 06/15/2014   Procedure: CYSTOSCOPY WITH BILATERAL STENT PLACEMENT;  Surgeon: Bernestine Amass, MD;  Location: [facility];  Service: Urology;  Laterality: Bilateral;  . LIPOMA EXCISION  2012   Dr Nonah Mattes  . moles removed     Back  .  PROCTOSCOPY N/A 06/15/2014   Procedure: RIGID PROCTOSCOPY;  Surgeon: Michael Boston, MD;  Location: [facility];  Service: General;  Laterality: N/A;  . RADIOLOGY WITH ANESTHESIA N/A 07/28/2014   Procedure: Embolization;  Surgeon: Luanne Bras, MD;  Location: [facility];  Service: Radiology;  Laterality: N/A;  . ROBOT ASSISTED LAPAROSCOPIC RADICAL PROSTATECTOMY  01/31/2011   Robotic-assisted laparoscopic radical retropubic     reports that he quit smoking about 4 years ago. His smoking use included  cigarettes. He has a 50.00 pack-year smoking history. He has never used smokeless tobacco. He reports current alcohol use of about 7.0 - 10.0 standard drinks of alcohol per week. He reports that he does not use drugs.  Allergies  Allergen Reactions  . Hydrochlorothiazide Other (See Comments)    Pt gets hyponatremia  . Lasix [Furosemide] Other (See Comments)    Sodium levels drop when take  . Nsaids Other (See Comments)    Kidney disease/failure  . Sulfa Antibiotics Other (See Comments)    headaches    Family History  Problem Relation Age of Onset  . Atrial fibrillation Father        onset 53s. Had pacemaker placed for sinus pause  . Prostate cancer Father   . Breast cancer Mother   . Lung cancer Mother   . Leukemia Brother   . Colon polyps Brother   . Colon cancer Neg Hx   . Diabetes Neg Hx      Prior to Admission medications   Medication Sig Start Date End Date Taking? Authorizing Provider  cloNIDine (CATAPRES) 0.1 MG tablet Take 0.1 mg by mouth 2 (two) times daily.  06/26/17  Yes [provider]  diltiazem (CARDIZEM CD) 240 MG 24 hr capsule TAKE ONE CAPSULE BY MOUTH EVERY MORNING 01/17/18  Yes Vivi Barrack, MD  hydrOXYzine (ATARAX/VISTARIL) 50 MG tablet Take 0.5-2 tablets (25-100 mg total) by mouth at bedtime as needed for anxiety (insomnia). 08/01/18  Yes Vivi Barrack, MD  isosorbide-hydrALAZINE (BIDIL) 20-37.5 MG tablet Take 2 tablets by mouth 2 (two) times daily.    Yes [provider]  JANUVIA 100 MG tablet TAKE 1 TABLET BY MOUTH EVERY DAY 07/28/18  Yes Vivi Barrack, MD  labetalol (NORMODYNE) 200 MG tablet Take 2 tablets (400 mg total) by mouth 3 (three) times daily. Patient taking differently: Take 200 mg by mouth 2 (two) times daily.  02/22/17  Yes Martinique, Peter M, MD  XARELTO 15 MG TABS tablet TAKE 1 TABLET (15 MG TOTAL) BY MOUTH DAILY WITH SUPPER. 04/04/18  Yes [place], Peter M, MD  zolpidem (AMBIEN) 10 MG tablet TAKE 1 TABLET (10 MG TOTAL) BY MOUTH  AT BEDTIME AS NEEDED. FOR SLEEP 08/01/18  Yes Vivi Barrack, MD  furosemide (LASIX) 20 MG tablet Take 1 tablet (20 mg total) by mouth every other day. Take 1 tablet by mouth Monday, Wednesday, and Friday. Patient not taking: Reported on 08/31/2018 06/03/17   Richardson Dopp T, PA-C  furosemide (LASIX) 20 MG tablet TAKE 1 TABLET BY MOUTH EVERY MONDAY, [place] Patient not taking: Reported on 08/31/2018 10/09/17   [place], Peter M, MD    Physical Exam: Vitals:   08/31/18 1045 08/31/18 1100 08/31/18 1115 08/31/18 1130  BP: (!) 196/86 (!) 146/129 (!) 165/72 (!) 165/73  Pulse: 67 65 (!) 57 60  Resp: (!) 23 (!) 22 (!) 21 20  Temp:      TempSrc:      SpO2: 96% 96% 96% 98%  Constitutional: NAD, calm, comfortable Vitals:   08/31/18 1045 08/31/18 1100 08/31/18 1115 08/31/18 1130  BP: (!) 196/86 (!) 146/129 (!) 165/72 (!) 165/73  Pulse: 67 65 (!) 57 60  Resp: (!) 23 (!) 22 (!) 21 20  Temp:      TempSrc:      SpO2: 96% 96% 96% 98%   Eyes: PERRL, lids and conjunctivae normal ENMT: Mucous membranes are moist. Posterior pharynx clear of any exudate or lesions.Normal dentition.  Neck: normal, supple, no masses, no thyromegaly Respiratory: Mild rales bilaterally no accessory muscle use normal respiratory rate, speaking in full sentences Cardiovascular: Regular rate and rhythm, no murmurs / rubs / gallops. No extremity edema. 2+ pedal pulses. No carotid bruits.  Abdomen: no tenderness, no masses palpated. No hepatosplenomegaly. Bowel sounds positive.  Musculoskeletal: no clubbing / cyanosis. No joint deformity upper and lower extremities. Good ROM, no contractures. Normal muscle tone.  Skin: no rashes, lesions, ulcers. No induration Neurologic: CN 2-12 grossly intact. Sensation intact, DTR normal. Strength 5/5 in all 4.  Psychiatric: Normal judgment and insight. Alert and oriented x 3. Normal mood.     Labs on Admission: I have personally reviewed following labs and imaging  studies  CBC: Recent Labs  Lab 08/31/18 0936  WBC 11.6*  NEUTROABS 9.4*  HGB 9.4*  HCT 27.9*  MCV 98.9  PLT 194   Basic Metabolic Panel: Recent Labs  Lab 08/31/18 0936  NA 127*  K 4.1  CL 98  CO2 19*  GLUCOSE 153*  BUN 33*  CREATININE 2.42*  CALCIUM 8.1*   GFR: CrCl cannot be calculated (Unknown ideal weight.). Liver Function Tests: No results for input(s): AST, ALT, ALKPHOS, BILITOT, PROT, ALBUMIN in the last 168 hours. No results for input(s): LIPASE, AMYLASE in the last 168 hours. No results for input(s): AMMONIA in the last 168 hours. Coagulation Profile: No results for input(s): INR, PROTIME in the last 168 hours. Cardiac Enzymes: No results for input(s): CKTOTAL, CKMB, CKMBINDEX, TROPONINI in the last 168 hours. BNP (last 3 results) No results for input(s): PROBNP in the last 8760 hours. HbA1C: No results for input(s): HGBA1C in the last 72 hours. CBG: No results for input(s): GLUCAP in the last 168 hours. Lipid Profile: No results for input(s): CHOL, HDL, LDLCALC, TRIG, CHOLHDL, LDLDIRECT in the last 72 hours. Thyroid Function Tests: No results for input(s): TSH, T4TOTAL, FREET4, T3FREE, THYROIDAB in the last 72 hours. Anemia Panel: No results for input(s): VITAMINB12, FOLATE, FERRITIN, TIBC, IRON, RETICCTPCT in the last 72 hours. Urine analysis:    Component Value Date/Time   COLORURINE YELLOW 02/06/2016 0345   APPEARANCEUR CLEAR 02/06/2016 0345   LABSPEC 1.014 02/06/2016 0345   PHURINE 6.0 02/06/2016 0345   GLUCOSEU NEGATIVE 02/06/2016 0345   HGBUR MODERATE (A) 02/06/2016 0345   BILIRUBINUR NEGATIVE 02/06/2016 0345   KETONESUR NEGATIVE 02/06/2016 0345   PROTEINUR 100 (A) 02/06/2016 0345   UROBILINOGEN 1.0 04/14/2014 0759   NITRITE NEGATIVE 02/06/2016 0345   LEUKOCYTESUR NEGATIVE 02/06/2016 0345    Radiological Exams on Admission: Dg Chest 2 View  Result Date: 08/31/2018 CLINICAL DATA:  Shortness of breath EXAM: CHEST - 2 VIEW COMPARISON:   Chest radiograph 04/19/2017 FINDINGS: Monitoring leads overlie the patient. Stable cardiomegaly. Bilateral interstitial pulmonary opacities. Small bilateral pleural effusions. Thoracic spine degenerative changes. IMPRESSION: Cardiomegaly, mild interstitial edema and small effusions. Electronically Signed   By: Lovey Newcomer M.D.   On: 08/31/2018 10:16    EKG: Independently reviewed.  Sinus rhythm with no  acute ST changes  Assessment/Plan Principal Problem:   Acute diastolic (congestive) heart failure (HCC) Active Problems:   Hypertension associated with diabetes (HCC)   Paroxysmal atrial fibrillation (HCC)   CKD (chronic kidney disease) stage 3, GFR 30-59 ml/min (HCC)   Type 2 diabetes mellitus with stage 3 chronic kidney disease, without long-term current use of insulin (HCC)   Acute exacerbation of CHF (congestive heart failure) ([place])   Anemia due to chronic kidney disease  (please populate well all problems here in Problem List. (For example, if patient is on BP meds at home and you resume or decide to hold them, it is a problem that needs to be her. Same for CAD, COPD, HLD and so on)   Acute diastolic heart failure.  Patient is on a nitrate, continue with diuresis with Lasix 40 IV twice daily.  Strict I's and O's Daily weights, can repeat an echocardiogram to evaluate systolic function.  unControlled hypertension associated with diabetes.  Patient appeared to be somewhat anxious upon arrival because he was short of breath.  His blood pressure has improved with treatment.  We will continue his home medications without any titration at this point in time.  Monitor while inpatient adjust appropriately  Paroxysmal A. fib patient currently is in his normal sinus rhythm.  We will continue his anticoagulation, continue his nodal blocking agents, no changes  CKD stage III.  Patient is at his baseline will need to monitor closely in the setting of diuresis, patient did report that his Lasix was  recently stopped with plans to reinitiate at every 48 hours scheduling because of "it was hurting my kidneys"  Type 2 diabetes.  Blood patient on glycemic control.  Check blood glucose AC at bedtime, diabetic diet  Anemia due to chronic kidney disease.  Patient with chronic anemia I am a check iron panel, review of records indicate hemoglobin typically is in the 12's.  Patient denied any bleeding will check a guaiac  DVT prophylaxis: xarelto Code Status: full (Full/Partial (specify details) Family Communication: pt and daughter are in agreement with plan of care Disposition Plan: To home in 1 to 2 days Consults called: None  Admission status: Observation telemetry for acute CHF exacerbation Nicolette Bang MD [facility]   If 7PM-7AM, please contact night-coverage [email or web address] Password [facility]  08/31/2018, 11:34 AM

## 2018-08-31 NOTE — ED Notes (Signed)
Waiting for meds from pharmacy. 

## 2018-08-31 NOTE — ED Notes (Signed)
Patient transported to X-ray 

## 2018-09-01 DIAGNOSIS — I130 Hypertensive heart and chronic kidney disease with heart failure and stage 1 through stage 4 chronic kidney disease, or unspecified chronic kidney disease: Secondary | ICD-10-CM | POA: Diagnosis not present

## 2018-09-01 DIAGNOSIS — I5031 Acute diastolic (congestive) heart failure: Secondary | ICD-10-CM | POA: Diagnosis not present

## 2018-09-01 DIAGNOSIS — I5033 Acute on chronic diastolic (congestive) heart failure: Secondary | ICD-10-CM

## 2018-09-01 DIAGNOSIS — E1159 Type 2 diabetes mellitus with other circulatory complications: Secondary | ICD-10-CM | POA: Diagnosis not present

## 2018-09-01 DIAGNOSIS — N183 Chronic kidney disease, stage 3 (moderate): Secondary | ICD-10-CM | POA: Diagnosis not present

## 2018-09-01 LAB — IRON AND TIBC
Iron: 24 ug/dL — ABNORMAL LOW (ref 45–182)
Saturation Ratios: 11 % — ABNORMAL LOW (ref 17.9–39.5)
TIBC: 220 ug/dL — ABNORMAL LOW (ref 250–450)
UIBC: 196 ug/dL

## 2018-09-01 LAB — BASIC METABOLIC PANEL
Anion gap: 15 (ref 5–15)
BUN: 33 mg/dL — ABNORMAL HIGH (ref 8–23)
CO2: 18 mmol/L — ABNORMAL LOW (ref 22–32)
Calcium: 8 mg/dL — ABNORMAL LOW (ref 8.9–10.3)
Chloride: 98 mmol/L (ref 98–111)
Creatinine, Ser: 2.39 mg/dL — ABNORMAL HIGH (ref 0.61–1.24)
GFR calc Af Amer: 31 mL/min — ABNORMAL LOW (ref >60)
GFR calc non Af Amer: 27 mL/min — ABNORMAL LOW (ref >60)
Glucose, Bld: 162 mg/dL — ABNORMAL HIGH (ref 70–99)
POTASSIUM: 3.5 mmol/L (ref 3.5–5.1)
Sodium: 131 mmol/L — ABNORMAL LOW (ref 135–145)

## 2018-09-01 LAB — GLUCOSE, CAPILLARY
Glucose-Capillary: 128 mg/dL — ABNORMAL HIGH (ref 70–99)
Glucose-Capillary: 250 mg/dL — ABNORMAL HIGH (ref 70–99)
Glucose-Capillary: 173 mg/dL — ABNORMAL HIGH (ref 70–99)
Glucose-Capillary: 217 mg/dL — ABNORMAL HIGH (ref 70–99)

## 2018-09-01 LAB — FERRITIN: Ferritin: 214 ng/mL (ref 24–336)

## 2018-09-01 MED ORDER — FUROSEMIDE 40 MG PO TABS
40.0000 mg | ORAL_TABLET | Freq: Every day | ORAL | Status: DC
  Administered 2018-09-02: 40 mg via ORAL
  Filled 2018-09-01: qty 1

## 2018-09-01 MED ORDER — CLONIDINE HCL 0.2 MG PO TABS
0.2000 mg | ORAL_TABLET | Freq: Two times a day (BID) | ORAL | Status: DC
  Administered 2018-09-01 – 2018-09-02 (×3): 0.2 mg via ORAL
  Filled 2018-09-01 (×3): qty 1

## 2018-09-01 NOTE — Progress Notes (Signed)
PROGRESS NOTE    Douglas Ballard  DGU:[id number] DOB: 1950/12/05 DOA: 08/31/2018 PCP: Vivi Barrack, MD    Brief Narrative:  68 year old male who presented with dyspnea.  He does have significant past medical history for diastolic heart failure, chronic kidney disease stage III, hypertension, type 2 diabetes mellitus, and paroxysmal atrial fibrillation.  3 days of worsening dyspnea, progressive to the point where he was dyspneic with minimal efforts, no chest pain.  His furosemide was recently discontinued due to renal injury.  On his initial physical examination blood pressure 196/86, heart rate 67, respiratory rate 23, oxygen saturation 96%.  He had rales bilaterally, heart S1-S2 present, rhythmic, abdomen soft nontender, no significant lower extremity edema.  Sodium 127, potassium 4.1, chloride 98, bicarb 19, glucose 153, BUN 33, creatinine 2.42, BNP 779.5, white count 11.6, hemoglobin 9.4, hematocrit 27.9, platelets 237, chest x-ray with bilateral interstitial infiltrates, vascular congestion and small bilateral effusions.  EKG sinus rhythm, normal axis, septal Q waves, chronic changes.  Patient was admitted to the hospital with a working diagnosis of acute decompensated diastolic heart failure, complicated by acute cardiogenic pulmonary edema.    Assessment & Plan:   Principal Problem:   Acute diastolic (congestive) heart failure (HCC) Active Problems:   Hypertension associated with diabetes (HCC)   Paroxysmal atrial fibrillation (HCC)   CKD (chronic kidney disease) stage 3, GFR 30-59 ml/min (HCC)   Type 2 diabetes mellitus with stage 3 chronic kidney disease, without long-term current use of insulin (HCC)   Acute exacerbation of CHF (congestive heart failure) (HCC)   Anemia due to chronic kidney disease  1. Acute decompensated diastolic heart failure  complicated with acute cardiogenic pulmonary edema. Patient responding well to diuresis, but not back to baseline, will continue with  furosemide therapy. Urine output since admission 1575 ml. Will continue heart failure management with labetalol, and after load reduction with hydralazine and isosorbide. Follow up on echocardiogram.   2. HTN. Will continue diltiazem, blood pressure continue to be uncontrolled, will resume clonidine 0.2 mg po bid per home regimen.   3. Paroxysmal atrial fibrillation. Patient has remained on sinus rhythm, will continue labetalol for rate control and anticoagulation with rivaroxaban.   4. CKD stage 3. Stable renal function with a serum cr at 2,39 with K at 3,5, and serum bicarbonate 18. Will continue diuresis with furosemide and will follow on renal panel in am.   5. T2Dm. Fasting glucose this am at 169, will continue insulin sliding scale for glucose cover and monitoring. Patient tolerating po well.   6. Chronic anemia. Stable with no indication for transfusion.   DVT prophylaxis: heparin   Code Status: full Family Communication: I spoke with patient's family at the bedside and all questions were addressed.  Disposition Plan/ discharge barriers: Possible discharge in 24 hours, pending echocardiogram.   Body mass index is 27.1 kg/m. Malnutrition Type:      Malnutrition Characteristics:      Nutrition Interventions:     RN Pressure Injury Documentation:     Consultants:     Procedures:     Antimicrobials:       Subjective: Dyspnea has improved but not back to baseline, no chest pain or palpitations, no nausea or vomiting.   Objective: Vitals:   08/31/18 1602 08/31/18 2127 09/01/18 0500 09/01/18 0919  BP: (!) 179/70 (!) 188/100 (!) 175/90   Pulse: (!) 59 75 66 64  Resp: 18 18 18    Temp: 98.9 F (37.2 C) 98.6 F (37 C)  98.7 F (37.1 C)   TempSrc: Oral     SpO2: 92% 97% 95%   Weight:      Height:        Intake/Output Summary (Last 24 hours) at 09/01/2018 1139 Last data filed at 09/01/2018 0500 Gross per 24 hour  Intake -  Output 1575 ml  Net -1575 ml    Filed Weights   08/31/18 1232  Weight: 83.2 kg    Examination:   General: Not in pain.  Neurology: Awake and alert, non focal  E ENT: no pallor, no icterus, oral mucosa moist Cardiovascular: No JVD. S1-S2 present, rhythmic, no gallops, rubs, or murmurs. Trace lower extremity edema. Pulmonary: decreased breath sounds bilaterally at bases, positive air movement, no wheezing, rhonchi or rales. Gastrointestinal. Abdomen with no organomegaly, non tender, no rebound or guarding Skin. No rashes Musculoskeletal: no joint deformities     Data Reviewed: I have personally reviewed following labs and imaging studies  CBC: Recent Labs  Lab 08/31/18 0936  WBC 11.6*  NEUTROABS 9.4*  HGB 9.4*  HCT 27.9*  MCV 98.9  PLT 412   Basic Metabolic Panel: Recent Labs  Lab 08/31/18 0936 09/01/18 0515  NA 127* 131*  K 4.1 3.5  CL 98 98  CO2 19* 18*  GLUCOSE 153* 162*  BUN 33* 33*  CREATININE 2.42* 2.39*  CALCIUM 8.1* 8.0*   GFR: Estimated Creatinine Clearance: 30 mL/min (A) (by C-G formula based on SCr of 2.39 mg/dL (H)). Liver Function Tests: No results for input(s): AST, ALT, ALKPHOS, BILITOT, PROT, ALBUMIN in the last 168 hours. No results for input(s): LIPASE, AMYLASE in the last 168 hours. No results for input(s): AMMONIA in the last 168 hours. Coagulation Profile: No results for input(s): INR, PROTIME in the last 168 hours. Cardiac Enzymes: No results for input(s): CKTOTAL, CKMB, CKMBINDEX, TROPONINI in the last 168 hours. BNP (last 3 results) No results for input(s): PROBNP in the last 8760 hours. HbA1C: No results for input(s): HGBA1C in the last 72 hours. CBG: Recent Labs  Lab 08/31/18 1643 08/31/18 2124  GLUCAP 168* 136*   Lipid Profile: No results for input(s): CHOL, HDL, LDLCALC, TRIG, CHOLHDL, LDLDIRECT in the last 72 hours. Thyroid Function Tests: No results for input(s): TSH, T4TOTAL, FREET4, T3FREE, THYROIDAB in the last 72 hours. Anemia Panel: Recent  Labs    09/01/18 0515  FERRITIN 214  TIBC 220*  IRON 24*      Radiology Studies: I have reviewed all of the imaging during this hospital visit personally     Scheduled Meds: . diltiazem  240 mg Oral Daily  . furosemide  40 mg Intravenous BID  . insulin aspart  0-5 Units Subcutaneous QHS  . insulin aspart  0-9 Units Subcutaneous TID WC  . isosorbide-hydrALAZINE  2 tablet Oral TID  . labetalol  200 mg Oral BID  . Rivaroxaban  15 mg Oral q1800  . sodium chloride flush  3 mL Intravenous Q12H   Continuous Infusions: . sodium chloride       LOS: 0 days         Gerome Apley, MD [facility] Pager [phone]

## 2018-09-02 DIAGNOSIS — I130 Hypertensive heart and chronic kidney disease with heart failure and stage 1 through stage 4 chronic kidney disease, or unspecified chronic kidney disease: Secondary | ICD-10-CM | POA: Diagnosis not present

## 2018-09-02 DIAGNOSIS — E1159 Type 2 diabetes mellitus with other circulatory complications: Secondary | ICD-10-CM | POA: Diagnosis not present

## 2018-09-02 DIAGNOSIS — I5033 Acute on chronic diastolic (congestive) heart failure: Secondary | ICD-10-CM | POA: Diagnosis not present

## 2018-09-02 DIAGNOSIS — I480 Paroxysmal atrial fibrillation: Secondary | ICD-10-CM | POA: Diagnosis not present

## 2018-09-02 DIAGNOSIS — I5031 Acute diastolic (congestive) heart failure: Secondary | ICD-10-CM | POA: Diagnosis not present

## 2018-09-02 LAB — BASIC METABOLIC PANEL
Anion gap: 13 (ref 5–15)
BUN: 30 mg/dL — ABNORMAL HIGH (ref 8–23)
CO2: 20 mmol/L — AB (ref 22–32)
Calcium: 7.9 mg/dL — ABNORMAL LOW (ref 8.9–10.3)
Chloride: 95 mmol/L — ABNORMAL LOW (ref 98–111)
Creatinine, Ser: 2.15 mg/dL — ABNORMAL HIGH (ref 0.61–1.24)
GFR calc non Af Amer: 31 mL/min — ABNORMAL LOW (ref >60)
GFR, EST AFRICAN AMERICAN: 36 mL/min — AB (ref >60)
Glucose, Bld: 141 mg/dL — ABNORMAL HIGH (ref 70–99)
Potassium: 3.4 mmol/L — ABNORMAL LOW (ref 3.5–5.1)
Sodium: 128 mmol/L — ABNORMAL LOW (ref 135–145)

## 2018-09-02 LAB — HIV ANTIBODY (ROUTINE TESTING W REFLEX): HIV Screen 4th Generation wRfx: NONREACTIVE

## 2018-09-02 LAB — GLUCOSE, CAPILLARY
GLUCOSE-CAPILLARY: 144 mg/dL — AB (ref 70–99)
Glucose-Capillary: 168 mg/dL — ABNORMAL HIGH (ref 70–99)

## 2018-09-02 MED ORDER — FUROSEMIDE 40 MG PO TABS: 40.0000 mg | ORAL_TABLET | Freq: Every day | ORAL | 0 refills | Status: DC

## 2018-09-02 MED ORDER — LIVING BETTER WITH HEART FAILURE BOOK: Freq: Once | Status: DC

## 2018-09-02 MED ORDER — FERROUS SULFATE 325 (65 FE) MG PO TABS: 325.0000 mg | ORAL_TABLET | Freq: Every day | ORAL | 0 refills | Status: DC

## 2018-09-02 MED ORDER — LIVING BETTER WITH HEART FAILURE BOOK: 1.0000 | Freq: Once | 0 refills | Status: AC

## 2018-09-02 NOTE — Discharge Summary (Signed)
Physician Discharge Summary  Douglas Ballard EVO:[id number] DOB: Apr 14, 1951 DOA: 08/31/2018  PCP: Vivi Barrack, MD  Admit date: 08/31/2018 Discharge date: 09/02/2018  Admitted From: Home  Disposition:  Home   Recommendations for Outpatient Follow-up and new medication changes:  1. Follow up with Dr. [name] in 7 days.  2. Patient has been placed on daily furosemide to keep negative fluid balance. 3. Follow on renal panel as outpatient.   Home Health: no   Equipment/Devices: no    Discharge Condition: stable  CODE STATUS: full  Diet recommendation: heart healthy and diabetic prudent.   Brief/Interim Summary: 68 year old male who presented with dyspnea.  He does have significant past medical history for diastolic heart failure, chronic kidney disease stage III, hypertension, type 2 diabetes mellitus, and paroxysmal atrial fibrillation. Reported 3 days of worsening dyspnea, progressive to the point where he was dyspneic with minimal efforts, no chest pain.  His furosemide was recently discontinued due to renal injury.  On his initial physical examination blood pressure 196/86, heart rate 67, respiratory rate 23, oxygen saturation 96%. On lung auscultation had rales bilaterally, heart S1-S2 present, rhythmic, abdomen soft nontender, no significant lower extremity edema.  Sodium 127, potassium 4.1, chloride 98, bicarb 19, glucose 153, BUN 33, creatinine 2.42, BNP 779.5, white count 11.6, hemoglobin 9.4, hematocrit 27.9, platelets 237, chest x-ray with bilateral interstitial infiltrates, vascular congestion and small bilateral effusions.  EKG sinus rhythm, normal axis, septal Q waves, chronic changes.  Patient was admitted to the hospital with a working diagnosis of acute decompensated diastolic heart failure, complicated by acute cardiogenic pulmonary edema.   1.  Acute decompensated chronic diastolic heart failure, complicated by acute cardiogenic pulmonary edema.  Patient was admitted to the  medical ward, he was placed on a remote telemetry monitor, received aggressive diuresis with IV furosemide, negative fluid balance was achieved, with significant improvement of his symptoms.  Further work-up with echocardiography showed left ventricle ejection fraction 55 to 60%.  Patient will be discharged on daily furosemide 40 mg daily.  Continue beta-blockade with labetalol and afterload reduction with BiDil (isosorbide/hydrochlorothiazide).  Patient was advised about salt and fluid restriction, along with moderation at the time of alcohol consumption.   2.  Hypertension.  Continue blood pressure control with clonidine, labetalol, diltiazem, hydrochlorothiazide and isosorbide.  Discharge systolic blood pressure 299 to 180 mmHg.  3.  Paroxysmal atrial fibrillation.  Patient remained sinus rhythm, continue labetalol for rate control and anticoagulation with rivaroxaban.  4.  Stage III chronic kidney disease, with hyponatremia and hypokalemia.  Patient tolerated well diuresis with IV furosemide, his discharge creatinine is 2.15, potassium 3.4 and sodium 128.  Will advise patient to continue furosemide 40 mg daily and follow-up as an outpatient kidney function and electrolytes.  5.  Type 2 diabetes mellitus.  Patient was placed on insulin sliding scale for glucose coverage monitoring, capillary glucose remained well controlled. At discharge continue Januvia.   6.  Chronic anemia of iron deficiency.  Iron stores showed a transferrin saturation of 11, serum iron 24, TIBC 220 and ferritin of 214.  Patient was placed on iron supplements, to take daily.     Discharge Diagnoses:  Principal Problem:   Acute diastolic (congestive) heart failure (HCC) Active Problems:   Hypertension associated with diabetes (HCC)   Paroxysmal atrial fibrillation (HCC)   CKD (chronic kidney disease) stage 3, GFR 30-59 ml/min (HCC)   Type 2 diabetes mellitus with stage 3 chronic kidney disease, without long-term current  use of insulin (  [place])   Acute exacerbation of CHF (congestive heart failure) ([place])   Anemia due to chronic kidney disease    Discharge Instructions   Allergies as of 09/02/2018      Reactions   Hydrochlorothiazide Other (See Comments)   Pt gets hyponatremia   Lasix [furosemide] Other (See Comments)   Sodium levels drop when take   Nsaids Other (See Comments)   Kidney disease/failure   Sulfa Antibiotics Other (See Comments)   headaches      Medication List    TAKE these medications   cloNIDine 0.1 MG tablet Commonly known as:  CATAPRES Take 0.1 mg by mouth 2 (two) times daily.   diltiazem 240 MG 24 hr capsule Commonly known as:  CARDIZEM CD TAKE ONE CAPSULE BY MOUTH EVERY MORNING   ferrous sulfate 325 (65 FE) MG tablet Take 1 tablet (325 mg total) by mouth daily.   furosemide 40 MG tablet Commonly known as:  LASIX Take 1 tablet (40 mg total) by mouth daily for 30 days. Start taking on:  September 03, 2018 What changed:    medication strength  how much to take  when to take this  additional instructions  Another medication with the same name was removed. Continue taking this medication, and follow the directions you see here.   hydrOXYzine 50 MG tablet Commonly known as:  ATARAX/VISTARIL Take 0.5-2 tablets (25-100 mg total) by mouth at bedtime as needed for anxiety (insomnia).   isosorbide-hydrALAZINE 20-37.5 MG tablet Commonly known as:  BIDIL Take 2 tablets by mouth 2 (two) times daily.   JANUVIA 100 MG tablet Generic drug:  sitaGLIPtin TAKE 1 TABLET BY MOUTH EVERY DAY   labetalol 200 MG tablet Commonly known as:  NORMODYNE Take 2 tablets (400 mg total) by mouth 3 (three) times daily. What changed:    how much to take  when to take this   Living Better with Heart Failure Book Misc 1 each by Does not apply route once for 1 dose.   XARELTO 15 MG Tabs tablet Generic drug:  Rivaroxaban TAKE 1 TABLET (15 MG TOTAL) BY MOUTH DAILY WITH SUPPER.    zolpidem 10 MG tablet Commonly known as:  AMBIEN TAKE 1 TABLET (10 MG TOTAL) BY MOUTH AT BEDTIME AS NEEDED. FOR SLEEP       Allergies  Allergen Reactions  . Hydrochlorothiazide Other (See Comments)    Pt gets hyponatremia  . Lasix [Furosemide] Other (See Comments)    Sodium levels drop when take  . Nsaids Other (See Comments)    Kidney disease/failure  . Sulfa Antibiotics Other (See Comments)    headaches    Consultations:     Procedures/Studies: Dg Chest 2 View  Result Date: 08/31/2018 CLINICAL DATA:  Shortness of breath EXAM: CHEST - 2 VIEW COMPARISON:  Chest radiograph 04/19/2017 FINDINGS: Monitoring leads overlie the patient. Stable cardiomegaly. Bilateral interstitial pulmonary opacities. Small bilateral pleural effusions. Thoracic spine degenerative changes. IMPRESSION: Cardiomegaly, mild interstitial edema and small effusions. Electronically Signed   By: Lovey Newcomer M.D.   On: 08/31/2018 10:16       Subjective: Patient is feeling well, back to his baseline, no dyspnea, no chest pain, no nausea or vomiting.   Discharge Exam: Vitals:   09/02/18 0450 09/02/18 0851  BP: (!) 176/92 (!) 183/84  Pulse: 74 72  Resp: 20   Temp: 98.8 F (37.1 C)   SpO2: 96%    Vitals:   09/01/18 2110 09/02/18 0300 09/02/18 0450 09/02/18 0851  BP: Marland Kitchen)  157/97  (!) 176/92 (!) 183/84  Pulse: 85  74 72  Resp: 18  20   Temp: 98.8 F (37.1 C)  98.8 F (37.1 C)   TempSrc: Oral  Oral   SpO2: 97%  96%   Weight:  78.8 kg    Height:        General: Not in pain or dyspnea Neurology: Awake and alert, non focal  E ENT: no pallor, no icterus, oral mucosa moist Cardiovascular: No JVD. S1-S2 present, rhythmic, no gallops, rubs, or murmurs. No lower extremity edema. Pulmonary: vesicular breath sounds bilaterally, adequate air movement, no wheezing, rhonchi or rales. Gastrointestinal. Abdomen with no organomegaly, non tender, no rebound or guarding Skin. No rashes Musculoskeletal: no  joint deformities   The results of significant diagnostics from this hospitalization (including imaging, microbiology, ancillary and laboratory) are listed below for reference.     Microbiology: No results found for this or any previous visit (from the past 240 hour(s)).   Labs: BNP (last 3 results) Recent Labs    08/31/18 0936  BNP 174.9*   Basic Metabolic Panel: Recent Labs  Lab 08/31/18 0936 09/01/18 0515 09/02/18 0324  NA 127* 131* 128*  K 4.1 3.5 3.4*  CL 98 98 95*  CO2 19* 18* 20*  GLUCOSE 153* 162* 141*  BUN 33* 33* 30*  CREATININE 2.42* 2.39* 2.15*  CALCIUM 8.1* 8.0* 7.9*   Liver Function Tests: No results for input(s): AST, ALT, ALKPHOS, BILITOT, PROT, ALBUMIN in the last 168 hours. No results for input(s): LIPASE, AMYLASE in the last 168 hours. No results for input(s): AMMONIA in the last 168 hours. CBC: Recent Labs  Lab 08/31/18 0936  WBC 11.6*  NEUTROABS 9.4*  HGB 9.4*  HCT 27.9*  MCV 98.9  PLT 237   Cardiac Enzymes: No results for input(s): CKTOTAL, CKMB, CKMBINDEX, TROPONINI in the last 168 hours. BNP: Invalid input(s): POCBNP CBG: Recent Labs  Lab 09/01/18 0726 09/01/18 1155 09/01/18 1624 09/01/18 2110 09/02/18 0813  GLUCAP 128* 250* 173* 217* 168*   D-Dimer No results for input(s): DDIMER in the last 72 hours. Hgb A1c No results for input(s): HGBA1C in the last 72 hours. Lipid Profile No results for input(s): CHOL, HDL, LDLCALC, TRIG, CHOLHDL, LDLDIRECT in the last 72 hours. Thyroid function studies No results for input(s): TSH, T4TOTAL, T3FREE, THYROIDAB in the last 72 hours.  Invalid input(s): FREET3 Anemia work up Recent Labs    09/01/18 0515  FERRITIN 214  TIBC 220*  IRON 24*   Urinalysis    Component Value Date/Time   COLORURINE YELLOW 02/06/2016 0345   APPEARANCEUR CLEAR 02/06/2016 0345   LABSPEC 1.014 02/06/2016 0345   PHURINE 6.0 02/06/2016 0345   GLUCOSEU NEGATIVE 02/06/2016 0345   HGBUR MODERATE (A)  02/06/2016 0345   BILIRUBINUR NEGATIVE 02/06/2016 0345   KETONESUR NEGATIVE 02/06/2016 0345   PROTEINUR 100 (A) 02/06/2016 0345   UROBILINOGEN 1.0 04/14/2014 0759   NITRITE NEGATIVE 02/06/2016 0345   LEUKOCYTESUR NEGATIVE 02/06/2016 0345   Sepsis Labs Invalid input(s): PROCALCITONIN,  WBC,  LACTICIDVEN Microbiology No results found for this or any previous visit (from the past 240 hour(s)).   Time coordinating discharge: 45 minutes  SIGNED:   Tawni Millers, MD  [facility] 09/02/2018, 11:32 AM Pager [phone]  If 7PM-7AM, please contact night-coverage [email or web address] Password [email or web address]

## 2018-09-04 ENCOUNTER — Telehealth: Payer: Self-pay

## 2018-09-04 LAB — CBC AND DIFFERENTIAL
HCT: 28 — AB (ref 41–53)
HEMOGLOBIN: 9.5 — AB (ref 13.5–17.5)
Platelets: 294 (ref 150–399)
WBC: 6.8

## 2018-09-04 LAB — BASIC METABOLIC PANEL
BUN: 31 — AB (ref 4–21)
Creatinine: 2.3 — AB (ref 0.6–1.3)
Glucose: 111
Potassium: 4.1 (ref 3.4–5.3)
Sodium: 129 — AB (ref 137–147)

## 2018-09-04 NOTE — Telephone Encounter (Signed)
Per Chart Review:  PCP: Vivi Barrack, MD  Admit date: 08/31/2018 Discharge date: 09/02/2018  Admitted From: Home  Disposition:  Home   Recommendations for Outpatient Follow-up and new medication changes:  1. Follow up with Dr. [name] in 7 days.  2. Patient has been placed on daily furosemide to keep negative fluid balance. 3. Follow on renal panel as outpatient.   Home Health: no   Equipment/Devices: no    Discharge Condition: stable  CODE STATUS: full  Diet recommendation: heart healthy and diabetic prudent.   Discharge Diagnoses:  Principal Problem:   Acute diastolic (congestive) heart failure (HCC) Active Problems:   Hypertension associated with diabetes (HCC)   Paroxysmal atrial fibrillation (HCC)   CKD (chronic kidney disease) stage 3, GFR 30-59 ml/min (HCC)   Type 2 diabetes mellitus with stage 3 chronic kidney disease, without long-term current use of insulin (HCC)   Acute exacerbation of CHF (congestive heart failure) ([place])   Anemia due to chronic kidney disease  ________________________________________________________________ Per telephone call: Transition Care Management Follow-up Telephone Call   Date discharged?  09/02/2018   How have you been since you were released from the hospital? Patient states he "feels great".   Do you understand why you were in the hospital? yes   Do you understand the discharge instructions? Yes.  Stated he had been feeling well and taking his medications with no difficulty.  Stated he did not need anything at this time.   Where were you discharged to? Home   Items Reviewed:  Medications reviewed: yes  Allergies reviewed: yes  Dietary changes reviewed: yes  Referrals reviewed: yes   Functional Questionnaire:   Activities of Daily Living (ADLs):   He states they are independent in the following: ambulation, bathing and hygiene, feeding, continence, grooming, toileting and dressing States they require  assistance with the following: None.  Patient was shopping at the market when I spoke with him.   Any transportation issues/concerns?: no   Any patient concerns? no   Confirmed importance and date/time of follow-up visits scheduled yes, Monday 09/08/2018 @ 8:20 am  Provider Appointment booked with Dr. [name]  Confirmed with patient if condition begins to worsen call PCP or go to the ER.  Patient was given the office number and encouraged to call back with question or concerns.  : yes

## 2018-09-08 ENCOUNTER — Encounter: Payer: Self-pay | Admitting: Family Medicine

## 2018-09-08 ENCOUNTER — Ambulatory Visit: Payer: BC Managed Care – PPO | Admitting: Family Medicine

## 2018-09-08 VITALS — BP 126/74 | HR 76 | Temp 98.4°F | Ht 68.0 in | Wt 170.0 lb

## 2018-09-08 DIAGNOSIS — I5032 Chronic diastolic (congestive) heart failure: Secondary | ICD-10-CM | POA: Diagnosis not present

## 2018-09-08 DIAGNOSIS — N183 Chronic kidney disease, stage 3 unspecified: Secondary | ICD-10-CM

## 2018-09-08 DIAGNOSIS — G4700 Insomnia, unspecified: Secondary | ICD-10-CM | POA: Diagnosis not present

## 2018-09-08 DIAGNOSIS — E1159 Type 2 diabetes mellitus with other circulatory complications: Secondary | ICD-10-CM

## 2018-09-08 DIAGNOSIS — I130 Hypertensive heart and chronic kidney disease with heart failure and stage 1 through stage 4 chronic kidney disease, or unspecified chronic kidney disease: Secondary | ICD-10-CM

## 2018-09-08 DIAGNOSIS — I480 Paroxysmal atrial fibrillation: Secondary | ICD-10-CM

## 2018-09-08 DIAGNOSIS — I10 Essential (primary) hypertension: Secondary | ICD-10-CM

## 2018-09-08 NOTE — Assessment & Plan Note (Signed)
At goal.  Continue clonidine 0.1 mg daily, Cardizem 240 mg daily, BiDil 2 tablets twice daily, and labetalol 400 mg 3 times daily.

## 2018-09-08 NOTE — Assessment & Plan Note (Signed)
Rate controlled.  Appears to be in a regular rhythm today.  Continue current medications.

## 2018-09-08 NOTE — Progress Notes (Signed)
Subjective:  Douglas Ballard is a 68 y.o. male who presents today for a TCM visit.  HPI:  Summary of Hospital admission: Reason for admission: Respiratory failure secondary to acute on chronic diastolic heart failure Date of admission: 08/31/2018 Date of discharge: 09/02/2018 Date of Interactive contact: 09/04/2018 Summary of Hospital course: Patient presented to the ED on 08/31/2018 with progressive shortness of breath.  He was found to be volume overloaded and admitted for IV diuresis.  Negative fluid balance was achieved and he has significant provement in his symptoms.  He was discharged home on oral Lasix 40 mg daily.  Interim History:   HFpEF Patient has done well since being discharged.  He has been monitoring his weight daily and has been stable around 170 pounds.  He has been taking Lasix 40 mg daily with no side effects.  No chest pain or shortness of breath.  No leg swelling.  No orthopnea.  He has been avoiding salt.  CKD stage III Patient followed up with nephrology last week.  He will be following up with them every 3 months for the next year.  They will be monitoring his renal function.  His stable, chronic medical conditions are outlined below:  # T2DM -Currently on Januvia 100 mg daily and tolerating well  # Insomnia - Currently on Ambien 10 mg daily as needed and tolerating well  # Hypertension / Paroxysmal Afib - Currently on labetalol 443m three times daily, clonidine 0.1 mg 2 times daily, Bidil 20-37.560m2 tablets twice daily, and cardizem 24023maily -Anticoagulated on Xarelto 15 mg daily.  ROS: Per HPI, otherwise a complete review of systems was negative.   PMH:  The following were reviewed and entered/updated in epic: Past Medical History:  Diagnosis Date  . Alcohol withdrawal seizure (HCCLargo1/03/2011   Alcohol withdraw seizure prior to admission is suspected from history given by family & Post ictal appearance in the ED.   . AMarland Kitchenxiety   . Atrial  fibrillation ([facility]CC[facility] . [facility]   Prostate surgery  . Chronic diastolic CHF (congestive heart failure) (HCC[place]ruit[place]/14/2016   Echo 8/15: Mild LVH, EF 50-55%, moderate BAE  //  b. Echo 7/17: EF 55-60%, normal wall motion, grade 2 diastolic dysfunction, MAC, mild MR, moderate LAE, trivial PI  . Compression fracture of thoracic vertebra (HCCStan[place]1/01/2011  . Diabetes mellitus type 2 in nonobese (HCC)   . Fistula, bladder   . Frequent urination at night   . History of adenomatous polyp of colon 06/14/2014  . History of nuclear stress test    a. Myoview 10/15: Overall Impression: Low risk stress nuclear study demonstrating mild baseline ST-T changes with normal myocardial perfusion and low normal EF of 50%. // b.Myoview 7/17: EF 50%, no ischemia or scar, low risk (EF normal by recent echo)  . Hypertension   . PNA (pneumonia) 06/26/2011  . Sepsis due to urinary tract infection (HCCDe Smet/28/2015  . Stroke ([facility]CCSh[facility] Heightsept 1, 2015   tia x 3   Patient Active Problem List   Diagnosis Date Noted  . Anemia due to chronic kidney disease 08/31/2018  . Stress 08/01/2018  . Type 2 diabetes mellitus with stage 3 chronic kidney disease, without long-term current use of insulin (HCCMel[place]1/18/2019  . CKD (chronic kidney disease) stage 3, GFR 30-59 ml/min (HCC) 02/07/2016  . Insomnia 09/19/2015  . Coarse tremors 09/19/2015  . Chronic diastolic CHF (congestive heart failure) (HCCCanones3/14/2016  . History of adenomatous polyp of  colon 06/14/2014  . Paroxysmal atrial fibrillation ([place]) 05/19/2014  . Elevated bilirubin 04/14/2014  . 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 Alcohol use disorder, moderate, dependence ([place]) 06/27/2011  . Hypertension associated with diabetes ([place]) 06/26/2011  . Personal history of prostate cancer s/p prostatectomy 2012 06/26/2011   Past Surgical History:  Procedure Laterality Date  . COLON SURGERY    . COLONOSCOPY N/A 06/14/2014   Procedure: COLONOSCOPY;  Surgeon:  Gatha Mayer, MD;  Location: [facility];  Service: Endoscopy;  Laterality: N/A;  . CYSTOSCOPY WITH STENT PLACEMENT Bilateral 06/15/2014   Procedure: CYSTOSCOPY WITH BILATERAL STENT PLACEMENT;  Surgeon: Bernestine Amass, MD;  Location: [facility]S;  Service: Urology;  Laterality: Bilateral;  . LIPOMA EXCISION  2012   Dr Nonah Mattes  . moles removed     Back  . PROCTOSCOPY N/A 06/15/2014   Procedure: RIGID PROCTOSCOPY;  Surgeon: Michael Boston, MD;  Location: [facility];  Service: General;  Laterality: N/A;  . RADIOLOGY WITH ANESTHESIA N/A 07/28/2014   Procedure: Embolization;  Surgeon: Luanne Bras, MD;  Location: [place];  Service: Radiology;  Laterality: N/A;  . ROBOT ASSISTED LAPAROSCOPIC RADICAL PROSTATECTOMY  01/31/2011   Robotic-assisted laparoscopic radical retropubic    Family History  Problem Relation Age of Onset  . Atrial fibrillation Father        onset 18s. Had pacemaker placed for sinus pause  . Prostate cancer Father   . Breast cancer Mother   . Lung cancer Mother   . Leukemia Brother   . Colon polyps Brother   . Colon cancer Neg Hx   . Diabetes Neg Hx     Medications- Reconciled discharge and current medications in Epic.  Current Outpatient Medications  Medication Sig Dispense Refill  . cloNIDine (CATAPRES) 0.1 MG tablet Take 0.1 mg by mouth 2 (two) times daily.     Marland Kitchen diltiazem (CARDIZEM CD) 240 MG 24 hr capsule TAKE ONE CAPSULE BY MOUTH EVERY MORNING 90 capsule 0  . ferrous sulfate 325 (65 FE) MG tablet Take 1 tablet (325 mg total) by mouth daily. 30 tablet 0  . furosemide (LASIX) 40 MG tablet Take 1 tablet (40 mg total) by mouth daily for 30 days. 30 tablet 0  . hydrOXYzine (ATARAX/VISTARIL) 50 MG tablet Take 0.5-2 tablets (25-100 mg total) by mouth at bedtime as needed for anxiety (insomnia). 90 tablet 1  . isosorbide-hydrALAZINE (BIDIL) 20-37.5 MG tablet Take 2 tablets by mouth 2 (two) times daily.     Marland Kitchen JANUVIA 100 MG tablet TAKE 1 TABLET BY MOUTH EVERY DAY 90 tablet  1  . labetalol (NORMODYNE) 200 MG tablet Take 2 tablets (400 mg total) by mouth 3 (three) times daily. (Patient taking differently: Take 200 mg by mouth 2 (two) times daily. ) 180 tablet 0  . XARELTO 15 MG TABS tablet TAKE 1 TABLET (15 MG TOTAL) BY MOUTH DAILY WITH SUPPER. 90 tablet 1  . zolpidem (AMBIEN) 10 MG tablet TAKE 1 TABLET (10 MG TOTAL) BY MOUTH AT BEDTIME AS NEEDED. FOR SLEEP 30 tablet 5   No current facility-administered medications for this visit.     Allergies-reviewed and updated Allergies  Allergen Reactions  . Hydrochlorothiazide Other (See Comments)    Pt gets hyponatremia  . Lasix [Furosemide] Other (See Comments)    Sodium levels drop when take  . Nsaids Other (See Comments)    Kidney disease/failure  . Sulfa Antibiotics Other (See Comments)    headaches    Social History  Socioeconomic History  . Marital status: Married    Spouse name: Not on file  . Number of children: 2  . Years of education: Not on file  . Highest education level: Not on file  Occupational History  . Occupation: TEFL teacher  Social Needs  . Financial resource strain: Not on file  . Food insecurity:    Worry: Not on file    Inability: Not on file  . Transportation needs:    Medical: Not on file    Non-medical: Not on file  Tobacco Use  . Smoking status: Former Smoker    Packs/day: 2.00    Years: 25.00    Pack years: 50.00    Types: Cigarettes    Last attempt to quit: 04/14/2014    Years since quitting: 4.4  . Smokeless tobacco: Never Used  Substance and Sexual Activity  . Alcohol use: Yes    Alcohol/week: 7.0 - 10.0 standard drinks    Types: 7 - 10 Standard drinks or equivalent per week    Comment: 2 bottles of wine with wife a week; cocktail every other night  . Drug use: No  . Sexual activity: Not Currently  Lifestyle  . Physical activity:    Days per week: Not on file    Minutes per session: Not on file  . Stress: Not on file  Relationships  .  Social connections:    Talks on phone: Not on file    Gets together: Not on file    Attends religious service: Not on file    Active member of club or organization: Not on file    Attends meetings of clubs or organizations: Not on file    Relationship status: Not on file  Other Topics Concern  . Not on file  Social History Narrative   Fun/Hobby: Walk, cards, gardening     Objective:  Physical Exam: BP 126/74 (BP Location: Left Arm, Patient Position: Sitting, Cuff Size: Normal)   Pulse 76   Temp 98.4 F (36.9 C) (Oral)   Ht _0  (1.727 m)   Wt 170 lb (77.1 kg)   SpO2 98%   BMI 25.85 kg/m   Wt Readings from Last 3 Encounters:  09/08/18 170 lb (77.1 kg)  09/02/18 173 lb 11.6 oz (78.8 kg)  08/01/18 184 lb 9.6 oz (83.7 kg)  Gen: NAD, resting comfortably CV: RRR with no murmurs appreciated Pulm: NWOB, CTAB with no crackles, wheezes, or rhonchi GI: Normal bowel sounds present. Soft, Nontender, Nondistended. MSK: No edema, cyanosis, or clubbing noted Skin: Warm, dry Neuro: Grossly normal, moves all extremities Psych: Normal affect and thought content  Assessment/Plan:  Paroxysmal atrial fibrillation (HCC) Rate controlled.  Appears to be in a regular rhythm today.  Continue current medications.  Insomnia Stable.  Continue Ambien as needed.  Hypertension associated with diabetes ([place]) At goal.  Continue clonidine 0.1 mg daily, Cardizem 240 mg daily, BiDil 2 tablets twice daily, and labetalol 400 mg 3 times daily.   CKD (chronic kidney disease) stage 3, GFR 30-59 ml/min (HCC) Stable.  Follows up with nephrology in a couple of months.  Chronic diastolic CHF (congestive heart failure) (HCC) No signs of volume overload today.  Continue Lasix 40 mg daily.  He will continue monitoring weight.  Discussed importance of salt restriction.  Patient has a modertte level of medical complexity due to number of diagnoses/treatment options and amount/complexity of data reviewed.   [name]. [name], MD 09/08/2018 9:23 AM

## 2018-09-08 NOTE — Assessment & Plan Note (Signed)
No signs of volume overload today.  Continue Lasix 40 mg daily.  He will continue monitoring weight.  Discussed importance of salt restriction.

## 2018-09-08 NOTE — Assessment & Plan Note (Signed)
Stable.  Continue Ambien as needed. 

## 2018-09-08 NOTE — Assessment & Plan Note (Signed)
Stable.  Follows up with nephrology in a couple of months.

## 2018-09-08 NOTE — Patient Instructions (Signed)
It was very nice to see you today!  I am glad that you are feeling better!  No changes today.  Come back to see me in May for your follow up visit, or sooner as needed.   Take care, Dr [name]

## 2018-09-09 ENCOUNTER — Encounter: Payer: Self-pay | Admitting: Physical Therapy

## 2018-09-10 ENCOUNTER — Other Ambulatory Visit: Payer: Self-pay | Admitting: Cardiology

## 2018-09-10 ENCOUNTER — Encounter: Payer: Self-pay | Admitting: Family Medicine

## 2018-09-12 ENCOUNTER — Other Ambulatory Visit: Payer: Self-pay | Admitting: Physician Assistant

## 2018-10-03 ENCOUNTER — Other Ambulatory Visit: Payer: Self-pay | Admitting: Family Medicine

## 2018-10-03 MED ORDER — FERROUS SULFATE 325 (65 FE) MG PO TABS: 325.0000 mg | ORAL_TABLET | Freq: Every day | ORAL | 0 refills | Status: DC

## 2018-10-03 NOTE — Telephone Encounter (Signed)
See note

## 2018-10-03 NOTE — Telephone Encounter (Signed)
Copied from [place] [id number]. Topic: Quick Communication - Rx Refill/Question >> Oct 03, 2018  7:49 AM Carolyn Stare wrote: Medication ferrous sulfate 325 (65 FE) MG tablet    pt said this med was given to him in the hospital   Has the patient contacted their pharmacy yes    Preferred Pharmacy   [facility]   Agent: Please be advised that RX refills may take up to 3 business days. We ask that you follow-up with your pharmacy.

## 2018-10-03 NOTE — Telephone Encounter (Signed)
Hallam with me. He can also get OTC.  [name]. [name], MD 10/03/2018 9:25 AM

## 2018-10-03 NOTE — Telephone Encounter (Signed)
This has been sent to the pharmacy

## 2018-10-03 NOTE — Telephone Encounter (Signed)
Will route to office for final disposition; pt last seen by Dr Dimas Chyle, [place], 09/08/2018.  Requested medication (s) are due for refill today: Ferrous Sulfate  Requested medication (s) are on the active medication list: yes  Last refill:  09/02/2018  Future visit scheduled: no  Notes to clinic:  Prescription authorized by Mauricio Arrien; pt at [facility] 08/31/2018 -09/02/2018

## 2018-10-03 NOTE — Telephone Encounter (Signed)
Please advise.  Not prescribed by you.

## 2018-10-07 ENCOUNTER — Other Ambulatory Visit: Payer: Self-pay | Admitting: Family Medicine

## 2018-10-07 MED ORDER — FUROSEMIDE 40 MG PO TABS: 40.0000 mg | ORAL_TABLET | Freq: Every day | ORAL | 0 refills | Status: DC

## 2018-10-07 NOTE — Telephone Encounter (Signed)
See note

## 2018-10-07 NOTE — Telephone Encounter (Signed)
Copied from [place] [id number]. Topic: Quick Communication - Rx Refill/Question >> Oct 07, 2018 10:40 AM Blase Mess A wrote: Medication: furosemide (LASIX) 40 MG tablet [[id number]]  ENDED   Has the patient contacted their pharmacy? Yes  (Agent: If no, request that the patient contact the pharmacy for the refill.) (Agent: If yes, when and what did the pharmacy advise?)  Preferred Pharmacy (with phone number or street name): [id number] [place], [place] [phone] (Phone) [phone] (Fax)    Agent: Please be advised that RX refills may take up to 3 business days. We ask that you follow-up with your pharmacy.

## 2018-10-07 NOTE — Telephone Encounter (Signed)
Requested medication (s) are due for refill today: no  Requested medication (s) are on the active medication list: yes  Last refill:  09/03/2018  Future visit scheduled: no  Notes to clinic:  Original prescription per : 1 month ago by Tawni Millers, MD; seen in office 08/2018 by Dr Dimas Chyle    Requested Prescriptions  Pending Prescriptions Disp Refills   furosemide (LASIX) 40 MG tablet 30 tablet 0    Sig: Take 1 tablet (40 mg total) by mouth daily for 30 days.     Cardiovascular:  Diuretics - Loop Failed - 10/07/2018 10:44 AM      Failed - Ca in normal range and within 360 days    Calcium  Date Value Ref Range Status  09/02/2018 7.9 (L) 8.9 - 10.3 mg/dL Final         Failed - Na in normal range and within 360 days    Sodium  Date Value Ref Range Status  09/04/2018 129 (A) 137 - 147 Final         Failed - Cr in normal range and within 360 days    Creatinine  Date Value Ref Range Status  09/04/2018 2.3 (A) 0.6 - 1.3 Final   Creatinine, Ser  Date Value Ref Range Status  09/02/2018 2.15 (H) 0.61 - 1.24 mg/dL Final         Passed - K in normal range and within 360 days    Potassium  Date Value Ref Range Status  09/04/2018 4.1 3.4 - 5.3 Final         Passed - Last BP in normal range    BP Readings from Last 1 Encounters:  09/08/18 126/74         Passed - Valid encounter within last 6 months    Recent Outpatient Visits          4 weeks ago CKD (chronic kidney disease) stage 3, GFR 30-59 ml/min (HCC)   [facility], [name], MD   2 months ago Type 2 diabetes mellitus with stage 3 chronic kidney disease, without long-term current use of insulin ([place])   [facility], [name], MD   8 months ago Type 2 diabetes mellitus with stage 3 chronic kidney disease, without long-term current use of insulin (HCC)   [facility], [name], MD   1 year ago Primary insomnia   [facility], [name], MD   1 year ago Routine adult health maintenance   Cabell, Gregory D, FNP

## 2018-10-08 ENCOUNTER — Other Ambulatory Visit: Payer: Self-pay | Admitting: Physician Assistant

## 2018-10-08 DIAGNOSIS — L910 Hypertrophic scar: Secondary | ICD-10-CM | POA: Diagnosis not present

## 2018-10-08 DIAGNOSIS — L720 Epidermal cyst: Secondary | ICD-10-CM | POA: Diagnosis not present

## 2018-10-22 ENCOUNTER — Other Ambulatory Visit: Payer: Self-pay | Admitting: Physician Assistant

## 2018-10-22 DIAGNOSIS — L720 Epidermal cyst: Secondary | ICD-10-CM | POA: Diagnosis not present

## 2018-10-25 ENCOUNTER — Other Ambulatory Visit: Payer: Self-pay | Admitting: Family Medicine

## 2018-10-31 ENCOUNTER — Other Ambulatory Visit: Payer: Self-pay | Admitting: Family Medicine

## 2018-10-31 NOTE — Telephone Encounter (Signed)
Ok to fill? Pt currently takes Ambien also for sleep.    Please advise

## 2018-11-04 NOTE — Telephone Encounter (Signed)
Ok with me. Please place any necessary orders. 

## 2018-11-04 NOTE — Telephone Encounter (Signed)
Rx sent to pharmacy   

## 2018-11-08 ENCOUNTER — Other Ambulatory Visit: Payer: Self-pay | Admitting: Family Medicine

## 2018-11-21 ENCOUNTER — Other Ambulatory Visit: Payer: Self-pay | Admitting: Family Medicine

## 2018-11-24 ENCOUNTER — Ambulatory Visit (INDEPENDENT_AMBULATORY_CARE_PROVIDER_SITE_OTHER): Payer: BC Managed Care – PPO | Admitting: Family Medicine

## 2018-11-24 ENCOUNTER — Ambulatory Visit: Payer: Self-pay | Admitting: *Deleted

## 2018-11-24 ENCOUNTER — Encounter: Payer: Self-pay | Admitting: Family Medicine

## 2018-11-24 VITALS — BP 151/81 | HR 70 | Temp 98.0°F | Ht 68.0 in | Wt 180.0 lb

## 2018-11-24 DIAGNOSIS — W540XXA Bitten by dog, initial encounter: Secondary | ICD-10-CM

## 2018-11-24 DIAGNOSIS — S61412A Laceration without foreign body of left hand, initial encounter: Secondary | ICD-10-CM

## 2018-11-24 MED ORDER — AMOXICILLIN-POT CLAVULANATE 875-125 MG PO TABS: 1.0000 | ORAL_TABLET | Freq: Two times a day (BID) | ORAL | 0 refills | Status: DC

## 2018-11-24 NOTE — Telephone Encounter (Signed)
[place] for [employer].  [name]. [name], MD 11/24/2018 9:23 AM

## 2018-11-24 NOTE — Telephone Encounter (Signed)
Please advise.  Ok for doxy appt?

## 2018-11-24 NOTE — Telephone Encounter (Signed)
See triage note.

## 2018-11-24 NOTE — Telephone Encounter (Signed)
Please schedule patient for doxy.me appointment.  Thank you!!

## 2018-11-24 NOTE — Telephone Encounter (Signed)
Pts wife calling, on [place]. States pt bit by pet dog; pulling 2 dos away from fighting, both are his own dogs. Incident occurred Friday. Sustained bit top of left hand, between fingers., one puncture wound. States area red, tender, "Moderately" swollen. On anticoagulate, bleed initially, not presently. States cleansed with antibacterial soap, and applied Band-Aid. LAst tetanus possibly JAn. 2020, unsure. Noted in chart 2015. Phone number and email verified.  CB number [phone]. Please advise regarding appt.  Reason for Disposition . [1] Puncture wound or small cut AND [2] on hands or genitals  Answer Assessment - Initial Assessment Questions 1. ANIMAL: "What type of animal caused the bite?" "Is the injury from a bite or a claw?" If the animal is a dog or a cat, ask: "Was it a pet or a stray?" "Was it acting ill or behaving strangely?"    Pet dog 2. LOCATION: "Where is the bite located?"      Top of left hand 3. SIZE: "How big is the bite?" "What does it look like?"      One puncture mark 4. ONSET: "When did the bite happen?" (Minutes or hours ago)      Friday 5. CIRCUMSTANCES: "Tell me how this happened."      Broke up fight of two pet dogs 6. TETANUS: "When was the last tetanus booster?" 2015 possibly  Protocols used: ANIMAL BITE-A-AH

## 2018-11-24 NOTE — Progress Notes (Signed)
    Chief Complaint:  Pardeep Pautz is a 68 y.o. male who presents today for a virtual office visit with a chief complaint of dog bite.   Assessment/Plan:  Hand Laceration/dog bite Will cover prophylactically with course of Augmentin.  He is up-to-date on his tetanus vaccine.  No signs of compartment syndrome or deep space infection based on visual exam.  Discussed reasons to return to care and seek emergent care.      Subjective:  HPI:  Dog Bite Patient broke up a dog fight 5 days ago.  While breaking up a fight, the dog bit him on his left hand.  Both of these dogs are as pets interrupted under immunizations.  He cleaned the area out however is had some worsening drainage and swelling over the last couple of days.  No other treatments tried.  He has full range of motion.  No numbness.  Symptoms seem to be worse after certain activities such as moving things. No other obvious alleviating or aggravating factors.   ROS: Per HPI  PMH: He reports that he quit smoking about 4 years ago. His smoking use included cigarettes. He has a 50.00 pack-year smoking history. He has never used smokeless tobacco. He reports current alcohol use of about 7.0 - 10.0 standard drinks of alcohol per week. He reports that he does not use drugs.      Objective/Observations  Physical Exam: Gen: NAD, resting comfortably Pulm: Normal work of breathing MSK: Hands with central small 5 mm laceration to the second web space.  Small amount of surrounding erythema and edema noted.  Full range of motion.  Able to make fist and move fingers without issue. Neuro: Grossly normal, moves all extremities Psych: Normal affect and thought content  Virtual Visit via Video   I connected with [name] on 11/24/18 at  1:00 PM EDT by a video enabled telemedicine application and verified that I am speaking with the correct person using two identifiers. I discussed the limitations of evaluation and management by telemedicine  and the availability of in person appointments. The patient expressed understanding and agreed to proceed.   Patient location: Home Provider location: [place] participating in the virtual visit: Myself and patient     [name]. [name], MD 11/24/2018 1:15 PM

## 2018-11-24 NOTE — Patient Instructions (Signed)
Health Maintenance Due  Topic Date Due  . PNA vac Low Risk Adult (2 of 2 - PPSV23) 05/28/2018    Depression screen PHQ 2/9 11/24/2018 05/28/2017  Decreased Interest 0 0  Down, Depressed, Hopeless 0 0  PHQ - 2 Score 0 0

## 2018-11-25 ENCOUNTER — Other Ambulatory Visit: Payer: Self-pay

## 2018-11-25 ENCOUNTER — Observation Stay (HOSPITAL_COMMUNITY)
Admission: EM | Admit: 2018-11-25 | Discharge: 2018-11-26 | Disposition: A | Discharge status: Home / Self Care | Payer: BC Managed Care – PPO | Attending: Family Medicine | Admitting: Family Medicine

## 2018-11-25 ENCOUNTER — Encounter (HOSPITAL_COMMUNITY): Payer: Self-pay | Admitting: Emergency Medicine

## 2018-11-25 ENCOUNTER — Emergency Department (HOSPITAL_COMMUNITY): Payer: BC Managed Care – PPO

## 2018-11-25 ENCOUNTER — Observation Stay (HOSPITAL_COMMUNITY): Payer: BC Managed Care – PPO

## 2018-11-25 DIAGNOSIS — Z87891 Personal history of nicotine dependence: Secondary | ICD-10-CM | POA: Diagnosis not present

## 2018-11-25 DIAGNOSIS — S61452A Open bite of left hand, initial encounter: Secondary | ICD-10-CM | POA: Diagnosis not present

## 2018-11-25 DIAGNOSIS — N179 Acute kidney failure, unspecified: Secondary | ICD-10-CM | POA: Diagnosis not present

## 2018-11-25 DIAGNOSIS — N183 Chronic kidney disease, stage 3 (moderate): Secondary | ICD-10-CM

## 2018-11-25 DIAGNOSIS — Z8042 Family history of malignant neoplasm of prostate: Secondary | ICD-10-CM | POA: Insufficient documentation

## 2018-11-25 DIAGNOSIS — W540XXA Bitten by dog, initial encounter: Secondary | ICD-10-CM | POA: Insufficient documentation

## 2018-11-25 DIAGNOSIS — J90 Pleural effusion, not elsewhere classified: Secondary | ICD-10-CM | POA: Diagnosis not present

## 2018-11-25 DIAGNOSIS — Z886 Allergy status to analgesic agent status: Secondary | ICD-10-CM | POA: Diagnosis not present

## 2018-11-25 DIAGNOSIS — N189 Chronic kidney disease, unspecified: Secondary | ICD-10-CM

## 2018-11-25 DIAGNOSIS — Z888 Allergy status to other drugs, medicaments and biological substances status: Secondary | ICD-10-CM | POA: Insufficient documentation

## 2018-11-25 DIAGNOSIS — I5031 Acute diastolic (congestive) heart failure: Secondary | ICD-10-CM

## 2018-11-25 DIAGNOSIS — R0602 Shortness of breath: Secondary | ICD-10-CM

## 2018-11-25 DIAGNOSIS — I130 Hypertensive heart and chronic kidney disease with heart failure and stage 1 through stage 4 chronic kidney disease, or unspecified chronic kidney disease: Principal | ICD-10-CM | POA: Insufficient documentation

## 2018-11-25 DIAGNOSIS — Z801 Family history of malignant neoplasm of trachea, bronchus and lung: Secondary | ICD-10-CM | POA: Diagnosis not present

## 2018-11-25 DIAGNOSIS — Z79899 Other long term (current) drug therapy: Secondary | ICD-10-CM | POA: Diagnosis not present

## 2018-11-25 DIAGNOSIS — Z8673 Personal history of transient ischemic attack (TIA), and cerebral infarction without residual deficits: Secondary | ICD-10-CM | POA: Diagnosis not present

## 2018-11-25 DIAGNOSIS — I480 Paroxysmal atrial fibrillation: Secondary | ICD-10-CM | POA: Diagnosis present

## 2018-11-25 DIAGNOSIS — Z8249 Family history of ischemic heart disease and other diseases of the circulatory system: Secondary | ICD-10-CM | POA: Insufficient documentation

## 2018-11-25 DIAGNOSIS — R112 Nausea with vomiting, unspecified: Secondary | ICD-10-CM | POA: Diagnosis not present

## 2018-11-25 DIAGNOSIS — Z803 Family history of malignant neoplasm of breast: Secondary | ICD-10-CM | POA: Insufficient documentation

## 2018-11-25 DIAGNOSIS — R079 Chest pain, unspecified: Secondary | ICD-10-CM | POA: Diagnosis not present

## 2018-11-25 DIAGNOSIS — D631 Anemia in chronic kidney disease: Secondary | ICD-10-CM | POA: Diagnosis present

## 2018-11-25 DIAGNOSIS — Z882 Allergy status to sulfonamides status: Secondary | ICD-10-CM | POA: Diagnosis not present

## 2018-11-25 DIAGNOSIS — E1122 Type 2 diabetes mellitus with diabetic chronic kidney disease: Secondary | ICD-10-CM

## 2018-11-25 DIAGNOSIS — Z8546 Personal history of malignant neoplasm of prostate: Secondary | ICD-10-CM

## 2018-11-25 DIAGNOSIS — Z9111 Patient's noncompliance with dietary regimen: Secondary | ICD-10-CM | POA: Insufficient documentation

## 2018-11-25 DIAGNOSIS — Z7289 Other problems related to lifestyle: Secondary | ICD-10-CM | POA: Insufficient documentation

## 2018-11-25 DIAGNOSIS — Z7901 Long term (current) use of anticoagulants: Secondary | ICD-10-CM | POA: Diagnosis not present

## 2018-11-25 DIAGNOSIS — F419 Anxiety disorder, unspecified: Secondary | ICD-10-CM | POA: Diagnosis not present

## 2018-11-25 DIAGNOSIS — N184 Chronic kidney disease, stage 4 (severe): Secondary | ICD-10-CM | POA: Diagnosis present

## 2018-11-25 DIAGNOSIS — I4891 Unspecified atrial fibrillation: Secondary | ICD-10-CM | POA: Diagnosis not present

## 2018-11-25 LAB — CBC WITH DIFFERENTIAL/PLATELET
Abs Immature Granulocytes: 0.02 10*3/uL (ref 0.00–0.07)
Abs Immature Granulocytes: 0.03 10*3/uL (ref 0.00–0.07)
Basophils Absolute: 0 10*3/uL (ref 0.0–0.1)
Basophils Absolute: 0 10*3/uL (ref 0.0–0.1)
Basophils Relative: 0 %
Basophils Relative: 1 %
Eosinophils Absolute: 0.1 10*3/uL (ref 0.0–0.5)
Eosinophils Absolute: 0.1 10*3/uL (ref 0.0–0.5)
Eosinophils Relative: 2 %
Eosinophils Relative: 2 %
HCT: 26.8 % — ABNORMAL LOW (ref 39.0–52.0)
HCT: 27 % — ABNORMAL LOW (ref 39.0–52.0)
Hemoglobin: 9 g/dL — ABNORMAL LOW (ref 13.0–17.0)
Hemoglobin: 8.8 g/dL — ABNORMAL LOW (ref 13.0–17.0)
Immature Granulocytes: 0 %
Immature Granulocytes: 1 %
Lymphocytes Relative: 7 %
Lymphocytes Relative: 7 %
Lymphs Abs: 0.6 10*3/uL — ABNORMAL LOW (ref 0.7–4.0)
Lymphs Abs: 0.4 10*3/uL — ABNORMAL LOW (ref 0.7–4.0)
MCH: 33 pg (ref 26.0–34.0)
MCH: 32.2 pg (ref 26.0–34.0)
MCHC: 33.6 g/dL (ref 30.0–36.0)
MCHC: 32.6 g/dL (ref 30.0–36.0)
MCV: 98.2 fL (ref 80.0–100.0)
MCV: 98.9 fL (ref 80.0–100.0)
Monocytes Absolute: 0.9 10*3/uL (ref 0.1–1.0)
Monocytes Absolute: 0.7 10*3/uL (ref 0.1–1.0)
Monocytes Relative: 11 %
Monocytes Relative: 11 %
Neutro Abs: 6.2 10*3/uL (ref 1.7–7.7)
Neutro Abs: 5.3 10*3/uL (ref 1.7–7.7)
Neutrophils Relative %: 80 %
Neutrophils Relative %: 78 %
Platelets: 214 10*3/uL (ref 150–400)
Platelets: 199 10*3/uL (ref 150–400)
RBC: 2.73 MIL/uL — ABNORMAL LOW (ref 4.22–5.81)
RBC: 2.73 MIL/uL — ABNORMAL LOW (ref 4.22–5.81)
RDW: 12.6 % (ref 11.5–15.5)
RDW: 12.4 % (ref 11.5–15.5)
WBC: 7.9 10*3/uL (ref 4.0–10.5)
WBC: 6.6 10*3/uL (ref 4.0–10.5)
nRBC: 0 % (ref 0.0–0.2)
nRBC: 0 % (ref 0.0–0.2)

## 2018-11-25 LAB — TROPONIN I
Troponin I: <0.03 ng/mL (ref <0.03)
Troponin I: <0.03 ng/mL (ref <0.03)
Troponin I: <0.03 ng/mL (ref <0.03)
Troponin I: <0.03 ng/mL (ref <0.03)

## 2018-11-25 LAB — GLUCOSE, CAPILLARY
Glucose-Capillary: 143 mg/dL — ABNORMAL HIGH (ref 70–99)
Glucose-Capillary: 139 mg/dL — ABNORMAL HIGH (ref 70–99)
Glucose-Capillary: 114 mg/dL — ABNORMAL HIGH (ref 70–99)
Glucose-Capillary: 107 mg/dL — ABNORMAL HIGH (ref 70–99)

## 2018-11-25 LAB — BASIC METABOLIC PANEL
Anion gap: 12 (ref 5–15)
Anion gap: 11 (ref 5–15)
Anion gap: 10 (ref 5–15)
BUN: 50 mg/dL — ABNORMAL HIGH (ref 8–23)
BUN: 49 mg/dL — ABNORMAL HIGH (ref 8–23)
BUN: 45 mg/dL — ABNORMAL HIGH (ref 8–23)
CO2: 20 mmol/L — ABNORMAL LOW (ref 22–32)
CO2: 21 mmol/L — ABNORMAL LOW (ref 22–32)
CO2: 22 mmol/L (ref 22–32)
Calcium: 8.6 mg/dL — ABNORMAL LOW (ref 8.9–10.3)
Calcium: 8.6 mg/dL — ABNORMAL LOW (ref 8.9–10.3)
Calcium: 8.4 mg/dL — ABNORMAL LOW (ref 8.9–10.3)
Chloride: 96 mmol/L — ABNORMAL LOW (ref 98–111)
Chloride: 97 mmol/L — ABNORMAL LOW (ref 98–111)
Chloride: 100 mmol/L (ref 98–111)
Creatinine, Ser: 3.47 mg/dL — ABNORMAL HIGH (ref 0.61–1.24)
Creatinine, Ser: 3.39 mg/dL — ABNORMAL HIGH (ref 0.61–1.24)
Creatinine, Ser: 3.23 mg/dL — ABNORMAL HIGH (ref 0.61–1.24)
GFR calc Af Amer: 20 mL/min — ABNORMAL LOW (ref >60)
GFR calc Af Amer: 21 mL/min — ABNORMAL LOW (ref >60)
GFR calc Af Amer: 22 mL/min — ABNORMAL LOW (ref >60)
GFR calc non Af Amer: 17 mL/min — ABNORMAL LOW (ref >60)
GFR calc non Af Amer: 18 mL/min — ABNORMAL LOW (ref >60)
GFR calc non Af Amer: 19 mL/min — ABNORMAL LOW (ref >60)
Glucose, Bld: 187 mg/dL — ABNORMAL HIGH (ref 70–99)
Glucose, Bld: 172 mg/dL — ABNORMAL HIGH (ref 70–99)
Glucose, Bld: 92 mg/dL (ref 70–99)
Potassium: 3.9 mmol/L (ref 3.5–5.1)
Potassium: 4.1 mmol/L (ref 3.5–5.1)
Potassium: 4 mmol/L (ref 3.5–5.1)
Sodium: 128 mmol/L — ABNORMAL LOW (ref 135–145)
Sodium: 129 mmol/L — ABNORMAL LOW (ref 135–145)
Sodium: 132 mmol/L — ABNORMAL LOW (ref 135–145)

## 2018-11-25 LAB — HEPATIC FUNCTION PANEL
ALT: 15 U/L (ref 0–44)
AST: 13 U/L — ABNORMAL LOW (ref 15–41)
Albumin: 3.6 g/dL (ref 3.5–5.0)
Alkaline Phosphatase: 40 U/L (ref 38–126)
Bilirubin, Direct: 0.1 mg/dL (ref 0.0–0.2)
Indirect Bilirubin: 0.6 mg/dL (ref 0.3–0.9)
Total Bilirubin: 0.7 mg/dL (ref 0.3–1.2)
Total Protein: 6.7 g/dL (ref 6.5–8.1)

## 2018-11-25 LAB — D-DIMER, QUANTITATIVE: D-Dimer, Quant: 0.63 ug/mL-FEU — ABNORMAL HIGH (ref 0.00–0.50)

## 2018-11-25 LAB — LIPASE, BLOOD: Lipase: 53 U/L — ABNORMAL HIGH (ref 11–51)

## 2018-11-25 LAB — BRAIN NATRIURETIC PEPTIDE: B Natriuretic Peptide: 409.1 pg/mL — ABNORMAL HIGH (ref 0.0–100.0)

## 2018-11-25 MED ORDER — VITAMIN B-1 100 MG PO TABS
100.0000 mg | ORAL_TABLET | Freq: Every day | ORAL | Status: DC
  Administered 2018-11-25 – 2018-11-26 (×2): 100 mg via ORAL
  Filled 2018-11-25 (×2): qty 1

## 2018-11-25 MED ORDER — ONDANSETRON HCL 4 MG/2ML IJ SOLN
4.0000 mg | Freq: Once | INTRAMUSCULAR | Status: AC
  Administered 2018-11-25: 4 mg via INTRAVENOUS
  Filled 2018-11-25: qty 2

## 2018-11-25 MED ORDER — LABETALOL HCL 200 MG PO TABS
400.0000 mg | ORAL_TABLET | Freq: Two times a day (BID) | ORAL | Status: DC
  Administered 2018-11-25 – 2018-11-26 (×3): 400 mg via ORAL
  Filled 2018-11-25 (×3): qty 2

## 2018-11-25 MED ORDER — LORAZEPAM 2 MG/ML IJ SOLN: 1.0000 mg | Freq: Four times a day (QID) | INTRAMUSCULAR | Status: DC | PRN

## 2018-11-25 MED ORDER — ONDANSETRON HCL 4 MG/2ML IJ SOLN: 4.0000 mg | Freq: Four times a day (QID) | INTRAMUSCULAR | Status: DC | PRN

## 2018-11-25 MED ORDER — ACETAMINOPHEN 650 MG RE SUPP: 650.0000 mg | Freq: Four times a day (QID) | RECTAL | Status: DC | PRN

## 2018-11-25 MED ORDER — LORAZEPAM 1 MG PO TABS: 0.0000 mg | ORAL_TABLET | Freq: Two times a day (BID) | ORAL | Status: DC

## 2018-11-25 MED ORDER — ISOSORB DINITRATE-HYDRALAZINE 20-37.5 MG PO TABS
2.0000 | ORAL_TABLET | Freq: Two times a day (BID) | ORAL | Status: DC
  Administered 2018-11-25 – 2018-11-26 (×3): 2 via ORAL
  Filled 2018-11-25 (×4): qty 2

## 2018-11-25 MED ORDER — ASPIRIN 81 MG PO CHEW: 324.0000 mg | CHEWABLE_TABLET | Freq: Once | ORAL | Status: DC

## 2018-11-25 MED ORDER — LORAZEPAM 1 MG PO TABS
1.0000 mg | ORAL_TABLET | Freq: Four times a day (QID) | ORAL | Status: DC | PRN
  Administered 2018-11-25: 15:00:00 1 mg via ORAL

## 2018-11-25 MED ORDER — ADULT MULTIVITAMIN W/MINERALS CH
1.0000 | ORAL_TABLET | Freq: Every day | ORAL | Status: DC
  Administered 2018-11-25 – 2018-11-26 (×2): 1 via ORAL
  Filled 2018-11-25 (×2): qty 1

## 2018-11-25 MED ORDER — NITROGLYCERIN 0.4 MG SL SUBL
0.4000 mg | SUBLINGUAL_TABLET | Freq: Once | SUBLINGUAL | Status: AC
  Administered 2018-11-25: 01:00:00 0.4 mg via SUBLINGUAL
  Filled 2018-11-25: qty 1

## 2018-11-25 MED ORDER — DILTIAZEM HCL ER COATED BEADS 240 MG PO CP24
240.0000 mg | ORAL_CAPSULE | Freq: Every day | ORAL | Status: DC
  Administered 2018-11-25 – 2018-11-26 (×2): 240 mg via ORAL
  Filled 2018-11-25 (×2): qty 1

## 2018-11-25 MED ORDER — ONDANSETRON HCL 4 MG PO TABS: 4.0000 mg | ORAL_TABLET | Freq: Four times a day (QID) | ORAL | Status: DC | PRN

## 2018-11-25 MED ORDER — ZOLPIDEM TARTRATE 10 MG PO TABS
10.0000 mg | ORAL_TABLET | Freq: Every day | ORAL | Status: DC
  Administered 2018-11-25: 10 mg via ORAL
  Filled 2018-11-25: qty 1

## 2018-11-25 MED ORDER — AMOXICILLIN-POT CLAVULANATE 875-125 MG PO TABS: 1.0000 | ORAL_TABLET | Freq: Two times a day (BID) | ORAL | Status: DC

## 2018-11-25 MED ORDER — AMOXICILLIN-POT CLAVULANATE 500-125 MG PO TABS
1.0000 | ORAL_TABLET | Freq: Two times a day (BID) | ORAL | Status: DC
  Administered 2018-11-25 – 2018-11-26 (×2): 500 mg via ORAL
  Filled 2018-11-25 (×2): qty 1

## 2018-11-25 MED ORDER — FOLIC ACID 1 MG PO TABS
1.0000 mg | ORAL_TABLET | Freq: Every day | ORAL | Status: DC
  Administered 2018-11-25 – 2018-11-26 (×2): 1 mg via ORAL
  Filled 2018-11-25 (×2): qty 1

## 2018-11-25 MED ORDER — LORAZEPAM 1 MG PO TABS
0.0000 mg | ORAL_TABLET | Freq: Four times a day (QID) | ORAL | Status: DC
  Administered 2018-11-26: 09:00:00 1 mg via ORAL
  Filled 2018-11-25 (×2): qty 1

## 2018-11-25 MED ORDER — ACETAMINOPHEN 325 MG PO TABS: 650.0000 mg | ORAL_TABLET | Freq: Four times a day (QID) | ORAL | Status: DC | PRN

## 2018-11-25 MED ORDER — SODIUM CHLORIDE 0.9 % IV SOLN
3.0000 g | Freq: Two times a day (BID) | INTRAVENOUS | Status: DC
  Administered 2018-11-25: 3 g via INTRAVENOUS
  Filled 2018-11-25: qty 3

## 2018-11-25 MED ORDER — SODIUM CHLORIDE 0.9 % IV SOLN
INTRAVENOUS | Status: DC
  Administered 2018-11-25: 22:00:00 via INTRAVENOUS

## 2018-11-25 MED ORDER — INSULIN ASPART 100 UNIT/ML ~~LOC~~ SOLN
0.0000 [IU] | Freq: Three times a day (TID) | SUBCUTANEOUS | Status: DC
  Administered 2018-11-25: 1 [IU] via SUBCUTANEOUS
  Administered 2018-11-26: 09:00:00 2 [IU] via SUBCUTANEOUS

## 2018-11-25 MED ORDER — FERROUS SULFATE 325 (65 FE) MG PO TABS
325.0000 mg | ORAL_TABLET | ORAL | Status: DC
  Administered 2018-11-26: 325 mg via ORAL
  Filled 2018-11-25: qty 1

## 2018-11-25 MED ORDER — CLONIDINE HCL 0.1 MG PO TABS
0.1000 mg | ORAL_TABLET | Freq: Two times a day (BID) | ORAL | Status: DC
  Administered 2018-11-25 – 2018-11-26 (×3): 0.1 mg via ORAL
  Filled 2018-11-25 (×3): qty 1

## 2018-11-25 MED ORDER — RIVAROXABAN 15 MG PO TABS
15.0000 mg | ORAL_TABLET | Freq: Every day | ORAL | Status: DC
  Administered 2018-11-25: 15 mg via ORAL
  Filled 2018-11-25: qty 1

## 2018-11-25 MED ORDER — THIAMINE HCL 100 MG/ML IJ SOLN
100.0000 mg | Freq: Every day | INTRAMUSCULAR | Status: DC
  Filled 2018-11-25: qty 2

## 2018-11-25 MED ORDER — SODIUM CHLORIDE 0.9 % IV SOLN
Freq: Once | INTRAVENOUS | Status: AC
  Administered 2018-11-25: 02:00:00 via INTRAVENOUS

## 2018-11-25 NOTE — Progress Notes (Addendum)
Patient seen and examined at bedside, patient admitted after midnight, please see earlier detailed admission note by Rise Patience, MD. Briefly, patient presented shortness of breath with concern for a heart failure exacerbation. History of grade 2 diastolic heart failure from January Transthoracic Echocardiogram. On room air but symptomatic. Also has an AKI possible related to heart failure exacerbation. Patient states he has put on 10 lbs in the last few months but is unaware of recent weight gain. Patient states he has been taking his lasix at home. Repeat BMP in AM. Watch potassium, daily weights, in/out. Physical exam significant for diminished breath sounds only. Weight is up over 10 lbs since discharge back in January. Will repeat BMP this afternoon as he has been on IV fluids. Depending on trend, will decide if continuing IV fluids or switching to Lasix. Also recommend continuing antibiotics for wound. Would switch to Augmentin since patient is able to take PO.   Cordelia Poche, MD [facility] 11/25/2018, 10:07 AM

## 2018-11-25 NOTE — Care Management Obs Status (Signed)
[place] NOTIFICATION   Patient Details  Name: Douglas Ballard MRN: [id number] Date of Birth: Jun 22, 1951   Medicare Observation Status Notification Given:  Yes    MahabirJuliann Pulse, RN 11/25/2018, 2:47 PM

## 2018-11-25 NOTE — TOC Initial Note (Signed)
Transition of Care [facility]) - Initial/Assessment Note    Patient Details  Name: Douglas Ballard MRN: [id number] Date of Birth: 01/07/1951  [facility]) CM/SW Contact:    Dessa Phi, RN Phone Number: 11/25/2018, 2:59 PM  Clinical Narrative: CHF. Provided w/CHF [facility] brochure-patient will contact them directly if he chooses the service. No further CM needs.                  Expected Discharge Plan: Home/Self Care Barriers to Discharge: No Barriers Identified   Patient Goals and CMS Choice Patient states their goals for this hospitalization and ongoing recovery are:: (go home)      Expected Discharge Plan and Services Expected Discharge Plan: Home/Self Care       Living arrangements for the past 2 months: Single Family Home                          Prior Living Arrangements/Services Living arrangements for the past 2 months: Single Family Home Lives with:: Spouse Patient language and need for interpreter reviewed:: Yes Do you feel safe going back to the place where you live?: Yes      Need for Family Participation in Patient Care: No (Comment) Care giver support system in place?: Yes (comment)   Criminal Activity/Legal Involvement Pertinent to Current Situation/Hospitalization: No - Comment as needed  Activities of Daily Living Home Assistive Devices/Equipment: Eyeglasses ADL Screening (condition at time of admission) Patient's cognitive ability adequate to safely complete daily activities?: Yes Is the patient deaf or have difficulty hearing?: No Does the patient have difficulty seeing, even when wearing glasses/contacts?: No Does the patient have difficulty concentrating, remembering, or making decisions?: No Patient able to express need for assistance with ADLs?: Yes Does the patient have difficulty dressing or bathing?: No Independently performs ADLs?: Yes (appropriate for developmental age) Does the patient have difficulty walking or climbing  stairs?: No Weakness of Legs: None Weakness of Arms/Hands: None  Permission Sought/Granted Permission sought to share information with : Case Manager Permission granted to share information with : Yes, Verbal Permission Granted  Share Information with NAME: Tremane, [place]     Permission granted to share info w Relationship: spouse  Permission granted to share info w Contact Information: [phone]  Emotional Assessment Appearance:: Appears stated age Attitude/Demeanor/Rapport: Gracious Affect (typically observed): Accepting Orientation: : Oriented to Self, Oriented to Place, Oriented to  Time, Oriented to Situation Alcohol / Substance Use: Never Used Psych Involvement: No (comment)  Admission diagnosis:  SOB (shortness of breath) [L39.03] Acute diastolic CHF (congestive heart failure) (HCC) [I50.31] Patient Active Problem List   Diagnosis Date Noted  . Acute diastolic CHF (congestive heart failure) ([place]) 11/25/2018  . ARF (acute renal failure) ([place]) 11/25/2018  . Anemia due to chronic kidney disease 08/31/2018  . Stress 08/01/2018  . Type 2 diabetes mellitus with stage 3 chronic kidney disease, without long-term current use of insulin ([place]) 09/06/2017  . CKD (chronic kidney disease) stage 3, GFR 30-59 ml/min (HCC) 02/07/2016  . Insomnia 09/19/2015  . Coarse tremors 09/19/2015  . Chronic diastolic CHF (congestive heart failure) ([place]) 11/01/2014  . History of adenomatous polyp of colon 06/14/2014  . Paroxysmal atrial fibrillation ([place]) 05/19/2014  . Elevated bilirubin 04/14/2014  . 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 Alcohol use disorder, moderate, dependence ([place]) 06/27/2011  . Hypertension associated with diabetes ([place]) 06/26/2011  . Personal history of prostate cancer s/p prostatectomy 2012 06/26/2011   PCP:  Vivi Barrack, MD Pharmacy:   [facility] [place] [facility] [address] [place] [address] Phone: [phone] Fax: [phone]  [id number] [place] [place] [address] [place] [address] Phone: [phone] Fax: [phone]     Social Determinants of Health (SDOH) Interventions    Readmission Risk Interventions No flowsheet data found.

## 2018-11-25 NOTE — H&P (Signed)
History and Physical    Douglas Ballard KZS:[id number] DOB: 26-Apr-1951 DOA: 11/25/2018  PCP: Vivi Barrack, MD  Patient coming from: Home.  Chief Complaint: Shortness of breath nausea vomiting.  HPI: Douglas Ballard is a 68 y.o. male with history of diastolic CHF, atrial fibrillation, diabetes mellitus type 2, chronic anemia, chronic kidney disease stage III presents to the ER with complaint of having increasing exertional shortness of breath over the last 3 weeks with no associated fever chills productive cough or chest pain.  Last night while having his supper he had 3 episodes of back-to-back nausea with vomiting.  Denies any diarrhea.  Denies any recent travel. Patient states he was recently placed 2 days ago on Augmentin for dog bite on his left hand.  ED Course: In the ER patient was afebrile not hypoxic and chest x-ray does not show anything acute.  EKG shows A. fib rate around 47 bpm.  Labs revealed creatinine of 3.4 that was an increase of creatinine from his baseline of 2.3 in January 2020.  BNP was 409 troponin was negative hemoglobin was 9 platelets 214.  There is a small bite mark on the left hand which patient states was from the dog.  Patient was given fluids in the ER for acute renal failure.  UA is pending.  Patient is admitted for exertional shortness of breath nausea vomiting.  Patient is able to make a fist on the hand which had a dog bite.  Review of Systems: As per HPI, rest all negative.   Past Medical History:  Diagnosis Date  . Alcohol withdrawal seizure ([place]) 06/28/2011   Alcohol withdraw seizure prior to admission is suspected from history given by family & Post ictal appearance in the ED.   Marland Kitchen Anxiety   . Atrial fibrillation ([place])   . Cancer [facility]) 2012   Prostate surgery  . Chronic diastolic CHF (congestive heart failure) ([place]) 11/01/2014   Echo 8/15: Mild LVH, EF 50-55%, moderate BAE  //  b. Echo 7/17: EF 55-60%, normal wall motion, grade 2 diastolic dysfunction,  MAC, mild MR, moderate LAE, trivial PI  . Compression fracture of thoracic vertebra ([place]) 06/26/2011  . Diabetes mellitus type 2 in nonobese (HCC)   . Fistula, bladder   . Frequent urination at night   . History of adenomatous polyp of colon 06/14/2014  . History of nuclear stress test    a. Myoview 10/15: Overall Impression: Low risk stress nuclear study demonstrating mild baseline ST-T changes with normal myocardial perfusion and low normal EF of 50%. // b.Myoview 7/17: EF 50%, no ischemia or scar, low risk (EF normal by recent echo)  . Hypertension   . PNA (pneumonia) 06/26/2011  . Sepsis due to urinary tract infection ([place]) 04/16/2014  . Stroke ([place]) sept 1, 2015   tia x 3    Past Surgical History:  Procedure Laterality Date  . COLON SURGERY    . COLONOSCOPY N/A 06/14/2014   Procedure: COLONOSCOPY;  Surgeon: Gatha Mayer, MD;  Location: [facility];  Service: Endoscopy;  Laterality: N/A;  . CYSTOSCOPY WITH STENT PLACEMENT Bilateral 06/15/2014   Procedure: CYSTOSCOPY WITH BILATERAL STENT PLACEMENT;  Surgeon: Bernestine Amass, MD;  Location: [facility];  Service: Urology;  Laterality: Bilateral;  . LIPOMA EXCISION  2012   Dr Nonah Mattes  . moles removed     Back  . PROCTOSCOPY N/A 06/15/2014   Procedure: RIGID PROCTOSCOPY;  Surgeon: Michael Boston, MD;  Location: [facility];  Service: General;  Laterality: N/A;  . RADIOLOGY WITH ANESTHESIA N/A 07/28/2014   Procedure: Embolization;  Surgeon: Luanne Bras, MD;  Location: [place];  Service: Radiology;  Laterality: N/A;  . ROBOT ASSISTED LAPAROSCOPIC RADICAL PROSTATECTOMY  01/31/2011   Robotic-assisted laparoscopic radical retropubic     reports that he quit smoking about 4 years ago. His smoking use included cigarettes. He has a 50.00 pack-year smoking history. He has never used smokeless tobacco. He reports current alcohol use of about 7.0 - 10.0 standard drinks of alcohol per week. He reports that he does not use drugs.  Allergies   Allergen Reactions  . Hydrochlorothiazide Other (See Comments)    Pt gets hyponatremia  . Lasix [Furosemide] Other (See Comments)    Sodium levels drop when take  . Nsaids Other (See Comments)    Kidney disease/failure  . Sulfa Antibiotics Other (See Comments)    headaches    Family History  Problem Relation Age of Onset  . Atrial fibrillation Father        onset 55s. Had pacemaker placed for sinus pause  . Prostate cancer Father   . Breast cancer Mother   . Lung cancer Mother   . Leukemia Brother   . Colon polyps Brother   . Colon cancer Neg Hx   . Diabetes Neg Hx     Prior to Admission medications   Medication Sig Start Date End Date Taking? Authorizing Provider  amoxicillin-clavulanate (AUGMENTIN) 875-125 MG tablet Take 1 tablet by mouth 2 (two) times daily. 11/24/18  Yes Vivi Barrack, MD  cloNIDine (CATAPRES) 0.1 MG tablet Take 0.1 mg by mouth 2 (two) times daily.  06/26/17  Yes [provider]  diltiazem (CARDIZEM CD) 240 MG 24 hr capsule TAKE ONE CAPSULE BY MOUTH EVERY MORNING Patient taking differently: Take 240 mg by mouth daily.  01/17/18  Yes Vivi Barrack, MD  DM-Doxylamine-Acetaminophen (NYQUIL HBP COLD & FLU) 15-6.25-325 MG/15ML LIQD Take 30 mLs by mouth at bedtime as needed (cough/sleep).   Yes [provider]  ferrous sulfate 325 (65 FE) MG tablet TAKE 1 TABLET BY MOUTH EVERY DAY Patient taking differently: Take 325 mg by mouth every Wednesday.  11/21/18  Yes Vivi Barrack, MD  furosemide (LASIX) 40 MG tablet TAKE 1 TABLET DAILY Patient taking differently: Take 40 mg by mouth daily.  11/10/18  Yes Vivi Barrack, MD  isosorbide-hydrALAZINE (BIDIL) 20-37.5 MG tablet Take 2 tablets by mouth 2 (two) times daily.    Yes [provider]  JANUVIA 100 MG tablet TAKE 1 TABLET BY MOUTH EVERY DAY Patient taking differently: Take 100 mg by mouth daily.  07/28/18  Yes Vivi Barrack, MD  labetalol (NORMODYNE) 200 MG tablet Take 2 tablets (400 mg  total) by mouth 3 (three) times daily. Patient taking differently: Take 400 mg by mouth 2 (two) times daily.  02/22/17  Yes [place], Peter M, MD  Vitamin D, Ergocalciferol, (DRISDOL) 1.25 MG (50000 UT) CAPS capsule Take 50,000 Units by mouth every Wednesday.   Yes [provider]  XARELTO 15 MG TABS tablet TAKE 1 TABLET (15 MG TOTAL) BY MOUTH DAILY WITH SUPPER. Patient taking differently: Take 15 mg by mouth daily.  09/11/18  Yes [place], Peter M, MD  zolpidem (AMBIEN) 10 MG tablet TAKE 1 TABLET (10 MG TOTAL) BY MOUTH AT BEDTIME AS NEEDED. FOR SLEEP Patient taking differently: Take 10 mg by mouth at bedtime.  08/01/18  Yes Vivi Barrack, MD  hydrOXYzine (ATARAX/VISTARIL) 50 MG tablet TAKE  1/2-2 TABLETS (25-100 MG TOTAL) BY MOUTH AT BEDTIME AS NEEDED FOR ANXIETY (INSOMNIA). Patient not taking: Reported on 11/25/2018 11/04/18   Vivi Barrack, MD    Physical Exam: Vitals:   11/25/18 0039 11/25/18 0041 11/25/18 0200 11/25/18 0222  BP:  (!) 142/83 (!) 155/68 (!) 151/76  Pulse:  68 (!) 58 (!) 55  Resp:  _0 Temp: (!) 97.5 F (36.4 C) (!) 97.5 F (36.4 C)    TempSrc: Oral Oral    SpO2:  97% 93% 93%  Weight: 81.6 kg     Height: _1  (1.753 m)         Constitutional: Moderately built and nourished. Vitals:   11/25/18 0039 11/25/18 0041 11/25/18 0200 11/25/18 0222  BP:  (!) 142/83 (!) 155/68 (!) 151/76  Pulse:  68 (!) 58 (!) 55  Resp:  _2 Temp: (!) 97.5 F (36.4 C) (!) 97.5 F (36.4 C)    TempSrc: Oral Oral    SpO2:  97% 93% 93%  Weight: 81.6 kg     Height: _3  (1.753 m)      Eyes: Anicteric no pallor. ENMT: No discharge from the ears eyes nose and mouth. Neck: No mass felt.  No neck rigidity no JVD appreciated. Respiratory: No rhonchi or crepitations. Cardiovascular: S1-S2 heard. Abdomen: Soft nontender bowel sounds present. Musculoskeletal: No edema.  Patient is able to make a fist on the hand which had a dog bite. Skin: Bite mark on his left hand from  the dog bite. Neurologic: Alert awake oriented to time place and person.  Moves all extremities. Psychiatric: Appears normal per normal affect.   Labs on Admission: I have personally reviewed following labs and imaging studies  CBC: Recent Labs  Lab 11/25/18 0127  WBC 7.9  NEUTROABS 6.2  HGB 9.0*  HCT 26.8*  MCV 98.2  PLT 950   Basic Metabolic Panel: Recent Labs  Lab 11/25/18 0127  NA 128*  K 3.9  CL 96*  CO2 20*  GLUCOSE 187*  BUN 50*  CREATININE 3.47*  CALCIUM 8.6*   GFR: Estimated Creatinine Clearance: 20.7 mL/min (A) (by C-G formula based on SCr of 3.47 mg/dL (H)). Liver Function Tests: No results for input(s): AST, ALT, ALKPHOS, BILITOT, PROT, ALBUMIN in the last 168 hours. No results for input(s): LIPASE, AMYLASE in the last 168 hours. No results for input(s): AMMONIA in the last 168 hours. Coagulation Profile: No results for input(s): INR, PROTIME in the last 168 hours. Cardiac Enzymes: Recent Labs  Lab 11/25/18 0127  TROPONINI <0.03   BNP (last 3 results) No results for input(s): PROBNP in the last 8760 hours. HbA1C: No results for input(s): HGBA1C in the last 72 hours. CBG: No results for input(s): GLUCAP in the last 168 hours. Lipid Profile: No results for input(s): CHOL, HDL, LDLCALC, TRIG, CHOLHDL, LDLDIRECT in the last 72 hours. Thyroid Function Tests: No results for input(s): TSH, T4TOTAL, FREET4, T3FREE, THYROIDAB in the last 72 hours. Anemia Panel: No results for input(s): VITAMINB12, FOLATE, FERRITIN, TIBC, IRON, RETICCTPCT in the last 72 hours. Urine analysis:    Component Value Date/Time   COLORURINE YELLOW 02/06/2016 0345   APPEARANCEUR CLEAR 02/06/2016 0345   LABSPEC 1.014 02/06/2016 0345   PHURINE 6.0 02/06/2016 0345   GLUCOSEU NEGATIVE 02/06/2016 0345   HGBUR MODERATE (A) 02/06/2016 0345   BILIRUBINUR NEGATIVE 02/06/2016 0345   KETONESUR NEGATIVE 02/06/2016 0345   PROTEINUR 100 (A) 02/06/2016 0345   UROBILINOGEN 1.0  04/14/2014 0759   NITRITE NEGATIVE 02/06/2016 0345   LEUKOCYTESUR NEGATIVE 02/06/2016 0345   Sepsis Labs: _0 (procalcitonin:4,lacticidven:4) )No results found for this or any previous visit (from the past 240 hour(s)).   Radiological Exams on Admission: Dg Chest Port 1 View  Result Date: 11/25/2018 CLINICAL DATA:  Shortness of breath. Mid chest pain. EXAM: PORTABLE CHEST 1 VIEW COMPARISON:  Radiograph 08/31/2018 FINDINGS: Cardiomegaly appears similar to prior exam. Pulmonary edema and pleural effusions have resolved. No acute airspace disease. No pneumothorax. No acute osseous abnormalities. Remote left rib fracture. IMPRESSION: Stable cardiomegaly. No acute chest findings. Previous pulmonary edema and pleural effusions have resolved. Electronically Signed   By: Keith Rake M.D.   On: 11/25/2018 01:16    EKG: Independently reviewed.  A. fib rate around 47 bpm.  Assessment/Plan Principal Problem:   Acute diastolic CHF (congestive heart failure) (HCC) Active Problems:   Personal history of prostate cancer s/p prostatectomy 2012   Paroxysmal atrial fibrillation (HCC)   Type 2 diabetes mellitus with stage 3 chronic kidney disease, without long-term current use of insulin (HCC)   Anemia due to chronic kidney disease   ARF (acute renal failure) ([place])    1. Exertional shortness of breath -cause not clear.  Does not look fluid overloaded clinically.  We will cycle cardiac markers check d-dimer I will get a CT chest without contrast.  If d-dimer is elevated we may have to rule out PE.  Patient is not sure he was taking his Lasix or not. 2. Acute on chronic kidney disease stage III creatinine increased from baseline of 2.3 in January 2020 two 3.4 at this time.  Was given fluids in the ER.  Patient is not sure if he was taking his Lasix.  In any case we will hold it for now.  Closely follow intake output metabolic panel and UA is pending.  Check urine studies. 3. Nausea vomiting -denies  any abdominal pain.  Check LFTs lipase.  Not sure if it is related to patient taking his antibiotics for his dog bite.  Closely observe. 4. Dog bite was on the left hand for which patient is on antibiotics.  I discussed with pharmacy. 5. Approximately fibrillation on Xarelto.  Needs to be closely followed since patient's kidney functions are worsening.  Rate controlled at this time on labetalol and Cardizem.  I have placed holding parameters.  Heart rate around 47 and EKG.  We will need to closely monitor the telemetry. 6. Hypertension on clonidine Cardizem labetalol BiDil. 7. Chronic anemia on iron supplements.  Hemoglobin is at baseline. 8. Alcoholism is mention in the chart today when I questioned patient patient states that he only drinks in a week and will closely observe.   DVT prophylaxis: Xarelto. Code Status: Full code. Family Communication: Discussed with patient. Disposition Plan: Home. Consults called: None. Admission status: Observation.   Rise Patience MD [facility] Pager [phone].  If 7PM-7AM, please contact night-coverage [email or web address] Password [facility]  11/25/2018, 2:53 AM

## 2018-11-25 NOTE — ED Notes (Signed)
XR at bedside

## 2018-11-25 NOTE — Progress Notes (Signed)
Pharmacy Antibiotic Note  Douglas Ballard is a 68 y.o. male admitted on 11/25/2018 with dog bite.  Pharmacy has been consulted for unasyn dosing.  Plan: Unasyn 3 gm IV q12  Height: 5\' 9"  (175.3 cm) Weight: 186 lb 12.8 oz (84.7 kg) IBW/kg (Calculated) : 70.7  Temp (24hrs), Avg:97.7 F (36.5 C), Min:97.5 F (36.4 C), Max:98 F (36.7 C)  Recent Labs  Lab 11/25/18 0127  WBC 7.9  CREATININE 3.47*    Estimated Creatinine Clearance: 20.7 mL/min (A) (by C-G formula based on SCr of 3.47 mg/dL (H)).    Allergies  Allergen Reactions  . Hydrochlorothiazide Other (See Comments)    Pt gets hyponatremia  . Lasix [Furosemide] Other (See Comments)    Sodium levels drop when take  . Nsaids Other (See Comments)    Kidney disease/failure  . Sulfa Antibiotics Other (See Comments)    headaches    Antimicrobials this admission 4/7 Unasyn>>  Dose adjustments this admission:  Microbiology results: None ordered  Eudelia Bunch, Pharm.D [phone] 11/25/2018 5:04 AM

## 2018-11-25 NOTE — ED Notes (Signed)
ED TO INPATIENT HANDOFF REPORT  Name/Age/Gender Douglas Ballard 68 y.o. male  Code Status    Code Status Orders  (From admission, onward)         Start     Ordered   11/25/18 0252  Full code  Continuous     11/25/18 0252        Code Status History    Date Active Date Inactive Code Status Order ID Comments User Context   08/31/2018 1237 09/02/2018 1605 Full Code [id number]  Marcell Anger, MD Inpatient   07/28/2014 1107 07/29/2014 0339 Full Code [id number]  Luanne Bras, MD HOV   06/15/2014 1352 06/20/2014 1545 Full Code [id number]  Michael Boston, MD Inpatient   04/14/2014 0931 04/23/2014 1731 Full Code [id number]  Caren Griffins, MD ED   06/26/2011 1355 07/02/2011 1250 Full Code [id number]  Verlee Monte, MD ED    Advance Directive Documentation     Most Recent Value  Type of Advance Directive  Healthcare Power of Attorney  Pre-existing out of facility DNR order (yellow form or pink MOST form)  -  "MOST" Form in Place?  -      Home/SNF/Other Home  Chief Complaint shortness of breath   Level of Care/Admitting Diagnosis ED Disposition    ED Disposition Condition [place]: [place] [100102]  Level of Care: Telemetry [5]  Admit to tele based on following criteria: Acute CHF  Diagnosis: Acute diastolic CHF (congestive heart failure) [facility]) [638466]  Admitting Physician: Rise Patience [phone]  Attending Physician: Rise Patience [email or web address]  PT Class (Do Not Modify): Observation [104]  PT Acc Code (Do Not Modify): Observation [10022]       Medical History Past Medical History:  Diagnosis Date  . Alcohol withdrawal seizure ([place]) 06/28/2011   Alcohol withdraw seizure prior to admission is suspected from history given by family & Post ictal appearance in the ED.   Marland Kitchen Anxiety   . Atrial fibrillation ([place])   . Cancer [facility]) 2012   Prostate surgery  . Chronic diastolic CHF (congestive heart failure) ([place])  11/01/2014   Echo 8/15: Mild LVH, EF 50-55%, moderate BAE  //  b. Echo 7/17: EF 55-60%, normal wall motion, grade 2 diastolic dysfunction, MAC, mild MR, moderate LAE, trivial PI  . Compression fracture of thoracic vertebra ([place]) 06/26/2011  . Diabetes mellitus type 2 in nonobese (HCC)   . Fistula, bladder   . Frequent urination at night   . History of adenomatous polyp of colon 06/14/2014  . History of nuclear stress test    a. Myoview 10/15: Overall Impression: Low risk stress nuclear study demonstrating mild baseline ST-T changes with normal myocardial perfusion and low normal EF of 50%. // b.Myoview 7/17: EF 50%, no ischemia or scar, low risk (EF normal by recent echo)  . Hypertension   . PNA (pneumonia) 06/26/2011  . Sepsis due to urinary tract infection ([place]) 04/16/2014  . Stroke ([place]) sept 1, 2015   tia x 3    Allergies Allergies  Allergen Reactions  . Hydrochlorothiazide Other (See Comments)    Pt gets hyponatremia  . Lasix [Furosemide] Other (See Comments)    Sodium levels drop when take  . Nsaids Other (See Comments)    Kidney disease/failure  . Sulfa Antibiotics Other (See Comments)    headaches    IV Location/Drains/Wounds Patient Lines/Drains/Airways Status   Active Line/Drains/Airways    Name:   Placement date:   Placement time:  Site:   Days:   Peripheral IV 11/25/18 Right Antecubital   11/25/18    0140    Antecubital   less than 1          Labs/Imaging Results for orders placed or performed during the hospital encounter of 11/25/18 (from the past 48 hour(s))  CBC with Differential     Status: Abnormal   Collection Time: 11/25/18  1:27 AM  Result Value Ref Range   WBC 7.9 4.0 - 10.5 K/uL   RBC 2.73 (L) 4.22 - 5.81 MIL/uL   Hemoglobin 9.0 (L) 13.0 - 17.0 g/dL   HCT 26.8 (L) 39.0 - 52.0 %   MCV 98.2 80.0 - 100.0 fL   MCH 33.0 26.0 - 34.0 pg   MCHC 33.6 30.0 - 36.0 g/dL   RDW 12.6 11.5 - 15.5 %   Platelets 214 150 - 400 K/uL   nRBC 0.0 0.0 - 0.2 %    Neutrophils Relative % 80 %   Neutro Abs 6.2 1.7 - 7.7 K/uL   Lymphocytes Relative 7 %   Lymphs Abs 0.6 (L) 0.7 - 4.0 K/uL   Monocytes Relative 11 %   Monocytes Absolute 0.9 0.1 - 1.0 K/uL   Eosinophils Relative 2 %   Eosinophils Absolute 0.1 0.0 - 0.5 K/uL   Basophils Relative 0 %   Basophils Absolute 0.0 0.0 - 0.1 K/uL   Immature Granulocytes 0 %   Abs Immature Granulocytes 0.02 0.00 - 0.07 K/uL    Comment: Performed at [facility], [place] [address], [place], [place] [address]  Basic metabolic panel     Status: Abnormal   Collection Time: 11/25/18  1:27 AM  Result Value Ref Range   Sodium 128 (L) 135 - 145 mmol/L   Potassium 3.9 3.5 - 5.1 mmol/L   Chloride 96 (L) 98 - 111 mmol/L   CO2 20 (L) 22 - 32 mmol/L   Glucose, Bld 187 (H) 70 - 99 mg/dL   BUN 50 (H) 8 - 23 mg/dL   Creatinine, Ser 3.47 (H) 0.61 - 1.24 mg/dL   Calcium 8.6 (L) 8.9 - 10.3 mg/dL   GFR calc non Af Amer 17 (L) >60 mL/min   GFR calc Af Amer 20 (L) >60 mL/min   Anion gap 12 5 - 15    Comment: Performed at [facility], [place] [address], [place], [place] [address]  Brain natriuretic peptide     Status: Abnormal   Collection Time: 11/25/18  1:27 AM  Result Value Ref Range   B Natriuretic Peptide 409.1 (H) 0.0 - 100.0 pg/mL    Comment: Performed at [facility], [place] [address] [place], [place] [address]  Troponin I - ONCE - STAT     Status: None   Collection Time: 11/25/18  1:27 AM  Result Value Ref Range   Troponin I <0.03 <0.03 ng/mL    Comment: Performed at [facility], [place] [address] [place], [place] [address]   Dg Chest Port 1 View  Result Date: 11/25/2018 CLINICAL DATA:  Shortness of breath. Mid chest pain. EXAM: PORTABLE CHEST 1 VIEW COMPARISON:  Radiograph 08/31/2018 FINDINGS: Cardiomegaly appears similar to prior exam. Pulmonary edema and pleural effusions have resolved. No acute airspace disease. No pneumothorax. No acute osseous  abnormalities. Remote left rib fracture. IMPRESSION: Stable cardiomegaly. No acute chest findings. Previous pulmonary edema and pleural effusions have resolved. Electronically Signed   By: Keith Rake M.D.   On: 11/25/2018  01:16    Pending Labs Unresulted Labs (From admission, onward)    Start     Ordered   11/25/18 5003  Basic metabolic panel  Tomorrow morning,   R     11/25/18 0252   11/25/18 0500  CBC WITH DIFFERENTIAL  Tomorrow morning,   R     11/25/18 0252   11/25/18 0253  Troponin I - Now Then Q6H  Now then every 6 hours,   R     11/25/18 0252   11/25/18 0253  Lipase, blood  Once,   R     11/25/18 0252   11/25/18 0252  Hepatic function panel  ONCE - STAT,   R     11/25/18 0252          Vitals/Pain Today's Vitals   11/25/18 0039 11/25/18 0041 11/25/18 0200 11/25/18 0222  BP:  (!) 142/83 (!) 155/68 (!) 151/76  Pulse:  68 (!) 58 (!) 55  Resp:  _0 Temp: (!) 97.5 F (36.4 C) (!) 97.5 F (36.4 C)    TempSrc: Oral Oral    SpO2:  97% 93% 93%  Weight: 81.6 kg     Height: _1  (1.753 m)     PainSc: 0-No pain       Isolation Precautions No active isolations  Medications Medications  cloNIDine (CATAPRES) tablet 0.1 mg (has no administration in time range)  diltiazem (CARDIZEM CD) 24 hr capsule 240 mg (has no administration in time range)  isosorbide-hydrALAZINE (BIDIL) 20-37.5 MG per tablet 2 tablet (has no administration in time range)  labetalol (NORMODYNE) tablet 400 mg (has no administration in time range)  zolpidem (AMBIEN) tablet 10 mg (has no administration in time range)  ferrous sulfate tablet 325 mg (has no administration in time range)  Rivaroxaban (XARELTO) tablet 15 mg (has no administration in time range)  acetaminophen (TYLENOL) tablet 650 mg (has no administration in time range)    Or  acetaminophen (TYLENOL) suppository 650 mg (has no administration in time range)  ondansetron (ZOFRAN) tablet 4 mg (has no administration in time range)     Or  ondansetron (ZOFRAN) injection 4 mg (has no administration in time range)  insulin aspart (novoLOG) injection 0-9 Units (has no administration in time range)  ondansetron (ZOFRAN) injection 4 mg (4 mg Intravenous Given 11/25/18 0136)  nitroGLYCERIN (NITROSTAT) SL tablet 0.4 mg (0.4 mg Sublingual Given 11/25/18 0127)  0.9 %  sodium chloride infusion ( Intravenous New Bag/Given 11/25/18 0225)    Mobility walks

## 2018-11-25 NOTE — ED Notes (Signed)
Report given to [facility] 4E 03 Eulis Canner, Therapist, sports.

## 2018-11-25 NOTE — ED Provider Notes (Signed)
[facility] DEPT Provider Note   CSN: [id number] Arrival date & time: 11/25/18  0031    History   Chief Complaint Chief Complaint  Patient presents with   Shortness of Breath    HPI Douglas Ballard is a 68 y.o. male.  HPI: A 68 year old patient with a history of treated diabetes and hypertension presents for evaluation of chest pain. Initial onset of pain was more than 6 hours ago. The patient's chest pain is described as heaviness/pressure/tightness and is worse with exertion. The patient complains of nausea. The patient's chest pain is not middle- or left-sided, is not well-localized, is not sharp and does not radiate to the arms/jaw/neck. The patient denies diaphoresis. The patient has no history of stroke, has no history of peripheral artery disease, has not smoked in the past 90 days, has no relevant family history of coronary artery disease (first degree relative at less than age 41), has no history of hypercholesterolemia and does not have an elevated BMI (>=30).   68 year old male with significant past medical history for dCHF, CKD stage III, HTN, DM, and PAF on chronic Xarelto presents to the emergency department for evaluation of shortness of breath.  He reports progressive shortness of breath over the past 3 days, worse with exertion such as when taking out his trash and walking back to the house, or when walking up stairs in his home.  Symptoms also aggravated when lying flat.  He noticed further worsening tonight beginning at 1800.  Shortly after dinner, the patient reports being overwhelmed by a pressure-like discomfort across his diaphragm with increased shortness of breath.  He has felt slight discomfort in the middle of his back as well, but attributes this to poor positioning when lying on the couch.  Patient increasingly nauseous with one episode of vomiting in the emergency department.  Endorses feeling clammy at this time.  He has had a 9 pound  weight gain over the past 3 months, since hospital discharge.  Reports compliance with his daily medications.  No fever, syncope, jaw pain, abdominal pain, extremity numbness or weakness, cough, sick contacts.  He is unsure of leg swelling.   Shortness of Breath    Past Medical History:  Diagnosis Date   Alcohol withdrawal seizure ([place]) 06/28/2011   Alcohol withdraw seizure prior to admission is suspected from history given by family & Post ictal appearance in the ED.    Anxiety    Atrial fibrillation ([place])    Cancer ([place]) 2012   Prostate surgery   Chronic diastolic CHF (congestive heart failure) ([place]) 11/01/2014   Echo 8/15: Mild LVH, EF 50-55%, moderate BAE  //  b. Echo 7/17: EF 55-60%, normal wall motion, grade 2 diastolic dysfunction, MAC, mild MR, moderate LAE, trivial PI   Compression fracture of thoracic vertebra ([place]) 06/26/2011   Diabetes mellitus type 2 in nonobese (HCC)    Fistula, bladder    Frequent urination at night    History of adenomatous polyp of colon 06/14/2014   History of nuclear stress test    a. Myoview 10/15: Overall Impression: Low risk stress nuclear study demonstrating mild baseline ST-T changes with normal myocardial perfusion and low normal EF of 50%. // b.Myoview 7/17: EF 50%, no ischemia or scar, low risk (EF normal by recent echo)   Hypertension    PNA (pneumonia) 06/26/2011   Sepsis due to urinary tract infection ([place]) 04/16/2014   Stroke ([place]) sept 1, 2015   tia x 3  Patient Active Problem List   Diagnosis Date Noted   Acute diastolic CHF (congestive heart failure) ([place]) 11/25/2018   ARF (acute renal failure) ([place]) 11/25/2018   Anemia due to chronic kidney disease 08/31/2018   Stress 08/01/2018   Type 2 diabetes mellitus with stage 3 chronic kidney disease, without long-term current use of insulin ([place]) 09/06/2017   CKD (chronic kidney disease) stage 3, GFR 30-59 ml/min (HCC) 02/07/2016   Insomnia 09/19/2015   Coarse  tremors 09/19/2015   Chronic diastolic CHF (congestive heart failure) ([place]) 11/01/2014   History of adenomatous polyp of colon 06/14/2014   Paroxysmal atrial fibrillation ([place]) 05/19/2014   Elevated bilirubin 04/14/2014   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   Alcohol use disorder, moderate, dependence ([place]) 06/27/2011   Hypertension associated with diabetes ([place]) 06/26/2011   Personal history of prostate cancer s/p prostatectomy 2012 06/26/2011    Past Surgical History:  Procedure Laterality Date   COLON SURGERY     COLONOSCOPY N/A 06/14/2014   Procedure: COLONOSCOPY;  Surgeon: Gatha Mayer, MD;  Location: [facility];  Service: Endoscopy;  Laterality: N/A;   CYSTOSCOPY WITH STENT PLACEMENT Bilateral 06/15/2014   Procedure: CYSTOSCOPY WITH BILATERAL STENT PLACEMENT;  Surgeon: Bernestine Amass, MD;  Location: [facility] ORS;  Service: Urology;  Laterality: Bilateral;   LIPOMA EXCISION  2012   Dr Nonah Mattes   moles removed     Back   PROCTOSCOPY N/A 06/15/2014   Procedure: RIGID PROCTOSCOPY;  Surgeon: Michael Boston, MD;  Location: [facility];  Service: General;  Laterality: N/A;   RADIOLOGY WITH ANESTHESIA N/A 07/28/2014   Procedure: Embolization;  Surgeon: Luanne Bras, MD;  Location: [facility];  Service: Radiology;  Laterality: N/A;   ROBOT ASSISTED LAPAROSCOPIC RADICAL PROSTATECTOMY  01/31/2011   Robotic-assisted laparoscopic radical retropubic        Home Medications    Prior to Admission medications   Medication Sig Start Date End Date Taking? Authorizing Provider  amoxicillin-clavulanate (AUGMENTIN) 875-125 MG tablet Take 1 tablet by mouth 2 (two) times daily. 11/24/18  Yes Vivi Barrack, MD  cloNIDine (CATAPRES) 0.1 MG tablet Take 0.1 mg by mouth 2 (two) times daily.  06/26/17  Yes [provider]  diltiazem (CARDIZEM CD) 240 MG 24 hr capsule TAKE ONE CAPSULE BY MOUTH EVERY MORNING Patient taking differently: Take 240 mg by mouth daily.   01/17/18  Yes Vivi Barrack, MD  DM-Doxylamine-Acetaminophen (NYQUIL HBP COLD & FLU) 15-6.25-325 MG/15ML LIQD Take 30 mLs by mouth at bedtime as needed (cough/sleep).   Yes [provider]  ferrous sulfate 325 (65 FE) MG tablet TAKE 1 TABLET BY MOUTH EVERY DAY Patient taking differently: Take 325 mg by mouth every Wednesday.  11/21/18  Yes Vivi Barrack, MD  furosemide (LASIX) 40 MG tablet TAKE 1 TABLET DAILY Patient taking differently: Take 40 mg by mouth daily.  11/10/18  Yes Vivi Barrack, MD  isosorbide-hydrALAZINE (BIDIL) 20-37.5 MG tablet Take 2 tablets by mouth 2 (two) times daily.    Yes [provider]  JANUVIA 100 MG tablet TAKE 1 TABLET BY MOUTH EVERY DAY Patient taking differently: Take 100 mg by mouth daily.  07/28/18  Yes Vivi Barrack, MD  labetalol (NORMODYNE) 200 MG tablet Take 2 tablets (400 mg total) by mouth 3 (three) times daily. Patient taking differently: Take 400 mg by mouth 2 (two) times daily.  02/22/17  Yes [place], Peter M, MD  Vitamin D, Ergocalciferol, (DRISDOL) 1.25 MG (50000 UT) CAPS capsule  Take 50,000 Units by mouth every Wednesday.   Yes [provider]  XARELTO 15 MG TABS tablet TAKE 1 TABLET (15 MG TOTAL) BY MOUTH DAILY WITH SUPPER. Patient taking differently: Take 15 mg by mouth daily.  09/11/18  Yes [place], Peter M, MD  zolpidem (AMBIEN) 10 MG tablet TAKE 1 TABLET (10 MG TOTAL) BY MOUTH AT BEDTIME AS NEEDED. FOR SLEEP Patient taking differently: Take 10 mg by mouth at bedtime.  08/01/18  Yes Vivi Barrack, MD  hydrOXYzine (ATARAX/VISTARIL) 50 MG tablet TAKE 1/2-2 TABLETS (25-100 MG TOTAL) BY MOUTH AT BEDTIME AS NEEDED FOR ANXIETY (INSOMNIA). Patient not taking: Reported on 11/25/2018 11/04/18   Vivi Barrack, MD    Family History Family History  Problem Relation Age of Onset   Atrial fibrillation Father        onset 74s. Had pacemaker placed for sinus pause   Prostate cancer Father    Breast cancer Mother    Lung cancer  Mother    Leukemia Brother    Colon polyps Brother    Colon cancer Neg Hx    Diabetes Neg Hx     Social History Social History   Tobacco Use   Smoking status: Former Smoker    Packs/day: 2.00    Years: 25.00    Pack years: 50.00    Types: Cigarettes    Last attempt to quit: 04/14/2014    Years since quitting: 4.6   Smokeless tobacco: Never Used  Substance Use Topics   Alcohol use: Yes    Alcohol/week: 7.0 - 10.0 standard drinks    Types: 7 - 10 Standard drinks or equivalent per week    Comment: 2 bottles of wine with wife a week; cocktail every other night   Drug use: No     Allergies   Hydrochlorothiazide; Lasix [furosemide]; Nsaids; and Sulfa antibiotics   Review of Systems Review of Systems  Respiratory: Positive for shortness of breath.   Ten systems reviewed and are negative for acute change, except as noted in the HPI.     Physical Exam Updated Vital Signs BP (!) 163/86    Pulse 70    Temp (!) 97.5 F (36.4 C) (Oral)    Resp 13    Ht _0  (1.753 m)    Wt 81.6 kg    SpO2 95%    BMI 26.58 kg/m   Physical Exam Vitals signs and nursing note reviewed.  Constitutional:      General: He is not in acute distress.    Appearance: He is well-developed. He is not diaphoretic.     Comments: Pale complexion. Appears uncomfortable.  HENT:     Head: Normocephalic and atraumatic.  Eyes:     General: No scleral icterus.    Conjunctiva/sclera: Conjunctivae normal.  Neck:     Musculoskeletal: Normal range of motion.  Cardiovascular:     Pulses: Normal pulses.     Comments: Intermittent bradycardia.  Rhythm is irregularly irregular. Pulmonary:     Effort: Pulmonary effort is normal. No respiratory distress.     Breath sounds: No wheezing or rales.     Comments: Grossly diminished. No respiratory distress. No W/R/R.  Musculoskeletal: Normal range of motion.     Right lower leg: Edema present.     Left lower leg: Edema present.     Comments: 2+ pitting edema  bilateral lower extremities.  Skin:    General: Skin is warm and dry.     Findings: No erythema  or rash.  Neurological:     Mental Status: He is alert and oriented to person, place, and time.     Coordination: Coordination normal.     Comments: Ambulatory with steady gait.  Moving all extremities.  Psychiatric:        Behavior: Behavior normal.      ED Treatments / Results  Labs (all labs ordered are listed, but only abnormal results are displayed) Labs Reviewed  CBC WITH DIFFERENTIAL/PLATELET - Abnormal; Notable for the following components:      Result Value   RBC 2.73 (*)    Hemoglobin 9.0 (*)    HCT 26.8 (*)    Lymphs Abs 0.6 (*)    All other components within normal limits  BASIC METABOLIC PANEL - Abnormal; Notable for the following components:   Sodium 128 (*)    Chloride 96 (*)    CO2 20 (*)    Glucose, Bld 187 (*)    BUN 50 (*)    Creatinine, Ser 3.47 (*)    Calcium 8.6 (*)    GFR calc non Af Amer 17 (*)    GFR calc Af Amer 20 (*)    All other components within normal limits  BRAIN NATRIURETIC PEPTIDE - Abnormal; Notable for the following components:   B Natriuretic Peptide 409.1 (*)    All other components within normal limits  TROPONIN I  BASIC METABOLIC PANEL  HEPATIC FUNCTION PANEL  CBC WITH DIFFERENTIAL/PLATELET  TROPONIN I  TROPONIN I  TROPONIN I  LIPASE, BLOOD    EKG EKG Interpretation  Date/Time:  Tuesday November 25 2018 00:45:18 EDT Ventricular Rate:  47 PR Interval:    QRS Duration: 102 QT Interval:  489 QTC Calculation: 433 R Axis:   -19 Text Interpretation:  Atrial fibrillation Ventricular premature complex Borderline left axis deviation Anterior infarct, old Borderline repolarization abnormality When compared to prior,  no significant changes seen.  No STEMI Confirmed by Antony Blackbird [id number]) on 11/25/2018 1:02:17 AM   Radiology Dg Chest Port 1 View  Result Date: 11/25/2018 CLINICAL DATA:  Shortness of breath. Mid chest pain. EXAM:  PORTABLE CHEST 1 VIEW COMPARISON:  Radiograph 08/31/2018 FINDINGS: Cardiomegaly appears similar to prior exam. Pulmonary edema and pleural effusions have resolved. No acute airspace disease. No pneumothorax. No acute osseous abnormalities. Remote left rib fracture. IMPRESSION: Stable cardiomegaly. No acute chest findings. Previous pulmonary edema and pleural effusions have resolved. Electronically Signed   By: Keith Rake M.D.   On: 11/25/2018 01:16     Transthoracic Echocardiography 08/31/2018 Patient:    Douglas Ballard, Douglas Ballard MR #:       [id number] Study Date: 08/31/2018 Gender:     M Age:        31 Height:     175.3 cm Weight:     83.2 kg BSA:        2.03 m^2 Pt. Status: Room:       1428  ---------------------------------------------------------------- LV EF: 55% -   60%  ------------------------------------------------------------------- Indications:      CHF - 428.0.  ------------------------------------------------------------------- History:   PMH:   Atrial fibrillation.  Stroke.  PMH:  Cancer. Risk factors:  Hypertension. Diabetes mellitus.  ------------------------------------------------------------------- Study Conclusions  - Left ventricle: The cavity size was normal. There was mild   concentric hypertrophy. Systolic function was normal. The   estimated ejection fraction was in the range of 55% to 60%. Wall   motion was normal; there were no regional wall motion   abnormalities. Features are  consistent with a pseudonormal left   ventricular filling pattern, with concomitant abnormal relaxation   and increased filling pressure (grade 2 diastolic dysfunction). - Aortic valve: There was trivial regurgitation. - Mitral valve: There was mild regurgitation. - Left atrium: The atrium was severely dilated. - Right atrium: The atrium was severely dilated. - Pulmonary arteries: PA peak pressure: 36 mm Hg (S). - Pericardium, extracardiac: A trivial pericardial effusion  was   identified.   Procedures Procedures (including critical care time)  Medications Ordered in ED Medications  cloNIDine (CATAPRES) tablet 0.1 mg (has no administration in time range)  diltiazem (CARDIZEM CD) 24 hr capsule 240 mg (has no administration in time range)  isosorbide-hydrALAZINE (BIDIL) 20-37.5 MG per tablet 2 tablet (has no administration in time range)  labetalol (NORMODYNE) tablet 400 mg (has no administration in time range)  zolpidem (AMBIEN) tablet 10 mg (has no administration in time range)  ferrous sulfate tablet 325 mg (has no administration in time range)  Rivaroxaban (XARELTO) tablet 15 mg (has no administration in time range)  acetaminophen (TYLENOL) tablet 650 mg (has no administration in time range)    Or  acetaminophen (TYLENOL) suppository 650 mg (has no administration in time range)  ondansetron (ZOFRAN) tablet 4 mg (has no administration in time range)    Or  ondansetron (ZOFRAN) injection 4 mg (has no administration in time range)  insulin aspart (novoLOG) injection 0-9 Units (has no administration in time range)  ondansetron (ZOFRAN) injection 4 mg (4 mg Intravenous Given 11/25/18 0136)  nitroGLYCERIN (NITROSTAT) SL tablet 0.4 mg (0.4 mg Sublingual Given 11/25/18 0127)  0.9 %  sodium chloride infusion ( Intravenous New Bag/Given 11/25/18 0225)     Initial Impression / Assessment and Plan / ED Course  I have reviewed the triage vital signs and the nursing notes.  Pertinent labs & imaging results that were available during my care of the patient were reviewed by me and considered in my medical decision making (see chart for details).     HEAR Score: 4  68 year old male presents to the emergency department for complaints of shortness of breath x3 days.  Symptoms aggravated with exertion such as when walking up a hill or up a flight of stairs.  He specifically notes worsening of his shortness of breath tonight.  This was associated with a burning,  pressure-like pain across his lower chest at the level of his diaphragm.  He was pale in appearance on my assessment and had one episode of vomiting in the ED.  Not overtly fluid overloaded on exam, but does have some pitting edema in bilateral lower extremities.  Patient's EKG today shows rate controlled atrial fibrillation.  He is chronically anticoagulated with Xarelto.  While his BNP is elevated suggestive of CHF exacerbation, his labs also suggest a degree of dehydration with new acute kidney injury.  He was started on a saline infusion for this reason.  Chest x-ray without significant vascular congestion.  No pleural effusions, pneumothorax, pneumonia.  Plan for admission for both hydration and diuresis.  The patient has a heart score of 5.  Given his chest discomfort prior to arrival, I believe he would also benefit from chest pain rule out with serial troponins.  Case discussed with Dr. Hal Hope of [facility] who will admit.   Final Clinical Impressions(s) / ED Diagnoses   Final diagnoses:  SOB (shortness of breath)    ED Discharge Orders    None       Antonietta Breach, PA-C 11/25/18  [place], Gwenyth Allegra, MD 11/25/18 1330

## 2018-11-25 NOTE — ED Triage Notes (Signed)
Pt reports having increasing shortness of breath over the last several days. Pt denies any cough or being in contact with persons DX with COVID-19

## 2018-11-26 DIAGNOSIS — I130 Hypertensive heart and chronic kidney disease with heart failure and stage 1 through stage 4 chronic kidney disease, or unspecified chronic kidney disease: Secondary | ICD-10-CM | POA: Diagnosis not present

## 2018-11-26 DIAGNOSIS — I5031 Acute diastolic (congestive) heart failure: Secondary | ICD-10-CM | POA: Diagnosis not present

## 2018-11-26 LAB — GLUCOSE, CAPILLARY: Glucose-Capillary: 169 mg/dL — ABNORMAL HIGH (ref 70–99)

## 2018-11-26 MED ORDER — LABETALOL HCL 200 MG PO TABS: 400.0000 mg | ORAL_TABLET | Freq: Two times a day (BID) | ORAL | Status: DC

## 2018-11-26 MED ORDER — DILTIAZEM HCL ER COATED BEADS 180 MG PO CP24: 180.0000 mg | ORAL_CAPSULE | Freq: Every morning | ORAL | 0 refills | Status: DC

## 2018-11-26 MED ORDER — ZOLPIDEM TARTRATE 5 MG PO TABS: 5.0000 mg | ORAL_TABLET | Freq: Every day | ORAL | Status: DC

## 2018-11-26 NOTE — Discharge Summary (Signed)
Physician Discharge Summary  Douglas Ballard NLZ:[id number] DOB: 1951/06/26 DOA: 11/25/2018  PCP: Vivi Barrack, MD  Admit date: 11/25/2018 Discharge date: 11/26/2018  Time spent: 40 minutes  Recommendations for Outpatient Follow-up:  1. Complete Augmentin for dog bite 2. Resume Lasix at prior to admission doses--continue Cardizem at lower doses as this can exacerbate heart failure 3. He needs to follow-up with his outpatient nephrologist who sees him at [facility] 4. I believe that his weight was up because of overeating and recent holidays-he does not have fluid weight on him and he is noncompliant with fluid and salt restriction which has been emphasized to him 5. Please offer as an outpatient alcohol cessation counseling  Discharge Diagnoses:  Principal Problem:   Acute diastolic CHF (congestive heart failure) ([place]) Active Problems:   Personal history of prostate cancer s/p prostatectomy 2012   Paroxysmal atrial fibrillation (HCC)   Type 2 diabetes mellitus with stage 3 chronic kidney disease, without long-term current use of insulin (HCC)   Anemia due to chronic kidney disease   ARF (acute renal failure) ([place])   Discharge Condition: good  Diet recommendation: hh low salt  Filed Weights   11/25/18 0039 11/25/18 0404 11/26/18 0511  Weight: 81.6 kg 84.7 kg 84 kg    History of present illness:  57 AAM Hfpef,PAfib, DM tyii, anemia, Admitted after dog bite and brought in and found to have shortness of breath on admission Found to also have decompensated HFpEF with mild fluid overload He was given 1 dose of Lasix in the emergency room and then patient was admitted He was tolerating diet well and wished to go home he did not feel short of breath his creatinine had bumped from baseline of 2 to about 3.3 and trended downwards further-he did not have any overt volume loss  Concerned that x-rays as 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 as he continues to drink-he will need close  follow-up and management of his fluid status in the outpatient setting with nephrology and coordination with cardiology  I have cut back his Cardizem because this can also exacerbate or worsen heart failure I suspect he will continue to drink when he leaves     Discharge Exam: Vitals:   11/25/18 2045 11/26/18 0508  BP: (!) 187/77 (!) 154/75  Pulse: 79 72  Resp: 18 18  Temp: 98.6 F (37 C) 98.3 F (36.8 C)  SpO2: 96% 95%    General: Awake alert pleasant no distress EOMI NCAT No icterus no pallor Cardiovascular: S1-S2 no murmur rub or gallop sinus rhythm with pauses and occasional PACs with intermittent A. fib Respiratory: Clinically clear no added sound  Discharge Instructions    Allergies as of 11/26/2018      Reactions   Hydrochlorothiazide Other (See Comments)   Pt gets hyponatremia   Lasix [furosemide] Other (See Comments)   Sodium levels drop when take   Nsaids Other (See Comments)   Kidney disease/failure   Sulfa Antibiotics Other (See Comments)   headaches      Medication List    TAKE these medications   amoxicillin-clavulanate 875-125 MG tablet Commonly known as:  AUGMENTIN Take 1 tablet by mouth 2 (two) times daily.   cloNIDine 0.1 MG tablet Commonly known as:  CATAPRES Take 0.1 mg by mouth 2 (two) times daily.   diltiazem 180 MG 24 hr capsule Commonly known as:  CARDIZEM CD Take 1 capsule (180 mg total) by mouth every morning. What changed:    medication strength  how much to take   ferrous sulfate 325 (65 FE) MG tablet TAKE 1 TABLET BY MOUTH EVERY DAY What changed:  when to take this   furosemide 40 MG tablet Commonly known as:  LASIX TAKE 1 TABLET DAILY   hydrOXYzine 50 MG tablet Commonly known as:  ATARAX/VISTARIL TAKE 1/2-2 TABLETS (25-100 MG TOTAL) BY MOUTH AT BEDTIME AS NEEDED FOR ANXIETY (INSOMNIA).   isosorbide-hydrALAZINE 20-37.5 MG tablet Commonly known as:  BIDIL Take 2 tablets by mouth 2 (two) times daily.   Januvia 100  MG tablet Generic drug:  sitaGLIPtin TAKE 1 TABLET BY MOUTH EVERY DAY What changed:  how much to take   labetalol 200 MG tablet Commonly known as:  NORMODYNE Take 2 tablets (400 mg total) by mouth 2 (two) times daily.   NyQuil HBP Cold & Flu 15-6.25-325 MG/15ML Liqd Generic drug:  DM-Doxylamine-Acetaminophen Take 30 mLs by mouth at bedtime as needed (cough/sleep).   Vitamin D (Ergocalciferol) 1.25 MG (50000 UT) Caps capsule Commonly known as:  DRISDOL Take 50,000 Units by mouth every Wednesday.   Xarelto 15 MG Tabs tablet Generic drug:  Rivaroxaban TAKE 1 TABLET (15 MG TOTAL) BY MOUTH DAILY WITH SUPPER. What changed:  See the new instructions.   zolpidem 10 MG tablet Commonly known as:  AMBIEN TAKE 1 TABLET (10 MG TOTAL) BY MOUTH AT BEDTIME AS NEEDED. FOR SLEEP What changed:    when to take this  additional instructions      Allergies  Allergen Reactions  . Hydrochlorothiazide Other (See Comments)    Pt gets hyponatremia  . Lasix [Furosemide] Other (See Comments)    Sodium levels drop when take  . Nsaids Other (See Comments)    Kidney disease/failure  . Sulfa Antibiotics Other (See Comments)    headaches      The results of significant diagnostics from this hospitalization (including imaging, microbiology, ancillary and laboratory) are listed below for reference.    Significant Diagnostic Studies: Ct Chest Wo Contrast  Result Date: 11/25/2018 CLINICAL DATA:  Shortness of breath. History of prostate cancer with surgery in 2012 EXAM: CT CHEST WITHOUT CONTRAST TECHNIQUE: Multidetector CT imaging of the chest was performed following the standard protocol without IV contrast. COMPARISON:  Chest x-ray from earlier today.  Chest CT 06/26/2011 FINDINGS: Cardiovascular: Normal heart size. No pericardial effusion. Atherosclerotic calcification of the aorta. No acute vascular finding without contrast. Aberrant right subclavian artery. Mediastinum/Nodes: Mild enlargement of  mediastinal lymph nodes when compared to prior, presumably congestive in this setting. Lungs/Pleura: The central airways are clear. Generalized airway cuffing, interlobular septal thickening, and trace pleural effusions. No consolidation or generalized ground-glass opacity. Upper Abdomen: No acute finding Musculoskeletal: No acute or aggressive finding. Remote T5 and T6 compression fractures. IMPRESSION: Pulmonary edema and trace pleural effusions. Electronically Signed   By: Monte Fantasia M.D.   On: 11/25/2018 08:22   Dg Chest Port 1 View  Result Date: 11/25/2018 CLINICAL DATA:  Shortness of breath. Mid chest pain. EXAM: PORTABLE CHEST 1 VIEW COMPARISON:  Radiograph 08/31/2018 FINDINGS: Cardiomegaly appears similar to prior exam. Pulmonary edema and pleural effusions have resolved. No acute airspace disease. No pneumothorax. No acute osseous abnormalities. Remote left rib fracture. IMPRESSION: Stable cardiomegaly. No acute chest findings. Previous pulmonary edema and pleural effusions have resolved. Electronically Signed   By: Keith Rake M.D.   On: 11/25/2018 01:16    Microbiology: No results found for this or any previous visit (from the past 240 hour(s)).   Labs: Basic Metabolic  Panel: Recent Labs  Lab 11/25/18 0127 11/25/18 0502 11/25/18 1454  NA 128* 129* 132*  K 3.9 4.1 4.0  CL 96* 97* 100  CO2 20* 21* 22  GLUCOSE 187* 172* 92  BUN 50* 49* 45*  CREATININE 3.47* 3.39* 3.23*  CALCIUM 8.6* 8.6* 8.4*   Liver Function Tests: Recent Labs  Lab 11/25/18 0502  AST 13*  ALT 15  ALKPHOS 40  BILITOT 0.7  PROT 6.7  ALBUMIN 3.6   Recent Labs  Lab 11/25/18 0502  LIPASE 53*   No results for input(s): AMMONIA in the last 168 hours. CBC: Recent Labs  Lab 11/25/18 0127 11/25/18 0502  WBC 7.9 6.6  NEUTROABS 6.2 5.3  HGB 9.0* 8.8*  HCT 26.8* 27.0*  MCV 98.2 98.9  PLT 214 199   Cardiac Enzymes: Recent Labs  Lab 11/25/18 0127 11/25/18 0502 11/25/18 1454  TROPONINI  <0.03 <0.03  <0.03 <0.03   BNP: BNP (last 3 results) Recent Labs    08/31/18 0936 11/25/18 0127  BNP 779.5* 409.1*    ProBNP (last 3 results) No results for input(s): PROBNP in the last 8760 hours.  CBG: Recent Labs  Lab 11/25/18 0817 11/25/18 1145 11/25/18 1656 11/25/18 2136 11/26/18 0806  GLUCAP 143* 139* 114* 107* 169*       Signed:  Nita Sells MD   [facility] 11/26/2018, 10:13 AM

## 2018-11-26 NOTE — TOC Transition Note (Signed)
Transition of Care [facility]) - CM/SW Discharge Note   Patient Details  Name: Douglas Ballard MRN: [id number] Date of Birth: 1951/03/31  Transition of Care [facility]) CM/SW Contact:  Dessa Phi, RN Phone Number: 11/26/2018, 10:44 AM   Clinical Narrative:   D/c home No CM needs.    Final next level of care: Home/Self Care Barriers to Discharge: No Barriers Identified   Patient Goals and CMS Choice Patient states their goals for this hospitalization and ongoing recovery are:: (go home)      Discharge Placement                       Discharge Plan and Services                          Social Determinants of Health (SDOH) Interventions     Readmission Risk Interventions No flowsheet data found.

## 2018-11-27 ENCOUNTER — Telehealth: Payer: Self-pay | Admitting: Family Medicine

## 2018-11-27 ENCOUNTER — Telehealth: Payer: Self-pay

## 2018-11-27 NOTE — Telephone Encounter (Signed)
LM for patient to return call.

## 2018-11-27 NOTE — Telephone Encounter (Signed)
See note  Copied from [place] [id number]. Topic: Referral - Request for Referral >> Nov 27, 2018  2:48 PM Nils Flack wrote: Has patient seen PCP for this complaint?  *If NO, is insurance requiring patient see PCP for this issue before PCP can refer them? Referral for which specialty: home health nursing - for follow up care of heart failure  Preferred provider/office: [facility] [phone]  Reason for referral: pt was discharged from hospital - family Is requesting referral

## 2018-12-01 NOTE — Telephone Encounter (Signed)
LM again for patient to return call.  Needs appointment scheduled before home health referral can be placed.  CRM placed.

## 2018-12-03 NOTE — Telephone Encounter (Signed)
Spoke to Douglas Ballard told him need to schedule virtual visit in order to place Home health referral. Douglas Ballard verbalized understanding and appt scheduled for tomorrow at 1:40 with Dr. [name].

## 2018-12-04 ENCOUNTER — Encounter: Payer: Self-pay | Admitting: Family Medicine

## 2018-12-04 ENCOUNTER — Ambulatory Visit (INDEPENDENT_AMBULATORY_CARE_PROVIDER_SITE_OTHER): Payer: BC Managed Care – PPO | Admitting: Family Medicine

## 2018-12-04 VITALS — Temp 97.1°F | Wt 172.0 lb

## 2018-12-04 DIAGNOSIS — I10 Essential (primary) hypertension: Secondary | ICD-10-CM | POA: Diagnosis not present

## 2018-12-04 DIAGNOSIS — E1159 Type 2 diabetes mellitus with other circulatory complications: Secondary | ICD-10-CM

## 2018-12-04 DIAGNOSIS — I5032 Chronic diastolic (congestive) heart failure: Secondary | ICD-10-CM

## 2018-12-04 DIAGNOSIS — N183 Chronic kidney disease, stage 3 unspecified: Secondary | ICD-10-CM

## 2018-12-04 DIAGNOSIS — I152 Hypertension secondary to endocrine disorders: Secondary | ICD-10-CM

## 2018-12-04 NOTE — Assessment & Plan Note (Signed)
Found to have acute on chronic kidney disease during recent hospitalization.  Creatinine seems to be improving based on his trend.  He has blood work scheduled for next week and will be following with neurology soon.  No current signs of volume overload.  Continue Lasix 40 mg daily.

## 2018-12-04 NOTE — Assessment & Plan Note (Signed)
Stable.  Continue clonidine 0.1 mg twice daily, Cardizem 180 mg daily, BiDil 2 tablets twice daily, and labetalol 400 mg 3 times daily.

## 2018-12-04 NOTE — Progress Notes (Signed)
Chief Complaint:  Douglas Ballard is a 68 y.o. male who presents today for a virtual TCM visit.  Assessment/Plan:  Hypertension associated with diabetes ([place]) Stable.  Continue clonidine 0.1 mg twice daily, Cardizem 180 mg daily, BiDil 2 tablets twice daily, and labetalol 400 mg 3 times daily.  CKD (chronic kidney disease) stage 3, GFR 30-59 ml/min (HCC) Found to have acute on chronic kidney disease during recent hospitalization.  Creatinine seems to be improving based on his trend.  He has blood work scheduled for next week and will be following with neurology soon.  No current signs of volume overload.  Continue Lasix 40 mg daily.  Chronic diastolic CHF (congestive heart failure) (HCC) Hospitalized due to acute exacerbation however seems to be resolving.  No signs of volume overload today.  Continue current management plan per cardiology.  Advised patient importance of salt avoidance and fluid restriction.  Also discussed importance of daily weight monitoring.  He voiced understanding.  Patient has a moderate level of medical complexity due to number of diagnoses/treatment options and amount/complexity of data reviewed.     Subjective:  HPI:  Summary of Hospital admission: Reason for admission: Shortness of breath Date of admission: 11/25/2018 Date of discharge: 11/26/2018 Date of Interactive contact: Attempted on 11/27/2018 and again on 12/01/2018. Summary of Hospital course: Patient presented to the ED on 11/25/2018 with progressive shortness of breath for several days.  He was found to have heart failure exacerbation.  Was given a dose of Lasix in the ED and admitted for CHF exacerbation.  Symptoms markedly improved by hospital day 1 and he was discharged later that day.  His dose of diltiazem was decreased to 180 mg.  Interim history outlined by problem:   # Hypertension / Paroxysmal Afib / HfpEF - Currently on labetalol 464m three times daily, clonidine 0.1 mg 2 times daily, Bidil  20-37.522m2 tablets twice daily, and cardizem 18054maily -Anticoagulated on Xarelto 15 mg daily. -He has done very well since being discharged.  No longer having any shortness of breath.  He has been compliant with his medications without side effects. -ROS: No reported chest pain or dyspnea on exertion  % CKD Stage 3 - [facility] next week.  No lower extremity swelling.  No dyspnea on exertion.  Weight is relatively stable.  Has been trying to work on salt avoidance.  ROS: Per HPI, otherwise a complete review of systems was negative.   PMH:  The following were reviewed and entered/updated in epic: Past Medical History:  Diagnosis Date  . Alcohol withdrawal seizure ([place]CC[place]1/03/2011   Alcohol withdraw seizure prior to admission is suspected from history given by family & Post ictal appearance in the ED.   . AMarland Kitchenxiety   . Atrial fibrillation ([facility] . [facility]   Prostate surgery  . Chronic diastolic CHF (congestive heart failure) ([place]CC[place]/14/2016   Echo 8/15: Mild LVH, EF 50-55%, moderate BAE  //  b. Echo 7/17: EF 55-60%, normal wall motion, grade 2 diastolic dysfunction, MAC, mild MR, moderate LAE, trivial PI  . Compression fracture of thoracic vertebra (HCCS[place]1/01/2011  . Diabetes mellitus type 2 in nonobese (HCC)   . Fistula, bladder   . Frequent urination at night   . History of adenomatous polyp of colon 06/14/2014  . History of nuclear stress test    a. Myoview 10/15: Overall Impression: Low risk stress nuclear study demonstrating  mild baseline ST-T changes with normal myocardial perfusion and low normal EF of 50%. // b.Myoview 7/17: EF 50%, no ischemia or scar, low risk (EF normal by recent echo)  . Hypertension   . PNA (pneumonia) 06/26/2011  . Sepsis due to urinary tract infection ([place]) 04/16/2014  . Stroke ([place]) sept 1, 2015   tia x 3   Patient Active Problem List   Diagnosis Date Noted  . Anemia due  to chronic kidney disease 08/31/2018  . Stress 08/01/2018  . Type 2 diabetes mellitus with stage 3 chronic kidney disease, without long-term current use of insulin ([place]) 09/06/2017  . CKD (chronic kidney disease) stage 3, GFR 30-59 ml/min (HCC) 02/07/2016  . Insomnia 09/19/2015  . Coarse tremors 09/19/2015  . Chronic diastolic CHF (congestive heart failure) ([place]) 11/01/2014  . History of adenomatous polyp of colon 06/14/2014  . Paroxysmal atrial fibrillation ([place]) 05/19/2014  . Elevated bilirubin 04/14/2014  . 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 Alcohol use disorder, moderate, dependence ([place]) 06/27/2011  . Hypertension associated with diabetes ([place]) 06/26/2011  . Personal history of prostate cancer s/p prostatectomy 2012 06/26/2011   Past Surgical History:  Procedure Laterality Date  . COLON SURGERY    . COLONOSCOPY N/A 06/14/2014   Procedure: COLONOSCOPY;  Surgeon: Gatha Mayer, MD;  Location: [facility] ENDOSCOPY;  Service: Endoscopy;  Laterality: N/A;  . CYSTOSCOPY WITH STENT PLACEMENT Bilateral 06/15/2014   Procedure: CYSTOSCOPY WITH BILATERAL STENT PLACEMENT;  Surgeon: Bernestine Amass, MD;  Location: [facility] ORS;  Service: Urology;  Laterality: Bilateral;  . LIPOMA EXCISION  2012   Dr Nonah Mattes  . moles removed     Back  . PROCTOSCOPY N/A 06/15/2014   Procedure: RIGID PROCTOSCOPY;  Surgeon: Michael Boston, MD;  Location: [facility] ORS;  Service: General;  Laterality: N/A;  . RADIOLOGY WITH ANESTHESIA N/A 07/28/2014   Procedure: Embolization;  Surgeon: Luanne Bras, MD;  Location: [facility];  Service: Radiology;  Laterality: N/A;  . ROBOT ASSISTED LAPAROSCOPIC RADICAL PROSTATECTOMY  01/31/2011   Robotic-assisted laparoscopic radical retropubic    Family History  Problem Relation Age of Onset  . Atrial fibrillation Father        onset 61s. Had pacemaker placed for sinus pause  . Prostate cancer Father   . Breast cancer Mother   . Lung cancer Mother   . Leukemia Brother    . Colon polyps Brother   . Colon cancer Neg Hx   . Diabetes Neg Hx     Medications- Reconciled discharge and current medications in Epic.  Current Outpatient Medications  Medication Sig Dispense Refill  . cloNIDine (CATAPRES) 0.1 MG tablet Take 0.1 mg by mouth 2 (two) times daily.     Marland Kitchen diltiazem (CARDIZEM CD) 180 MG 24 hr capsule Take 1 capsule (180 mg total) by mouth every morning. 30 capsule 0  . DM-Doxylamine-Acetaminophen (NYQUIL HBP COLD & FLU) 15-6.25-325 MG/15ML LIQD Take 30 mLs by mouth at bedtime as needed (cough/sleep).    . ferrous sulfate 325 (65 FE) MG tablet TAKE 1 TABLET BY MOUTH EVERY DAY (Patient taking differently: Take 325 mg by mouth every Wednesday. ) 30 tablet 0  . furosemide (LASIX) 40 MG tablet TAKE 1 TABLET DAILY (Patient taking differently: Take 40 mg by mouth daily. ) 30 tablet 0  . hydrOXYzine (ATARAX/VISTARIL) 50 MG tablet TAKE 1/2-2 TABLETS (25-100 MG TOTAL) BY MOUTH AT BEDTIME AS NEEDED FOR ANXIETY (INSOMNIA). 90 tablet 1  . isosorbide-hydrALAZINE (BIDIL) 20-37.5 MG tablet Take 2  tablets by mouth 2 (two) times daily.     Marland Kitchen JANUVIA 100 MG tablet TAKE 1 TABLET BY MOUTH EVERY DAY (Patient taking differently: Take 100 mg by mouth daily. ) 90 tablet 1  . labetalol (NORMODYNE) 200 MG tablet Take 2 tablets (400 mg total) by mouth 2 (two) times daily.    . Vitamin D, Ergocalciferol, (DRISDOL) 1.25 MG (50000 UT) CAPS capsule Take 50,000 Units by mouth every Wednesday.    Alveda Reasons 15 MG TABS tablet TAKE 1 TABLET (15 MG TOTAL) BY MOUTH DAILY WITH SUPPER. (Patient taking differently: Take 15 mg by mouth daily. ) 90 tablet 0  . zolpidem (AMBIEN) 10 MG tablet TAKE 1 TABLET (10 MG TOTAL) BY MOUTH AT BEDTIME AS NEEDED. FOR SLEEP (Patient taking differently: Take 10 mg by mouth at bedtime. ) 30 tablet 5   No current facility-administered medications for this visit.     Allergies-reviewed and updated Allergies  Allergen Reactions  . Hydrochlorothiazide Other (See Comments)     Pt gets hyponatremia  . Lasix [Furosemide] Other (See Comments)    Sodium levels drop when take  . Nsaids Other (See Comments)    Kidney disease/failure  . Sulfa Antibiotics Other (See Comments)    headaches    Social History   Socioeconomic History  . Marital status: Married    Spouse name: Not on file  . Number of children: 2  . Years of education: Not on file  . Highest education level: Not on file  Occupational History  . Occupation: TEFL teacher  Social Needs  . Financial resource strain: Not on file  . Food insecurity:    Worry: Not on file    Inability: Not on file  . Transportation needs:    Medical: Not on file    Non-medical: Not on file  Tobacco Use  . Smoking status: Former Smoker    Packs/day: 2.00    Years: 25.00    Pack years: 50.00    Types: Cigarettes    Last attempt to quit: 04/14/2014    Years since quitting: 4.6  . Smokeless tobacco: Never Used  Substance and Sexual Activity  . Alcohol use: Yes    Alcohol/week: 7.0 - 10.0 standard drinks    Types: 7 - 10 Standard drinks or equivalent per week    Comment: 2 bottles of wine with wife a week; cocktail every other night  . Drug use: No  . Sexual activity: Not Currently  Lifestyle  . Physical activity:    Days per week: Not on file    Minutes per session: Not on file  . Stress: Not on file  Relationships  . Social connections:    Talks on phone: Not on file    Gets together: Not on file    Attends religious service: Not on file    Active member of club or organization: Not on file    Attends meetings of clubs or organizations: Not on file    Relationship status: Not on file  Other Topics Concern  . Not on file  Social History Narrative   Fun/Hobby: Walk, cards, gardening         Objective:  Physical Exam: Temp (!) 97.1 F (36.2 C) (Oral)   Wt 172 lb (78 kg)   BMI 25.40 kg/m   Gen: NAD, resting comfortably Pulm: Normal work of breathing, speaking in full sentences  MSK: No edema, cyanosis, or clubbing noted Skin: Warm, dry Neuro: Grossly normal, moves all  extremities Psych: Normal affect and thought content   Virtual Visit via Video   I connected with Douglas Ballard on 12/04/18 at  1:40 PM EDT by a video enabled telemedicine application and verified that I am speaking with the correct person using two identifiers. I discussed the limitations of evaluation and management by telemedicine and the availability of in person appointments. The patient expressed understanding and agreed to proceed.   Patient location: Home Provider location: [place] participating in the virtual visit: Myself and patient     [name]. [name], MD 12/04/2018 3:25 PM

## 2018-12-04 NOTE — Assessment & Plan Note (Signed)
Hospitalized due to acute exacerbation however seems to be resolving.  No signs of volume overload today.  Continue current management plan per cardiology.  Advised patient importance of salt avoidance and fluid restriction.  Also discussed importance of daily weight monitoring.  He voiced understanding.

## 2018-12-11 ENCOUNTER — Telehealth: Payer: Self-pay | Admitting: Family Medicine

## 2018-12-11 NOTE — Telephone Encounter (Signed)
Spoke with patient wife Douglas Ballard stated at patient appt he was informed by Dr .[name]  That he was referral to [facility] for help with grocery shopping and lawn care.Wife stated he needs a referral for a nurse to come out and measure fluids in his lungs due to CHF.Stated vest that's needed is Reds Vest lung fluid measurement.

## 2018-12-11 NOTE — Telephone Encounter (Signed)
Copied from [place] [id number]. Topic: Referral - Question >> Dec 11, 2018  1:21 PM Ivar Drape wrote: Reason for CRM:   There wasn't any answer when [place] was called.  The patient believes the wrong reason for the referral went to [facility].  Please return call. [phone]

## 2018-12-11 NOTE — Telephone Encounter (Signed)
[facility] with home health aide referral. For the vest think he needs to discuss with cardiology - I believe they are the ones that set that up.  [name]. [name], MD 12/11/2018 4:19 PM

## 2018-12-11 NOTE — Telephone Encounter (Signed)
They would like a HH / nursing referral put in for [facility].  Please call back at [phone] with any questions.

## 2018-12-12 ENCOUNTER — Other Ambulatory Visit: Payer: Self-pay

## 2018-12-12 DIAGNOSIS — N183 Chronic kidney disease, stage 3 unspecified: Secondary | ICD-10-CM

## 2018-12-12 DIAGNOSIS — Z87891 Personal history of nicotine dependence: Secondary | ICD-10-CM | POA: Diagnosis not present

## 2018-12-12 DIAGNOSIS — D631 Anemia in chronic kidney disease: Secondary | ICD-10-CM | POA: Diagnosis not present

## 2018-12-12 DIAGNOSIS — Z7984 Long term (current) use of oral hypoglycemic drugs: Secondary | ICD-10-CM | POA: Diagnosis not present

## 2018-12-12 DIAGNOSIS — I5032 Chronic diastolic (congestive) heart failure: Secondary | ICD-10-CM

## 2018-12-12 DIAGNOSIS — I129 Hypertensive chronic kidney disease with stage 1 through stage 4 chronic kidney disease, or unspecified chronic kidney disease: Secondary | ICD-10-CM | POA: Diagnosis not present

## 2018-12-12 DIAGNOSIS — E1122 Type 2 diabetes mellitus with diabetic chronic kidney disease: Secondary | ICD-10-CM | POA: Diagnosis not present

## 2018-12-12 NOTE — Telephone Encounter (Signed)
Darlina Guys with [facility] called to say that they are all set and will be out to see patient on 12/17/2018. Any questions please call Ph# [phone]

## 2018-12-12 NOTE — Telephone Encounter (Signed)
Referral has been placed. 

## 2018-12-12 NOTE — Telephone Encounter (Signed)
See note

## 2018-12-13 ENCOUNTER — Other Ambulatory Visit: Payer: Self-pay | Admitting: Family Medicine

## 2018-12-16 ENCOUNTER — Other Ambulatory Visit: Payer: Self-pay | Admitting: Family Medicine

## 2018-12-17 ENCOUNTER — Telehealth: Payer: Self-pay | Admitting: Family Medicine

## 2018-12-17 DIAGNOSIS — I152 Hypertension secondary to endocrine disorders: Secondary | ICD-10-CM | POA: Diagnosis not present

## 2018-12-17 DIAGNOSIS — N183 Chronic kidney disease, stage 3 (moderate): Secondary | ICD-10-CM | POA: Diagnosis not present

## 2018-12-17 DIAGNOSIS — I480 Paroxysmal atrial fibrillation: Secondary | ICD-10-CM | POA: Diagnosis not present

## 2018-12-17 DIAGNOSIS — E1159 Type 2 diabetes mellitus with other circulatory complications: Secondary | ICD-10-CM | POA: Diagnosis not present

## 2018-12-17 DIAGNOSIS — G4700 Insomnia, unspecified: Secondary | ICD-10-CM | POA: Diagnosis not present

## 2018-12-17 DIAGNOSIS — F419 Anxiety disorder, unspecified: Secondary | ICD-10-CM | POA: Diagnosis not present

## 2018-12-17 DIAGNOSIS — D631 Anemia in chronic kidney disease: Secondary | ICD-10-CM | POA: Diagnosis not present

## 2018-12-17 DIAGNOSIS — E1122 Type 2 diabetes mellitus with diabetic chronic kidney disease: Secondary | ICD-10-CM | POA: Diagnosis not present

## 2018-12-17 DIAGNOSIS — I5032 Chronic diastolic (congestive) heart failure: Secondary | ICD-10-CM | POA: Diagnosis not present

## 2018-12-17 NOTE — Telephone Encounter (Signed)
See note  Copied from [place] [id number]. Topic: Quick Communication - Home Health Verbal Orders >> Dec 17, 2018 12:15 PM Richardo Priest, [place] wrote: Caller/Agency: [facility] Callback Number: [phone] Requesting OT/PT/Skilled Nursing/Social Work/Speech Therapy: Skilled nursing for heart failure, renal, diabetes  Frequency: twice a week for three weeks and two PRN

## 2018-12-18 NOTE — Telephone Encounter (Signed)
Called and gave verbal orders 

## 2018-12-19 ENCOUNTER — Telehealth: Payer: Self-pay | Admitting: Family Medicine

## 2018-12-19 ENCOUNTER — Other Ambulatory Visit: Payer: Self-pay

## 2018-12-19 MED ORDER — DILTIAZEM HCL ER COATED BEADS 180 MG PO CP24: 180.0000 mg | ORAL_CAPSULE | Freq: Every morning | ORAL | 0 refills | Status: DC

## 2018-12-19 MED ORDER — FUROSEMIDE 40 MG PO TABS: 40.0000 mg | ORAL_TABLET | Freq: Every day | ORAL | 0 refills | Status: DC

## 2018-12-19 NOTE — Telephone Encounter (Signed)
See Rx refill request.  

## 2018-12-19 NOTE — Telephone Encounter (Signed)
See note

## 2018-12-19 NOTE — Telephone Encounter (Signed)
Copied from [place] [phone]. Topic: Quick Communication - Rx Refill/Question >> Dec 19, 2018  3:14 PM Pauline Good wrote: Medication: diltiazem 24h er cd and furosemide 40mg   Has the patient contacted their pharmacy? no (Agent: If no, request that the patient contact the pharmacy for the refill.) (Agent: If yes, when and what did the pharmacy advise?)  Preferred Pharmacy (with phone number or street name): [facility]  Agent: Please be advised that RX refills may take up to 3 business days. We ask that you follow-up with your pharmacy.

## 2018-12-19 NOTE — Telephone Encounter (Signed)
Copied from [place] [phone]. Topic: General - Other >> Dec 19, 2018  3:17 PM Pauline Good wrote: Reason for CRM: pt need letter written stating he is high risk for Covid virus to get a refund back from a plane ticket  he has gotten. Please advise

## 2018-12-19 NOTE — Telephone Encounter (Signed)
Rx sent 

## 2018-12-19 NOTE — Progress Notes (Signed)
fu

## 2018-12-22 NOTE — Telephone Encounter (Signed)
Douglas Ballard with letter saying patient is at high risk for covid.  [name]. [name], MD 12/22/2018 9:51 AM

## 2018-12-22 NOTE — Telephone Encounter (Signed)
Letter has been written and is available in the patient's MyChart.

## 2018-12-25 ENCOUNTER — Other Ambulatory Visit: Payer: Self-pay

## 2018-12-25 ENCOUNTER — Telehealth: Payer: Self-pay

## 2018-12-25 ENCOUNTER — Telehealth: Payer: Self-pay | Admitting: Family Medicine

## 2018-12-25 DIAGNOSIS — E1122 Type 2 diabetes mellitus with diabetic chronic kidney disease: Secondary | ICD-10-CM | POA: Diagnosis not present

## 2018-12-25 DIAGNOSIS — F419 Anxiety disorder, unspecified: Secondary | ICD-10-CM | POA: Diagnosis not present

## 2018-12-25 DIAGNOSIS — I152 Hypertension secondary to endocrine disorders: Secondary | ICD-10-CM | POA: Diagnosis not present

## 2018-12-25 DIAGNOSIS — I5032 Chronic diastolic (congestive) heart failure: Secondary | ICD-10-CM | POA: Diagnosis not present

## 2018-12-25 DIAGNOSIS — G4700 Insomnia, unspecified: Secondary | ICD-10-CM | POA: Diagnosis not present

## 2018-12-25 DIAGNOSIS — N183 Chronic kidney disease, stage 3 (moderate): Secondary | ICD-10-CM | POA: Diagnosis not present

## 2018-12-25 DIAGNOSIS — D631 Anemia in chronic kidney disease: Secondary | ICD-10-CM | POA: Diagnosis not present

## 2018-12-25 DIAGNOSIS — E1159 Type 2 diabetes mellitus with other circulatory complications: Secondary | ICD-10-CM | POA: Diagnosis not present

## 2018-12-25 DIAGNOSIS — I480 Paroxysmal atrial fibrillation: Secondary | ICD-10-CM | POA: Diagnosis not present

## 2018-12-25 MED ORDER — DILTIAZEM HCL ER COATED BEADS 180 MG PO CP24: 180.0000 mg | ORAL_CAPSULE | Freq: Every morning | ORAL | 0 refills | Status: DC

## 2018-12-25 NOTE — Telephone Encounter (Signed)
Copied from [place] [phone]. Topic: General - Other >> Dec 25, 2018 12:10 PM Keene Breath wrote: Reason for CRM: The nurse, April with [facility], called to speak with the patient's nurse regarding his condition today.  Please call back at [phone]

## 2018-12-25 NOTE — Telephone Encounter (Signed)
Recommend follow up with cardiology to discuss med management.  Douglas Ballard. [name], MD 12/25/2018 2:41 PM

## 2018-12-25 NOTE — Telephone Encounter (Signed)
Spoke with April.  She states the patient's RED'S score was high today at 54.  She wanted to inform.   No weight increase, no increased edema, no SOB.  Says he just "feels bad". Patient taking Lasix but no other diuretic. Patient not taking potassium. April will call to report score again tomorrow.  If meds need to be adjusted, please advise.

## 2018-12-25 NOTE — Telephone Encounter (Signed)
Error

## 2018-12-26 ENCOUNTER — Telehealth: Payer: Self-pay

## 2018-12-26 ENCOUNTER — Encounter: Payer: Self-pay | Admitting: Family Medicine

## 2018-12-26 DIAGNOSIS — D631 Anemia in chronic kidney disease: Secondary | ICD-10-CM | POA: Diagnosis not present

## 2018-12-26 DIAGNOSIS — I480 Paroxysmal atrial fibrillation: Secondary | ICD-10-CM | POA: Diagnosis not present

## 2018-12-26 DIAGNOSIS — E1122 Type 2 diabetes mellitus with diabetic chronic kidney disease: Secondary | ICD-10-CM | POA: Diagnosis not present

## 2018-12-26 DIAGNOSIS — F419 Anxiety disorder, unspecified: Secondary | ICD-10-CM | POA: Diagnosis not present

## 2018-12-26 DIAGNOSIS — G4700 Insomnia, unspecified: Secondary | ICD-10-CM | POA: Diagnosis not present

## 2018-12-26 DIAGNOSIS — E1159 Type 2 diabetes mellitus with other circulatory complications: Secondary | ICD-10-CM | POA: Diagnosis not present

## 2018-12-26 DIAGNOSIS — I5032 Chronic diastolic (congestive) heart failure: Secondary | ICD-10-CM | POA: Diagnosis not present

## 2018-12-26 DIAGNOSIS — N183 Chronic kidney disease, stage 3 (moderate): Secondary | ICD-10-CM | POA: Diagnosis not present

## 2018-12-26 DIAGNOSIS — I152 Hypertension secondary to endocrine disorders: Secondary | ICD-10-CM | POA: Diagnosis not present

## 2018-12-26 MED ORDER — RIVAROXABAN 15 MG PO TABS: ORAL_TABLET | ORAL | 0 refills | Status: DC

## 2018-12-26 NOTE — Telephone Encounter (Signed)
68 yo  Male Scr - 3.23 on April/2020 Last OV with DR Martinique - October/2018  *Rx sent without refills* Notification sent via myChart to schedule f/u appointment.

## 2018-12-30 ENCOUNTER — Telehealth: Payer: Self-pay | Admitting: Family Medicine

## 2018-12-30 DIAGNOSIS — G4700 Insomnia, unspecified: Secondary | ICD-10-CM | POA: Diagnosis not present

## 2018-12-30 DIAGNOSIS — E1159 Type 2 diabetes mellitus with other circulatory complications: Secondary | ICD-10-CM | POA: Diagnosis not present

## 2018-12-30 DIAGNOSIS — E1122 Type 2 diabetes mellitus with diabetic chronic kidney disease: Secondary | ICD-10-CM | POA: Diagnosis not present

## 2018-12-30 DIAGNOSIS — F419 Anxiety disorder, unspecified: Secondary | ICD-10-CM | POA: Diagnosis not present

## 2018-12-30 DIAGNOSIS — N183 Chronic kidney disease, stage 3 (moderate): Secondary | ICD-10-CM | POA: Diagnosis not present

## 2018-12-30 DIAGNOSIS — I480 Paroxysmal atrial fibrillation: Secondary | ICD-10-CM | POA: Diagnosis not present

## 2018-12-30 DIAGNOSIS — D631 Anemia in chronic kidney disease: Secondary | ICD-10-CM | POA: Diagnosis not present

## 2018-12-30 DIAGNOSIS — I152 Hypertension secondary to endocrine disorders: Secondary | ICD-10-CM | POA: Diagnosis not present

## 2018-12-30 DIAGNOSIS — I5032 Chronic diastolic (congestive) heart failure: Secondary | ICD-10-CM | POA: Diagnosis not present

## 2018-12-30 NOTE — Telephone Encounter (Signed)
See note

## 2018-12-30 NOTE — Telephone Encounter (Signed)
Copied from [place]. Topic: Quick Communication - Home Health Verbal Orders >> Dec 30, 2018 12:11 PM Leward Quan A wrote: Caller/Agency: April / [facility] Number: [phone] ok to LM Requesting OT/PT/Skilled Nursing/Social Work/Speech Therapy: Skilled nursing Frequency: 1 time this week and once next week and discharge  For red vest since he is working and not home bound

## 2018-12-30 NOTE — Telephone Encounter (Signed)
Spoke with April and gave verbal orders.

## 2019-01-04 ENCOUNTER — Encounter: Payer: Self-pay | Admitting: Family Medicine

## 2019-01-07 ENCOUNTER — Other Ambulatory Visit: Payer: Self-pay

## 2019-01-07 MED ORDER — FUROSEMIDE 40 MG PO TABS: 80.0000 mg | ORAL_TABLET | Freq: Every day | ORAL | 3 refills | Status: DC

## 2019-01-08 DIAGNOSIS — N321 Vesicointestinal fistula: Secondary | ICD-10-CM

## 2019-01-08 DIAGNOSIS — Z8546 Personal history of malignant neoplasm of prostate: Secondary | ICD-10-CM

## 2019-01-08 DIAGNOSIS — Z86010 Personal history of colonic polyps: Secondary | ICD-10-CM

## 2019-01-08 DIAGNOSIS — F1020 Alcohol dependence, uncomplicated: Secondary | ICD-10-CM

## 2019-01-08 DIAGNOSIS — Z7901 Long term (current) use of anticoagulants: Secondary | ICD-10-CM

## 2019-01-08 DIAGNOSIS — G4700 Insomnia, unspecified: Secondary | ICD-10-CM | POA: Diagnosis not present

## 2019-01-08 DIAGNOSIS — F419 Anxiety disorder, unspecified: Secondary | ICD-10-CM

## 2019-01-08 DIAGNOSIS — Z8701 Personal history of pneumonia (recurrent): Secondary | ICD-10-CM

## 2019-01-08 DIAGNOSIS — N183 Chronic kidney disease, stage 3 (moderate): Secondary | ICD-10-CM | POA: Diagnosis not present

## 2019-01-08 DIAGNOSIS — E1159 Type 2 diabetes mellitus with other circulatory complications: Secondary | ICD-10-CM | POA: Diagnosis not present

## 2019-01-08 DIAGNOSIS — I5032 Chronic diastolic (congestive) heart failure: Secondary | ICD-10-CM | POA: Diagnosis not present

## 2019-01-08 DIAGNOSIS — Z8673 Personal history of transient ischemic attack (TIA), and cerebral infarction without residual deficits: Secondary | ICD-10-CM

## 2019-01-08 DIAGNOSIS — I152 Hypertension secondary to endocrine disorders: Secondary | ICD-10-CM | POA: Diagnosis not present

## 2019-01-08 DIAGNOSIS — Z9181 History of falling: Secondary | ICD-10-CM

## 2019-01-08 DIAGNOSIS — Z87440 Personal history of urinary (tract) infections: Secondary | ICD-10-CM

## 2019-01-08 DIAGNOSIS — Z87891 Personal history of nicotine dependence: Secondary | ICD-10-CM

## 2019-01-08 DIAGNOSIS — E1122 Type 2 diabetes mellitus with diabetic chronic kidney disease: Secondary | ICD-10-CM | POA: Diagnosis not present

## 2019-01-08 DIAGNOSIS — D631 Anemia in chronic kidney disease: Secondary | ICD-10-CM | POA: Diagnosis not present

## 2019-01-08 DIAGNOSIS — I480 Paroxysmal atrial fibrillation: Secondary | ICD-10-CM | POA: Diagnosis not present

## 2019-01-11 ENCOUNTER — Other Ambulatory Visit: Payer: Self-pay | Admitting: Family Medicine

## 2019-01-21 ENCOUNTER — Other Ambulatory Visit: Payer: Self-pay | Admitting: Family Medicine

## 2019-02-07 ENCOUNTER — Other Ambulatory Visit: Payer: Self-pay | Admitting: Family Medicine

## 2019-02-10 ENCOUNTER — Telehealth: Payer: Self-pay | Admitting: Family Medicine

## 2019-02-10 ENCOUNTER — Other Ambulatory Visit: Payer: Self-pay

## 2019-02-10 NOTE — Telephone Encounter (Signed)
Pt's wife need to speak to nurse b/c pt need his BP medication refill she thinks but isn't sure because she threw the bottle away. Please call wife to discuss

## 2019-02-10 NOTE — Telephone Encounter (Signed)
See note

## 2019-02-11 NOTE — Telephone Encounter (Signed)
Patient wants a refill on Vitamin D 50000 units Please advise

## 2019-02-12 ENCOUNTER — Other Ambulatory Visit: Payer: Self-pay

## 2019-02-12 MED ORDER — VITAMIN D (ERGOCALCIFEROL) 1.25 MG (50000 UNIT) PO CAPS: 50000.0000 [IU] | ORAL_CAPSULE | ORAL | 0 refills | Status: DC

## 2019-02-12 NOTE — Telephone Encounter (Signed)
Ok with me. Please place any necessary orders. 

## 2019-02-12 NOTE — Telephone Encounter (Signed)
Rx sent to pharmacy   

## 2019-03-03 ENCOUNTER — Encounter: Payer: Self-pay | Admitting: Family Medicine

## 2019-03-03 ENCOUNTER — Other Ambulatory Visit: Payer: Self-pay | Admitting: Family Medicine

## 2019-03-03 DIAGNOSIS — F5101 Primary insomnia: Secondary | ICD-10-CM

## 2019-03-03 MED ORDER — ZOLPIDEM TARTRATE 10 MG PO TABS: 10.0000 mg | ORAL_TABLET | Freq: Every evening | ORAL | 5 refills | Status: DC | PRN

## 2019-03-03 NOTE — Telephone Encounter (Signed)
Please advise 

## 2019-03-03 NOTE — Telephone Encounter (Signed)
Copied from [place] [phone]. Topic: Quick Communication - Rx Refill/Question >> Mar 03, 2019 11:42 AM Yvette Rack wrote: Medication:  zolpidem (AMBIEN) 10 MG tablet   Has the patient contacted their pharmacy? yes   Preferred Pharmacy (with phone number or street name): [id number] - [place], [place] [phone] (Phone)  [phone] (Fax)  Agent: Please be advised that RX refills may take up to 3 business days. We ask that you follow-up with your pharmacy.

## 2019-03-05 ENCOUNTER — Other Ambulatory Visit: Payer: Self-pay

## 2019-03-05 MED ORDER — FERROUS SULFATE 325 (65 FE) MG PO TABS: 325.0000 mg | ORAL_TABLET | Freq: Every day | ORAL | 0 refills | Status: DC

## 2019-03-25 LAB — VITAMIN D 25 HYDROXY (VIT D DEFICIENCY, FRACTURES): Vit D, 25-Hydroxy: 81

## 2019-03-25 LAB — BASIC METABOLIC PANEL
BUN: 49 — AB (ref 4–21)
Creatinine: 3.4 — AB (ref 0.6–1.3)
Glucose: 179
Potassium: 4.2 (ref 3.4–5.3)
Sodium: 135 — AB (ref 137–147)

## 2019-03-25 LAB — CBC AND DIFFERENTIAL
HCT: 28 — AB (ref 41–53)
Hemoglobin: 9.6 — AB (ref 13.5–17.5)
Platelets: 201 (ref 150–399)
WBC: 6.9

## 2019-03-28 ENCOUNTER — Other Ambulatory Visit: Payer: Self-pay | Admitting: Family Medicine

## 2019-04-01 ENCOUNTER — Encounter: Payer: Self-pay | Admitting: Family Medicine

## 2019-04-10 ENCOUNTER — Other Ambulatory Visit: Payer: Self-pay | Admitting: Physician Assistant

## 2019-04-10 DIAGNOSIS — D485 Neoplasm of uncertain behavior of skin: Secondary | ICD-10-CM | POA: Diagnosis not present

## 2019-04-10 DIAGNOSIS — L720 Epidermal cyst: Secondary | ICD-10-CM | POA: Diagnosis not present

## 2019-04-10 DIAGNOSIS — L910 Hypertrophic scar: Secondary | ICD-10-CM | POA: Diagnosis not present

## 2019-04-13 ENCOUNTER — Other Ambulatory Visit: Payer: Self-pay | Admitting: Cardiology

## 2019-04-14 NOTE — Telephone Encounter (Signed)
99m 78kg Scr 3.4 03/25/19 ccr 23 Lovw/jordan 07/05/17

## 2019-05-05 ENCOUNTER — Other Ambulatory Visit: Payer: Self-pay | Admitting: Family Medicine

## 2019-05-06 ENCOUNTER — Encounter: Payer: Self-pay | Admitting: Family Medicine

## 2019-05-07 ENCOUNTER — Other Ambulatory Visit: Payer: Self-pay | Admitting: Cardiology

## 2019-05-07 DIAGNOSIS — Z20828 Contact with and (suspected) exposure to other viral communicable diseases: Secondary | ICD-10-CM | POA: Diagnosis not present

## 2019-05-08 NOTE — Telephone Encounter (Signed)
67 YOM, 78kg, SCr 3.4 (03/25/19), LOV 07/05/17

## 2019-05-10 ENCOUNTER — Encounter: Payer: Self-pay | Admitting: Internal Medicine

## 2019-05-25 ENCOUNTER — Other Ambulatory Visit: Payer: Self-pay

## 2019-05-25 ENCOUNTER — Ambulatory Visit (INDEPENDENT_AMBULATORY_CARE_PROVIDER_SITE_OTHER): Payer: BC Managed Care – PPO | Admitting: Physician Assistant

## 2019-05-25 ENCOUNTER — Encounter: Payer: Self-pay | Admitting: Physician Assistant

## 2019-05-25 VITALS — BP 167/76 | HR 54 | Temp 97.0°F | Ht 69.0 in | Wt 179.8 lb

## 2019-05-25 DIAGNOSIS — Z23 Encounter for immunization: Secondary | ICD-10-CM | POA: Diagnosis not present

## 2019-05-25 DIAGNOSIS — N184 Chronic kidney disease, stage 4 (severe): Secondary | ICD-10-CM

## 2019-05-25 DIAGNOSIS — E119 Type 2 diabetes mellitus without complications: Secondary | ICD-10-CM | POA: Diagnosis not present

## 2019-05-25 DIAGNOSIS — I4819 Other persistent atrial fibrillation: Secondary | ICD-10-CM

## 2019-05-25 DIAGNOSIS — I10 Essential (primary) hypertension: Secondary | ICD-10-CM

## 2019-05-25 NOTE — Progress Notes (Signed)
Cardiology Office Note    Date:  05/26/2019   ID:  Douglas Ballard, DOB 01-03-1951, MRN [id number]  PCP:  Vivi Barrack, MD  Cardiologist:  Dr. Martinique  Chief Complaint  Patient presents with   Follow-up    seen for Dr. Martinique.    History of Present Illness:  Douglas Ballard is a 68 y.o. male with PMH of hypertension, PAF, CKD, tobacco abuse, alcohol abuse, DM 2, and colovesical fistula.  He was admitted in August 2015 with acute renal failure, sepsis, expressive aphasia, diastolic heart failure in the setting of A. fib with RVR.  He also had alcohol withdrawal during the same hospitalization.  Echocardiogram demonstrated normal EF, moderate biatrial enlargement, mild LVH.  Myoview demonstrated normal perfusion.  He had recurrent atrial fibrillation in June 2017.  He was seen in the ED in June 2017 with chest pain and the hematuria.  D-dimer was negative, cardiac enzymes were negative.  Repeat Myoview showed normal perfusion, EF 50%.  Echocardiogram showed normal LV function.  He was converted to sinus rhythm at that time.  His last office visit with Dr. Martinique was in November 2018 at which time he was doing well.  He had acute on chronic diastolic heart failure in January 2020.  He was diuresed with IV Lasix.  Echocardiogram obtained at the time showed EF 55 to 60%.  He was eventually discharged on 40 mg daily of Lasix.  He was admitted again after a dog bite in April 2020.  He was found to be volume overloaded in the hospital and was given another dose of diuretic.  Creatinine trended up to 3.3 before trending back down again.  Given frequent heart failure, his Cardizem was cut back.   Patient presents today for cardiology office visit.  His blood pressure is elevated today, however normally his blood pressure is better controlled at home.  He denies any recent chest pain, shortness of breath, lower extremity edema, orthopnea or PND.   Past Medical History:  Diagnosis Date   Alcohol  withdrawal seizure ([place]) 06/28/2011   Alcohol withdraw seizure prior to admission is suspected from history given by family & Post ictal appearance in the ED.    Anxiety    Atrial fibrillation ([place])    Atypical nevus 08/30/2017   Mid Upper Back, Sup-Moderate and Right Chest-Severe   Atypical nevus x 2 05/13/2008   Left Medial Chest-Slight to Moderate and Left Lateral Chest-Slight   Atypical nevus x 2 04/29/2015   Right Post Shoulder-Moderate to Severe(w/s) and Right Mid Abdomen-Moderate(w/s)   Atypical nevus x 2 03/23/2016   Right Lower Back-Mild and Left Side Torso-Mild   Atypical nevus x 2 06/14/2016   Left Lower Back-Moderate to Severe, and Left Chest-Moderate   Atypical nevus x 2 07/25/2018   Left Shoulder Ant-Moderate to Severe(w/s) and Left Shoulder Post-Moderate   Atypical nevus x 4 09/27/2015   Left Upper Arm-Severe(w/s), Mid Lower back-Moderate, Mid Upper Back-Severe(Exc) and Left Upper Back-Moderate to Severe(Exc)   Cancer ([place]) 2012   Prostate surgery   Chronic diastolic CHF (congestive heart failure) ([place]) 11/01/2014   Echo 8/15: Mild LVH, EF 50-55%, moderate BAE  //  b. Echo 7/17: EF 55-60%, normal wall motion, grade 2 diastolic dysfunction, MAC, mild MR, moderate LAE, trivial PI   Compression fracture of thoracic vertebra ([place]) 06/26/2011   Diabetes mellitus type 2 in nonobese (HCC)    Fistula, bladder    Frequent urination at night    History of adenomatous  polyp of colon 06/14/2014   History of nuclear stress test    a. Myoview 10/15: Overall Impression: Low risk stress nuclear study demonstrating mild baseline ST-T changes with normal myocardial perfusion and low normal EF of 50%. // b.Myoview 7/17: EF 50%, no ischemia or scar, low risk (EF normal by recent echo)   Hypertension    PNA (pneumonia) 06/26/2011   Sepsis due to urinary tract infection ([place]) 04/16/2014   Stroke ([place]) sept 1, 2015   tia x 3    Past Surgical History:  Procedure  Laterality Date   COLON SURGERY     COLONOSCOPY N/A 06/14/2014   Procedure: COLONOSCOPY;  Surgeon: Gatha Mayer, MD;  Location: [facility];  Service: Endoscopy;  Laterality: N/A;   CYSTOSCOPY WITH STENT PLACEMENT Bilateral 06/15/2014   Procedure: CYSTOSCOPY WITH BILATERAL STENT PLACEMENT;  Surgeon: Bernestine Amass, MD;  Location: [facility];  Service: Urology;  Laterality: Bilateral;   LIPOMA EXCISION  2012   Dr Nonah Mattes   moles removed     Back   PROCTOSCOPY N/A 06/15/2014   Procedure: RIGID PROCTOSCOPY;  Surgeon: Michael Boston, MD;  Location: [facility];  Service: General;  Laterality: N/A;   RADIOLOGY WITH ANESTHESIA N/A 07/28/2014   Procedure: Embolization;  Surgeon: Luanne Bras, MD;  Location: [facility];  Service: Radiology;  Laterality: N/A;   ROBOT ASSISTED LAPAROSCOPIC RADICAL PROSTATECTOMY  01/31/2011   Robotic-assisted laparoscopic radical retropubic    Current Medications: Outpatient Medications Prior to Visit  Medication Sig Dispense Refill   cloNIDine (CATAPRES) 0.1 MG tablet Take 0.1 mg by mouth 2 (two) times daily.      diltiazem (CARDIZEM CD) 180 MG 24 hr capsule Take 1 capsule (180 mg total) by mouth every morning. 90 capsule 0   DM-Doxylamine-Acetaminophen (NYQUIL HBP COLD & FLU) 15-6.25-325 MG/15ML LIQD Take 30 mLs by mouth at bedtime as needed (cough/sleep).     ferrous sulfate 325 (65 FE) MG tablet TAKE 1 TABLET BY MOUTH EVERY DAY 30 tablet 0   furosemide (LASIX) 40 MG tablet TAKE 2 TABLETS BY MOUTH EVERY DAY 180 tablet 1   hydrOXYzine (ATARAX/VISTARIL) 50 MG tablet TAKE 1/2-2 TABLETS (25-100 MG TOTAL) BY MOUTH AT BEDTIME AS NEEDED FOR ANXIETY (INSOMNIA). 180 tablet 1   isosorbide-hydrALAZINE (BIDIL) 20-37.5 MG tablet Take 2 tablets by mouth 2 (two) times daily.      JANUVIA 100 MG tablet TAKE 1 TABLET BY MOUTH EVERY DAY 90 tablet 1   labetalol (NORMODYNE) 200 MG tablet Take 2 tablets (400 mg total) by mouth 2 (two) times daily.     Vitamin D,  Ergocalciferol, (DRISDOL) 1.25 MG (50000 UT) CAPS capsule Take 1 capsule (50,000 Units total) by mouth every Wednesday. 12 capsule 0   XARELTO 15 MG TABS tablet TAKE 1 TABLET DAILY WITH SUPPER (CONTACT CARDIOLOGIST OFFICE FOR APPOINTMENT PRIOR TO NEXT REFILL REQUEST) 90 tablet 0   zolpidem (AMBIEN) 10 MG tablet Take 1 tablet (10 mg total) by mouth at bedtime as needed. for sleep 30 tablet 5   No facility-administered medications prior to visit.      Allergies:   Hydrochlorothiazide, Lasix [furosemide], Nsaids, and Sulfa antibiotics   Social History   Socioeconomic History   Marital status: Married    Spouse name: Not on file   Number of children: 2   Years of education: Not on file   Highest education level: Not on file  Occupational History   Occupation: Press photographer and Proofreader  Social Needs  Financial resource strain: Not on file   Food insecurity    Worry: Not on file    Inability: Not on file   Transportation needs    Medical: Not on file    Non-medical: Not on file  Tobacco Use   Smoking status: Former Smoker    Packs/day: 2.00    Years: 25.00    Pack years: 50.00    Types: Cigarettes    Quit date: 04/14/2014    Years since quitting: 5.1   Smokeless tobacco: Never Used  Substance and Sexual Activity   Alcohol use: Yes    Alcohol/week: 7.0 - 10.0 standard drinks    Types: 7 - 10 Standard drinks or equivalent per week    Comment: 2 bottles of wine with wife a week; cocktail every other night   Drug use: No   Sexual activity: Not Currently  Lifestyle   Physical activity    Days per week: Not on file    Minutes per session: Not on file   Stress: Not on file  Relationships   Social connections    Talks on phone: Not on file    Gets together: Not on file    Attends religious service: Not on file    Active member of club or organization: Not on file    Attends meetings of clubs or organizations: Not on file    Relationship status: Not on  file  Other Topics Concern   Not on file  Social History Narrative   Fun/Hobby: Walk, cards, gardening      Family History:  The patient's family history includes Atrial fibrillation in his father; Breast cancer in his mother; Colon polyps in his brother; Leukemia in his brother; Lung cancer in his mother; Prostate cancer in his father.   ROS:   Please see the history of present illness.    ROS All other systems reviewed and are negative.   PHYSICAL EXAM:   VS:  BP (!) 167/76    Pulse (!) 54    Temp (!) 97 F (36.1 C)    Ht _0  (1.753 m)    Wt 179 lb 12.8 oz (81.6 kg)    SpO2 97%    BMI 26.55 kg/m    GEN: Well nourished, well developed, in no acute distress  HEENT: normal  Neck: no JVD, carotid bruits, or masses Cardiac: RRR; no murmurs, rubs, or gallops,no edema  Respiratory:  clear to auscultation bilaterally, normal work of breathing GI: soft, nontender, nondistended, + BS MS: no deformity or atrophy  Skin: warm and dry, no rash Neuro:  Alert and Oriented x 3, Strength and sensation are intact Psych: euthymic mood, full affect  Wt Readings from Last 3 Encounters:  05/25/19 179 lb 12.8 oz (81.6 kg)  12/04/18 172 lb (78 kg)  11/26/18 185 lb 3 oz (84 kg)      Studies/Labs Reviewed:   EKG:  EKG is ordered today.  This was reviewed personally.  EKG demonstrated sinus bradycardia, heart rate 48, poor R wave progression in the anterior leads.  Recent Labs: 11/25/2018: ALT 15; B Natriuretic Peptide 409.1 03/25/2019: BUN 49; Creatinine 3.4; Hemoglobin 9.6; Platelets 201; Potassium 4.2; Sodium 135   Lipid Panel    Component Value Date/Time   CHOL 113 05/28/2017 1107   TRIG 49.0 05/28/2017 1107   HDL 59.50 05/28/2017 1107   CHOLHDL 2 05/28/2017 1107   VLDL 9.8 05/28/2017 1107   LDLCALC 44 05/28/2017 1107    Additional studies/  records that were reviewed today include:   Echo 08/31/2018 LV EF: 55% -   60% Study Conclusions  - Left ventricle: The cavity size was  normal. There was mild   concentric hypertrophy. Systolic function was normal. The   estimated ejection fraction was in the range of 55% to 60%. Wall   motion was normal; there were no regional wall motion   abnormalities. Features are consistent with a pseudonormal left   ventricular filling pattern, with concomitant abnormal relaxation   and increased filling pressure (grade 2 diastolic dysfunction). - Aortic valve: There was trivial regurgitation. - Mitral valve: There was mild regurgitation. - Left atrium: The atrium was severely dilated. - Right atrium: The atrium was severely dilated. - Pulmonary arteries: PA peak pressure: 36 mm Hg (S). - Pericardium, extracardiac: A trivial pericardial effusion was   identified.    ASSESSMENT:    1. Persistent atrial fibrillation ([place])   2. Need for immunization against influenza   3. Essential hypertension   4. Controlled type 2 diabetes mellitus without complication, without long-term current use of insulin ([place])   5. Chronic kidney disease (CKD), stage IV (severe) (HCC)      PLAN:  In order of problems listed above:  1. Persistent atrial fibrillation: Continue current diltiazem therapy.  Maintaining sinus rhythm on today's EKG.  Heart rate bradycardic, however patient does not have any symptom of dizziness, blurred vision or symptom of passing out.  Typical blood pressure has been in the 130s-140s at home, I am hesitant to drop his blood pressure too much given the bradycardia to allow adequate perfusion of kidney.  Continue Xarelto  2. Hypertension: Blood pressure elevated today, normally his blood pressure has been in the 130s to 140s at home which is at goal.  I would avoid dropping his blood pressure too low given the bradycardia which can cause hypoperfusion to the kidney.  3. CKD stage IV: Managed by nephrology team  4. DM2: Managed by primary care provider    Medication Adjustments/Labs and Tests Ordered: Current medicines  are reviewed at length with the patient today.  Concerns regarding medicines are outlined above.  Medication changes, Labs and Tests ordered today are listed in the Patient Instructions below. Patient Instructions  Medication Instructions:  Your physician recommends that you continue on your current medications as directed. Please refer to the Current Medication list given to you today.   If you need a refill on your cardiac medications before your next appointment, please call your pharmacy.   Lab work: NONE ordered at this time of appointment   If you have labs (blood work) drawn today and your tests are completely normal, you will receive your results only by:  [place] (if you have MyChart) OR  A paper copy in the mail If you have any lab test that is abnormal or we need to change your treatment, we will call you to review the results.  Testing/Procedures: NONE ordered at this time of appointment   Follow-Up: At [facility], you and your health needs are our priority.  As part of our continuing mission to provide you with exceptional heart care, we have created designated Provider Care Teams.  These Care Teams include your primary Cardiologist (physician) and Advanced Practice Providers (APPs -  Physician Assistants and Nurse Practitioners) who all work together to provide you with the care you need, when you need it. You will need a follow up appointment in 12 months October 2021.  Please call  our office in July and/or August 2021 to schedule this appointment.  You may see Peter Martinique, MD or one of the following Advanced Practice Providers on your designated Care Team: Almyra Deforest, [place]  Fabian Sharp, PA-C  Any Other Special Instructions Will Be Listed Below (If Applicable).       Hilbert Corrigan, [place]  05/26/2019 11:59 PM    [facility] [place], [place], [place]  [address] Phone: [phone]; Fax: [phone]

## 2019-05-25 NOTE — Patient Instructions (Addendum)
Medication Instructions:  Your physician recommends that you continue on your current medications as directed. Please refer to the Current Medication list given to you today.   If you need a refill on your cardiac medications before your next appointment, please call your pharmacy.   Lab work: NONE ordered at this time of appointment   If you have labs (blood work) drawn today and your tests are completely normal, you will receive your results only by: [place] MyChart Message (if you have MyChart) OR . A paper copy in the mail If you have any lab test that is abnormal or we need to change your treatment, we will call you to review the results.  Testing/Procedures: NONE ordered at this time of appointment   Follow-Up: At [facility], you and your health needs are our priority.  As part of our continuing mission to provide you with exceptional heart care, we have created designated Provider Care Teams.  These Care Teams include your primary Cardiologist (physician) and Advanced Practice Providers (APPs -  Physician Assistants and Nurse Practitioners) who all work together to provide you with the care you need, when you need it. You will need a follow up appointment in 12 months October 2021.  Please call our office in July and/or August 2021 to schedule this appointment.  You may see Peter Martinique, MD or one of the following Advanced Practice Providers on your designated Care Team: [place], [place] . Fabian Sharp, PA-C  Any Other Special Instructions Will Be Listed Below (If Applicable).

## 2019-05-26 ENCOUNTER — Encounter: Payer: Self-pay | Admitting: Physician Assistant

## 2019-06-05 ENCOUNTER — Other Ambulatory Visit: Payer: Self-pay

## 2019-06-05 MED ORDER — FERROUS SULFATE 325 (65 FE) MG PO TABS: 325.0000 mg | ORAL_TABLET | Freq: Every day | ORAL | 2 refills | Status: DC

## 2019-06-09 ENCOUNTER — Other Ambulatory Visit: Payer: Self-pay | Admitting: Family Medicine

## 2019-06-15 ENCOUNTER — Other Ambulatory Visit: Payer: Self-pay

## 2019-06-15 MED ORDER — SITAGLIPTIN PHOSPHATE 100 MG PO TABS: 100.0000 mg | ORAL_TABLET | Freq: Every day | ORAL | 1 refills | Status: DC

## 2019-06-29 ENCOUNTER — Other Ambulatory Visit: Payer: Self-pay

## 2019-06-29 ENCOUNTER — Ambulatory Visit (INDEPENDENT_AMBULATORY_CARE_PROVIDER_SITE_OTHER): Payer: BC Managed Care – PPO | Admitting: Family Medicine

## 2019-06-29 ENCOUNTER — Encounter: Payer: Self-pay | Admitting: Family Medicine

## 2019-06-29 VITALS — BP 130/62 | HR 62 | Temp 98.0°F | Ht 69.0 in | Wt 175.2 lb

## 2019-06-29 DIAGNOSIS — N183 Chronic kidney disease, stage 3 unspecified: Secondary | ICD-10-CM | POA: Diagnosis not present

## 2019-06-29 DIAGNOSIS — E1159 Type 2 diabetes mellitus with other circulatory complications: Secondary | ICD-10-CM | POA: Diagnosis not present

## 2019-06-29 DIAGNOSIS — N189 Chronic kidney disease, unspecified: Secondary | ICD-10-CM

## 2019-06-29 DIAGNOSIS — I10 Essential (primary) hypertension: Secondary | ICD-10-CM | POA: Diagnosis not present

## 2019-06-29 DIAGNOSIS — Z6825 Body mass index (BMI) 25.0-25.9, adult: Secondary | ICD-10-CM

## 2019-06-29 DIAGNOSIS — Z0001 Encounter for general adult medical examination with abnormal findings: Secondary | ICD-10-CM

## 2019-06-29 DIAGNOSIS — D631 Anemia in chronic kidney disease: Secondary | ICD-10-CM | POA: Diagnosis not present

## 2019-06-29 DIAGNOSIS — R5383 Other fatigue: Secondary | ICD-10-CM

## 2019-06-29 DIAGNOSIS — I152 Hypertension secondary to endocrine disorders: Secondary | ICD-10-CM

## 2019-06-29 DIAGNOSIS — E1122 Type 2 diabetes mellitus with diabetic chronic kidney disease: Secondary | ICD-10-CM

## 2019-06-29 LAB — TSH: TSH: 1.45 u[IU]/mL (ref 0.35–4.50)

## 2019-06-29 LAB — IBC + FERRITIN
Ferritin: 71.1 ng/mL (ref 22.0–322.0)
Iron: 62 ug/dL (ref 42–165)
Saturation Ratios: 25 % (ref 20.0–50.0)
Transferrin: 177 mg/dL — ABNORMAL LOW (ref 212.0–360.0)

## 2019-06-29 LAB — HEMOGLOBIN A1C: Hgb A1c MFr Bld: 7 % — ABNORMAL HIGH (ref 4.6–6.5)

## 2019-06-29 NOTE — Patient Instructions (Signed)
It was very nice to see you today!  Please reduce your clonidine to 1 pill daily.  We will check blood work today.  Please come back to see me in 1 year for your next physical.  Come back to see me sooner if needed.  Take care, Dr [name]  Please try these tips to maintain a healthy lifestyle:   Eat at least 3 REAL meals and 1-2 snacks per day.  Aim for no more than 5 hours between eating.  If you eat breakfast, please do so within one hour of getting up.    Obtain twice as many fruits/vegetables as protein or carbohydrate foods for both lunch and dinner. (Half of each meal should be fruits/vegetables, one quarter protein, and one quarter starchy carbs)   Cut down on sweet beverages. This includes juice, soda, and sweet tea.    Exercise at least 150 minutes every week.    Preventive Care 75 Years and Older, Male Preventive care refers to lifestyle choices and visits with your health care provider that can promote health and wellness. This includes:  A yearly physical exam. This is also called an annual well check.  Regular dental and eye exams.  Immunizations.  Screening for certain conditions.  Healthy lifestyle choices, such as diet and exercise. What can I expect for my preventive care visit? Physical exam Your health care provider will check:  Height and weight. These may be used to calculate body mass index (BMI), which is a measurement that tells if you are at a healthy weight.  Heart rate and blood pressure.  Your skin for abnormal spots. Counseling Your health care provider may ask you questions about:  Alcohol, tobacco, and drug use.  Emotional well-being.  Home and relationship well-being.  Sexual activity.  Eating habits.  History of falls.  Memory and ability to understand (cognition).  Work and work Statistician. What immunizations do I need?  Influenza (flu) vaccine  This is recommended every year. Tetanus, diphtheria, and pertussis  (Tdap) vaccine  You may need a Td booster every 10 years. Varicella (chickenpox) vaccine  You may need this vaccine if you have not already been vaccinated. Zoster (shingles) vaccine  You may need this after age 41. Pneumococcal conjugate (PCV13) vaccine  One dose is recommended after age 27. Pneumococcal polysaccharide (PPSV23) vaccine  One dose is recommended after age 44. Measles, mumps, and rubella (MMR) vaccine  You may need at least one dose of MMR if you were born in 1957 or later. You may also need a second dose. Meningococcal conjugate (MenACWY) vaccine  You may need this if you have certain conditions. Hepatitis A vaccine  You may need this if you have certain conditions or if you travel or work in places where you may be exposed to hepatitis A. Hepatitis B vaccine  You may need this if you have certain conditions or if you travel or work in places where you may be exposed to hepatitis B. Haemophilus influenzae type b (Hib) vaccine  You may need this if you have certain conditions. You may receive vaccines as individual doses or as more than one vaccine together in one shot (combination vaccines). Talk with your health care provider about the risks and benefits of combination vaccines. What tests do I need? Blood tests  Lipid and cholesterol levels. These may be checked every 5 years, or more frequently depending on your overall health.  Hepatitis C test.  Hepatitis B test. Screening  Lung cancer screening. You  may have this screening every year starting at age 32 if you have a 30-pack-year history of smoking and currently smoke or have quit within the past 15 years.  Colorectal cancer screening. All adults should have this screening starting at age 31 and continuing until age 55. Your health care provider may recommend screening at age 55 if you are at increased risk. You will have tests every 1-10 years, depending on your results and the type of screening test.   Prostate cancer screening. Recommendations will vary depending on your family history and other risks.  Diabetes screening. This is done by checking your blood sugar (glucose) after you have not eaten for a while (fasting). You may have this done every 1-3 years.  Abdominal aortic aneurysm (AAA) screening. You may need this if you are a current or former smoker.  Sexually transmitted disease (STD) testing. Follow these instructions at home: Eating and drinking  Eat a diet that includes fresh fruits and vegetables, whole grains, lean protein, and low-fat dairy products. Limit your intake of foods with high amounts of sugar, saturated fats, and salt.  Take vitamin and mineral supplements as recommended by your health care provider.  Do not drink alcohol if your health care provider tells you not to drink.  If you drink alcohol: ? Limit how much you have to 0-2 drinks a day. ? Be aware of how much alcohol is in your drink. In the [place] one drink equals one 12 oz bottle of beer (355 mL), one 5 oz glass of wine (148 mL), or one 1 oz glass of hard liquor (44 mL). Lifestyle  Take daily care of your teeth and gums.  Stay active. Exercise for at least 30 minutes on 5 or more days each week.  Do not use any products that contain nicotine or tobacco, such as cigarettes, e-cigarettes, and chewing tobacco. If you need help quitting, ask your health care provider.  If you are sexually active, practice safe sex. Use a condom or other form of protection to prevent STIs (sexually transmitted infections).  Talk with your health care provider about taking a low-dose aspirin or statin. What's next?  Visit your health care provider once a year for a well check visit.  Ask your health care provider how often you should have your eyes and teeth checked.  Stay up to date on all vaccines. This information is not intended to replace advice given to you by your health care provider. Make sure you  discuss any questions you have with your health care provider. Document Released: 09/02/2015 Document Revised: 07/31/2018 Document Reviewed: 07/31/2018 Elsevier Patient Education  2020 [employer].

## 2019-06-29 NOTE — Progress Notes (Signed)
Chief Complaint:  Douglas Ballard is a 68 y.o. male who presents today for his annual comprehensive physical exam and subsequent medicare annual wellness visit.    Assessment/Plan:  Hypertension associated with diabetes ([place]) At goal.  We will decrease clonidine due to fatigue to 0.1 mg once daily.  Continue Cardizem 180 mg daily, BiDil 2 tablets twice daily, and labetalol 400 mg 3 times daily.  Anemia due to chronic kidney disease Check iron panel today.  Type 2 diabetes mellitus with stage 3 chronic kidney disease, without long-term current use of insulin (HCC) Check A1c.  Continue Januvia 100 mg daily.  CKD (chronic kidney disease) stage 3, GFR 30-59 ml/min (HCC) Continue management per nephrology.  Fatigue Likely multifactorial in setting of anemia, CKD, CHF, and sedating medications.  We will check TSH and iron level today.  Will decrease clonidine to 0.1 mg once daily.  Body mass index is 25.88 kg/m. / Overweight BMI Metric Follow Up - 06/29/19 1406      BMI Metric Follow Up-Please document annually   BMI Metric Follow Up  Education provided        Preventative Healthcare: Up-to-date on flu vaccine and pneumonia vaccine.  Due for colonoscopy 2025. Follows with urology for prostate cancer screening.   Patient Counseling(The following topics were reviewed and/or handout was given):  -Nutrition: Stressed importance of moderation in sodium/caffeine intake, saturated fat and cholesterol, caloric balance, sufficient intake of fresh fruits, vegetables, and fiber.  -Stressed the importance of regular exercise.   -Substance Abuse: Discussed cessation/primary prevention of tobacco, alcohol, or other drug use; driving or other dangerous activities under the influence; availability of treatment for abuse.   -Injury prevention: Discussed safety belts, safety helmets, smoke detector, smoking near bedding or upholstery.   -Sexuality: Discussed sexually transmitted diseases, partner  selection, use of condoms, avoidance of unintended pregnancy and contraceptive alternatives.   -Dental health: Discussed importance of regular tooth brushing, flossing, and dental visits.  -Health maintenance and immunizations reviewed. Please refer to Health maintenance section.  During the course of the visit the patient was educated and counseled about appropriate screening and preventive services including:        Fall prevention   Nutrition Physical Activity Weight Management Cognition  Return to care in 1 year for next preventative visit.     Subjective:  HPI:  He has had some issues with fatigue over the last several weeks.  Feels like he has decreased exercise capacity.  No shortness of breath.  Has small amount of energy just becomes fatigued quicker.  No leg swelling.  No chest pain.  Has been following with his nephrologist who was told him that he is low in hemoglobin.  Health Risk Assessment: Patient considers his overall health to be good. He has no difficulty performing the following: . Preparing food and eating . Bathing  . Getting dressed . Using the toilet . Shopping . Managing Finances . Moving around from place to place  He has not had any falls within the past year.   Depression screen PHQ 2/9 06/29/2019  Decreased Interest 0  Down, Depressed, Hopeless 0  PHQ - 2 Score 0  Altered sleeping 0  Tired, decreased energy 0  Change in appetite 0  Feeling bad or failure about yourself  0  Trouble concentrating 0  Moving slowly or fidgety/restless 0  Suicidal thoughts 0  PHQ-9 Score 0   Lifestyle Factors: Diet: No specific diets or eating plans. Plenty of fruits and vegetables.  Exercise: Likes to walk dog.   Patient Care Team: Vivi Barrack, MD as PCP - General (Family Medicine) [place], Peter M, MD as PCP - Cardiology (Cardiology) Rana Snare, MD as Consulting Physician (Urology) Michael Boston, MD as Consulting Physician (General Surgery) Susa Day, MD as Consulting Physician (Orthopedic Surgery) Gatha Mayer, MD as Consulting Physician (Gastroenterology) Nwobu, Lyman Bishop, MD as Consulting Physician (Nephrology)   His chronic medical conditions are outlined below:  ROS: Per HPI, otherwise a complete review of systems was negative.   PMH:  The following were reviewed and entered/updated in epic: Past Medical History:  Diagnosis Date  . 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   Marland Kitchen Anxiety   . Atrial fibrillation ([place])   . Atypical nevus 08/30/2017   Mid Upper Back, Sup-Moderate and Right Chest-Severe  . Atypical nevus x 2 05/13/2008   Left Medial Chest-Slight to Moderate and Left Lateral Chest-Slight  . Atypical nevus x 2 04/29/2015   Right Post Shoulder-Moderate to Severe(w/s) and Right Mid Abdomen-Moderate(w/s)  . Atypical nevus x 2 03/23/2016   Right Lower Back-Mild and Left Side Torso-Mild  . Atypical nevus x 2 06/14/2016   Left Lower Back-Moderate to Severe, and Left Chest-Moderate  . Atypical nevus x 2 07/25/2018   Left Shoulder Ant-Moderate to Severe(w/s) and Left Shoulder Post-Moderate  . Atypical nevus x 4 09/27/2015   Left Upper Arm-Severe(w/s), Mid Lower back-Moderate, Mid Upper Back-Severe(Exc) and Left Upper Back-Moderate to Severe(Exc)  . Cancer [facility]) 2012   Prostate surgery  . Chronic diastolic CHF (congestive heart failure) ([place]) 11/01/2014   Echo 8/15: Mild LVH, EF 50-55%, moderate BAE  //  b. Echo 7/17: EF 55-60%, normal wall motion, grade 2 diastolic dysfunction, MAC, mild MR, moderate LAE, trivial PI  . Compression fracture of thoracic vertebra ([place]) 06/26/2011  . Diabetes mellitus type 2 in nonobese (HCC)   . Fistula, bladder   . Frequent urination at night   . History of adenomatous polyp of colon 06/14/2014  . History of nuclear stress test    a. Myoview 10/15:  Overall Impression: Low risk stress nuclear study demonstrating mild baseline ST-T changes with normal myocardial perfusion and low normal EF of 50%. // b.Myoview 7/17: EF 50%, no ischemia or scar, low risk (EF normal by recent echo)  . Hypertension   . PNA (pneumonia) 06/26/2011  . Sepsis due to urinary tract infection ([place]) 04/16/2014  . Stroke ([place]) sept 1, 2015   tia x 3   Patient Active Problem List   Diagnosis Date Noted  . Anemia due to chronic kidney disease 08/31/2018  . Stress 08/01/2018  . Type 2 diabetes mellitus with stage 3 chronic kidney disease, without long-term current use of insulin ([place]) 09/06/2017  . CKD (chronic kidney disease) stage 3, GFR 30-59 ml/min (HCC) 02/07/2016  . Insomnia 09/19/2015  . Coarse tremors 09/19/2015  . Chronic diastolic CHF (congestive heart failure) ([place]) 11/01/2014  . History of adenomatous polyp of colon 06/14/2014  . Paroxysmal atrial fibrillation ([place]) 05/19/2014  . Elevated bilirubin 04/14/2014  . 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 Hypertension associated with diabetes ([place]) 06/26/2011  . Personal history of prostate cancer s/p prostatectomy 2012 06/26/2011   Past Surgical History:  Procedure Laterality Date  . COLON SURGERY    . COLONOSCOPY N/A 06/14/2014   Procedure: COLONOSCOPY;  Surgeon: Gatha Mayer,  MD;  Location: [facility];  Service: Endoscopy;  Laterality: N/A;  . CYSTOSCOPY WITH STENT PLACEMENT Bilateral 06/15/2014   Procedure: CYSTOSCOPY WITH BILATERAL STENT PLACEMENT;  Surgeon: Bernestine Amass, MD;  Location: [facility] ORS;  Service: Urology;  Laterality: Bilateral;  . LIPOMA EXCISION  2012   Dr Nonah Mattes  . moles removed     Back  . PROCTOSCOPY N/A 06/15/2014   Procedure: RIGID PROCTOSCOPY;  Surgeon: Michael Boston, MD;  Location: [facility];  Service: General;  Laterality: N/A;  . RADIOLOGY WITH ANESTHESIA N/A 07/28/2014   Procedure: Embolization;  Surgeon: Luanne Bras, MD;  Location: [place];   Service: Radiology;  Laterality: N/A;  . ROBOT ASSISTED LAPAROSCOPIC RADICAL PROSTATECTOMY  01/31/2011   Robotic-assisted laparoscopic radical retropubic    Family History  Problem Relation Age of Onset  . Atrial fibrillation Father        onset 55s. Had pacemaker placed for sinus pause  . Prostate cancer Father   . Breast cancer Mother   . Lung cancer Mother   . Leukemia Brother   . Colon polyps Brother   . Colon cancer Neg Hx   . Diabetes Neg Hx     Medications- reviewed and updated Current Outpatient Medications  Medication Sig Dispense Refill  . cloNIDine (CATAPRES) 0.1 MG tablet Take 0.1 mg by mouth daily.    Marland Kitchen diltiazem (CARDIZEM CD) 180 MG 24 hr capsule Take 1 capsule (180 mg total) by mouth every morning. 90 capsule 0  . DM-Doxylamine-Acetaminophen (NYQUIL HBP COLD & FLU) 15-6.25-325 MG/15ML LIQD Take 30 mLs by mouth at bedtime as needed (cough/sleep).    . ferrous sulfate 325 (65 FE) MG tablet Take 1 tablet (325 mg total) by mouth daily. 30 tablet 2  . furosemide (LASIX) 40 MG tablet TAKE 2 TABLETS BY MOUTH EVERY DAY 180 tablet 1  . hydrOXYzine (ATARAX/VISTARIL) 50 MG tablet TAKE 1/2-2 TABLETS BY MOUTH AT BEDTIME AS NEEDED FOR ANXIETY (INSOMNIA). 180 tablet 1  . isosorbide-hydrALAZINE (BIDIL) 20-37.5 MG tablet Take 2 tablets by mouth 2 (two) times daily.     Marland Kitchen labetalol (NORMODYNE) 200 MG tablet Take 2 tablets (400 mg total) by mouth 2 (two) times daily.    . sitaGLIPtin (JANUVIA) 100 MG tablet Take 1 tablet (100 mg total) by mouth daily. 90 tablet 1  . Vitamin D, Ergocalciferol, (DRISDOL) 1.25 MG (50000 UT) CAPS capsule Take 1 capsule (50,000 Units total) by mouth every Wednesday. 12 capsule 0  . XARELTO 15 MG TABS tablet TAKE 1 TABLET DAILY WITH SUPPER (CONTACT CARDIOLOGIST OFFICE FOR APPOINTMENT PRIOR TO NEXT REFILL REQUEST) 90 tablet 0  . zolpidem (AMBIEN) 10 MG tablet Take 1 tablet (10 mg total) by mouth at bedtime as needed. for sleep 30 tablet 5   No current  facility-administered medications for this visit.     Allergies-reviewed and updated Allergies  Allergen Reactions  . Hydrochlorothiazide Other (See Comments)    Pt gets hyponatremia  . Lasix [Furosemide] Other (See Comments)    Sodium levels drop when take  . Nsaids Other (See Comments)    Kidney disease/failure  . Sulfa Antibiotics Other (See Comments)    headaches    Social History   Socioeconomic History  . Marital status: Married    Spouse name: Not on file  . Number of children: 2  . Years of education: Not on file  . Highest education level: Not on file  Occupational History  . Occupation: TEFL teacher  Social Needs  .  Financial resource strain: Not on file  . Food insecurity    Worry: Not on file    Inability: Not on file  . Transportation needs    Medical: Not on file    Non-medical: Not on file  Tobacco Use  . Smoking status: Former Smoker    Packs/day: 2.00    Years: 25.00    Pack years: 50.00    Types: Cigarettes    Quit date: 04/14/2014    Years since quitting: 5.2  . Smokeless tobacco: Never Used  Substance and Sexual Activity  . Alcohol use: Yes    Alcohol/week: 7.0 - 10.0 standard drinks    Types: 7 - 10 Standard drinks or equivalent per week    Comment: 2 bottles of wine with wife a week; cocktail every other night  . Drug use: No  . Sexual activity: Not Currently  Lifestyle  . Physical activity    Days per week: Not on file    Minutes per session: Not on file  . Stress: Not on file  Relationships  . Social Herbalist on phone: Not on file    Gets together: Not on file    Attends religious service: Not on file    Active member of club or organization: Not on file    Attends meetings of clubs or organizations: Not on file    Relationship status: Not on file  Other Topics Concern  . Not on file  Social History Narrative   Fun/Hobby: Walk, cards, gardening         Objective:  Physical Exam: BP 130/62    Pulse 62   Temp 98 F (36.7 C)   Ht _0  (1.753 m)   Wt 175 lb 4 oz (79.5 kg)   SpO2 98%   BMI 25.88 kg/m   Body mass index is 25.88 kg/m. Wt Readings from Last 3 Encounters:  06/29/19 175 lb 4 oz (79.5 kg)  05/25/19 179 lb 12.8 oz (81.6 kg)  12/04/18 172 lb (78 kg)  Gen: NAD, resting comfortably HEENT: TMs normal bilaterally. OP clear. No thyromegaly noted.  CV: Irregular with no murmurs appreciated Pulm: NWOB, CTAB with no crackles, wheezes, or rhonchi GI: Normal bowel sounds present. Soft, Nontender, Nondistended. MSK: no edema, cyanosis, or clubbing noted Skin: warm, dry Neuro: CN2-12 grossly intact. Strength 5/5 in upper and lower extremities. Reflexes symmetric and intact bilaterally. Normal minicog.  Psych: Normal affect and thought content     Caleb M. [name], MD 06/29/2019 2:09 PM

## 2019-06-29 NOTE — Assessment & Plan Note (Signed)
Check iron panel today.

## 2019-06-29 NOTE — Assessment & Plan Note (Signed)
At goal.  We will decrease clonidine due to fatigue to 0.1 mg once daily.  Continue Cardizem 180 mg daily, BiDil 2 tablets twice daily, and labetalol 400 mg 3 times daily.

## 2019-06-29 NOTE — Assessment & Plan Note (Signed)
Check A1c.  Continue Januvia 100 mg daily. 

## 2019-06-29 NOTE — Assessment & Plan Note (Signed)
Continue management per nephrology

## 2019-06-30 NOTE — Progress Notes (Signed)
Please inform patient of the following:  Iron levels and thyroid levels are NORMAL. No clear explanation for his fatigue on his labs - would like for him to cut back on clonidine and let us know if symptoms not improving.   His blood sugar is up slightly but still at goal.Would like for him to continue [place] 100mg  daily and we can recheck in 3-6 months.

## 2019-07-13 ENCOUNTER — Other Ambulatory Visit: Payer: Self-pay | Admitting: Family Medicine

## 2019-08-20 ENCOUNTER — Other Ambulatory Visit: Payer: Self-pay | Admitting: Cardiology

## 2019-08-22 ENCOUNTER — Encounter: Payer: Self-pay | Admitting: Family Medicine

## 2019-08-25 ENCOUNTER — Other Ambulatory Visit: Payer: Self-pay | Admitting: Physician Assistant

## 2019-08-25 DIAGNOSIS — D485 Neoplasm of uncertain behavior of skin: Secondary | ICD-10-CM | POA: Diagnosis not present

## 2019-08-25 DIAGNOSIS — R202 Paresthesia of skin: Secondary | ICD-10-CM | POA: Diagnosis not present

## 2019-08-25 DIAGNOSIS — D225 Melanocytic nevi of trunk: Secondary | ICD-10-CM | POA: Diagnosis not present

## 2019-08-31 DIAGNOSIS — N184 Chronic kidney disease, stage 4 (severe): Secondary | ICD-10-CM | POA: Diagnosis not present

## 2019-09-01 DIAGNOSIS — E1122 Type 2 diabetes mellitus with diabetic chronic kidney disease: Secondary | ICD-10-CM | POA: Diagnosis not present

## 2019-09-01 DIAGNOSIS — D631 Anemia in chronic kidney disease: Secondary | ICD-10-CM | POA: Diagnosis not present

## 2019-09-01 DIAGNOSIS — N184 Chronic kidney disease, stage 4 (severe): Secondary | ICD-10-CM | POA: Diagnosis not present

## 2019-09-01 DIAGNOSIS — N179 Acute kidney failure, unspecified: Secondary | ICD-10-CM | POA: Diagnosis not present

## 2019-09-01 DIAGNOSIS — I129 Hypertensive chronic kidney disease with stage 1 through stage 4 chronic kidney disease, or unspecified chronic kidney disease: Secondary | ICD-10-CM | POA: Diagnosis not present

## 2019-09-15 ENCOUNTER — Encounter: Payer: Self-pay | Admitting: Family Medicine

## 2019-09-15 DIAGNOSIS — N184 Chronic kidney disease, stage 4 (severe): Secondary | ICD-10-CM | POA: Diagnosis not present

## 2019-09-15 DIAGNOSIS — D631 Anemia in chronic kidney disease: Secondary | ICD-10-CM | POA: Diagnosis not present

## 2019-09-22 ENCOUNTER — Other Ambulatory Visit: Payer: Self-pay | Admitting: Family Medicine

## 2019-09-22 DIAGNOSIS — F5101 Primary insomnia: Secondary | ICD-10-CM

## 2019-09-23 NOTE — Telephone Encounter (Signed)
Rx request 

## 2019-09-28 ENCOUNTER — Ambulatory Visit: Payer: Medicare HMO | Attending: Internal Medicine

## 2019-09-28 DIAGNOSIS — Z23 Encounter for immunization: Secondary | ICD-10-CM | POA: Insufficient documentation

## 2019-09-28 NOTE — Progress Notes (Signed)
   Covid-19 Vaccination Clinic  Name:  Douglas Ballard    MRN: [id number] DOB: 27-Aug-1950  09/28/2019  [name] was observed post Covid-19 immunization for 15 minutes without incidence. He was provided with Vaccine Information Sheet and instruction to access the V-Safe system.   [name] was instructed to call 911 with any severe reactions post vaccine: Marland Kitchen Difficulty breathing  . Swelling of your face and throat  . A fast heartbeat  . A bad rash all over your body  . Dizziness and weakness    Immunizations Administered    Name Date Dose VIS Date Route   Pfizer COVID-19 Vaccine 09/28/2019  5:11 PM 0.3 mL 07/31/2019 Intramuscular   Manufacturer: [place]   Lot: [id number]   [place]: [id number]

## 2019-10-12 DIAGNOSIS — N184 Chronic kidney disease, stage 4 (severe): Secondary | ICD-10-CM | POA: Diagnosis not present

## 2019-10-13 DIAGNOSIS — N184 Chronic kidney disease, stage 4 (severe): Secondary | ICD-10-CM | POA: Diagnosis not present

## 2019-10-13 DIAGNOSIS — I129 Hypertensive chronic kidney disease with stage 1 through stage 4 chronic kidney disease, or unspecified chronic kidney disease: Secondary | ICD-10-CM | POA: Diagnosis not present

## 2019-10-13 DIAGNOSIS — E1122 Type 2 diabetes mellitus with diabetic chronic kidney disease: Secondary | ICD-10-CM | POA: Diagnosis not present

## 2019-10-13 DIAGNOSIS — D631 Anemia in chronic kidney disease: Secondary | ICD-10-CM | POA: Diagnosis not present

## 2019-10-23 ENCOUNTER — Ambulatory Visit: Payer: Medicare HMO | Attending: Internal Medicine

## 2019-10-23 DIAGNOSIS — Z23 Encounter for immunization: Secondary | ICD-10-CM | POA: Insufficient documentation

## 2019-10-23 NOTE — Progress Notes (Signed)
   Covid-19 Vaccination Clinic  Name:  Douglas Ballard    MRN: [id number] DOB: 07-Dec-1950  10/23/2019  [name] was observed post Covid-19 immunization for 15 minutes without incident. He was provided with Vaccine Information Sheet and instruction to access the V-Safe system.   [name] was instructed to call 911 with any severe reactions post vaccine: Marland Kitchen Difficulty breathing  . Swelling of face and throat  . A fast heartbeat  . A bad rash all over body  . Dizziness and weakness   Immunizations Administered    Name Date Dose VIS Date Route   Pfizer COVID-19 Vaccine 10/23/2019  3:07 PM 0.3 mL 07/31/2019 Intramuscular   Manufacturer: [place]   Lot: [id number]   [place]: [id number]

## 2019-10-26 ENCOUNTER — Encounter: Payer: Self-pay | Admitting: Family Medicine

## 2019-11-10 DIAGNOSIS — N184 Chronic kidney disease, stage 4 (severe): Secondary | ICD-10-CM | POA: Diagnosis not present

## 2019-11-10 DIAGNOSIS — D631 Anemia in chronic kidney disease: Secondary | ICD-10-CM | POA: Diagnosis not present

## 2019-11-12 ENCOUNTER — Other Ambulatory Visit: Payer: Self-pay | Admitting: *Deleted

## 2019-11-12 MED ORDER — TRIAMCINOLONE ACETONIDE 0.1 % EX CREA: TOPICAL_CREAM | Freq: Two times a day (BID) | CUTANEOUS | 6 refills | Status: AC

## 2019-11-13 ENCOUNTER — Telehealth: Payer: Self-pay | Admitting: *Deleted

## 2019-11-13 DIAGNOSIS — F5101 Primary insomnia: Secondary | ICD-10-CM

## 2019-11-13 MED ORDER — ZOLPIDEM TARTRATE 10 MG PO TABS: 10.0000 mg | ORAL_TABLET | Freq: Every evening | ORAL | 5 refills | Status: DC | PRN

## 2019-11-13 NOTE — Telephone Encounter (Signed)
LAST APPOINTMENT DATE: 06/29/2019  NEXT APPOINTMENT DATE:@Visit  date not found  Rx Ambien 10mg   LAST REFILL:09/23/2019  QTY:30 5 Rf

## 2019-11-16 ENCOUNTER — Other Ambulatory Visit: Payer: Self-pay | Admitting: *Deleted

## 2019-11-16 MED ORDER — SITAGLIPTIN PHOSPHATE 100 MG PO TABS: 100.0000 mg | ORAL_TABLET | Freq: Every day | ORAL | 1 refills | Status: DC

## 2019-11-17 ENCOUNTER — Other Ambulatory Visit: Payer: Self-pay | Admitting: Family Medicine

## 2019-11-25 ENCOUNTER — Other Ambulatory Visit: Payer: Self-pay | Admitting: *Deleted

## 2019-11-27 ENCOUNTER — Encounter: Payer: Self-pay | Admitting: Physician Assistant

## 2019-11-27 ENCOUNTER — Other Ambulatory Visit: Payer: Self-pay

## 2019-11-27 ENCOUNTER — Ambulatory Visit: Payer: Medicare HMO | Admitting: Physician Assistant

## 2019-11-27 DIAGNOSIS — Z1283 Encounter for screening for malignant neoplasm of skin: Secondary | ICD-10-CM | POA: Diagnosis not present

## 2019-11-27 DIAGNOSIS — D2261 Melanocytic nevi of right upper limb, including shoulder: Secondary | ICD-10-CM | POA: Diagnosis not present

## 2019-11-27 DIAGNOSIS — D225 Melanocytic nevi of trunk: Secondary | ICD-10-CM | POA: Diagnosis not present

## 2019-11-27 DIAGNOSIS — D492 Neoplasm of unspecified behavior of bone, soft tissue, and skin: Secondary | ICD-10-CM

## 2019-11-27 DIAGNOSIS — L820 Inflamed seborrheic keratosis: Secondary | ICD-10-CM

## 2019-11-27 NOTE — Progress Notes (Addendum)
Follow-Up Visit   Subjective  Douglas Ballard is a 69 y.o. male who presents for the following: Skin Problem (States his wife has multiple places circled across his back.  History of itching.). History of DN. No H/O NMSC.   The following portions of the chart were reviewed this encounter and updated as appropriate: Tobacco  Allergies  Meds  Problems  Med Hx  Surg Hx  Fam Hx      Objective  Well appearing patient in no apparent distress; mood and affect are within normal limits.  A full examination was performed including scalp, head, eyes, ears, nose, lips, neck, chest, axillae, abdomen, back, buttocks, bilateral upper extremities, bilateral lower extremities, hands, feet, fingers, toes, fingernails, and toenails. All findings within normal limits unless otherwise noted below.  Objective  head to toe: All scars clear  Objective  Right Shoulder - Anterior: Bichromic dark nested macule.      Objective  Right Upper Back: Bichromic dark nested macule.     Objective  Left mid back: Bichromic dark nested macule.      Objective  Left Preauricular Area, Left Temple (2), Right Forehead, Right Temple, Right Upper Back: Stuck-on, waxy papules and plaques.   Assessment & Plan  Screening exam for skin cancer head to toe  Yearly skin exams  Neoplasm of skin (3) Right Shoulder - Anterior  Epidermal / dermal shaving  Lesion diameter (cm):  1 Informed consent: discussed and consent obtained   Timeout: patient name, date of birth, surgical site, and procedure verified   Procedure prep:  Patient was prepped and draped in usual sterile fashion Prep type:  Chlorhexidine Anesthesia: the lesion was anesthetized in a standard fashion   Anesthetic:  1% lidocaine w/ epinephrine 1-100,000 local infiltration Instrument used: DermaBlade   Hemostasis achieved with: aluminum chloride   Outcome: patient tolerated procedure well   Post-procedure details: sterile dressing  applied and wound care instructions given   Dressing type: petrolatum gauze, petrolatum and bandage    Specimen 1 - Surgical pathology Differential Diagnosis: atypia Check Margins: yes  Right Upper Back  Epidermal / dermal shaving  Lesion diameter (cm):  1 Informed consent: discussed and consent obtained   Timeout: patient name, date of birth, surgical site, and procedure verified   Procedure prep:  Patient was prepped and draped in usual sterile fashion Prep type:  Chlorhexidine Anesthesia: the lesion was anesthetized in a standard fashion   Anesthetic:  1% lidocaine w/ epinephrine 1-100,000 local infiltration Instrument used: DermaBlade   Hemostasis achieved with: aluminum chloride   Outcome: patient tolerated procedure well   Post-procedure details: sterile dressing applied and wound care instructions given   Dressing type: petrolatum gauze, petrolatum and bandage    Specimen 2 - Surgical pathology Differential Diagnosis: atypia Check Margins: yes  Left mid back  Epidermal / dermal shaving  Lesion diameter (cm):  1.3 Informed consent: discussed and consent obtained   Timeout: patient name, date of birth, surgical site, and procedure verified   Procedure prep:  Patient was prepped and draped in usual sterile fashion Prep type:  Chlorhexidine Anesthesia: the lesion was anesthetized in a standard fashion   Anesthetic:  1% lidocaine w/ epinephrine 1-100,000 local infiltration Instrument used: DermaBlade   Hemostasis achieved with: aluminum chloride   Outcome: patient tolerated procedure well   Post-procedure details: sterile dressing applied and wound care instructions given   Dressing type: petrolatum gauze, petrolatum and bandage    Specimen 3 - Surgical pathology Differential Diagnosis: atypia  Check Margins: yes  Seborrheic keratosis, inflamed (6) Right Upper Back; Left Temple (2); Right Temple; Left Preauricular Area; Right Forehead  Destruction of lesion - Left  Preauricular Area, Left Temple (2), Right Forehead, Right Temple, Right Upper Back Complexity: simple   Destruction method: cryotherapy   Informed consent: discussed and consent obtained   Timeout:  patient name, date of birth, surgical site, and procedure verified Lesion destroyed using liquid nitrogen: Yes   Cryotherapy cycles:  1 Outcome: patient tolerated procedure well with no complications   Post-procedure details: wound care instructions given

## 2019-11-27 NOTE — Patient Instructions (Signed)

## 2019-12-01 ENCOUNTER — Telehealth: Payer: Self-pay

## 2019-12-01 NOTE — Telephone Encounter (Signed)
Phone call to patient with his Pathology results and [facility] recommendations.  Pathology results and Kelli's recommendations given to patient.  Appointment scheduled with Robyne Askew on 01/15/2020 @ 9:30am.

## 2019-12-01 NOTE — Telephone Encounter (Signed)
-----   Message from Warren Danes, [place] sent at 12/01/2019  8:24 AM EDT ----- WS x3

## 2019-12-03 ENCOUNTER — Other Ambulatory Visit: Payer: Self-pay | Admitting: *Deleted

## 2019-12-03 MED ORDER — FERROUS SULFATE 325 (65 FE) MG PO TABS: 325.0000 mg | ORAL_TABLET | Freq: Every day | ORAL | 1 refills | Status: DC

## 2019-12-06 ENCOUNTER — Other Ambulatory Visit: Payer: Self-pay | Admitting: Family Medicine

## 2019-12-11 ENCOUNTER — Other Ambulatory Visit: Payer: Self-pay | Admitting: Cardiology

## 2019-12-11 NOTE — Telephone Encounter (Signed)
*  STAT* If patient is at the pharmacy, call can be transferred to refill team.   1. Which medications need to be refilled? (please list name of each medication and dose if known) XARELTO 15 MG TABS tablet  2. Which pharmacy/location (including street and city if local pharmacy) is medication to be sent to? [id number] - [place], [place] - [address]  3. Do they need a 30 day or 90 day supply? 7 day    Patient is out of medication. He states [employer] was not filling it, but believes 7 days worth of medication is enough to hold him until they do.

## 2019-12-14 MED ORDER — RIVAROXABAN 15 MG PO TABS: 15.0000 mg | ORAL_TABLET | Freq: Every day | ORAL | 0 refills | Status: DC

## 2019-12-14 NOTE — Telephone Encounter (Signed)
Rx request sent to pharmacy.  

## 2019-12-16 ENCOUNTER — Other Ambulatory Visit: Payer: Self-pay

## 2019-12-16 ENCOUNTER — Encounter: Payer: Self-pay | Admitting: Family Medicine

## 2019-12-16 DIAGNOSIS — N184 Chronic kidney disease, stage 4 (severe): Secondary | ICD-10-CM | POA: Diagnosis not present

## 2019-12-16 MED ORDER — RIVAROXABAN 15 MG PO TABS: 15.0000 mg | ORAL_TABLET | Freq: Every day | ORAL | 3 refills | Status: DC

## 2019-12-16 MED ORDER — RIVAROXABAN 15 MG PO TABS: 15.0000 mg | ORAL_TABLET | Freq: Every day | ORAL | 1 refills | Status: DC

## 2019-12-16 NOTE — Telephone Encounter (Signed)
Patient walked in requesting refill for Xarelto 15mg . Prescription sent to [place] per patient request.

## 2019-12-21 ENCOUNTER — Other Ambulatory Visit: Payer: Self-pay

## 2019-12-21 MED ORDER — RIVAROXABAN 15 MG PO TABS: 15.0000 mg | ORAL_TABLET | Freq: Every day | ORAL | 1 refills | Status: DC

## 2019-12-23 ENCOUNTER — Other Ambulatory Visit: Payer: Self-pay | Admitting: Family Medicine

## 2019-12-23 DIAGNOSIS — D631 Anemia in chronic kidney disease: Secondary | ICD-10-CM | POA: Diagnosis not present

## 2019-12-23 DIAGNOSIS — E1122 Type 2 diabetes mellitus with diabetic chronic kidney disease: Secondary | ICD-10-CM | POA: Diagnosis not present

## 2019-12-23 DIAGNOSIS — I129 Hypertensive chronic kidney disease with stage 1 through stage 4 chronic kidney disease, or unspecified chronic kidney disease: Secondary | ICD-10-CM | POA: Diagnosis not present

## 2019-12-23 DIAGNOSIS — N184 Chronic kidney disease, stage 4 (severe): Secondary | ICD-10-CM | POA: Diagnosis not present

## 2020-01-07 NOTE — Addendum Note (Signed)
Addended by: Ashok Cordia B on: 01/07/2020 04:06 PM   Modules accepted: Orders

## 2020-01-08 ENCOUNTER — Other Ambulatory Visit: Payer: Self-pay | Admitting: Family Medicine

## 2020-01-15 ENCOUNTER — Encounter: Payer: Self-pay | Admitting: Physician Assistant

## 2020-01-15 ENCOUNTER — Other Ambulatory Visit: Payer: Self-pay

## 2020-01-15 ENCOUNTER — Ambulatory Visit (INDEPENDENT_AMBULATORY_CARE_PROVIDER_SITE_OTHER): Payer: Medicare HMO | Admitting: Physician Assistant

## 2020-01-15 DIAGNOSIS — S80862A Insect bite (nonvenomous), left lower leg, initial encounter: Secondary | ICD-10-CM | POA: Diagnosis not present

## 2020-01-15 DIAGNOSIS — L988 Other specified disorders of the skin and subcutaneous tissue: Secondary | ICD-10-CM | POA: Diagnosis not present

## 2020-01-15 DIAGNOSIS — W57XXXA Bitten or stung by nonvenomous insect and other nonvenomous arthropods, initial encounter: Secondary | ICD-10-CM

## 2020-01-15 DIAGNOSIS — D225 Melanocytic nevi of trunk: Secondary | ICD-10-CM | POA: Diagnosis not present

## 2020-01-15 DIAGNOSIS — D239 Other benign neoplasm of skin, unspecified: Secondary | ICD-10-CM

## 2020-01-15 NOTE — Patient Instructions (Signed)

## 2020-01-15 NOTE — Progress Notes (Signed)
Follow-Up Visit   Subjective  Douglas Ballard is a 69 y.o. male who presents for the following: Procedure (wider shave x3 right sholder moderate, right upper back, moderate to severe, left mid back severe. ) and Skin Problem (check tick bite on left shin).    The following portions of the chart were reviewed this encounter and updated as appropriate: Tobacco  Allergies  Meds  Problems  Med Hx  Surg Hx  Fam Hx      Objective  Well appearing patient in no apparent distress; mood and affect are within normal limits.  All skin waist up examined.  Objective  Left mid back, Right Shoulder - Anterior, Right Upper Back: White scars  Objective  Left Lower Leg - Anterior: Pink papule. No targetoid lesion or signs of infection  Assessment & Plan  Dysplastic nevi (3) Right Shoulder - Anterior; Right Upper Back; Left mid back  Epidermal / dermal shaving - Right Shoulder - Anterior  Lesion length (cm):  1.2 Lesion width (cm):  1.2 Margin per side (cm):  0.2 Total excision diameter (cm):  1.6 Informed consent: discussed and consent obtained   Timeout: patient name, date of birth, surgical site, and procedure verified   Procedure prep:  Patient was prepped and draped in usual sterile fashion Prep type:  Chlorhexidine Anesthesia: the lesion was anesthetized in a standard fashion   Anesthetic:  1% lidocaine w/ epinephrine 1-100,000 local infiltration Instrument used: DermaBlade and flexible razor blade   Hemostasis achieved with: aluminum chloride   Outcome: patient tolerated procedure well   Post-procedure details: sterile dressing applied and wound care instructions given   Dressing type: petrolatum gauze, petrolatum and bandage    Epidermal / dermal shaving - Right Upper back  Lesion length (cm):  1 Lesion width (cm):  1 Margin per side (cm):  0.1 Total excision diameter (cm):  1.2 Informed consent: discussed and consent obtained   Timeout: patient name, date of birth,  surgical site, and procedure verified   Procedure prep:  Patient was prepped and draped in usual sterile fashion Prep type:  Chlorhexidine Anesthesia: the lesion was anesthetized in a standard fashion   Anesthetic:  1% lidocaine w/ epinephrine 1-100,000 local infiltration Instrument used: DermaBlade and flexible razor blade   Hemostasis achieved with: aluminum chloride   Outcome: patient tolerated procedure well   Post-procedure details: sterile dressing applied and wound care instructions given   Dressing type: petrolatum gauze, petrolatum and bandage    Epidermal / dermal shaving - Left Mid Back Lesion length (cm):  1 Lesion width (cm):  1 Margin per side (cm):  0.1 Total excision diameter (cm):  1.2 Informed consent: discussed and consent obtained   Timeout: patient name, date of birth, surgical site, and procedure verified   Procedure prep:  Patient was prepped and draped in usual sterile fashion Prep type:  Chlorhexidine Anesthesia: the lesion was anesthetized in a standard fashion   Anesthetic:  1% lidocaine w/ epinephrine 1-100,000 local infiltration Instrument used: DermaBlade   Hemostasis achieved with: aluminum chloride   Outcome: patient tolerated procedure well   Post-procedure details: sterile dressing applied and wound care instructions given   Dressing type: petrolatum gauze, petrolatum and bandage    Specimen 1 - Surgical pathology Differential Diagnosis: Wider shave [id number] Check Margins: yes White scars  Specimen 2 - Surgical pathology Differential Diagnosis: DN [id number] Check Margins: yes White scars  Specimen 3 - Surgical pathology Differential Diagnosis:DN [id number] Check Margins:yes White scars  Left  lower shin- observe for changes

## 2020-01-19 DIAGNOSIS — D631 Anemia in chronic kidney disease: Secondary | ICD-10-CM | POA: Diagnosis not present

## 2020-01-19 DIAGNOSIS — N184 Chronic kidney disease, stage 4 (severe): Secondary | ICD-10-CM | POA: Diagnosis not present

## 2020-01-20 DIAGNOSIS — D631 Anemia in chronic kidney disease: Secondary | ICD-10-CM | POA: Diagnosis not present

## 2020-01-20 DIAGNOSIS — N184 Chronic kidney disease, stage 4 (severe): Secondary | ICD-10-CM | POA: Diagnosis not present

## 2020-02-05 NOTE — Addendum Note (Signed)
Addended by: Robyne Askew R on: 02/05/2020 05:46 PM   Modules accepted: Level of Service

## 2020-02-15 ENCOUNTER — Encounter: Payer: Self-pay | Admitting: Family Medicine

## 2020-02-15 ENCOUNTER — Telehealth (INDEPENDENT_AMBULATORY_CARE_PROVIDER_SITE_OTHER): Payer: Medicare HMO | Admitting: Family Medicine

## 2020-02-15 VITALS — BP 121/54 | HR 44 | Temp 98.0°F | Ht 69.0 in | Wt 161.0 lb

## 2020-02-15 DIAGNOSIS — D631 Anemia in chronic kidney disease: Secondary | ICD-10-CM

## 2020-02-15 DIAGNOSIS — N189 Chronic kidney disease, unspecified: Secondary | ICD-10-CM | POA: Diagnosis not present

## 2020-02-15 NOTE — Progress Notes (Signed)
   Douglas Ballard is a 69 y.o. male who presents today for a telephone visit.  Assessment/Plan:  New/Acute Problems: Fatigue Likely multifactorial though patient's low hemoglobin is likely main contributor. He will be getting blood draw tomorrow at nephrologist office to recheck hemoglobin after receiving EPO injection. If hemoglobin still in the 8 range will likely need to evaluate for GI losses with FOBT. Additionally will likely need to update echocardiogram at that time 2. If hemoglobin has responded back to normal range we will need to look for potential other causes including B12 deficiency and hyperthyroidism. He will send a MyChart message after his blood draw tomorrow.    Subjective:  HPI:  Patient with more fatigue over the last several weeks. Becomes more winded easily. No chest pain. Symptoms seem to be aggressively worsening. No changes in medication. No recent illnesses. He has been getting EPO injections through his nephrologist. Hemoglobin was 8.4 earlier this month down from 9 in April. He has had some tingling on the right side of his body over the past week. No weakness.       Objective/Observations   NAD  Telephone Visit   I connected with Douglas Ballard on 02/15/20 at 11:40 AM EDT via telephone and verified that I am speaking with the correct person using two identifiers. I discussed the limitations of evaluation and management by telemedicine and the availability of in person appointments. The patient expressed understanding and agreed to proceed.   Patient location: Home Provider location: [place] participating in the virtual visit: Myself and Patient   A total of 12 minutes were spent on medical discussion.      [name]. [name], MD 02/15/2020 11:57 AM

## 2020-02-16 DIAGNOSIS — D631 Anemia in chronic kidney disease: Secondary | ICD-10-CM | POA: Diagnosis not present

## 2020-02-16 DIAGNOSIS — N184 Chronic kidney disease, stage 4 (severe): Secondary | ICD-10-CM | POA: Diagnosis not present

## 2020-03-02 ENCOUNTER — Other Ambulatory Visit: Payer: Self-pay

## 2020-03-02 ENCOUNTER — Other Ambulatory Visit: Payer: Self-pay | Admitting: Family Medicine

## 2020-03-02 ENCOUNTER — Encounter: Payer: Self-pay | Admitting: Family Medicine

## 2020-03-02 MED ORDER — FUROSEMIDE 40 MG PO TABS: 80.0000 mg | ORAL_TABLET | Freq: Every day | ORAL | 1 refills | Status: DC

## 2020-03-14 ENCOUNTER — Emergency Department (HOSPITAL_COMMUNITY): Payer: Medicare HMO

## 2020-03-14 ENCOUNTER — Other Ambulatory Visit: Payer: Self-pay

## 2020-03-14 ENCOUNTER — Encounter (HOSPITAL_COMMUNITY): Payer: Self-pay

## 2020-03-14 ENCOUNTER — Inpatient Hospital Stay (HOSPITAL_COMMUNITY)
Admission: EM | Admit: 2020-03-14 | Discharge: 2020-03-17 | DRG: 291 | Disposition: A | Discharge status: Home / Self Care | Payer: Medicare HMO | Attending: Internal Medicine | Admitting: Internal Medicine

## 2020-03-14 ENCOUNTER — Inpatient Hospital Stay (HOSPITAL_COMMUNITY): Payer: Medicare HMO

## 2020-03-14 DIAGNOSIS — Z8546 Personal history of malignant neoplasm of prostate: Secondary | ICD-10-CM | POA: Diagnosis not present

## 2020-03-14 DIAGNOSIS — I152 Hypertension secondary to endocrine disorders: Secondary | ICD-10-CM | POA: Diagnosis present

## 2020-03-14 DIAGNOSIS — I482 Chronic atrial fibrillation, unspecified: Secondary | ICD-10-CM

## 2020-03-14 DIAGNOSIS — I160 Hypertensive urgency: Secondary | ICD-10-CM | POA: Diagnosis not present

## 2020-03-14 DIAGNOSIS — K2950 Unspecified chronic gastritis without bleeding: Secondary | ICD-10-CM | POA: Diagnosis not present

## 2020-03-14 DIAGNOSIS — I10 Essential (primary) hypertension: Secondary | ICD-10-CM | POA: Diagnosis not present

## 2020-03-14 DIAGNOSIS — T501X6A Underdosing of loop [high-ceiling] diuretics, initial encounter: Secondary | ICD-10-CM | POA: Diagnosis present

## 2020-03-14 DIAGNOSIS — Z7984 Long term (current) use of oral hypoglycemic drugs: Secondary | ICD-10-CM | POA: Diagnosis not present

## 2020-03-14 DIAGNOSIS — Z79899 Other long term (current) drug therapy: Secondary | ICD-10-CM

## 2020-03-14 DIAGNOSIS — D62 Acute posthemorrhagic anemia: Secondary | ICD-10-CM | POA: Diagnosis present

## 2020-03-14 DIAGNOSIS — D539 Nutritional anemia, unspecified: Secondary | ICD-10-CM | POA: Diagnosis present

## 2020-03-14 DIAGNOSIS — K5731 Diverticulosis of large intestine without perforation or abscess with bleeding: Secondary | ICD-10-CM | POA: Diagnosis present

## 2020-03-14 DIAGNOSIS — K922 Gastrointestinal hemorrhage, unspecified: Secondary | ICD-10-CM | POA: Diagnosis not present

## 2020-03-14 DIAGNOSIS — E1159 Type 2 diabetes mellitus with other circulatory complications: Secondary | ICD-10-CM | POA: Diagnosis not present

## 2020-03-14 DIAGNOSIS — K3189 Other diseases of stomach and duodenum: Secondary | ICD-10-CM | POA: Diagnosis not present

## 2020-03-14 DIAGNOSIS — Z9049 Acquired absence of other specified parts of digestive tract: Secondary | ICD-10-CM

## 2020-03-14 DIAGNOSIS — D631 Anemia in chronic kidney disease: Secondary | ICD-10-CM | POA: Diagnosis present

## 2020-03-14 DIAGNOSIS — Z9114 Patient's other noncompliance with medication regimen: Secondary | ICD-10-CM | POA: Diagnosis not present

## 2020-03-14 DIAGNOSIS — D122 Benign neoplasm of ascending colon: Secondary | ICD-10-CM | POA: Diagnosis not present

## 2020-03-14 DIAGNOSIS — Z7901 Long term (current) use of anticoagulants: Secondary | ICD-10-CM | POA: Diagnosis not present

## 2020-03-14 DIAGNOSIS — I480 Paroxysmal atrial fibrillation: Secondary | ICD-10-CM | POA: Diagnosis present

## 2020-03-14 DIAGNOSIS — Z87891 Personal history of nicotine dependence: Secondary | ICD-10-CM | POA: Diagnosis not present

## 2020-03-14 DIAGNOSIS — J9601 Acute respiratory failure with hypoxia: Secondary | ICD-10-CM | POA: Diagnosis not present

## 2020-03-14 DIAGNOSIS — F1020 Alcohol dependence, uncomplicated: Secondary | ICD-10-CM | POA: Diagnosis not present

## 2020-03-14 DIAGNOSIS — K635 Polyp of colon: Secondary | ICD-10-CM

## 2020-03-14 DIAGNOSIS — I130 Hypertensive heart and chronic kidney disease with heart failure and stage 1 through stage 4 chronic kidney disease, or unspecified chronic kidney disease: Secondary | ICD-10-CM | POA: Diagnosis not present

## 2020-03-14 DIAGNOSIS — I5033 Acute on chronic diastolic (congestive) heart failure: Secondary | ICD-10-CM | POA: Diagnosis present

## 2020-03-14 DIAGNOSIS — K921 Melena: Secondary | ICD-10-CM | POA: Diagnosis present

## 2020-03-14 DIAGNOSIS — Z20822 Contact with and (suspected) exposure to covid-19: Secondary | ICD-10-CM | POA: Diagnosis not present

## 2020-03-14 DIAGNOSIS — Z8673 Personal history of transient ischemic attack (TIA), and cerebral infarction without residual deficits: Secondary | ICD-10-CM

## 2020-03-14 DIAGNOSIS — K2970 Gastritis, unspecified, without bleeding: Secondary | ICD-10-CM | POA: Diagnosis not present

## 2020-03-14 DIAGNOSIS — I5031 Acute diastolic (congestive) heart failure: Secondary | ICD-10-CM | POA: Diagnosis not present

## 2020-03-14 DIAGNOSIS — K2971 Gastritis, unspecified, with bleeding: Secondary | ICD-10-CM | POA: Diagnosis not present

## 2020-03-14 DIAGNOSIS — J90 Pleural effusion, not elsewhere classified: Secondary | ICD-10-CM | POA: Diagnosis not present

## 2020-03-14 DIAGNOSIS — R0602 Shortness of breath: Secondary | ICD-10-CM | POA: Diagnosis not present

## 2020-03-14 DIAGNOSIS — F1010 Alcohol abuse, uncomplicated: Secondary | ICD-10-CM | POA: Diagnosis present

## 2020-03-14 DIAGNOSIS — E1122 Type 2 diabetes mellitus with diabetic chronic kidney disease: Secondary | ICD-10-CM | POA: Diagnosis not present

## 2020-03-14 DIAGNOSIS — I509 Heart failure, unspecified: Secondary | ICD-10-CM | POA: Diagnosis not present

## 2020-03-14 DIAGNOSIS — R195 Other fecal abnormalities: Secondary | ICD-10-CM | POA: Diagnosis not present

## 2020-03-14 DIAGNOSIS — D123 Benign neoplasm of transverse colon: Secondary | ICD-10-CM | POA: Diagnosis not present

## 2020-03-14 DIAGNOSIS — K5750 Diverticulosis of both small and large intestine without perforation or abscess without bleeding: Secondary | ICD-10-CM | POA: Diagnosis not present

## 2020-03-14 DIAGNOSIS — K641 Second degree hemorrhoids: Secondary | ICD-10-CM

## 2020-03-14 DIAGNOSIS — D649 Anemia, unspecified: Secondary | ICD-10-CM | POA: Diagnosis not present

## 2020-03-14 DIAGNOSIS — N184 Chronic kidney disease, stage 4 (severe): Secondary | ICD-10-CM

## 2020-03-14 DIAGNOSIS — K5730 Diverticulosis of large intestine without perforation or abscess without bleeding: Secondary | ICD-10-CM

## 2020-03-14 DIAGNOSIS — E1169 Type 2 diabetes mellitus with other specified complication: Secondary | ICD-10-CM | POA: Diagnosis present

## 2020-03-14 DIAGNOSIS — I110 Hypertensive heart disease with heart failure: Secondary | ICD-10-CM | POA: Diagnosis not present

## 2020-03-14 HISTORY — DX: Chronic kidney disease, unspecified: N18.9

## 2020-03-14 LAB — BRAIN NATRIURETIC PEPTIDE: B Natriuretic Peptide: 895.2 pg/mL — ABNORMAL HIGH (ref 0.0–100.0)

## 2020-03-14 LAB — CBC
HCT: 24.1 % — ABNORMAL LOW (ref 39.0–52.0)
Hemoglobin: 7.8 g/dL — ABNORMAL LOW (ref 13.0–17.0)
MCH: 32.5 pg (ref 26.0–34.0)
MCHC: 32.4 g/dL (ref 30.0–36.0)
MCV: 100.4 fL — ABNORMAL HIGH (ref 80.0–100.0)
Platelets: 240 10*3/uL (ref 150–400)
RBC: 2.4 MIL/uL — ABNORMAL LOW (ref 4.22–5.81)
RDW: 12.9 % (ref 11.5–15.5)
WBC: 7 10*3/uL (ref 4.0–10.5)
nRBC: 0 % (ref 0.0–0.2)

## 2020-03-14 LAB — BASIC METABOLIC PANEL
Anion gap: 9 (ref 5–15)
BUN: 42 mg/dL — ABNORMAL HIGH (ref 8–23)
CO2: 21 mmol/L — ABNORMAL LOW (ref 22–32)
Calcium: 8.5 mg/dL — ABNORMAL LOW (ref 8.9–10.3)
Chloride: 106 mmol/L (ref 98–111)
Creatinine, Ser: 2.82 mg/dL — ABNORMAL HIGH (ref 0.61–1.24)
GFR calc Af Amer: 25 mL/min — ABNORMAL LOW (ref >60)
GFR calc non Af Amer: 22 mL/min — ABNORMAL LOW (ref >60)
Glucose, Bld: 172 mg/dL — ABNORMAL HIGH (ref 70–99)
Potassium: 4.7 mmol/L (ref 3.5–5.1)
Sodium: 136 mmol/L (ref 135–145)

## 2020-03-14 LAB — BLOOD GAS, VENOUS
Acid-base deficit: 6 mmol/L — ABNORMAL HIGH (ref 0.0–2.0)
Bicarbonate: 18.9 mmol/L — ABNORMAL LOW (ref 20.0–28.0)
FIO2: 21
O2 Saturation: 33.7 %
Patient temperature: 98.6
pCO2, Ven: 37.2 mmHg — ABNORMAL LOW (ref 44.0–60.0)
pH, Ven: 7.326 (ref 7.250–7.430)
pO2, Ven: <31 mmHg — CL (ref 32.0–45.0)

## 2020-03-14 LAB — CBG MONITORING, ED
Glucose-Capillary: 183 mg/dL — ABNORMAL HIGH (ref 70–99)
Glucose-Capillary: 191 mg/dL — ABNORMAL HIGH (ref 70–99)
Glucose-Capillary: 230 mg/dL — ABNORMAL HIGH (ref 70–99)

## 2020-03-14 LAB — ECHOCARDIOGRAM COMPLETE
Area-P 1/2: 5.2 cm2
Calc EF: 56.1 %
Height: 69 in
S' Lateral: 3.79 cm
Single Plane A2C EF: 54.8 %
Single Plane A4C EF: 60.9 %
Weight: 2560 oz

## 2020-03-14 LAB — IRON AND TIBC
Iron: 65 ug/dL (ref 45–182)
Saturation Ratios: 26 % (ref 17.9–39.5)
TIBC: 249 ug/dL — ABNORMAL LOW (ref 250–450)
UIBC: 184 ug/dL

## 2020-03-14 LAB — SARS CORONAVIRUS 2 BY RT PCR (HOSPITAL ORDER, PERFORMED IN ~~LOC~~ HOSPITAL LAB): SARS Coronavirus 2: NEGATIVE

## 2020-03-14 LAB — PREPARE RBC (CROSSMATCH)

## 2020-03-14 LAB — HEMOGLOBIN A1C
Hgb A1c MFr Bld: 5.9 % — ABNORMAL HIGH (ref 4.8–5.6)
Mean Plasma Glucose: 122.63 mg/dL

## 2020-03-14 LAB — HIV ANTIBODY (ROUTINE TESTING W REFLEX): HIV Screen 4th Generation wRfx: NONREACTIVE

## 2020-03-14 LAB — FERRITIN: Ferritin: 46 ng/mL (ref 24–336)

## 2020-03-14 LAB — POC OCCULT BLOOD, ED: Fecal Occult Bld: POSITIVE — AB

## 2020-03-14 LAB — GLUCOSE, CAPILLARY: Glucose-Capillary: 330 mg/dL — ABNORMAL HIGH (ref 70–99)

## 2020-03-14 MED ORDER — ONDANSETRON HCL 4 MG/2ML IJ SOLN: 4.0000 mg | Freq: Four times a day (QID) | INTRAMUSCULAR | Status: DC | PRN

## 2020-03-14 MED ORDER — ALBUTEROL SULFATE HFA 108 (90 BASE) MCG/ACT IN AERS
8.0000 | INHALATION_SPRAY | Freq: Once | RESPIRATORY_TRACT | Status: AC
  Administered 2020-03-14: 8 via RESPIRATORY_TRACT
  Filled 2020-03-14: qty 6.7

## 2020-03-14 MED ORDER — SODIUM CHLORIDE 0.9 % IV SOLN
10.0000 mL/h | Freq: Once | INTRAVENOUS | Status: AC
  Administered 2020-03-14: 10 mL/h via INTRAVENOUS

## 2020-03-14 MED ORDER — SODIUM CHLORIDE 0.9% FLUSH
3.0000 mL | Freq: Two times a day (BID) | INTRAVENOUS | Status: DC
  Administered 2020-03-14 – 2020-03-17 (×6): 3 mL via INTRAVENOUS

## 2020-03-14 MED ORDER — NITROGLYCERIN IN D5W 200-5 MCG/ML-% IV SOLN
0.0000 ug/min | INTRAVENOUS | Status: DC
  Administered 2020-03-14: 5 ug/min via INTRAVENOUS
  Filled 2020-03-14: qty 250

## 2020-03-14 MED ORDER — INSULIN GLARGINE 100 UNIT/ML ~~LOC~~ SOLN
5.0000 [IU] | Freq: Every day | SUBCUTANEOUS | Status: DC
  Administered 2020-03-15 – 2020-03-17 (×3): 5 [IU] via SUBCUTANEOUS
  Filled 2020-03-14 (×3): qty 0.05

## 2020-03-14 MED ORDER — ISOSORB DINITRATE-HYDRALAZINE 20-37.5 MG PO TABS
2.0000 | ORAL_TABLET | Freq: Two times a day (BID) | ORAL | Status: DC
  Administered 2020-03-14 – 2020-03-17 (×7): 2 via ORAL
  Filled 2020-03-14 (×7): qty 2

## 2020-03-14 MED ORDER — ALPRAZOLAM 0.25 MG PO TABS
0.2500 mg | ORAL_TABLET | Freq: Two times a day (BID) | ORAL | Status: DC | PRN
  Administered 2020-03-14 – 2020-03-17 (×2): 0.25 mg via ORAL
  Filled 2020-03-14 (×2): qty 1

## 2020-03-14 MED ORDER — AEROCHAMBER Z-STAT PLUS/MEDIUM MISC
1.0000 | Freq: Once | Status: AC
  Administered 2020-03-14: 1
  Filled 2020-03-14: qty 1

## 2020-03-14 MED ORDER — PANTOPRAZOLE SODIUM 40 MG IV SOLR
40.0000 mg | Freq: Once | INTRAVENOUS | Status: AC
  Administered 2020-03-14: 40 mg via INTRAVENOUS
  Filled 2020-03-14: qty 40

## 2020-03-14 MED ORDER — FUROSEMIDE 10 MG/ML IJ SOLN
40.0000 mg | Freq: Once | INTRAMUSCULAR | Status: AC
  Administered 2020-03-14: 40 mg via INTRAVENOUS
  Filled 2020-03-14: qty 4

## 2020-03-14 MED ORDER — LABETALOL HCL 200 MG PO TABS
200.0000 mg | ORAL_TABLET | Freq: Every day | ORAL | Status: DC
  Administered 2020-03-14 – 2020-03-17 (×4): 200 mg via ORAL
  Filled 2020-03-14 (×4): qty 1

## 2020-03-14 MED ORDER — SODIUM CHLORIDE 0.9% FLUSH: 3.0000 mL | INTRAVENOUS | Status: DC | PRN

## 2020-03-14 MED ORDER — PREDNISONE 20 MG PO TABS
60.0000 mg | ORAL_TABLET | Freq: Once | ORAL | Status: AC
  Administered 2020-03-14: 60 mg via ORAL
  Filled 2020-03-14: qty 3

## 2020-03-14 MED ORDER — DILTIAZEM HCL ER COATED BEADS 240 MG PO CP24
240.0000 mg | ORAL_CAPSULE | Freq: Every day | ORAL | Status: DC
  Administered 2020-03-14 – 2020-03-17 (×4): 240 mg via ORAL
  Filled 2020-03-14: qty 1
  Filled 2020-03-14: qty 2
  Filled 2020-03-14 (×2): qty 1

## 2020-03-14 MED ORDER — HYDRALAZINE HCL 20 MG/ML IJ SOLN
10.0000 mg | Freq: Once | INTRAMUSCULAR | Status: AC
  Administered 2020-03-14: 10 mg via INTRAVENOUS
  Filled 2020-03-14: qty 1

## 2020-03-14 MED ORDER — ACETAMINOPHEN 325 MG PO TABS: 650.0000 mg | ORAL_TABLET | ORAL | Status: DC | PRN

## 2020-03-14 MED ORDER — CLONIDINE HCL 0.1 MG PO TABS
0.1000 mg | ORAL_TABLET | Freq: Every day | ORAL | Status: DC
  Administered 2020-03-14 – 2020-03-17 (×4): 0.1 mg via ORAL
  Filled 2020-03-14 (×4): qty 1

## 2020-03-14 MED ORDER — INSULIN ASPART 100 UNIT/ML ~~LOC~~ SOLN
0.0000 [IU] | Freq: Three times a day (TID) | SUBCUTANEOUS | Status: DC
  Administered 2020-03-14: 2 [IU] via SUBCUTANEOUS
  Administered 2020-03-14: 3 [IU] via SUBCUTANEOUS
  Administered 2020-03-15 (×2): 2 [IU] via SUBCUTANEOUS
  Administered 2020-03-15: 4 [IU] via SUBCUTANEOUS
  Administered 2020-03-16: 7 [IU] via SUBCUTANEOUS
  Administered 2020-03-17: 3 [IU] via SUBCUTANEOUS
  Filled 2020-03-14: qty 0.09

## 2020-03-14 MED ORDER — SODIUM CHLORIDE 0.9 % IV SOLN: 250.0000 mL | INTRAVENOUS | Status: DC | PRN

## 2020-03-14 MED ORDER — FUROSEMIDE 10 MG/ML IJ SOLN
40.0000 mg | Freq: Two times a day (BID) | INTRAMUSCULAR | Status: DC
  Administered 2020-03-14 – 2020-03-16 (×5): 40 mg via INTRAVENOUS
  Filled 2020-03-14 (×5): qty 4

## 2020-03-14 NOTE — ED Provider Notes (Signed)
Patient states he has been getting short of breath for the past week.  He denies cough but states he has been wheezing.  He states he [place] got [place] from the beach where he [place] with 4 other people who were not sick.  He states he used to smoke 2 packs a day for 25 years and had quit smoking for 6 years but he is now smoking the small cigars, about 2 a day.  When I entered the room patient had just been walked by nursing staff.  They report he did well while walking however he did get short of breath.  When he sat back down in his chair his pulse ox did drop briefly to 89%.  Patient looks like he is having some shortness of breath.  When I listen to him he has very diminished breath sounds diffusely.  Nursing staff states he was wheezing when he first arrived to the ED.  Patient states he has never used an inhaler or nebulizer before.  Medical screening examination/treatment/procedure(s) were conducted as a shared visit with non-physician practitioner(s) and myself.  I personally evaluated the patient during the encounter.  EKG Interpretation  Date/Time:  Monday March 14 2020 03:46:18 EDT Ventricular Rate:  63 PR Interval:    QRS Duration: 97 QT Interval:  487 QTC Calculation: 499 R Axis:   17 Text Interpretation: Sinus rhythm Anterior infarct, old No significant change since last tracing 24 Feb 2020 Confirmed by Rolland Porter [phone]) on 03/14/2020 4:26:13 AM     Rolland Porter, MD 03/14/20 [phone]

## 2020-03-14 NOTE — Progress Notes (Signed)
  Echocardiogram 2D Echocardiogram has been performed.  Douglas Ballard 03/14/2020, 1:52 PM

## 2020-03-14 NOTE — ED Provider Notes (Addendum)
[facility] DEPT Provider Note   CSN: [id number] Arrival date & time: 03/14/20  0325     History Chief Complaint  Patient presents with  . Shortness of Breath    Douglas Ballard is a 69 y.o. male with history of CKD, anemia on EPO injections, diabetes, remote tobacco use, cigar use, atrial fibrillation on Xarelto, grade 2 diastolic dysfunction, LVEF 55-60%, colo-vesical fistula s/p colectomy presents to the ED for evaluation of shortness of breath for the last week.  Shortness of breath is exertional but also when laying down.  Feels like if he leans forward and sits up he is able to breathe a little bit better.  Patient was at the beach recently and states walking on the beach made his breathing harder.  States tonight he tried to lay down 5 different times but was really uncomfortable and had to sit up to be able to breathe.  Reports ankle swelling.  States he has been out of his furosemide for at least 3 weeks.  Has been compliant with Xarelto but just ran out tonight.  Denies any fever.  No nasal congestion, sore throat, cough, chest pain.  Fully vaccinated for COVID-19.  Denies calf pain.  No hemoptysis.  No history of blood clots.  No recent surgery, hormone therapy.  No history of active cancer.  Drove to the beach a week ago, 4 hours.  Drove back yesterday.  States he quit smoking cigarettes for 6 years but recently started smoking cigars, 2 a day.  Has never been told he has COPD.  Has never used inhalers or nebulizer treatments.  Known history of low hemoglobin.  Denies history of GI bleed.  Denies melena, blood in the stool.  HPI     Past Medical History:  Diagnosis Date  . 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   Marland Kitchen Anxiety   . Atrial fibrillation ([place])   . Atypical mole 05/13/2008   LEFT MEDIAL CHEST SLIGHT/MOD.  Marland Kitchen Atypical  nevi 05/13/2008   LEFT LATERAL CHEST SLIGHT  . Atypical nevi 04/29/2015   RIGHT POST SHOULDER MOD/SEV. TX W/S  . Atypical nevi 04/29/2015   RIGHT MID ABDOMEN MODERATE TX W/S  . Atypical nevi 09/27/2015   LEFT UPPER ARM SEVERE TX W/S  . Atypical nevi 09/27/2015   MID LOWER BACK MODERATE  . Atypical nevi 09/27/2015   MID UPPER BACK SEVERE TX EXC  . Atypical nevi 09/27/2015   LEFT UPPER BACK MOD.SEV TX EXC  . Atypical nevi 03/23/2016   RIGHT LOWER BACK MILD  . Atypical nevi 03/23/2016   LEFT SIDE TORSO MILD  . Atypical nevi 06/14/2016   LEFT LOWER BACK MOD/SEV   . Atypical nevi 06/14/2016   LEFT CHEST MODERATE  . Atypical nevi 08/30/2017   MID UPPER BACK SUP MODERATE  . Atypical nevi 08/30/2017   RIGHT CHEST SEVERE   . Atypical nevi 07/25/2018   LEFT SHOULDER ANTERIOR TX WIDERSHAVE  . Atypical nevi 07/25/2018   LEFT SHOULDER POST MODERATE  . Atypical nevi 08/25/2019   LEFT SIDE ABDOMEN MODERATE FREE  . Atypical nevi 11/27/2019   RIGHT SHOULDER MODERATE TX W/S  . Atypical nevi 11/27/2019   RIGHT UPPER BACK MOD/SEVERE TX W/S  . Atypical nevi 11/27/2019   LEFT MID BACK SEVERE TX W/S  . Cancer [facility]) 2012   Prostate surgery  . Chronic diastolic  CHF (congestive heart failure) ([place]) 11/01/2014   Echo 8/15: Mild LVH, EF 50-55%, moderate BAE  //  b. Echo 7/17: EF 55-60%, normal wall motion, grade 2 diastolic dysfunction, MAC, mild MR, moderate LAE, trivial PI  . Compression fracture of thoracic vertebra ([place]) 06/26/2011  . Diabetes mellitus type 2 in nonobese (HCC)   . Fistula, bladder   . Frequent urination at night   . History of adenomatous polyp of colon 06/14/2014  . History of nuclear stress test    a. Myoview 10/15: Overall Impression: Low risk stress nuclear study demonstrating mild baseline ST-T changes with normal myocardial perfusion and low normal EF of 50%. // b.Myoview 7/17: EF 50%, no ischemia or scar, low risk (EF normal by recent echo)  . Hypertension   . PNA  (pneumonia) 06/26/2011  . Sepsis due to urinary tract infection ([place]) 04/16/2014  . Stroke ([place]) sept 1, 2015   tia x 3    Patient Active Problem List   Diagnosis Date Noted  . Acute CHF (congestive heart failure) ([place]) 03/14/2020  . Anemia due to chronic kidney disease 08/31/2018  . Stress 08/01/2018  . Type 2 diabetes mellitus with stage 3 chronic kidney disease, without long-term current use of insulin ([place]) 09/06/2017  . CKD (chronic kidney disease) stage 3, GFR 30-59 ml/min (HCC) 02/07/2016  . Insomnia 09/19/2015  . Coarse tremors 09/19/2015  . Chronic diastolic CHF (congestive heart failure) ([place]) 11/01/2014  . History of adenomatous polyp of colon 06/14/2014  . Paroxysmal atrial fibrillation ([place]) 05/19/2014  . Elevated bilirubin 04/14/2014  . 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 Hypertension associated with diabetes ([place]) 06/26/2011  . Personal history of prostate cancer s/p prostatectomy 2012 06/26/2011    Past Surgical History:  Procedure Laterality Date  . COLON SURGERY    . COLONOSCOPY N/A 06/14/2014   Procedure: COLONOSCOPY;  Surgeon: Gatha Mayer, MD;  Location: [facility];  Service: Endoscopy;  Laterality: N/A;  . CYSTOSCOPY WITH STENT PLACEMENT Bilateral 06/15/2014   Procedure: CYSTOSCOPY WITH BILATERAL STENT PLACEMENT;  Surgeon: Bernestine Amass, MD;  Location: [facility]S;  Service: Urology;  Laterality: Bilateral;  . LIPOMA EXCISION  2012   Dr Nonah Mattes  . moles removed     Back  . PROCTOSCOPY N/A 06/15/2014   Procedure: RIGID PROCTOSCOPY;  Surgeon: Michael Boston, MD;  Location: [facility];  Service: General;  Laterality: N/A;  . RADIOLOGY WITH ANESTHESIA N/A 07/28/2014   Procedure: Embolization;  Surgeon: Luanne Bras, MD;  Location: [facility];  Service: Radiology;  Laterality: N/A;  . ROBOT ASSISTED LAPAROSCOPIC RADICAL PROSTATECTOMY  01/31/2011   Robotic-assisted laparoscopic radical retropubic       Family History  Problem Relation Age of  Onset  . Atrial fibrillation Father        onset 43s. Had pacemaker placed for sinus pause  . Prostate cancer Father   . Breast cancer Mother   . Lung cancer Mother   . Leukemia Brother   . Colon polyps Brother   . Colon cancer Neg Hx   . Diabetes Neg Hx     Social History   Tobacco Use  . Smoking status: Former Smoker    Packs/day: 2.00    Years: 25.00    Pack years: 50.00    Types: Cigarettes    Quit date: 04/14/2014    Years since quitting: 5.9  . Smokeless tobacco: Never Used  Vaping Use  . Vaping Use: Never used  Substance Use Topics  .  Alcohol use: Yes    Alcohol/week: 7.0 - 10.0 standard drinks    Types: 7 - 10 Standard drinks or equivalent per week    Comment: 2 bottles of wine with wife a week; cocktail every other night  . Drug use: No    Home Medications Prior to Admission medications   Medication Sig Start Date End Date Taking? Authorizing Provider  cloNIDine (CATAPRES) 0.1 MG tablet Take 0.1 mg by mouth daily. 06/26/17   [provider]  diltiazem (CARDIZEM CD) 180 MG 24 hr capsule Take 1 capsule (180 mg total) by mouth every morning. 12/25/18   Vivi Barrack, MD  DM-Doxylamine-Acetaminophen (NYQUIL HBP COLD & FLU) 15-6.25-325 MG/15ML LIQD Take 30 mLs by mouth at bedtime as needed (cough/sleep).    [provider]  FEROSUL 325 (65 Fe) MG tablet TAKE 1 TABLET EVERY DAY 03/02/20   Marin Olp, MD  ferrous sulfate 325 (65 FE) MG tablet Take by mouth. 09/02/18   [provider]  ferrous sulfate 325 (65 FE) MG tablet Take 1 tablet (325 mg total) by mouth daily. 12/03/19 12/02/20  Marin Olp, MD  furosemide (LASIX) 40 MG tablet Take 2 tablets (80 mg total) by mouth daily. 03/02/20   Vivi Barrack, MD  hydrOXYzine (ATARAX/VISTARIL) 50 MG tablet TAKE 1/2-2 TABLETS BY MOUTH AT BEDTIME AS NEEDED FOR ANXIETY (INSOMNIA). 12/07/19   Vivi Barrack, MD  isosorbide-hydrALAZINE (BIDIL) 20-37.5 MG tablet Take 2 tablets by mouth 2 (two) times  daily.     [provider]  JANUVIA 100 MG tablet TAKE 1 TABLET BY MOUTH EVERY DAY 01/08/20   Vivi Barrack, MD  labetalol (NORMODYNE) 200 MG tablet Take 2 tablets (400 mg total) by mouth 2 (two) times daily. 11/26/18   Nita Sells, MD  Rivaroxaban (XARELTO) 15 MG TABS tablet Take 1 tablet (15 mg total) by mouth daily with supper. 12/21/19   Almyra Deforest, PA  triamcinolone cream (KENALOG) 0.1 % Apply topically 2 (two) times daily. 11/12/19   Warren Danes, PA-C  Vitamin D, Ergocalciferol, (DRISDOL) 1.25 MG (50000 UT) CAPS capsule Take 1 capsule (50,000 Units total) by mouth every Wednesday. 02/18/19   Vivi Barrack, MD  zolpidem (AMBIEN) 10 MG tablet Take 1 tablet (10 mg total) by mouth at bedtime as needed. for sleep 11/13/19   Vivi Barrack, MD    Allergies    Hydrochlorothiazide, Lasix [furosemide], Nsaids, and Sulfa antibiotics  Review of Systems   Review of Systems  Respiratory: Positive for shortness of breath.   Cardiovascular: Positive for leg swelling.  Hematological: Bruises/bleeds easily.  All other systems reviewed and are negative.   Physical Exam Updated Vital Signs BP (!) 206/68 (BP Location: Left Arm)   Pulse 70   Temp 97.9 F (36.6 C) (Oral)   Resp 22   Ht _0  (1.753 m)   Wt 72.6 kg   SpO2 91%   BMI 23.63 kg/m   Physical Exam Vitals and nursing note reviewed.  Constitutional:      General: He is not in acute distress.    Appearance: He is well-developed.     Comments: NAD.  HENT:     Head: Normocephalic and atraumatic.     Right Ear: External ear normal.     Left Ear: External ear normal.     Nose: Nose normal.  Eyes:     General: No scleral icterus.    Conjunctiva/sclera: Conjunctivae normal.  Cardiovascular:  Rate and Rhythm: Normal rate and regular rhythm.     Heart sounds: Normal heart sounds. No murmur heard.      Comments: 1+ pitting edema ankle, pretibial, symmetric. No calf tenderness. No murmur.  Pulmonary:      Effort: Pulmonary effort is normal.     Breath sounds: Decreased breath sounds present. No wheezing.     Comments: Diminished air sounds diffusely, clear upper lobes bilaterally. No crackles. Normal work of breathing at rest.  Genitourinary:    Comments: Melanotic stool, pending hemoccult  Musculoskeletal:        General: No deformity. Normal range of motion.     Cervical back: Normal range of motion and neck supple.  Skin:    General: Skin is warm and dry.     Capillary Refill: Capillary refill takes less than 2 seconds.  Neurological:     Mental Status: He is alert and oriented to person, place, and time.  Psychiatric:        Behavior: Behavior normal.        Thought Content: Thought content normal.        Judgment: Judgment normal.     ED Results / Procedures / Treatments   Labs (all labs ordered are listed, but only abnormal results are displayed) Labs Reviewed  BASIC METABOLIC PANEL - Abnormal; Notable for the following components:      Result Value   CO2 21 (*)    Glucose, Bld 172 (*)    BUN 42 (*)    Creatinine, Ser 2.82 (*)    Calcium 8.5 (*)    GFR calc non Af Amer 22 (*)    GFR calc Af Amer 25 (*)    All other components within normal limits  CBC - Abnormal; Notable for the following components:   RBC 2.40 (*)    Hemoglobin 7.8 (*)    HCT 24.1 (*)    MCV 100.4 (*)    All other components within normal limits  BRAIN NATRIURETIC PEPTIDE - Abnormal; Notable for the following components:   B Natriuretic Peptide 895.2 (*)    All other components within normal limits  POC OCCULT BLOOD, ED - Abnormal; Notable for the following components:   Fecal Occult Bld POSITIVE (*)    All other components within normal limits  SARS CORONAVIRUS 2 BY RT PCR (HOSPITAL ORDER, [facility])  I-STAT VENOUS BLOOD GAS, ED  TYPE AND SCREEN    EKG EKG Interpretation  Date/Time:  Monday March 14 2020 03:46:18 EDT Ventricular Rate:  63 PR Interval:    QRS  Duration: 97 QT Interval:  487 QTC Calculation: 499 R Axis:   17 Text Interpretation: Sinus rhythm Anterior infarct, old No significant change since last tracing 24 Feb 2020 Confirmed by Rolland Porter [phone]) on 03/14/2020 4:26:13 AM   Radiology DG Chest 1 View  Result Date: 03/14/2020 CLINICAL DATA:  Shortness of breath for a few hours.  Out of Lasix. EXAM: CHEST  1 VIEW COMPARISON:  11/25/2019 FINDINGS: Normal heart size. Interstitial opacity with Kerley lines and small pleural effusions. No pneumothorax. IMPRESSION: CHF. Electronically Signed   By: Monte Fantasia M.D.   On: 03/14/2020 05:09    Procedures .Critical Care Performed by: Kinnie Feil, PA-C Authorized by: Kinnie Feil, PA-C   Critical care provider statement:    Critical care time (minutes):  45   Critical care was necessary to treat or prevent imminent or life-threatening deterioration of the following  conditions:  Cardiac failure (CHF)   Critical care was time spent personally by me on the following activities:  Discussions with consultants, evaluation of patient's response to treatment, examination of patient, ordering and performing treatments and interventions, ordering and review of laboratory studies, ordering and review of radiographic studies, pulse oximetry, re-evaluation of patient's condition, obtaining history from patient or surrogate, review of old charts and development of treatment plan with patient or surrogate   I assumed direction of critical care for this patient from another provider in my specialty: no     (including critical care time)  Medications Ordered in ED Medications  nitroGLYCERIN 50 mg in dextrose 5 % 250 mL (0.2 mg/mL) infusion (has no administration in time range)  albuterol (VENTOLIN HFA) 108 (90 Base) MCG/ACT inhaler 8 puff (8 puffs Inhalation Given 03/14/20 0500)  predniSONE (DELTASONE) tablet 60 mg (60 mg Oral Given 03/14/20 0455)  aerochamber Z-Stat Plus/medium 1 each (1 each  Other Given 03/14/20 0500)  furosemide (LASIX) injection 40 mg (40 mg Intravenous Given 03/14/20 0263)    ED Course  I have reviewed the triage vital signs and the nursing notes.  Pertinent labs & imaging results that were available during my care of the patient were reviewed by me and considered in my medical decision making (see chart for details).  Clinical Course as of Mar 14 613  Mon Mar 14, 2020  0404 Hemoglobin(!): 7.8 [CG]  0426 Creatinine(!): 2.82 [CG]  0426 GFR, Est African American(!): 25 [CG]  0427 Sinus rhythm Anterior infarct, old No significant change since last tracing 24 Feb 2020 Confirmed by Rolland Porter [phone]) on 03/14/2020 4:26:13 AM  ED EKG [CG]    Clinical Course User Index [CG] Arlean Hopping   MDM Rules/Calculators/A&P                          I obtained additional history from triage, nursing notes and review of medical chart.  Previous medical records available, nursing notes reviewed to obtain more history and assist with MDM  Last echo 08/2018 EF 55-60%. Myoview 02/2016 normal. Seems patient's Hgb was normal September 2020, sudden drop January 2021. No GI work up.  Baseline creatinine ~3.   This patient complains of SOB. Reports orthopnea, ankle edema.   Chief complain involves an extensive number of treatment options and is a complaint that carries with it a high risk of complications and morbidity and mortality.    The differential diagnosis includes CHF exacerbation vs symptomatic anemia vs COPD exacerbation, likely multifactorial.  No CP making ACS less likely. On xarelto and PE unlikely as well.  Denies melena. Missed furosemide for 3 weeks.   Laboratory studies and imaging ordered in triage by triage RN.  I have personally visualized and interpreted these.  Hemoglobin 7.8.  Creatinine improved from baseline.  BUN 42. Chest x-ray reveals CHF.  I have added BNP.   Cardiac monitoring, cont pulse ox.   Ordered lasix, nitro gtt. Shared with  EDP who ordered albuterol, prednisone.   0545: ER work up consistent with CHF exacerbation.  Patient hypertensive, will start nitro gtt.  Patient re-evaluated and no clinical decline. Ambulated here and SpO2 dropped to 89%.  Will admit. Pending hemoccult here. May need GI consult while admitted.   5027: Patient re-evaluated. Noted increased work of breathing now at rest, tachypnic high 30s speaking in 2-3 word sentences.  Will order bipap, vbg.  Melena with positive hemocult, type and  screen, protonix ordered. 1 unit PRBC ordered, has received lasix.  Will need careful transfusion, GI consult.  Messaged unassigned GI service.   0613: RT notified me patient reported improvement in SOB, will hold off on Bipap at this time. Re-evaluated with patient tachypnea now high 20s, looks better. Evaluated by EDP.  Final Clinical Impression(s) / ED Diagnoses Final diagnoses:  Congestive heart failure, unspecified HF chronicity, unspecified heart failure type [facility])    Rx / DC Orders ED Discharge Orders    None         Kinnie Feil, PA-C 03/14/20 0938    Rolland Porter, MD 03/14/20 [phone]

## 2020-03-14 NOTE — ED Notes (Signed)
Per Dr. Neysa Bonito, patient is to be NPO until seen by GI.  Patient updated.

## 2020-03-14 NOTE — H&P (Signed)
History and Physical        Hospital Admission Note Date: 03/14/2020  Patient name: Douglas Ballard Medical record number: [id number] Date of birth: 03-15-1951 Age: 69 y.o. Gender: male  PCP: Vivi Barrack, MD  Patient coming from: Home  At baseline, ambulates: Dependently  Chief Complaint    Chief Complaint  Patient presents with  . Shortness of Breath      HPI:   This is a 69 year old male with history of CKD, anemia on EPO injections with nephrology due for his next injection tomorrow, diabetes, tobacco use, atrial fibrillation on Xarelto, anemia, HFpEF last EF 55 to 60%, colovesicular fistula s/p colectomy, alcohol use who presented to the ED with shortness of breath for the last week.    Admits that several weeks ago he ran out of his Lasix but has been unable to get this refilled.  States that this past week he went away to the beach and drank about three quarters of a gallon of liquor with his last drink on Saturday.  Drinks several glasses of wine per week and denies any history of withdrawal symptoms.  States last night he tried to lay down 5 different times but was just uncomfortable and had to sit up to breathe and also with ankle swelling.  Fully vaccinated against COVID-19.  No complaints of melena, chest pain, palpitations or any other issues.  ED Course: Patient was hypertensive in the ED with SBP in 200s.  Notable labs: CO2 21, glucose 172, BUN 42, creatinine 2.82 (baseline 2.8-3), BNP 895, Hb 7.8 (9.61-year ago).  Given a dose of Lasix and started on a nitro drip for his hypertension.  Ambulated in the ED had hypoxia to 89%.  FOBT positive.  Later in the morning and increased work of breathing, tachypnea and BiPAP was ordered but not used with VBG ordered.  He was typed and screened, given Protonix and 1 unit PRBC was ordered.  [facility] was consulted.  Vitals:     03/14/20 1122 03/14/20 1130  BP: (!) 192/77 (!) 198/74  Pulse: 73 70  Resp: (!) 24 (!) 29  Temp:    SpO2: 96% 97%     Review of Systems:  Review of Systems  Constitutional: Positive for malaise/fatigue. Negative for chills and fever.  Eyes: Negative for blurred vision.  Respiratory: Positive for shortness of breath. Negative for wheezing.   Cardiovascular: Positive for leg swelling. Negative for chest pain and palpitations.  Gastrointestinal: Negative for nausea and vomiting.  Genitourinary: Negative.   Neurological: Negative.   All other systems reviewed and are negative.   Medical/Social/Family History   Past Medical History: Past Medical History:  Diagnosis Date  . 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   Marland Kitchen Anxiety   . Atrial fibrillation ([place])   . Atypical mole 05/13/2008   LEFT MEDIAL CHEST SLIGHT/MOD.  Marland Kitchen Atypical nevi 05/13/2008   LEFT LATERAL CHEST SLIGHT  . Atypical nevi 04/29/2015   RIGHT POST SHOULDER MOD/SEV. TX W/S  . Atypical nevi 04/29/2015   RIGHT MID ABDOMEN MODERATE TX W/S  . Atypical  nevi 09/27/2015   LEFT UPPER ARM SEVERE TX W/S  . Atypical nevi 09/27/2015   MID LOWER BACK MODERATE  . Atypical nevi 09/27/2015   MID UPPER BACK SEVERE TX EXC  . Atypical nevi 09/27/2015   LEFT UPPER BACK MOD.SEV TX EXC  . Atypical nevi 03/23/2016   RIGHT LOWER BACK MILD  . Atypical nevi 03/23/2016   LEFT SIDE TORSO MILD  . Atypical nevi 06/14/2016   LEFT LOWER BACK MOD/SEV   . Atypical nevi 06/14/2016   LEFT CHEST MODERATE  . Atypical nevi 08/30/2017   MID UPPER BACK SUP MODERATE  . Atypical nevi 08/30/2017   RIGHT CHEST SEVERE   . Atypical nevi 07/25/2018   LEFT SHOULDER ANTERIOR TX WIDERSHAVE  . Atypical nevi 07/25/2018   LEFT SHOULDER POST MODERATE  . Atypical nevi 08/25/2019   LEFT SIDE ABDOMEN MODERATE FREE  . Atypical  nevi 11/27/2019   RIGHT SHOULDER MODERATE TX W/S  . Atypical nevi 11/27/2019   RIGHT UPPER BACK MOD/SEVERE TX W/S  . Atypical nevi 11/27/2019   LEFT MID BACK SEVERE TX W/S  . Cancer [facility]) 2012   Prostate surgery  . Chronic diastolic CHF (congestive heart failure) ([place]) 11/01/2014   Echo 8/15: Mild LVH, EF 50-55%, moderate BAE  //  b. Echo 7/17: EF 55-60%, normal wall motion, grade 2 diastolic dysfunction, MAC, mild MR, moderate LAE, trivial PI  . Compression fracture of thoracic vertebra ([place]) 06/26/2011  . Diabetes mellitus type 2 in nonobese (HCC)   . Fistula, bladder   . Frequent urination at night   . History of adenomatous polyp of colon 06/14/2014  . History of nuclear stress test    a. Myoview 10/15: Overall Impression: Low risk stress nuclear study demonstrating mild baseline ST-T changes with normal myocardial perfusion and low normal EF of 50%. // b.Myoview 7/17: EF 50%, no ischemia or scar, low risk (EF normal by recent echo)  . Hypertension   . PNA (pneumonia) 06/26/2011  . Sepsis due to urinary tract infection ([place]) 04/16/2014  . Stroke ([place]) sept 1, 2015   tia x 3    Past Surgical History:  Procedure Laterality Date  . COLON SURGERY    . COLONOSCOPY N/A 06/14/2014   Procedure: COLONOSCOPY;  Surgeon: Gatha Mayer, MD;  Location: [facility];  Service: Endoscopy;  Laterality: N/A;  . CYSTOSCOPY WITH STENT PLACEMENT Bilateral 06/15/2014   Procedure: CYSTOSCOPY WITH BILATERAL STENT PLACEMENT;  Surgeon: Bernestine Amass, MD;  Location: [facility];  Service: Urology;  Laterality: Bilateral;  . LIPOMA EXCISION  2012   Dr Nonah Mattes  . moles removed     Back  . PROCTOSCOPY N/A 06/15/2014   Procedure: RIGID PROCTOSCOPY;  Surgeon: Michael Boston, MD;  Location: [facility];  Service: General;  Laterality: N/A;  . RADIOLOGY WITH ANESTHESIA N/A 07/28/2014   Procedure: Embolization;  Surgeon: Luanne Bras, MD;  Location: [facility];  Service: Radiology;  Laterality: N/A;  . ROBOT ASSISTED  LAPAROSCOPIC RADICAL PROSTATECTOMY  01/31/2011   Robotic-assisted laparoscopic radical retropubic    Medications: Prior to Admission medications   Medication Sig Start Date End Date Taking? Authorizing Provider  cloNIDine (CATAPRES) 0.1 MG tablet Take 0.1 mg by mouth daily. 06/26/17  Yes [provider]  diltiazem (CARDIZEM CD) 240 MG 24 hr capsule Take 240 mg by mouth daily. 01/27/20  Yes [provider]  DM-Doxylamine-Acetaminophen (NYQUIL HBP COLD & FLU) 15-6.25-325 MG/15ML LIQD Take 30 mLs by mouth at bedtime as needed (cough/sleep).  Yes [provider]  FEROSUL 325 (65 Fe) MG tablet TAKE 1 TABLET EVERY DAY Patient taking differently: Take 325 mg by mouth daily with breakfast.  03/02/20  Yes Marin Olp, MD  furosemide (LASIX) 40 MG tablet Take 2 tablets (80 mg total) by mouth daily. 03/02/20  Yes Vivi Barrack, MD  hydrOXYzine (ATARAX/VISTARIL) 50 MG tablet TAKE 1/2-2 TABLETS BY MOUTH AT BEDTIME AS NEEDED FOR ANXIETY (INSOMNIA). Patient taking differently: Take 25-100 mg by mouth at bedtime as needed for anxiety (insomnia).  12/07/19  Yes Vivi Barrack, MD  isosorbide-hydrALAZINE (BIDIL) 20-37.5 MG tablet Take 2 tablets by mouth 2 (two) times daily.    Yes [provider]  JANUVIA 100 MG tablet TAKE 1 TABLET BY MOUTH EVERY DAY Patient taking differently: Take 100 mg by mouth daily.  01/08/20  Yes Vivi Barrack, MD  labetalol (NORMODYNE) 200 MG tablet Take 2 tablets (400 mg total) by mouth 2 (two) times daily. Patient taking differently: Take 200 mg by mouth daily.  11/26/18  Yes Nita Sells, MD  Rivaroxaban (XARELTO) 15 MG TABS tablet Take 1 tablet (15 mg total) by mouth daily with supper. 12/21/19  Yes Almyra Deforest, PA  triamcinolone cream (KENALOG) 0.1 % Apply topically 2 (two) times daily. Patient taking differently: Apply 1 application topically 2 (two) times daily as needed (rash).  11/12/19  Yes Sheffield, Kelli R, PA-C  Vitamin D,  Ergocalciferol, (DRISDOL) 1.25 MG (50000 UT) CAPS capsule Take 1 capsule (50,000 Units total) by mouth every Wednesday. 02/18/19  Yes Vivi Barrack, MD  zolpidem (AMBIEN) 10 MG tablet Take 1 tablet (10 mg total) by mouth at bedtime as needed. for sleep 11/13/19  Yes Vivi Barrack, MD    Allergies:   Allergies  Allergen Reactions  . Hydrochlorothiazide Other (See Comments)    Pt gets hyponatremia  . Lasix [Furosemide] Other (See Comments)    Sodium levels drop when take  . Nsaids Other (See Comments)    Kidney disease/failure  . Sulfa Antibiotics Other (See Comments)    headaches    Social History:  reports that he quit smoking about 5 years ago. His smoking use included cigarettes. He has a 50.00 pack-year smoking history. He has never used smokeless tobacco. He reports current alcohol use of about 7.0 - 10.0 standard drinks of alcohol per week. He reports that he does not use drugs.  Family History: Family History  Problem Relation Age of Onset  . Atrial fibrillation Father        onset 70s. Had pacemaker placed for sinus pause  . Prostate cancer Father   . Breast cancer Mother   . Lung cancer Mother   . Leukemia Brother   . Colon polyps Brother   . Colon cancer Neg Hx   . Diabetes Neg Hx      Objective   Physical Exam: Blood pressure (!) 198/74, pulse 70, temperature 97.9 F (36.6 C), temperature source Oral, resp. rate (!) 29, height _0  (1.753 m), weight 72.6 kg, SpO2 97 %.  Physical Exam Vitals and nursing note reviewed.  Constitutional:      Appearance: Normal appearance.  HENT:     Head: Normocephalic and atraumatic.  Eyes:     Conjunctiva/sclera: Conjunctivae normal.  Cardiovascular:     Rate and Rhythm: Normal rate and regular rhythm.  Pulmonary:     Comments: Bibasilar rales Positive orthopnea Abdominal:     General: Abdomen is flat.     Palpations:  Abdomen is soft.  Musculoskeletal:        General: No swelling or tenderness.  Skin:     Coloration: Skin is not jaundiced or pale.  Neurological:     Mental Status: He is alert. Mental status is at baseline.  Psychiatric:        Mood and Affect: Mood normal.        Behavior: Behavior normal.     LABS on Admission: I have personally reviewed all the labs and imaging below    Basic Metabolic Panel: Recent Labs  Lab 03/14/20 0349  NA 136  K 4.7  CL 106  CO2 21*  GLUCOSE 172*  BUN 42*  CREATININE 2.82*  CALCIUM 8.5*   Liver Function Tests: No results for input(s): AST, ALT, ALKPHOS, BILITOT, PROT, ALBUMIN in the last 168 hours. No results for input(s): LIPASE, AMYLASE in the last 168 hours. No results for input(s): AMMONIA in the last 168 hours. CBC: Recent Labs  Lab 03/14/20 0349  WBC 7.0  HGB 7.8*  HCT 24.1*  MCV 100.4*  PLT 240   Cardiac Enzymes: No results for input(s): CKTOTAL, CKMB, CKMBINDEX, TROPONINI in the last 168 hours. BNP: Invalid input(s): POCBNP CBG: Recent Labs  Lab 03/14/20 1030  GLUCAP 183*    Radiological Exams on Admission:  DG Chest 1 View  Result Date: 03/14/2020 CLINICAL DATA:  Shortness of breath for a few hours.  Out of Lasix. EXAM: CHEST  1 VIEW COMPARISON:  11/25/2019 FINDINGS: Normal heart size. Interstitial opacity with Kerley lines and small pleural effusions. No pneumothorax. IMPRESSION: CHF. Electronically Signed   By: Monte Fantasia M.D.   On: 03/14/2020 05:09      EKG: Independently reviewed.  Sinus rhythm   A & P   Principal Problem:   Acute CHF (congestive heart failure) (HCC) Active Problems:   GI bleed   Atrial fibrillation, chronic (HCC)   CKD stage G4/A1, GFR 15-29 and albumin creatinine ratio <30 mg/g (HCC)   Hypertensive urgency   1. Acute on chronic diastolic heart failure a. Likely multifactorial: Has not been taking his Lasix for several weeks and had a lot of alcohol this past week (likely component of 'holiday heart') b. Heart failure protocol c. Continue Imdur d. Continue Lasix 40  mg twice daily e. Daily weights and I/O f. Repeat echo as he has had dyspnea on exertion for several weeks  2. GI bleed while on Xarelto a. Hold Xarelto b. Continue Cardizem c. GI consulted d. Got 1 unit of blood in the ED  3. Acute hypoxic respiratory failure likely secondary to heart failure and anemia a. Keep SpO2 greater than 92% b. Treat conditions as above  4. Hypertensive urgency a. Will take off nitro drip as he is likely experiencing tachyphylaxis b. Probably from not taking his antihypertensives including his clonidine at home for at least the past 24 hours c. Will restart his home meds including labetalol, Imdur, clonidine  5. CKD 4 a. Nephrology consult as below  6. Acute on chronic macrocytic anemia from GI bleed and anemia of kidney disease a. Gets EPO injections with nephro, reports his next dose is tomorrow b. Got a unit of blood in the ED for GI bleed c. Nephrology consult  7. Type 2 diabetes a. Hold home Januvia b. Lantus 5 units and sliding scale  8. Alcohol use a. CIWA protocol   DVT prophylaxis: scd    Code Status: Full Code  Diet: npo pending GI Family Communication: Admission,  patients condition and plan of care including tests being ordered have been discussed with the patient who indicates understanding and agrees with the plan and Code Status.  Disposition Plan: The appropriate patient status for this patient is INPATIENT. Inpatient status is judged to be reasonable and necessary in order to provide the required intensity of service to ensure the patient's safety. The patient's presenting symptoms, physical exam findings, and initial radiographic and laboratory data in the context of their chronic comorbidities is felt to place them at high risk for further clinical deterioration. Furthermore, it is not anticipated that the patient will be medically stable for discharge from the hospital within 2 midnights of admission. The following factors support  the patient status of inpatient.   " The patient's presenting symptoms include shortness of breath " The worrisome physical exam findings include hypertensive, orthopneic. " The initial radiographic and laboratory data are worrisome because of elevated BNP, anemia, positive FOBT. " The chronic co-morbidities include atrial fibrillation, HFpEF., CKD, anemia   * I certify that at the point of admission it is my clinical judgment that the patient will require inpatient hospital care spanning beyond 2 midnights from the point of admission due to high intensity of service, high risk for further deterioration and high frequency of surveillance required.*   Status is: Inpatient  Remains inpatient appropriate because:Hemodynamically unstable, Ongoing diagnostic testing needed not appropriate for outpatient work up, IV treatments appropriate due to intensity of illness or inability to take PO and Inpatient level of care appropriate due to severity of illness   Dispo: The patient is from: Home              Anticipated d/c is to: Home              Anticipated d/c date is: 3 days              Patient currently is not medically stable to d/c.        The medical decision making on this patient was of high complexity and the patient is at high risk for clinical deterioration, therefore this is a level 3  admission.  Consultants  . GI . Nephrology  Procedures  . 1 unit PRBCs  Time Spent on Admission: 82 minutes    Harold Hedge, DO [facility] Pager [phone] 03/14/2020, 11:40 AM

## 2020-03-14 NOTE — Consult Note (Signed)
Reason for Consult: Chronic kidney disease stage IV, anemia, acute exacerbation of congestive heart failure Referring Physician: Marva Panda, MD [facility])  HPI:  69 year old Caucasian man with past medical history significant for atrial fibrillation on chronic anticoagulation with Xarelto, hypertension, type 2 diabetes mellitus, diastolic heart failure (EF 55 to 60%), history of colovesicular fistula status post colectomy, history of prostate cancer, heavy/ongoing alcohol use, anemia of chronic kidney disease (on ESA) and chronic kidney disease stage IV who is under the care of Dr.Nwobu of [facility] nephrology, [place].  He presented to the emergency room with a 1 week history of increasing shortness of breath culminating in orthopnea and ankle swelling after a lapse of adherence with outpatient diuretic therapy and a higher amount of alcohol intake while on a trip to the beach.  He denies any fever or chills and does not have any cough, sputum production, hemoptysis or chest pain.  He denies any nausea, vomiting or diarrhea and does not have any melena or hematochezia.  Evaluation in the emergency room was significant for chest x-ray findings of congestive heart failure, hemoglobin of 7.8/hematocrit 24.1, creatinine 2.8, potassium 4.7, bicarbonate 21 and sodium 136.  BNP elevated at 895.  Past Medical History:  Diagnosis Date  . Alcohol withdrawal seizure ([place]) 06/28/2011   Alcohol withdraw seizure prior to admission is suspected from history given by family & Post ictal appearance in the ED.   Marland Kitchen Anxiety   . Atrial fibrillation ([place])   . Atypical mole 05/13/2008   LEFT MEDIAL CHEST SLIGHT/MOD.  Marland Kitchen Atypical nevi 05/13/2008   LEFT LATERAL CHEST SLIGHT  . Atypical nevi 04/29/2015   RIGHT POST SHOULDER MOD/SEV. TX W/S  . Atypical nevi 04/29/2015   RIGHT MID ABDOMEN MODERATE TX W/S  . Atypical nevi 09/27/2015   LEFT UPPER ARM SEVERE TX W/S  . Atypical nevi 09/27/2015   MID LOWER BACK MODERATE  .  Atypical nevi 09/27/2015   MID UPPER BACK SEVERE TX EXC  . Atypical nevi 09/27/2015   LEFT UPPER BACK MOD.SEV TX EXC  . Atypical nevi 03/23/2016   RIGHT LOWER BACK MILD  . Atypical nevi 03/23/2016   LEFT SIDE TORSO MILD  . Atypical nevi 06/14/2016   LEFT LOWER BACK MOD/SEV   . Atypical nevi 06/14/2016   LEFT CHEST MODERATE  . Atypical nevi 08/30/2017   MID UPPER BACK SUP MODERATE  . Atypical nevi 08/30/2017   RIGHT CHEST SEVERE   . Atypical nevi 07/25/2018   LEFT SHOULDER ANTERIOR TX WIDERSHAVE  . Atypical nevi 07/25/2018   LEFT SHOULDER POST MODERATE  . Atypical nevi 08/25/2019   LEFT SIDE ABDOMEN MODERATE FREE  . Atypical nevi 11/27/2019   RIGHT SHOULDER MODERATE TX W/S  . Atypical nevi 11/27/2019   RIGHT UPPER BACK MOD/SEVERE TX W/S  . Atypical nevi 11/27/2019   LEFT MID BACK SEVERE TX W/S  . Cancer [facility]) 2012   Prostate surgery  . Chronic diastolic CHF (congestive heart failure) ([place]) 11/01/2014   Echo 8/15: Mild LVH, EF 50-55%, moderate BAE  //  b. Echo 7/17: EF 55-60%, normal wall motion, grade 2 diastolic dysfunction, MAC, mild MR, moderate LAE, trivial PI  . Compression fracture of thoracic vertebra ([place]) 06/26/2011  . Diabetes mellitus type 2 in nonobese ([facility])   . Fistula, bladder   . Frequent urination at night   . History of adenomatous polyp of colon 06/14/2014  . History of nuclear stress test    a. Myoview 10/15: Overall Impression: Low risk  stress nuclear study demonstrating mild baseline ST-T changes with normal myocardial perfusion and low normal EF of 50%. // b.Myoview 7/17: EF 50%, no ischemia or scar, low risk (EF normal by recent echo)  . Hypertension   . PNA (pneumonia) 06/26/2011  . Sepsis due to urinary tract infection ([place]) 04/16/2014  . Stroke ([place]) sept 1, 2015   tia x 3    Past Surgical History:  Procedure Laterality Date  . COLON SURGERY    . COLONOSCOPY N/A 06/14/2014   Procedure: COLONOSCOPY;  Surgeon: Gatha Mayer, MD;  Location: [facility]  ENDOSCOPY;  Service: Endoscopy;  Laterality: N/A;  . CYSTOSCOPY WITH STENT PLACEMENT Bilateral 06/15/2014   Procedure: CYSTOSCOPY WITH BILATERAL STENT PLACEMENT;  Surgeon: Bernestine Amass, MD;  Location: [facility] ORS;  Service: Urology;  Laterality: Bilateral;  . LIPOMA EXCISION  2012   Dr Nonah Mattes  . moles removed     Back  . PROCTOSCOPY N/A 06/15/2014   Procedure: RIGID PROCTOSCOPY;  Surgeon: Michael Boston, MD;  Location: [facility] ORS;  Service: General;  Laterality: N/A;  . RADIOLOGY WITH ANESTHESIA N/A 07/28/2014   Procedure: Embolization;  Surgeon: Luanne Bras, MD;  Location: [facility];  Service: Radiology;  Laterality: N/A;  . ROBOT ASSISTED LAPAROSCOPIC RADICAL PROSTATECTOMY  01/31/2011   Robotic-assisted laparoscopic radical retropubic    Family History  Problem Relation Age of Onset  . Atrial fibrillation Father        onset 81s. Had pacemaker placed for sinus pause  . Prostate cancer Father   . Breast cancer Mother   . Lung cancer Mother   . Leukemia Brother   . Colon polyps Brother   . Colon cancer Neg Hx   . Diabetes Neg Hx     Social History:  reports that he quit smoking about 5 years ago. His smoking use included cigarettes. He has a 50.00 pack-year smoking history. He has never used smokeless tobacco. He reports current alcohol use of about 7.0 - 10.0 standard drinks of alcohol per week. He reports that he does not use drugs.  Allergies:  Allergies  Allergen Reactions  . Hydrochlorothiazide Other (See Comments)    Pt gets hyponatremia  . Lasix [Furosemide] Other (See Comments)    Sodium levels drop when take  . Nsaids Other (See Comments)    Kidney disease/failure  . Sulfa Antibiotics Other (See Comments)    headaches    Medications:  Scheduled: . cloNIDine  0.1 mg Oral Daily  . diltiazem  240 mg Oral Daily  . furosemide  40 mg Intravenous BID  . insulin aspart  0-9 Units Subcutaneous TID WC  . insulin glargine  5 Units Subcutaneous Daily  .  isosorbide-hydrALAZINE  2 tablet Oral BID  . labetalol  200 mg Oral Daily  . sodium chloride flush  3 mL Intravenous Q12H    BMP Latest Ref Rng & Units 03/14/2020 03/25/2019 11/25/2018  Glucose 70 - 99 mg/dL 172(H) - 92  BUN 8 - 23 mg/dL 42(H) 49(A) 45(H)  Creatinine 0.61 - 1.24 mg/dL 2.82(H) 3.4(A) 3.23(H)  Sodium 135 - 145 mmol/L 136 135(A) 132(L)  Potassium 3.5 - 5.1 mmol/L 4.7 4.2 4.0  Chloride 98 - 111 mmol/L 106 - 100  CO2 22 - 32 mmol/L 21(L) - 22  Calcium 8.9 - 10.3 mg/dL 8.5(L) - 8.4(L)   CBC Latest Ref Rng & Units 03/14/2020 03/25/2019 11/25/2018  WBC 4.0 - 10.5 Ballard/uL 7.0 6.9 6.6  Hemoglobin 13.0 - 17.0 g/dL 7.8(L) 9.6(A) 8.8(L)  Hematocrit 39 - 52 % 24.1(L) 28(A) 27.0(L)  Platelets 150 - 400 Ballard/uL 240 201 199     DG Chest 1 View  Result Date: 03/14/2020 CLINICAL DATA:  Shortness of breath for a few hours.  Out of Lasix. EXAM: CHEST  1 VIEW COMPARISON:  11/25/2019 FINDINGS: Normal heart size. Interstitial opacity with Kerley lines and small pleural effusions. No pneumothorax. IMPRESSION: CHF. Electronically Signed   By: Monte Fantasia M.D.   On: 03/14/2020 05:09    Review of Systems  Constitutional: Positive for fatigue. Negative for activity change, appetite change, chills and fever.  HENT: Negative for congestion, nosebleeds, sneezing, sore throat and trouble swallowing.   Eyes: Negative for pain, redness and visual disturbance.  Respiratory: Positive for shortness of breath. Negative for cough, choking, chest tightness and wheezing.   Cardiovascular: Positive for leg swelling. Negative for chest pain and palpitations.  Gastrointestinal: Negative for abdominal pain, blood in stool, diarrhea, nausea and vomiting.  Endocrine: Positive for polydipsia. Negative for polyuria.  Genitourinary: Negative for dysuria, hematuria and urgency.  Musculoskeletal: Negative for back pain, myalgias and neck pain.  Skin: Positive for pallor. Negative for rash.  Neurological: Positive for  weakness. Negative for dizziness, light-headedness and headaches.   Blood pressure (!) 199/80, pulse 68, temperature 97.7 F (36.5 C), temperature source Oral, resp. rate 23, height _0  (1.753 m), weight 72.6 kg, SpO2 95 %. Physical Exam Vitals and nursing note reviewed.  Constitutional:      General: He is not in acute distress.    Appearance: He is well-developed. He is ill-appearing.  HENT:     Head: Normocephalic and atraumatic.     Mouth/Throat:     Mouth: Mucous membranes are moist.     Pharynx: Oropharynx is clear. No oropharyngeal exudate.  Eyes:     Extraocular Movements: Extraocular movements intact.     Pupils: Pupils are equal, round, and reactive to light.  Neck:     Vascular: JVD present.  Cardiovascular:     Rate and Rhythm: Normal rate. Rhythm irregular.  Pulmonary:     Effort: Pulmonary effort is normal. No respiratory distress.     Breath sounds: No stridor. Examination of the right-lower field reveals rales. Examination of the left-lower field reveals rales. Rales present.  Chest:     Chest wall: No deformity or tenderness.  Abdominal:     General: Bowel sounds are normal.     Palpations: Abdomen is soft. There is no hepatomegaly.     Tenderness: There is no abdominal tenderness.  Genitourinary:    Rectum: Guaiac result positive.  Musculoskeletal:        General: Normal range of motion.     Cervical back: Normal range of motion and neck supple.  Skin:    General: Skin is warm and dry.     Coloration: Skin is pale.     Findings: No ecchymosis or rash.  Neurological:     General: No focal deficit present.     Mental Status: He is alert.     Assessment/Plan: 1.  Acute exacerbation of diastolic heart failure: This is likely secondary to his lapse of adherence with outpatient diuretic therapy as well as unrestricted fluid intake (and possibly unrestricted sodium intake during his trip to the beach).  Given his current volume status, I agree with the  dosing of furosemide at 40 mg IV twice daily with strict input and output.  He just had a 2D echocardiogram done earlier.  Contributing to  his exacerbation of congestive heart failure is his anemia and management of this will be essential to help improve/optimize cardiac output. 2.  Anemia: Multifactorial with underlying chronic kidney disease, hemodilution with congestive heart failure and possibly GI bleed in this patient with excessive alcohol use history.  GI notes reviewed-anticipating colonoscopy plus/minus EGD in the near future after CHF exacerbation clinically managed.  I will restart ESA and add on iron studies to earlier labs. 3.  Chronic kidney disease stage IV: It appears that his baseline creatinine ranges 3.5-3.8 and his current value indeed may be artificially low based on his volume status.  We will continue to trend daily labs as we undertake diuresis for volume unloading of his congestive heart failure. Avoid nephrotoxic medications including NSAIDs and iodinated intravenous contrast exposure unless the latter is absolutely indicated.  Preferred narcotic agents for pain control are hydromorphone, fentanyl, and methadone. Morphine should not be used. Avoid Baclofen and avoid oral sodium phosphate and magnesium citrate based laxatives / bowel preps. Continue strict Input and Output monitoring. Will monitor the patient closely with you and intervene or adjust therapy as indicated by changes in clinical status/labs. 4.  Hypertension: Significantly elevated blood pressures noted likely in part from his volume excess-unclear pattern of adherence to antihypertensive therapy. 5.  Chronic/heavy alcohol use: Maintained on index of suspicion for withdrawal symptoms.  On CIWA protocol.   Douglas Ballard. 03/14/2020, 1:38 PM

## 2020-03-14 NOTE — ED Triage Notes (Signed)
Patient arrived stating that over the last 5-6 hours he began feeling short of breath. Declines any pain. States he ran out of his furosemide.

## 2020-03-14 NOTE — Consult Note (Addendum)
Referring Provider:  [facility]         Primary Care Physician:  Vivi Barrack, MD Primary Gastroenterologist: Silvano Rusk, MD            We were asked to see this patient for:  Gi bleeding             ASSESSMENT /  PLAN    Douglas Ballard is a 69 y.o. male PMH significant for, but not necessarily limited to,   Diverticulosis , hx of colovesical fistula s/p colon resection, colon polyps, AFIB,  TIA, prostate cancer  S/p prostatectomy, HTN , CKD, anemia on Epo injections.                                                                                                                            # Progressive macrocytic anemia / FOBT + .   --Gets Epo injections. Hgb 9.6 one year ago, now at 7.8.  --No overt GI bleeding or focal GI symptoms. Will need EGD and colonoscopy, probably this admission. Would like to give a day or two to optimize breathing as he complains of orthopnea.  --A unit of blood already ordered.   # CHF -- elevated BNP, CHF on CXR, +  orthopnea  # AFIB on Xarelto --Last dose was Saturday.   # Hx of adenomatous colon polyps in 2015 --overdue for 5 year recall colonoscopy  # CKD, stable.  # Voluntary weight loss over last 6 months   HPI:    Chief Complaint: shortness of breath  Douglas Ballard is a 68 y.o. male with PMH as below who presented to ED this am with SOB. Evaluation felt consistent with CHF exacerbation. Anemia has gotten worse. Hgb last August was 9.6, now at 7.8. He is heme positive.  Patient denies any overt GI bleeding, no black stools. He doesn't take NSAIDS. Last Xarelto was Saturday. He denies abdominal pain, N/V. He has intentionally lost ~ 20 pounds in last 6 months. He is still having SOB, has hard time lying flat.  See Nephrologist and gets Epo injections.    PREVIOUS ENDOSCOPIC EVALUATIONS / GI STUDIES :  Oct 2015 Colonoscopy Complete exam / adequate prep --diverticulosis  --8 mm sessile polyp - Tubular adenoma   Past Medical  History:  Diagnosis Date  . Alcohol withdrawal seizure ([place]) 06/28/2011   Alcohol withdraw seizure prior to admission is suspected from history given by family & Post ictal appearance in the ED.   Marland Kitchen Anxiety   . Atrial fibrillation ([place])   . Atypical mole 05/13/2008   LEFT MEDIAL CHEST SLIGHT/MOD.  Marland Kitchen Atypical nevi 05/13/2008   LEFT LATERAL CHEST SLIGHT  . Atypical nevi 04/29/2015   RIGHT POST SHOULDER MOD/SEV. TX W/S  . Atypical nevi 04/29/2015   RIGHT MID ABDOMEN MODERATE TX W/S  . Atypical nevi 09/27/2015   LEFT UPPER ARM SEVERE TX W/S  . Atypical nevi 09/27/2015   MID LOWER BACK MODERATE  .  Atypical nevi 09/27/2015   MID UPPER BACK SEVERE TX EXC  . Atypical nevi 09/27/2015   LEFT UPPER BACK MOD.SEV TX EXC  . Atypical nevi 03/23/2016   RIGHT LOWER BACK MILD  . Atypical nevi 03/23/2016   LEFT SIDE TORSO MILD  . Atypical nevi 06/14/2016   LEFT LOWER BACK MOD/SEV   . Atypical nevi 06/14/2016   LEFT CHEST MODERATE  . Atypical nevi 08/30/2017   MID UPPER BACK SUP MODERATE  . Atypical nevi 08/30/2017   RIGHT CHEST SEVERE   . Atypical nevi 07/25/2018   LEFT SHOULDER ANTERIOR TX WIDERSHAVE  . Atypical nevi 07/25/2018   LEFT SHOULDER POST MODERATE  . Atypical nevi 08/25/2019   LEFT SIDE ABDOMEN MODERATE FREE  . Atypical nevi 11/27/2019   RIGHT SHOULDER MODERATE TX W/S  . Atypical nevi 11/27/2019   RIGHT UPPER BACK MOD/SEVERE TX W/S  . Atypical nevi 11/27/2019   LEFT MID BACK SEVERE TX W/S  . Cancer [facility]) 2012   Prostate surgery  . Chronic diastolic CHF (congestive heart failure) ([place]) 11/01/2014   Echo 8/15: Mild LVH, EF 50-55%, moderate BAE  //  b. Echo 7/17: EF 55-60%, normal wall motion, grade 2 diastolic dysfunction, MAC, mild MR, moderate LAE, trivial PI  . Compression fracture of thoracic vertebra ([place]) 06/26/2011  . Diabetes mellitus type 2 in nonobese (H[facility])   . Fistula, bladder   . Frequent urination at night   . History of adenomatous polyp of colon 06/14/2014  .  History of nuclear stress test    a. Myoview 10/15: Overall Impression: Low risk stress nuclear study demonstrating mild baseline ST-T changes with normal myocardial perfusion and low normal EF of 50%. // b.Myoview 7/17: EF 50%, no ischemia or scar, low risk (EF normal by recent echo)  . Hypertension   . PNA (pneumonia) 06/26/2011  . Sepsis due to urinary tract infection ([place]) 04/16/2014  . Stroke ([place]) sept 1, 2015   tia x 3    Past Surgical History:  Procedure Laterality Date  . COLON SURGERY    . COLONOSCOPY N/A 06/14/2014   Procedure: COLONOSCOPY;  Surgeon: Gatha Mayer, MD;  Location: [facility];  Service: Endoscopy;  Laterality: N/A;  . CYSTOSCOPY WITH STENT PLACEMENT Bilateral 06/15/2014   Procedure: CYSTOSCOPY WITH BILATERAL STENT PLACEMENT;  Surgeon: Bernestine Amass, MD;  Location: [facility];  Service: Urology;  Laterality: Bilateral;  . LIPOMA EXCISION  2012   Dr Nonah Mattes  . moles removed     Back  . PROCTOSCOPY N/A 06/15/2014   Procedure: RIGID PROCTOSCOPY;  Surgeon: Michael Boston, MD;  Location: [facility];  Service: General;  Laterality: N/A;  . RADIOLOGY WITH ANESTHESIA N/A 07/28/2014   Procedure: Embolization;  Surgeon: Luanne Bras, MD;  Location: [facility];  Service: Radiology;  Laterality: N/A;  . ROBOT ASSISTED LAPAROSCOPIC RADICAL PROSTATECTOMY  01/31/2011   Robotic-assisted laparoscopic radical retropubic    Prior to Admission medications   Medication Sig Start Date End Date Taking? Authorizing Provider  cloNIDine (CATAPRES) 0.1 MG tablet Take 0.1 mg by mouth daily. 06/26/17  Yes [provider]  diltiazem (CARDIZEM CD) 240 MG 24 hr capsule Take 240 mg by mouth daily. 01/27/20  Yes [provider]  DM-Doxylamine-Acetaminophen (NYQUIL HBP COLD & FLU) 15-6.25-325 MG/15ML LIQD Take 30 mLs by mouth at bedtime as needed (cough/sleep).   Yes [provider]  FEROSUL 325 (65 Fe) MG tablet TAKE 1 TABLET EVERY DAY Patient taking differently: Take 325  mg by  mouth daily with breakfast.  03/02/20  Yes Marin Olp, MD  furosemide (LASIX) 40 MG tablet Take 2 tablets (80 mg total) by mouth daily. 03/02/20  Yes Vivi Barrack, MD  hydrOXYzine (ATARAX/VISTARIL) 50 MG tablet TAKE 1/2-2 TABLETS BY MOUTH AT BEDTIME AS NEEDED FOR ANXIETY (INSOMNIA). Patient taking differently: Take 25-100 mg by mouth at bedtime as needed for anxiety (insomnia).  12/07/19  Yes Vivi Barrack, MD  isosorbide-hydrALAZINE (BIDIL) 20-37.5 MG tablet Take 2 tablets by mouth 2 (two) times daily.    Yes [provider]  JANUVIA 100 MG tablet TAKE 1 TABLET BY MOUTH EVERY DAY Patient taking differently: Take 100 mg by mouth daily.  01/08/20  Yes Vivi Barrack, MD  labetalol (NORMODYNE) 200 MG tablet Take 2 tablets (400 mg total) by mouth 2 (two) times daily. Patient taking differently: Take 200 mg by mouth daily.  11/26/18  Yes Nita Sells, MD  Rivaroxaban (XARELTO) 15 MG TABS tablet Take 1 tablet (15 mg total) by mouth daily with supper. 12/21/19  Yes Almyra Deforest, PA  triamcinolone cream (KENALOG) 0.1 % Apply topically 2 (two) times daily. Patient taking differently: Apply 1 application topically 2 (two) times daily as needed (rash).  11/12/19  Yes Sheffield, Kelli R, PA-C  Vitamin D, Ergocalciferol, (DRISDOL) 1.25 MG (50000 UT) CAPS capsule Take 1 capsule (50,000 Units total) by mouth every Wednesday. 02/18/19  Yes Vivi Barrack, MD  zolpidem (AMBIEN) 10 MG tablet Take 1 tablet (10 mg total) by mouth at bedtime as needed. for sleep 11/13/19  Yes Vivi Barrack, MD    Current Facility-Administered Medications  Medication Dose Route Frequency Provider Last Rate Last Admin  . 0.9 %  sodium chloride infusion  10 mL/hr Intravenous Once Gibbons, Claudia J, PA-C      . insulin aspart (novoLOG) injection 0-9 Units  0-9 Units Subcutaneous TID WC Harold Hedge, MD      . insulin glargine (LANTUS) injection 5 Units  5 Units Subcutaneous Daily Harold Hedge, MD        Current Outpatient Medications  Medication Sig Dispense Refill  . cloNIDine (CATAPRES) 0.1 MG tablet Take 0.1 mg by mouth daily.    Marland Kitchen diltiazem (CARDIZEM CD) 240 MG 24 hr capsule Take 240 mg by mouth daily.    Marland Kitchen DM-Doxylamine-Acetaminophen (NYQUIL HBP COLD & FLU) 15-6.25-325 MG/15ML LIQD Take 30 mLs by mouth at bedtime as needed (cough/sleep).    . FEROSUL 325 (65 Fe) MG tablet TAKE 1 TABLET EVERY DAY (Patient taking differently: Take 325 mg by mouth daily with breakfast. ) 90 tablet 0  . furosemide (LASIX) 40 MG tablet Take 2 tablets (80 mg total) by mouth daily. 180 tablet 1  . hydrOXYzine (ATARAX/VISTARIL) 50 MG tablet TAKE 1/2-2 TABLETS BY MOUTH AT BEDTIME AS NEEDED FOR ANXIETY (INSOMNIA). (Patient taking differently: Take 25-100 mg by mouth at bedtime as needed for anxiety (insomnia). ) 180 tablet 1  . isosorbide-hydrALAZINE (BIDIL) 20-37.5 MG tablet Take 2 tablets by mouth 2 (two) times daily.     Marland Kitchen JANUVIA 100 MG tablet TAKE 1 TABLET BY MOUTH EVERY DAY (Patient taking differently: Take 100 mg by mouth daily. ) 90 tablet 1  . labetalol (NORMODYNE) 200 MG tablet Take 2 tablets (400 mg total) by mouth 2 (two) times daily. (Patient taking differently: Take 200 mg by mouth daily. )    . Rivaroxaban (XARELTO) 15 MG TABS tablet Take 1 tablet (15 mg total) by mouth daily with supper.  90 tablet 1  . triamcinolone cream (KENALOG) 0.1 % Apply topically 2 (two) times daily. (Patient taking differently: Apply 1 application topically 2 (two) times daily as needed (rash). ) 30 g 6  . Vitamin D, Ergocalciferol, (DRISDOL) 1.25 MG (50000 UT) CAPS capsule Take 1 capsule (50,000 Units total) by mouth every Wednesday. 12 capsule 0  . zolpidem (AMBIEN) 10 MG tablet Take 1 tablet (10 mg total) by mouth at bedtime as needed. for sleep 30 tablet 5    Allergies as of 03/14/2020 - Review Complete 03/14/2020  Allergen Reaction Noted  . Hydrochlorothiazide Other (See Comments) 05/11/2014  . Lasix [furosemide]  Other (See Comments) 05/11/2014  . Nsaids Other (See Comments) 04/16/2014  . Sulfa antibiotics Other (See Comments) 06/26/2011    Family History  Problem Relation Age of Onset  . Atrial fibrillation Father        onset 76s. Had pacemaker placed for sinus pause  . Prostate cancer Father   . Breast cancer Mother   . Lung cancer Mother   . Leukemia Brother   . Colon polyps Brother   . Colon cancer Neg Hx   . Diabetes Neg Hx     Social History   Socioeconomic History  . Marital status: Married    Spouse name: Not on file  . Number of children: 2  . Years of education: Not on file  . Highest education level: Not on file  Occupational History  . Occupation: TEFL teacher  Tobacco Use  . Smoking status: Former Smoker    Packs/day: 2.00    Years: 25.00    Pack years: 50.00    Types: Cigarettes    Quit date: 04/14/2014    Years since quitting: 5.9  . Smokeless tobacco: Never Used  Vaping Use  . Vaping Use: Never used  Substance and Sexual Activity  . Alcohol use: Yes    Alcohol/week: 7.0 - 10.0 standard drinks    Types: 7 - 10 Standard drinks or equivalent per week    Comment: 2 bottles of wine with wife a week; cocktail every other night  . Drug use: No  . Sexual activity: Not Currently  Other Topics Concern  . Not on file  Social History Narrative   Fun/Hobby: Walk, cards, gardening    Social Determinants of Health   Financial Resource Strain:   . Difficulty of Paying Living Expenses:   Food Insecurity:   . Worried About Charity fundraiser in the Last Year:   . Arboriculturist in the Last Year:   Transportation Needs:   . Film/video editor (Medical):   Marland Kitchen Lack of Transportation (Non-Medical):   Physical Activity:   . Days of Exercise per Week:   . Minutes of Exercise per Session:   Stress:   . Feeling of Stress :   Social Connections:   . Frequency of Communication with Friends and Family:   . Frequency of Social Gatherings with Friends  and Family:   . Attends Religious Services:   . Active Member of Clubs or Organizations:   . Attends Archivist Meetings:   Marland Kitchen Marital Status:   Intimate Partner Violence:   . Fear of Current or Ex-Partner:   . Emotionally Abused:   Marland Kitchen Physically Abused:   . Sexually Abused:     Review of Systems: All systems reviewed and negative except where noted in HPI.  Physical Exam: Vital signs in last 24 hours: Temp:  [97.9 F (  36.6 C)] 97.9 F (36.6 C) (07/26 0341) Pulse Rate:  [55-70] 65 (07/26 0910) Resp:  [16-36] 25 (07/26 0910) BP: (184-212)/(68-95) 204/89 (07/26 0910) SpO2:  [91 %-97 %] 94 % (07/26 0910) Weight:  [72.6 kg] 72.6 kg (07/26 0341)   General:   Alert, well-developed, male in NAD Psych:  Pleasant, cooperative. Normal mood and affect. Eyes:  Pupils equal, sclera clear, no icterus.   Conjunctiva pink. Ears:  Normal auditory acuity. Nose:  No deformity, discharge,  or lesions. Neck:  Supple; no masses Lungs:  Slightly labored breathing, Decreased breath sounds bilaterally Heart:  Regular rate, trace BLE extremity edema Abdomen:  Soft, non-distended, nontender, BS active, no palp mass   Rectal:  Deferred  Msk:  Symmetrical without gross deformities. . Neurologic:  Alert and  oriented x4;  grossly normal neurologically. Skin:  Intact without significant lesions or rashes.   Intake/Output from previous day: No intake/output data recorded. Intake/Output this shift: Total I/O In: 16.2 [I.V.:16.2] Out: -   Lab Results: Recent Labs    03/14/20 0349  WBC 7.0  HGB 7.8*  HCT 24.1*  PLT 240   BMET Recent Labs    03/14/20 0349  NA 136  K 4.7  CL 106  CO2 21*  GLUCOSE 172*  BUN 42*  CREATININE 2.82*  CALCIUM 8.5*   LFT No results for input(s): PROT, ALBUMIN, AST, ALT, ALKPHOS, BILITOT, BILIDIR, IBILI in the last 72 hours. PT/INR No results for input(s): LABPROT, INR in the last 72 hours. Hepatitis Panel No results for input(s): HEPBSAG,  HCVAB, HEPAIGM, HEPBIGM in the last 72 hours.   . CBC Latest Ref Rng & Units 03/14/2020 03/25/2019 11/25/2018  WBC 4.0 - 10.5 K/uL 7.0 6.9 6.6  Hemoglobin 13.0 - 17.0 g/dL 7.8(L) 9.6(A) 8.8(L)  Hematocrit 39 - 52 % 24.1(L) 28(A) 27.0(L)  Platelets 150 - 400 K/uL 240 201 199    . CMP Latest Ref Rng & Units 03/14/2020 03/25/2019 11/25/2018  Glucose 70 - 99 mg/dL 172(H) - 92  BUN 8 - 23 mg/dL 42(H) 49(A) 45(H)  Creatinine 0.61 - 1.24 mg/dL 2.82(H) 3.4(A) 3.23(H)  Sodium 135 - 145 mmol/L 136 135(A) 132(L)  Potassium 3.5 - 5.1 mmol/L 4.7 4.2 4.0  Chloride 98 - 111 mmol/L 106 - 100  CO2 22 - 32 mmol/L 21(L) - 22  Calcium 8.9 - 10.3 mg/dL 8.5(L) - 8.4(L)  Total Protein 6.5 - 8.1 g/dL - - -  Total Bilirubin 0.3 - 1.2 mg/dL - - -  Alkaline Phos 38 - 126 U/L - - -  AST 15 - 41 U/L - - -  ALT 0 - 44 U/L - - -   Studies/Results: DG Chest 1 View  Result Date: 03/14/2020 CLINICAL DATA:  Shortness of breath for a few hours.  Out of Lasix. EXAM: CHEST  1 VIEW COMPARISON:  11/25/2019 FINDINGS: Normal heart size. Interstitial opacity with Kerley lines and small pleural effusions. No pneumothorax. IMPRESSION: CHF. Electronically Signed   By: Monte Fantasia M.D.   On: 03/14/2020 05:09    Tye Savoy, NP-C @  03/14/2020, 10:56 AM  ________________________________________________________________________  Velora Heckler GI MD note:  I personally examined the patient, reviewed the data and agree with the assessment and plan described above.  68 yo man with CHF, off lasix for at least several weeks. Now admitted with SOB, DOE and found to have heme + stools, drop in his baseline Hb (9.6 a year ago, 7.8 this morning).  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  He had a colonoscopy in 2015 and a single 65m TA was removed.    Anemia is multifactorial (dilutional from CHF exacerbation, renal from CRI, also chronic GI blood loss given heme + stool in setting of blood thinner).  Might  proceed with inpatient GI evaluation with colonoscopy and EGD pending his clinical course over the next 12-24 hours.  Will follow along.  OK for heart healthy diet for now.  DOwens Loffler MD [facility] Pager [phone]

## 2020-03-14 NOTE — ED Notes (Signed)
Verbal order from Ardis Hughs, GI MD for heart healthy diet.

## 2020-03-14 NOTE — ED Notes (Addendum)
Pt ambulated roughly 150 ft unassisted. Pt maintain O2 saturation of 97-100%. Pt did complain about SOB during the walk. Pt returned to room. Pt sat in chair in the tripod position and O2 fell to 89-92%. No feelings of being lightheaded or dizzy.

## 2020-03-14 NOTE — ED Notes (Signed)
Pt. Documented in error see above note in chart. 

## 2020-03-14 NOTE — ED Notes (Signed)
Will collect CBC 1 hour after blood is complete.

## 2020-03-14 NOTE — Progress Notes (Signed)
RT accessed patient for BIPAP. Patient had calmed down and is stable so BIPAP is on hold. MD's aware

## 2020-03-15 DIAGNOSIS — Z7901 Long term (current) use of anticoagulants: Secondary | ICD-10-CM

## 2020-03-15 DIAGNOSIS — N184 Chronic kidney disease, stage 4 (severe): Secondary | ICD-10-CM | POA: Diagnosis not present

## 2020-03-15 DIAGNOSIS — I509 Heart failure, unspecified: Secondary | ICD-10-CM | POA: Diagnosis not present

## 2020-03-15 DIAGNOSIS — E1159 Type 2 diabetes mellitus with other circulatory complications: Secondary | ICD-10-CM | POA: Diagnosis not present

## 2020-03-15 DIAGNOSIS — R195 Other fecal abnormalities: Secondary | ICD-10-CM

## 2020-03-15 DIAGNOSIS — I482 Chronic atrial fibrillation, unspecified: Secondary | ICD-10-CM | POA: Diagnosis not present

## 2020-03-15 DIAGNOSIS — D649 Anemia, unspecified: Secondary | ICD-10-CM

## 2020-03-15 DIAGNOSIS — I10 Essential (primary) hypertension: Secondary | ICD-10-CM

## 2020-03-15 DIAGNOSIS — E1122 Type 2 diabetes mellitus with diabetic chronic kidney disease: Secondary | ICD-10-CM

## 2020-03-15 LAB — TYPE AND SCREEN
ABO/RH(D): O POS
Antibody Screen: NEGATIVE
Unit division: 0

## 2020-03-15 LAB — GLUCOSE, CAPILLARY
Glucose-Capillary: 183 mg/dL — ABNORMAL HIGH (ref 70–99)
Glucose-Capillary: 179 mg/dL — ABNORMAL HIGH (ref 70–99)
Glucose-Capillary: 208 mg/dL — ABNORMAL HIGH (ref 70–99)
Glucose-Capillary: 77 mg/dL (ref 70–99)

## 2020-03-15 LAB — CBC
HCT: 22.7 % — ABNORMAL LOW (ref 39.0–52.0)
Hemoglobin: 7.6 g/dL — ABNORMAL LOW (ref 13.0–17.0)
MCH: 31.9 pg (ref 26.0–34.0)
MCHC: 33.5 g/dL (ref 30.0–36.0)
MCV: 95.4 fL (ref 80.0–100.0)
Platelets: 211 10*3/uL (ref 150–400)
RBC: 2.38 MIL/uL — ABNORMAL LOW (ref 4.22–5.81)
RDW: 14.4 % (ref 11.5–15.5)
WBC: 9.4 10*3/uL (ref 4.0–10.5)
nRBC: 0 % (ref 0.0–0.2)

## 2020-03-15 LAB — BPAM RBC
Blood Product Expiration Date: 202108242359
ISSUE DATE / TIME: 202107261141
Unit Type and Rh: 5100

## 2020-03-15 LAB — RENAL FUNCTION PANEL
Albumin: 3.2 g/dL — ABNORMAL LOW (ref 3.5–5.0)
Anion gap: 10 (ref 5–15)
BUN: 46 mg/dL — ABNORMAL HIGH (ref 8–23)
CO2: 18 mmol/L — ABNORMAL LOW (ref 22–32)
Calcium: 8.6 mg/dL — ABNORMAL LOW (ref 8.9–10.3)
Chloride: 104 mmol/L (ref 98–111)
Creatinine, Ser: 2.9 mg/dL — ABNORMAL HIGH (ref 0.61–1.24)
GFR calc Af Amer: 25 mL/min — ABNORMAL LOW (ref >60)
GFR calc non Af Amer: 21 mL/min — ABNORMAL LOW (ref >60)
Glucose, Bld: 219 mg/dL — ABNORMAL HIGH (ref 70–99)
Phosphorus: 5 mg/dL — ABNORMAL HIGH (ref 2.5–4.6)
Potassium: 4.2 mmol/L (ref 3.5–5.1)
Sodium: 132 mmol/L — ABNORMAL LOW (ref 135–145)

## 2020-03-15 LAB — MAGNESIUM: Magnesium: 1.8 mg/dL (ref 1.7–2.4)

## 2020-03-15 MED ORDER — THIAMINE HCL 100 MG PO TABS
100.0000 mg | ORAL_TABLET | Freq: Every day | ORAL | Status: DC
  Administered 2020-03-15 – 2020-03-17 (×3): 100 mg via ORAL
  Filled 2020-03-15 (×3): qty 1

## 2020-03-15 MED ORDER — PEG-KCL-NACL-NASULF-NA ASC-C 100 G PO SOLR: 1.0000 | Freq: Once | ORAL | Status: DC

## 2020-03-15 MED ORDER — FOLIC ACID 1 MG PO TABS
1.0000 mg | ORAL_TABLET | Freq: Every day | ORAL | Status: DC
  Administered 2020-03-15 – 2020-03-17 (×3): 1 mg via ORAL
  Filled 2020-03-15 (×3): qty 1

## 2020-03-15 MED ORDER — LORAZEPAM 2 MG/ML IJ SOLN: 1.0000 mg | INTRAMUSCULAR | Status: DC | PRN

## 2020-03-15 MED ORDER — LORAZEPAM 1 MG PO TABS: 1.0000 mg | ORAL_TABLET | ORAL | Status: DC | PRN

## 2020-03-15 MED ORDER — ADULT MULTIVITAMIN W/MINERALS CH
1.0000 | ORAL_TABLET | Freq: Every day | ORAL | Status: DC
  Administered 2020-03-15 – 2020-03-17 (×3): 1 via ORAL
  Filled 2020-03-15 (×3): qty 1

## 2020-03-15 MED ORDER — PEG-KCL-NACL-NASULF-NA ASC-C 100 G PO SOLR
0.5000 | Freq: Once | ORAL | Status: AC
  Administered 2020-03-15: 100 g via ORAL
  Filled 2020-03-15: qty 1

## 2020-03-15 MED ORDER — THIAMINE HCL 100 MG/ML IJ SOLN: 100.0000 mg | Freq: Every day | INTRAMUSCULAR | Status: DC

## 2020-03-15 MED ORDER — PEG-KCL-NACL-NASULF-NA ASC-C 100 G PO SOLR
0.5000 | Freq: Once | ORAL | Status: AC
  Administered 2020-03-16: 100 g via ORAL

## 2020-03-15 NOTE — Progress Notes (Signed)
[facility]  PROGRESS NOTE  Douglas Ballard SWN:[id number] DOB: 1951-06-14 DOA: 03/14/2020 PCP: Douglas Barrack, MD Admit date - 03/14/2020   Admitting Physician Harold Hedge, MD  Outpatient Primary MD for the patient is Douglas Barrack, MD  LOS - 1 Brief Narrative   Douglas Ballard is a 69 y.o. year old male with medical history significant for CKD, anemia on EPO injections with nephrology  diabetes, tobacco use, atrial fibrillation on Xarelto, anemia, HFpEF last EF 55 to 60%, colovesicular fistula s/p colectomy, alcohol use who presented to the ED on 03/14/2020 with shortness of breath for the last week after being off his Lasix for the past month.  Patient admits to heavy binge drinking for the past week (half a gallon of vodka over the week), with slowly worsening dyspnea on exertion to the point where patient was unable to sleep unless sitting upright  night prior to admission.  In the ED patient was afebrile, tachypneic admitting blood pressure of 206/68.  Lab work notable for fecal occult positive, Covid test negative, creatinine 2.8 (baseline 3.2-3.4), hemoglobin 7.8 (baseline 9.4), BNP 895.  Chest x-ray concerning for pleural effusions and curly B-lines consistent with CHF.  Patient was initially started on IV Lasix 40 g x 1 and nitro infusion.  Blood pressure     Subjective  Today breathing is much improved, denies any chest pain.  A & P  Acute on chronic CHF with preserved EF exacerbation, improving.  Occurring in the setting of nonadherence to Lasix regimen, high salt/fluid intake, recent alcohol binge.  Elevated BNP, currently B-lines/pleural effusion on chest x-ray, significant dyspnea with minimal exertion on admission consistent with flare.  Improving with IV Lasix diuresis as patient is less dyspneic, and is overloaded on exam. -Continue IV Lasix 40 mg twice daily for persistent crackles and for breast volume optimization before endoscopic evaluation by GI -Daily weights,  strict I's and O's  Active alcohol abuse.  Patient admits last drink either Sunday before admission.  History of withdrawal several years ago in hospital.  No current signs or symptoms of withdrawal currently -Continue to monitor closely on CIWA protocol, Ativan as needed -Thiamine, folic acid, multivitamin  Acute on chronic anemia.  Anemia multifactorial including decreased EPO from CKD, alcohol abuse (bone marrow suppression/poor diet) baseline hemoglobin of 9, down to 7.8 on admission with Hemoccult stool in setting of Xarelto use, otherwise no GI complaints -N.p.o. at midnight for plan EGD/colonoscopy per GI, clear liquids graft   Atrial fibrillation, currently rate controlled.  In normal sinus rhythm. -Holding home Xarelto -Continue labetalol/diltiazem  Hypertensive urgency, improved.  Urgency resolved no longer requiring nitro drip in the setting of tachyphylaxis.  Blood pressure quite hypertensive on admission in setting of nonadherence/rebound hypertension related to clonidine.  Much improved in the setting of diuresis and reinitiation of home BP meds -Continue home clonidine, diltiazem, labetalol --Continue BiDil  CKD stage IV.  Creatinine actually improved from baseline.  Tolerating diuresis -Nephrology following, agreed with continued IV Lasix for additional 24 to 48 hours before transitioning to orals -Avoid nephrotoxins -Monitor BMP  Type 2 diabetes, well controlled, A1c 5.9% -Hold home Januvia -Lantus 5 units, sliding scale, monitor CBG     Family Communication  : Wife updated at bedside  Code Status : Full code  Disposition Plan  :  Patient is from home. Anticipated d/c date: 2 to 3 days. Barriers to d/c or necessity for inpatient status: IV diuresis, EGD/colonoscopy  Consults  : GI, nephrology  Procedures  : TTE, 7/26, EGD/colonoscopy pending  DVT Prophylaxis  : SCDs, holding home Xarelto  Lab Results  Component Value Date   PLT 211 03/15/2020    Diet :   Diet Order            Diet NPO time specified Except for: Sips with Meds  Diet effective midnight           Diet clear liquid Room service appropriate? Yes; Fluid consistency: Thin  Diet effective now                  Inpatient Medications Scheduled Meds: . cloNIDine  0.1 mg Oral Daily  . diltiazem  240 mg Oral Daily  . folic acid  1 mg Oral Daily  . furosemide  40 mg Intravenous BID  . insulin aspart  0-9 Units Subcutaneous TID WC  . insulin glargine  5 Units Subcutaneous Daily  . isosorbide-hydrALAZINE  2 tablet Oral BID  . labetalol  200 mg Oral Daily  . multivitamin with minerals  1 tablet Oral Daily  . peg 3350 powder  0.5 kit Oral Once   And  . [START ON 03/16/2020] peg 3350 powder  0.5 kit Oral Once  . sodium chloride flush  3 mL Intravenous Q12H  . thiamine  100 mg Oral Daily   Or  . thiamine  100 mg Intravenous Daily   Continuous Infusions: . sodium chloride     PRN Meds:.sodium chloride, acetaminophen, ALPRAZolam, LORazepam **OR** LORazepam, ondansetron (ZOFRAN) IV, sodium chloride flush  Antibiotics  :   Anti-infectives (From admission, onward)   None       Objective   Vitals:   03/14/20 1834 03/14/20 2134 03/15/20 0513 03/15/20 1304  BP:  (!) 160/72 (!) 166/63 (!) 161/69  Pulse:  70 63 56  Resp:  _0 Temp:  98.5 F (36.9 C) 98.2 F (36.8 C) 98.5 F (36.9 C)  TempSrc:  Oral Oral Oral  SpO2:  95% 96% 94%  Weight: 73.1 kg     Height: _1  (1.753 m)       SpO2: 94 %  Wt Readings from Last 3 Encounters:  03/14/20 73.1 kg  02/15/20 73 kg  06/29/19 79.5 kg     Intake/Output Summary (Last 24 hours) at 03/15/2020 1646 Last data filed at 03/15/2020 1420 Gross per 24 hour  Intake 1080 ml  Output 2575 ml  Net -1495 ml    Physical Exam:     Awake Alert, Oriented X 3, Normal affect No new F.N deficits,  Roland.AT, Normal respiratory effort on room air, crackles at bases to mid lungs RRR,No Gallops, no peripheral edema, no  appreciable JVD +ve B.Sounds, Abd Soft, No tenderness, No rebound, guarding or rigidity. No Cyanosis, No new Rash or bruise     I have personally reviewed the following:   Data Reviewed:  CBC Recent Labs  Lab 03/14/20 0349 03/15/20 0522  WBC 7.0 9.4  HGB 7.8* 7.6*  HCT 24.1* 22.7*  PLT 240 211  MCV 100.4* 95.4  MCH 32.5 31.9  MCHC 32.4 33.5  RDW 12.9 14.4    Chemistries  Recent Labs  Lab 03/14/20 0349 03/15/20 0522  NA 136 132*  K 4.7 4.2  CL 106 104  CO2 21* 18*  GLUCOSE 172* 219*  BUN 42* 46*  CREATININE 2.82* 2.90*  CALCIUM 8.5* 8.6*  MG  --  1.8   ------------------------------------------------------------------------------------------------------------------ No results for input(s): CHOL, HDL, LDLCALC, TRIG, CHOLHDL, LDLDIRECT  in the last 72 hours.  Lab Results  Component Value Date   HGBA1C 5.9 (H) 03/14/2020   ------------------------------------------------------------------------------------------------------------------ No results for input(s): TSH, T4TOTAL, T3FREE, THYROIDAB in the last 72 hours.  Invalid input(s): FREET3 ------------------------------------------------------------------------------------------------------------------ Recent Labs    03/14/20 1430  FERRITIN 46  TIBC 249*  IRON 65    Coagulation profile No results for input(s): INR, PROTIME in the last 168 hours.  No results for input(s): DDIMER in the last 72 hours.  Cardiac Enzymes No results for input(s): CKMB, TROPONINI, MYOGLOBIN in the last 168 hours.  Invalid input(s): CK ------------------------------------------------------------------------------------------------------------------    Component Value Date/Time   BNP 895.2 (H) 03/14/2020 0349   BNP 395.1 (H) 02/07/2016 1457    Micro Results Recent Results (from the past 240 hour(s))  SARS Coronavirus 2 by RT PCR (hospital order, performed in [facility] hospital lab) Nasopharyngeal Nasopharyngeal Swab      Status: None   Collection Time: 03/14/20  5:03 AM   Specimen: Nasopharyngeal Swab  Result Value Ref Range Status   SARS Coronavirus 2 NEGATIVE NEGATIVE Final    Comment: (NOTE) SARS-CoV-2 target nucleic acids are NOT DETECTED.  The SARS-CoV-2 RNA is generally detectable in upper and lower respiratory specimens during the acute phase of infection. The lowest concentration of SARS-CoV-2 viral copies this assay can detect is 250 copies / mL. A negative result does not preclude SARS-CoV-2 infection and should not be used as the sole basis for treatment or other patient management decisions.  A negative result may occur with improper specimen collection / handling, submission of specimen other than nasopharyngeal swab, presence of viral mutation(s) within the areas targeted by this assay, and inadequate number of viral copies (<250 copies / mL). A negative result must be combined with clinical observations, patient history, and epidemiological information.  Fact Sheet for Patients:   StrictlyIdeas.no  Fact Sheet for Healthcare Providers: BankingDealers.co.za  This test is not yet approved or  cleared by the [place] FDA and has been authorized for detection and/or diagnosis of SARS-CoV-2 by FDA under an Emergency Use Authorization (EUA).  This EUA will remain in effect (meaning this test can be used) for the duration of the COVID-19 declaration under Section 564(b)(1) of the Act, 21 U.S.C. section 360bbb-3(b)(1), unless the authorization is terminated or revoked sooner.  Performed at [facility], [place] [address] [place], [place] [address]     Radiology Reports DG Chest 1 View  Result Date: 03/14/2020 CLINICAL DATA:  Shortness of breath for a few hours.  Out of Lasix. EXAM: CHEST  1 VIEW COMPARISON:  11/25/2019 FINDINGS: Normal heart size. Interstitial opacity with Kerley lines and small pleural effusions. No  pneumothorax. IMPRESSION: CHF. Electronically Signed   By: Monte Fantasia M.D.   On: 03/14/2020 05:09   ECHOCARDIOGRAM COMPLETE  Result Date: 03/14/2020    ECHOCARDIOGRAM REPORT   Patient Name:   Douglas Ballard Date of Exam: 03/14/2020 Medical Rec #:  [id number]      Height:       69.0 in Accession #:    [id number]     Weight:       160.0 lb Date of Birth:  [birth year]     BSA:          1.879 m Patient Age:    32 years       BP:           198/83 mmHg Patient Gender: M  HR:           67 bpm. Exam Location:  Inpatient Procedure: 2D Echo, Cardiac Doppler and Color Doppler Indications:    R06.02 SOB; S28.76 Acute diastolic (congestive) heart failure  History:        Patient has prior history of Echocardiogram examinations, most                 recent 08/31/2018. CHF, Abnormal ECG, Arrythmias:Atrial                 Fibrillation, Signs/Symptoms:Dyspnea; Risk Factors:Current                 Smoker.  Sonographer:    Roseanna Rainbow RDCS Referring Phys: [id number] Harold Hedge  Sonographer Comments: Technically difficult study due to poor echo windows. Patient in high Fowler's position due to dyspnea. IMPRESSIONS  1. Left ventricular ejection fraction, by estimation, is 55 to 60%. The left ventricle has normal function. The left ventricle has no regional wall motion abnormalities. There is moderate left ventricular hypertrophy. Left ventricular diastolic parameters are consistent with Grade III diastolic dysfunction (restrictive). Elevated left atrial pressure.  2. Right ventricular systolic function is normal. The right ventricular size is mildly enlarged. Tricuspid regurgitation signal is inadequate for assessing PA pressure.  3. Left atrial size was moderately dilated.  4. Right atrial size was mild to moderately dilated.  5. The mitral valve is grossly normal. Mild mitral valve regurgitation. No evidence of mitral stenosis.  6. The aortic valve is tricuspid. Aortic valve regurgitation is trivial. No aortic stenosis  is present. Comparison(s): No significant change from prior study. FINDINGS  Left Ventricle: Left ventricular ejection fraction, by estimation, is 55 to 60%. The left ventricle has normal function. The left ventricle has no regional wall motion abnormalities. The left ventricular internal cavity size was normal in size. There is  moderate left ventricular hypertrophy. Left ventricular diastolic parameters are consistent with Grade III diastolic dysfunction (restrictive). Elevated left atrial pressure. Right Ventricle: The right ventricular size is mildly enlarged. No increase in right ventricular wall thickness. Right ventricular systolic function is normal. Tricuspid regurgitation signal is inadequate for assessing PA pressure. Left Atrium: Left atrial size was moderately dilated. Right Atrium: Right atrial size was mild to moderately dilated. Pericardium: Trivial pericardial effusion is present. Presence of pericardial fat pad. Mitral Valve: The mitral valve is grossly normal. There is mild thickening of the anterior and posterior mitral valve leaflet(s). Mild mitral valve regurgitation. No evidence of mitral valve stenosis. Tricuspid Valve: The tricuspid valve is grossly normal. Tricuspid valve regurgitation is mild . No evidence of tricuspid stenosis. Aortic Valve: The aortic valve is tricuspid. Aortic valve regurgitation is trivial. No aortic stenosis is present. Pulmonic Valve: The pulmonic valve was grossly normal. Pulmonic valve regurgitation is not visualized. No evidence of pulmonic stenosis. Aorta: The aortic root is normal in size and structure. IAS/Shunts: The atrial septum is grossly normal. Additional Comments: There is a small pleural effusion in both left and right lateral regions.  LEFT VENTRICLE PLAX 2D LVIDd:         5.42 cm      Diastology LVIDs:         3.79 cm      LV e' lateral:   7.43 cm/s LV PW:         1.61 cm      LV E/e' lateral: 17.9 LV IVS:        1.52 cm  LV e' medial:    4.93  cm/s LVOT diam:     2.30 cm      LV E/e' medial:  27.0 LV SV:         135 LV SV Index:   72 LVOT Area:     4.15 cm  LV Volumes (MOD) LV vol d, MOD A2C: 133.0 ml LV vol d, MOD A4C: 128.0 ml LV vol s, MOD A2C: 60.1 ml LV vol s, MOD A4C: 50.1 ml LV SV MOD A2C:     72.9 ml LV SV MOD A4C:     128.0 ml LV SV MOD BP:      74.2 ml RIGHT VENTRICLE             IVC RV S prime:     12.90 cm/s  IVC diam: 2.50 cm TAPSE (M-mode): 2.3 cm LEFT ATRIUM             Index       RIGHT ATRIUM           Index LA diam:        5.30 cm 2.82 cm/m  RA Area:     23.80 cm LA Vol (A2C):   60.0 ml 31.93 ml/m RA Volume:   85.90 ml  45.71 ml/m LA Vol (A4C):   83.9 ml 44.65 ml/m LA Biplane Vol: 72.1 ml 38.37 ml/m  AORTIC VALVE LVOT Vmax:   140.00 cm/s LVOT Vmean:  87.500 cm/s LVOT VTI:    0.326 m  AORTA Ao Root diam: 3.30 cm MITRAL VALVE MV Area (PHT): 5.20 cm     SHUNTS MV Decel Time: 146 msec     Systemic VTI:  0.33 m MV E velocity: 133.00 cm/s  Systemic Diam: 2.30 cm MV A velocity: 66.50 cm/s MV E/A ratio:  2.00 Eleonore Chiquito MD Electronically signed by Eleonore Chiquito MD Signature Date/Time: 03/14/2020/4:32:36 PM    Final      Time Spent in minutes  30     Desiree Hane M.D on 03/15/2020 at 4:46 PM  To page go to [email or web address] - password [facility]

## 2020-03-15 NOTE — Evaluation (Signed)
Physical Therapy Evaluation-1x Patient Details Name: Douglas Ballard MRN: [id number] DOB: May 21, 1951 Today's Date: 03/15/2020   History of Present Illness  69 yo male admitted with acute CHF, GI bleed. Hx of CKD, anemia, DM, Afib, HF, ETOH use, TIA  Clinical Impression  On eval, pt was Mod Ind with mobility. He walked ~250 feet around the unit. Wheezing and dyspnea 3/4 with ambulation. O2>90% on RA. Pt does not need PT to follow but he needs to ambulate in hallway daily as tolerated. 1x eval. Will sign off.     Follow Up Recommendations No PT follow up    Equipment Recommendations  None recommended by PT    Recommendations for Other Services       Precautions / Restrictions Precautions Precautions: None Restrictions Weight Bearing Restrictions: No      Mobility  Bed Mobility Overal bed mobility: Modified Independent                Transfers Overall transfer level: Modified independent                  Ambulation/Gait Ambulation/Gait assistance: Modified independent (Device/Increase time) Gait Distance (Feet): 250 Feet         General Gait Details: Wheezing and dyspnea 3/4. O2 >90% on RA.  Stairs            Wheelchair Mobility    Modified Rankin (Stroke Patients Only)       Balance Overall balance assessment: Modified Independent                                           Pertinent Vitals/Pain Pain Assessment: No/denies pain    Home Living Family/patient expects to be discharged to:: Private residence Living Arrangements: Spouse/significant other Available Help at Discharge: Family Type of Home: House Home Access: Stairs to enter   Technical brewer of Steps: 1 flight Home Layout: Multi-level;Able to live on main level with bedroom/bathroom Home Equipment: None      Prior Function Level of Independence: Independent               Hand Dominance        Extremity/Trunk Assessment   Upper  Extremity Assessment Upper Extremity Assessment: Overall WFL for tasks assessed    Lower Extremity Assessment Lower Extremity Assessment: Overall WFL for tasks assessed    Cervical / Trunk Assessment Cervical / Trunk Assessment: Normal  Communication   Communication: No difficulties  Cognition Arousal/Alertness: Awake/alert Behavior During Therapy: WFL for tasks assessed/performed Overall Cognitive Status: Within Functional Limits for tasks assessed                                        General Comments      Exercises     Assessment/Plan    PT Assessment Patent does not need any further PT services  PT Problem List         PT Treatment Interventions      PT Goals (Current goals can be found in the Care Plan section)  Acute Rehab PT Goals Patient Stated Goal: to get better PT Goal Formulation: All assessment and education complete, DC therapy    Frequency     Barriers to discharge        Co-evaluation  AM-PAC PT "6 Clicks" Mobility  Outcome Measure Help needed turning from your back to your side while in a flat bed without using bedrails?: None Help needed moving from lying on your back to sitting on the side of a flat bed without using bedrails?: None Help needed moving to and from a bed to a chair (including a wheelchair)?: None Help needed standing up from a chair using your arms (e.g., wheelchair or bedside chair)?: None Help needed to walk in hospital room?: None Help needed climbing 3-5 steps with a railing? : None 6 Click Score: 24    End of Session   Activity Tolerance: Patient limited by fatigue (limited by dyspnea) Patient left: in bed;with call bell/phone within reach        Time: 1010-1021 PT Time Calculation (min) (ACUTE ONLY): 11 min   Charges:   PT Evaluation $PT Eval Low Complexity: McGrath, PT Acute Rehabilitation  Office: [phone] Pager: [phone]

## 2020-03-15 NOTE — H&P (View-Only) (Signed)
Progress Note  CC:  anemia        ASSESSMENT AND PLAN:    [name] is a 69 y.o. male PMH significant for, but not necessarily limited to,   Diverticulosis , hx of colovesical fistula s/p colon resection, colon polyps, AFIB,  TIA, prostate cancer  S/p prostatectomy, HTN , CKD, anemia on Epo injections.                                                                                                                            # Progressive macrocytic anemia / FOBT positive on Xarelto --Gets Epo injections. Hgb 9.6 one year ago, now at 7.8.  --Doesn't appear iron deficient with ferritin of 46, normal % sat, low TIBC --No overt GI bleeding or focal GI symptoms. Will need EGD and colonoscopy, probably this admission. Would like to give a day or two to optimize breathing as he complains of orthopnea / admitted with CHF.  --Received  1 u PRBC yesterday.  Follow up Hgb collected but not resulted.  --Plan is for EGD / colonoscopy tomorrow.  The risks and benefits of EGD and colonoscopy with possible polypectomy / biopsies were discussed and the patient agrees to proceed.  --I plan to reassess him later today, breathing still seems a little labored today. If need be we can push endoscopic evaluation off another day.   # CHF --elevated BNP, CHF on CXR.  --Echo yesterday > grade III DD, normal EF --Diuresing on lasix. Urine output 2475 ml yesterday.  --He feels breathing has improved today.   # AFIB on Xarelto --Last dose was Saturday.   # Etoh use --on CIWA. Doesn't appear to have any withdrawal symptoms.   # Hx of adenomatous colon polyps in 2015 --For ~ 7 year recall based on new polyp surveillance guidelines.   # CKD. Nephrology following  # Voluntary weight loss over last 6 months       SUBJECTIVE   Breathing a little better compared to yesterday. No GI complaints.     OBJECTIVE:     Vital signs in last 24 hours: Temp:  [97.7 F (36.5 C)-98.5 F (36.9 C)] 98.2  F (36.8 C) (07/27 0513) Pulse Rate:  [58-78] 63 (07/27 0513) Resp:  [16-31] 20 (07/27 0513) BP: (148-211)/(61-89) 166/63 (07/27 0513) SpO2:  [93 %-98 %] 96 % (07/27 0513) Weight:  [73.1 kg] 73.1 kg (07/26 1834) Last BM Date: 03/14/20 General:   Alert, in NAD Heart:  Regular rate.  No lower extremity edema   Pulm: Normal respiratory effort.    Abdomen:  Soft,  nontender, nondistended.  Normal bowel sounds.          Neurologic:  Alert and  oriented,  grossly normal neurologically. Psych:  Pleasant, cooperative.  Normal mood and affect.   Intake/Output from previous day: 07/26 0701 - 07/27 0700 In: 1022.2 [P.O.:600; I.V.:16.2; Blood:406] Out: 2475 [Urine:2475] Intake/Output this shift: No intake/output data recorded.  Lab Results: Recent Labs    03/14/20 0349 03/15/20 0522  WBC 7.0 9.4  HGB 7.8* 7.6*  HCT 24.1* 22.7*  PLT 240 211   BMET Recent Labs    03/14/20 0349 03/15/20 0522  NA 136 132*  K 4.7 4.2  CL 106 104  CO2 21* 18*  GLUCOSE 172* 219*  BUN 42* 46*  CREATININE 2.82* 2.90*  CALCIUM 8.5* 8.6*   LFT Recent Labs    03/15/20 0522  ALBUMIN 3.2*   PT/INR No results for input(s): LABPROT, INR in the last 72 hours. Hepatitis Panel No results for input(s): HEPBSAG, HCVAB, HEPAIGM, HEPBIGM in the last 72 hours.  DG Chest 1 View  Result Date: 03/14/2020 CLINICAL DATA:  Shortness of breath for a few hours.  Out of Lasix. EXAM: CHEST  1 VIEW COMPARISON:  11/25/2019 FINDINGS: Normal heart size. Interstitial opacity with Kerley lines and small pleural effusions. No pneumothorax. IMPRESSION: CHF. Electronically Signed   By: Monte Fantasia M.D.   On: 03/14/2020 05:09   ECHOCARDIOGRAM COMPLETE  Result Date: 03/14/2020    ECHOCARDIOGRAM REPORT   Patient Name:   Douglas Ballard Date of Exam: 03/14/2020 Medical Rec #:  [id number]      Height:       69.0 in Accession #:    [id number]     Weight:       160.0 lb Date of Birth:  December 06, 1950     BSA:          1.879 m  Patient Age:    55 years       BP:           198/83 mmHg Patient Gender: M              HR:           67 bpm. Exam Location:  Inpatient Procedure: 2D Echo, Cardiac Doppler and Color Doppler Indications:    R06.02 SOB; T61.44 Acute diastolic (congestive) heart failure  History:        Patient has prior history of Echocardiogram examinations, most                 recent 08/31/2018. CHF, Abnormal ECG, Arrythmias:Atrial                 Fibrillation, Signs/Symptoms:Dyspnea; Risk Factors:Current                 Smoker.  Sonographer:    Roseanna Rainbow RDCS Referring Phys: [id number] Harold Hedge  Sonographer Comments: Technically difficult study due to poor echo windows. Patient in high Fowler's position due to dyspnea. IMPRESSIONS  1. Left ventricular ejection fraction, by estimation, is 55 to 60%. The left ventricle has normal function. The left ventricle has no regional wall motion abnormalities. There is moderate left ventricular hypertrophy. Left ventricular diastolic parameters are consistent with Grade III diastolic dysfunction (restrictive). Elevated left atrial pressure.  2. Right ventricular systolic function is normal. The right ventricular size is mildly enlarged. Tricuspid regurgitation signal is inadequate for assessing PA pressure.  3. Left atrial size was moderately dilated.  4. Right atrial size was mild to moderately dilated.  5. The mitral valve is grossly normal. Mild mitral valve regurgitation. No evidence of mitral stenosis.  6. The aortic valve is tricuspid. Aortic valve regurgitation is trivial. No aortic stenosis is present. Comparison(s): No significant change from prior study. FINDINGS  Left Ventricle: Left ventricular ejection fraction, by estimation, is 55 to 60%. The left ventricle has normal  function. The left ventricle has no regional wall motion abnormalities. The left ventricular internal cavity size was normal in size. There is  moderate left ventricular hypertrophy. Left ventricular diastolic  parameters are consistent with Grade III diastolic dysfunction (restrictive). Elevated left atrial pressure. Right Ventricle: The right ventricular size is mildly enlarged. No increase in right ventricular wall thickness. Right ventricular systolic function is normal. Tricuspid regurgitation signal is inadequate for assessing PA pressure. Left Atrium: Left atrial size was moderately dilated. Right Atrium: Right atrial size was mild to moderately dilated. Pericardium: Trivial pericardial effusion is present. Presence of pericardial fat pad. Mitral Valve: The mitral valve is grossly normal. There is mild thickening of the anterior and posterior mitral valve leaflet(s). Mild mitral valve regurgitation. No evidence of mitral valve stenosis. Tricuspid Valve: The tricuspid valve is grossly normal. Tricuspid valve regurgitation is mild . No evidence of tricuspid stenosis. Aortic Valve: The aortic valve is tricuspid. Aortic valve regurgitation is trivial. No aortic stenosis is present. Pulmonic Valve: The pulmonic valve was grossly normal. Pulmonic valve regurgitation is not visualized. No evidence of pulmonic stenosis. Aorta: The aortic root is normal in size and structure. IAS/Shunts: The atrial septum is grossly normal. Additional Comments: There is a small pleural effusion in both left and right lateral regions.  LEFT VENTRICLE PLAX 2D LVIDd:         5.42 cm      Diastology LVIDs:         3.79 cm      LV e' lateral:   7.43 cm/s LV PW:         1.61 cm      LV E/e' lateral: 17.9 LV IVS:        1.52 cm      LV e' medial:    4.93 cm/s LVOT diam:     2.30 cm      LV E/e' medial:  27.0 LV SV:         135 LV SV Index:   72 LVOT Area:     4.15 cm  LV Volumes (MOD) LV vol d, MOD A2C: 133.0 ml LV vol d, MOD A4C: 128.0 ml LV vol s, MOD A2C: 60.1 ml LV vol s, MOD A4C: 50.1 ml LV SV MOD A2C:     72.9 ml LV SV MOD A4C:     128.0 ml LV SV MOD BP:      74.2 ml RIGHT VENTRICLE             IVC RV S prime:     12.90 cm/s  IVC diam: 2.50  cm TAPSE (M-mode): 2.3 cm LEFT ATRIUM             Index       RIGHT ATRIUM           Index LA diam:        5.30 cm 2.82 cm/m  RA Area:     23.80 cm LA Vol (A2C):   60.0 ml 31.93 ml/m RA Volume:   85.90 ml  45.71 ml/m LA Vol (A4C):   83.9 ml 44.65 ml/m LA Biplane Vol: 72.1 ml 38.37 ml/m  AORTIC VALVE LVOT Vmax:   140.00 cm/s LVOT Vmean:  87.500 cm/s LVOT VTI:    0.326 m  AORTA Ao Root diam: 3.30 cm MITRAL VALVE MV Area (PHT): 5.20 cm     SHUNTS MV Decel Time: 146 msec     Systemic VTI:  0.33 m MV E velocity: 133.00 cm/s  Systemic Diam: 2.30 cm MV A velocity: 66.50 cm/s MV E/A ratio:  2.00 Eleonore Chiquito MD Electronically signed by Eleonore Chiquito MD Signature Date/Time: 03/14/2020/4:32:36 PM    Final      Principal Problem:   Acute CHF (congestive heart failure) ([place]) Active Problems:   GI bleed   Atrial fibrillation, chronic ([facility])   CKD stage G4/A1, GFR 15-29 and albumin 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

## 2020-03-15 NOTE — Progress Notes (Signed)
Followed up with patient regarding shortness of breath. Breathing is better this afternoon, he feels up to proceeding with EGD / colonoscopy tomorrow.

## 2020-03-15 NOTE — Progress Notes (Signed)
Patient ID: Douglas Ballard, male   DOB: 05/10/1951, 69 y.o.   MRN: [id number] [facility] Progress Note   Assessment/ Plan:   1.  Acute exacerbation of diastolic heart failure: This is likely secondary to his lapse of adherence with outpatient diuretic therapy as well as unrestricted fluid intake (and possibly unrestricted sodium intake during his trip to the beach).    Excellent urine output in response to intravenous furosemide overnight; will continue the current dose of furosemide and consider transition to oral diuretic therapy in the next 24 hours if net negative fluid balance is obtained.  I will order for daily weights. 2.  Anemia: Multifactorial with underlying chronic kidney disease, hemodilution with congestive heart failure and possibly GI bleed in this patient with excessive alcohol use history.  GI notes reviewed-anticipating colonoscopy plus/minus EGD in the near future after dyspnea improves and allows him to lay flat.  He has adequate iron stores and I will order for Aranesp today. 3.  Chronic kidney disease stage IV: Tolerating diuresis without problems, renal function essentially stable without acute electrolyte abnormalities.  Continue current dose of intravenous furosemide for at least 24 to 48 hours and thereafter transition to oral diuretic. 4.  Hypertension:  Blood pressures improving with ongoing diuresis and reinitiation of oral antihypertensive therapy. 5.  Chronic/heavy alcohol use: Maintained on index of suspicion for withdrawal symptoms.  On CIWA protocol. Subjective:   Tired this afternoon from being in the hospital and would like to rest.   Objective:   BP (!) 161/69 (BP Location: Left Arm)   Pulse 56   Temp 98.5 F (36.9 C) (Oral)   Resp 18   Ht '5\' 9"'  (1.753 m)   Wt 73.1 kg   SpO2 94%   BMI 23.79 kg/m   Intake/Output Summary (Last 24 hours) at 03/15/2020 1338 Last data filed at 03/15/2020 3875 Gross per 24 hour  Intake 1246 ml  Output 2775 ml   Net -1529 ml   Weight change: 0.499 kg  Physical Exam: Gen: Comfortable resting in bed, somnolent CVS: Pulse irregularly irregular, normal rate Resp: Fine rales right base, no distinct rhonchi or wheeze Abd: Soft, flat, nontender Ext: Trace left ankle edema, no edema right ankle  Imaging: DG Chest 1 View  Result Date: 03/14/2020 CLINICAL DATA:  Shortness of breath for a few hours.  Out of Lasix. EXAM: CHEST  1 VIEW COMPARISON:  11/25/2019 FINDINGS: Normal heart size. Interstitial opacity with Kerley lines and small pleural effusions. No pneumothorax. IMPRESSION: CHF. Electronically Signed   By: Monte Fantasia M.D.   On: 03/14/2020 05:09   ECHOCARDIOGRAM COMPLETE  Result Date: 03/14/2020    ECHOCARDIOGRAM REPORT   Patient Name:   Douglas Ballard Date of Exam: 03/14/2020 Medical Rec #:  [id number]      Height:       69.0 in Accession #:    [id number]     Weight:       160.0 lb Date of Birth:  01/05/1951     BSA:          1.879 m Patient Age:    32 years       BP:           198/83 mmHg Patient Gender: M              HR:           67 bpm. Exam Location:  Inpatient Procedure: 2D Echo, Cardiac Doppler and Color Doppler Indications:  R06.02 SOB; J09.32 Acute diastolic (congestive) heart failure  History:        Patient has prior history of Echocardiogram examinations, most                 recent 08/31/2018. CHF, Abnormal ECG, Arrythmias:Atrial                 Fibrillation, Signs/Symptoms:Dyspnea; Risk Factors:Current                 Smoker.  Sonographer:    Roseanna Rainbow RDCS Referring Phys: [id number] Harold Hedge  Sonographer Comments: Technically difficult study due to poor echo windows. Patient in high Fowler's position due to dyspnea. IMPRESSIONS  1. Left ventricular ejection fraction, by estimation, is 55 to 60%. The left ventricle has normal function. The left ventricle has no regional wall motion abnormalities. There is moderate left ventricular hypertrophy. Left ventricular diastolic parameters are  consistent with Grade III diastolic dysfunction (restrictive). Elevated left atrial pressure.  2. Right ventricular systolic function is normal. The right ventricular size is mildly enlarged. Tricuspid regurgitation signal is inadequate for assessing PA pressure.  3. Left atrial size was moderately dilated.  4. Right atrial size was mild to moderately dilated.  5. The mitral valve is grossly normal. Mild mitral valve regurgitation. No evidence of mitral stenosis.  6. The aortic valve is tricuspid. Aortic valve regurgitation is trivial. No aortic stenosis is present. Comparison(s): No significant change from prior study. FINDINGS  Left Ventricle: Left ventricular ejection fraction, by estimation, is 55 to 60%. The left ventricle has normal function. The left ventricle has no regional wall motion abnormalities. The left ventricular internal cavity size was normal in size. There is  moderate left ventricular hypertrophy. Left ventricular diastolic parameters are consistent with Grade III diastolic dysfunction (restrictive). Elevated left atrial pressure. Right Ventricle: The right ventricular size is mildly enlarged. No increase in right ventricular wall thickness. Right ventricular systolic function is normal. Tricuspid regurgitation signal is inadequate for assessing PA pressure. Left Atrium: Left atrial size was moderately dilated. Right Atrium: Right atrial size was mild to moderately dilated. Pericardium: Trivial pericardial effusion is present. Presence of pericardial fat pad. Mitral Valve: The mitral valve is grossly normal. There is mild thickening of the anterior and posterior mitral valve leaflet(s). Mild mitral valve regurgitation. No evidence of mitral valve stenosis. Tricuspid Valve: The tricuspid valve is grossly normal. Tricuspid valve regurgitation is mild . No evidence of tricuspid stenosis. Aortic Valve: The aortic valve is tricuspid. Aortic valve regurgitation is trivial. No aortic stenosis is  present. Pulmonic Valve: The pulmonic valve was grossly normal. Pulmonic valve regurgitation is not visualized. No evidence of pulmonic stenosis. Aorta: The aortic root is normal in size and structure. IAS/Shunts: The atrial septum is grossly normal. Additional Comments: There is a small pleural effusion in both left and right lateral regions.  LEFT VENTRICLE PLAX 2D LVIDd:         5.42 cm      Diastology LVIDs:         3.79 cm      LV e' lateral:   7.43 cm/s LV PW:         1.61 cm      LV E/e' lateral: 17.9 LV IVS:        1.52 cm      LV e' medial:    4.93 cm/s LVOT diam:     2.30 cm      LV E/e' medial:  27.0 LV  SV:         135 LV SV Index:   72 LVOT Area:     4.15 cm  LV Volumes (MOD) LV vol d, MOD A2C: 133.0 ml LV vol d, MOD A4C: 128.0 ml LV vol s, MOD A2C: 60.1 ml LV vol s, MOD A4C: 50.1 ml LV SV MOD A2C:     72.9 ml LV SV MOD A4C:     128.0 ml LV SV MOD BP:      74.2 ml RIGHT VENTRICLE             IVC RV S prime:     12.90 cm/s  IVC diam: 2.50 cm TAPSE (M-mode): 2.3 cm LEFT ATRIUM             Index       RIGHT ATRIUM           Index LA diam:        5.30 cm 2.82 cm/m  RA Area:     23.80 cm LA Vol (A2C):   60.0 ml 31.93 ml/m RA Volume:   85.90 ml  45.71 ml/m LA Vol (A4C):   83.9 ml 44.65 ml/m LA Biplane Vol: 72.1 ml 38.37 ml/m  AORTIC VALVE LVOT Vmax:   140.00 cm/s LVOT Vmean:  87.500 cm/s LVOT VTI:    0.326 m  AORTA Ao Root diam: 3.30 cm MITRAL VALVE MV Area (PHT): 5.20 cm     SHUNTS MV Decel Time: 146 msec     Systemic VTI:  0.33 m MV E velocity: 133.00 cm/s  Systemic Diam: 2.30 cm MV A velocity: 66.50 cm/s MV E/A ratio:  2.00 Eleonore Chiquito MD Electronically signed by Eleonore Chiquito MD Signature Date/Time: 03/14/2020/4:32:36 PM    Final     Labs: BMET Recent Labs  Lab 03/14/20 0349 03/15/20 0522  NA 136 132*  K 4.7 4.2  CL 106 104  CO2 21* 18*  GLUCOSE 172* 219*  BUN 42* 46*  CREATININE 2.82* 2.90*  CALCIUM 8.5* 8.6*  PHOS  --  5.0*   CBC Recent Labs  Lab 03/14/20 0349  03/15/20 0522  WBC 7.0 9.4  HGB 7.8* 7.6*  HCT 24.1* 22.7*  MCV 100.4* 95.4  PLT 240 211   Medications:    . cloNIDine  0.1 mg Oral Daily  . diltiazem  240 mg Oral Daily  . folic acid  1 mg Oral Daily  . furosemide  40 mg Intravenous BID  . insulin aspart  0-9 Units Subcutaneous TID WC  . insulin glargine  5 Units Subcutaneous Daily  . isosorbide-hydrALAZINE  2 tablet Oral BID  . labetalol  200 mg Oral Daily  . multivitamin with minerals  1 tablet Oral Daily  . peg 3350 powder  0.5 kit Oral Once   And  . [START ON 03/16/2020] peg 3350 powder  0.5 kit Oral Once  . sodium chloride flush  3 mL Intravenous Q12H  . thiamine  100 mg Oral Daily   Or  . thiamine  100 mg Intravenous Daily   Elmarie Shiley, MD 03/15/2020, 1:38 PM

## 2020-03-15 NOTE — Progress Notes (Addendum)
Progress Note  CC:  anemia        ASSESSMENT AND PLAN:    Douglas Ballard is a 69 y.o. male PMH significant for, but not necessarily limited to,   Diverticulosis , hx of colovesical fistula s/p colon resection, colon polyps, AFIB,  TIA, prostate cancer  S/p prostatectomy, HTN , CKD, anemia on Epo injections.                                                                                                                            # Progressive macrocytic anemia / FOBT positive on Xarelto --Gets Epo injections. Hgb 9.6 one year ago, now at 7.8.  --Doesn't appear iron deficient with ferritin of 46, normal % sat, low TIBC --No overt GI bleeding or focal GI symptoms. Will need EGD and colonoscopy, probably this admission. Would like to give a day or two to optimize breathing as he complains of orthopnea / admitted with CHF.  --Received  1 u PRBC yesterday.  Follow up Hgb collected but not resulted.  --Plan is for EGD / colonoscopy tomorrow.  The risks and benefits of EGD and colonoscopy with possible polypectomy / biopsies were discussed and the patient agrees to proceed.  --I plan to reassess him later today, breathing still seems a little labored today. If need be we can push endoscopic evaluation off another day.   # CHF --elevated BNP, CHF on CXR.  --Echo yesterday > grade III DD, normal EF --Diuresing on lasix. Urine output 2475 ml yesterday.  --He feels breathing has improved today.   # AFIB on Xarelto --Last dose was Saturday.   # Etoh use --on CIWA. Doesn't appear to have any withdrawal symptoms.   # Hx of adenomatous colon polyps in 2015 --For ~ 7 year recall based on new polyp surveillance guidelines.   # CKD. Nephrology following  # Voluntary weight loss over last 6 months       SUBJECTIVE   Breathing a little better compared to yesterday. No GI complaints.     OBJECTIVE:     Vital signs in last 24 hours: Temp:  [97.7 F (36.5 C)-98.5 F (36.9 C)] 98.2  F (36.8 C) (07/27 0513) Pulse Rate:  [58-78] 63 (07/27 0513) Resp:  [16-31] 20 (07/27 0513) BP: (148-211)/(61-89) 166/63 (07/27 0513) SpO2:  [93 %-98 %] 96 % (07/27 0513) Weight:  [73.1 kg] 73.1 kg (07/26 1834) Last BM Date: 03/14/20 General:   Alert, in NAD Heart:  Regular rate.  No lower extremity edema   Pulm: Normal respiratory effort.    Abdomen:  Soft,  nontender, nondistended.  Normal bowel sounds.          Neurologic:  Alert and  oriented,  grossly normal neurologically. Psych:  Pleasant, cooperative.  Normal mood and affect.   Intake/Output from previous day: 07/26 0701 - 07/27 0700 In: 1022.2 [P.O.:600; I.V.:16.2; Blood:406] Out: 2475 [Urine:2475] Intake/Output this shift: No intake/output data recorded.  Lab Results: Recent Labs    03/14/20 0349 03/15/20 0522  WBC 7.0 9.4  HGB 7.8* 7.6*  HCT 24.1* 22.7*  PLT 240 211   BMET Recent Labs    03/14/20 0349 03/15/20 0522  NA 136 132*  K 4.7 4.2  CL 106 104  CO2 21* 18*  GLUCOSE 172* 219*  BUN 42* 46*  CREATININE 2.82* 2.90*  CALCIUM 8.5* 8.6*   LFT Recent Labs    03/15/20 0522  ALBUMIN 3.2*   PT/INR No results for input(s): LABPROT, INR in the last 72 hours. Hepatitis Panel No results for input(s): HEPBSAG, HCVAB, HEPAIGM, HEPBIGM in the last 72 hours.  DG Chest 1 View  Result Date: 03/14/2020 CLINICAL DATA:  Shortness of breath for a few hours.  Out of Lasix. EXAM: CHEST  1 VIEW COMPARISON:  11/25/2019 FINDINGS: Normal heart size. Interstitial opacity with Kerley lines and small pleural effusions. No pneumothorax. IMPRESSION: CHF. Electronically Signed   By: Monte Fantasia M.D.   On: 03/14/2020 05:09   ECHOCARDIOGRAM COMPLETE  Result Date: 03/14/2020    ECHOCARDIOGRAM REPORT   Patient Name:   Douglas Ballard Date of Exam: 03/14/2020 Medical Rec #:  [id number]      Height:       69.0 in Accession #:    [id number]     Weight:       160.0 lb Date of Birth:  [birth year]     BSA:          1.879 m  Patient Age:    76 years       BP:           198/83 mmHg Patient Gender: M              HR:           67 bpm. Exam Location:  Inpatient Procedure: 2D Echo, Cardiac Doppler and Color Doppler Indications:    R06.02 SOB; C94.70 Acute diastolic (congestive) heart failure  History:        Patient has prior history of Echocardiogram examinations, most                 recent 08/31/2018. CHF, Abnormal ECG, Arrythmias:Atrial                 Fibrillation, Signs/Symptoms:Dyspnea; Risk Factors:Current                 Smoker.  Sonographer:    Roseanna Rainbow RDCS Referring Phys: [id number] Harold Hedge  Sonographer Comments: Technically difficult study due to poor echo windows. Patient in high Fowler's position due to dyspnea. IMPRESSIONS  1. Left ventricular ejection fraction, by estimation, is 55 to 60%. The left ventricle has normal function. The left ventricle has no regional wall motion abnormalities. There is moderate left ventricular hypertrophy. Left ventricular diastolic parameters are consistent with Grade III diastolic dysfunction (restrictive). Elevated left atrial pressure.  2. Right ventricular systolic function is normal. The right ventricular size is mildly enlarged. Tricuspid regurgitation signal is inadequate for assessing PA pressure.  3. Left atrial size was moderately dilated.  4. Right atrial size was mild to moderately dilated.  5. The mitral valve is grossly normal. Mild mitral valve regurgitation. No evidence of mitral stenosis.  6. The aortic valve is tricuspid. Aortic valve regurgitation is trivial. No aortic stenosis is present. Comparison(s): No significant change from prior study. FINDINGS  Left Ventricle: Left ventricular ejection fraction, by estimation, is 55 to 60%. The left ventricle has normal  function. The left ventricle has no regional wall motion abnormalities. The left ventricular internal cavity size was normal in size. There is  moderate left ventricular hypertrophy. Left ventricular diastolic  parameters are consistent with Grade III diastolic dysfunction (restrictive). Elevated left atrial pressure. Right Ventricle: The right ventricular size is mildly enlarged. No increase in right ventricular wall thickness. Right ventricular systolic function is normal. Tricuspid regurgitation signal is inadequate for assessing PA pressure. Left Atrium: Left atrial size was moderately dilated. Right Atrium: Right atrial size was mild to moderately dilated. Pericardium: Trivial pericardial effusion is present. Presence of pericardial fat pad. Mitral Valve: The mitral valve is grossly normal. There is mild thickening of the anterior and posterior mitral valve leaflet(s). Mild mitral valve regurgitation. No evidence of mitral valve stenosis. Tricuspid Valve: The tricuspid valve is grossly normal. Tricuspid valve regurgitation is mild . No evidence of tricuspid stenosis. Aortic Valve: The aortic valve is tricuspid. Aortic valve regurgitation is trivial. No aortic stenosis is present. Pulmonic Valve: The pulmonic valve was grossly normal. Pulmonic valve regurgitation is not visualized. No evidence of pulmonic stenosis. Aorta: The aortic root is normal in size and structure. IAS/Shunts: The atrial septum is grossly normal. Additional Comments: There is a small pleural effusion in both left and right lateral regions.  LEFT VENTRICLE PLAX 2D LVIDd:         5.42 cm      Diastology LVIDs:         3.79 cm      LV e' lateral:   7.43 cm/s LV PW:         1.61 cm      LV E/e' lateral: 17.9 LV IVS:        1.52 cm      LV e' medial:    4.93 cm/s LVOT diam:     2.30 cm      LV E/e' medial:  27.0 LV SV:         135 LV SV Index:   72 LVOT Area:     4.15 cm  LV Volumes (MOD) LV vol d, MOD A2C: 133.0 ml LV vol d, MOD A4C: 128.0 ml LV vol s, MOD A2C: 60.1 ml LV vol s, MOD A4C: 50.1 ml LV SV MOD A2C:     72.9 ml LV SV MOD A4C:     128.0 ml LV SV MOD BP:      74.2 ml RIGHT VENTRICLE             IVC RV S prime:     12.90 cm/s  IVC diam: 2.50  cm TAPSE (M-mode): 2.3 cm LEFT ATRIUM             Index       RIGHT ATRIUM           Index LA diam:        5.30 cm 2.82 cm/m  RA Area:     23.80 cm LA Vol (A2C):   60.0 ml 31.93 ml/m RA Volume:   85.90 ml  45.71 ml/m LA Vol (A4C):   83.9 ml 44.65 ml/m LA Biplane Vol: 72.1 ml 38.37 ml/m  AORTIC VALVE LVOT Vmax:   140.00 cm/s LVOT Vmean:  87.500 cm/s LVOT VTI:    0.326 m  AORTA Ao Root diam: 3.30 cm MITRAL VALVE MV Area (PHT): 5.20 cm     SHUNTS MV Decel Time: 146 msec     Systemic VTI:  0.33 m MV E velocity: 133.00 cm/s  Systemic Diam: 2.30 cm MV A velocity: 66.50 cm/s MV E/A ratio:  2.00 Eleonore Chiquito MD Electronically signed by Eleonore Chiquito MD Signature Date/Time: 03/14/2020/4:32:36 PM    Final      Principal Problem:   Acute CHF (congestive heart failure) ([place]) Active Problems:   GI bleed   Atrial fibrillation, chronic (HCC)   CKD stage G4/A1, 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

## 2020-03-16 ENCOUNTER — Inpatient Hospital Stay (HOSPITAL_COMMUNITY): Payer: Medicare HMO | Admitting: Anesthesiology

## 2020-03-16 ENCOUNTER — Encounter (HOSPITAL_COMMUNITY): Payer: Self-pay | Admitting: Internal Medicine

## 2020-03-16 ENCOUNTER — Encounter (HOSPITAL_COMMUNITY)
Admission: EM | Disposition: A | Discharge status: Home / Self Care | Payer: Self-pay | Source: Home / Self Care | Attending: Internal Medicine

## 2020-03-16 DIAGNOSIS — I482 Chronic atrial fibrillation, unspecified: Secondary | ICD-10-CM | POA: Diagnosis not present

## 2020-03-16 DIAGNOSIS — N184 Chronic kidney disease, stage 4 (severe): Secondary | ICD-10-CM | POA: Diagnosis not present

## 2020-03-16 DIAGNOSIS — R195 Other fecal abnormalities: Secondary | ICD-10-CM

## 2020-03-16 DIAGNOSIS — I509 Heart failure, unspecified: Secondary | ICD-10-CM | POA: Diagnosis not present

## 2020-03-16 DIAGNOSIS — K635 Polyp of colon: Secondary | ICD-10-CM

## 2020-03-16 DIAGNOSIS — K2970 Gastritis, unspecified, without bleeding: Secondary | ICD-10-CM

## 2020-03-16 DIAGNOSIS — K5730 Diverticulosis of large intestine without perforation or abscess without bleeding: Secondary | ICD-10-CM

## 2020-03-16 DIAGNOSIS — K641 Second degree hemorrhoids: Secondary | ICD-10-CM

## 2020-03-16 HISTORY — PX: COLONOSCOPY WITH PROPOFOL: SHX5780

## 2020-03-16 HISTORY — PX: ESOPHAGOGASTRODUODENOSCOPY (EGD) WITH PROPOFOL: SHX5813

## 2020-03-16 HISTORY — PX: BIOPSY: SHX5522

## 2020-03-16 HISTORY — PX: POLYPECTOMY: SHX5525

## 2020-03-16 LAB — BASIC METABOLIC PANEL
Anion gap: 13 (ref 5–15)
BUN: 47 mg/dL — ABNORMAL HIGH (ref 8–23)
CO2: 19 mmol/L — ABNORMAL LOW (ref 22–32)
Calcium: 8.9 mg/dL (ref 8.9–10.3)
Chloride: 108 mmol/L (ref 98–111)
Creatinine, Ser: 2.76 mg/dL — ABNORMAL HIGH (ref 0.61–1.24)
GFR calc Af Amer: 26 mL/min — ABNORMAL LOW (ref >60)
GFR calc non Af Amer: 23 mL/min — ABNORMAL LOW (ref >60)
Glucose, Bld: 122 mg/dL — ABNORMAL HIGH (ref 70–99)
Potassium: 4 mmol/L (ref 3.5–5.1)
Sodium: 140 mmol/L (ref 135–145)

## 2020-03-16 LAB — CBC
HCT: 28.1 % — ABNORMAL LOW (ref 39.0–52.0)
Hemoglobin: 9.4 g/dL — ABNORMAL LOW (ref 13.0–17.0)
MCH: 32.1 pg (ref 26.0–34.0)
MCHC: 33.5 g/dL (ref 30.0–36.0)
MCV: 95.9 fL (ref 80.0–100.0)
Platelets: 258 K/uL (ref 150–400)
RBC: 2.93 MIL/uL — ABNORMAL LOW (ref 4.22–5.81)
RDW: 14.3 % (ref 11.5–15.5)
WBC: 7.5 K/uL (ref 4.0–10.5)
nRBC: 0 % (ref 0.0–0.2)

## 2020-03-16 LAB — GLUCOSE, CAPILLARY
Glucose-Capillary: 121 mg/dL — ABNORMAL HIGH (ref 70–99)
Glucose-Capillary: 147 mg/dL — ABNORMAL HIGH (ref 70–99)
Glucose-Capillary: 331 mg/dL — ABNORMAL HIGH (ref 70–99)
Glucose-Capillary: 166 mg/dL — ABNORMAL HIGH (ref 70–99)

## 2020-03-16 SURGERY — COLONOSCOPY WITH PROPOFOL
Anesthesia: Monitor Anesthesia Care

## 2020-03-16 MED ORDER — PROPOFOL 500 MG/50ML IV EMUL
INTRAVENOUS | Status: DC | PRN
  Administered 2020-03-16: 75 ug/kg/min via INTRAVENOUS
  Administered 2020-03-16: 70 mg via INTRAVENOUS

## 2020-03-16 MED ORDER — LACTATED RINGERS IV SOLN: INTRAVENOUS | Status: DC | PRN

## 2020-03-16 MED ORDER — DARBEPOETIN ALFA 60 MCG/0.3ML IJ SOSY
60.0000 ug | PREFILLED_SYRINGE | INTRAMUSCULAR | Status: DC
  Administered 2020-03-16: 60 ug via SUBCUTANEOUS
  Filled 2020-03-16 (×3): qty 0.3

## 2020-03-16 MED ORDER — HYDROXYZINE HCL 25 MG PO TABS
25.0000 mg | ORAL_TABLET | Freq: Every evening | ORAL | Status: AC | PRN
  Administered 2020-03-17: 25 mg via ORAL
  Filled 2020-03-16: qty 1

## 2020-03-16 MED ORDER — FUROSEMIDE 40 MG PO TABS
40.0000 mg | ORAL_TABLET | Freq: Two times a day (BID) | ORAL | Status: DC
  Administered 2020-03-17: 40 mg via ORAL
  Filled 2020-03-16: qty 1

## 2020-03-16 SURGICAL SUPPLY — 25 items

## 2020-03-16 NOTE — Progress Notes (Signed)
PROGRESS NOTE    Douglas Ballard  NUU:[id number] DOB: Jan 03, 1951 DOA: 03/14/2020 PCP: Vivi Barrack, MD    Chief Complaint  Patient presents with  . Shortness of Breath    Brief Narrative:  69 year old male prior history of CKD stage IV, diabetes mellitus, atrial fibrillation on Xarelto, chronic diastolic heart failure, colovesicular fistula status post colectomy, alcohol abuse, tobacco abuse, anemia of chronic disease , presents to ED with shortness of breath probably secondary to fluid overload.  Patient was admitted for evaluation and management of acute on chronic diastolic heart failure.  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  Last echocardiogram shows preserved left ventricular ejection fraction of 55%.  He was initially started on IV Lasix 40 mg twice daily with appropriate diuresis. Transitioned to oral lasix today as he appears to be compensated. Diuresed 2.6 lit since admission.     Chronic atrial fibrillation Rate controlled and in normal sinus rhythm at this time. On Xarelto for anticoagulation which has been held for heme positive stools. CHA2DS2-VASc score is 4. Echocardiogram showed preserved left ventricular ejection fraction.   Stage IV CKD Creatinine appears to be at baseline at this time. Appreciate nephrology recommendations. Plan for outpatient follow-up.  Anemia of chronic disease Heme  positive stools. Acute anemia of blood loss probably GI source.  Superimposed on anemia of chronic disease from stage IV CKD Patient underwent EGD and colonoscopy today. Further recommendations to follow from GI.   Type 2 diabetes A1c is 5.9 Continue sliding scale at this time    Hypertensive crisis Blood pressure parameters are controlled at this time.  Continue with clonidine and BiDil       DVT prophylaxis: Xarelto on hold. Code Status: Full code Family Communication: None at bedside Disposition:   Status is: Inpatient  Remains inpatient appropriate because:Ongoing diagnostic testing needed not appropriate for outpatient work up   Dispo: The patient is from: Home              Anticipated d/c is to: Home              Anticipated d/c date is: 1 day              Patient currently is not medically stable to d/c.       Consultants:   Gastroenterology  Nephrology.   Procedures: EGD  Colonoscopy   Antimicrobials: None   Subjective: No new complaints, wants to go home.  Objective: Vitals:   03/16/20 1257 03/16/20 1300 03/16/20 1310 03/16/20 1329  BP: (!) 134/78 (!) 137/75 (!) 156/69 (!) 151/93  Pulse: 92 89 87 90  Resp: 18 19 21 20   Temp:    (!) 97.4 F (36.3 C)  TempSrc:    Oral  SpO2: 100% 100% 97% 97%  Weight:      Height:        Intake/Output Summary (Last 24 hours) at 03/16/2020  [place] filed at 03/16/2020 1300 Gross per 24 hour  Intake 500 ml  Output 1175 ml  Net -675 ml   Filed Weights   03/14/20 0341 03/14/20 1834 03/16/20 1056  Weight: 72.6 kg 73.1 kg 72.6 kg    Examination:  General exam: Appears calm and comfortable  Respiratory system: Clear to auscultation. Respiratory effort normal. Cardiovascular system: S1 & S2 heard, RRR. No JVD,No pedal edema. Gastrointestinal system: Abdomen is nondistended, soft and nontender. No organomegaly or masses felt. Normal bowel sounds heard. Central nervous system: Alert and  oriented. No focal neurological deficits. Extremities: Symmetric 5 x 5 power. Skin: No rashes, lesions or ulcers Psychiatry:  Mood & affect appropriate.     Data Reviewed: I have personally reviewed following labs and imaging studies  CBC: Recent Labs  Lab 03/14/20 0349 03/15/20 0522 03/16/20 0528  WBC 7.0 9.4 7.5  HGB 7.8* 7.6* 9.4*  HCT 24.1* 22.7* 28.1*  MCV 100.4* 95.4 95.9  PLT 240 211 161    Basic Metabolic Panel: Recent Labs  Lab 03/14/20 0349 03/15/20 0522 03/16/20 0528  NA 136 132* 140  K 4.7 4.2 4.0  CL 106 104 108  CO2 21* 18* 19*  GLUCOSE 172* 219* 122*  BUN 42* 46* 47*  CREATININE 2.82* 2.90* 2.76*  CALCIUM 8.5* 8.6* 8.9  MG  --  1.8  --   PHOS  --  5.0*  --     GFR: Estimated Creatinine Clearance: 25.6 mL/min (A) (by C-G formula based on SCr of 2.76 mg/dL (H)).  Liver Function Tests: Recent Labs  Lab 03/15/20 0522  ALBUMIN 3.2*    CBG: Recent Labs  Lab 03/15/20 1138 03/15/20 1656 03/15/20 2131 03/16/20 0731 03/16/20 1327  GLUCAP 179* 208* 77 121* 147*     Recent Results (from the past 240 hour(s))  SARS Coronavirus 2 by RT PCR (hospital order, performed in [facility] hospital lab) Nasopharyngeal Nasopharyngeal Swab     Status: None   Collection Time: 03/14/20  5:03 AM   Specimen: Nasopharyngeal Swab  Result Value Ref Range Status   SARS Coronavirus 2 NEGATIVE NEGATIVE Final    Comment: (NOTE) SARS-CoV-2 target nucleic acids are NOT DETECTED.  The SARS-CoV-2 RNA is generally detectable in upper and lower respiratory specimens during the acute phase of infection. The lowest concentration of SARS-CoV-2 viral copies this assay can detect is 250 copies / mL. A negative result does not preclude SARS-CoV-2 infection and should not be used as the sole basis for treatment or other patient management decisions.  A negative result may occur with improper specimen collection / handling, submission of specimen other than nasopharyngeal  swab, presence of viral mutation(s) within the areas targeted by this assay, and inadequate number of viral copies (<250 copies / mL). A negative result must be combined with clinical observations, patient history, and epidemiological information.  Fact Sheet for Patients:   StrictlyIdeas.no  Fact Sheet for Healthcare Providers: BankingDealers.co.za  This test is not yet approved or  cleared by the [place] FDA and has been authorized for detection and/or diagnosis of SARS-CoV-2 by FDA under an Emergency Use Authorization (EUA).  This EUA will remain in effect (meaning this test can be used) for the duration of the COVID-19 declaration under Section 564(b)(1) of the Act, 21 U.S.C. section 360bbb-3(b)(1), unless the authorization is terminated or revoked sooner.  Performed at [facility], [place] [address], [place], [place] [address]  Radiology Studies: No results found.      Scheduled Meds: . cloNIDine  0.1 mg Oral Daily  . darbepoetin (ARANESP) injection - NON-DIALYSIS  60 mcg Subcutaneous Q Wed-1800  . diltiazem  240 mg Oral Daily  . folic acid  1 mg Oral Daily  . furosemide  40 mg Intravenous BID  . insulin aspart  0-9 Units Subcutaneous TID WC  . insulin glargine  5 Units Subcutaneous Daily  . isosorbide-hydrALAZINE  2 tablet Oral BID  . labetalol  200 mg Oral Daily  . multivitamin with minerals  1 tablet Oral Daily  . sodium chloride flush  3 mL Intravenous Q12H  . thiamine  100 mg Oral Daily   Or  . thiamine  100 mg Intravenous Daily   Continuous Infusions: . sodium chloride       LOS: 2 days        Hosie Poisson, MD [facility]   To contact the attending provider between 7A-7P or the covering provider during after hours 7P-7A, please log into the web site [email or web address] and access using universal [place] password for that web site. If you do not have the  password, please call the hospital operator.  03/16/2020, 3:55 PM

## 2020-03-16 NOTE — Interval H&P Note (Signed)
History and Physical Interval Note:  03/16/2020 11:33 AM  Douglas Ballard  has presented today for surgery, with the diagnosis of anemia, hemoccult positive stool.  The various methods of treatment have been discussed with the patient and family. After consideration of risks, benefits and other options for treatment, the patient has consented to  Procedure(s): COLONOSCOPY WITH PROPOFOL (N/A) ESOPHAGOGASTRODUODENOSCOPY (EGD) WITH PROPOFOL (N/A) as a surgical intervention.  The patient's history has been reviewed, patient examined, no change in status, stable for surgery.  I have reviewed the patient's chart and labs.  Questions were answered to the patient's satisfaction.     [name]

## 2020-03-16 NOTE — Anesthesia Postprocedure Evaluation (Signed)
Anesthesia Post Note  Patient: Douglas Ballard  Procedure(s) Performed: COLONOSCOPY WITH PROPOFOL (N/A ) ESOPHAGOGASTRODUODENOSCOPY (EGD) WITH PROPOFOL (N/A ) BIOPSY POLYPECTOMY     Patient location during evaluation: Endoscopy Anesthesia Type: MAC Level of consciousness: awake and alert Pain management: pain level controlled Vital Signs Assessment: post-procedure vital signs reviewed and stable Respiratory status: spontaneous breathing, nonlabored ventilation, respiratory function stable and patient connected to nasal cannula oxygen Cardiovascular status: stable and blood pressure returned to baseline Postop Assessment: no apparent nausea or vomiting Anesthetic complications: no   No complications documented.  Last Vitals:  Vitals:   03/16/20 1310 03/16/20 1329  BP: (!) 156/69 (!) 151/93  Pulse: 87 90  Resp: 21 20  Temp:  (!) 36.3 C  SpO2: 97% 97%    Last Pain:  Vitals:   03/16/20 1329  TempSrc: Oral  PainSc:                  Carlito Bogert P Jaeshaun Riva

## 2020-03-16 NOTE — Progress Notes (Signed)
Patient ID: Douglas Ballard, male   DOB: Apr 07, 1951, 69 y.o.   MRN: [id number]   Attempted to see the patient but he was away for colonoscopy/EGD.  It appears that he has had good response to diuretics overnight with net negative fluid balance and vital signs indicate good oxygenation.  Labs indicate stable renal function without any acute electrolyte abnormalities.  I recommend conversion to oral diuretics today if respiratory status is back to baseline (patient was taking 40 mg twice daily).  If respiratory status is not back to baseline, continue intravenous furosemide for another 24 hours.  I recommend that he follows up with Dr.Nwobu (nephrology) as scheduled upon discharge.  I have ordered a dose of Aranesp today.  Renal service will sign off and remain available for questions or concerns.  Elmarie Shiley MD [facility]. Office # [phone] 11:42 AM

## 2020-03-16 NOTE — Op Note (Signed)
[facility] Patient Name: Douglas Ballard Procedure Date: 03/16/2020 MRN: [id number] Attending MD: Gerrit Heck , MD Date of Birth: June 06, 1951 CSN: [id number] Age: 69 Admit Type: Inpatient Procedure:                Upper GI endoscopy Indications:              Heme positive stool, Macrocytic anemia                           History of Afib, on Xarelto. Providers:                Gerrit Heck, MD, Wynonia Sours, RN, Theodora Blow,                            Technician, Eliberto Ivory, CRNA Referring MD:              Medicines:                Monitored Anesthesia Care Complications:            No immediate complications. Estimated Blood Loss:     Estimated blood loss was minimal. Procedure:                Pre-Anesthesia Assessment:                           - Prior to the procedure, a History and Physical                            was performed, and patient medications and                            allergies were reviewed. The patient's tolerance of                            previous anesthesia was also reviewed. The risks                            and benefits of the procedure and the sedation                            options and risks were discussed with the patient.                            All questions were answered, and informed consent                            was obtained. Prior Anticoagulants: The patient has                            taken Xarelto (rivaroxaban), last dose was 4 days                            prior to procedure. ASA Grade Assessment: III - A  patient with severe systemic disease. After                            reviewing the risks and benefits, the patient was                            deemed in satisfactory condition to undergo the                            procedure.                           After obtaining informed consent, the endoscope was                            passed under direct vision.  Throughout the                            procedure, the patient's blood pressure, pulse, and                            oxygen saturations were monitored continuously. The                            [id number]-[id number] ([id number]) was introduced through the                            mouth, and advanced to the second part of duodenum.                            The upper GI endoscopy was accomplished without                            difficulty. The patient tolerated the procedure                            well. Scope In: Scope Out: Findings:      The examined esophagus was normal.      Scattered mild inflammation characterized by congestion (edema) and       erythema was found in the gastric fundus, in the gastric body and in the       gastric antrum. Biopsies were taken with a cold forceps for Helicobacter       pylori testing. Estimated blood loss was minimal.      The duodenal bulb, first portion of the duodenum and second portion of       the duodenum were normal. Biopsies for histology were taken with a cold       forceps for evaluation of celiac disease. Estimated blood loss was       minimal. Impression:               - Normal esophagus.                           - Gastritis. Biopsied.                           -  Normal duodenal bulb, first portion of the                            duodenum and second portion of the duodenum.                            Biopsied. Moderate Sedation:      Not Applicable - Patient had care per Anesthesia. Recommendation:           - Use Protonix (pantoprazole) 40 mg PO BID for 6                            weeks to promote mucosal healing, then can titrate                            to 40 mg daily and then to lowest dose to control                            any reflux symptoms.                           - Await pathology results.                           - Perform a colonoscopy today. Additional                            recommendations pending  colonoscopy. Procedure Code(s):        --- Professional ---                           [phone], Esophagogastroduodenoscopy, flexible,                            transoral; with biopsy, single or multiple Diagnosis Code(s):        --- Professional ---                           K29.70, Gastritis, unspecified, without bleeding                           R19.5, Other fecal abnormalities CPT copyright 2019 American Medical Association. All rights reserved. The codes documented in this report are preliminary and upon coder review may  be revised to meet current compliance requirements. Gerrit Heck, MD 03/16/2020 1:05:27 PM Number of Addenda: 0

## 2020-03-16 NOTE — Op Note (Signed)
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 Providers:                Douglas Heck, MD, Douglas Sours, RN, Douglas Ballard,                            Technician, Douglas Ivory, CRNA Referring MD:              Medicines:                Monitored Anesthesia Care Complications:            No immediate complications. Estimated Blood Loss:     Estimated blood loss was minimal. Procedure:                Pre-Anesthesia Assessment:                           - Prior to the procedure, a History and Physical                            was performed, and patient medications and                            allergies were reviewed. The patient's tolerance of                            previous anesthesia was also reviewed. The risks                            and benefits of the procedure and the sedation                            options and risks were discussed with the patient.                            All questions were answered, and informed consent                            was obtained. Prior Anticoagulants: The patient has                            taken  Xarelto (rivaroxaban), last dose was 4 days                            prior to procedure. ASA Grade Assessment: III - A                            patient with severe systemic disease. After                            reviewing the risks and benefits, the patient was                            deemed in satisfactory condition to undergo the                            procedure.                            After obtaining informed consent, the colonoscope                            was passed under direct vision. Throughout the                            procedure, the patient's blood pressure, pulse, and                            oxygen saturations were monitored continuously. The                            CF-HQ190L ([id number] Olympus colonoscope was                            introduced through the anus and advanced to the the                            terminal ileum. The colonoscopy was performed                            without difficulty. The patient tolerated the                            procedure well. The quality of the bowel                            preparation was adequate. The terminal ileum,                            ileocecal valve, appendiceal orifice, and rectum                            were photographed. Scope In: 12:36:13 PM Scope Out: 12:51:47 PM Scope Withdrawal Time: 0 hours 12 minutes 19 seconds  Total Procedure Duration: 0 hours 15 minutes 34 seconds  Findings:  Hemorrhoids were found on perianal exam.      Four sessile polyps were found in the transverse colon (2) and ascending       colon (2). The polyps were 3 to 6 mm in size. These polyps were removed       with a cold snare. Resection and retrieval were complete. Estimated       blood loss was minimal.      Multiple small and large-mouthed diverticula were found in the sigmoid       colon, hepatic flexure, ascending colon and cecum. The remainder of the       colon was normal appearing. no areas of mucosal erythema, edema, ulcers,       or erosions. No blood in the colonic/small bowel lumen.      Non-bleeding internal hemorrhoids were found during retroflexion. The       hemorrhoids were medium-sized and Grade II (internal hemorrhoids that       prolapse but reduce spontaneously).      The terminal ileum contained a single large diverticulum. The base was       clean and  no evidence of recent bleeding.      The remainder of the terminal ileum appeared normal up to 10 cm from the       ICV. Impression:               - Hemorrhoids found on perianal exam.                           - Four 3 to 6 mm polyps in the transverse colon and                            in the ascending colon, removed with a cold snare.                            Resected and retrieved.                           - Diverticulosis in the sigmoid colon, at the                            hepatic flexure, in the ascending colon and in the                            cecum.                           - Non-bleeding internal hemorrhoids.                           - Ileal diverticulum.                           - The examined portion of the ileum was normal. Moderate Sedation:      Not Applicable - Patient had care per Anesthesia. Recommendation:           - Return patient to hospital ward for ongoing care.                           -  Resume previous diet today.                           - Continue present medications.                           - Resume Xarelto (rivaroxaban) at prior dose in 2                            days.                           - Await pathology results.                           - Repeat colonoscopy in 3 years for surveillance.                           - Return to GI clinic at appointment to be                            scheduled. Depending on the pathology from the EGD                            and repeat blood counts, will discuss whether or                            not further evaluation is needed (ie, small bowel                            interrogation or additional cross sectional                            imaging).                           - Repeat CBC 7-10 days after hospital discharge.                           - Please do not hesitate to contact the GI service                            with questions or concerns.                           - Results  discussed with patient in person and with                            wife by phone Douglas Ballard). Procedure Code(s):        --- Professional ---                           [phone], Colonoscopy, flexible; with removal of  tumor(s), polyp(s), or other lesion(s) by snare                            technique Diagnosis Code(s):        --- Professional ---                           K64.1, Second degree hemorrhoids                           K63.5, Polyp of colon                           R19.5, Other fecal abnormalities                           K57.50, Diverticulosis of both small and large                            intestine without perforation or abscess without                            bleeding CPT copyright 2019 [employer]. All rights reserved. The codes documented in this report are preliminary and upon coder review may  be revised to meet current compliance requirements. Douglas Heck, MD 03/16/2020 1:14:55 PM Number of Addenda: 0

## 2020-03-16 NOTE — Transfer of Care (Signed)
Immediate Anesthesia Transfer of Care Note  Patient: Douglas Ballard  Procedure(s) Performed: Procedure(s): COLONOSCOPY WITH PROPOFOL (N/A) ESOPHAGOGASTRODUODENOSCOPY (EGD) WITH PROPOFOL (N/A) BIOPSY POLYPECTOMY  Patient Location: PACU and Endoscopy Unit  Anesthesia Type:mac   Level of Consciousness: awake, alert  and oriented  Airway & Oxygen Therapy: Patient Spontanous Breathing and Patient connected to nasal cannula oxygen  Post-op Assessment: Report given to RN and Post -op Vital signs reviewed and stable  Post vital signs: Reviewed and stable  Last Vitals:  Vitals:   03/16/20 0957 03/16/20 1056  BP: (!) 181/96 (!) 152/95  Pulse:  96  Resp:  20  Temp:  36.8 C  SpO2:  11%    Complications: No apparent anesthesia complications

## 2020-03-16 NOTE — Anesthesia Preprocedure Evaluation (Addendum)
Anesthesia Evaluation  Patient identified by MRN, date of birth, ID band Patient awake    Reviewed: Allergy & Precautions, NPO status , Patient's Chart, lab work & pertinent test results  Airway Mallampati: III  TM Distance: >3 FB Neck ROM: Full    Dental  (+) Poor Dentition   Pulmonary former smoker,    Pulmonary exam normal breath sounds clear to auscultation       Cardiovascular hypertension, Pt. on medications and Pt. on home beta blockers +CHF  Normal cardiovascular exam+ dysrhythmias Atrial Fibrillation  Rhythm:Regular Rate:Normal  ECG: NSR, rate 63  ECHO: 1. Left ventricular ejection fraction, by estimation, is 55 to 60%. The left ventricle has normal function. The left ventricle has no regional wall motion abnormalities. There is moderate left ventricular hypertrophy. Left ventricular diastolic parameters are consistent with Grade III diastolic dysfunction (restrictive). Elevated left atrial pressure. 2. Right ventricular systolic function is normal. The right ventricular size is mildly enlarged. Tricuspid regurgitation signal is inadequate for assessing PA pressure. 3. Left atrial size was moderately dilated. 4. Right atrial size was mild to moderately dilated. 5. The mitral valve is grossly normal. Mild mitral valve regurgitation. No evidence of mitral stenosis. 6. The aortic valve is tricuspid. Aortic valve regurgitation is trivial. No aortic stenosis is present. Comparison(s): No significant change from prior study.   Neuro/Psych Seizures -, Well Controlled,  Anxiety Alcohol withdrawal seizure TIACVA    GI/Hepatic Neg liver ROS, Bowel prep,  Endo/Other  diabetes  Renal/GU Renal disease     Musculoskeletal negative musculoskeletal ROS (+)   Abdominal   Peds  Hematology  (+) anemia ,   Anesthesia Other Findings anemia  hemoccult positive stool  Reproductive/Obstetrics                             Anesthesia Physical Anesthesia Plan  ASA: III  Anesthesia Plan: MAC   Post-op Pain Management:    Induction: Intravenous  PONV Risk Score and Plan: 1 and Propofol infusion and Treatment may vary due to age or medical condition  Airway Management Planned: Nasal Cannula  Additional Equipment:   Intra-op Plan:   Post-operative Plan:   Informed Consent: I have reviewed the patients History and Physical, chart, labs and discussed the procedure including the risks, benefits and alternatives for the proposed anesthesia with the patient or authorized representative who has indicated his/her understanding and acceptance.     Dental advisory given  Plan Discussed with: CRNA  Anesthesia Plan Comments:         Anesthesia Quick Evaluation

## 2020-03-17 ENCOUNTER — Other Ambulatory Visit: Payer: Self-pay

## 2020-03-17 DIAGNOSIS — N184 Chronic kidney disease, stage 4 (severe): Secondary | ICD-10-CM | POA: Diagnosis not present

## 2020-03-17 DIAGNOSIS — I509 Heart failure, unspecified: Secondary | ICD-10-CM | POA: Diagnosis not present

## 2020-03-17 DIAGNOSIS — I482 Chronic atrial fibrillation, unspecified: Secondary | ICD-10-CM | POA: Diagnosis not present

## 2020-03-17 LAB — GLUCOSE, CAPILLARY
Glucose-Capillary: 119 mg/dL — ABNORMAL HIGH (ref 70–99)
Glucose-Capillary: 249 mg/dL — ABNORMAL HIGH (ref 70–99)

## 2020-03-17 LAB — SURGICAL PATHOLOGY

## 2020-03-17 MED ORDER — PANTOPRAZOLE SODIUM 40 MG PO TBEC
40.0000 mg | DELAYED_RELEASE_TABLET | Freq: Two times a day (BID) | ORAL | Status: DC
  Administered 2020-03-17: 40 mg via ORAL
  Filled 2020-03-17: qty 1

## 2020-03-17 MED ORDER — THIAMINE HCL 100 MG PO TABS: 100.0000 mg | ORAL_TABLET | Freq: Every day | ORAL | Status: AC

## 2020-03-17 MED ORDER — FOLIC ACID 1 MG PO TABS: 1.0000 mg | ORAL_TABLET | Freq: Every day | ORAL | 0 refills | Status: DC

## 2020-03-17 MED ORDER — RIVAROXABAN 15 MG PO TABS: 15.0000 mg | ORAL_TABLET | Freq: Every day | ORAL | 1 refills | Status: DC

## 2020-03-17 MED ORDER — PANTOPRAZOLE SODIUM 40 MG PO TBEC
40.0000 mg | DELAYED_RELEASE_TABLET | Freq: Two times a day (BID) | ORAL | 0 refills | Status: DC
Start: 2020-03-17 — End: 2020-03-22

## 2020-03-17 MED ORDER — FUROSEMIDE 40 MG PO TABS: 40.0000 mg | ORAL_TABLET | Freq: Two times a day (BID) | ORAL | 1 refills | Status: DC

## 2020-03-17 MED ORDER — ADULT MULTIVITAMIN W/MINERALS CH: 1.0000 | ORAL_TABLET | Freq: Every day | ORAL | Status: AC

## 2020-03-17 NOTE — Progress Notes (Signed)
Discharge instructions were reviewed. Patient denied questions, concerns at this time. Heart failure packet reviewed also. No change from am assessment. Pt remains alert, oriented(x4), ambulatory without assistance.

## 2020-03-17 NOTE — Care Management Important Message (Signed)
Important Message  Patient Details IM Letter presented to the Patient Name: Douglas Ballard MRN: [id number] Date of Birth: [birth year]   Medicare Important Message Given:  Yes     Kerin Salen 03/17/2020, 10:18 AM

## 2020-03-18 ENCOUNTER — Other Ambulatory Visit: Payer: Self-pay

## 2020-03-18 ENCOUNTER — Telehealth: Payer: Self-pay

## 2020-03-18 ENCOUNTER — Encounter (HOSPITAL_COMMUNITY): Payer: Self-pay | Admitting: Gastroenterology

## 2020-03-18 ENCOUNTER — Encounter: Payer: Self-pay | Admitting: Gastroenterology

## 2020-03-18 MED ORDER — FUROSEMIDE 40 MG PO TABS: 40.0000 mg | ORAL_TABLET | Freq: Two times a day (BID) | ORAL | 1 refills | Status: DC

## 2020-03-18 NOTE — Discharge Summary (Signed)
Physician Discharge Summary  Douglas Ballard IRJ:[id number] DOB: 06/20/1951 DOA: 03/14/2020  PCP: Vivi Barrack, MD  Admit date: 03/14/2020 Discharge date: 7/29 /2021  Admitted From: Home Disposition:  Home.   Recommendations for Outpatient Follow-up:  1. Follow up with PCP in 1-2 weeks 2. Please obtain BMP/CBC in one week Please follow up with gastroenterology for biopsy results.   Discharge Condition: stable.  CODE STATUS:full code.  Diet recommendation: Heart Healthy   Brief/Interim Summary: 69 year old male prior history of CKD stage IV, diabetes mellitus, atrial fibrillation on Xarelto, chronic diastolic heart failure, colovesicular fistula status post colectomy, alcohol abuse, tobacco abuse, anemia of chronic disease , presents to ED with shortness of breath probably secondary to fluid overload.  Patient was admitted for evaluation and management of acute on chronic diastolic heart failure.  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  Last echocardiogram shows preserved left ventricular ejection fraction of 55%.  He was initially started on IV Lasix 40 mg twice daily with appropriate diuresis. Transitioned to oral lasix on discharge as he appears to be compensated. Diuresed 2.6 lit since admission.     Chronic atrial fibrillation Rate controlled and in normal sinus rhythm at this time. On Xarelto for anticoagulation  which has been held for heme positive stools to be resumed in two days as per gi recommendations.  CHA2DS2-VASc score is 4. Echocardiogram showed preserved left ventricular ejection fraction.   Stage IV CKD Creatinine appears to be at baseline at this time. Appreciate nephrology recommendations. Plan for outpatient follow-up.  Anemia of chronic disease Heme positive stools. Acute anemia of blood loss probably GI source.  Superimposed on anemia of chronic disease from stage IV CKD Patient underwent EGD and colonoscopy today showing gastritis on EGD and diverticulosis of the sigmoid colon , 4 polyps were removed and biopsied. Recommend outpatient follow up with gastroenterology for biopsy results.    Type 2 diabetes A1c is 5.9     Hypertensive crisis Blood pressure parameters are controlled at this time.  Continue with clonidine and BiDil  Discharge Instructions  Discharge Instructions    Diet - low sodium heart healthy   Complete by: As directed    Discharge instructions   Complete by: As directed    Please follow up with gastroenterology regarding the biopsy results.     Allergies as of 03/17/2020      Reactions   Hydrochlorothiazide Other (See Comments)   Pt gets hyponatremia   Lasix [furosemide] Other (See Comments)   Sodium levels drop when take   Nsaids Other (See Comments)   Kidney disease/failure   Sulfa Antibiotics Other (See Comments)   headaches      Medication List    TAKE these medications   cloNIDine 0.1 MG tablet Commonly known as: CATAPRES Take 0.1 mg by mouth daily.   diltiazem 240 MG 24 hr capsule Commonly known as: CARDIZEM CD Take 240 mg by mouth  daily.   FeroSul 325 (65 FE) MG tablet Generic drug: ferrous sulfate TAKE 1 TABLET EVERY DAY What changed:   how much to take  when to take this   folic acid 1 MG tablet Commonly known as: FOLVITE Take 1 tablet (1 mg total) by mouth daily.   furosemide 40 MG tablet Commonly  known as: LASIX Take 1 tablet (40 mg total) by mouth 2 (two) times daily. What changed:   how much to take  when to take this   hydrOXYzine 50 MG tablet Commonly known as: ATARAX/VISTARIL TAKE 1/2-2 TABLETS BY MOUTH AT BEDTIME AS NEEDED FOR ANXIETY (INSOMNIA). What changed: See the new instructions.   isosorbide-hydrALAZINE 20-37.5 MG tablet Commonly known as: BIDIL Take 2 tablets by mouth 2 (two) times daily.   Januvia 100 MG tablet Generic drug: sitaGLIPtin TAKE 1 TABLET BY MOUTH EVERY DAY What changed: how much to take   labetalol 200 MG tablet Commonly known as: NORMODYNE Take 2 tablets (400 mg total) by mouth 2 (two) times daily. What changed:   how much to take  when to take this   multivitamin with minerals Tabs tablet Take 1 tablet by mouth daily.   NyQuil HBP Cold & Flu 15-6.25-325 MG/15ML Liqd Generic drug: DM-Doxylamine-Acetaminophen Take 30 mLs by mouth at bedtime as needed (cough/sleep).   pantoprazole 40 MG tablet Commonly known as: Protonix Take 1 tablet (40 mg total) by mouth 2 (two) times daily.   Rivaroxaban 15 MG Tabs tablet Commonly known as: Xarelto Take 1 tablet (15 mg total) by mouth daily with supper. Start taking on: March 19, 2020   thiamine 100 MG tablet Take 1 tablet (100 mg total) by mouth daily.   triamcinolone cream 0.1 % Commonly known as: KENALOG Apply topically 2 (two) times daily. What changed:   how much to take  when to take this  reasons to take this   Vitamin D (Ergocalciferol) 1.25 MG (50000 UNIT) Caps capsule Commonly known as: DRISDOL Take 1 capsule (50,000 Units total) by mouth every Wednesday.   zolpidem 10 MG tablet Commonly known as: AMBIEN Take 1 tablet (10 mg total) by mouth at bedtime as needed. for sleep       Follow-up Information    Vivi Barrack, MD. Schedule an appointment as soon as possible for a visit in 1 week(s).   Specialty: Family Medicine Contact information: [place] [address] [phone]        [place], Peter M, MD .   Specialty: Cardiology Contact information: [address] [place] [address] [phone]              Allergies  Allergen Reactions  . Hydrochlorothiazide Other (See Comments)    Pt gets hyponatremia  . Lasix [Furosemide] Other (See Comments)    Sodium levels drop when take  . Nsaids Other (See Comments)    Kidney disease/failure  . Sulfa Antibiotics Other (See Comments)    headaches    Consultations:  GI    Procedures/Studies: DG Chest 1 View  Result Date: 03/14/2020 CLINICAL DATA:  Shortness of breath for a few hours.  Out of Lasix. EXAM: CHEST  1 VIEW COMPARISON:  11/25/2019 FINDINGS: Normal heart size. Interstitial opacity with Kerley lines and small pleural effusions. No pneumothorax. IMPRESSION: CHF. Electronically Signed   By: Monte Fantasia M.D.   On: 03/14/2020 05:09   ECHOCARDIOGRAM COMPLETE  Result Date: 03/14/2020    ECHOCARDIOGRAM REPORT   Patient Name:  Douglas Ballard Date of Exam: 03/14/2020 Medical Rec #:  [id number]      Height:       69.0 in Accession #:    [id number]     Weight:       160.0 lb Date of Birth:  [birth year]     BSA:          1.879 m Patient Age:    25 years       BP:           198/83 mmHg Patient Gender: M              HR:           67 bpm. Exam Location:  Inpatient Procedure: 2D Echo, Cardiac Doppler and Color Doppler Indications:    R06.02 SOB; W29.56 Acute diastolic (congestive) heart failure  History:        Patient has prior history of Echocardiogram examinations, most                 recent 08/31/2018. CHF, Abnormal ECG, Arrythmias:Atrial                 Fibrillation, Signs/Symptoms:Dyspnea; Risk Factors:Current                 Smoker.  Sonographer:    Roseanna Rainbow RDCS Referring Phys: [id number] Harold Hedge  Sonographer Comments: Technically difficult study due to poor echo windows. Patient in high Fowler's position due to dyspnea. IMPRESSIONS  1. Left  ventricular ejection fraction, by estimation, is 55 to 60%. The left ventricle has normal function. The left ventricle has no regional wall motion abnormalities. There is moderate left ventricular hypertrophy. Left ventricular diastolic parameters are consistent with Grade III diastolic dysfunction (restrictive). Elevated left atrial pressure.  2. Right ventricular systolic function is normal. The right ventricular size is mildly enlarged. Tricuspid regurgitation signal is inadequate for assessing PA pressure.  3. Left atrial size was moderately dilated.  4. Right atrial size was mild to moderately dilated.  5. The mitral valve is grossly normal. Mild mitral valve regurgitation. No evidence of mitral stenosis.  6. The aortic valve is tricuspid. Aortic valve regurgitation is trivial. No aortic stenosis is present. Comparison(s): No significant change from prior study. FINDINGS  Left Ventricle: Left ventricular ejection fraction, by estimation, is 55 to 60%. The left ventricle has normal function. The left ventricle has no regional wall motion abnormalities. The left ventricular internal cavity size was normal in size. There is  moderate left ventricular hypertrophy. Left ventricular diastolic parameters are consistent with Grade III diastolic dysfunction (restrictive). Elevated left atrial pressure. Right Ventricle: The right ventricular size is mildly enlarged. No increase in right ventricular wall thickness. Right ventricular systolic function is normal. Tricuspid regurgitation signal is inadequate for assessing PA pressure. Left Atrium: Left atrial size was moderately dilated. Right Atrium: Right atrial size was mild to moderately dilated. Pericardium: Trivial pericardial effusion is present. Presence of pericardial fat pad. Mitral Valve: The mitral valve is grossly normal. There is mild thickening of the anterior and posterior mitral valve leaflet(s). Mild mitral valve regurgitation. No evidence of mitral valve  stenosis. Tricuspid Valve: The tricuspid valve is grossly normal. Tricuspid valve regurgitation is mild . No evidence of tricuspid stenosis. Aortic Valve: The aortic valve is tricuspid. Aortic valve regurgitation is trivial. No aortic stenosis is present. Pulmonic Valve: The pulmonic valve was grossly normal. Pulmonic valve regurgitation is not visualized. No evidence of pulmonic stenosis. Aorta: The aortic root is  normal in size and structure. IAS/Shunts: The atrial septum is grossly normal. Additional Comments: There is a small pleural effusion in both left and right lateral regions.  LEFT VENTRICLE PLAX 2D LVIDd:         5.42 cm      Diastology LVIDs:         3.79 cm      LV e' lateral:   7.43 cm/s LV PW:         1.61 cm      LV E/e' lateral: 17.9 LV IVS:        1.52 cm      LV e' medial:    4.93 cm/s LVOT diam:     2.30 cm      LV E/e' medial:  27.0 LV SV:         135 LV SV Index:   72 LVOT Area:     4.15 cm  LV Volumes (MOD) LV vol d, MOD A2C: 133.0 ml LV vol d, MOD A4C: 128.0 ml LV vol s, MOD A2C: 60.1 ml LV vol s, MOD A4C: 50.1 ml LV SV MOD A2C:     72.9 ml LV SV MOD A4C:     128.0 ml LV SV MOD BP:      74.2 ml RIGHT VENTRICLE             IVC RV S prime:     12.90 cm/s  IVC diam: 2.50 cm TAPSE (M-mode): 2.3 cm LEFT ATRIUM             Index       RIGHT ATRIUM           Index LA diam:        5.30 cm 2.82 cm/m  RA Area:     23.80 cm LA Vol (A2C):   60.0 ml 31.93 ml/m RA Volume:   85.90 ml  45.71 ml/m LA Vol (A4C):   83.9 ml 44.65 ml/m LA Biplane Vol: 72.1 ml 38.37 ml/m  AORTIC VALVE LVOT Vmax:   140.00 cm/s LVOT Vmean:  87.500 cm/s LVOT VTI:    0.326 m  AORTA Ao Root diam: 3.30 cm MITRAL VALVE MV Area (PHT): 5.20 cm     SHUNTS MV Decel Time: 146 msec     Systemic VTI:  0.33 m MV E velocity: 133.00 cm/s  Systemic Diam: 2.30 cm MV A velocity: 66.50 cm/s MV E/A ratio:  2.00 Eleonore Chiquito MD Electronically signed by Eleonore Chiquito MD Signature Date/Time: 03/14/2020/4:32:36 PM    Final        Subjective: NO NEW COMPLAINTS.   Discharge Exam: Vitals:   03/17/20 1009 03/17/20 1148  BP: (!) 164/100 (!) 154/83  Pulse: 95 84  Resp:  20  Temp:  97.8 F (36.6 C)  SpO2: 100% 97%   Vitals:   03/16/20 2206 03/17/20 0610 03/17/20 1009 03/17/20 1148  BP: (!) 147/90 (!) 153/81 (!) 164/100 (!) 154/83  Pulse: 66 85 95 84  Resp: 20 20  20   Temp:  98 F (36.7 C)  97.8 F (36.6 C)  TempSrc:  Oral  Oral  SpO2: 98% 99% 100% 97%  Weight:      Height:        General: Pt is alert, awake, not in acute distress Cardiovascular: RRR, S1/S2 +, no rubs, no gallops Respiratory: CTA bilaterally, no wheezing, no rhonchi Abdominal: Soft, NT, ND, bowel sounds + Extremities: no edema, no cyanosis    The results of  significant diagnostics from this hospitalization (including imaging, microbiology, ancillary and laboratory) are listed below for reference.     Microbiology: Recent Results (from the past 240 hour(s))  SARS Coronavirus 2 by RT PCR (hospital order, performed in [facility] hospital lab) Nasopharyngeal Nasopharyngeal Swab     Status: None   Collection Time: 03/14/20  5:03 AM   Specimen: Nasopharyngeal Swab  Result Value Ref Range Status   SARS Coronavirus 2 NEGATIVE NEGATIVE Final    Comment: (NOTE) SARS-CoV-2 target nucleic acids are NOT DETECTED.  The SARS-CoV-2 RNA is generally detectable in upper and lower respiratory specimens during the acute phase of infection. The lowest concentration of SARS-CoV-2 viral copies this assay can detect is 250 copies / mL. A negative result does not preclude SARS-CoV-2 infection and should not be used as the sole basis for treatment or other patient management decisions.  A negative result may occur with improper specimen collection / handling, submission of specimen other than nasopharyngeal swab, presence of viral mutation(s) within the areas targeted by this assay, and inadequate number of viral copies (<250 copies / mL). A  negative result must be combined with clinical observations, patient history, and epidemiological information.  Fact Sheet for Patients:   StrictlyIdeas.no  Fact Sheet for Healthcare Providers: BankingDealers.co.za  This test is not yet approved or  cleared by the [place] FDA and has been authorized for detection and/or diagnosis of SARS-CoV-2 by FDA under an Emergency Use Authorization (EUA).  This EUA will remain in effect (meaning this test can be used) for the duration of the COVID-19 declaration under Section 564(b)(1) of the Act, 21 U.S.C. section 360bbb-3(b)(1), unless the authorization is terminated or revoked sooner.  Performed at [facility], [place] [address] [place], [place] [address]      Labs: BNP (last 3 results) Recent Labs    03/14/20 0349  BNP 119.1*   Basic Metabolic Panel: Recent Labs  Lab 03/14/20 0349 03/15/20 0522 03/16/20 0528  NA 136 132* 140  K 4.7 4.2 4.0  CL 106 104 108  CO2 21* 18* 19*  GLUCOSE 172* 219* 122*  BUN 42* 46* 47*  CREATININE 2.82* 2.90* 2.76*  CALCIUM 8.5* 8.6* 8.9  MG  --  1.8  --   PHOS  --  5.0*  --    Liver Function Tests: Recent Labs  Lab 03/15/20 0522  ALBUMIN 3.2*   No results for input(s): LIPASE, AMYLASE in the last 168 hours. No results for input(s): AMMONIA in the last 168 hours. CBC: Recent Labs  Lab 03/14/20 0349 03/15/20 0522 03/16/20 0528  WBC 7.0 9.4 7.5  HGB 7.8* 7.6* 9.4*  HCT 24.1* 22.7* 28.1*  MCV 100.4* 95.4 95.9  PLT 240 211 258   Cardiac Enzymes: No results for input(s): CKTOTAL, CKMB, CKMBINDEX, TROPONINI in the last 168 hours. BNP: Invalid input(s): POCBNP CBG: Recent Labs  Lab 03/16/20 1327 03/16/20 1718 03/16/20 2020 03/17/20 0720 03/17/20 1147  GLUCAP 147* 331* 166* 119* 249*   D-Dimer No results for input(s): DDIMER in the last 72 hours. Hgb A1c No results for input(s): HGBA1C in the last 72  hours. Lipid Profile No results for input(s): CHOL, HDL, LDLCALC, TRIG, CHOLHDL, LDLDIRECT in the last 72 hours. Thyroid function studies No results for input(s): TSH, T4TOTAL, T3FREE, THYROIDAB in the last 72 hours.  Invalid input(s): FREET3 Anemia work up No results for input(s): VITAMINB12, FOLATE, FERRITIN, TIBC, IRON, RETICCTPCT in the last 72 hours. Urinalysis    Component  Value Date/Time   COLORURINE YELLOW 02/06/2016 0345   APPEARANCEUR CLEAR 02/06/2016 0345   LABSPEC 1.014 02/06/2016 0345   PHURINE 6.0 02/06/2016 0345   GLUCOSEU NEGATIVE 02/06/2016 0345   HGBUR MODERATE (A) 02/06/2016 0345   BILIRUBINUR NEGATIVE 02/06/2016 0345   KETONESUR NEGATIVE 02/06/2016 0345   PROTEINUR 100 (A) 02/06/2016 0345   UROBILINOGEN 1.0 04/14/2014 0759   NITRITE NEGATIVE 02/06/2016 0345   LEUKOCYTESUR NEGATIVE 02/06/2016 0345   Sepsis Labs Invalid input(s): PROCALCITONIN,  WBC,  LACTICIDVEN Microbiology Recent Results (from the past 240 hour(s))  SARS Coronavirus 2 by RT PCR (hospital order, performed in [place] hospital lab) Nasopharyngeal Nasopharyngeal Swab     Status: None   Collection Time: 03/14/20  5:03 AM   Specimen: Nasopharyngeal Swab  Result Value Ref Range Status   SARS Coronavirus 2 NEGATIVE NEGATIVE Final    Comment: (NOTE) SARS-CoV-2 target nucleic acids are NOT DETECTED.  The SARS-CoV-2 RNA is generally detectable in upper and lower respiratory specimens during the acute phase of infection. The lowest concentration of SARS-CoV-2 viral copies this assay can detect is 250 copies / mL. A negative result does not preclude SARS-CoV-2 infection and should not be used as the sole basis for treatment or other patient management decisions.  A negative result may occur with improper specimen collection / handling, submission of specimen other than nasopharyngeal swab, presence of viral mutation(s) within the areas targeted by this assay, and inadequate number of viral  copies (<250 copies / mL). A negative result must be combined with clinical observations, patient history, and epidemiological information.  Fact Sheet for Patients:   StrictlyIdeas.no  Fact Sheet for Healthcare Providers: BankingDealers.co.za  This test is not yet approved or  cleared by the [place] FDA and has been authorized for detection and/or diagnosis of SARS-CoV-2 by FDA under an Emergency Use Authorization (EUA).  This EUA will remain in effect (meaning this test can be used) for the duration of the COVID-19 declaration under Section 564(b)(1) of the Act, 21 U.S.C. section 360bbb-3(b)(1), unless the authorization is terminated or revoked sooner.  Performed at [facility], [place] [address] [place], [place] [address]      Time coordinating discharge: 28 MIN  SIGNED:   Hosie Poisson, MD  [facility]

## 2020-03-18 NOTE — Telephone Encounter (Cosign Needed)
Transition Care Management Follow-up Telephone Call  Date of discharge and from where: 03/17/20 [name]  How have you been since you were released from the hospital? Fine  Any questions or concerns? No   Items Reviewed:  Did the pt receive and understand the discharge instructions provided? Yes   Medications obtained and verified? No   Any new allergies since your discharge? No   Dietary orders reviewed? No  Do you have support at home? Yes   Functional Questionnaire: (I = Independent and D = Dependent) ADLs: I  Bathing/Dressing- I  Meal Prep- I  Eating- I  Maintaining continence- I  Transferring/Ambulation- I  Managing Meds- I  Follow up appointments reviewed:   PCP Hospital f/u appt confirmed? Yes  Scheduled to see Dr [name] on 03/22/20 @ 2:40.  [facility] f/u appt confirmed? Yes  Scheduled to see  Dr Almyra Deforest on 04/06/20 @ 11:45a.m.  Are transportation arrangements needed? No   If their condition worsens, is the pt aware to call PCP or go to the Emergency Dept.? Yes  Was the patient provided with contact information for the PCP's office or ED? Yes  Was to pt encouraged to call back with questions or concerns? Yes

## 2020-03-22 ENCOUNTER — Ambulatory Visit (INDEPENDENT_AMBULATORY_CARE_PROVIDER_SITE_OTHER): Payer: Medicare HMO | Admitting: Family Medicine

## 2020-03-22 ENCOUNTER — Other Ambulatory Visit: Payer: Self-pay

## 2020-03-22 ENCOUNTER — Encounter: Payer: Self-pay | Admitting: Family Medicine

## 2020-03-22 VITALS — BP 159/80 | HR 58 | Temp 97.2°F | Ht 69.0 in | Wt 158.6 lb

## 2020-03-22 DIAGNOSIS — E1159 Type 2 diabetes mellitus with other circulatory complications: Secondary | ICD-10-CM | POA: Diagnosis not present

## 2020-03-22 DIAGNOSIS — K2970 Gastritis, unspecified, without bleeding: Secondary | ICD-10-CM | POA: Diagnosis not present

## 2020-03-22 DIAGNOSIS — N183 Chronic kidney disease, stage 3 unspecified: Secondary | ICD-10-CM

## 2020-03-22 DIAGNOSIS — I10 Essential (primary) hypertension: Secondary | ICD-10-CM

## 2020-03-22 DIAGNOSIS — I152 Hypertension secondary to endocrine disorders: Secondary | ICD-10-CM

## 2020-03-22 DIAGNOSIS — I5032 Chronic diastolic (congestive) heart failure: Secondary | ICD-10-CM | POA: Diagnosis not present

## 2020-03-22 MED ORDER — PANTOPRAZOLE SODIUM 40 MG PO TBEC: 40.0000 mg | DELAYED_RELEASE_TABLET | Freq: Two times a day (BID) | ORAL | 3 refills | Status: DC

## 2020-03-22 NOTE — Assessment & Plan Note (Signed)
Above goal though typically well controlled per patient.  Continue current medications per cardiology.

## 2020-03-22 NOTE — Progress Notes (Signed)
Chief Complaint:  Douglas Ballard is a 69 y.o. male who presents today for a TCM visit.  Assessment/Plan:  Chronic Problems Addressed Today: Chronic diastolic CHF (congestive heart failure) (HCC) Doing much better.  Recently hospitalized due to acute exacerbation but euvolemic today.  No signs of volume overload.  Patient is at high risk for readmission due to multiple admissions in the past.  He will continue management per cardiology.  Hypertension associated with diabetes ([place]) Above goal though typically well controlled per patient.  Continue current medications per cardiology.  CKD (chronic kidney disease) stage 3, GFR 30-59 ml/min (HCC) Continue management per nephrology.  Gastritis Continue Protonix.  He will need to follow-up with GI.    Subjective:  HPI:  Summary of Hospital admission: Reason for admission: Heart Failure Date of admission: 03/14/2020 Date of discharge: 03/17/2020 Date of Interactive contact: 03/18/2020 Summary of Hospital course: Patient presented to the ED on 7/26 with shortness of breath.  He was admitted for IV diuresis and fluid management.  Echocardiogram was done which showed EF of 55%.  He was given IV Lasix and transition to oral Lasix at discharge.  He was diuresed 2.6 L during this admission.  He was found to have anemia and positive FOBT.  Underwent EGD and colonoscopy which showed gastritis and diverticulosis.  He was discharged home in stable condition.  Interim history:  He has done well since coming home.  He has been compliant with his medications that he missed doses.  He thinks that during the transition to [place] he was out of his Lasix for a period of several weeks and that is what caused his fluid overload and admission to the hospital.  He has been tolerating the Protonix well.  Breathing is normal.   ROS: Per HPI, otherwise a complete review of systems was negative.   PMH:  The following were reviewed and entered/updated  in epic: Past Medical History:  Diagnosis Date  . 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   Marland Kitchen Anxiety   . Atrial fibrillation (Douglas Ballard)   . Atypical mole 05/13/2008   LEFT MEDIAL CHEST SLIGHT/MOD.  Marland Kitchen Atypical nevi 05/13/2008   LEFT LATERAL CHEST SLIGHT  . Atypical nevi 04/29/2015   RIGHT POST SHOULDER MOD/SEV. TX W/S  . Atypical nevi 04/29/2015   RIGHT MID ABDOMEN MODERATE TX W/S  . Atypical nevi 09/27/2015   LEFT UPPER ARM SEVERE TX W/S  . Atypical nevi 09/27/2015   MID LOWER BACK MODERATE  . Atypical nevi 09/27/2015   MID UPPER BACK SEVERE TX EXC  . Atypical nevi 09/27/2015   LEFT UPPER BACK MOD.SEV TX EXC  . Atypical nevi 03/23/2016   RIGHT LOWER BACK MILD  . Atypical nevi 03/23/2016   LEFT SIDE TORSO MILD  . Atypical nevi 06/14/2016   LEFT LOWER BACK MOD/SEV   . Atypical nevi 06/14/2016   LEFT CHEST MODERATE  . Atypical nevi 08/30/2017   MID UPPER BACK SUP MODERATE  . Atypical nevi 08/30/2017   RIGHT CHEST SEVERE   . Atypical nevi 07/25/2018   LEFT SHOULDER ANTERIOR TX WIDERSHAVE  . Atypical nevi 07/25/2018   LEFT SHOULDER POST MODERATE  . Atypical nevi 08/25/2019   LEFT SIDE ABDOMEN MODERATE FREE  . Atypical nevi 11/27/2019   RIGHT SHOULDER MODERATE TX W/S  . Atypical nevi 11/27/2019   RIGHT UPPER BACK MOD/SEVERE TX  W/S  . Atypical nevi 11/27/2019   LEFT MID BACK SEVERE TX W/S  . Cancer [facility]) 2012   Prostate surgery  . Chronic diastolic CHF (congestive heart failure) (Douglas Ballard) 11/01/2014   Echo 8/15: Mild LVH, EF 50-55%, moderate BAE  //  b. Echo 7/17: EF 55-60%, normal wall motion, grade 2 diastolic dysfunction, MAC, mild MR, moderate LAE, trivial PI  . Chronic kidney disease   . Compression fracture of thoracic vertebra (Douglas Ballard) 06/26/2011  . Diabetes mellitus type 2 in nonobese (HCC)   . Fistula, bladder   . Frequent urination at night   .  History of adenomatous polyp of colon 06/14/2014  . History of nuclear stress test    a. Myoview 10/15: Overall Impression: Low risk stress nuclear study demonstrating mild baseline ST-T changes with normal myocardial perfusion and low normal EF of 50%. // b.Myoview 7/17: EF 50%, no ischemia or scar, low risk (EF normal by recent echo)  . Hypertension   . PNA (pneumonia) 06/26/2011  . Sepsis due to urinary tract infection (Douglas Ballard) 04/16/2014  . Stroke (Douglas Ballard) sept 1, 2015   tia x 3   Patient Active Problem List   Diagnosis Date Noted  . Gastritis 03/22/2020  . Diverticulosis of colon without hemorrhage   . Grade II internal hemorrhoids   . Polyp of ascending colon   . Polyp of transverse colon   . Atrial fibrillation, chronic (Douglas Ballard) 03/14/2020  . CKD stage G4/A1, GFR 15-29 and albumin creatinine ratio <30 mg/g (HCC) 03/14/2020  . Hypertensive urgency 03/14/2020  . Anemia due to chronic kidney disease 08/31/2018  . Stress 08/01/2018  . Type 2 diabetes mellitus with stage 4 chronic kidney disease (Douglas Ballard) 09/06/2017  . CKD (chronic kidney disease) stage 3, GFR 30-59 ml/min (HCC) 02/07/2016  . Insomnia 09/19/2015  . Coarse tremors 09/19/2015  . Chronic diastolic CHF (congestive heart failure) (Douglas Ballard) 11/01/2014  . History of adenomatous polyp of colon 06/14/2014  . Paroxysmal atrial fibrillation (Douglas Ballard) 05/19/2014  . Elevated bilirubin 04/14/2014  . 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 Hypertension associated with diabetes (Douglas Ballard) 06/26/2011  . Personal history of prostate cancer s/p prostatectomy 2012 06/26/2011   Past Surgical History:  Procedure Laterality Date  . BIOPSY  03/16/2020   Procedure: BIOPSY;  Surgeon: Lavena Bullion, DO;  Location: [facility];  Service: Gastroenterology;;  . COLON SURGERY    . COLONOSCOPY N/A 06/14/2014   Procedure: COLONOSCOPY;  Surgeon: Gatha Mayer, MD;  Location: [facility];  Service: Endoscopy;  Laterality: N/A;  . COLONOSCOPY WITH  PROPOFOL N/A 03/16/2020   Procedure: COLONOSCOPY WITH PROPOFOL;  Surgeon: Lavena Bullion, DO;  Location: [facility];  Service: Gastroenterology;  Laterality: N/A;  . CYSTOSCOPY WITH STENT PLACEMENT Bilateral 06/15/2014   Procedure: CYSTOSCOPY WITH BILATERAL STENT PLACEMENT;  Surgeon: Bernestine Amass, MD;  Location: [facility];  Service: Urology;  Laterality: Bilateral;  . ESOPHAGOGASTRODUODENOSCOPY (EGD) WITH PROPOFOL N/A 03/16/2020   Procedure: ESOPHAGOGASTRODUODENOSCOPY (EGD) WITH PROPOFOL;  Surgeon: Lavena Bullion, DO;  Location: [facility];  Service: Gastroenterology;  Laterality: N/A;  . LIPOMA EXCISION  2012   Dr Nonah Mattes  . moles removed     Back  . POLYPECTOMY  03/16/2020   Procedure: POLYPECTOMY;  Surgeon: Lavena Bullion, DO;  Location: [facility];  Service: Gastroenterology;;  . PROCTOSCOPY N/A 06/15/2014   Procedure: RIGID PROCTOSCOPY;  Surgeon: Michael Boston, MD;  Location: [facility];  Service: General;  Laterality: N/A;  . RADIOLOGY WITH ANESTHESIA N/A  07/28/2014   Procedure: Embolization;  Surgeon: Luanne Bras, MD;  Location: [facility];  Service: Radiology;  Laterality: N/A;  . ROBOT ASSISTED LAPAROSCOPIC RADICAL PROSTATECTOMY  01/31/2011   Robotic-assisted laparoscopic radical retropubic    Family History  Problem Relation Age of Onset  . Atrial fibrillation Father        onset 26s. Had pacemaker placed for sinus pause  . Prostate cancer Father   . Breast cancer Mother   . Lung cancer Mother   . Leukemia Brother   . Colon polyps Brother   . Colon cancer Neg Hx   . Diabetes Neg Hx     Medications- Reconciled discharge and current medications in Epic.  Current Outpatient Medications  Medication Sig Dispense Refill  . cloNIDine (CATAPRES) 0.1 MG tablet Take 0.1 mg by mouth daily.    Marland Kitchen diltiazem (CARDIZEM CD) 240 MG 24 hr capsule Take 240 mg by mouth daily.    . FEROSUL 325 (65 Fe) MG tablet TAKE 1 TABLET EVERY DAY (Patient taking differently: Take 325 mg by  mouth daily with breakfast. ) 90 tablet 0  . folic acid (FOLVITE) 1 MG tablet Take 1 tablet (1 mg total) by mouth daily. 30 tablet 0  . furosemide (LASIX) 40 MG tablet Take 1 tablet (40 mg total) by mouth 2 (two) times daily. 60 tablet 1  . hydrOXYzine (ATARAX/VISTARIL) 50 MG tablet TAKE 1/2-2 TABLETS BY MOUTH AT BEDTIME AS NEEDED FOR ANXIETY (INSOMNIA). 180 tablet 1  . isosorbide-hydrALAZINE (BIDIL) 20-37.5 MG tablet Take 2 tablets by mouth 2 (two) times daily.     Marland Kitchen JANUVIA 100 MG tablet TAKE 1 TABLET BY MOUTH EVERY DAY (Patient taking differently: Take 100 mg by mouth daily. ) 90 tablet 1  . labetalol (NORMODYNE) 200 MG tablet Take 2 tablets (400 mg total) by mouth 2 (two) times daily. (Patient taking differently: Take 200 mg by mouth daily. )    . Multiple Vitamin (MULTIVITAMIN WITH MINERALS) TABS tablet Take 1 tablet by mouth daily.    . pantoprazole (PROTONIX) 40 MG tablet Take 1 tablet (40 mg total) by mouth 2 (two) times daily. 180 tablet 3  . Rivaroxaban (XARELTO) 15 MG TABS tablet Take 1 tablet (15 mg total) by mouth daily with supper. 90 tablet 1  . thiamine 100 MG tablet Take 1 tablet (100 mg total) by mouth daily.    Marland Kitchen triamcinolone cream (KENALOG) 0.1 % Apply topically 2 (two) times daily. (Patient taking differently: Apply 1 application topically 2 (two) times daily as needed (rash). ) 30 g 6  . Vitamin D, Ergocalciferol, (DRISDOL) 1.25 MG (50000 UT) CAPS capsule Take 1 capsule (50,000 Units total) by mouth every Wednesday. 12 capsule 0  . zolpidem (AMBIEN) 10 MG tablet Take 1 tablet (10 mg total) by mouth at bedtime as needed. for sleep 30 tablet 5  . DM-Doxylamine-Acetaminophen (NYQUIL HBP COLD & FLU) 15-6.25-325 MG/15ML LIQD Take 30 mLs by mouth at bedtime as needed (cough/sleep). (Patient not taking: Reported on 03/22/2020)    . labetalol (NORMODYNE) 200 MG tablet Take by mouth. (Patient not taking: Reported on 03/22/2020)     No current facility-administered medications for this  visit.    Allergies-reviewed and updated Allergies  Allergen Reactions  . Hydrochlorothiazide Other (See Comments)    Pt gets hyponatremia  . Lasix [Furosemide] Other (See Comments)    Sodium levels drop when take  . Nsaids Other (See Comments)    Kidney disease/failure  . Sulfa Antibiotics Other (See Comments)  headaches    Social History   Socioeconomic History  . Marital status: Married    Spouse name: Not on file  . Number of children: 2  . Years of education: Not on file  . Highest education level: Not on file  Occupational History  . Occupation: TEFL teacher  Tobacco Use  . Smoking status: Former Smoker    Packs/day: 2.00    Years: 25.00    Pack years: 50.00    Types: Cigarettes    Quit date: 04/14/2014    Years since quitting: 5.9  . Smokeless tobacco: Never Used  Vaping Use  . Vaping Use: Never used  Substance and Sexual Activity  . Alcohol use: Yes    Alcohol/week: 7.0 - 10.0 standard drinks    Types: 7 - 10 Standard drinks or equivalent per week    Comment: 2 bottles of wine with wife a week; cocktail every other night  . Drug use: No  . Sexual activity: Not Currently  Other Topics Concern  . Not on file  Social History Narrative   Fun/Hobby: Walk, cards, gardening    Social Determinants of Health   Financial Resource Strain:   . Difficulty of Paying Living Expenses:   Food Insecurity:   . Worried About Charity fundraiser in the Last Year:   . Arboriculturist in the Last Year:   Transportation Needs:   . Film/video editor (Medical):   Marland Kitchen Lack of Transportation (Non-Medical):   Physical Activity:   . Days of Exercise per Week:   . Minutes of Exercise per Session:   Stress:   . Feeling of Stress :   Social Connections:   . Frequency of Communication with Friends and Family:   . Frequency of Social Gatherings with Friends and Family:   . Attends Religious Services:   . Active Member of Clubs or Organizations:   .  Attends Archivist Meetings:   Marland Kitchen Marital Status:         Objective:  Physical Exam: BP (!) 159/80   Pulse (!) 58   Temp (!) 97.2 F (36.2 C) (Temporal)   Ht _0  (1.753 m)   Wt 158 lb 9.6 oz (71.9 kg)   SpO2 99%   BMI 23.42 kg/m   Gen: NAD, resting comfortably CV: RRR with no murmurs appreciated Pulm: NWOB, CTAB with no crackles, wheezes, or rhonchi GI: Normal bowel sounds present. Soft, Nontender, Nondistended. MSK: No edema, cyanosis, or clubbing noted Skin: Warm, dry Neuro: Grossly normal, moves all extremities Psych: Normal affect and thought content      Marteze Vecchio M. [name], MD 03/22/2020 3:41 PM

## 2020-03-22 NOTE — Assessment & Plan Note (Signed)
Continue Protonix.  He will need to follow-up with GI.

## 2020-03-22 NOTE — Assessment & Plan Note (Signed)
Doing much better.  Recently hospitalized due to acute exacerbation but euvolemic today.  No signs of volume overload.  Patient is at high risk for readmission due to multiple admissions in the past.  He will continue management per cardiology.

## 2020-03-22 NOTE — Assessment & Plan Note (Signed)
Continue management per nephrology

## 2020-03-22 NOTE — Patient Instructions (Signed)
It was very nice to see you today!  We will refill you medication today.  You should hear from GI soon.  No other changes today.   Take care, Dr [name]  Please try these tips to maintain a healthy lifestyle:   Eat at least 3 REAL meals and 1-2 snacks per day.  Aim for no more than 5 hours between eating.  If you eat breakfast, please do so within one hour of getting up.    Each meal should contain half fruits/vegetables, one quarter protein, and one quarter carbs (no bigger than a computer mouse)   Cut down on sweet beverages. This includes juice, soda, and sweet tea.     Drink at least 1 glass of water with each meal and aim for at least 8 glasses per day   Exercise at least 150 minutes every week.

## 2020-03-25 ENCOUNTER — Other Ambulatory Visit: Payer: Self-pay | Admitting: Family Medicine

## 2020-04-04 ENCOUNTER — Encounter: Payer: Self-pay | Admitting: Family Medicine

## 2020-04-04 DIAGNOSIS — F5101 Primary insomnia: Secondary | ICD-10-CM

## 2020-04-05 ENCOUNTER — Other Ambulatory Visit: Payer: Self-pay | Admitting: *Deleted

## 2020-04-05 DIAGNOSIS — F5101 Primary insomnia: Secondary | ICD-10-CM

## 2020-04-05 MED ORDER — ZOLPIDEM TARTRATE 10 MG PO TABS: 10.0000 mg | ORAL_TABLET | Freq: Every evening | ORAL | 5 refills | Status: DC | PRN

## 2020-04-06 ENCOUNTER — Other Ambulatory Visit: Payer: Self-pay

## 2020-04-06 ENCOUNTER — Ambulatory Visit: Payer: Medicare HMO | Admitting: Physician Assistant

## 2020-04-06 ENCOUNTER — Encounter: Payer: Self-pay | Admitting: Physician Assistant

## 2020-04-06 VITALS — BP 121/63 | HR 45 | Temp 98.0°F | Ht 69.0 in | Wt 142.4 lb

## 2020-04-06 DIAGNOSIS — N184 Chronic kidney disease, stage 4 (severe): Secondary | ICD-10-CM | POA: Diagnosis not present

## 2020-04-06 DIAGNOSIS — I480 Paroxysmal atrial fibrillation: Secondary | ICD-10-CM | POA: Diagnosis not present

## 2020-04-06 DIAGNOSIS — E119 Type 2 diabetes mellitus without complications: Secondary | ICD-10-CM | POA: Diagnosis not present

## 2020-04-06 DIAGNOSIS — I10 Essential (primary) hypertension: Secondary | ICD-10-CM

## 2020-04-06 DIAGNOSIS — R5383 Other fatigue: Secondary | ICD-10-CM

## 2020-04-06 DIAGNOSIS — R001 Bradycardia, unspecified: Secondary | ICD-10-CM | POA: Diagnosis not present

## 2020-04-06 MED ORDER — DILTIAZEM HCL ER BEADS 120 MG PO CP24: 240.0000 mg | ORAL_CAPSULE | Freq: Every day | ORAL | 6 refills | Status: DC

## 2020-04-06 MED ORDER — LABETALOL HCL 200 MG PO TABS: 200.0000 mg | ORAL_TABLET | Freq: Two times a day (BID) | ORAL | 6 refills | Status: AC

## 2020-04-06 NOTE — Progress Notes (Signed)
Cardiology Office Note:    Date:  04/08/2020   ID:  [name], DOB [birth year], MRN [id number]  PCP:  Vivi Barrack, MD  [facility] Cardiologist:  Peter Martinique, MD  [facility] Electrophysiologist:  None   Referring MD: Vivi Barrack, MD   Chief Complaint  Patient presents with  . Follow-up    seen for Dr. Martinique    History of Present Illness:    Douglas Ballard is a 69 y.o. male with a hx of hypertension, PAF, CKD, tobacco abuse, alcohol abuse, DM 2, and colovesical fistula.  He was admitted in August 2015 with acute renal failure, sepsis, expressive aphasia, diastolic heart failure in the setting of A. fib with RVR.  He also had alcohol withdrawal during the same hospitalization.  Echocardiogram demonstrated normal EF, moderate biatrial enlargement, mild LVH.  Myoview demonstrated normal perfusion.  He had recurrent atrial fibrillation in June 2017.  He was seen in the ED in June 2017 with chest pain and hematuria.  D-dimer was negative, cardiac enzymes were negative.  Repeat Myoview showed normal perfusion, EF 50%.  He was admitted for acute on chronic diastolic heart failure in January 2020 and underwent IV diuresis.  Creatinine trended up to 3.3 before trending back down. Echocardiogram obtained in 2020 showed EF 55 to 60%.  I last saw the patient on 05/24/2019, at which time he was doing well.  Heart rate was 54 bpm at the time.   More recently, patient was admitted to the hospital in July 2021 with acute on chronic diastolic heart failure.  Echocardiogram obtained on 03/14/2020 showed EF 55 to 77%, grade 3 diastolic dysfunction, moderate LAE, mild to moderate right atrial enlargement, mild MR.  Due to heme positive stool, Xarelto was held for a few days before resuming.  During the meantime, he underwent endoscopy and colonoscopy by GI service.  Patient presents today for evaluation of fatigue.  He denies any dizziness or feeling of passing out.  Heart rate has been in the  40s.  I suspect this is was causing his fatigue.  Past Medical History:  Diagnosis Date  . Alcohol withdrawal seizure ([place]) 06/28/2011   Alcohol withdraw seizure prior to admission is suspected from history given by family & Post ictal appearance in the ED.   Marland Kitchen Anxiety   . Atrial fibrillation ([place])   . Atypical mole 05/13/2008   LEFT MEDIAL CHEST SLIGHT/MOD.  Marland Kitchen Atypical nevi 05/13/2008   LEFT LATERAL CHEST SLIGHT  . Atypical nevi 04/29/2015   RIGHT POST SHOULDER MOD/SEV. TX W/S  . Atypical nevi 04/29/2015   RIGHT MID ABDOMEN MODERATE TX W/S  . Atypical nevi 09/27/2015   LEFT UPPER ARM SEVERE TX W/S  . Atypical nevi 09/27/2015   MID LOWER BACK MODERATE  . Atypical nevi 09/27/2015   MID UPPER BACK SEVERE TX EXC  . Atypical nevi 09/27/2015   LEFT UPPER BACK MOD.SEV TX EXC  . Atypical nevi 03/23/2016   RIGHT LOWER BACK MILD  . Atypical nevi 03/23/2016   LEFT SIDE TORSO MILD  . Atypical nevi 06/14/2016   LEFT LOWER BACK MOD/SEV   . Atypical nevi 06/14/2016   LEFT CHEST MODERATE  . Atypical nevi 08/30/2017   MID UPPER BACK SUP MODERATE  . Atypical nevi 08/30/2017   RIGHT CHEST SEVERE   . Atypical nevi 07/25/2018   LEFT SHOULDER ANTERIOR TX WIDERSHAVE  . Atypical nevi 07/25/2018   LEFT SHOULDER POST MODERATE  . Atypical nevi 08/25/2019   LEFT  SIDE ABDOMEN MODERATE FREE  . Atypical nevi 11/27/2019   RIGHT SHOULDER MODERATE TX W/S  . Atypical nevi 11/27/2019   RIGHT UPPER BACK MOD/SEVERE TX W/S  . Atypical nevi 11/27/2019   LEFT MID BACK SEVERE TX W/S  . Cancer [facility]) 2012   Prostate surgery  . Chronic diastolic CHF (congestive heart failure) ([place]) 11/01/2014   Echo 8/15: Mild LVH, EF 50-55%, moderate BAE  //  b. Echo 7/17: EF 55-60%, normal wall motion, grade 2 diastolic dysfunction, MAC, mild MR, moderate LAE, trivial PI  . Chronic kidney disease   . Compression fracture of thoracic vertebra ([place]) 06/26/2011  . Diabetes mellitus type 2 in nonobese (HCC)   . Fistula,  bladder   . Frequent urination at night   . History of adenomatous polyp of colon 06/14/2014  . History of nuclear stress test    a. Myoview 10/15: Overall Impression: Low risk stress nuclear study demonstrating mild baseline ST-T changes with normal myocardial perfusion and low normal EF of 50%. // b.Myoview 7/17: EF 50%, no ischemia or scar, low risk (EF normal by recent echo)  . Hypertension   . PNA (pneumonia) 06/26/2011  . Sepsis due to urinary tract infection ([place]) 04/16/2014  . Stroke ([place]) sept 1, 2015   tia x 3    Past Surgical History:  Procedure Laterality Date  . BIOPSY  03/16/2020   Procedure: BIOPSY;  Surgeon: Lavena Bullion, DO;  Location: [facility];  Service: Gastroenterology;;  . COLON SURGERY    . COLONOSCOPY N/A 06/14/2014   Procedure: COLONOSCOPY;  Surgeon: Gatha Mayer, MD;  Location: [facility];  Service: Endoscopy;  Laterality: N/A;  . COLONOSCOPY WITH PROPOFOL N/A 03/16/2020   Procedure: COLONOSCOPY WITH PROPOFOL;  Surgeon: Lavena Bullion, DO;  Location: [facility];  Service: Gastroenterology;  Laterality: N/A;  . CYSTOSCOPY WITH STENT PLACEMENT Bilateral 06/15/2014   Procedure: CYSTOSCOPY WITH BILATERAL STENT PLACEMENT;  Surgeon: Bernestine Amass, MD;  Location: [facility];  Service: Urology;  Laterality: Bilateral;  . ESOPHAGOGASTRODUODENOSCOPY (EGD) WITH PROPOFOL N/A 03/16/2020   Procedure: ESOPHAGOGASTRODUODENOSCOPY (EGD) WITH PROPOFOL;  Surgeon: Lavena Bullion, DO;  [facility]: [facility];  Service: Gastroenterology;  Laterality: N/A;  . LIPOMA EXCISION  2012   Dr Nonah Mattes  . moles removed     Back  . POLYPECTOMY  03/16/2020   Procedure: POLYPECTOMY;  Surgeon: Lavena Bullion, DO;  Location: [facility];  Service: Gastroenterology;;  . PROCTOSCOPY N/A 06/15/2014   Procedure: RIGID PROCTOSCOPY;  Surgeon: Michael Boston, MD;  Location: [facility];  Service: General;  Laterality: N/A;  . RADIOLOGY WITH ANESTHESIA N/A 07/28/2014   Procedure:  Embolization;  Surgeon: Luanne Bras, MD;  Location: [facility];  Service: Radiology;  Laterality: N/A;  . ROBOT ASSISTED LAPAROSCOPIC RADICAL PROSTATECTOMY  01/31/2011   Robotic-assisted laparoscopic radical retropubic    Current Medications: Current Meds  Medication Sig  . cloNIDine (CATAPRES) 0.1 MG tablet Take 0.1 mg by mouth daily.  . FEROSUL 325 (65 Fe) MG tablet TAKE 1 TABLET EVERY DAY (Patient taking differently: Take 325 mg by mouth daily with breakfast. )  . folic acid (FOLVITE) 1 MG tablet Take 1 tablet (1 mg total) by mouth daily.  . furosemide (LASIX) 40 MG tablet Take 1 tablet (40 mg total) by mouth 2 (two) times daily.  . hydrOXYzine (ATARAX/VISTARIL) 50 MG tablet TAKE 1/2-2 TABLETS BY MOUTH AT BEDTIME AS NEEDED FOR ANXIETY (INSOMNIA).  Marland Kitchen isosorbide-hydrALAZINE (BIDIL) 20-37.5 MG tablet Take 2 tablets by  mouth 2 (two) times daily.   Marland Kitchen JANUVIA 100 MG tablet TAKE 1 TABLET BY MOUTH EVERY DAY (Patient taking differently: Take 100 mg by mouth daily. )  . labetalol (NORMODYNE) 200 MG tablet Take 1 tablet (200 mg total) by mouth 2 (two) times daily.  . Multiple Vitamin (MULTIVITAMIN WITH MINERALS) TABS tablet Take 1 tablet by mouth daily.  . Rivaroxaban (XARELTO) 15 MG TABS tablet Take 1 tablet (15 mg total) by mouth daily with supper.  . thiamine 100 MG tablet Take 1 tablet (100 mg total) by mouth daily.  Marland Kitchen triamcinolone cream (KENALOG) 0.1 % Apply topically 2 (two) times daily. (Patient taking differently: Apply 1 application topically 2 (two) times daily as needed (rash). )  . Vitamin D, Ergocalciferol, (DRISDOL) 1.25 MG (50000 UT) CAPS capsule Take 1 capsule (50,000 Units total) by mouth every Wednesday.  . [DISCONTINUED] diltiazem (TIAZAC) 120 MG 24 hr capsule Take 2 capsules (240 mg total) by mouth daily.  . [DISCONTINUED] diltiazem (TIAZAC) 240 MG 24 hr capsule Take 1 tablet by mouth daily.  . [DISCONTINUED] labetalol (NORMODYNE) 200 MG tablet Take 2 tablets (400 mg total) by  mouth 2 (two) times daily. (Patient taking differently: Take 200 mg by mouth daily. )  . [DISCONTINUED] labetalol (NORMODYNE) 200 MG tablet Take by mouth.   . [DISCONTINUED] pantoprazole (PROTONIX) 40 MG tablet Take 1 tablet (40 mg total) by mouth 2 (two) times daily.  . [DISCONTINUED] zolpidem (AMBIEN) 10 MG tablet Take 1 tablet (10 mg total) by mouth at bedtime as needed. for sleep     Allergies:   Hydrochlorothiazide, Lasix [furosemide], Nsaids, and Sulfa antibiotics   Social History   Socioeconomic History  . Marital status: Married    Spouse name: Not on file  . Number of children: 2  . Years of education: Not on file  . Highest education level: Not on file  Occupational History  . Occupation: TEFL teacher  Tobacco Use  . Smoking status: Former Smoker    Packs/day: 2.00    Years: 25.00    Pack years: 50.00    Types: Cigarettes    Quit date: 04/14/2014    Years since quitting: 5.9  . Smokeless tobacco: Never Used  Vaping Use  . Vaping Use: Never used  Substance and Sexual Activity  . Alcohol use: Yes    Alcohol/week: 7.0 - 10.0 standard drinks    Types: 7 - 10 Standard drinks or equivalent per week    Comment: 2 bottles of wine with wife a week; cocktail every other night  . Drug use: No  . Sexual activity: Not Currently  Other Topics Concern  . Not on file  Social History Narrative   Fun/Hobby: Walk, cards, gardening    Social Determinants of Health   Financial Resource Strain:   . Difficulty of Paying Living Expenses: Not on file  Food Insecurity:   . Worried About Charity fundraiser in the Last Year: Not on file  . Ran Out of Food in the Last Year: Not on file  Transportation Needs:   . Lack of Transportation (Medical): Not on file  . Lack of Transportation (Non-Medical): Not on file  Physical Activity:   . Days of Exercise per Week: Not on file  . Minutes of Exercise per Session: Not on file  Stress:   . Feeling of Stress : Not on file    Social Connections:   . Frequency of Communication with Friends and Family: Not on  file  . Frequency of Social Gatherings with Friends and Family: Not on file  . Attends Religious Services: Not on file  . Active Member of Clubs or Organizations: Not on file  . Attends Archivist Meetings: Not on file  . Marital Status: Not on file     Family History: The patient's family history includes Atrial fibrillation in his father; Breast cancer in his mother; Colon polyps in his brother; Leukemia in his brother; Lung cancer in his mother; Prostate cancer in his father. There is no history of Colon cancer or Diabetes.  ROS:   Please see the history of present illness.     All other systems reviewed and are negative.  EKGs/Labs/Other Studies Reviewed:    The following studies were reviewed today:  Echo 03/14/2020 1. Left ventricular ejection fraction, by estimation, is 55 to 60%. The  left ventricle has normal function. The left ventricle has no regional  wall motion abnormalities. There is moderate left ventricular hypertrophy.  Left ventricular diastolic  parameters are consistent with Grade III diastolic dysfunction  (restrictive). Elevated left atrial pressure.  2. Right ventricular systolic function is normal. The right ventricular  size is mildly enlarged. Tricuspid regurgitation signal is inadequate for  assessing PA pressure.  3. Left atrial size was moderately dilated.  4. Right atrial size was mild to moderately dilated.  5. The mitral valve is grossly normal. Mild mitral valve regurgitation.  No evidence of mitral stenosis.  6. The aortic valve is tricuspid. Aortic valve regurgitation is trivial.  No aortic stenosis is present.   EKG:  EKG is ordered today.  The ekg ordered today demonstrates sinus rhythm with bradycardia, heart rate in the 40s, first-degree AV block  Recent Labs: 06/29/2019: TSH 1.45 03/14/2020: B Natriuretic Peptide 895.2 03/15/2020:  Magnesium 1.8 03/16/2020: BUN 47; Creatinine, Ser 2.76; Hemoglobin 9.4; Platelets 258; Potassium 4.0; Sodium 140  Recent Lipid Panel    Component Value Date/Time   CHOL 113 05/28/2017 1107   TRIG 49.0 05/28/2017 1107   HDL 59.50 05/28/2017 1107   CHOLHDL 2 05/28/2017 1107   VLDL 9.8 05/28/2017 1107   LDLCALC 44 05/28/2017 1107    Physical Exam:    VS:  BP 121/63 (BP Location: Right Arm, Patient Position: Standing, Cuff Size: Normal)   Pulse (!) 45   Temp 98 F (36.7 C)   Ht _0  (1.753 m)   Wt 142 lb 6.4 oz (64.6 kg)   SpO2 98%   BMI 21.03 kg/m     Wt Readings from Last 3 Encounters:  04/06/20 142 lb 6.4 oz (64.6 kg)  03/22/20 158 lb 9.6 oz (71.9 kg)  03/16/20 160 lb (72.6 kg)     GEN:  Well nourished, well developed in no acute distress HEENT: Normal NECK: No JVD; No carotid bruits LYMPHATICS: No lymphadenopathy CARDIAC: RRR, no murmurs, rubs, gallops RESPIRATORY:  Clear to auscultation without rales, wheezing or rhonchi  ABDOMEN: Soft, non-tender, non-distended MUSCULOSKELETAL:  No edema; No deformity  SKIN: Warm and dry NEUROLOGIC:  Alert and oriented x 3 PSYCHIATRIC:  Normal affect   ASSESSMENT:    1. Bradycardia   2. Fatigue, unspecified type   3. PAF (paroxysmal atrial fibrillation) ([place])   4. Essential hypertension   5. CKD stage G4/A1, GFR 15-29 and albumin creatinine ratio <30 mg/g (HCC)   6. Controlled type 2 diabetes mellitus without complication, without long-term current use of insulin ([place])    PLAN:    In order of problems  listed above:  1. Bradycardia: Heart rate in the 40s, I recommended discontinue the 240 mg daily of diltiazem and switch to 120 mg daily instead.  He will start on the 120 mg daily of diltiazem this Friday.  2. Fatigue: Likely related to bradycardia.  3. PAF: On Xarelto and diltiazem.  Diltiazem dosage will be decreased due to bradycardia  4. Hypertension: Blood pressure stable  5. CKD stage IV: Stable on last lab  work  6. DM2: Managed by primary care provider   Medication Adjustments/Labs and Tests Ordered: Current medicines are reviewed at length with the patient today.  Concerns regarding medicines are outlined above.  Orders Placed This Encounter  Procedures  . EKG 12-Lead   Meds ordered this encounter  Medications  . DISCONTD: diltiazem (TIAZAC) 120 MG 24 hr capsule    Sig: Take 2 capsules (240 mg total) by mouth daily.    Dispense:  30 capsule    Refill:  6  . labetalol (NORMODYNE) 200 MG tablet    Sig: Take 1 tablet (200 mg total) by mouth 2 (two) times daily.    Dispense:  60 tablet    Refill:  6    Patient Instructions  Medication Instructions:  TAKE LABETALOL 200 TWICE DAILY  DILTIAZEM 120MG DAILY  MAKE SURE TO HOLD DILTIAZEM TOMORROW-THURSDAY  *If you need a refill on your cardiac medications before your next appointment, please call your pharmacy*  Follow-Up: Your next appointment:  2 week(s)   In Person with You may see Peter Martinique, MD Almyra Deforest, PA-C or one of the following Advanced Practice Providers on your designated Care Team:  Fabian Sharp, [place] or Roby Lofts, PA-C  At [facility], you and your health needs are our priority.  As part of our continuing mission to provide you with exceptional heart care, we have created designated Provider Care Teams.  These Care Teams include your primary Cardiologist (physician) and Advanced Practice Providers (APPs -  Physician Assistants and Nurse Practitioners) who all work together to provide you with the care you need, when you need it.     Hilbert Corrigan, [place]  04/08/2020 11:37 PM    [facility]

## 2020-04-06 NOTE — Patient Instructions (Signed)
Medication Instructions:  TAKE LABETALOL 200 TWICE DAILY  DILTIAZEM 120MG  DAILY  MAKE SURE TO HOLD DILTIAZEM TOMORROW-THURSDAY  *If you need a refill on your cardiac medications before your next appointment, please call your pharmacy*  Follow-Up: Your next appointment:  2 week(s)   In Person with You may see Peter Martinique, MD Almyra Deforest, PA-C or one of the following Advanced Practice Providers on your designated Care Team:  Fabian Sharp, [place] or Roby Lofts, PA-C  At [facility], you and your health needs are our priority.  As part of our continuing mission to provide you with exceptional heart care, we have created designated Provider Care Teams.  These Care Teams include your primary Cardiologist (physician) and Advanced Practice Providers (APPs -  Physician Assistants and Nurse Practitioners) who all work together to provide you with the care you need, when you need it.

## 2020-04-07 ENCOUNTER — Other Ambulatory Visit: Payer: Self-pay | Admitting: *Deleted

## 2020-04-07 ENCOUNTER — Telehealth: Payer: Self-pay | Admitting: *Deleted

## 2020-04-07 DIAGNOSIS — F5101 Primary insomnia: Secondary | ICD-10-CM

## 2020-04-07 MED ORDER — ZOLPIDEM TARTRATE 10 MG PO TABS: 10.0000 mg | ORAL_TABLET | Freq: Every evening | ORAL | 5 refills | Status: DC | PRN

## 2020-04-07 MED ORDER — PANTOPRAZOLE SODIUM 40 MG PO TBEC: 40.0000 mg | DELAYED_RELEASE_TABLET | Freq: Two times a day (BID) | ORAL | 3 refills | Status: DC

## 2020-04-07 NOTE — Telephone Encounter (Signed)
Rx Pantoprazole was resend to [facility]

## 2020-04-07 NOTE — Telephone Encounter (Signed)
Rx send to [facility]

## 2020-04-07 NOTE — Telephone Encounter (Signed)
Patient called regarding sending his RX to [facility] and requested a call back to figure out what is going on with this issue.

## 2020-04-07 NOTE — Telephone Encounter (Signed)
Pt requesting Rx Ambien send to [facility] to cancelled Rx

## 2020-04-08 ENCOUNTER — Encounter: Payer: Self-pay | Admitting: Physician Assistant

## 2020-04-08 MED ORDER — DILTIAZEM HCL ER BEADS 120 MG PO CP24: 120.0000 mg | ORAL_CAPSULE | Freq: Every day | ORAL | 6 refills | Status: DC

## 2020-04-12 DIAGNOSIS — N184 Chronic kidney disease, stage 4 (severe): Secondary | ICD-10-CM | POA: Diagnosis not present

## 2020-04-12 LAB — BASIC METABOLIC PANEL
BUN: 73 — AB (ref 4–21)
CO2: 27 — AB (ref 13–22)
Chloride: 102 (ref 99–108)
Creatinine: 4.8 — AB (ref <=1.3)
Glucose: 208
Potassium: 4 (ref 3.4–5.3)
Sodium: 138 (ref 137–147)

## 2020-04-12 LAB — COMPREHENSIVE METABOLIC PANEL
Albumin: 3.7 (ref 3.5–5.0)
Calcium: 8.3 — AB (ref 8.7–10.7)
GFR calc non Af Amer: 12

## 2020-04-12 LAB — CBC AND DIFFERENTIAL
HCT: 28 — AB (ref 41–53)
Hemoglobin: 9.8 — AB (ref 13.5–17.5)
Platelets: 224 (ref 150–399)
WBC: 6.4

## 2020-04-12 LAB — CBC: RBC: 3 — AB (ref 3.87–5.11)

## 2020-04-14 ENCOUNTER — Other Ambulatory Visit: Payer: Self-pay

## 2020-04-14 ENCOUNTER — Ambulatory Visit: Payer: Medicare HMO | Admitting: Dermatology

## 2020-04-14 ENCOUNTER — Encounter: Payer: Self-pay | Admitting: Dermatology

## 2020-04-14 DIAGNOSIS — Z86018 Personal history of other benign neoplasm: Secondary | ICD-10-CM | POA: Diagnosis not present

## 2020-04-14 DIAGNOSIS — N179 Acute kidney failure, unspecified: Secondary | ICD-10-CM | POA: Diagnosis not present

## 2020-04-14 DIAGNOSIS — I5032 Chronic diastolic (congestive) heart failure: Secondary | ICD-10-CM | POA: Diagnosis not present

## 2020-04-14 DIAGNOSIS — Z87891 Personal history of nicotine dependence: Secondary | ICD-10-CM | POA: Diagnosis not present

## 2020-04-14 DIAGNOSIS — L821 Other seborrheic keratosis: Secondary | ICD-10-CM

## 2020-04-14 DIAGNOSIS — D631 Anemia in chronic kidney disease: Secondary | ICD-10-CM | POA: Diagnosis not present

## 2020-04-14 DIAGNOSIS — E1122 Type 2 diabetes mellitus with diabetic chronic kidney disease: Secondary | ICD-10-CM | POA: Diagnosis not present

## 2020-04-14 DIAGNOSIS — I130 Hypertensive heart and chronic kidney disease with heart failure and stage 1 through stage 4 chronic kidney disease, or unspecified chronic kidney disease: Secondary | ICD-10-CM | POA: Diagnosis not present

## 2020-04-14 DIAGNOSIS — N184 Chronic kidney disease, stage 4 (severe): Secondary | ICD-10-CM | POA: Diagnosis not present

## 2020-04-18 ENCOUNTER — Encounter: Payer: Self-pay | Admitting: Dermatology

## 2020-04-18 NOTE — Progress Notes (Signed)
   Follow-Up Visit   Subjective  Douglas Ballard is a 69 y.o. male who presents for the following: Follow-up (6 mth- f/u- right cheek- dark and scaly).  New growth Location: Right cheek duration:  Quality:  Associated Signs/Symptoms: Modifying Factors:  Severity:  Timing: Context: History of multiple atypical moles  Objective  Well appearing patient in no apparent distress; mood and affect are within normal limits.  A focused examination was performed including Head and neck.. Relevant physical exam findings are noted in the Assessment and Plan.   Assessment & Plan    Seborrheic keratosis Head - Anterior (Face)  Leave if stable     I, Lavonna Monarch, MD, have reviewed all documentation for this visit.  The documentation on 04/18/20 for the exam, diagnosis, procedures, and orders are all accurate and complete.

## 2020-04-18 NOTE — Addendum Note (Signed)
Addended by: Warren Danes on: 04/18/2020 05:47 PM   Modules accepted: Orders

## 2020-04-27 ENCOUNTER — Ambulatory Visit: Payer: Medicare HMO | Admitting: Physician Assistant

## 2020-05-06 ENCOUNTER — Other Ambulatory Visit: Payer: Self-pay | Admitting: Family Medicine

## 2020-05-09 ENCOUNTER — Other Ambulatory Visit: Payer: Self-pay

## 2020-05-09 MED ORDER — DILTIAZEM HCL ER BEADS 120 MG PO CP24: 120.0000 mg | ORAL_CAPSULE | Freq: Every day | ORAL | 6 refills | Status: DC

## 2020-05-11 ENCOUNTER — Other Ambulatory Visit: Payer: Self-pay

## 2020-05-11 ENCOUNTER — Encounter: Payer: Self-pay | Admitting: Physician Assistant

## 2020-05-11 ENCOUNTER — Ambulatory Visit: Payer: Medicare HMO | Admitting: Physician Assistant

## 2020-05-11 ENCOUNTER — Other Ambulatory Visit: Payer: Self-pay | Admitting: Physician Assistant

## 2020-05-11 VITALS — BP 130/62 | HR 58 | Ht 69.0 in | Wt 159.0 lb

## 2020-05-11 DIAGNOSIS — I480 Paroxysmal atrial fibrillation: Secondary | ICD-10-CM | POA: Diagnosis not present

## 2020-05-11 DIAGNOSIS — E119 Type 2 diabetes mellitus without complications: Secondary | ICD-10-CM | POA: Diagnosis not present

## 2020-05-11 DIAGNOSIS — R001 Bradycardia, unspecified: Secondary | ICD-10-CM

## 2020-05-11 DIAGNOSIS — N289 Disorder of kidney and ureter, unspecified: Secondary | ICD-10-CM

## 2020-05-11 DIAGNOSIS — N189 Chronic kidney disease, unspecified: Secondary | ICD-10-CM | POA: Diagnosis not present

## 2020-05-11 DIAGNOSIS — I10 Essential (primary) hypertension: Secondary | ICD-10-CM | POA: Diagnosis not present

## 2020-05-11 NOTE — Progress Notes (Signed)
Cardiology Office Note:    Date:  05/13/2020   ID:  Douglas Ballard, DOB 12/18/1950, MRN [id number]  PCP:  Douglas Barrack, MD  [facility] Cardiologist:  Douglas Martinique, MD  [facility] Electrophysiologist:  None   Referring MD: Douglas Barrack, MD   Chief Complaint  Patient presents with  . Follow-up    seen for Dr. Martinique    History of Present Illness:    Douglas Ballard is a 69 y.o. male with a hx of HTN, PAF, CKD, tobacco abuse, alcohol abuse, DM 2,andcolovesical fistula. He was admitted in August 2015 with acute renal failure, sepsis, expressive aphasia, diastolic heart failure in the setting of A. fib with RVR. He also had alcohol withdrawal during the same hospitalization. Echocardiogram demonstrated normal EF, moderate biatrial enlargement, mild LVH. Myoview demonstrated normal perfusion. He had recurrent atrial fibrillation in June 2017. He was seen in the ED in June 2017 with chest pain and hematuria. D-dimer was negative, cardiac enzymes were negative. Repeat Myoview showed normal perfusion, EF 50%.  He was admitted for acute on chronic diastolic heart failure in January 2020 and underwent IV diuresis.  Creatinine trended up to 3.3 before trending back down.Echocardiogram obtained in 2020 showed EF 55 to 60%.  More recently, patient was admitted to the hospital in July 2021 with acute on chronic diastolic heart failure.  Echocardiogram obtained on 03/14/2020 showed EF 55 to 36%, grade 3 diastolic dysfunction, moderate LAE, mild to moderate right atrial enlargement, mild MR.  Due to heme positive stool, Xarelto was held for a few days before resuming.  During the meantime, he underwent endoscopy and colonoscopy by GI service.  I last saw the patient on 04/06/2020 for evaluation of fatigue, his heart rate was in the 40s.  I reduced his diltiazem from 240 mg daily to 120 mg daily.  He returns today for follow-up.  Since the last visit, his blood work at his nephrologist  office shows acute on chronic renal insufficiency with creatinine trended up to greater than 4.  His nephrologist felt that this is likely prerenal AKI, and reduced his Lasix to 40 mg daily instead.  He was supposed to follow-up with nephrology service again in 1 month.    He did mention that immediately after stopping the diltiazem, he felt better and fatigue is gone.  Unfortunately there has been some mixup about his medication, he ran out of the 120 mg of diltiazem and ended up taking a single dose of 240 mg diltiazem this past Monday.  He felt very poorly afterward.  He has not taken any diltiazem for the past 2 days.  Heart rate today is in the 50s.  I have called her [place] and his local [facility], I make sure they are aware he should be on 120 mg of diltiazem not 240 mg.  Patient was quite irritated by the recent myself, I did apologize for the mistake and tried to resolve the issue in the best of my ability.   Past Medical History:  Diagnosis Date  . Alcohol withdrawal seizure ([place]) 06/28/2011   Alcohol withdraw seizure prior to admission is suspected from history given by family & Post ictal appearance in the ED.   Marland Kitchen Anxiety   . Atrial fibrillation ([place])   . Atypical mole 05/13/2008   LEFT MEDIAL CHEST SLIGHT/MOD.  Marland Kitchen Atypical nevi 05/13/2008   LEFT LATERAL CHEST SLIGHT  . Atypical nevi 04/29/2015   RIGHT POST SHOULDER MOD/SEV. TX W/S  .  Atypical nevi 04/29/2015   RIGHT MID ABDOMEN MODERATE TX W/S  . Atypical nevi 09/27/2015   LEFT UPPER ARM SEVERE TX W/S  . Atypical nevi 09/27/2015   MID LOWER BACK MODERATE  . Atypical nevi 09/27/2015   MID UPPER BACK SEVERE TX EXC  . Atypical nevi 09/27/2015   LEFT UPPER BACK MOD.SEV TX EXC  . Atypical nevi 03/23/2016   RIGHT LOWER BACK MILD  . Atypical nevi 03/23/2016   LEFT SIDE TORSO MILD  . Atypical nevi 06/14/2016   LEFT LOWER BACK MOD/SEV   . Atypical nevi 06/14/2016   LEFT CHEST MODERATE  . Atypical nevi 08/30/2017    MID UPPER BACK SUP MODERATE  . Atypical nevi 08/30/2017   RIGHT CHEST SEVERE   . Atypical nevi 07/25/2018   LEFT SHOULDER ANTERIOR TX WIDERSHAVE  . Atypical nevi 07/25/2018   LEFT SHOULDER POST MODERATE  . Atypical nevi 08/25/2019   LEFT SIDE ABDOMEN MODERATE FREE  . Atypical nevi 11/27/2019   RIGHT SHOULDER MODERATE TX W/S  . Atypical nevi 11/27/2019   RIGHT UPPER BACK MOD/SEVERE TX W/S  . Atypical nevi 11/27/2019   LEFT MID BACK SEVERE TX W/S  . Cancer [facility]) 2012   Prostate surgery  . Chronic diastolic CHF (congestive heart failure) ([place]) 11/01/2014   Echo 8/15: Mild LVH, EF 50-55%, moderate BAE  //  b. Echo 7/17: EF 55-60%, normal wall motion, grade 2 diastolic dysfunction, MAC, mild MR, moderate LAE, trivial PI  . Chronic kidney disease   . Compression fracture of thoracic vertebra ([place]) 06/26/2011  . Diabetes mellitus type 2 in nonobese (HCC)   . Fistula, bladder   . Frequent urination at night   . History of adenomatous polyp of colon 06/14/2014  . History of nuclear stress test    a. Myoview 10/15: Overall Impression: Low risk stress nuclear study demonstrating mild baseline ST-T changes with normal myocardial perfusion and low normal EF of 50%. // b.Myoview 7/17: EF 50%, no ischemia or scar, low risk (EF normal by recent echo)  . Hypertension   . PNA (pneumonia) 06/26/2011  . Sepsis due to urinary tract infection ([place]) 04/16/2014  . Stroke ([place]) sept 1, 2015   tia x 3    Past Surgical History:  Procedure Laterality Date  . BIOPSY  03/16/2020   Procedure: BIOPSY;  Surgeon: Lavena Bullion, DO;  Location: [facility];  Service: Gastroenterology;;  . COLON SURGERY    . COLONOSCOPY N/A 06/14/2014   Procedure: COLONOSCOPY;  Surgeon: Gatha Mayer, MD;  Location: [facility];  Service: Endoscopy;  Laterality: N/A;  . COLONOSCOPY WITH PROPOFOL N/A 03/16/2020   Procedure: COLONOSCOPY WITH PROPOFOL;  Surgeon: Lavena Bullion, DO;  Location: [facility];  Service:  Gastroenterology;  Laterality: N/A;  . CYSTOSCOPY WITH STENT PLACEMENT Bilateral 06/15/2014   Procedure: CYSTOSCOPY WITH BILATERAL STENT PLACEMENT;  Surgeon: Bernestine Amass, MD;  Location: [facility];  Service: Urology;  Laterality: Bilateral;  . ESOPHAGOGASTRODUODENOSCOPY (EGD) WITH PROPOFOL N/A 03/16/2020   Procedure: ESOPHAGOGASTRODUODENOSCOPY (EGD) WITH PROPOFOL;  Surgeon: Lavena Bullion, DO;  [facility];  Service: Gastroenterology;  Laterality: N/A;  . LIPOMA EXCISION  2012   Dr Nonah Mattes  . moles removed     Back  . POLYPECTOMY  03/16/2020   Procedure: POLYPECTOMY;  Surgeon: Lavena Bullion, DO;  [facility]: [facility];  Service: Gastroenterology;;  . PROCTOSCOPY N/A 06/15/2014   Procedure: RIGID PROCTOSCOPY;  Surgeon: Michael Boston, MD;  Location: [facility];  Service:  General;  Laterality: N/A;  . RADIOLOGY WITH ANESTHESIA N/A 07/28/2014   Procedure: Embolization;  Surgeon: Luanne Bras, MD;  Location: [facility];  Service: Radiology;  Laterality: N/A;  . ROBOT ASSISTED LAPAROSCOPIC RADICAL PROSTATECTOMY  01/31/2011   Robotic-assisted laparoscopic radical retropubic    Current Medications: Current Meds  Medication Sig  . cloNIDine (CATAPRES) 0.1 MG tablet Take 0.1 mg by mouth daily.  Marland Kitchen diltiazem (TIAZAC) 120 MG 24 hr capsule Take 1 capsule (120 mg total) by mouth daily.  Marland Kitchen DM-Doxylamine-Acetaminophen (NYQUIL HBP COLD & FLU) 15-6.25-325 MG/15ML LIQD Take 30 mLs by mouth at bedtime as needed (cough/sleep).   . FEROSUL 325 (65 Fe) MG tablet TAKE 1 TABLET EVERY DAY (Patient taking differently: Take 325 mg by mouth daily with breakfast. )  . folic acid (FOLVITE) 1 MG tablet Take 1 tablet (1 mg total) by mouth daily.  . furosemide (LASIX) 40 MG tablet Take 1 tablet (40 mg total) by mouth 2 (two) times daily. (Patient taking differently: Take 40 mg by mouth daily. )  . hydrOXYzine (ATARAX/VISTARIL) 50 MG tablet TAKE 1/2-2 TABLETS BY MOUTH AT BEDTIME AS NEEDED FOR ANXIETY  (INSOMNIA).  Marland Kitchen isosorbide-hydrALAZINE (BIDIL) 20-37.5 MG tablet Take 2 tablets by mouth 2 (two) times daily.   Marland Kitchen JANUVIA 100 MG tablet TAKE 1 TABLET (100 MG TOTAL) BY MOUTH DAILY.  Marland Kitchen labetalol (NORMODYNE) 200 MG tablet Take 1 tablet (200 mg total) by mouth 2 (two) times daily.  . Multiple Vitamin (MULTIVITAMIN WITH MINERALS) TABS tablet Take 1 tablet by mouth daily.  . pantoprazole (PROTONIX) 40 MG tablet Take 1 tablet (40 mg total) by mouth 2 (two) times daily.  . Rivaroxaban (XARELTO) 15 MG TABS tablet Take 1 tablet (15 mg total) by mouth daily with supper.  . thiamine 100 MG tablet Take 1 tablet (100 mg total) by mouth daily.  Marland Kitchen triamcinolone cream (KENALOG) 0.1 % Apply topically 2 (two) times daily. (Patient taking differently: Apply 1 application topically 2 (two) times daily as needed (rash). )  . Vitamin D, Ergocalciferol, (DRISDOL) 1.25 MG (50000 UT) CAPS capsule Take 1 capsule (50,000 Units total) by mouth every Wednesday.  . zolpidem (AMBIEN) 10 MG tablet Take 1 tablet (10 mg total) by mouth at bedtime as needed. for sleep     Allergies:   Hydrochlorothiazide, Lasix [furosemide], Nsaids, and Sulfa antibiotics   Social History   Socioeconomic History  . Marital status: Married    Spouse name: Not on file  . Number of children: 2  . Years of education: Not on file  . Highest education level: Not on file  Occupational History  . Occupation: TEFL teacher  Tobacco Use  . Smoking status: Former Smoker    Packs/day: 2.00    Years: 25.00    Pack years: 50.00    Types: Cigarettes    Quit date: 04/14/2014    Years since quitting: 6.0  . Smokeless tobacco: Never Used  Vaping Use  . Vaping Use: Never used  Substance and Sexual Activity  . Alcohol use: Yes    Alcohol/week: 7.0 - 10.0 standard drinks    Types: 7 - 10 Standard drinks or equivalent per week    Comment: 2 bottles of wine with wife a week; cocktail every other night  . Drug use: No  . Sexual activity:  Not Currently  Other Topics Concern  . Not on file  Social History Narrative   Fun/Hobby: Walk, cards, gardening    Social Determinants of Health  Financial Resource Strain:   . Difficulty of Paying Living Expenses: Not on file  Food Insecurity:   . Worried About Charity fundraiser in the Last Year: Not on file  . Ran Out of Food in the Last Year: Not on file  Transportation Needs:   . Lack of Transportation (Medical): Not on file  . Lack of Transportation (Non-Medical): Not on file  Physical Activity:   . Days of Exercise per Week: Not on file  . Minutes of Exercise per Session: Not on file  Stress:   . Feeling of Stress : Not on file  Social Connections:   . Frequency of Communication with Friends and Family: Not on file  . Frequency of Social Gatherings with Friends and Family: Not on file  . Attends Religious Services: Not on file  . Active Member of Clubs or Organizations: Not on file  . Attends Archivist Meetings: Not on file  . Marital Status: Not on file     Family History: The patient's family history includes Atrial fibrillation in his father; Breast cancer in his mother; Colon polyps in his brother; Leukemia in his brother; Lung cancer in his mother; Prostate cancer in his father. There is no history of Colon cancer or Diabetes.  ROS:   Please see the history of present illness.     All other systems reviewed and are negative.  EKGs/Labs/Other Studies Reviewed:    The following studies were reviewed today:  Echo 03/14/2020 1. Left ventricular ejection fraction, by estimation, is 55 to 60%. The  left ventricle has normal function. The left ventricle has no regional  wall motion abnormalities. There is moderate left ventricular hypertrophy.  Left ventricular diastolic  parameters are consistent with Grade III diastolic dysfunction  (restrictive). Elevated left atrial pressure.  2. Right ventricular systolic function is normal. The right  ventricular  size is mildly enlarged. Tricuspid regurgitation signal is inadequate for  assessing PA pressure.  3. Left atrial size was moderately dilated.  4. Right atrial size was mild to moderately dilated.  5. The mitral valve is grossly normal. Mild mitral valve regurgitation.  No evidence of mitral stenosis.  6. The aortic valve is tricuspid. Aortic valve regurgitation is trivial.  No aortic stenosis is present.   Comparison(s): No significant change from prior study.   EKG:  EKG is ordered today.  The ekg ordered today demonstrates sinus bradycardia, heart rate 58, no significant ST-T wave changes.  Recent Labs: 06/29/2019: TSH 1.45 03/14/2020: B Natriuretic Peptide 895.2 03/15/2020: Magnesium 1.8 03/16/2020: BUN 47; Creatinine, Ser 2.76; Hemoglobin 9.4; Platelets 258; Potassium 4.0; Sodium 140  Recent Lipid Panel    Component Value Date/Time   CHOL 113 05/28/2017 1107   TRIG 49.0 05/28/2017 1107   HDL 59.50 05/28/2017 1107   CHOLHDL 2 05/28/2017 1107   VLDL 9.8 05/28/2017 1107   LDLCALC 44 05/28/2017 1107    Physical Exam:    VS:  BP 130/62   Pulse (!) 58   Ht _0  (1.753 m)   Wt 159 lb (72.1 kg)   BMI 23.48 kg/m     Wt Readings from Last 3 Encounters:  05/11/20 159 lb (72.1 kg)  04/06/20 142 lb 6.4 oz (64.6 kg)  03/22/20 158 lb 9.6 oz (71.9 kg)     GEN:  Well nourished, well developed in no acute distress HEENT: Normal NECK: No JVD; No carotid bruits LYMPHATICS: No lymphadenopathy CARDIAC: RRR, no murmurs, rubs, gallops RESPIRATORY:  Clear to  auscultation without rales, wheezing or rhonchi  ABDOMEN: Soft, non-tender, non-distended MUSCULOSKELETAL:  No edema; No deformity  SKIN: Warm and dry NEUROLOGIC:  Alert and oriented x 3 PSYCHIATRIC:  Normal affect   ASSESSMENT:    1. Bradycardia   2. Acute on chronic renal insufficiency   3. PAF (paroxysmal atrial fibrillation) ([place])   4. Essential hypertension   5. Controlled type 2 diabetes mellitus  without complication, without long-term current use of insulin (HCC)    PLAN:    In order of problems listed above:  1. Bradycardia: I recently reduced his diltiazem to 120 mg daily.  According to the patient, his fatigue has significantly improved after reducing the medication.  Unfortunately there was a mixup of his prescription, he ran out of 120 mg daily of diltiazem and ended up taking a 240 mg recently.  I have called his local pharmacy and [facility] mail in pharmacy and corrected to issue.  2. Acute on chronic renal insufficiency: Recent lab work shows his creatinine went up to greater than 4, his nephrologist has reduced his Lasix from 40 mg twice a day down to 40 mg daily.  Reducing the diltiazem should also help with cardiac output as well.  He will need repeat lab work by nephrology service.  I also ordered a basic metabolic panel just in case he cannot get a closer visit with his nephrologist.  3. PAF: He is on low-dose Xarelto.  Rate controlled on labetalol and the diltiazem  4. Hypertension: Blood pressure stable on current therapy.  5. DM2: Managed by primary care provider   Medication Adjustments/Labs and Tests Ordered: Current medicines are reviewed at length with the patient today.  Concerns regarding medicines are outlined above.  Orders Placed This Encounter  Procedures  . Basic metabolic panel  . EKG 12-Lead   No orders of the defined types were placed in this encounter.   Patient Instructions  Medication Instructions:  Take your Diltiazem 120 mg daily  *If you need a refill on your cardiac medications before your next appointment, please call your pharmacy*  Lab Work: Your physician recommends that you return for lab work in: 2 weeks   BMET If you have labs (blood work) drawn today and your tests are completely normal, you will receive your results only by: Marland Kitchen MyChart Message (if you have MyChart) OR . A paper copy in the mail If you have any lab test that is  abnormal or we need to change your treatment, we will call you to review the results.  Testing/Procedures: NONE ordered at this time of appointment   Follow-Up: At [facility], you and your health needs are our priority.  As part of our continuing mission to provide you with exceptional heart care, we have created designated Provider Care Teams.  These Care Teams include your primary Cardiologist (physician) and Advanced Practice Providers (APPs -  Physician Assistants and Nurse Practitioners) who all work together to provide you with the care you need, when you need it.   Your next appointment:   3 month(s)  The format for your next appointment:   In Person  Provider:   Peter Martinique, MD  Other Instructions      Signed, Almyra Deforest, [place]  05/13/2020 6:21 PM    [place]

## 2020-05-11 NOTE — Patient Instructions (Signed)
Medication Instructions:  Take your Diltiazem 120 mg daily  *If you need a refill on your cardiac medications before your next appointment, please call your pharmacy*  Lab Work: Your physician recommends that you return for lab work in: 2 weeks   BMET If you have labs (blood work) drawn today and your tests are completely normal, you will receive your results only by: Marland Kitchen MyChart Message (if you have MyChart) OR . A paper copy in the mail If you have any lab test that is abnormal or we need to change your treatment, we will call you to review the results.  Testing/Procedures: NONE ordered at this time of appointment   Follow-Up: At [facility], you and your health needs are our priority.  As part of our continuing mission to provide you with exceptional heart care, we have created designated Provider Care Teams.  These Care Teams include your primary Cardiologist (physician) and Advanced Practice Providers (APPs -  Physician Assistants and Nurse Practitioners) who all work together to provide you with the care you need, when you need it.   Your next appointment:   3 month(s)  The format for your next appointment:   In Person  Provider:   Peter Martinique, MD  Other Instructions

## 2020-05-13 ENCOUNTER — Encounter: Payer: Self-pay | Admitting: Physician Assistant

## 2020-05-19 ENCOUNTER — Encounter: Payer: Self-pay | Admitting: Family Medicine

## 2020-05-23 ENCOUNTER — Other Ambulatory Visit: Payer: Self-pay

## 2020-05-23 NOTE — Telephone Encounter (Signed)
MEDICATION: Folic Acid 1 MG  PHARMACY: [facility]   Comments:   **Let patient know to contact pharmacy at the end of the day to make sure medication is ready. **  ** Please notify patient to allow 48-72 hours to process**  **Encourage patient to contact the pharmacy for refills or they can request refills through [facility]**

## 2020-05-24 MED ORDER — FOLIC ACID 1 MG PO TABS: 1.0000 mg | ORAL_TABLET | Freq: Every day | ORAL | 0 refills | Status: DC

## 2020-05-24 NOTE — Telephone Encounter (Signed)
LAST APPOINTMENT DATE: 05/19/2020   NEXT APPOINTMENT DATE: Visit date not found    LAST REFILL: 0730/2021  QTY:30  Refill by Dr Nance Pew

## 2020-05-25 ENCOUNTER — Telehealth: Payer: Self-pay | Admitting: *Deleted

## 2020-05-25 ENCOUNTER — Other Ambulatory Visit: Payer: Self-pay | Admitting: *Deleted

## 2020-05-25 MED ORDER — FERROUS SULFATE 325 (65 FE) MG PO TABS: 325.0000 mg | ORAL_TABLET | Freq: Every day | ORAL | 0 refills | Status: DC

## 2020-05-25 NOTE — Telephone Encounter (Signed)
[facility] pharmacy requesting Rx Conidine   LAST APPOINTMENT DATE: 05/23/2020  NEXT APPOINTMENT DATE: Visit date not found  LAST REFILL: 06/26/2017  Last refill by historical provider

## 2020-05-25 NOTE — Telephone Encounter (Signed)
Please verify - is this supposed to be clonidine?  [name]. [name], MD 05/25/2020 12:12 PM

## 2020-05-26 ENCOUNTER — Other Ambulatory Visit: Payer: Self-pay

## 2020-05-26 MED ORDER — CLONIDINE HCL 0.1 MG PO TABS: 0.1000 mg | ORAL_TABLET | Freq: Every day | ORAL | 2 refills | Status: DC

## 2020-05-26 NOTE — Telephone Encounter (Signed)
Ok with me. Please place any necessary orders. 

## 2020-05-26 NOTE — Telephone Encounter (Signed)
Medication refilled

## 2020-05-26 NOTE — Telephone Encounter (Signed)
Rx Clonidine HCL 0.1mg  [facility] pharmacy requesting refills

## 2020-05-31 ENCOUNTER — Ambulatory Visit: Payer: Medicare HMO | Admitting: Physician Assistant

## 2020-06-03 DIAGNOSIS — N184 Chronic kidney disease, stage 4 (severe): Secondary | ICD-10-CM | POA: Diagnosis not present

## 2020-06-03 LAB — COMPREHENSIVE METABOLIC PANEL
Albumin: 3.8 (ref 3.5–5.0)
Calcium: 8.1 — AB (ref 8.7–10.7)
GFR calc non Af Amer: 21

## 2020-06-03 LAB — BASIC METABOLIC PANEL
BUN: 38 — AB (ref 4–21)
CO2: 26 — AB (ref 13–22)
Chloride: 98 — AB (ref 99–108)
Creatinine: 2.9 — AB (ref 0.6–1.3)
Glucose: 127
Potassium: 4 (ref 3.4–5.3)
Sodium: 130 — AB (ref 137–147)

## 2020-06-03 LAB — CBC AND DIFFERENTIAL
HCT: 29 — AB (ref 41–53)
Hemoglobin: 9.9 — AB (ref 13.5–17.5)
Platelets: 228 (ref 150–399)
WBC: 6.5

## 2020-06-03 LAB — CBC: RBC: 3.1 — AB (ref 3.87–5.11)

## 2020-06-04 LAB — BASIC METABOLIC PANEL
BUN: 38 — AB (ref 4–21)
CO2: 26 — AB (ref 13–22)
Chloride: 98 — AB (ref 99–108)
Creatinine: 2.9 — AB (ref 0.6–1.3)
Glucose: 127
Potassium: 4 (ref 3.4–5.3)
Sodium: 130 — AB (ref 137–147)

## 2020-06-04 LAB — COMPREHENSIVE METABOLIC PANEL
Albumin: 3.8 (ref 3.5–5.0)
Calcium: 8.1 — AB (ref 8.7–10.7)
GFR calc non Af Amer: 21

## 2020-06-04 LAB — CBC AND DIFFERENTIAL
HCT: 29 — AB (ref 41–53)
Hemoglobin: 9.9 — AB (ref 13.5–17.5)
Platelets: 228 (ref 150–399)
WBC: 6.5

## 2020-06-04 LAB — CBC: RBC: 3.1 — AB (ref 3.87–5.11)

## 2020-06-07 DIAGNOSIS — I130 Hypertensive heart and chronic kidney disease with heart failure and stage 1 through stage 4 chronic kidney disease, or unspecified chronic kidney disease: Secondary | ICD-10-CM | POA: Diagnosis not present

## 2020-06-07 DIAGNOSIS — I5032 Chronic diastolic (congestive) heart failure: Secondary | ICD-10-CM | POA: Diagnosis not present

## 2020-06-07 DIAGNOSIS — E1122 Type 2 diabetes mellitus with diabetic chronic kidney disease: Secondary | ICD-10-CM | POA: Diagnosis not present

## 2020-06-07 DIAGNOSIS — E871 Hypo-osmolality and hyponatremia: Secondary | ICD-10-CM | POA: Diagnosis not present

## 2020-06-07 DIAGNOSIS — N179 Acute kidney failure, unspecified: Secondary | ICD-10-CM | POA: Diagnosis not present

## 2020-06-07 DIAGNOSIS — D631 Anemia in chronic kidney disease: Secondary | ICD-10-CM | POA: Diagnosis not present

## 2020-06-07 DIAGNOSIS — N184 Chronic kidney disease, stage 4 (severe): Secondary | ICD-10-CM | POA: Diagnosis not present

## 2020-06-28 ENCOUNTER — Encounter: Payer: Self-pay | Admitting: Family Medicine

## 2020-07-05 DIAGNOSIS — N184 Chronic kidney disease, stage 4 (severe): Secondary | ICD-10-CM | POA: Diagnosis not present

## 2020-07-05 DIAGNOSIS — D631 Anemia in chronic kidney disease: Secondary | ICD-10-CM | POA: Diagnosis not present

## 2020-07-12 ENCOUNTER — Telehealth: Payer: Self-pay

## 2020-07-12 NOTE — Telephone Encounter (Signed)
Douglas Ballard is calling in from [facility] requesting if we have received the fax about statin drug therapy alert, have faxed something over several times and has had no response.

## 2020-07-12 NOTE — Telephone Encounter (Signed)
Douglas Ballard can you check into this?

## 2020-07-17 ENCOUNTER — Other Ambulatory Visit: Payer: Self-pay | Admitting: Family Medicine

## 2020-07-18 NOTE — Telephone Encounter (Signed)
Form faxed

## 2020-07-19 ENCOUNTER — Ambulatory Visit: Payer: Medicare HMO | Admitting: Physician Assistant

## 2020-07-21 ENCOUNTER — Other Ambulatory Visit: Payer: Self-pay | Admitting: *Deleted

## 2020-07-21 MED ORDER — FOLIC ACID 1 MG PO TABS: 1.0000 mg | ORAL_TABLET | Freq: Every day | ORAL | 0 refills | Status: DC

## 2020-07-28 ENCOUNTER — Telehealth: Payer: Self-pay | Admitting: Family Medicine

## 2020-07-28 NOTE — Telephone Encounter (Signed)
Left message for patient to call back and schedule Medicare Annual Wellness Visit (AWV) either virtually OR in office.   Last AWV 06/29/19; please schedule at anytime with [facility]-Nurse Health Advisor at [place].  This should be a 45 minute visit.   

## 2020-08-05 DIAGNOSIS — D631 Anemia in chronic kidney disease: Secondary | ICD-10-CM | POA: Diagnosis not present

## 2020-08-05 DIAGNOSIS — N184 Chronic kidney disease, stage 4 (severe): Secondary | ICD-10-CM | POA: Diagnosis not present

## 2020-08-09 DIAGNOSIS — D631 Anemia in chronic kidney disease: Secondary | ICD-10-CM | POA: Diagnosis not present

## 2020-08-09 DIAGNOSIS — N184 Chronic kidney disease, stage 4 (severe): Secondary | ICD-10-CM | POA: Diagnosis not present

## 2020-08-10 ENCOUNTER — Other Ambulatory Visit: Payer: Self-pay | Admitting: Family Medicine

## 2020-08-23 NOTE — Progress Notes (Unsigned)
Cardiology Office Note:    Date:  08/26/2020   ID:  Douglas Ballard, DOB Sep 17, 1950, MRN [id number]  PCP:  Vivi Barrack, MD  [facility] Cardiologist:  Chancey Ringel Martinique, MD  [facility] Electrophysiologist:  None   Referring MD: Vivi Barrack, MD   Chief Complaint  Patient presents with  . Atrial Fibrillation  . Congestive Heart Failure    History of Present Illness:    Douglas Ballard is a 70 y.o. male with a hx of HTN, PAF, CKD, tobacco abuse, alcohol abuse, DM 2,andcolovesical fistula. He was admitted in August 2015 with acute renal failure, sepsis, expressive aphasia, diastolic heart failure in the setting of A. fib with RVR. He also had alcohol withdrawal during the same hospitalization. Echocardiogram demonstrated normal EF, moderate biatrial enlargement, mild LVH. Myoview demonstrated normal perfusion. He had recurrent atrial fibrillation in June 2017. He was seen in the ED in June 2017 with chest pain and hematuria. D-dimer was negative, cardiac enzymes were negative. Repeat Myoview showed normal perfusion, EF 50%.  He was admitted for acute on chronic diastolic heart failure in January 2020 and underwent IV diuresis.  Creatinine trended up to 3.3 before trending back down.Echocardiogram obtained in 2020 showed EF 55 to 60%.  He was admitted to the hospital in July 2021 with acute on chronic diastolic heart failure.  Echocardiogram obtained on 03/14/2020 showed EF 55 to 08%, grade 3 diastolic dysfunction, moderate LAE, mild to moderate right atrial enlargement, mild MR.  Due to heme positive stool, Xarelto was held for a few days before resuming.  During the meantime, he underwent endoscopy and colonoscopy by GI service.  When seen in the office he was bradycardic with HR in the 40s and diltiazem dose was reduced. He is followed by Nephrology. On last visit here creatinine was up to 4.76. With reduction of lasix to 40 mg daily creatinine improved to 3.17.   On follow up  today he is doing very well. Denies any dyspnea, palpitations, dizziness, edema, orthopnea. Weight is down. He hasn't smoked in years and states his Etoh use is "way down".     Past Medical History:  Diagnosis Date  . Alcohol withdrawal seizure ([place]) 06/28/2011   Alcohol withdraw seizure prior to admission is suspected from history given by family & Post ictal appearance in the ED.   Marland Kitchen Anxiety   . Atrial fibrillation ([place])   . Atypical mole 05/13/2008   LEFT MEDIAL CHEST SLIGHT/MOD.  Marland Kitchen Atypical nevi 05/13/2008   LEFT LATERAL CHEST SLIGHT  . Atypical nevi 04/29/2015   RIGHT POST SHOULDER MOD/SEV. TX W/S  . Atypical nevi 04/29/2015   RIGHT MID ABDOMEN MODERATE TX W/S  . Atypical nevi 09/27/2015   LEFT UPPER ARM SEVERE TX W/S  . Atypical nevi 09/27/2015   MID LOWER BACK MODERATE  . Atypical nevi 09/27/2015   MID UPPER BACK SEVERE TX EXC  . Atypical nevi 09/27/2015   LEFT UPPER BACK MOD.SEV TX EXC  . Atypical nevi 03/23/2016   RIGHT LOWER BACK MILD  . Atypical nevi 03/23/2016   LEFT SIDE TORSO MILD  . Atypical nevi 06/14/2016   LEFT LOWER BACK MOD/SEV   . Atypical nevi 06/14/2016   LEFT CHEST MODERATE  . Atypical nevi 08/30/2017   MID UPPER BACK SUP MODERATE  . Atypical nevi 08/30/2017   RIGHT CHEST SEVERE   . Atypical nevi 07/25/2018   LEFT SHOULDER ANTERIOR TX WIDERSHAVE  . Atypical nevi 07/25/2018   LEFT SHOULDER POST MODERATE  .  Atypical nevi 08/25/2019   LEFT SIDE ABDOMEN MODERATE FREE  . Atypical nevi 11/27/2019   RIGHT SHOULDER MODERATE TX W/S  . Atypical nevi 11/27/2019   RIGHT UPPER BACK MOD/SEVERE TX W/S  . Atypical nevi 11/27/2019   LEFT MID BACK SEVERE TX W/S  . Cancer [facility]) 2012   Prostate surgery  . Chronic diastolic CHF (congestive heart failure) ([place]) 11/01/2014   Echo 8/15: Mild LVH, EF 50-55%, moderate BAE  //  b. Echo 7/17: EF 55-60%, normal wall motion, grade 2 diastolic dysfunction, MAC, mild MR, moderate LAE, trivial PI  . Chronic kidney disease    . Compression fracture of thoracic vertebra ([place]) 06/26/2011  . Diabetes mellitus type 2 in nonobese (HCC)   . Fistula, bladder   . Frequent urination at night   . History of adenomatous polyp of colon 06/14/2014  . History of nuclear stress test    a. Myoview 10/15: Overall Impression: Low risk stress nuclear study demonstrating mild baseline ST-T changes with normal myocardial perfusion and low normal EF of 50%. // b.Myoview 7/17: EF 50%, no ischemia or scar, low risk (EF normal by recent echo)  . Hypertension   . PNA (pneumonia) 06/26/2011  . Sepsis due to urinary tract infection ([place]) 04/16/2014  . Stroke ([place]) sept 1, 2015   tia x 3    Past Surgical History:  Procedure Laterality Date  . BIOPSY  03/16/2020   Procedure: BIOPSY;  Surgeon: Lavena Bullion, DO;  Location: [facility];  Service: Gastroenterology;;  . COLON SURGERY    . COLONOSCOPY N/A 06/14/2014   Procedure: COLONOSCOPY;  Surgeon: Gatha Mayer, MD;  Location: [facility];  Service: Endoscopy;  Laterality: N/A;  . COLONOSCOPY WITH PROPOFOL N/A 03/16/2020   Procedure: COLONOSCOPY WITH PROPOFOL;  Surgeon: Lavena Bullion, DO;  Location: [facility];  Service: Gastroenterology;  Laterality: N/A;  . CYSTOSCOPY WITH STENT PLACEMENT Bilateral 06/15/2014   Procedure: CYSTOSCOPY WITH BILATERAL STENT PLACEMENT;  Surgeon: Bernestine Amass, MD;  Location: [facility];  Service: Urology;  Laterality: Bilateral;  . ESOPHAGOGASTRODUODENOSCOPY (EGD) WITH PROPOFOL N/A 03/16/2020   Procedure: ESOPHAGOGASTRODUODENOSCOPY (EGD) WITH PROPOFOL;  Surgeon: Lavena Bullion, DO;  Location: [facility];  Service: Gastroenterology;  Laterality: N/A;  . LIPOMA EXCISION  2012   Dr Nonah Mattes  . moles removed     Back  . POLYPECTOMY  03/16/2020   Procedure: POLYPECTOMY;  Surgeon: Lavena Bullion, DO;  Location: [facility];  Service: Gastroenterology;;  . PROCTOSCOPY N/A 06/15/2014   Procedure: RIGID PROCTOSCOPY;  Surgeon: Michael Boston,  MD;  Location: [facility];  Service: General;  Laterality: N/A;  . RADIOLOGY WITH ANESTHESIA N/A 07/28/2014   Procedure: Embolization;  Surgeon: Luanne Bras, MD;  Location: [place];  Service: Radiology;  Laterality: N/A;  . ROBOT ASSISTED LAPAROSCOPIC RADICAL PROSTATECTOMY  01/31/2011   Robotic-assisted laparoscopic radical retropubic    Current Medications: No outpatient medications have been marked as taking for the 08/26/20 encounter (Office Visit) with Martinique, Alastair Hennes M, MD.     Allergies:   Hydrochlorothiazide, Lasix [furosemide], Nsaids, and Sulfa antibiotics   Social History   Socioeconomic History  . Marital status: Married    Spouse name: Not on file  . Number of children: 2  . Years of education: Not on file  . Highest education level: Not on file  Occupational History  . Occupation: TEFL teacher  Tobacco Use  . Smoking status: Former Smoker    Packs/day: 2.00  Years: 25.00    Pack years: 50.00    Types: Cigarettes    Quit date: 04/14/2014    Years since quitting: 6.3  . Smokeless tobacco: Never Used  Vaping Use  . Vaping Use: Never used  Substance and Sexual Activity  . Alcohol use: Yes    Alcohol/week: 7.0 - 10.0 standard drinks    Types: 7 - 10 Standard drinks or equivalent per week    Comment: 2 bottles of wine with wife a week; cocktail every other night  . Drug use: No  . Sexual activity: Not Currently  Other Topics Concern  . Not on file  Social History Narrative   Fun/Hobby: Walk, cards, gardening    Social Determinants of Health   Financial Resource Strain: Not on file  Food Insecurity: Not on file  Transportation Needs: Not on file  Physical Activity: Not on file  Stress: Not on file  Social Connections: Not on file     Family History: The patient's family history includes Atrial fibrillation in his father; Breast cancer in his mother; Colon polyps in his brother; Leukemia in his brother; Lung cancer in his mother; Prostate  cancer in his father. There is no history of Colon cancer or Diabetes.  ROS:   Please see the history of present illness.     All other systems reviewed and are negative.  EKGs/Labs/Other Studies Reviewed:    The following studies were reviewed today:  Echo 03/14/2020 1. Left ventricular ejection fraction, by estimation, is 55 to 60%. The  left ventricle has normal function. The left ventricle has no regional  wall motion abnormalities. There is moderate left ventricular hypertrophy.  Left ventricular diastolic  parameters are consistent with Grade III diastolic dysfunction  (restrictive). Elevated left atrial pressure.  2. Right ventricular systolic function is normal. The right ventricular  size is mildly enlarged. Tricuspid regurgitation signal is inadequate for  assessing PA pressure.  3. Left atrial size was moderately dilated.  4. Right atrial size was mild to moderately dilated.  5. The mitral valve is grossly normal. Mild mitral valve regurgitation.  No evidence of mitral stenosis.  6. The aortic valve is tricuspid. Aortic valve regurgitation is trivial.  No aortic stenosis is present.   Comparison(s): No significant change from prior study.   EKG:  EKG is ordered today.  The ekg ordered today demonstrates Afib with rate 103. LAD, LVH with repolarization abnormality. I have personally reviewed and interpreted this study.   Recent Labs: 03/14/2020: B Natriuretic Peptide 895.2 03/15/2020: Magnesium 1.8 06/04/2020: BUN 38; Creatinine 2.9; Hemoglobin 9.9; Platelets 228; Potassium 4.0; Sodium 130  Recent Lipid Panel    Component Value Date/Time   CHOL 113 05/28/2017 1107   TRIG 49.0 05/28/2017 1107   HDL 59.50 05/28/2017 1107   CHOLHDL 2 05/28/2017 1107   VLDL 9.8 05/28/2017 1107   LDLCALC 44 05/28/2017 1107    Physical Exam:    VS:  There were no vitals taken for this visit.    Wt Readings from Last 3 Encounters:  05/11/20 159 lb (72.1 kg)  04/06/20 142 lb  6.4 oz (64.6 kg)  03/22/20 158 lb 9.6 oz (71.9 kg)     GEN:  Well nourished, well developed in no acute distress HEENT: Normal NECK: No JVD; No carotid bruits LYMPHATICS: No lymphadenopathy CARDIAC: IRRR, no murmurs, rubs, gallops RESPIRATORY:  Clear to auscultation without rales, wheezing or rhonchi  ABDOMEN: Soft, non-tender, non-distended MUSCULOSKELETAL:  No edema; No deformity  SKIN: Warm  and dry NEUROLOGIC:  Alert and oriented x 3 PSYCHIATRIC:  Normal affect   ASSESSMENT:    1. PAF (paroxysmal atrial fibrillation) ([place])   2. Chronic diastolic CHF (congestive heart failure) ([place])   3. CKD stage G4/A1, GFR 15-29 and albumin creatinine ratio <30 mg/g (HCC)   4. Essential hypertension    PLAN:    In order of problems listed above:  1. Chronic diastolic CHF. Appears to be well compensated on lasix 40 mg daily. Weight is down and no edema. Continue current therapy.   2. 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   3. PAF: he is in Afib today. Rate is well controlled. He is asymptomatic. Previously he was bradycardic in NSR and diltiazem dose was reduced. He is on low-dose Xarelto.  Since he is asymptomatic I don't feel we need to cardiovert.   4. Hypertension: Blood pressure stable on current therapy.  5. DM2: Managed by primary care provider     Medication Adjustments/Labs and Tests Ordered: Current medicines are reviewed at length with the patient today.  Concerns regarding medicines are outlined above.  No orders of the defined types were placed in this encounter.  Meds ordered this encounter  Medications  . furosemide (LASIX) 40 MG tablet    Sig: Take 1 tablet (40 mg total) by mouth daily.    There are no Patient Instructions on file for this visit.   Signed, Korde Jeppsen Martinique, MD  08/26/2020 10:13 AM    [facility]

## 2020-08-26 ENCOUNTER — Encounter: Payer: Self-pay | Admitting: Cardiology

## 2020-08-26 ENCOUNTER — Other Ambulatory Visit: Payer: Self-pay

## 2020-08-26 ENCOUNTER — Ambulatory Visit: Payer: Medicare HMO | Admitting: Cardiology

## 2020-08-26 DIAGNOSIS — I480 Paroxysmal atrial fibrillation: Secondary | ICD-10-CM | POA: Diagnosis not present

## 2020-08-26 DIAGNOSIS — N184 Chronic kidney disease, stage 4 (severe): Secondary | ICD-10-CM | POA: Diagnosis not present

## 2020-08-26 DIAGNOSIS — I5032 Chronic diastolic (congestive) heart failure: Secondary | ICD-10-CM

## 2020-08-26 DIAGNOSIS — I10 Essential (primary) hypertension: Secondary | ICD-10-CM | POA: Diagnosis not present

## 2020-08-26 MED ORDER — FUROSEMIDE 40 MG PO TABS: 40.0000 mg | ORAL_TABLET | Freq: Every day | ORAL | Status: AC

## 2020-08-26 NOTE — Addendum Note (Signed)
Addended by: Kathyrn Lass on: 08/26/2020 10:17 AM   Modules accepted: Orders

## 2020-10-11 ENCOUNTER — Other Ambulatory Visit: Payer: Self-pay | Admitting: Family Medicine

## 2020-10-12 ENCOUNTER — Other Ambulatory Visit: Payer: Self-pay | Admitting: *Deleted

## 2020-10-12 ENCOUNTER — Other Ambulatory Visit: Payer: Self-pay

## 2020-10-12 ENCOUNTER — Encounter: Payer: Self-pay | Admitting: Physician Assistant

## 2020-10-12 ENCOUNTER — Ambulatory Visit: Payer: Medicare HMO | Admitting: Physician Assistant

## 2020-10-12 DIAGNOSIS — F5101 Primary insomnia: Secondary | ICD-10-CM

## 2020-10-12 DIAGNOSIS — Z86018 Personal history of other benign neoplasm: Secondary | ICD-10-CM

## 2020-10-12 DIAGNOSIS — D485 Neoplasm of uncertain behavior of skin: Secondary | ICD-10-CM

## 2020-10-12 DIAGNOSIS — L720 Epidermal cyst: Secondary | ICD-10-CM

## 2020-10-12 DIAGNOSIS — D225 Melanocytic nevi of trunk: Secondary | ICD-10-CM | POA: Diagnosis not present

## 2020-10-12 DIAGNOSIS — D2262 Melanocytic nevi of left upper limb, including shoulder: Secondary | ICD-10-CM

## 2020-10-12 NOTE — Patient Instructions (Signed)

## 2020-10-12 NOTE — Telephone Encounter (Signed)
Pharmacy requesting Refill Rx Ambien  LAST APPOINTMENT DATE: 03/22/2020   NEXT APPOINTMENT DATE: Visit date not found    LAST REFILL: 04/07/2020  QTY: 30  REF 5

## 2020-10-13 MED ORDER — ZOLPIDEM TARTRATE 10 MG PO TABS: 10.0000 mg | ORAL_TABLET | Freq: Every evening | ORAL | 5 refills | Status: DC | PRN

## 2020-10-14 ENCOUNTER — Ambulatory Visit: Payer: Medicare HMO | Admitting: Physician Assistant

## 2020-10-17 ENCOUNTER — Telehealth: Payer: Self-pay

## 2020-10-17 NOTE — Telephone Encounter (Signed)
Left message to call office

## 2020-10-17 NOTE — Telephone Encounter (Signed)
-----   Message from Warren Danes, [place] sent at 10/17/2020  9:26 AM EST ----- WS in 6 months

## 2020-10-20 ENCOUNTER — Telehealth: Payer: Self-pay

## 2020-10-20 NOTE — Telephone Encounter (Signed)
Phone call to patient with his pathology results. Patient aware.  

## 2020-10-20 NOTE — Telephone Encounter (Signed)
-----   Message from Warren Danes, [place] sent at 10/17/2020  9:26 AM EST ----- WS in 6 months

## 2020-10-20 NOTE — Telephone Encounter (Signed)
This encounter was created in error - please disregard.

## 2020-10-24 DIAGNOSIS — I129 Hypertensive chronic kidney disease with stage 1 through stage 4 chronic kidney disease, or unspecified chronic kidney disease: Secondary | ICD-10-CM | POA: Diagnosis not present

## 2020-10-24 DIAGNOSIS — N184 Chronic kidney disease, stage 4 (severe): Secondary | ICD-10-CM | POA: Diagnosis not present

## 2020-10-24 LAB — COMPREHENSIVE METABOLIC PANEL
Albumin: 4 (ref 3.5–5.0)
Calcium: 8.7 (ref 8.7–10.7)

## 2020-10-24 LAB — BASIC METABOLIC PANEL
BUN: 52 — AB (ref 4–21)
CO2: 25 — AB (ref 13–22)
Chloride: 101 (ref 99–108)
Creatinine: 3.7 — AB (ref 0.6–1.3)
Potassium: 4 (ref 3.4–5.3)
Sodium: 134 — AB (ref 137–147)

## 2020-10-24 LAB — CBC AND DIFFERENTIAL
HCT: 32 — AB (ref 41–53)
Hemoglobin: 10.8 — AB (ref 13.5–17.5)
Platelets: 250 (ref 150–399)
WBC: 5

## 2020-10-24 LAB — CBC: RBC: 3.38 — AB (ref 3.87–5.11)

## 2020-10-26 DIAGNOSIS — D631 Anemia in chronic kidney disease: Secondary | ICD-10-CM | POA: Diagnosis not present

## 2020-10-26 DIAGNOSIS — Z7984 Long term (current) use of oral hypoglycemic drugs: Secondary | ICD-10-CM | POA: Diagnosis not present

## 2020-10-26 DIAGNOSIS — E1122 Type 2 diabetes mellitus with diabetic chronic kidney disease: Secondary | ICD-10-CM | POA: Diagnosis not present

## 2020-10-26 DIAGNOSIS — Z87891 Personal history of nicotine dependence: Secondary | ICD-10-CM | POA: Diagnosis not present

## 2020-10-26 DIAGNOSIS — I129 Hypertensive chronic kidney disease with stage 1 through stage 4 chronic kidney disease, or unspecified chronic kidney disease: Secondary | ICD-10-CM | POA: Diagnosis not present

## 2020-10-26 DIAGNOSIS — N184 Chronic kidney disease, stage 4 (severe): Secondary | ICD-10-CM | POA: Diagnosis not present

## 2020-10-31 ENCOUNTER — Encounter: Payer: Self-pay | Admitting: Family Medicine

## 2020-11-07 ENCOUNTER — Encounter: Payer: Self-pay | Admitting: Physician Assistant

## 2020-11-07 NOTE — Progress Notes (Signed)
Follow-Up Visit   Subjective  Douglas Ballard is a 70 y.o. male who presents for the following: Follow-up (Spot on the left side of stomach that continues to grow rapidly, no change in color, no bleeding, no draining x 6 months).   The following portions of the chart were reviewed this encounter and updated as appropriate:      Objective  Well appearing patient in no apparent distress; mood and affect are within normal limits.  A full examination was performed including scalp, head, eyes, ears, nose, lips, neck, chest, axillae, abdomen, back, buttocks, bilateral upper extremities, bilateral lower extremities, hands, feet, fingers, toes, fingernails, and toenails. All findings within normal limits unless otherwise noted below.  Objective  Left Chest, Left Upper Arm - Anterior: History of multiple atypical moles  Objective  Left Abdomen (side) - Lower: White growth on pink base      Objective  Left Upper Arm - Posterior: Dark macule       Objective  Right Mid Back: Dark macule          Assessment & Plan  History of atypical skin mole (2) Left Upper Arm - Anterior; Left Chest  observe  Neoplasm of uncertain behavior of skin (3) Left Abdomen (side) - Lower  Skin / nail biopsy Type of biopsy: tangential   Informed consent: discussed and consent obtained   Timeout: patient name, date of birth, surgical site, and procedure verified   Procedure prep:  Patient was prepped and draped in usual sterile fashion (Non sterile) Prep type:  Chlorhexidine Anesthesia: the lesion was anesthetized in a standard fashion   Anesthetic:  1% lidocaine w/ epinephrine 1-100,000 local infiltration Instrument used: flexible razor blade   Hemostasis achieved with: aluminum chloride and electrodesiccation   Outcome: patient tolerated procedure well   Post-procedure details: sterile dressing applied and wound care instructions given   Dressing type: bandage and petrolatum     Specimen 1 - Surgical pathology Differential Diagnosis: R/O Cyst  Check Margins: No  Cautery after biopsy  Left Upper Arm - Posterior  Skin / nail biopsy Type of biopsy: tangential   Informed consent: discussed and consent obtained   Timeout: patient name, date of birth, surgical site, and procedure verified   Procedure prep:  Patient was prepped and draped in usual sterile fashion (Non sterile) Prep type:  Chlorhexidine Anesthesia: the lesion was anesthetized in a standard fashion   Anesthetic:  1% lidocaine w/ epinephrine 1-100,000 local infiltration Instrument used: flexible razor blade   Hemostasis achieved with: aluminum chloride and electrodesiccation   Outcome: patient tolerated procedure well   Post-procedure details: sterile dressing applied and wound care instructions given   Dressing type: bandage and petrolatum    Specimen 2 - Surgical pathology Differential Diagnosis: R/O Atypia  Check Margins: No  Cautery after biopsy  Right Mid Back  Skin / nail biopsy Type of biopsy: tangential   Informed consent: discussed and consent obtained   Timeout: patient name, date of birth, surgical site, and procedure verified   Procedure prep:  Patient was prepped and draped in usual sterile fashion (Non sterile) Prep type:  Chlorhexidine Anesthesia: the lesion was anesthetized in a standard fashion   Anesthetic:  1% lidocaine w/ epinephrine 1-100,000 local infiltration Instrument used: flexible razor blade   Hemostasis achieved with: aluminum chloride and electrodesiccation   Outcome: patient tolerated procedure well   Post-procedure details: sterile dressing applied and wound care instructions given   Dressing type: bandage and petrolatum  Specimen 3 - Surgical pathology Differential Diagnosis: R/O Atypia  Check Margins: No  Cautery after biopsy    I, Kadasia Kassing, PA-C, have reviewed all documentation's for this visit.  The documentation on 11/07/20 for  the exam, diagnosis, procedures and orders are all accurate and complete.

## 2020-11-09 ENCOUNTER — Other Ambulatory Visit: Payer: Self-pay | Admitting: Family Medicine

## 2020-11-10 MED ORDER — FOLIC ACID 1 MG PO TABS: 1.0000 mg | ORAL_TABLET | Freq: Every day | ORAL | 0 refills | Status: DC

## 2020-11-14 ENCOUNTER — Other Ambulatory Visit: Payer: Self-pay | Admitting: Family Medicine

## 2020-11-22 DIAGNOSIS — N184 Chronic kidney disease, stage 4 (severe): Secondary | ICD-10-CM | POA: Diagnosis not present

## 2020-11-23 DIAGNOSIS — D631 Anemia in chronic kidney disease: Secondary | ICD-10-CM | POA: Diagnosis not present

## 2020-11-23 DIAGNOSIS — N184 Chronic kidney disease, stage 4 (severe): Secondary | ICD-10-CM | POA: Diagnosis not present

## 2020-12-13 ENCOUNTER — Encounter: Payer: Self-pay | Admitting: Family Medicine

## 2020-12-15 NOTE — Telephone Encounter (Signed)
Left message to return call to our office at their convenience.  

## 2020-12-18 ENCOUNTER — Other Ambulatory Visit: Payer: Self-pay | Admitting: Cardiology

## 2020-12-19 DIAGNOSIS — I129 Hypertensive chronic kidney disease with stage 1 through stage 4 chronic kidney disease, or unspecified chronic kidney disease: Secondary | ICD-10-CM | POA: Diagnosis not present

## 2020-12-19 DIAGNOSIS — N184 Chronic kidney disease, stage 4 (severe): Secondary | ICD-10-CM | POA: Diagnosis not present

## 2021-01-02 ENCOUNTER — Other Ambulatory Visit: Payer: Self-pay | Admitting: Family Medicine

## 2021-01-12 ENCOUNTER — Other Ambulatory Visit: Payer: Self-pay

## 2021-01-12 ENCOUNTER — Ambulatory Visit (INDEPENDENT_AMBULATORY_CARE_PROVIDER_SITE_OTHER): Payer: Medicare HMO | Admitting: Physician Assistant

## 2021-01-12 ENCOUNTER — Encounter: Payer: Self-pay | Admitting: Physician Assistant

## 2021-01-12 DIAGNOSIS — D2262 Melanocytic nevi of left upper limb, including shoulder: Secondary | ICD-10-CM | POA: Diagnosis not present

## 2021-01-12 DIAGNOSIS — D229 Melanocytic nevi, unspecified: Secondary | ICD-10-CM

## 2021-01-12 DIAGNOSIS — L814 Other melanin hyperpigmentation: Secondary | ICD-10-CM | POA: Diagnosis not present

## 2021-01-12 NOTE — Progress Notes (Signed)
   Follow-Up Visit   Subjective  Douglas Ballard is a 70 y.o. male who presents for the following: Procedure (Here for widershave left upper arm. Moderate to severe atypia. ).   The following portions of the chart were reviewed this encounter and updated as appropriate:  Tobacco  Allergies  Meds  Problems  Med Hx  Surg Hx  Fam Hx      Objective  Well appearing patient in no apparent distress; mood and affect are within normal limits.  All skin waist up examined.  Objective  Left Upper Arm - Posterior: Pink macule   Assessment & Plan  Atypical nevus Left Upper Arm - Posterior  Epidermal / dermal shaving - Left Upper Arm - Posterior  Lesion diameter (cm):  1 Informed consent: discussed and consent obtained   Timeout: patient name, date of birth, surgical site, and procedure verified   Procedure prep:  Patient was prepped and draped in usual sterile fashion Prep type:  Chlorhexidine Anesthesia: the lesion was anesthetized in a standard fashion   Anesthetic:  1% lidocaine w/ epinephrine 1-100,000 local infiltration Instrument used: DermaBlade   Hemostasis achieved with: aluminum chloride   Outcome: patient tolerated procedure well   Post-procedure details: sterile dressing applied and wound care instructions given   Dressing type: petrolatum gauze, petrolatum and bandage    Specimen 1 - Surgical pathology Differential Diagnosis: WIDErSHAVE  Check Margins: yes [id number]     I, Wane Mollett, PA-C, have reviewed all documentation's for this visit.  The documentation on 01/12/21 for the exam, diagnosis, procedures and orders are all accurate and complete.

## 2021-01-12 NOTE — Patient Instructions (Signed)

## 2021-04-03 ENCOUNTER — Other Ambulatory Visit: Payer: Self-pay | Admitting: Family Medicine

## 2021-04-10 ENCOUNTER — Telehealth: Payer: Self-pay | Admitting: Cardiology

## 2021-04-10 MED ORDER — RIVAROXABAN 15 MG PO TABS: 15.0000 mg | ORAL_TABLET | Freq: Every day | ORAL | 1 refills | Status: DC

## 2021-04-10 MED ORDER — RIVAROXABAN 15 MG PO TABS: 15.0000 mg | ORAL_TABLET | Freq: Every day | ORAL | 0 refills | Status: DC

## 2021-04-10 NOTE — Telephone Encounter (Signed)
Samples left at desk

## 2021-04-10 NOTE — Telephone Encounter (Signed)
Lmom pt that we sent xarelto to [facility] and have placed some samples here in the meantime

## 2021-04-10 NOTE — Telephone Encounter (Signed)
*  STAT* If patient is at the pharmacy, call can be transferred to refill team.   1. Which medications need to be refilled? (please list name of each medication and dose if known) xarelto   2. Which pharmacy/location (including street and city if local pharmacy) is medication to be sent to? Please call [phone]  3. Do they need a 30 day or 90 day supply? [place]

## 2021-04-10 NOTE — Telephone Encounter (Signed)
Follow up:     Patient is out of this medication

## 2021-04-10 NOTE — Telephone Encounter (Signed)
I need permission from a pharmd to place a sample since the pt is currently out of it Refill for xarelto 15mg  sent to [facility] as requested. Prescription refill request for Xarelto received.  Indication:afib Last office visit:jordan 08/26/20 Weight:72.1kg Age:82m Scr:3.7 10/24/20 CrCl:19.5

## 2021-04-11 ENCOUNTER — Other Ambulatory Visit: Payer: Self-pay

## 2021-04-11 ENCOUNTER — Encounter: Payer: Self-pay | Admitting: Physician Assistant

## 2021-04-11 ENCOUNTER — Ambulatory Visit: Payer: Medicare HMO | Admitting: Physician Assistant

## 2021-04-11 DIAGNOSIS — D2239 Melanocytic nevi of other parts of face: Secondary | ICD-10-CM

## 2021-04-11 DIAGNOSIS — D2262 Melanocytic nevi of left upper limb, including shoulder: Secondary | ICD-10-CM

## 2021-04-11 DIAGNOSIS — D224 Melanocytic nevi of scalp and neck: Secondary | ICD-10-CM | POA: Diagnosis not present

## 2021-04-11 DIAGNOSIS — Z86018 Personal history of other benign neoplasm: Secondary | ICD-10-CM

## 2021-04-11 DIAGNOSIS — Z1283 Encounter for screening for malignant neoplasm of skin: Secondary | ICD-10-CM | POA: Diagnosis not present

## 2021-04-11 DIAGNOSIS — D485 Neoplasm of uncertain behavior of skin: Secondary | ICD-10-CM

## 2021-04-11 NOTE — Patient Instructions (Signed)

## 2021-04-18 ENCOUNTER — Ambulatory Visit: Payer: Medicare HMO | Admitting: Family Medicine

## 2021-04-27 ENCOUNTER — Other Ambulatory Visit: Payer: Self-pay | Admitting: Family Medicine

## 2021-04-27 DIAGNOSIS — F5101 Primary insomnia: Secondary | ICD-10-CM

## 2021-05-03 ENCOUNTER — Encounter: Payer: Self-pay | Admitting: Physician Assistant

## 2021-05-03 NOTE — Progress Notes (Signed)
   Follow-Up Visit   Subjective  Douglas Ballard is a 70 y.o. male who presents for the following: Annual Exam (New lesion Left post neck- few moles that are irritated, right cheek- no changes. Patient has personal history of several atypical nevi, but no non mole skin cancers. ).   The following portions of the chart were reviewed this encounter and updated as appropriate:  Tobacco  Allergies  Meds  Problems  Med Hx  Surg Hx  Fam Hx      Objective  Well appearing patient in no apparent distress; mood and affect are within normal limits.  A full examination was performed including scalp, head, eyes, ears, nose, lips, neck, chest, axillae, abdomen, back, buttocks, bilateral upper extremities, bilateral lower extremities, hands, feet, fingers, toes, fingernails, and toenails. All findings within normal limits unless otherwise noted below.  Left Shoulder - Posterior Bichromic dark nested macule.      Neck - Posterior Bichromic dark nested macule.      Left Malar Cheek Bichromic dark nested macule.       Assessment & Plan  Neoplasm of uncertain behavior of skin (3) Left Shoulder - Posterior  Skin / nail biopsy Type of biopsy: tangential   Informed consent: discussed and consent obtained   Timeout: patient name, date of birth, surgical site, and procedure verified   Anesthesia: the lesion was anesthetized in a standard fashion   Anesthetic:  1% lidocaine w/ epinephrine 1-100,000 local infiltration Instrument used: flexible razor blade   Hemostasis achieved with: ferric subsulfate   Outcome: patient tolerated procedure well   Post-procedure details: wound care instructions given    Specimen 1 - Surgical pathology Differential Diagnosis: Atypia  Check Margins: yes  Neck - Posterior  Skin / nail biopsy Type of biopsy: tangential   Informed consent: discussed and consent obtained   Timeout: patient name, date of birth, surgical site, and procedure verified    Anesthesia: the lesion was anesthetized in a standard fashion   Anesthetic:  1% lidocaine w/ epinephrine 1-100,000 local infiltration Instrument used: flexible razor blade   Hemostasis achieved with: ferric subsulfate   Outcome: patient tolerated procedure well   Post-procedure details: wound care instructions given    Specimen 2 - Surgical pathology Differential Diagnosis: atypia  Check Margins: yes  Left Malar Cheek  Skin / nail biopsy Type of biopsy: tangential   Informed consent: discussed and consent obtained   Timeout: patient name, date of birth, surgical site, and procedure verified   Anesthesia: the lesion was anesthetized in a standard fashion   Anesthetic:  1% lidocaine w/ epinephrine 1-100,000 local infiltration Instrument used: flexible razor blade   Hemostasis achieved with: ferric subsulfate   Outcome: patient tolerated procedure well   Post-procedure details: wound care instructions given    Specimen 3 - Surgical pathology Differential Diagnosis: atypia  Check Margins: yes    I, Tenille Morrill, PA-C, have reviewed all documentation's for this visit.  The documentation on 05/03/21 for the exam, diagnosis, procedures and orders are all accurate and complete.

## 2021-05-16 ENCOUNTER — Encounter: Payer: Self-pay | Admitting: Family Medicine

## 2021-05-16 ENCOUNTER — Ambulatory Visit (INDEPENDENT_AMBULATORY_CARE_PROVIDER_SITE_OTHER): Payer: Medicare HMO | Admitting: Family Medicine

## 2021-05-16 ENCOUNTER — Other Ambulatory Visit: Payer: Self-pay

## 2021-05-16 VITALS — BP 153/86 | HR 80 | Temp 98.0°F | Ht 69.0 in | Wt 161.8 lb

## 2021-05-16 DIAGNOSIS — N184 Chronic kidney disease, stage 4 (severe): Secondary | ICD-10-CM

## 2021-05-16 DIAGNOSIS — Z23 Encounter for immunization: Secondary | ICD-10-CM

## 2021-05-16 DIAGNOSIS — I729 Aneurysm of unspecified site: Secondary | ICD-10-CM | POA: Diagnosis not present

## 2021-05-16 DIAGNOSIS — C61 Malignant neoplasm of prostate: Secondary | ICD-10-CM | POA: Insufficient documentation

## 2021-05-16 DIAGNOSIS — G4700 Insomnia, unspecified: Secondary | ICD-10-CM | POA: Diagnosis not present

## 2021-05-16 DIAGNOSIS — E1159 Type 2 diabetes mellitus with other circulatory complications: Secondary | ICD-10-CM

## 2021-05-16 DIAGNOSIS — Z13220 Encounter for screening for lipoid disorders: Secondary | ICD-10-CM | POA: Diagnosis not present

## 2021-05-16 DIAGNOSIS — N183 Chronic kidney disease, stage 3 unspecified: Secondary | ICD-10-CM | POA: Diagnosis not present

## 2021-05-16 DIAGNOSIS — Z0001 Encounter for general adult medical examination with abnormal findings: Secondary | ICD-10-CM

## 2021-05-16 DIAGNOSIS — E1122 Type 2 diabetes mellitus with diabetic chronic kidney disease: Secondary | ICD-10-CM | POA: Diagnosis not present

## 2021-05-16 DIAGNOSIS — I152 Hypertension secondary to endocrine disorders: Secondary | ICD-10-CM

## 2021-05-16 LAB — LIPID PANEL
Cholesterol: 107 mg/dL (ref 0–200)
HDL: 60.8 mg/dL (ref >39.00)
LDL Cholesterol: 41 mg/dL (ref 0–99)
NonHDL: 46.61
Total CHOL/HDL Ratio: 2
Triglycerides: 28 mg/dL (ref 0.0–149.0)
VLDL: 5.6 mg/dL (ref 0.0–40.0)

## 2021-05-16 LAB — COMPREHENSIVE METABOLIC PANEL
ALT: 9 U/L (ref 0–53)
AST: 10 U/L (ref 0–37)
Albumin: 3.8 g/dL (ref 3.5–5.2)
Alkaline Phosphatase: 57 U/L (ref 39–117)
BUN: 54 mg/dL — ABNORMAL HIGH (ref 6–23)
CO2: 25 mEq/L (ref 19–32)
Calcium: 8.8 mg/dL (ref 8.4–10.5)
Chloride: 99 mEq/L (ref 96–112)
Creatinine, Ser: 3.26 mg/dL — ABNORMAL HIGH (ref 0.40–1.50)
GFR: 18.56 mL/min — ABNORMAL LOW (ref >60.00)
Glucose, Bld: 169 mg/dL — ABNORMAL HIGH (ref 70–99)
Potassium: 4 mEq/L (ref 3.5–5.1)
Sodium: 132 mEq/L — ABNORMAL LOW (ref 135–145)
Total Bilirubin: 0.6 mg/dL (ref 0.2–1.2)
Total Protein: 7 g/dL (ref 6.0–8.3)

## 2021-05-16 LAB — TSH: TSH: 2.11 u[IU]/mL (ref 0.35–5.50)

## 2021-05-16 LAB — CBC
HCT: 31.3 % — ABNORMAL LOW (ref 39.0–52.0)
Hemoglobin: 10.6 g/dL — ABNORMAL LOW (ref 13.0–17.0)
MCHC: 34 g/dL (ref 30.0–36.0)
MCV: 100.5 fl — ABNORMAL HIGH (ref 78.0–100.0)
Platelets: 196 10*3/uL (ref 150.0–400.0)
RBC: 3.11 Mil/uL — ABNORMAL LOW (ref 4.22–5.81)
RDW: 12.6 % (ref 11.5–15.5)
WBC: 5.9 10*3/uL (ref 4.0–10.5)

## 2021-05-16 LAB — MICROALBUMIN / CREATININE URINE RATIO
Creatinine,U: 32.2 mg/dL
Microalb Creat Ratio: 67.4 mg/g — ABNORMAL HIGH (ref 0.0–30.0)
Microalb, Ur: 21.7 mg/dL — ABNORMAL HIGH (ref 0.0–1.9)

## 2021-05-16 LAB — HEMOGLOBIN A1C: Hgb A1c MFr Bld: 6.8 % — ABNORMAL HIGH (ref 4.6–6.5)

## 2021-05-16 MED ORDER — SITAGLIPTIN PHOSPHATE 100 MG PO TABS: 100.0000 mg | ORAL_TABLET | Freq: Every day | ORAL | 3 refills | Status: DC

## 2021-05-16 MED ORDER — HYDROXYZINE HCL 50 MG PO TABS: 50.0000 mg | ORAL_TABLET | Freq: Three times a day (TID) | ORAL | 1 refills | Status: DC | PRN

## 2021-05-16 MED ORDER — FOLIC ACID 1 MG PO TABS: 1.0000 mg | ORAL_TABLET | Freq: Every day | ORAL | 3 refills | Status: DC

## 2021-05-16 MED ORDER — CLONIDINE HCL 0.1 MG PO TABS: 0.1000 mg | ORAL_TABLET | Freq: Every day | ORAL | 3 refills | Status: DC

## 2021-05-16 MED ORDER — FERROUS SULFATE 325 (65 FE) MG PO TABS: 325.0000 mg | ORAL_TABLET | Freq: Every day | ORAL | 3 refills | Status: DC

## 2021-05-16 NOTE — Assessment & Plan Note (Addendum)
Follows with nephrology.  We will check c-Met.

## 2021-05-16 NOTE — Patient Instructions (Signed)
It was very nice to see you today!  I will refill your medications.  We will check blood work today.  For your flu shot today.  I will see you back in 6 to 12 months.  Depending on the results of your blood work.  Please let us know if you need to be seen sooner.  Take care, Dr [name]  PLEASE NOTE:  If you had any lab tests please let us know if you have not heard back within a few days. You may see your results on mychart before we have a chance to review them but we will give you a call once they are reviewed by [place]. If we ordered any referrals today, please let us know if you have not heard from their office within the next week.   Please try these tips to maintain a healthy lifestyle:  Eat at least 3 REAL meals and 1-2 snacks per day.  Aim for no more than 5 hours between eating.  If you eat breakfast, please do so within one hour of getting up.   Each meal should contain half fruits/vegetables, one quarter protein, and one quarter carbs (no bigger than a computer mouse)  Cut down on sweet beverages. This includes juice, soda, and sweet tea.   Drink at least 1 glass of water with each meal and aim for at least 8 glasses per day  Exercise at least 150 minutes every week.    Preventive Care 70 Years and Older, Male Preventive care refers to lifestyle choices and visits with your health care provider that can promote health and wellness. This includes: A yearly physical exam. This is also called an annual wellness visit. Regular dental and eye exams. Immunizations. Screening for certain conditions. Healthy lifestyle choices, such as: Eating a healthy diet. Getting regular exercise. Not using drugs or products that contain nicotine and tobacco. Limiting alcohol use. What can I expect for my preventive care visit? Physical exam Your health care provider will check your: Height and weight. These may be used to calculate your BMI (body mass index). BMI is a measurement that tells  if you are at a healthy weight. Heart rate and blood pressure. Body temperature. Skin for abnormal spots. Counseling Your health care provider may ask you questions about your: Past medical problems. Family's medical history. Alcohol, tobacco, and drug use. Emotional well-being. Home life and relationship well-being. Sexual activity. Diet, exercise, and sleep habits. History of falls. Memory and ability to understand (cognition). Work and work Statistician. Access to firearms. What immunizations do I need? Vaccines are usually given at various ages, according to a schedule. Your health care provider will recommend vaccines for you based on your age, medical history, and lifestyle or other factors, such as travel or where you work. What tests do I need? Blood tests Lipid and cholesterol levels. These may be checked every 5 years, or more often depending on your overall health. Hepatitis C test. Hepatitis B test. Screening Lung cancer screening. You may have this screening every year starting at age 48 if you have a 30-pack-year history of smoking and currently smoke or have quit within the past 15 years. Colorectal cancer screening. All adults should have this screening starting at age 1 and continuing until age 16. Your health care provider may recommend screening at age 52 if you are at increased risk. You will have tests every 1-10 years, depending on your results and the type of screening test. Prostate cancer screening. Recommendations will  vary depending on your family history and other risks. Genital exam to check for testicular cancer or hernias. Diabetes screening. This is done by checking your blood sugar (glucose) after you have not eaten for a while (fasting). You may have this done every 1-3 years. Abdominal aortic aneurysm (AAA) screening. You may need this if you are a current or former smoker. STD (sexually transmitted disease) testing, if you are at risk. Follow  these instructions at home: Eating and drinking  Eat a diet that includes fresh fruits and vegetables, whole grains, lean protein, and low-fat dairy products. Limit your intake of foods with high amounts of sugar, saturated fats, and salt. Take vitamin and mineral supplements as recommended by your health care provider. Do not drink alcohol if your health care provider tells you not to drink. If you drink alcohol: Limit how much you have to 0-2 drinks a day. Be aware of how much alcohol is in your drink. In the [place] one drink equals one 12 oz bottle of beer (355 mL), one 5 oz glass of wine (148 mL), or one 1 oz glass of hard liquor (44 mL). Lifestyle Take daily care of your teeth and gums. Brush your teeth every morning and night with fluoride toothpaste. Floss one time each day. Stay active. Exercise for at least 30 minutes 5 or more days each week. Do not use any products that contain nicotine or tobacco, such as cigarettes, e-cigarettes, and chewing tobacco. If you need help quitting, ask your health care provider. Do not use drugs. If you are sexually active, practice safe sex. Use a condom or other form of protection to prevent STIs (sexually transmitted infections). Talk with your health care provider about taking a low-dose aspirin or statin. Find healthy ways to cope with stress, such as: Meditation, yoga, or listening to music. Journaling. Talking to a trusted person. Spending time with friends and family. Safety Always wear your seat belt while driving or riding in a vehicle. Do not drive: If you have been drinking alcohol. Do not ride with someone who has been drinking. When you are tired or distracted. While texting. Wear a helmet and other protective equipment during sports activities. If you have firearms in your house, make sure you follow all gun safety procedures. What's next? Visit your health care provider once a year for an annual wellness visit. Ask your health  care provider how often you should have your eyes and teeth checked. Stay up to date on all vaccines. This information is not intended to replace advice given to you by your health care provider. Make sure you discuss any questions you have with your health care provider. Document Revised: 10/14/2020 Document Reviewed: 07/31/2018 Elsevier Patient Education  2022 [employer].

## 2021-05-16 NOTE — Progress Notes (Addendum)
Chief Complaint:  Douglas Ballard is a 70 y.o. male who presents today for his annual comprehensive physical exam.    Assessment/Plan:  Chronic Problems Addressed Today: CKD (chronic kidney disease) stage 3, GFR 30-59 ml/min ([place]) Follows with nephrology.  We will check c-Met.  Insomnia On Ambien.  We will restart hydroxyzine as well.  This is worked well for him in the past.  Prostate cancer [facility]) Follows with urology.  He has had PSA checked recently.  Hypertension associated with diabetes ([place]) Above goal.  Typically well controlled.  Continue current regimen per cardiology.   Type 2 diabetes mellitus with stage 4 chronic kidney disease (HCC) On Januvia 100 mg daily.  Will refill today.  Check labs including A1c.  Preventative Healthcare: Will get flu vaccine today. Will get blood work done today.  Patient Counseling(The following topics were reviewed and/or handout was given):  -Nutrition: Stressed importance of moderation in sodium/caffeine intake, saturated fat and cholesterol, caloric balance, sufficient intake of fresh fruits, vegetables, and fiber.  -Stressed the importance of regular exercise.   -Substance Abuse: Discussed cessation/primary prevention of tobacco, alcohol, or other drug use; driving or other dangerous activities under the influence; availability of treatment for abuse.   -Injury prevention: Discussed safety belts, safety helmets, smoke detector, smoking near bedding or upholstery.   -Sexuality: Discussed sexually transmitted diseases, partner selection, use of condoms, avoidance of unintended pregnancy and contraceptive alternatives.   -Dental health: Discussed importance of regular tooth brushing, flossing, and dental visits.  -Health maintenance and immunizations reviewed. Please refer to Health maintenance section.  Return to care in 1 year for next preventative visit.     Subjective:  HPI:  He has no acute complaints today.   He complains of  having difficulty time sleeping. This has been ongoing problem and Is getting worse .He reports he woke up 3:00 am last night.   He would like to start on Hydroxyzine for this problem.   Lifestyle Diet: Balanced Exercise: Likes to walk  Depression screen [facility] 2/9 05/16/2021  Decreased Interest 0  Down, Depressed, Hopeless 0  PHQ - 2 Score 0  Altered sleeping -  Tired, decreased energy -  Change in appetite -  Feeling bad or failure about yourself  -  Trouble concentrating -  Moving slowly or fidgety/restless -  Suicidal thoughts -  PHQ-9 Score -    Health Maintenance Due  Topic Date Due   Zoster Vaccines- Shingrix (1 of 2) Never done   OPHTHALMOLOGY EXAM  06/12/2019   FOOT EXAM  08/02/2019   COVID-19 Vaccine (4 - Booster for [place] series) 08/29/2020     ROS: Per HPI, otherwise a complete review of systems was negative.   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    Anxiety    Atrial fibrillation ([place])    Atypical mole 05/13/2008   LEFT MEDIAL CHEST SLIGHT/MOD.   Atypical mole 10/12/2020   Left Upper Arm-Posterior (moderate to severe)   Atypical mole 10/12/2020   Right Mid Back (Moderate)   Atypical nevi 05/13/2008   LEFT LATERAL CHEST SLIGHT   Atypical nevi 04/29/2015   RIGHT POST SHOULDER MOD/SEV. TX W/S   Atypical nevi 04/29/2015   RIGHT MID ABDOMEN MODERATE TX W/S   Atypical nevi 09/27/2015  LEFT UPPER ARM SEVERE TX W/S   Atypical nevi 09/27/2015   MID LOWER BACK MODERATE   Atypical nevi 09/27/2015   MID UPPER BACK SEVERE TX EXC   Atypical nevi 09/27/2015   LEFT UPPER BACK MOD.SEV TX EXC   Atypical nevi 03/23/2016   RIGHT LOWER BACK MILD   Atypical nevi 03/23/2016   LEFT SIDE TORSO MILD   Atypical nevi 06/14/2016   LEFT LOWER  BACK MOD/SEV    Atypical nevi 06/14/2016   LEFT CHEST MODERATE   Atypical nevi 08/30/2017   MID UPPER BACK SUP MODERATE   Atypical nevi 08/30/2017   RIGHT CHEST SEVERE    Atypical nevi 07/25/2018   LEFT SHOULDER ANTERIOR TX WIDERSHAVE   Atypical nevi 07/25/2018   LEFT SHOULDER POST MODERATE   Atypical nevi 08/25/2019   LEFT SIDE ABDOMEN MODERATE FREE   Atypical nevi 11/27/2019   RIGHT SHOULDER MODERATE TX W/S   Atypical nevi 11/27/2019   RIGHT UPPER BACK MOD/SEVERE TX W/S   Atypical nevi 11/27/2019   LEFT MID BACK SEVERE TX W/S   Cancer ([place]) 2012   Prostate surgery   Chronic diastolic CHF (congestive heart failure) ([place]) 11/01/2014   Echo 8/15: Mild LVH, EF 50-55%, moderate BAE  //  b. Echo 7/17: EF 55-60%, normal wall motion, grade 2 diastolic dysfunction, MAC, mild MR, moderate LAE, trivial PI   Chronic kidney disease    Compression fracture of thoracic vertebra ([place]) 06/26/2011   Diabetes mellitus type 2 in nonobese (HCC)    Fistula, bladder    Frequent urination at night    History of adenomatous polyp of colon 06/14/2014   History of nuclear stress test    a. Myoview 10/15: Overall Impression:  Low risk stress nuclear study demonstrating mild baseline ST-T changes with normal myocardial perfusion and low normal EF of 50%. // b.Myoview 7/17: EF 50%, no ischemia or scar, low risk (EF normal by recent echo)   Hypertension    PNA (pneumonia) 06/26/2011   Sepsis due to urinary tract infection ([place]) 04/16/2014   Stroke ([place]) sept 1, 2015   tia x 3   Patient Active Problem List   Diagnosis Date Noted   Prostate cancer ([place]) 05/16/2021   Gastritis 03/22/2020   Diverticulosis of colon without hemorrhage    Grade II internal hemorrhoids    Polyp of ascending colon    Polyp of transverse colon    Atrial fibrillation, chronic ([place]) 03/14/2020   CKD stage G4/A1, GFR 15-29 and albumin creatinine ratio <30 mg/g ([place]) 03/14/2020   Hypertensive urgency 03/14/2020   Anemia due to  chronic kidney disease 08/31/2018   Stress 08/01/2018   Type 2 diabetes mellitus with stage 4 chronic kidney disease ([place]) 09/06/2017   CKD (chronic kidney disease) stage 3, GFR 30-59 ml/min (HCC) 02/07/2016   Insomnia 09/19/2015   Coarse tremors 09/19/2015   Chronic diastolic CHF (congestive heart failure) ([place]) 11/01/2014   History of adenomatous polyp of colon 06/14/2014   Paroxysmal atrial fibrillation ([place]) 05/19/2014   Elevated bilirubin 04/14/2014   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   Hypertension associated with diabetes ([place]) 06/26/2011   Personal history of prostate cancer s/p prostatectomy 2012 06/26/2011   Past Surgical History:  Procedure Laterality Date   BIOPSY  03/16/2020   Procedure: BIOPSY;  Surgeon: Lavena Bullion, DO;  Location: [facility];  Service: Gastroenterology;;   COLON SURGERY     COLONOSCOPY N/A 06/14/2014   Procedure: COLONOSCOPY;  Surgeon: Gatha Mayer,  MD;  Location: [facility];  Service: Endoscopy;  Laterality: N/A;   COLONOSCOPY WITH PROPOFOL N/A 03/16/2020   Procedure: COLONOSCOPY WITH PROPOFOL;  Surgeon: Lavena Bullion, DO;  Location: [facility];  Service: Gastroenterology;  Laterality: N/A;   CYSTOSCOPY WITH STENT PLACEMENT Bilateral 06/15/2014   Procedure: CYSTOSCOPY WITH BILATERAL STENT PLACEMENT;  Surgeon: Bernestine Amass, MD;  Location: [facility];  Service: Urology;  Laterality: Bilateral;   ESOPHAGOGASTRODUODENOSCOPY (EGD) WITH PROPOFOL N/A 03/16/2020   Procedure: ESOPHAGOGASTRODUODENOSCOPY (EGD) WITH PROPOFOL;  Surgeon: Lavena Bullion, DO;  [facility];  Service: Gastroenterology;  Laterality: N/A;   LIPOMA EXCISION  2012   Dr Nonah Mattes   moles removed     Back   POLYPECTOMY  03/16/2020   Procedure: POLYPECTOMY;  Surgeon: Lavena Bullion, DO;  [facility]: [facility];  Service: Gastroenterology;;   PROCTOSCOPY N/A 06/15/2014   Procedure: RIGID PROCTOSCOPY;  Surgeon: Michael Boston, MD;  Location:  [facility];  Service: General;  Laterality: N/A;   RADIOLOGY WITH ANESTHESIA N/A 07/28/2014   Procedure: Embolization;  Surgeon: Luanne Bras, MD;  Location: [facility];  Service: Radiology;  Laterality: N/A;   ROBOT ASSISTED LAPAROSCOPIC RADICAL PROSTATECTOMY  01/31/2011   Robotic-assisted laparoscopic radical retropubic    Family History  Problem Relation Age of Onset   Atrial fibrillation Father        onset 89s. Had pacemaker placed for sinus pause   Prostate cancer Father    Breast cancer Mother    Lung cancer Mother    Leukemia Brother    Colon polyps Brother    Colon cancer Neg Hx    Diabetes Neg Hx     Medications- reviewed and updated Current Outpatient Medications  Medication Sig Dispense Refill   DM-Doxylamine-Acetaminophen 15-6.25-325 MG/15ML LIQD Take 30 mLs by mouth at bedtime as needed (cough/sleep).      furosemide (LASIX) 40 MG tablet Take 1 tablet (40 mg total) by mouth daily.     isosorbide-hydrALAZINE (BIDIL) 20-37.5 MG tablet Take 2 tablets by mouth 2 (two) times daily.      labetalol (NORMODYNE) 200 MG tablet Take 1 tablet (200 mg total) by mouth 2 (two) times daily. 60 tablet 6   Multiple Vitamin (MULTIVITAMIN WITH MINERALS) TABS tablet Take 1 tablet by mouth daily.     Rivaroxaban (XARELTO) 15 MG TABS tablet Take 1 tablet (15 mg total) by mouth daily with supper. 90 tablet 1   triamcinolone cream (KENALOG) 0.1 % Apply topically 2 (two) times daily. (Patient taking differently: Apply 1 application topically 2 (two) times daily as needed (rash).) 30 g 6   zolpidem (AMBIEN) 10 MG tablet TAKE 1 TABLET AT BEDTIME AS NEEDED FOR SLEEP 30 tablet 0   cloNIDine (CATAPRES) 0.1 MG tablet Take 1 tablet (0.1 mg total) by mouth daily. 90 tablet 3   ferrous sulfate (FEROSUL) 325 (65 FE) MG tablet Take 1 tablet (325 mg total) by mouth daily with breakfast. 90 tablet 3   folic acid (FOLVITE) 1 MG tablet Take 1 tablet (1 mg total) by mouth daily. 90 tablet 3   hydrOXYzine  (ATARAX/VISTARIL) 50 MG tablet Take 1 tablet (50 mg total) by mouth 3 (three) times daily as needed for anxiety (and insomnia). 180 tablet 1   Rivaroxaban (XARELTO) 15 MG TABS tablet Take 1 tablet (15 mg total) by mouth daily with supper. 21 tablet 0   sitaGLIPtin (JANUVIA) 100 MG tablet Take 1 tablet (100 mg total) by mouth daily. 90 tablet 3  thiamine 100 MG tablet Take 1 tablet (100 mg total) by mouth daily.     No current facility-administered medications for this visit.    Allergies-reviewed and updated Allergies  Allergen Reactions   Hydrochlorothiazide Other (See Comments)    Pt gets hyponatremia   Lasix [Furosemide] Other (See Comments)    Sodium levels drop when take   Nsaids Other (See Comments)    Kidney disease/failure   Sulfa Antibiotics Other (See Comments)    headaches    Social History   Socioeconomic History   Marital status: Married    Spouse name: Not on file   Number of children: 2   Years of education: Not on file   Highest education level: Not on file  Occupational History   Occupation: Press photographer and Proofreader  Tobacco Use   Smoking status: Former    Packs/day: 2.00    Years: 25.00    Pack years: 50.00    Types: Cigarettes    Quit date: 04/14/2014    Years since quitting: 7.0   Smokeless tobacco: Never  Vaping Use   Vaping Use: Never used  Substance and Sexual Activity   Alcohol use: Yes    Alcohol/week: 7.0 - 10.0 standard drinks    Types: 7 - 10 Standard drinks or equivalent per week    Comment: 2 bottles of wine with wife a week; cocktail every other night   Drug use: No   Sexual activity: Not Currently  Other Topics Concern   Not on file  Social History Narrative   Fun/Hobby: Walk, cards, gardening    Social Determinants of Health   Financial Resource Strain: Not on file  Food Insecurity: Not on file  Transportation Needs: Not on file  Physical Activity: Not on file  Stress: Not on file  Social Connections: Not on file         Objective:  Physical Exam: BP (!) 153/86   Pulse 80   Temp 98 F (36.7 C) (Temporal)   Ht _0  (1.753 m)   Wt 161 lb 12.8 oz (73.4 kg)   SpO2 99%   BMI 23.89 kg/m   Body mass index is 23.89 kg/m. Wt Readings from Last 3 Encounters:  05/16/21 161 lb 12.8 oz (73.4 kg)  05/11/20 159 lb (72.1 kg)  04/06/20 142 lb 6.4 oz (64.6 kg)   Gen: NAD, resting comfortably HEENT: TMs normal bilaterally. OP clear. No thyromegaly noted.  CV: RRR with no murmurs appreciated Pulm: NWOB, CTAB with no crackles, wheezes, or rhonchi GI: Normal bowel sounds present. Soft, Nontender, Nondistended. MSK: no edema, cyanosis, or clubbing noted Skin: warm, dry Neuro: CN2-12 grossly intact. Strength 5/5 in upper and lower extremities. Reflexes symmetric and intact bilaterally.  Psych: Normal affect and thought content      I,Savera Zaman,acting as a scribe for Dimas Chyle, MD.,have documented all relevant documentation on the behalf of Dimas Chyle, MD,as directed by  Dimas Chyle, MD while in the presence of Dimas Chyle, MD.   I, Dimas Chyle, MD, have reviewed all documentation for this visit. The documentation on 05/17/21 for the exam, diagnosis, procedures, and orders are all accurate and complete.  [name]. [name], MD 05/17/2021 9:04 AM

## 2021-05-16 NOTE — Assessment & Plan Note (Signed)
Above goal.  Typically well controlled.  Continue current regimen per cardiology.

## 2021-05-16 NOTE — Assessment & Plan Note (Signed)
On Januvia 100 mg daily.  Will refill today.  Check labs including A1c.

## 2021-05-16 NOTE — Assessment & Plan Note (Signed)
On Ambien.  We will restart hydroxyzine as well.  This is worked well for him in the past.

## 2021-05-16 NOTE — Assessment & Plan Note (Signed)
Follows with urology.  He has had PSA checked recently.

## 2021-05-17 NOTE — Progress Notes (Signed)
Please inform patient of the following:  LAbs are all stable compared to his baseline. Do not need to make any changes to his treatment plan at this time. We can recheck his A1c in 6 months.

## 2021-06-09 NOTE — Progress Notes (Signed)
Cardiology Office Note:    Date:  06/12/2021   ID:  Douglas Ballard, DOB Oct 19, 1950, MRN [id number]  PCP:  Vivi Barrack, MD  [facility] Cardiologist:  Daneya Hartgrove [place], MD  [facility] Electrophysiologist:  None   Referring MD: Vivi Barrack, MD   Chief Complaint  Patient presents with   Atrial Fibrillation     History of Present Illness:    Douglas Ballard is a 70 y.o. male with a hx of HTN, PAF, CKD, tobacco abuse, alcohol abuse, DM 2, and colovesical fistula.  He was admitted in August 2015 with acute renal failure, sepsis, expressive aphasia, diastolic heart failure in the setting of A. fib with RVR.  He also had alcohol withdrawal during the same hospitalization.  Echocardiogram demonstrated normal EF, moderate biatrial enlargement, mild LVH.  Myoview demonstrated normal perfusion.  He had recurrent atrial fibrillation in June 2017.  He was seen in the ED in June 2017 with chest pain and hematuria.  D-dimer was negative, cardiac enzymes were negative.  Repeat Myoview showed normal perfusion, EF 50%.  He was admitted for acute on chronic diastolic heart failure in January 2020 and underwent IV diuresis.  Creatinine trended up to 3.3 before trending back down. Echocardiogram obtained in 2020 showed EF 55 to 60%.   He was admitted to the hospital in July 2021 with acute on chronic diastolic heart failure.  Echocardiogram obtained on 03/14/2020 showed EF 55 to 68%, grade 3 diastolic dysfunction, moderate LAE, mild to moderate right atrial enlargement, mild MR.  Due to heme positive stool, Xarelto was held for a few days before resuming.  During the meantime, he underwent endoscopy and colonoscopy by GI service.  When seen in the office he was bradycardic with HR in the 40s and diltiazem dose was reduced. He is followed by Nephrology. On last visit here creatinine was up to 4.76. With reduction of lasix to 40 mg daily creatinine improved to 3.17.   On follow up today he is doing very  well. Denies any dyspnea, palpitations, dizziness, edema, orthopnea. Weight is stable.  He hasn't smoked in years and states his Etoh use is "way down"- about 1.5 bottles of wine a week. He does complain of chronic fatigue. Was getting regular injections for his anemia but this was too expensive for him.     Past Medical History:  Diagnosis Date   Alcohol withdrawal seizure ([place]) 06/28/2011   Alcohol withdraw seizure prior to admission is suspected from history given by family & Post ictal appearance in the ED.    Anxiety    Atrial fibrillation ([place])    Atypical mole 05/13/2008   LEFT MEDIAL CHEST SLIGHT/MOD.   Atypical mole 10/12/2020   Left Upper Arm-Posterior (moderate to severe)   Atypical mole 10/12/2020   Right Mid Back (Moderate)   Atypical nevi 05/13/2008   LEFT LATERAL CHEST SLIGHT   Atypical nevi 04/29/2015   RIGHT POST SHOULDER MOD/SEV. [place] W/S   Atypical nevi 04/29/2015   RIGHT MID ABDOMEN MODERATE [place] W/S   Atypical nevi 09/27/2015   LEFT UPPER ARM SEVERE TX W/S   Atypical nevi 09/27/2015   MID LOWER BACK MODERATE   Atypical nevi 09/27/2015   MID UPPER BACK SEVERE TX EXC   Atypical nevi 09/27/2015   LEFT UPPER BACK MOD.SEV TX EXC   Atypical nevi 03/23/2016   RIGHT LOWER BACK MILD   Atypical nevi 03/23/2016   LEFT SIDE TORSO MILD   Atypical nevi 06/14/2016   LEFT  LOWER BACK MOD/SEV    Atypical nevi 06/14/2016   LEFT CHEST MODERATE   Atypical nevi 08/30/2017   MID UPPER BACK SUP MODERATE   Atypical nevi 08/30/2017   RIGHT CHEST SEVERE    Atypical nevi 07/25/2018   LEFT SHOULDER ANTERIOR TX WIDERSHAVE   Atypical nevi 07/25/2018   LEFT SHOULDER POST MODERATE   Atypical nevi 08/25/2019   LEFT SIDE ABDOMEN MODERATE FREE   Atypical nevi 11/27/2019   RIGHT SHOULDER MODERATE TX W/S   Atypical nevi 11/27/2019   RIGHT UPPER BACK MOD/SEVERE TX W/S   Atypical nevi 11/27/2019   LEFT MID BACK SEVERE TX W/S   Cancer ([place]) 2012   Prostate surgery   Chronic diastolic  CHF (congestive heart failure) ([place]) 11/01/2014   Echo 8/15: Mild LVH, EF 50-55%, moderate BAE  //  b. Echo 7/17: EF 55-60%, normal wall motion, grade 2 diastolic dysfunction, MAC, mild MR, moderate LAE, trivial PI   Chronic kidney disease    Compression fracture of thoracic vertebra ([place]) 06/26/2011   Diabetes mellitus type 2 in nonobese (HCC)    Fistula, bladder    Frequent urination at night    History of adenomatous polyp of colon 06/14/2014   History of nuclear stress test    a. Myoview 10/15: Overall Impression:  Low risk stress nuclear study demonstrating mild baseline ST-T changes with normal myocardial perfusion and low normal EF of 50%. // b.Myoview 7/17: EF 50%, no ischemia or scar, low risk (EF normal by recent echo)   Hypertension    PNA (pneumonia) 06/26/2011   Sepsis due to urinary tract infection ([place]) 04/16/2014   Stroke ([place]) sept 1, 2015   tia x 3    Past Surgical History:  Procedure Laterality Date   BIOPSY  03/16/2020   Procedure: BIOPSY;  Surgeon: Lavena Bullion, DO;  Location: [facility];  Service: Gastroenterology;;   COLON SURGERY     COLONOSCOPY N/A 06/14/2014   Procedure: COLONOSCOPY;  Surgeon: Gatha Mayer, MD;  Location: [facility];  Service: Endoscopy;  Laterality: N/A;   COLONOSCOPY WITH PROPOFOL N/A 03/16/2020   Procedure: COLONOSCOPY WITH PROPOFOL;  Surgeon: Lavena Bullion, DO;  Location: [facility];  Service: Gastroenterology;  Laterality: N/A;   CYSTOSCOPY WITH STENT PLACEMENT Bilateral 06/15/2014   Procedure: CYSTOSCOPY WITH BILATERAL STENT PLACEMENT;  Surgeon: Bernestine Amass, MD;  Location: [facility];  Service: Urology;  Laterality: Bilateral;   ESOPHAGOGASTRODUODENOSCOPY (EGD) WITH PROPOFOL N/A 03/16/2020   Procedure: ESOPHAGOGASTRODUODENOSCOPY (EGD) WITH PROPOFOL;  Surgeon: Lavena Bullion, DO;  Location: [facility];  Service: Gastroenterology;  Laterality: N/A;   LIPOMA EXCISION  2012   Dr Nonah Mattes   moles removed     Back    POLYPECTOMY  03/16/2020   Procedure: POLYPECTOMY;  Surgeon: Lavena Bullion, DO;  Location: [facility];  Service: Gastroenterology;;   PROCTOSCOPY N/A 06/15/2014   Procedure: RIGID PROCTOSCOPY;  Surgeon: Michael Boston, MD;  Location: [facility];  Service: General;  Laterality: N/A;   RADIOLOGY WITH ANESTHESIA N/A 07/28/2014   Procedure: Embolization;  Surgeon: Luanne Bras, MD;  Location: [facility];  Service: Radiology;  Laterality: N/A;   ROBOT ASSISTED LAPAROSCOPIC RADICAL PROSTATECTOMY  01/31/2011   Robotic-assisted laparoscopic radical retropubic    Current Medications: Current Meds  Medication Sig   cloNIDine (CATAPRES) 0.1 MG tablet Take 1 tablet (0.1 mg total) by mouth daily.   DM-Doxylamine-Acetaminophen 15-6.25-325 MG/15ML LIQD Take 30 mLs by mouth at bedtime as needed (cough/sleep).  ferrous sulfate (FEROSUL) 325 (65 FE) MG tablet Take 1 tablet (325 mg total) by mouth daily with breakfast.   folic acid (FOLVITE) 1 MG tablet Take 1 tablet (1 mg total) by mouth daily.   furosemide (LASIX) 40 MG tablet Take 1 tablet (40 mg total) by mouth daily.   hydrOXYzine (ATARAX/VISTARIL) 50 MG tablet Take 1 tablet (50 mg total) by mouth 3 (three) times daily as needed for anxiety (and insomnia).   isosorbide-hydrALAZINE (BIDIL) 20-37.5 MG tablet Take 2 tablets by mouth 2 (two) times daily.    labetalol (NORMODYNE) 200 MG tablet Take 1 tablet (200 mg total) by mouth 2 (two) times daily.   Multiple Vitamin (MULTIVITAMIN WITH MINERALS) TABS tablet Take 1 tablet by mouth daily.   Rivaroxaban (XARELTO) 15 MG TABS tablet Take 1 tablet (15 mg total) by mouth daily with supper.   Rivaroxaban (XARELTO) 15 MG TABS tablet Take 1 tablet (15 mg total) by mouth daily with supper.   sitaGLIPtin (JANUVIA) 100 MG tablet Take 1 tablet (100 mg total) by mouth daily.   thiamine 100 MG tablet Take 1 tablet (100 mg total) by mouth daily.   triamcinolone cream (KENALOG) 0.1 % Apply topically 2 (two) times daily.  (Patient taking differently: Apply 1 application topically 2 (two) times daily as needed (rash).)   zolpidem (AMBIEN) 10 MG tablet TAKE 1 TABLET AT BEDTIME AS NEEDED FOR SLEEP     Allergies:   Hydrochlorothiazide, Lasix [furosemide], Nsaids, and Sulfa antibiotics   Social History   Socioeconomic History   Marital status: Married    Spouse name: Not on file   Number of children: 2   Years of education: Not on file   Highest education level: Not on file  Occupational History   Occupation: Press photographer and Proofreader  Tobacco Use   Smoking status: Former    Packs/day: 2.00    Years: 25.00    Pack years: 50.00    Types: Cigarettes    Quit date: 04/14/2014    Years since quitting: 7.1   Smokeless tobacco: Never  Vaping Use   Vaping Use: Never used  Substance and Sexual Activity   Alcohol use: Yes    Alcohol/week: 7.0 - 10.0 standard drinks    Types: 7 - 10 Standard drinks or equivalent per week    Comment: 2 bottles of wine with wife a week; cocktail every other night   Drug use: No   Sexual activity: Not Currently  Other Topics Concern   Not on file  Social History Narrative   Fun/Hobby: Walk, cards, gardening    Social Determinants of Health   Financial Resource Strain: Not on file  Food Insecurity: Not on file  Transportation Needs: Not on file  Physical Activity: Not on file  Stress: Not on file  Social Connections: Not on file     Family History: The patient's family history includes Atrial fibrillation in his father; Breast cancer in his mother; Colon polyps in his brother; Leukemia in his brother; Lung cancer in his mother; Prostate cancer in his father. There is no history of Colon cancer or Diabetes.  ROS:   Please see the history of present illness.     All other systems reviewed and are negative.  EKGs/Labs/Other Studies Reviewed:    The following studies were reviewed today:  Echo 03/14/2020 1. Left ventricular ejection fraction, by estimation, is 55  to 60%. The  left ventricle has normal function. The left ventricle has no regional  wall  motion abnormalities. There is moderate left ventricular hypertrophy.  Left ventricular diastolic  parameters are consistent with Grade III diastolic dysfunction  (restrictive). Elevated left atrial pressure.   2. Right ventricular systolic function is normal. The right ventricular  size is mildly enlarged. Tricuspid regurgitation signal is inadequate for  assessing PA pressure.   3. Left atrial size was moderately dilated.   4. Right atrial size was mild to moderately dilated.   5. The mitral valve is grossly normal. Mild mitral valve regurgitation.  No evidence of mitral stenosis.   6. The aortic valve is tricuspid. Aortic valve regurgitation is trivial.  No aortic stenosis is present.   Comparison(s): No significant change from prior study.   EKG:  EKG is not ordered today.     Recent Labs: 05/16/2021: ALT 9; BUN 54; Creatinine, Ser 3.26; Hemoglobin 10.6; Platelets 196.0; Potassium 4.0; Sodium 132; TSH 2.11  Recent Lipid Panel    Component Value Date/Time   CHOL 107 05/16/2021 1147   TRIG 28.0 05/16/2021 1147   HDL 60.80 05/16/2021 1147   CHOLHDL 2 05/16/2021 1147   VLDL 5.6 05/16/2021 1147   LDLCALC 41 05/16/2021 1147    Physical Exam:    VS:  BP (!) 146/78   Pulse 95   Ht _0  (1.753 m)   Wt 159 lb (72.1 kg)   SpO2 98%   BMI 23.48 kg/m     Wt Readings from Last 3 Encounters:  06/12/21 159 lb (72.1 kg)  05/16/21 161 lb 12.8 oz (73.4 kg)  05/11/20 159 lb (72.1 kg)     GEN:  Well nourished, well developed in no acute distress HEENT: Normal NECK: No JVD; No carotid bruits LYMPHATICS: No lymphadenopathy CARDIAC: IRRR, no murmurs, rubs, gallops RESPIRATORY:  Clear to auscultation without rales, wheezing or rhonchi  ABDOMEN: Soft, non-tender, non-distended MUSCULOSKELETAL:  No edema; No deformity  SKIN: Warm and dry NEUROLOGIC:  Alert and oriented x 3 PSYCHIATRIC:   Normal affect   ASSESSMENT:    1. Longstanding persistent atrial fibrillation ([place])   2. Chronic diastolic CHF (congestive heart failure) ([place])   3. CKD stage G4/A1, GFR 15-29 and albumin creatinine ratio <30 mg/g (HCC)   4. Fatigue, unspecified type   5. Essential hypertension     PLAN:    In order of problems listed above:  Chronic diastolic CHF. Appears to be well compensated on lasix 40 mg daily. Weight is stable and no edema. Continue current therapy. Sodium restriction.   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   PAF: appears to be persistent. Rate is well controlled. He is asymptomatic. Previously he was bradycardic in NSR and diltiazem was discontinued.  He is on low-dose Xarelto.  Continue to manage with rate control.   Hypertension: Blood pressure stable on current therapy.  DM2: Managed by primary care provider  6.   Fatigue - multifactorial with Afib, BP meds, anemia.      Medication Adjustments/Labs and Tests Ordered: Current medicines are reviewed at length with the patient today.  Concerns regarding medicines are outlined above.  No orders of the defined types were placed in this encounter.  No orders of the defined types were placed in this encounter.   There are no Patient Instructions on file for this visit.   Signed, Tommie Bohlken [place], MD  06/12/2021 11:00 AM    [facility]

## 2021-06-12 ENCOUNTER — Other Ambulatory Visit: Payer: Self-pay

## 2021-06-12 ENCOUNTER — Ambulatory Visit: Payer: Medicare HMO | Admitting: Cardiology

## 2021-06-12 ENCOUNTER — Encounter: Payer: Self-pay | Admitting: Cardiology

## 2021-06-12 VITALS — BP 146/78 | HR 95 | Ht 69.0 in | Wt 159.0 lb

## 2021-06-12 DIAGNOSIS — R5383 Other fatigue: Secondary | ICD-10-CM | POA: Diagnosis not present

## 2021-06-12 DIAGNOSIS — N184 Chronic kidney disease, stage 4 (severe): Secondary | ICD-10-CM

## 2021-06-12 DIAGNOSIS — I4811 Longstanding persistent atrial fibrillation: Secondary | ICD-10-CM

## 2021-06-12 DIAGNOSIS — I10 Essential (primary) hypertension: Secondary | ICD-10-CM | POA: Diagnosis not present

## 2021-06-12 DIAGNOSIS — I5032 Chronic diastolic (congestive) heart failure: Secondary | ICD-10-CM

## 2021-06-30 ENCOUNTER — Other Ambulatory Visit: Payer: Self-pay | Admitting: Family Medicine

## 2021-07-06 DIAGNOSIS — N184 Chronic kidney disease, stage 4 (severe): Secondary | ICD-10-CM | POA: Diagnosis not present

## 2021-07-10 DIAGNOSIS — E1122 Type 2 diabetes mellitus with diabetic chronic kidney disease: Secondary | ICD-10-CM | POA: Diagnosis not present

## 2021-07-10 DIAGNOSIS — N184 Chronic kidney disease, stage 4 (severe): Secondary | ICD-10-CM | POA: Diagnosis not present

## 2021-07-10 DIAGNOSIS — D631 Anemia in chronic kidney disease: Secondary | ICD-10-CM | POA: Diagnosis not present

## 2021-07-10 DIAGNOSIS — I129 Hypertensive chronic kidney disease with stage 1 through stage 4 chronic kidney disease, or unspecified chronic kidney disease: Secondary | ICD-10-CM | POA: Diagnosis not present

## 2021-07-11 ENCOUNTER — Other Ambulatory Visit: Payer: Self-pay | Admitting: Family Medicine

## 2021-07-11 DIAGNOSIS — F5101 Primary insomnia: Secondary | ICD-10-CM

## 2021-08-03 ENCOUNTER — Other Ambulatory Visit: Payer: Self-pay | Admitting: Family Medicine

## 2021-08-20 ENCOUNTER — Other Ambulatory Visit: Payer: Self-pay | Admitting: Family Medicine

## 2021-09-25 ENCOUNTER — Other Ambulatory Visit: Payer: Self-pay

## 2021-09-25 ENCOUNTER — Ambulatory Visit (INDEPENDENT_AMBULATORY_CARE_PROVIDER_SITE_OTHER): Payer: Medicare HMO

## 2021-09-25 DIAGNOSIS — Z0000 Encounter for general adult medical examination without abnormal findings: Secondary | ICD-10-CM

## 2021-09-25 NOTE — Progress Notes (Addendum)
Virtual Visit via Telephone Note  I connected with  Douglas Ballard on 09/25/21 at 11:00 AM EST by telephone and verified that I am speaking with the correct person using two identifiers.  Medicare Annual Wellness visit completed telephonically due to Covid-19 pandemic.   Persons participating in this call: This Health Coach and this patient.   Location: Patient: Home Provider: Office    I discussed the limitations, risks, security and privacy concerns of performing an evaluation and management service by telephone and the availability of in person appointments. The patient expressed understanding and agreed to proceed.  Unable to perform video visit due to video visit attempted and failed and/or patient does not have video capability.   Some vital signs may be absent or patient reported.   Willette Brace, LPN   Subjective:   Douglas Ballard is a 71 y.o. male who presents for Medicare Annual/Subsequent preventive examination.  Review of Systems     Cardiac Risk Factors include: advanced age (>40mn, >>33women);hypertension;diabetes mellitus;male gender     Objective:    There were no vitals filed for this visit. There is no height or weight on file to calculate BMI.  Advanced Directives 09/25/2021 03/16/2020 03/14/2020 11/25/2018 08/31/2018 08/31/2018 04/19/2017  Does Patient Have a Medical Advance Directive? Yes Yes No Yes Yes Yes No  Type of AAcademic librarian- - Healthcare Power of AFreescale SemiconductorPower of APerdido BeachLiving will HMount ClareLiving will -  Does patient want to make changes to medical advance directive? - - - No - Patient declined No - Patient declined - -  Copy of [place] Chart? No - copy requested - - No - copy requested No - copy requested No - copy requested -  Would patient like information on creating a medical advance directive? - No - Patient declined No - Patient declined - - - -   Pre-existing out of facility DNR order (yellow form or pink MOST form) - - - - - - -    Current Medications (verified) Outpatient Encounter Medications as of 09/25/2021  Medication Sig   cloNIDine (CATAPRES) 0.1 MG tablet Take 1 tablet (0.1 mg total) by mouth daily.   FEROSUL 325 (65 Fe) MG tablet TAKE 1 TABLET EVERY DAY (SUBSTITUTED FOR FEROSUL)   folic acid (FOLVITE) 1 MG tablet Take 1 tablet (1 mg total) by mouth daily.   furosemide (LASIX) 40 MG tablet Take 1 tablet (40 mg total) by mouth daily.   hydrOXYzine (ATARAX) 50 MG tablet TAKE 1 TABLET THREE TIMES DAILY AS NEEDED FOR ANXIETY (AND INSOMNIA).   isosorbide-hydrALAZINE (BIDIL) 20-37.5 MG tablet Take 2 tablets by mouth 2 (two) times daily.    labetalol (NORMODYNE) 200 MG tablet Take 1 tablet (200 mg total) by mouth 2 (two) times daily.   Multiple Vitamin (MULTIVITAMIN WITH MINERALS) TABS tablet Take 1 tablet by mouth daily.   pantoprazole (PROTONIX) 40 MG tablet TAKE 1 TABLET TWICE DAILY   Rivaroxaban (XARELTO) 15 MG TABS tablet Take 1 tablet (15 mg total) by mouth daily with supper.   sitaGLIPtin (JANUVIA) 100 MG tablet Take 1 tablet (100 mg total) by mouth daily.   thiamine 100 MG tablet Take 1 tablet (100 mg total) by mouth daily.   zolpidem (AMBIEN) 10 MG tablet TAKE 1 TABLET AT BEDTIME AS NEEDED FOR SLEEP (NEED OFFICE VISIT)   DM-Doxylamine-Acetaminophen 15-6.25-325 MG/15ML LIQD Take 30 mLs by mouth at bedtime as needed (cough/sleep).  (Patient not  taking: Reported on 09/25/2021)   triamcinolone cream (KENALOG) 0.1 % Apply topically 2 (two) times daily. (Patient not taking: Reported on 09/25/2021)   [DISCONTINUED] Rivaroxaban (XARELTO) 15 MG TABS tablet Take 1 tablet (15 mg total) by mouth daily with supper.   No facility-administered encounter medications on file as of 09/25/2021.    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    Anxiety    Atrial fibrillation ([place])    Atypical mole 05/13/2008   LEFT MEDIAL CHEST SLIGHT/MOD.   Atypical mole 10/12/2020   Left Upper Arm-Posterior (moderate to severe)   Atypical mole 10/12/2020   Right Mid Back (Moderate)   Atypical nevi 05/13/2008   LEFT LATERAL CHEST SLIGHT   Atypical nevi 04/29/2015   RIGHT POST SHOULDER MOD/SEV. [place] W/S   Atypical nevi 04/29/2015   RIGHT MID ABDOMEN MODERATE [place] W/S   Atypical nevi 09/27/2015   LEFT UPPER ARM SEVERE TX W/S   Atypical nevi 09/27/2015   MID LOWER BACK MODERATE   Atypical nevi 09/27/2015   MID UPPER BACK SEVERE TX EXC   Atypical nevi 09/27/2015   LEFT UPPER BACK MOD.SEV TX EXC   Atypical nevi 03/23/2016   RIGHT LOWER BACK MILD   Atypical nevi 03/23/2016   LEFT SIDE TORSO MILD   Atypical nevi 06/14/2016   LEFT LOWER BACK MOD/SEV    Atypical nevi 06/14/2016   LEFT CHEST MODERATE   Atypical nevi 08/30/2017   MID UPPER BACK SUP MODERATE   Atypical nevi 08/30/2017   RIGHT CHEST SEVERE    Atypical nevi 07/25/2018   LEFT SHOULDER ANTERIOR TX WIDERSHAVE   Atypical nevi 07/25/2018   LEFT SHOULDER POST MODERATE   Atypical nevi 08/25/2019   LEFT SIDE ABDOMEN MODERATE FREE   Atypical nevi 11/27/2019   RIGHT SHOULDER MODERATE TX W/S   Atypical nevi 11/27/2019   RIGHT UPPER BACK MOD/SEVERE TX W/S   Atypical nevi 11/27/2019   LEFT MID BACK SEVERE TX W/S   Cancer ([place]) 2012   Prostate surgery   Chronic diastolic CHF (congestive heart failure) ([place]) 11/01/2014   Echo 8/15: Mild LVH, EF 50-55%, moderate BAE  //  b. Echo 7/17: EF 55-60%, normal wall motion, grade 2 diastolic dysfunction, MAC, mild MR, moderate LAE, trivial PI   Chronic kidney disease    Compression fracture of thoracic vertebra ([place]) 06/26/2011   Diabetes mellitus type 2 in nonobese (HCC)     Fistula, bladder    Frequent urination at night    History of adenomatous polyp of colon 06/14/2014   History of nuclear stress test    a. Myoview 10/15: Overall Impression:  Low risk stress nuclear study demonstrating mild baseline ST-T changes with normal myocardial perfusion and low normal EF of 50%. // b.Myoview 7/17: EF 50%, no ischemia or scar, low risk (EF normal by recent echo)   Hypertension    PNA (pneumonia) 06/26/2011   Sepsis due to urinary tract infection ([place]) 04/16/2014   Stroke ([place]) sept 1, 2015   tia x 3   Past Surgical History:  Procedure Laterality Date   BIOPSY  03/16/2020   Procedure: BIOPSY;  Surgeon: Lavena Bullion, DO;  Location: [facility];  Service: Gastroenterology;;   COLON SURGERY     COLONOSCOPY N/A  06/14/2014   Procedure: COLONOSCOPY;  Surgeon: Gatha Mayer, MD;  Location: [facility];  Service: Endoscopy;  Laterality: N/A;   COLONOSCOPY WITH PROPOFOL N/A 03/16/2020   Procedure: COLONOSCOPY WITH PROPOFOL;  Surgeon: Lavena Bullion, DO;  Location: [facility];  Service: Gastroenterology;  Laterality: N/A;   CYSTOSCOPY WITH STENT PLACEMENT Bilateral 06/15/2014   Procedure: CYSTOSCOPY WITH BILATERAL STENT PLACEMENT;  Surgeon: Bernestine Amass, MD;  Location: [facility];  Service: Urology;  Laterality: Bilateral;   ESOPHAGOGASTRODUODENOSCOPY (EGD) WITH PROPOFOL N/A 03/16/2020   Procedure: ESOPHAGOGASTRODUODENOSCOPY (EGD) WITH PROPOFOL;  Surgeon: Lavena Bullion, DO;  [facility];  Service: Gastroenterology;  Laterality: N/A;   LIPOMA EXCISION  2012   Dr Nonah Mattes   moles removed     Back   POLYPECTOMY  03/16/2020   Procedure: POLYPECTOMY;  Surgeon: Lavena Bullion, DO;  [facility];  Service: Gastroenterology;;   PROCTOSCOPY N/A 06/15/2014   Procedure: RIGID PROCTOSCOPY;  Surgeon: Michael Boston, MD;  Location: [facility];  Service: General;  Laterality: N/A;   RADIOLOGY WITH ANESTHESIA N/A 07/28/2014   Procedure: Embolization;   Surgeon: Luanne Bras, MD;  Location: [place];  Service: Radiology;  Laterality: N/A;   ROBOT ASSISTED LAPAROSCOPIC RADICAL PROSTATECTOMY  01/31/2011   Robotic-assisted laparoscopic radical retropubic   Family History  Problem Relation Age of Onset   Atrial fibrillation Father        onset 72s. Had pacemaker placed for sinus pause   Prostate cancer Father    Breast cancer Mother    Lung cancer Mother    Leukemia Brother    Colon polyps Brother    Colon cancer Neg Hx    Diabetes Neg Hx    Social History   Socioeconomic History   Marital status: Married    Spouse name: Not on file   Number of children: 2   Years of education: Not on file   Highest education level: Not on file  Occupational History   Occupation: Press photographer and Proofreader  Tobacco Use   Smoking status: Former    Packs/day: 2.00    Years: 25.00    Pack years: 50.00    Types: Cigarettes    Quit date: 04/14/2014    Years since quitting: 7.4   Smokeless tobacco: Never  Vaping Use   Vaping Use: Never used  Substance and Sexual Activity   Alcohol use: Yes    Alcohol/week: 7.0 - 10.0 standard drinks    Types: 7 - 10 Standard drinks or equivalent per week    Comment: 2 bottles of wine with wife a week; cocktail every other night   Drug use: No   Sexual activity: Not Currently  Other Topics Concern   Not on file  Social History Narrative   Fun/Hobby: Walk, cards, gardening    Social Determinants of Health   Financial Resource Strain: Low Risk    Difficulty of Paying Living Expenses: Not hard at all  Food Insecurity: No Food Insecurity   Worried About Charity fundraiser in the Last Year: Never true   Ran Out of Food in the Last Year: Never true  Transportation Needs: No Transportation Needs   Lack of Transportation (Medical): No   Lack of Transportation (Non-Medical): No  Physical Activity: Sufficiently Active   Days of Exercise per Week: 6 days   Minutes of Exercise per Session: 30 min  Stress:  No Stress Concern Present   Feeling of Stress : Not at all  Social  Connections: Moderately Isolated   Frequency of Communication with Friends and Family: More than three times a week   Frequency of Social Gatherings with Friends and Family: More than three times a week   Attends Religious Services: Never   Marine scientist or Organizations: No   Attends Music therapist: Never   Marital Status: Married    Tobacco Counseling Counseling given: Not Answered   Clinical Intake:  Pre-visit preparation completed: Yes  Pain : No/denies pain     BMI - recorded: 23.48 Nutritional Status: BMI of 19-24  Normal Nutritional Risks: None Diabetes: Yes CBG done?: No Did pt. bring in CBG monitor from home?: No  How often do you need to have someone help you when you read instructions, pamphlets, or other written materials from your doctor or pharmacy?: 1 - Never  Diabetic?Nutrition Risk Assessment:  Has the patient had any N/V/D within the last 2 months?  No  Does the patient have any non-healing wounds?  No  Has the patient had any unintentional weight loss or weight gain?  No   Diabetes:  Is the patient diabetic?  Yes  If diabetic, was a CBG obtained today?  No  Did the patient bring in their glucometer from home?  No  How often do you monitor your CBG's? N/a.   Financial Strains and Diabetes Management:  Are you having any financial strains with the device, your supplies or your medication? No .  Does the patient want to be seen by Chronic Care Management for management of their diabetes?  No  Would the patient like to be referred to a Nutritionist or for Diabetic Management?  No   Diabetic Exams:  Diabetic Eye Exam: Overdue for diabetic eye exam. Pt has been advised about the importance in completing this exam. Patient advised to call and schedule an eye exam. Diabetic Foot Exam: Overdue, Pt has been advised about the importance in completing this exam. Pt  is scheduled for diabetic foot exam on next appt .   Interpreter Needed?: No  Information entered by :: Charlott Rakes, LPN   Activities of Daily Living In your present state of health, do you have any difficulty performing the following activities: 09/25/2021  Hearing? N  Vision? N  Difficulty concentrating or making decisions? N  Walking or climbing stairs? N  Dressing or bathing? N  Doing errands, shopping? N  Preparing Food and eating ? N  Using the Toilet? N  In the past six months, have you accidently leaked urine? N  Do you have problems with loss of bowel control? N  Managing your Medications? N  Managing your Finances? N  Housekeeping or managing your Housekeeping? N  Some recent data might be hidden    Patient Care Team: Vivi Barrack, MD as PCP - General (Family Medicine) [place], Peter M, MD as PCP - Cardiology (Cardiology) Rana Snare, MD (Inactive) as Consulting Physician (Urology) Michael Boston, MD as Consulting Physician (General Surgery) Susa Day, MD as Consulting Physician (Orthopedic Surgery) Gatha Mayer, MD as Consulting Physician (Gastroenterology) Nwobu, Lyman Bishop, MD as Consulting Physician (Nephrology) Lavonna Monarch, MD as Consulting Physician (Dermatology) Warren Danes, PA-C as Physician Assistant (Dermatology)  Indicate any recent Medical Services you may have received from other than [facility] providers in the past year (date may be approximate).     Assessment:   This is a routine wellness examination for Douglas Ballard.  Hearing/Vision screen Hearing Screening - Comments:: Pt denies any hearing  Vision Screening - Comments:: Pt follows up with provider across [facility]   Dietary issues and exercise activities discussed: Current Exercise Habits: Home exercise routine, Type of exercise: walking, Time (Minutes): 30, Frequency (Times/Week): 6, Weekly Exercise (Minutes/Week): 180   Goals Addressed             This Visit's  Progress    Patient Stated       Stay active and alive        Depression Screen PHQ 2/9 Scores 09/25/2021 05/16/2021 06/29/2019 11/24/2018 05/28/2017  PHQ - 2 Score 0 0 0 0 0  PHQ- 9 Score - - 0 - -    Fall Risk Fall Risk  09/25/2021 05/16/2021 03/22/2020 06/29/2019 05/28/2017  Falls in the past year? 0 0 0 0 Yes  Number falls in past yr: 0 0 - - 1  Injury with Fall? 0 0 - - Yes  Risk for fall due to : Impaired vision No Fall Risks No Fall Risks - -  Follow up Falls prevention discussed - - - Falls evaluation completed    FALL RISK PREVENTION PERTAINING TO THE HOME:  Any stairs in or around the home? Yes  If so, are there any without handrails? No  Home free of loose throw rugs in walkways, pet beds, electrical cords, etc? Yes  Adequate lighting in your home to reduce risk of falls? Yes   ASSISTIVE DEVICES UTILIZED TO PREVENT FALLS:  Life alert? No  Use of a cane, walker or w/c? No  Grab bars in the bathroom? No  Shower chair or bench in shower? No  Elevated toilet seat or a handicapped toilet? No   TIMED UP AND GO:  Was the test performed? No .  Cognitive Function:     6CIT Screen 09/25/2021  What Year? 0 points  What month? 0 points  What time? 0 points  Count back from 20 0 points  Months in reverse 0 points  Repeat phrase 0 points  Total Score 0    Immunizations Immunization History  Administered Date(s) Administered   Fluad Quad(high Dose 65+) 05/25/2019, 06/06/2020, 05/16/2021   Influenza Split 06/27/2011   Influenza, High Dose Seasonal PF 05/28/2017, 06/11/2018   PFIZER(Purple Top)SARS-COV-2 Vaccination 09/28/2019, 10/23/2019, 06/06/2020   Pneumococcal Conjugate-13 05/28/2017   Pneumococcal Polysaccharide-23 06/27/2011   Tdap 10/27/2013, 04/25/2017   Zoster, Live 10/27/2013    TDAP status: Up to date  Flu Vaccine status: Up to date  Pneumococcal vaccine status: Due, Education has been provided regarding the importance of this vaccine. Advised may receive  this vaccine at local pharmacy or Health Dept. Aware to provide a copy of the vaccination record if obtained from local pharmacy or Health Dept. Verbalized acceptance and understanding.  Covid-19 vaccine status: Completed vaccines  Qualifies for Shingles Vaccine? Yes   Zostavax completed Yes   Shingrix Completed?: No.    Education has been provided regarding the importance of this vaccine. Patient has been advised to call insurance company to determine out of pocket expense if they have not yet received this vaccine. Advised may also receive vaccine at local pharmacy or Health Dept. Verbalized acceptance and understanding.  Screening Tests Health Maintenance  Topic Date Due   Zoster Vaccines- Shingrix (1 of 2) Never done   Pneumonia Vaccine 14+ Years old (3 - PPSV23 if available, else PCV20) 05/28/2018   OPHTHALMOLOGY EXAM  06/12/2019   FOOT EXAM  08/02/2019   COVID-19 Vaccine (4 - Booster for Pfizer series) 08/01/2020   HEMOGLOBIN  A1C  11/13/2021   TETANUS/TDAP  04/26/2027   COLONOSCOPY (Pts 45-44yr Insurance coverage will need to be confirmed)  03/16/2030   INFLUENZA VACCINE  Completed   Hepatitis C Screening  Completed   HPV VACCINES  Aged Out    Health Maintenance  Health Maintenance Due  Topic Date Due   Zoster Vaccines- Shingrix (1 of 2) Never done   Pneumonia Vaccine 71 Years old (343- PPSV23 if available, else PCV20) 05/28/2018   OPHTHALMOLOGY EXAM  06/12/2019   FOOT EXAM  08/02/2019   COVID-19 Vaccine (4 - Booster for Pfizer series) 08/01/2020    Colorectal cancer screening: Type of screening: Colonoscopy. Completed 03/16/20. Repeat every 10 years   Additional Screening:  Hepatitis C Screening:  Completed 05/28/17  Vision Screening: Recommended annual ophthalmology exams for early detection of glaucoma and other disorders of the eye. Is the patient up to date with their annual eye exam?  No  Who is the provider or what is the name of the office in which the patient  attends annual eye exams? Unsure of providers name  If pt is not established with a provider, would they like to be referred to a provider to establish care? No .   Dental Screening: Recommended annual dental exams for proper oral hygiene  Community Resource Referral / Chronic Care Management: CRR required this visit?  No   CCM required this visit?  No      Plan:     I have personally reviewed and noted the following in the patients chart:   Medical and social history Use of alcohol, tobacco or illicit drugs  Current medications and supplements including opioid prescriptions. Patient is not currently taking opioid prescriptions. Functional ability and status Nutritional status Physical activity Advanced directives List of other physicians Hospitalizations, surgeries, and ER visits in previous 12 months Vitals Screenings to include cognitive, depression, and falls Referrals and appointments  In addition, I have reviewed and discussed with patient certain preventive protocols, quality metrics, and best practice recommendations. A written personalized care plan for preventive services as well as general preventive health recommendations were provided to patient.     TWillette Brace LPN   23/02/9431  Nurse Notes: None

## 2021-09-25 NOTE — Patient Instructions (Signed)
[name] , Thank you for taking time to come for your Medicare Wellness Visit. I appreciate your ongoing commitment to your health goals. Please review the following plan we discussed and let me know if I can assist you in the future.   Screening recommendations/referrals: Colonoscopy: done 03/16/20  repeat every 10 years  Recommended yearly ophthalmology/optometry visit for glaucoma screening and checkup Recommended yearly dental visit for hygiene and checkup  Vaccinations: Influenza vaccine: Done 05/16/21 repeat every year  Pneumococcal vaccine: Due and discussed  Tdap vaccine: Done 04/25/17 repeat very 10 years  Shingles vaccine: Shingrix discussed. Please contact your pharmacy for coverage information.    Covid-19: Completed 2/8, 3/5, & 06/06/20   Advanced directives: Please bring a copy of your health care power of attorney and living will to the office at your convenience.  Conditions/risks identified: stay active and live long   Next appointment: Follow up in one year for your annual wellness visit.   Preventive Care 71 Years and Older, Male Preventive care refers to lifestyle choices and visits with your health care provider that can promote health and wellness. What does preventive care include? A yearly physical exam. This is also called an annual well check. Dental exams once or twice a year. Routine eye exams. Ask your health care provider how often you should have your eyes checked. Personal lifestyle choices, including: Daily care of your teeth and gums. Regular physical activity. Eating a healthy diet. Avoiding tobacco and drug use. Limiting alcohol use. Practicing safe sex. Taking low doses of aspirin every day. Taking vitamin and mineral supplements as recommended by your health care provider. What happens during an annual well check? The services and screenings done by your health care provider during your annual well check will depend on your age, overall health,  lifestyle risk factors, and family history of disease. Counseling  Your health care provider may ask you questions about your: Alcohol use. Tobacco use. Drug use. Emotional well-being. Home and relationship well-being. Sexual activity. Eating habits. History of falls. Memory and ability to understand (cognition). Work and work Statistician. Screening  You may have the following tests or measurements: Height, weight, and BMI. Blood pressure. Lipid and cholesterol levels. These may be checked every 5 years, or more frequently if you are over 45 years old. Skin check. Lung cancer screening. You may have this screening every year starting at age 50 if you have a 30-pack-year history of smoking and currently smoke or have quit within the past 15 years. Fecal occult blood test (FOBT) of the stool. You may have this test every year starting at age 37. Flexible sigmoidoscopy or colonoscopy. You may have a sigmoidoscopy every 5 years or a colonoscopy every 10 years starting at age 71. Prostate cancer screening. Recommendations will vary depending on your family history and other risks. Hepatitis C blood test. Hepatitis B blood test. Sexually transmitted disease (STD) testing. Diabetes screening. This is done by checking your blood sugar (glucose) after you have not eaten for a while (fasting). You may have this done every 1-3 years. Abdominal aortic aneurysm (AAA) screening. You may need this if you are a current or former smoker. Osteoporosis. You may be screened starting at age 71 if you are at high risk. Talk with your health care provider about your test results, treatment options, and if necessary, the need for more tests. Vaccines  Your health care provider may recommend certain vaccines, such as: Influenza vaccine. This is recommended every year. Tetanus, diphtheria,  and acellular pertussis (Tdap, Td) vaccine. You may need a Td booster every 10 years. Zoster vaccine. You may need this  after age 71. Pneumococcal 13-valent conjugate (PCV13) vaccine. One dose is recommended after age 6. Pneumococcal polysaccharide (PPSV23) vaccine. One dose is recommended after age 71. Talk to your health care provider about which screenings and vaccines you need and how often you need them. This information is not intended to replace advice given to you by your health care provider. Make sure you discuss any questions you have with your health care provider. Document Released: 09/02/2015 Document Revised: 04/25/2016 Document Reviewed: 06/07/2015 Elsevier Interactive Patient Education  2017 [employer] Prevention in the Home Falls can cause injuries. They can happen to people of all ages. There are many things you can do to make your home safe and to help prevent falls. What can I do on the outside of my home? Regularly fix the edges of walkways and driveways and fix any cracks. Remove anything that might make you trip as you walk through a door, such as a raised step or threshold. Trim any bushes or trees on the path to your home. Use bright outdoor lighting. Clear any walking paths of anything that might make someone trip, such as rocks or tools. Regularly check to see if handrails are loose or broken. Make sure that both sides of any steps have handrails. Any raised decks and porches should have guardrails on the edges. Have any leaves, snow, or ice cleared regularly. Use sand or salt on walking paths during winter. Clean up any spills in your garage right away. This includes oil or grease spills. What can I do in the bathroom? Use night lights. Install grab bars by the toilet and in the tub and shower. Do not use towel bars as grab bars. Use non-skid mats or decals in the tub or shower. If you need to sit down in the shower, use a plastic, non-slip stool. Keep the floor dry. Clean up any water that spills on the floor as soon as it happens. Remove soap buildup in the tub or  shower regularly. Attach bath mats securely with double-sided non-slip rug tape. Do not have throw rugs and other things on the floor that can make you trip. What can I do in the bedroom? Use night lights. Make sure that you have a light by your bed that is easy to reach. Do not use any sheets or blankets that are too big for your bed. They should not hang down onto the floor. Have a firm chair that has side arms. You can use this for support while you get dressed. Do not have throw rugs and other things on the floor that can make you trip. What can I do in the kitchen? Clean up any spills right away. Avoid walking on wet floors. Keep items that you use a lot in easy-to-reach places. If you need to reach something above you, use a strong step stool that has a grab bar. Keep electrical cords out of the way. Do not use floor polish or wax that makes floors slippery. If you must use wax, use non-skid floor wax. Do not have throw rugs and other things on the floor that can make you trip. What can I do with my stairs? Do not leave any items on the stairs. Make sure that there are handrails on both sides of the stairs and use them. Fix handrails that are broken or loose. Make sure  that handrails are as long as the stairways. Check any carpeting to make sure that it is firmly attached to the stairs. Fix any carpet that is loose or worn. Avoid having throw rugs at the top or bottom of the stairs. If you do have throw rugs, attach them to the floor with carpet tape. Make sure that you have a light switch at the top of the stairs and the bottom of the stairs. If you do not have them, ask someone to add them for you. What else can I do to help prevent falls? Wear shoes that: Do not have high heels. Have rubber bottoms. Are comfortable and fit you well. Are closed at the toe. Do not wear sandals. If you use a stepladder: Make sure that it is fully opened. Do not climb a closed stepladder. Make  sure that both sides of the stepladder are locked into place. Ask someone to hold it for you, if possible. Clearly mark and make sure that you can see: Any grab bars or handrails. First and last steps. Where the edge of each step is. Use tools that help you move around (mobility aids) if they are needed. These include: Canes. Walkers. Scooters. Crutches. Turn on the lights when you go into a dark area. Replace any light bulbs as soon as they burn out. Set up your furniture so you have a clear path. Avoid moving your furniture around. If any of your floors are uneven, fix them. If there are any pets around you, be aware of where they are. Review your medicines with your doctor. Some medicines can make you feel dizzy. This can increase your chance of falling. Ask your doctor what other things that you can do to help prevent falls. This information is not intended to replace advice given to you by your health care provider. Make sure you discuss any questions you have with your health care provider. Document Released: 06/02/2009 Document Revised: 01/12/2016 Document Reviewed: 09/10/2014 Elsevier Interactive Patient Education  2017 [employer].

## 2021-10-09 ENCOUNTER — Encounter: Payer: Self-pay | Admitting: Family Medicine

## 2021-10-10 ENCOUNTER — Other Ambulatory Visit: Payer: Self-pay

## 2021-10-10 MED ORDER — SITAGLIPTIN PHOSPHATE 100 MG PO TABS: 100.0000 mg | ORAL_TABLET | Freq: Every day | ORAL | 3 refills | Status: DC

## 2021-10-10 NOTE — Telephone Encounter (Signed)
Pt verified DOB; confirmed wanting Rx sent through [facility], advised Rx sent and we would see pt next week for his appt.

## 2021-10-16 ENCOUNTER — Encounter: Payer: Self-pay | Admitting: Family Medicine

## 2021-10-16 ENCOUNTER — Ambulatory Visit (INDEPENDENT_AMBULATORY_CARE_PROVIDER_SITE_OTHER): Payer: Medicare HMO | Admitting: Family Medicine

## 2021-10-16 ENCOUNTER — Other Ambulatory Visit: Payer: Self-pay

## 2021-10-16 VITALS — BP 167/102 | HR 97 | Temp 98.4°F | Ht 69.0 in | Wt 156.2 lb

## 2021-10-16 DIAGNOSIS — E1159 Type 2 diabetes mellitus with other circulatory complications: Secondary | ICD-10-CM | POA: Diagnosis not present

## 2021-10-16 DIAGNOSIS — I152 Hypertension secondary to endocrine disorders: Secondary | ICD-10-CM

## 2021-10-16 DIAGNOSIS — G4700 Insomnia, unspecified: Secondary | ICD-10-CM

## 2021-10-16 DIAGNOSIS — R202 Paresthesia of skin: Secondary | ICD-10-CM | POA: Diagnosis not present

## 2021-10-16 MED ORDER — ESZOPICLONE 3 MG PO TABS: 3.0000 mg | ORAL_TABLET | Freq: Every day | ORAL | 0 refills | Status: AC

## 2021-10-16 NOTE — Assessment & Plan Note (Signed)
No longer well controlled on Ambien.  We discussed treatment options.  We will start Lunesta.  He will send a message in a few weeks via MyChart to let us know how he is doing.

## 2021-10-16 NOTE — Progress Notes (Signed)
° °  Douglas Ballard is a 71 y.o. male who presents today for an office visit.  Assessment/Plan:  Chronic Problems Addressed Today: Insomnia No longer well controlled on Ambien.  We discussed treatment options.  We will start Lunesta.  He will send a message in a few weeks via MyChart to let us know how he is doing.   Paresthesia No red flags.  May have mild spinal stenosis.  He did have a back fracture several years ago.  MRI from 2012 does show some stenosis.  He does not wish to have further work-up at this point.  We discussed reasons to return to care.  Hypertension associated with diabetes ([name]) Above goal today.  His home readings are typically well controlled.  We will continue management per cardiology.  He will let us know if intolerance or persistently elevated.     Subjective:  HPI:  Please see A/P for status of chronic conditions.  Patient has been on Ambien for several years.  No longer feels like it is effective.  Going to try an alternative medication.  Tingling in feet when standing up for the last few weeks. Occurs after getting up in the morning. No weakness or balance issues.  Symptoms are not particular bothersome.       Objective:  Physical Exam: BP (!) 167/102 (BP Location: Left Arm)    Pulse 97    Temp 98.4 F (36.9 C) (Temporal)    Ht 5\' 9"  (1.753 m)    Wt 156 lb 3.2 oz (70.9 kg)    SpO2 99%    BMI 23.07 kg/m   Gen: No acute distress, resting comfortably CV: Regular rate and rhythm with no murmurs appreciated Pulm: Normal work of breathing, clear to auscultation bilaterally with no crackles, wheezes, or rhonchi Neuro: Grossly normal, moves all extremities Psych: Normal affect and thought content      Douglas Ballard M. [name], MD 10/16/2021 11:29 AM

## 2021-10-16 NOTE — Assessment & Plan Note (Signed)
Above goal today.  His home readings are typically well controlled.  We will continue management per cardiology.  He will let us know if intolerance or persistently elevated.

## 2021-10-16 NOTE — Patient Instructions (Signed)
It was very nice to see you today!  Please try the Lunesta.  Sending message in a few weeks to let me know how this is working.  You may have mild spinal stenosis.  Please let me know if this worsens.  Keep an eye on your blood pressure at home and let me know if it is persistently elevated.  Take care, Dr [name]  PLEASE NOTE:  If you had any lab tests please let us know if you have not heard back within a few days. You may see your results on mychart before we have a chance to review them but we will give you a call once they are reviewed by [place]. If we ordered any referrals today, please let us know if you have not heard from their office within the next week.   Please try these tips to maintain a healthy lifestyle:  Eat at least 3 REAL meals and 1-2 snacks per day.  Aim for no more than 5 hours between eating.  If you eat breakfast, please do so within one hour of getting up.   Each meal should contain half fruits/vegetables, one quarter protein, and one quarter carbs (no bigger than a computer mouse)  Cut down on sweet beverages. This includes juice, soda, and sweet tea.   Drink at least 1 glass of water with each meal and aim for at least 8 glasses per day  Exercise at least 150 minutes every week.

## 2021-10-16 NOTE — Assessment & Plan Note (Signed)
No red flags.  May have mild spinal stenosis.  He did have a back fracture several years ago.  MRI from 2012 does show some stenosis.  He does not wish to have further work-up at this point.  We discussed reasons to return to care.

## 2021-10-17 ENCOUNTER — Other Ambulatory Visit: Payer: Self-pay

## 2021-10-17 ENCOUNTER — Encounter: Payer: Self-pay | Admitting: Physician Assistant

## 2021-10-17 ENCOUNTER — Ambulatory Visit: Payer: Medicare HMO | Admitting: Physician Assistant

## 2021-10-17 DIAGNOSIS — Z86018 Personal history of other benign neoplasm: Secondary | ICD-10-CM | POA: Diagnosis not present

## 2021-10-17 DIAGNOSIS — Z1283 Encounter for screening for malignant neoplasm of skin: Secondary | ICD-10-CM | POA: Diagnosis not present

## 2021-10-17 DIAGNOSIS — Z808 Family history of malignant neoplasm of other organs or systems: Secondary | ICD-10-CM

## 2021-10-25 ENCOUNTER — Encounter: Payer: Self-pay | Admitting: Family Medicine

## 2021-10-25 NOTE — Telephone Encounter (Signed)
Please advise 

## 2021-10-26 MED ORDER — ZOLPIDEM TARTRATE 10 MG PO TABS: 10.0000 mg | ORAL_TABLET | Freq: Every evening | ORAL | 1 refills | Status: DC | PRN

## 2021-11-01 ENCOUNTER — Telehealth: Payer: Self-pay | Admitting: Family Medicine

## 2021-11-01 NOTE — Telephone Encounter (Signed)
Spoke w/ pt to get  more information and called [facility] to get Ambien sent out to patient. Advised Rx picked up on 10/16/21 was not working and being discontinued, pt will be starting the new 10 mg Ambien. Issue resolved and medication being sent out to patient. ?

## 2021-11-01 NOTE — Telephone Encounter (Signed)
Pt states [place] [place] has contacted them regarding his medication. They are telling him that they have reached out to [place] on several occasions to refill this prescription and we have refused to call them back. Please advise ? ?MEDICATION:zolpidem (AMBIEN) 10 MG tablet ? ?PHARMACY: ?[facility], [place] Phone:  [phone]  ?Fax:  [phone]  ?  ? ?

## 2021-11-06 ENCOUNTER — Encounter: Payer: Self-pay | Admitting: Cardiology

## 2021-11-06 ENCOUNTER — Encounter: Payer: Self-pay | Admitting: Physician Assistant

## 2021-11-06 MED ORDER — RIVAROXABAN 15 MG PO TABS: 15.0000 mg | ORAL_TABLET | Freq: Every day | ORAL | 1 refills | Status: DC

## 2021-12-04 DIAGNOSIS — N184 Chronic kidney disease, stage 4 (severe): Secondary | ICD-10-CM | POA: Diagnosis not present

## 2021-12-07 DIAGNOSIS — N184 Chronic kidney disease, stage 4 (severe): Secondary | ICD-10-CM | POA: Diagnosis not present

## 2021-12-07 DIAGNOSIS — E1122 Type 2 diabetes mellitus with diabetic chronic kidney disease: Secondary | ICD-10-CM | POA: Diagnosis not present

## 2021-12-07 DIAGNOSIS — I130 Hypertensive heart and chronic kidney disease with heart failure and stage 1 through stage 4 chronic kidney disease, or unspecified chronic kidney disease: Secondary | ICD-10-CM | POA: Diagnosis not present

## 2021-12-07 DIAGNOSIS — D631 Anemia in chronic kidney disease: Secondary | ICD-10-CM | POA: Diagnosis not present

## 2021-12-07 DIAGNOSIS — I129 Hypertensive chronic kidney disease with stage 1 through stage 4 chronic kidney disease, or unspecified chronic kidney disease: Secondary | ICD-10-CM | POA: Diagnosis not present

## 2021-12-07 DIAGNOSIS — I509 Heart failure, unspecified: Secondary | ICD-10-CM | POA: Diagnosis not present

## 2021-12-23 ENCOUNTER — Other Ambulatory Visit: Payer: Self-pay | Admitting: Family Medicine

## 2021-12-25 NOTE — Telephone Encounter (Signed)
Last visit: 10/16/21 ? ?Next visit: 10/08/22 ? ?Last filled: 10/26/21 ? ?Quantity: 30 w/ 1 refill  ?

## 2021-12-28 ENCOUNTER — Other Ambulatory Visit: Payer: Self-pay | Admitting: Family Medicine

## 2022-01-17 ENCOUNTER — Other Ambulatory Visit: Payer: Self-pay

## 2022-01-17 ENCOUNTER — Emergency Department (HOSPITAL_BASED_OUTPATIENT_CLINIC_OR_DEPARTMENT_OTHER)
Admission: EM | Admit: 2022-01-17 | Discharge: 2022-01-17 | Disposition: A | Discharge status: Home / Self Care | Payer: Medicare HMO | Attending: Emergency Medicine | Admitting: Emergency Medicine

## 2022-01-17 ENCOUNTER — Encounter (HOSPITAL_BASED_OUTPATIENT_CLINIC_OR_DEPARTMENT_OTHER): Payer: Self-pay

## 2022-01-17 DIAGNOSIS — S6991XA Unspecified injury of right wrist, hand and finger(s), initial encounter: Secondary | ICD-10-CM | POA: Diagnosis present

## 2022-01-17 DIAGNOSIS — Y92195 Garage of other specified residential institution as the place of occurrence of the external cause: Secondary | ICD-10-CM | POA: Diagnosis not present

## 2022-01-17 DIAGNOSIS — S61411A Laceration without foreign body of right hand, initial encounter: Secondary | ICD-10-CM | POA: Diagnosis not present

## 2022-01-17 DIAGNOSIS — X58XXXA Exposure to other specified factors, initial encounter: Secondary | ICD-10-CM | POA: Diagnosis not present

## 2022-01-17 DIAGNOSIS — Z7901 Long term (current) use of anticoagulants: Secondary | ICD-10-CM | POA: Insufficient documentation

## 2022-01-17 DIAGNOSIS — S60921A Unspecified superficial injury of right hand, initial encounter: Secondary | ICD-10-CM | POA: Diagnosis not present

## 2022-01-17 MED ORDER — LIDOCAINE-EPINEPHRINE (PF) 2 %-1:200000 IJ SOLN
10.0000 mL | Freq: Once | INTRAMUSCULAR | Status: AC
  Administered 2022-01-17: 10 mL
  Filled 2022-01-17: qty 20

## 2022-01-17 MED ORDER — TETANUS-DIPHTH-ACELL PERTUSSIS 5-2.5-18.5 LF-MCG/0.5 IM SUSY: 0.5000 mL | PREFILLED_SYRINGE | Freq: Once | INTRAMUSCULAR | Status: DC

## 2022-01-17 NOTE — ED Triage Notes (Signed)
Patient here POV from Home.  Endorses putting Hand in Wooden Box last PM and sustaining a Skin Tear to Same that has been consistently bleeding since.  Tetanus Unknown. States he takes a Blood Thinner.   NAD Noted during Triage. A&Ox4. GCS 15. Ambulatory.

## 2022-01-17 NOTE — ED Provider Notes (Signed)
[facility] EMERGENCY DEPT Provider Note   CSN: [id number] Arrival date & time: 01/17/22  1742     History  Chief Complaint  Patient presents with   Hand Injury    Douglas Ballard is a 71 y.o. male.   Hand Injury  Patient with medical history of atrial fibrillation on Xarelto presents today due to right hand injury.  He was working in his garage last night when he scraped the dorsal aspect of his right hand against a box.  He tried applying a pressure dressing but it still oozing blood and has been for the last 24 hours.  Denies any paresthesias, no foreign bodies.  He is unsure when his last tetanus is, chart review says due in 2028.  No nausea, vomiting, syncope, hand pain.  Home Medications Prior to Admission medications   Medication Sig Start Date End Date Taking? Authorizing Provider  cloNIDine (CATAPRES) 0.1 MG tablet Take 1 tablet (0.1 mg total) by mouth daily. 05/16/21   Vivi Barrack, MD  DM-Doxylamine-Acetaminophen 15-6.25-325 MG/15ML LIQD Take 30 mLs by mouth at bedtime as needed (cough/sleep).    [provider]  eszopiclone 3 MG TABS Take 1 tablet (3 mg total) by mouth at bedtime. Take immediately before bedtime 10/16/21   Vivi Barrack, MD  FEROSUL 325 (65 Fe) MG tablet TAKE 1 TABLET EVERY DAY (SUBSTITUTED FOR FEROSUL) 06/30/21   Vivi Barrack, MD  folic acid (FOLVITE) 1 MG tablet Take 1 tablet (1 mg total) by mouth daily. 05/16/21   Vivi Barrack, MD  furosemide (LASIX) 40 MG tablet Take 1 tablet (40 mg total) by mouth daily. 08/26/20   [place], Peter M, MD  hydrOXYzine (ATARAX) 50 MG tablet TAKE 1 TABLET THREE TIMES DAILY AS NEEDED FOR ANXIETY (AND INSOMNIA). 08/24/21   Vivi Barrack, MD  isosorbide-hydrALAZINE (BIDIL) 20-37.5 MG tablet Take 2 tablets by mouth 2 (two) times daily.     [provider]  labetalol (NORMODYNE) 200 MG tablet Take 1 tablet (200 mg total) by mouth 2 (two) times daily. 04/06/20   Almyra Deforest, PA  Multiple  Vitamin (MULTIVITAMIN WITH MINERALS) TABS tablet Take 1 tablet by mouth daily. 03/18/20   Hosie Poisson, MD  pantoprazole (PROTONIX) 40 MG tablet TAKE 1 TABLET TWICE DAILY 12/28/21   Vivi Barrack, MD  Rivaroxaban (XARELTO) 15 MG TABS tablet Take 1 tablet (15 mg total) by mouth daily with supper. 11/06/21   [place], Peter M, MD  sitaGLIPtin (JANUVIA) 100 MG tablet Take 1 tablet (100 mg total) by mouth daily. 10/10/21   Vivi Barrack, MD  thiamine 100 MG tablet Take 1 tablet (100 mg total) by mouth daily. 03/18/20   Hosie Poisson, MD  triamcinolone cream (KENALOG) 0.1 % Apply topically 2 (two) times daily. 11/12/19   [place], Ronalee Red, PA-C  zolpidem (AMBIEN) 10 MG tablet TAKE 1 TABLET (10 MG TOTAL) BY MOUTH AT BEDTIME AS NEEDED FOR SLEEP. 12/25/21 01/24/22  Vivi Barrack, MD      Allergies    Hydrochlorothiazide, Lasix [furosemide], Nsaids, and Sulfa antibiotics    Review of Systems   Review of Systems  Physical Exam Updated Vital Signs BP (!) 158/90   Pulse 97   Temp 98.4 F (36.9 C) (Oral)   Resp 18   Ht '5\' 9"'$  (1.753 m)   Wt 70.9 kg   SpO2 98%   BMI 23.08 kg/m  Physical Exam Vitals and nursing note reviewed. Exam conducted with a chaperone present.  Constitutional:      General: He is not in acute distress.    Appearance: Normal appearance.  HENT:     Head: Normocephalic and atraumatic.  Eyes:     General: No scleral icterus.    Extraocular Movements: Extraocular movements intact.     Pupils: Pupils are equal, round, and reactive to light.  Cardiovascular:     Pulses: Normal pulses.  Musculoskeletal:        General: No tenderness.  Skin:    Capillary Refill: Capillary refill takes less than 2 seconds.     Coloration: Skin is not jaundiced.     Comments: 2 cm avulsion of skin/skin tear slowly oozing blood.  No bony tenderness over the hand forcefully or truly, makes a fist, no deficit ROM  Neurological:     Mental Status: He is alert. Mental status is at baseline.      Coordination: Coordination normal.    ED Results / Procedures / Treatments   Labs (all labs ordered are listed, but only abnormal results are displayed) Labs Reviewed - No data to display  EKG None  Radiology No results found.  Procedures Procedures    Medications Ordered in ED Medications  lidocaine-EPINEPHrine (XYLOCAINE W/EPI) 2 %-1:200000 (PF) injection 10 mL (10 mLs Other Given by Other 01/17/22 1827)    ED Course/ Medical Decision Making/ A&P                           Medical Decision Making Risk Prescription drug management.   Patient presents due to superficial laceration to dorsal aspect of right hand bleeding.  He is neurovascularly intact, there is an oozing venous bleed dorsal aspect of right hand without any surrounding erythema or warmth..  Some greater than 12 hours and is too superficial to benefit from lip laceration repair.  Initially tried applying lidocaine with epinephrine over the dorsal aspect to promote vasoconstriction.    I reviewed external records, patient's last tetanus update is due in 2028.  Does not need booster at this time.  On reevaluation patient is still having oozing bleeding. Will apply pressure dressing.   Pressure dressing applied.  With return precaution discussed, patient to follow-up with his PCP in the next 48 hours for wound recheck and will remove pressure dressing at that time.  Discharged in stable condition.        Final Clinical Impression(s) / ED Diagnoses Final diagnoses:  Injury of right hand, initial encounter    Rx / DC Orders ED Discharge Orders     None         Sherrill Raring, Hershal Coria 01/17/22 2100    Davonna Belling, MD 01/18/22 [phone]

## 2022-01-17 NOTE — Discharge Instructions (Addendum)
Leave the pressure dressing on for the next 48 hours.  Follow-up with your primary on Friday to remove the dressing and make sure the bleeding is fully stopped.  If the bleeding is consistent or if you start having loss of consciousness, dizziness, headaches return back to ED for further evaluation.

## 2022-01-22 ENCOUNTER — Telehealth: Payer: Self-pay | Admitting: Family Medicine

## 2022-01-22 NOTE — Telephone Encounter (Signed)
Pharmacist states a fax has been sent and is requesting follow up by phone or fax response.  Pharmacist states recent documentation shows pt was diagnosed with Type Diabetes. Per ADA guideline a statin would be prescribed.  Pharmacist would like follow up about if this type of medication has been/will be prescribed; if not, what is the reasoning.  Fax was not requested to be re-sent, and FO rep cannot confirm a fax had been sent.  Please call if you have any questions.

## 2022-01-23 NOTE — Telephone Encounter (Signed)
We have discussed before and will discuss again at a future office visit.

## 2022-01-23 NOTE — Telephone Encounter (Signed)
Please advise 

## 2022-01-28 ENCOUNTER — Other Ambulatory Visit: Payer: Self-pay | Admitting: Family Medicine

## 2022-01-31 ENCOUNTER — Encounter: Payer: Self-pay | Admitting: Cardiology

## 2022-02-01 ENCOUNTER — Other Ambulatory Visit: Payer: Self-pay

## 2022-02-01 DIAGNOSIS — I480 Paroxysmal atrial fibrillation: Secondary | ICD-10-CM

## 2022-02-01 MED ORDER — RIVAROXABAN 15 MG PO TABS: 15.0000 mg | ORAL_TABLET | Freq: Every day | ORAL | 1 refills | Status: AC

## 2022-02-01 NOTE — Telephone Encounter (Signed)
Prescription refill request for Xarelto received.  Indication: Afib  Last office visit: 06/12/21 ([place])  Weight: 70.9kg Age: 71 Scr: 3.46 (12/04/21) CrCl: 19.38m/min  Appropriate dose and refill sent to requested pharmacy.

## 2022-02-28 ENCOUNTER — Other Ambulatory Visit: Payer: Self-pay | Admitting: Family Medicine

## 2022-04-20 ENCOUNTER — Encounter: Payer: Self-pay | Admitting: Family Medicine

## 2022-04-20 ENCOUNTER — Ambulatory Visit (INDEPENDENT_AMBULATORY_CARE_PROVIDER_SITE_OTHER): Payer: Medicare HMO | Admitting: Family Medicine

## 2022-04-20 VITALS — BP 168/93 | HR 96 | Temp 98.7°F | Ht 69.0 in | Wt 158.2 lb

## 2022-04-20 DIAGNOSIS — E1159 Type 2 diabetes mellitus with other circulatory complications: Secondary | ICD-10-CM | POA: Diagnosis not present

## 2022-04-20 DIAGNOSIS — N184 Chronic kidney disease, stage 4 (severe): Secondary | ICD-10-CM | POA: Diagnosis not present

## 2022-04-20 DIAGNOSIS — I480 Paroxysmal atrial fibrillation: Secondary | ICD-10-CM

## 2022-04-20 DIAGNOSIS — E1122 Type 2 diabetes mellitus with diabetic chronic kidney disease: Secondary | ICD-10-CM

## 2022-04-20 DIAGNOSIS — I152 Hypertension secondary to endocrine disorders: Secondary | ICD-10-CM | POA: Diagnosis not present

## 2022-04-20 NOTE — Progress Notes (Addendum)
We did  Douglas Ballard is a 71 y.o. male who presents today for an office visit.  Assessment/Plan:  Chronic Problems Addressed Today: Paroxysmal atrial fibrillation (HCC) Rate controlled.  Anticoagulated on Xarelto.  He is interested in alternatives this due to cost.  He will discuss with his cardiologist though may be interested in starting warfarin.  Hypertension associated with diabetes ([place]) Above goal though typically well controlled at home.  Continue management per cardiology.  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  He will check with insurance to see if there are any recommended alternatives and follow-up with me soon  Will place referral to ophthalmology today.   He will come back for high dose flu vaccine soon    Subjective:  HPI:  See A/p for status of chronic conditions.         Objective:  Physical Exam: BP (!) 168/93   Pulse 96   Temp 98.7 F (37.1 C)   Ht '5\' 9"'$  (1.753 m)   Wt 158 lb 3.2 oz (71.8 kg)   SpO2 97%   BMI 23.36 kg/m   Gen: No acute distress, resting comfortably CV: Regular rate and rhythm with no murmurs appreciated Pulm: Normal work of breathing, clear to auscultation bilaterally with no crackles, wheezes, or rhonchi Neuro: Grossly normal, moves all extremities Psych: Normal affect and thought content      Douglas Ballard M. [name], MD 04/20/2022 9:19 AM

## 2022-04-20 NOTE — Assessment & Plan Note (Signed)
Above goal though typically well controlled at home.  Continue management per cardiology.

## 2022-04-20 NOTE — Assessment & Plan Note (Addendum)
He is interested in switching to an alternative instead of Januvia due to cost.  He will check with insurance to see if there are any recommended alternatives and follow-up with me soon  Will place referral to ophthalmology today.

## 2022-04-20 NOTE — Assessment & Plan Note (Signed)
Rate controlled.  Anticoagulated on Xarelto.  He is interested in alternatives this due to cost.  He will discuss with his cardiologist though may be interested in starting warfarin.

## 2022-04-20 NOTE — Patient Instructions (Signed)
It was very nice to see you today!  Please check with your insurance about alternatives to the Januvia.  We can discuss with Dr Martinique about alternatives to the Xarelto.  I will see back in 3 to 6 months.  Come back sooner if needed.  Take care, Dr [name]  PLEASE NOTE:  If you had any lab tests please let us know if you have not heard back within a few days. You may see your results on mychart before we have a chance to review them but we will give you a call once they are reviewed by [place]. If we ordered any referrals today, please let us know if you have not heard from their office within the next week.   Please try these tips to maintain a healthy lifestyle:  Eat at least 3 REAL meals and 1-2 snacks per day.  Aim for no more than 5 hours between eating.  If you eat breakfast, please do so within one hour of getting up.   Each meal should contain half fruits/vegetables, one quarter protein, and one quarter carbs (no bigger than a computer mouse)  Cut down on sweet beverages. This includes juice, soda, and sweet tea.   Drink at least 1 glass of water with each meal and aim for at least 8 glasses per day  Exercise at least 150 minutes every week.

## 2022-04-26 ENCOUNTER — Ambulatory Visit: Payer: Medicare HMO | Admitting: Physician Assistant

## 2022-04-30 ENCOUNTER — Other Ambulatory Visit: Payer: Self-pay | Admitting: Family Medicine

## 2022-05-07 DIAGNOSIS — N184 Chronic kidney disease, stage 4 (severe): Secondary | ICD-10-CM | POA: Diagnosis not present

## 2022-05-10 DIAGNOSIS — N184 Chronic kidney disease, stage 4 (severe): Secondary | ICD-10-CM | POA: Diagnosis not present

## 2022-05-10 DIAGNOSIS — E1122 Type 2 diabetes mellitus with diabetic chronic kidney disease: Secondary | ICD-10-CM | POA: Diagnosis not present

## 2022-05-10 DIAGNOSIS — D631 Anemia in chronic kidney disease: Secondary | ICD-10-CM | POA: Diagnosis not present

## 2022-05-10 DIAGNOSIS — I129 Hypertensive chronic kidney disease with stage 1 through stage 4 chronic kidney disease, or unspecified chronic kidney disease: Secondary | ICD-10-CM | POA: Diagnosis not present

## 2022-05-17 ENCOUNTER — Other Ambulatory Visit: Payer: Self-pay | Admitting: Family Medicine

## 2022-06-18 ENCOUNTER — Telehealth: Payer: Self-pay | Admitting: Cardiology

## 2022-06-18 NOTE — Telephone Encounter (Signed)
Called to update address due to getting return mail.,to make next appt, was unable to lvm due to mail box being full.

## 2022-06-20 ENCOUNTER — Other Ambulatory Visit: Payer: Self-pay | Admitting: Family Medicine

## 2022-07-06 ENCOUNTER — Other Ambulatory Visit: Payer: Self-pay | Admitting: Family Medicine

## 2022-07-27 ENCOUNTER — Other Ambulatory Visit: Payer: Self-pay | Admitting: Family Medicine

## 2022-08-15 DIAGNOSIS — N184 Chronic kidney disease, stage 4 (severe): Secondary | ICD-10-CM | POA: Diagnosis not present

## 2022-08-29 ENCOUNTER — Telehealth: Payer: Self-pay | Admitting: *Deleted

## 2022-08-29 NOTE — Patient Outreach (Signed)
  Care Coordination   08/29/2022 Name: Douglas Ballard MRN: [id number] DOB: Oct 15, 1950   Care Coordination Outreach Attempts:  An unsuccessful telephone outreach was attempted today to offer the patient information about available care coordination services as a benefit of their health plan.   Follow Up Plan:  Additional outreach attempts will be made to offer the patient care coordination information and services.   Encounter Outcome:  No Answer   Care Coordination Interventions:  No, not indicated   Raina Mina, RN Care Management Coordinator [place] Office [phone]

## 2022-08-30 ENCOUNTER — Other Ambulatory Visit: Payer: Self-pay

## 2022-08-30 ENCOUNTER — Inpatient Hospital Stay (HOSPITAL_COMMUNITY): Payer: Medicare HMO

## 2022-08-30 ENCOUNTER — Emergency Department (HOSPITAL_COMMUNITY): Payer: Medicare HMO

## 2022-08-30 ENCOUNTER — Inpatient Hospital Stay (HOSPITAL_COMMUNITY)
Admission: EM | Admit: 2022-08-30 | Discharge: 2022-09-20 | DRG: 871 | Disposition: E | Payer: Medicare HMO | Attending: Internal Medicine | Admitting: Internal Medicine

## 2022-08-30 ENCOUNTER — Encounter (HOSPITAL_COMMUNITY): Payer: Self-pay | Admitting: *Deleted

## 2022-08-30 DIAGNOSIS — Z8042 Family history of malignant neoplasm of prostate: Secondary | ICD-10-CM

## 2022-08-30 DIAGNOSIS — Z1152 Encounter for screening for COVID-19: Secondary | ICD-10-CM | POA: Diagnosis not present

## 2022-08-30 DIAGNOSIS — R918 Other nonspecific abnormal finding of lung field: Secondary | ICD-10-CM | POA: Diagnosis not present

## 2022-08-30 DIAGNOSIS — D631 Anemia in chronic kidney disease: Secondary | ICD-10-CM | POA: Diagnosis present

## 2022-08-30 DIAGNOSIS — I4821 Permanent atrial fibrillation: Secondary | ICD-10-CM | POA: Diagnosis not present

## 2022-08-30 DIAGNOSIS — E43 Unspecified severe protein-calorie malnutrition: Secondary | ICD-10-CM | POA: Diagnosis not present

## 2022-08-30 DIAGNOSIS — M25571 Pain in right ankle and joints of right foot: Secondary | ICD-10-CM | POA: Diagnosis not present

## 2022-08-30 DIAGNOSIS — Z781 Physical restraint status: Secondary | ICD-10-CM

## 2022-08-30 DIAGNOSIS — R000 Tachycardia, unspecified: Secondary | ICD-10-CM | POA: Diagnosis not present

## 2022-08-30 DIAGNOSIS — G928 Other toxic encephalopathy: Secondary | ICD-10-CM | POA: Diagnosis present

## 2022-08-30 DIAGNOSIS — E871 Hypo-osmolality and hyponatremia: Secondary | ICD-10-CM | POA: Diagnosis present

## 2022-08-30 DIAGNOSIS — G9341 Metabolic encephalopathy: Secondary | ICD-10-CM

## 2022-08-30 DIAGNOSIS — W06XXXA Fall from bed, initial encounter: Secondary | ICD-10-CM | POA: Diagnosis present

## 2022-08-30 DIAGNOSIS — Z8249 Family history of ischemic heart disease and other diseases of the circulatory system: Secondary | ICD-10-CM

## 2022-08-30 DIAGNOSIS — Z886 Allergy status to analgesic agent status: Secondary | ICD-10-CM

## 2022-08-30 DIAGNOSIS — D62 Acute posthemorrhagic anemia: Secondary | ICD-10-CM | POA: Diagnosis present

## 2022-08-30 DIAGNOSIS — A419 Sepsis, unspecified organism: Secondary | ICD-10-CM | POA: Diagnosis not present

## 2022-08-30 DIAGNOSIS — E876 Hypokalemia: Secondary | ICD-10-CM | POA: Diagnosis not present

## 2022-08-30 DIAGNOSIS — Z4659 Encounter for fitting and adjustment of other gastrointestinal appliance and device: Secondary | ICD-10-CM | POA: Diagnosis not present

## 2022-08-30 DIAGNOSIS — J90 Pleural effusion, not elsewhere classified: Secondary | ICD-10-CM | POA: Diagnosis not present

## 2022-08-30 DIAGNOSIS — J9690 Respiratory failure, unspecified, unspecified whether with hypoxia or hypercapnia: Secondary | ICD-10-CM | POA: Diagnosis not present

## 2022-08-30 DIAGNOSIS — R7401 Elevation of levels of liver transaminase levels: Secondary | ICD-10-CM | POA: Diagnosis present

## 2022-08-30 DIAGNOSIS — K7200 Acute and subacute hepatic failure without coma: Secondary | ICD-10-CM | POA: Diagnosis not present

## 2022-08-30 DIAGNOSIS — K761 Chronic passive congestion of liver: Secondary | ICD-10-CM | POA: Diagnosis present

## 2022-08-30 DIAGNOSIS — E440 Moderate protein-calorie malnutrition: Secondary | ICD-10-CM | POA: Diagnosis not present

## 2022-08-30 DIAGNOSIS — W19XXXA Unspecified fall, initial encounter: Principal | ICD-10-CM

## 2022-08-30 DIAGNOSIS — R4589 Other symptoms and signs involving emotional state: Secondary | ICD-10-CM

## 2022-08-30 DIAGNOSIS — Z8673 Personal history of transient ischemic attack (TIA), and cerebral infarction without residual deficits: Secondary | ICD-10-CM

## 2022-08-30 DIAGNOSIS — I517 Cardiomegaly: Secondary | ICD-10-CM | POA: Diagnosis not present

## 2022-08-30 DIAGNOSIS — E1122 Type 2 diabetes mellitus with diabetic chronic kidney disease: Secondary | ICD-10-CM | POA: Diagnosis present

## 2022-08-30 DIAGNOSIS — I4891 Unspecified atrial fibrillation: Secondary | ICD-10-CM

## 2022-08-30 DIAGNOSIS — F1010 Alcohol abuse, uncomplicated: Secondary | ICD-10-CM | POA: Diagnosis not present

## 2022-08-30 DIAGNOSIS — Z23 Encounter for immunization: Secondary | ICD-10-CM | POA: Diagnosis not present

## 2022-08-30 DIAGNOSIS — E861 Hypovolemia: Secondary | ICD-10-CM | POA: Diagnosis present

## 2022-08-30 DIAGNOSIS — S0101XA Laceration without foreign body of scalp, initial encounter: Secondary | ICD-10-CM | POA: Diagnosis present

## 2022-08-30 DIAGNOSIS — S0990XA Unspecified injury of head, initial encounter: Secondary | ICD-10-CM

## 2022-08-30 DIAGNOSIS — M5137 Other intervertebral disc degeneration, lumbosacral region: Secondary | ICD-10-CM | POA: Diagnosis not present

## 2022-08-30 DIAGNOSIS — I5041 Acute combined systolic (congestive) and diastolic (congestive) heart failure: Secondary | ICD-10-CM | POA: Diagnosis not present

## 2022-08-30 DIAGNOSIS — I509 Heart failure, unspecified: Secondary | ICD-10-CM | POA: Diagnosis not present

## 2022-08-30 DIAGNOSIS — I5021 Acute systolic (congestive) heart failure: Secondary | ICD-10-CM | POA: Diagnosis present

## 2022-08-30 DIAGNOSIS — Z515 Encounter for palliative care: Secondary | ICD-10-CM

## 2022-08-30 DIAGNOSIS — J811 Chronic pulmonary edema: Secondary | ICD-10-CM | POA: Diagnosis not present

## 2022-08-30 DIAGNOSIS — Z83719 Family history of colon polyps, unspecified: Secondary | ICD-10-CM

## 2022-08-30 DIAGNOSIS — F10930 Alcohol use, unspecified with withdrawal, uncomplicated: Secondary | ICD-10-CM

## 2022-08-30 DIAGNOSIS — Z888 Allergy status to other drugs, medicaments and biological substances status: Secondary | ICD-10-CM

## 2022-08-30 DIAGNOSIS — Z87891 Personal history of nicotine dependence: Secondary | ICD-10-CM

## 2022-08-30 DIAGNOSIS — N179 Acute kidney failure, unspecified: Secondary | ICD-10-CM | POA: Diagnosis not present

## 2022-08-30 DIAGNOSIS — E872 Acidosis, unspecified: Secondary | ICD-10-CM | POA: Diagnosis present

## 2022-08-30 DIAGNOSIS — D6959 Other secondary thrombocytopenia: Secondary | ICD-10-CM | POA: Diagnosis present

## 2022-08-30 DIAGNOSIS — G4489 Other headache syndrome: Secondary | ICD-10-CM | POA: Diagnosis not present

## 2022-08-30 DIAGNOSIS — M47812 Spondylosis without myelopathy or radiculopathy, cervical region: Secondary | ICD-10-CM | POA: Diagnosis not present

## 2022-08-30 DIAGNOSIS — Z7189 Other specified counseling: Secondary | ICD-10-CM | POA: Diagnosis not present

## 2022-08-30 DIAGNOSIS — Z66 Do not resuscitate: Secondary | ICD-10-CM | POA: Diagnosis not present

## 2022-08-30 DIAGNOSIS — Z79899 Other long term (current) drug therapy: Secondary | ICD-10-CM

## 2022-08-30 DIAGNOSIS — E870 Hyperosmolality and hypernatremia: Secondary | ICD-10-CM | POA: Diagnosis not present

## 2022-08-30 DIAGNOSIS — Z801 Family history of malignant neoplasm of trachea, bronchus and lung: Secondary | ICD-10-CM

## 2022-08-30 DIAGNOSIS — I426 Alcoholic cardiomyopathy: Secondary | ICD-10-CM | POA: Diagnosis present

## 2022-08-30 DIAGNOSIS — F10230 Alcohol dependence with withdrawal, uncomplicated: Secondary | ICD-10-CM | POA: Diagnosis present

## 2022-08-30 DIAGNOSIS — Z043 Encounter for examination and observation following other accident: Secondary | ICD-10-CM | POA: Diagnosis not present

## 2022-08-30 DIAGNOSIS — Z882 Allergy status to sulfonamides status: Secondary | ICD-10-CM

## 2022-08-30 DIAGNOSIS — R6521 Severe sepsis with septic shock: Secondary | ICD-10-CM | POA: Diagnosis not present

## 2022-08-30 DIAGNOSIS — N184 Chronic kidney disease, stage 4 (severe): Secondary | ICD-10-CM | POA: Diagnosis not present

## 2022-08-30 DIAGNOSIS — I700 Atherosclerosis of aorta: Secondary | ICD-10-CM | POA: Diagnosis not present

## 2022-08-30 DIAGNOSIS — Z6824 Body mass index (BMI) 24.0-24.9, adult: Secondary | ICD-10-CM

## 2022-08-30 DIAGNOSIS — J690 Pneumonitis due to inhalation of food and vomit: Secondary | ICD-10-CM | POA: Diagnosis present

## 2022-08-30 DIAGNOSIS — J329 Chronic sinusitis, unspecified: Secondary | ICD-10-CM | POA: Diagnosis not present

## 2022-08-30 DIAGNOSIS — R4182 Altered mental status, unspecified: Secondary | ICD-10-CM | POA: Diagnosis not present

## 2022-08-30 DIAGNOSIS — D65 Disseminated intravascular coagulation [defibrination syndrome]: Secondary | ICD-10-CM | POA: Diagnosis not present

## 2022-08-30 DIAGNOSIS — I130 Hypertensive heart and chronic kidney disease with heart failure and stage 1 through stage 4 chronic kidney disease, or unspecified chronic kidney disease: Secondary | ICD-10-CM | POA: Diagnosis not present

## 2022-08-30 DIAGNOSIS — R109 Unspecified abdominal pain: Secondary | ICD-10-CM | POA: Diagnosis not present

## 2022-08-30 DIAGNOSIS — I10 Essential (primary) hypertension: Secondary | ICD-10-CM | POA: Diagnosis not present

## 2022-08-30 DIAGNOSIS — G9340 Encephalopathy, unspecified: Secondary | ICD-10-CM | POA: Diagnosis not present

## 2022-08-30 DIAGNOSIS — R112 Nausea with vomiting, unspecified: Secondary | ICD-10-CM | POA: Diagnosis not present

## 2022-08-30 DIAGNOSIS — E875 Hyperkalemia: Secondary | ICD-10-CM | POA: Diagnosis not present

## 2022-08-30 DIAGNOSIS — Z7901 Long term (current) use of anticoagulants: Secondary | ICD-10-CM

## 2022-08-30 DIAGNOSIS — D649 Anemia, unspecified: Secondary | ICD-10-CM | POA: Diagnosis not present

## 2022-08-30 DIAGNOSIS — R531 Weakness: Secondary | ICD-10-CM | POA: Diagnosis not present

## 2022-08-30 DIAGNOSIS — I452 Bifascicular block: Secondary | ICD-10-CM | POA: Diagnosis present

## 2022-08-30 DIAGNOSIS — Z7984 Long term (current) use of oral hypoglycemic drugs: Secondary | ICD-10-CM

## 2022-08-30 DIAGNOSIS — J9601 Acute respiratory failure with hypoxia: Secondary | ICD-10-CM | POA: Diagnosis not present

## 2022-08-30 DIAGNOSIS — Z452 Encounter for adjustment and management of vascular access device: Secondary | ICD-10-CM | POA: Diagnosis not present

## 2022-08-30 DIAGNOSIS — R54 Age-related physical debility: Secondary | ICD-10-CM | POA: Diagnosis present

## 2022-08-30 DIAGNOSIS — Z806 Family history of leukemia: Secondary | ICD-10-CM

## 2022-08-30 DIAGNOSIS — E1165 Type 2 diabetes mellitus with hyperglycemia: Secondary | ICD-10-CM | POA: Diagnosis present

## 2022-08-30 DIAGNOSIS — Z4682 Encounter for fitting and adjustment of non-vascular catheter: Secondary | ICD-10-CM | POA: Diagnosis not present

## 2022-08-30 LAB — CBC WITH DIFFERENTIAL/PLATELET
Abs Immature Granulocytes: 0.33 10*3/uL — ABNORMAL HIGH (ref 0.00–0.07)
Basophils Absolute: 0 10*3/uL (ref 0.0–0.1)
Basophils Relative: 0 %
Eosinophils Absolute: 0 10*3/uL (ref 0.0–0.5)
Eosinophils Relative: 0 %
HCT: 28.3 % — ABNORMAL LOW (ref 39.0–52.0)
Hemoglobin: 10.3 g/dL — ABNORMAL LOW (ref 13.0–17.0)
Immature Granulocytes: 2 %
Lymphocytes Relative: 2 %
Lymphs Abs: 0.5 10*3/uL — ABNORMAL LOW (ref 0.7–4.0)
MCH: 31.8 pg (ref 26.0–34.0)
MCHC: 36.4 g/dL — ABNORMAL HIGH (ref 30.0–36.0)
MCV: 87.3 fL (ref 80.0–100.0)
Monocytes Absolute: 2 10*3/uL — ABNORMAL HIGH (ref 0.1–1.0)
Monocytes Relative: 10 %
Neutro Abs: 17 10*3/uL — ABNORMAL HIGH (ref 1.7–7.7)
Neutrophils Relative %: 86 %
Platelets: 185 10*3/uL (ref 150–400)
RBC: 3.24 MIL/uL — ABNORMAL LOW (ref 4.22–5.81)
RDW: 11.2 % — ABNORMAL LOW (ref 11.5–15.5)
WBC: 19.8 10*3/uL — ABNORMAL HIGH (ref 4.0–10.5)
nRBC: 0 % (ref 0.0–0.2)

## 2022-08-30 LAB — ETHANOL: Alcohol, Ethyl (B): <10 mg/dL (ref <10)

## 2022-08-30 LAB — GLUCOSE, CAPILLARY
Glucose-Capillary: 107 mg/dL — ABNORMAL HIGH (ref 70–99)
Glucose-Capillary: 130 mg/dL — ABNORMAL HIGH (ref 70–99)
Glucose-Capillary: 75 mg/dL (ref 70–99)

## 2022-08-30 LAB — ECHOCARDIOGRAM COMPLETE
AR max vel: 2.47 cm2
AV Area VTI: 2.14 cm2
AV Area mean vel: 2.22 cm2
AV Mean grad: 2.6 mmHg
AV Peak grad: 4.8 mmHg
Ao pk vel: 1.1 m/s
Area-P 1/2: 5.26 cm2
Calc EF: 32.2 %
Height: 69 in
MV M vel: 4.38 m/s
MV Peak grad: 76.6 mmHg
P 1/2 time: 394 msec
S' Lateral: 4.5 cm
Single Plane A2C EF: 36.4 %
Single Plane A4C EF: 27.7 %
Weight: 2560 oz

## 2022-08-30 LAB — URINALYSIS, ROUTINE W REFLEX MICROSCOPIC
Bacteria, UA: NONE SEEN
Bilirubin Urine: NEGATIVE
Glucose, UA: NEGATIVE mg/dL
Ketones, ur: NEGATIVE mg/dL
Leukocytes,Ua: NEGATIVE
Nitrite: NEGATIVE
Protein, ur: 100 mg/dL — AB
Specific Gravity, Urine: 1.011 (ref 1.005–1.030)
pH: 5 (ref 5.0–8.0)

## 2022-08-30 LAB — COMPREHENSIVE METABOLIC PANEL
ALT: 553 U/L — ABNORMAL HIGH (ref 0–44)
AST: 712 U/L — ABNORMAL HIGH (ref 15–41)
Albumin: 3.1 g/dL — ABNORMAL LOW (ref 3.5–5.0)
Alkaline Phosphatase: 75 U/L (ref 38–126)
Anion gap: 19 — ABNORMAL HIGH (ref 5–15)
BUN: 64 mg/dL — ABNORMAL HIGH (ref 8–23)
CO2: 15 mmol/L — ABNORMAL LOW (ref 22–32)
Calcium: 4.9 mg/dL — CL (ref 8.9–10.3)
Chloride: 79 mmol/L — ABNORMAL LOW (ref 98–111)
Creatinine, Ser: 5.11 mg/dL — ABNORMAL HIGH (ref 0.61–1.24)
GFR, Estimated: 11 mL/min — ABNORMAL LOW (ref >60)
Glucose, Bld: 179 mg/dL — ABNORMAL HIGH (ref 70–99)
Potassium: 4.5 mmol/L (ref 3.5–5.1)
Sodium: 113 mmol/L — CL (ref 135–145)
Total Bilirubin: 2.1 mg/dL — ABNORMAL HIGH (ref 0.3–1.2)
Total Protein: 6.9 g/dL (ref 6.5–8.1)

## 2022-08-30 LAB — SODIUM
Sodium: 114 mmol/L — CL (ref 135–145)
Sodium: 119 mmol/L — CL (ref 135–145)
Sodium: 115 mmol/L — CL (ref 135–145)
Sodium: 118 mmol/L — CL (ref 135–145)

## 2022-08-30 LAB — RESP PANEL BY RT-PCR (RSV, FLU A&B, COVID)  RVPGX2
Influenza A by PCR: NEGATIVE
Influenza B by PCR: NEGATIVE
Resp Syncytial Virus by PCR: NEGATIVE
SARS Coronavirus 2 by RT PCR: NEGATIVE

## 2022-08-30 LAB — RAPID URINE DRUG SCREEN, HOSP PERFORMED
Amphetamines: NOT DETECTED
Barbiturates: NOT DETECTED
Benzodiazepines: NOT DETECTED
Cocaine: NOT DETECTED
Opiates: POSITIVE — AB
Tetrahydrocannabinol: NOT DETECTED

## 2022-08-30 LAB — PROCALCITONIN: Procalcitonin: 0.39 ng/mL

## 2022-08-30 LAB — PROTIME-INR
INR: 4.2 (ref 0.8–1.2)
Prothrombin Time: 40.4 seconds — ABNORMAL HIGH (ref 11.4–15.2)

## 2022-08-30 LAB — APTT: aPTT: 47 seconds — ABNORMAL HIGH (ref 24–36)

## 2022-08-30 LAB — MRSA NEXT GEN BY PCR, NASAL: MRSA by PCR Next Gen: NOT DETECTED

## 2022-08-30 LAB — CREATININE, URINE, RANDOM: Creatinine, Urine: 82 mg/dL

## 2022-08-30 LAB — LIPASE, BLOOD: Lipase: 49 U/L (ref 11–51)

## 2022-08-30 LAB — TSH: TSH: 1.317 u[IU]/mL (ref 0.350–4.500)

## 2022-08-30 LAB — TYPE AND SCREEN
ABO/RH(D): O POS
Antibody Screen: NEGATIVE

## 2022-08-30 LAB — LACTIC ACID, PLASMA
Lactic Acid, Venous: 2.2 mmol/L (ref 0.5–1.9)
Lactic Acid, Venous: 1.7 mmol/L (ref 0.5–1.9)

## 2022-08-30 LAB — TROPONIN I (HIGH SENSITIVITY)
Troponin I (High Sensitivity): 36 ng/L — ABNORMAL HIGH (ref <18)
Troponin I (High Sensitivity): 37 ng/L — ABNORMAL HIGH (ref <18)

## 2022-08-30 LAB — MAGNESIUM
Magnesium: 0.7 mg/dL — CL (ref 1.7–2.4)
Magnesium: 1.4 mg/dL — ABNORMAL LOW (ref 1.7–2.4)

## 2022-08-30 LAB — AMMONIA: Ammonia: 26 umol/L (ref 9–35)

## 2022-08-30 LAB — BRAIN NATRIURETIC PEPTIDE: B Natriuretic Peptide: 2970.3 pg/mL — ABNORMAL HIGH (ref 0.0–100.0)

## 2022-08-30 LAB — ACETAMINOPHEN LEVEL: Acetaminophen (Tylenol), Serum: <10 ug/mL — ABNORMAL LOW (ref 10–30)

## 2022-08-30 LAB — NA AND K (SODIUM & POTASSIUM), RAND UR
Potassium Urine: 33 mmol/L
Sodium, Ur: <10 mmol/L

## 2022-08-30 LAB — PHOSPHORUS: Phosphorus: 5.3 mg/dL — ABNORMAL HIGH (ref 2.5–4.6)

## 2022-08-30 LAB — CBG MONITORING, ED: Glucose-Capillary: 184 mg/dL — ABNORMAL HIGH (ref 70–99)

## 2022-08-30 LAB — HEPARIN LEVEL (UNFRACTIONATED): Heparin Unfractionated: >1.1 IU/mL — ABNORMAL HIGH (ref 0.30–0.70)

## 2022-08-30 MED ORDER — PERFLUTREN LIPID MICROSPHERE
1.0000 mL | INTRAVENOUS | Status: AC | PRN
  Administered 2022-08-30: 3 mL via INTRAVENOUS

## 2022-08-30 MED ORDER — MORPHINE SULFATE (PF) 4 MG/ML IV SOLN
4.0000 mg | Freq: Once | INTRAVENOUS | Status: AC
  Administered 2022-08-30: 4 mg via INTRAVENOUS
  Filled 2022-08-30: qty 1

## 2022-08-30 MED ORDER — SODIUM CHLORIDE 0.9 % IV SOLN
2.0000 g | Freq: Every day | INTRAVENOUS | Status: DC
  Administered 2022-08-30 – 2022-08-31 (×2): 2 g via INTRAVENOUS
  Filled 2022-08-30 (×2): qty 20

## 2022-08-30 MED ORDER — THIAMINE HCL 100 MG/ML IJ SOLN
250.0000 mg | INTRAVENOUS | Status: AC
  Administered 2022-09-01 – 2022-09-05 (×5): 250 mg via INTRAVENOUS
  Filled 2022-08-30 (×5): qty 2.5

## 2022-08-30 MED ORDER — THIAMINE HCL 100 MG/ML IJ SOLN
500.0000 mg | Freq: Three times a day (TID) | INTRAVENOUS | Status: AC
  Administered 2022-08-30 – 2022-08-31 (×6): 500 mg via INTRAVENOUS
  Filled 2022-08-30 (×7): qty 5

## 2022-08-30 MED ORDER — BUMETANIDE 0.25 MG/ML IJ SOLN
2.0000 mg | Freq: Two times a day (BID) | INTRAMUSCULAR | Status: DC
  Filled 2022-08-30: qty 10

## 2022-08-30 MED ORDER — MAGNESIUM SULFATE 4 GM/100ML IV SOLN
4.0000 g | Freq: Once | INTRAVENOUS | Status: AC
  Administered 2022-08-30: 4 g via INTRAVENOUS
  Filled 2022-08-30: qty 100

## 2022-08-30 MED ORDER — POLYETHYLENE GLYCOL 3350 17 G PO PACK: 17.0000 g | PACK | Freq: Every day | ORAL | Status: DC | PRN

## 2022-08-30 MED ORDER — ONDANSETRON HCL 4 MG/2ML IJ SOLN
4.0000 mg | Freq: Once | INTRAMUSCULAR | Status: AC
  Administered 2022-08-30: 4 mg via INTRAVENOUS
  Filled 2022-08-30: qty 2

## 2022-08-30 MED ORDER — BUMETANIDE 0.25 MG/ML IJ SOLN
2.0000 mg | Freq: Two times a day (BID) | INTRAMUSCULAR | Status: DC
  Administered 2022-08-30: 2 mg via INTRAVENOUS
  Filled 2022-08-30: qty 10

## 2022-08-30 MED ORDER — DILTIAZEM HCL-DEXTROSE 125-5 MG/125ML-% IV SOLN (PREMIX)
5.0000 mg/h | INTRAVENOUS | Status: DC
  Administered 2022-08-30: 5 mg/h via INTRAVENOUS
  Filled 2022-08-30: qty 125

## 2022-08-30 MED ORDER — INSULIN ASPART 100 UNIT/ML IJ SOLN
0.0000 [IU] | INTRAMUSCULAR | Status: DC
  Administered 2022-08-30: 1 [IU] via SUBCUTANEOUS
  Administered 2022-08-30 – 2022-09-01 (×3): 2 [IU] via SUBCUTANEOUS
  Administered 2022-09-01 (×3): 1 [IU] via SUBCUTANEOUS
  Administered 2022-09-01: 2 [IU] via SUBCUTANEOUS
  Administered 2022-09-01 – 2022-09-03 (×8): 1 [IU] via SUBCUTANEOUS
  Administered 2022-09-03 (×2): 2 [IU] via SUBCUTANEOUS
  Administered 2022-09-04 (×4): 1 [IU] via SUBCUTANEOUS
  Administered 2022-09-04: 2 [IU] via SUBCUTANEOUS
  Administered 2022-09-04: 1 [IU] via SUBCUTANEOUS
  Administered 2022-09-04: 2 [IU] via SUBCUTANEOUS
  Administered 2022-09-05 (×6): 1 [IU] via SUBCUTANEOUS
  Administered 2022-09-06 (×2): 2 [IU] via SUBCUTANEOUS
  Administered 2022-09-06: 3 [IU] via SUBCUTANEOUS
  Administered 2022-09-06: 2 [IU] via SUBCUTANEOUS
  Administered 2022-09-06 – 2022-09-07 (×2): 3 [IU] via SUBCUTANEOUS
  Administered 2022-09-07: 5 [IU] via SUBCUTANEOUS
  Filled 2022-08-30: qty 0.09

## 2022-08-30 MED ORDER — SODIUM CHLORIDE 0.9 % IV BOLUS
500.0000 mL | Freq: Once | INTRAVENOUS | Status: AC
  Administered 2022-08-30: 500 mL via INTRAVENOUS

## 2022-08-30 MED ORDER — SODIUM BICARBONATE 8.4 % IV SOLN
100.0000 meq | Freq: Once | INTRAVENOUS | Status: AC
  Administered 2022-08-30: 100 meq via INTRAVENOUS
  Filled 2022-08-30: qty 50

## 2022-08-30 MED ORDER — DEXMEDETOMIDINE HCL IN NACL 200 MCG/50ML IV SOLN
0.0000 ug/kg/h | INTRAVENOUS | Status: DC
  Administered 2022-08-30: 0.2 ug/kg/h via INTRAVENOUS
  Administered 2022-08-30: 0.5 ug/kg/h via INTRAVENOUS
  Administered 2022-08-31: 0.4 ug/kg/h via INTRAVENOUS
  Administered 2022-08-31: 0.5 ug/kg/h via INTRAVENOUS
  Filled 2022-08-30 (×3): qty 50

## 2022-08-30 MED ORDER — LORAZEPAM 2 MG/ML IJ SOLN
1.0000 mg | Freq: Once | INTRAMUSCULAR | Status: AC
  Administered 2022-08-30: 1 mg via INTRAVENOUS
  Filled 2022-08-30: qty 1

## 2022-08-30 MED ORDER — TETANUS-DIPHTH-ACELL PERTUSSIS 5-2.5-18.5 LF-MCG/0.5 IM SUSY
0.5000 mL | PREFILLED_SYRINGE | Freq: Once | INTRAMUSCULAR | Status: AC
  Administered 2022-08-30: 0.5 mL via INTRAMUSCULAR
  Filled 2022-08-30: qty 0.5

## 2022-08-30 MED ORDER — MAGNESIUM SULFATE 2 GM/50ML IV SOLN
2.0000 g | Freq: Once | INTRAVENOUS | Status: AC
  Administered 2022-08-30: 2 g via INTRAVENOUS
  Filled 2022-08-30: qty 50

## 2022-08-30 MED ORDER — SODIUM CHLORIDE 3 % IV SOLN
INTRAVENOUS | Status: DC
  Filled 2022-08-30 (×2): qty 500

## 2022-08-30 MED ORDER — HEPARIN (PORCINE) 25000 UT/250ML-% IV SOLN: 1050.0000 [IU]/h | INTRAVENOUS | Status: DC

## 2022-08-30 MED ORDER — FUROSEMIDE 10 MG/ML IJ SOLN
120.0000 mg | Freq: Once | INTRAVENOUS | Status: AC
  Administered 2022-08-30: 120 mg via INTRAVENOUS
  Filled 2022-08-30: qty 2

## 2022-08-30 MED ORDER — DOCUSATE SODIUM 100 MG PO CAPS: 100.0000 mg | ORAL_CAPSULE | Freq: Two times a day (BID) | ORAL | Status: DC | PRN

## 2022-08-30 NOTE — ED Notes (Addendum)
Patient report from EMS reported no neck pain or increased back pain.  Patient reports he does have neck pain and increased back pain.  He is also complaining of left ankle pain and left knee pain.  C collar placed on patient.  Md called to bedside for evaluation.  Patient also advises that he was trying to use the bathroom when he fell, he hit his head on the toilet

## 2022-08-30 NOTE — Progress Notes (Signed)
Notified MD Schertz in regards to the sodium that is currently 119. MD Schertz ordered to hold off starting 3% hypertonic saline and repeat sodium at 2000.

## 2022-08-30 NOTE — ED Provider Notes (Signed)
[facility] DEPT Provider Note   CSN: [id number] Arrival date & time: 08/23/2022  6761     History  Chief Complaint  Patient presents with   Fall   Nausea   Emesis    Merion Grimaldo is a 72 y.o. male.  72 year old male with prior medical history as detailed below presents for evaluation.  Patient arrives from home with EMS transport.  Patient reports that he got up this morning and went to the bathroom.  He lost his balance and fell.  He did strike his head.  Patient is on Xarelto.  Patient denies loss of consciousness.  Patient complains of neck pain, low back pain, bilateral ankle pain.  Patient with known history of A-fib.  Patient is on Xarelto.  Patient reports compliance with Xarelto.  Patient is unsure of his last tetanus.  Small laceration to the left temple reported by patient.  Patient reports that his last alcohol intake was approximately 2 to 3 days ago.  The history is provided by the patient and medical records.       Home Medications Prior to Admission medications   Medication Sig Start Date End Date Taking? Authorizing Provider  cloNIDine (CATAPRES) 0.1 MG tablet TAKE 1 TABLET EVERY DAY 02/28/22   Vivi Barrack, MD  DM-Doxylamine-Acetaminophen 15-6.25-325 MG/15ML LIQD Take 30 mLs by mouth at bedtime as needed (cough/sleep).    [provider]  eszopiclone 3 MG TABS Take 1 tablet (3 mg total) by mouth at bedtime. Take immediately before bedtime 10/16/21   Vivi Barrack, MD  FEROSUL 325 (65 Fe) MG tablet TAKE 1 TABLET EVERY DAY (SUBSTITUTED FOR FEROSUL) 04/30/22   Vivi Barrack, MD  folic acid (FOLVITE) 1 MG tablet TAKE 1 TABLET EVERY DAY 02/28/22   Vivi Barrack, MD  furosemide (LASIX) 40 MG tablet Take 1 tablet (40 mg total) by mouth daily. 08/26/20   Martinique, Carroll Ranney M, MD  hydrOXYzine (ATARAX) 50 MG tablet TAKE 1 TABLET THREE TIMES DAILY AS NEEDED FOR ANXIETY AND INSOMNIA 07/06/22   Vivi Barrack, MD   isosorbide-hydrALAZINE (BIDIL) 20-37.5 MG tablet Take 2 tablets by mouth 2 (two) times daily.     [provider]  JANUVIA 100 MG tablet TAKE 1 TABLET EVERY DAY 07/27/22   Vivi Barrack, MD  labetalol (NORMODYNE) 200 MG tablet Take 1 tablet (200 mg total) by mouth 2 (two) times daily. 04/06/20   Almyra Deforest, PA  Multiple Vitamin (MULTIVITAMIN WITH MINERALS) TABS tablet Take 1 tablet by mouth daily. 03/18/20   Hosie Poisson, MD  pantoprazole (PROTONIX) 40 MG tablet TAKE 1 TABLET TWICE DAILY 05/17/22   Vivi Barrack, MD  Rivaroxaban (XARELTO) 15 MG TABS tablet Take 1 tablet (15 mg total) by mouth daily with supper. 02/01/22   Martinique, Stephanye Finnicum M, MD  thiamine 100 MG tablet Take 1 tablet (100 mg total) by mouth daily. 03/18/20   Hosie Poisson, MD  triamcinolone cream (KENALOG) 0.1 % Apply topically 2 (two) times daily. 11/12/19   Sheffield, Ronalee Red, PA-C  zolpidem (AMBIEN) 10 MG tablet TAKE 1 TABLET (10 MG TOTAL) BY MOUTH AT BEDTIME AS NEEDED FOR SLEEP. 06/21/22 07/21/22  Vivi Barrack, MD      Allergies    Hydrochlorothiazide, Lasix [furosemide], Nsaids, and Sulfa antibiotics    Review of Systems   Review of Systems  All other systems reviewed and are negative.   Physical Exam Updated Vital Signs BP (!) 164/109 (BP Location: Right Arm)  Pulse (!) 125   Resp (!) 32   Ht '5\' 9"'$  (1.753 m)   Wt 72.6 kg   SpO2 100%   BMI 23.63 kg/m  Physical Exam Vitals and nursing note reviewed.  Constitutional:      General: He is not in acute distress.    Appearance: Normal appearance. He is well-developed.     Comments: Alert, uncomfortable, restless  HENT:     Head: Normocephalic.     Comments: Small 1 cm laceration to the left temple Eyes:     Extraocular Movements: Extraocular movements intact.     Conjunctiva/sclera: Conjunctivae normal.     Pupils: Pupils are equal, round, and reactive to light.  Neck:     Comments: Cervical collar in place Cardiovascular:     Rate and Rhythm:  Tachycardia present. Rhythm irregular.     Heart sounds: Normal heart sounds.  Pulmonary:     Effort: Pulmonary effort is normal. No respiratory distress.     Breath sounds: Normal breath sounds.  Abdominal:     General: There is no distension.     Palpations: Abdomen is soft.     Tenderness: There is no abdominal tenderness.  Musculoskeletal:        General: No deformity. Normal range of motion.     Cervical back: Normal range of motion.  Skin:    General: Skin is warm and dry.  Neurological:     General: No focal deficit present.     Mental Status: He is alert and oriented to person, place, and time.     Comments: Alert and oriented x 3, no focal deficit, GCS 15     ED Results / Procedures / Treatments   Labs (all labs ordered are listed, but only abnormal results are displayed) Labs Reviewed  URINALYSIS, ROUTINE W REFLEX MICROSCOPIC  CBC WITH DIFFERENTIAL/PLATELET  COMPREHENSIVE METABOLIC PANEL  ETHANOL  BRAIN NATRIURETIC PEPTIDE  TYPE AND SCREEN  TROPONIN I (HIGH SENSITIVITY)    EKG EKG Interpretation  Date/Time:  Thursday August 30 2022 08:26:25 EST Ventricular Rate:  125 PR Interval:    QRS Duration: 114 QT Interval:  379 QTC Calculation: 579 R Axis:   -70 Text Interpretation: Atrial fibrillation Incomplete right bundle branch block Inferior infarct, old Lateral leads are also involved Prolonged QT interval Confirmed by Dene Gentry [phone]) on 08/26/2022 8:47:13 AM  Radiology No results found.  Procedures .Marland KitchenLaceration Repair  Date/Time: 08/28/2022 9:01 AM  Performed by: Valarie Merino, MD Authorized by: Valarie Merino, MD   Consent:    Consent obtained:  Verbal   Consent given by:  Patient   Risks, benefits, and alternatives were discussed: yes     Risks discussed:  Infection, need for additional repair, nerve damage, poor wound healing, pain, retained foreign body, tendon damage, vascular damage and poor cosmetic result   Alternatives  discussed:  No treatment Universal protocol:    Immediately prior to procedure, a time out was called: yes     Patient identity confirmed:  Verbally with patient Anesthesia:    Anesthesia method:  None Laceration details:    Location:  Scalp   Scalp location:  L temporal   Length (cm):  1 Pre-procedure details:    Preparation:  Patient was prepped and draped in usual sterile fashion and imaging obtained to evaluate for foreign bodies Exploration:    Limited defect created (wound extended): no     Hemostasis achieved with:  Direct pressure   Imaging outcome: foreign  body not noted     Wound exploration: wound explored through full range of motion     Contaminated: no   Treatment:    Area cleansed with:  Povidone-iodine   Amount of cleaning:  Standard   Irrigation solution:  Tap water   Visualized foreign bodies/material removed: no     Debridement:  None   Undermining:  None   Scar revision: no   Skin repair:    Repair method:  Staples   Number of staples:  1 Approximation:    Approximation:  Close Repair type:    Repair type:  Simple Post-procedure details:    Dressing:  Open (no dressing)   Procedure completion:  Tolerated     Medications Ordered in ED Medications  morphine (PF) 4 MG/ML injection 4 mg (4 mg Intravenous Given 08/27/2022 0852)  Tdap (BOOSTRIX) injection 0.5 mL (0.5 mLs Intramuscular Given 08/25/2022 0854)  ondansetron (ZOFRAN) injection 4 mg (4 mg Intravenous Given 09/03/2022 4967)    ED Course/ Medical Decision Making/ A&P                           Medical Decision Making Amount and/or Complexity of Data Reviewed Labs: ordered. Radiology: ordered.  Risk Prescription drug management. Decision regarding hospitalization.    Medical Screen Complete  This patient presented to the ED with complaint of fall, head injury.  This complaint involves an extensive number of treatment options. The initial differential diagnosis includes, but is not limited  to, trauma related to fall, metabolic abnormality, etc.  This presentation is: Acute, Chronic, Self-Limited, Previously Undiagnosed, Uncertain Prognosis, Complicated, Systemic Symptoms, and Threat to Life/Bodily Function  Patient is presenting via EMS after reported fall at home.  Patient did strike his head.  Patient is reportedly on Xarelto.  Patient is agitated on initial evaluation.  He is a poor historian.  Patient is noted to be tachycardic with A-fib with RVR.  Superficial laceration to the left temple repaired with single staple.  Imaging studies obtained are without evidence of significant traumatic injury.    Patient is noted to be tremulous, mildly confused, tachycardic, hypertensive.  Patient with known history of alcohol abuse.  Presentation is consistent with likely alcohol withdrawal.  Labs are concerning given hyponatremia, AKI, hypocalcemia, elevated white count.  Patient will require admission.    Critical care made aware of case and will evaluate for admission.  Additional history obtained: External records from outside sources obtained and reviewed including prior ED visits and prior Inpatient records.    Lab Tests:  I ordered and personally interpreted labs.  The pertinent results include: CBC, CMP, troponin, BNP, magnesium, phosphorus   Imaging Studies ordered:  I ordered imaging studies including CT head, CT C-spine, plain films of bilateral ankles, lumbar spine, pelvis, chest I independently visualized and interpreted obtained imaging which showed no acute traumatic injury I agree with the radiologist interpretation.   Cardiac Monitoring:  The patient was maintained on a cardiac monitor.  I personally viewed and interpreted the cardiac monitor which showed an underlying rhythm of: A-fib with RVR   Medicines ordered:  I ordered medication including diazepam, tetanus prophylaxis, morphine, IV fluids, Cardizem drip for suspected alcohol withdrawal,  A-fib with RVR, pain Reevaluation of the patient after these medicines showed that the patient: improved   Problem List / ED Course:  Fall, head injury, A-fib with RVR, alcohol withdrawal, hyponatremia, hypocalcemia, AKI   Reevaluation:  After the interventions noted  above, I reevaluated the patient and found that they have: improved  Disposition:  After consideration of the diagnostic results and the patients response to treatment, I feel that the patent would benefit from admission.    CRITICAL CARE Performed by: Valarie Merino   Total critical care time: 45 minutes  Critical care time was exclusive of separately billable procedures and treating other patients.  Critical care was necessary to treat or prevent imminent or life-threatening deterioration.  Critical care was time spent personally by me on the following activities: development of treatment plan with patient and/or surrogate as well as nursing, discussions with consultants, evaluation of patient's response to treatment, examination of patient, obtaining history from patient or surrogate, ordering and performing treatments and interventions, ordering and review of laboratory studies, ordering and review of radiographic studies, pulse oximetry and re-evaluation of patient's condition.         Final Clinical Impression(s) / ED Diagnoses Final diagnoses:  Fall, initial encounter  Injury of head, initial encounter  Atrial fibrillation with RVR ([place])  Alcohol withdrawal syndrome without complication ([place])  Hyponatremia  AKI (acute kidney injury) ([place])    Rx / DC Orders ED Discharge Orders     None         Valarie Merino, MD 08/28/2022 1147

## 2022-08-30 NOTE — ED Triage Notes (Addendum)
Patient reported to have a fall when getting out of bed.  He fell onto hardwood flooring.  He has laceration to the left forehead.  He denies any loc.  He does have nv which has been ongoing for one month.  He denies neck or back pain.  He arrives reporting he cannot get comfortable.  He is on xeralto for Afib.  Patient received 4 zforan and 500 cc normal saline.    Patient cbg 186.  HR is 130 bp 150/100.  98% on room air.  Patient now reports he is having worse back pain since the fall, he has hx of back pain.  He is noted to have frequent repositioning.  Bil lower extremities with abrasions and swelling

## 2022-08-30 NOTE — ED Notes (Signed)
ED TO INPATIENT HANDOFF REPORT  ED Nurse Name and Phone #: Danise Edge Name/Age/Gender Douglas Ballard 72 y.o. male Room/Bed: RESB/RESB  Code Status   Code Status: Full Code  Home/SNF/Other Home Patient oriented to: self, place, and time Is this baseline? No   Triage Complete: Triage complete  Chief Complaint Hyponatremia [E87.1]  Triage Note Patient reported to have a fall when getting out of bed.  He fell onto hardwood flooring.  He has laceration to the left forehead.  He denies any loc.  He does have nv which has been ongoing for one month.  He denies neck or back pain.  He arrives reporting he cannot get comfortable.  He is on xeralto for Afib.  Patient received 4 zforan and 500 cc normal saline.    Patient cbg 186.  HR is 130 bp 150/100.  98% on room air.  Patient now reports he is having worse back pain since the fall, he has hx of back pain.  He is noted to have frequent repositioning.  Bil lower extremities with abrasions and swelling   Allergies Allergies  Allergen Reactions   Hydrochlorothiazide Other (See Comments)    Pt gets hyponatremia   Lasix [Furosemide] Other (See Comments)    Sodium levels drop when take   Nsaids Other (See Comments)    Kidney disease/failure   Sulfa Antibiotics Other (See Comments)    headaches    Level of Care/Admitting Diagnosis ED Disposition     ED Disposition  Admit   Condition  --   Comment  Hospital Area: [facility] [[id number]]  Level of Care: ICU [6]  May admit patient to [facility] or [facility] if equivalent level of care is available:: Yes  Covid Evaluation: Asymptomatic - no recent exposure (last 10 days) testing not required  Diagnosis: Hyponatremia [198519]  Admitting Physician: Maryjane Hurter [[id number]]  Attending Physician: Maryjane Hurter [[id number]]  Certification:: I certify this patient will need inpatient services for at least 2 midnights  Estimated Length of Stay: 4           B Medical/Surgery History Past Medical History:  Diagnosis Date   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    Anxiety    Atrial fibrillation ([place])    Atypical mole 05/13/2008   LEFT MEDIAL CHEST SLIGHT/MOD.   Atypical mole 10/12/2020   Left Upper Arm-Posterior (moderate to severe)   Atypical mole 10/12/2020   Right Mid Back (Moderate)   Atypical nevi 05/13/2008   LEFT LATERAL CHEST SLIGHT   Atypical nevi 04/29/2015   RIGHT POST SHOULDER MOD/SEV. [place] W/S   Atypical nevi 04/29/2015   RIGHT MID ABDOMEN MODERATE [place] W/S   Atypical nevi 09/27/2015   LEFT UPPER ARM SEVERE TX W/S   Atypical nevi 09/27/2015   MID LOWER BACK MODERATE   Atypical nevi 09/27/2015   MID UPPER BACK SEVERE TX EXC   Atypical nevi 09/27/2015   LEFT UPPER BACK MOD.SEV TX EXC   Atypical nevi 03/23/2016   RIGHT LOWER BACK MILD   Atypical nevi 03/23/2016   LEFT SIDE TORSO MILD   Atypical nevi 06/14/2016   LEFT LOWER BACK MOD/SEV    Atypical nevi 06/14/2016   LEFT CHEST MODERATE   Atypical nevi 08/30/2017   MID UPPER BACK SUP MODERATE   Atypical nevi 08/30/2017   RIGHT CHEST SEVERE  Atypical nevi 07/25/2018   LEFT SHOULDER ANTERIOR TX WIDERSHAVE   Atypical nevi 07/25/2018   LEFT SHOULDER POST MODERATE   Atypical nevi 08/25/2019   LEFT SIDE ABDOMEN MODERATE FREE   Atypical nevi 11/27/2019   RIGHT SHOULDER MODERATE TX W/S   Atypical nevi 11/27/2019   RIGHT UPPER BACK MOD/SEVERE TX W/S   Atypical nevi 11/27/2019   LEFT MID BACK SEVERE TX W/S   Cancer ([place]) 2012   Prostate surgery   Chronic diastolic CHF (congestive heart failure) ([place]) 11/01/2014   Echo 8/15: Mild LVH, EF 50-55%, moderate BAE  //  b. Echo 7/17: EF 55-60%, normal wall motion, grade 2 diastolic dysfunction, MAC, mild MR, moderate LAE, trivial PI   Chronic kidney disease    Compression fracture of thoracic  vertebra ([place]) 06/26/2011   Diabetes mellitus type 2 in nonobese (HCC)    Fistula, bladder    Frequent urination at night    History of adenomatous polyp of colon 06/14/2014   History of nuclear stress test    a. Myoview 10/15: Overall Impression:  Low risk stress nuclear study demonstrating mild baseline ST-T changes with normal myocardial perfusion and low normal EF of 50%. // b.Myoview 7/17: EF 50%, no ischemia or scar, low risk (EF normal by recent echo)   Hypertension    PNA (pneumonia) 06/26/2011   Sepsis due to urinary tract infection ([place]) 04/16/2014   Stroke ([place]) sept 1, 2015   tia x 3   Past Surgical History:  Procedure Laterality Date   BIOPSY  03/16/2020   Procedure: BIOPSY;  Surgeon: Lavena Bullion, DO;  [facility];  Service: Gastroenterology;;   COLON SURGERY     COLONOSCOPY N/A 06/14/2014   Procedure: COLONOSCOPY;  Surgeon: Gatha Mayer, MD;  [facility];  Service: Endoscopy;  Laterality: N/A;   COLONOSCOPY WITH PROPOFOL N/A 03/16/2020   Procedure: COLONOSCOPY WITH PROPOFOL;  Surgeon: Lavena Bullion, DO;  [facility];  Service: Gastroenterology;  Laterality: N/A;   CYSTOSCOPY WITH STENT PLACEMENT Bilateral 06/15/2014   Procedure: CYSTOSCOPY WITH BILATERAL STENT PLACEMENT;  Surgeon: Bernestine Amass, MD;  [facility];  Service: Urology;  Laterality: Bilateral;   ESOPHAGOGASTRODUODENOSCOPY (EGD) WITH PROPOFOL N/A 03/16/2020   Procedure: ESOPHAGOGASTRODUODENOSCOPY (EGD) WITH PROPOFOL;  Surgeon: Lavena Bullion, DO;  [facility];  Service: Gastroenterology;  Laterality: N/A;   LIPOMA EXCISION  2012   Dr Nonah Mattes   moles removed     Back   POLYPECTOMY  03/16/2020   Procedure: POLYPECTOMY;  Surgeon: Lavena Bullion, DO;  [facility];  Service: Gastroenterology;;   PROCTOSCOPY N/A 06/15/2014   Procedure: RIGID PROCTOSCOPY;  Surgeon: Michael Boston, MD;  [facility];  Service: General;  Laterality: N/A;    RADIOLOGY WITH ANESTHESIA N/A 07/28/2014   Procedure: Embolization;  Surgeon: Luanne Bras, MD;  Location: [facility];  Service: Radiology;  Laterality: N/A;   ROBOT ASSISTED LAPAROSCOPIC RADICAL PROSTATECTOMY  01/31/2011   Robotic-assisted laparoscopic radical retropubic     A IV Location/Drains/Wounds Patient Lines/Drains/Airways Status     Active Line/Drains/Airways     Name Placement date Placement time Site Days   Peripheral IV 08/27/2022 20 G Anterior;Left Forearm 09/06/2022  0845  Forearm  less than 1   Peripheral IV 09/16/2022 20 G Anterior;Right Forearm 08/22/2022  1134  Forearm  less than 1            Intake/Output Last 24 hours  Intake/Output Summary (Last 24 hours) at  08/29/2022 1443 Last data filed at 09/07/2022 1404 Gross per 24 hour  Intake 50 ml  Output --  Net 50 ml    Labs/Imaging Results for orders placed or performed during the hospital encounter of 09/03/2022 (from the past 48 hour(s))  Troponin I (High Sensitivity)     Status: Abnormal   Collection Time: 08/23/2022  8:55 AM  Result Value Ref Range   Troponin I (High Sensitivity) 36 (H) <18 ng/L    Comment: (NOTE) Elevated high sensitivity troponin I (hsTnI) values and significant  changes across serial measurements may suggest ACS but many other  chronic and acute conditions are known to elevate hsTnI results.  Refer to the "Links" section for chest pain algorithms and additional  guidance. Performed at [facility], [place] [address] [place], [place] [address]   CBC with Differential     Status: Abnormal   Collection Time: 09/18/2022  8:55 AM  Result Value Ref Range   WBC 19.8 (H) 4.0 - 10.5 K/uL   RBC 3.24 (L) 4.22 - 5.81 MIL/uL   Hemoglobin 10.3 (L) 13.0 - 17.0 g/dL   HCT 28.3 (L) 39.0 - 52.0 %   MCV 87.3 80.0 - 100.0 fL   MCH 31.8 26.0 - 34.0 pg   MCHC 36.4 (H) 30.0 - 36.0 g/dL   RDW 11.2 (L) 11.5 - 15.5 %   Platelets 185 150 - 400 K/uL   nRBC 0.0 0.0 - 0.2 %   Neutrophils  Relative % 86 %   Neutro Abs 17.0 (H) 1.7 - 7.7 K/uL   Lymphocytes Relative 2 %   Lymphs Abs 0.5 (L) 0.7 - 4.0 K/uL   Monocytes Relative 10 %   Monocytes Absolute 2.0 (H) 0.1 - 1.0 K/uL   Eosinophils Relative 0 %   Eosinophils Absolute 0.0 0.0 - 0.5 K/uL   Basophils Relative 0 %   Basophils Absolute 0.0 0.0 - 0.1 K/uL   Immature Granulocytes 2 %   Abs Immature Granulocytes 0.33 (H) 0.00 - 0.07 K/uL    Comment: Performed at [facility], [place] [address] [place],  [address]  Comprehensive metabolic panel     Status: Abnormal   Collection Time: 08/26/2022  8:55 AM  Result Value Ref Range   Sodium 113 (LL) 135 - 145 mmol/L    Comment: CRITICAL RESULT CALLED TO, READ BACK BY AND VERIFIED WITH JOHNSON,C. RN AT 1003 09/19/2022 MULLINS,T    Potassium 4.5 3.5 - 5.1 mmol/L   Chloride 79 (L) 98 - 111 mmol/L   CO2 15 (L) 22 - 32 mmol/L   Glucose, Bld 179 (H) 70 - 99 mg/dL    Comment: Glucose reference range applies only to samples taken after fasting for at least 8 hours.   BUN 64 (H) 8 - 23 mg/dL   Creatinine, Ser 5.11 (H) 0.61 - 1.24 mg/dL   Calcium 4.9 (LL) 8.9 - 10.3 mg/dL    Comment: CRITICAL RESULT CALLED TO, READ BACK BY AND VERIFIED WITH JOHNSON,C. RN AT 1003 09/19/2022 MULLINS,T    Total Protein 6.9 6.5 - 8.1 g/dL   Albumin 3.1 (L) 3.5 - 5.0 g/dL   AST 712 (H) 15 - 41 U/L   ALT 553 (H) 0 - 44 U/L   Alkaline Phosphatase 75 38 - 126 U/L   Total Bilirubin 2.1 (H) 0.3 - 1.2 mg/dL   GFR, Estimated 11 (L) >60 mL/min    Comment: (NOTE) Calculated using the CKD-EPI Creatinine Equation (2021)  Anion gap 19 (H) 5 - 15    Comment: Performed at [facility], [place] [address], [place], [place] [address]  Type and screen [place]     Status: None   Collection Time: 09/13/2022  8:55 AM  Result Value Ref Range   ABO/RH(D) O POS    Antibody Screen NEG    Sample Expiration      09/02/2022,2359 Performed at [facility], [place] [address] [place], [place] [address]   Brain natriuretic peptide     Status: Abnormal   Collection Time: 09/03/2022  8:55 AM  Result Value Ref Range   B Natriuretic Peptide 2,970.3 (H) 0.0 - 100.0 pg/mL    Comment: Performed at [facility], [place] [address], [place], [place] [address]  Magnesium     Status: Abnormal   Collection Time: 09/16/2022  8:55 AM  Result Value Ref Range   Magnesium 0.7 (LL) 1.7 - 2.4 mg/dL    Comment: CRITICAL RESULT CALLED TO, READ BACK BY AND VERIFIED WITH LAMB,S. RN AT 1120 08/23/2022 MULLINS,T Performed at [facility], [place] [address], [place], [place] [address]   Phosphorus     Status: Abnormal   Collection Time: 08/28/2022  8:55 AM  Result Value Ref Range   Phosphorus 5.3 (H) 2.5 - 4.6 mg/dL    Comment: Performed at [facility], [place] [address] [place], [place] [address]  Ethanol     Status: None   Collection Time: 08/28/2022  9:57 AM  Result Value Ref Range   Alcohol, Ethyl (B) <10 <10 mg/dL    Comment: (NOTE) Lowest detectable limit for serum alcohol is 10 mg/dL.  For medical purposes only. Performed at [facility], [place] [address], [place], [place] [address]   Troponin I (High Sensitivity)     Status: Abnormal   Collection Time: 08/27/2022 11:30 AM  Result Value Ref Range   Troponin I (High Sensitivity) 37 (H) <18 ng/L    Comment: (NOTE) Elevated high sensitivity troponin I (hsTnI) values and significant  changes across serial measurements may suggest ACS but many other  chronic and acute conditions are known to elevate hsTnI results.  Refer to the "Links" section for chest pain algorithms and additional  guidance. Performed at [facility], [place] [address], [place], [place] [address]   Ammonia     Status: None   Collection Time: 09/14/2022 11:31 AM  Result Value Ref Range   Ammonia 26 9 - 35 umol/L    Comment: Performed at [facility], [place] [address], [place], [place] [address]  Lactic acid, plasma     Status: Abnormal   Collection Time: 08/24/2022 11:35 AM  Result Value Ref Range   Lactic Acid, Venous 2.2 (HH) 0.5 - 1.9 mmol/L    Comment: CRITICAL RESULT CALLED TO, READ BACK BY AND VERIFIED WITH Junious Silk. RN AT 1200 09/13/2022 MULLINS,T Performed at [facility], [place] [address], [place], [place] [address]   Acetaminophen level     Status: Abnormal   Collection Time: 08/27/2022 12:22 PM  Result Value Ref Range   Acetaminophen (Tylenol), Serum <10 (L) 10 - 30 ug/mL    Comment: (NOTE) Therapeutic concentrations vary significantly. A range of 10-30 ug/mL  may be an effective concentration for many patients. However, some  are best treated at concentrations outside of this range. Acetaminophen concentrations >150 ug/mL at 4 hours after ingestion  and >  50 ug/mL at 12 hours after ingestion are often associated with  toxic reactions.  Performed at [facility], [place] [address], [place], [place] [address]   Lipase, blood     Status: None   Collection Time: 09/11/2022 12:22 PM  Result Value Ref Range   Lipase 49 11 - 51 U/L    Comment: Performed at [facility], [place] [address] [place], [place] [address]  Sodium     Status: Abnormal   Collection Time: 08/29/2022 12:22 PM  Result Value Ref Range   Sodium 114 (LL) 135 - 145 mmol/L    Comment: CRITICAL RESULT CALLED TO, READ BACK BY AND VERIFIED WITH rn p dowd at 1319 09/12/2022 [place] a Performed at [facility], [place] [address], [place], [place] [address]   TSH     Status: None   Collection Time: 09/02/2022 12:23 PM  Result Value Ref Range   TSH 1.317 0.350 - 4.500 uIU/mL    Comment: Performed by a 3rd Generation assay with a functional sensitivity of <=0.01 uIU/mL. Performed at [facility], [place] [address] [place], [place] [address]   CBG monitoring, ED      Status: Abnormal   Collection Time: 08/29/2022 12:55 PM  Result Value Ref Range   Glucose-Capillary 184 (H) 70 - 99 mg/dL    Comment: Glucose reference range applies only to samples taken after fasting for at least 8 hours.  APTT     Status: Abnormal   Collection Time: 08/31/2022  1:36 PM  Result Value Ref Range   aPTT 47 (H) 24 - 36 seconds    Comment:        IF BASELINE aPTT IS ELEVATED, SUGGEST PATIENT RISK ASSESSMENT BE USED TO DETERMINE APPROPRIATE ANTICOAGULANT THERAPY. Performed at [facility], [place] [address], [place], [place] [address]   Heparin level (unfractionated)     Status: Abnormal   Collection Time: 09/05/2022  1:36 PM  Result Value Ref Range   Heparin Unfractionated >1.10 (H) 0.30 - 0.70 IU/mL    Comment: (NOTE) The clinical reportable range upper limit is being lowered to >1.10 to align with the FDA approved guidance for the current laboratory assay.  If heparin results are below expected values, and patient dosage has  been confirmed, suggest follow up testing of antithrombin III levels. Performed at [facility], [place] [address] [place], [place] [address]   Protime-INR     Status: Abnormal   Collection Time: 08/20/2022  1:36 PM  Result Value Ref Range   Prothrombin Time 40.4 (H) 11.4 - 15.2 seconds   INR 4.2 (HH) 0.8 - 1.2    Comment: CRITICAL RESULT CALLED TO, READ BACK BY AND VERIFIED WITH: SAVOIE,B. RN _0  ON 1.11.2024 BY NMCCOY (NOTE) INR goal varies based on device and disease states. Performed at [facility], [place] [address], [place], [place] [address]    US RENAL  Result Date: 09/11/2022 CLINICAL DATA:  Acute kidney injury. EXAM: RENAL / URINARY TRACT ULTRASOUND COMPLETE COMPARISON:  Eight twenty six two thousand fifteen FINDINGS: Right Kidney: Renal measurements: 11.7 x 6.5 x 4.5 cm = volume: 176 mL. Echogenicity within normal limits. No mass or hydronephrosis visualized. Left Kidney: Renal  measurements: 10.2 x 5.2 x 3.8 cm = volume: 104 mL. Echogenicity within normal limits. No mass or hydronephrosis visualized. Bladder: Appears normal for degree of bladder distention. Other: None. IMPRESSION: Normal examination. Electronically Signed   By: Remo Lipps  Joneen Caraway M.D.   On: 09/06/2022 14:02   CT Cervical Spine Wo Contrast  Result Date: 09/08/2022 CLINICAL DATA:  Trauma EXAM: CT CERVICAL SPINE WITHOUT CONTRAST TECHNIQUE: Multidetector CT imaging of the cervical spine was performed without intravenous contrast. Multiplanar CT image reconstructions were also generated. RADIATION DOSE REDUCTION: This exam was performed according to the departmental dose-optimization program which includes automated exposure control, adjustment of the mA and/or kV according to patient size and/or use of iterative reconstruction technique. COMPARISON:  None Available. FINDINGS: Alignment: Alignment of posterior margins of vertebral bodies is unremarkable. There is minimal levoscoliosis. Skull base and vertebrae: No recent fracture is seen. Degenerative changes are noted. Soft tissues and spinal canal: Posterior bony spurs are causing extrinsic pressure over the ventral margin of thecal sac at C5-C6 and C6-C7 levels with spinal stenosis. Disc levels: There is encroachment of neural foramina from C2 to C7 levels. Upper chest: Motion artifacts limit evaluation. Other: Unremarkable. IMPRESSION: No recent fracture is seen in cervical spine. Cervical spondylosis with encroachment of neural foramina and spinal stenosis at multiple levels, more so at C5-C6 and C6-C7 levels. Electronically Signed   By: Elmer Picker M.D.   On: 08/31/2022 10:09   CT Head Wo Contrast  Result Date: 08/20/2022 CLINICAL DATA:  Trauma EXAM: CT HEAD WITHOUT CONTRAST TECHNIQUE: Contiguous axial images were obtained from the base of the skull through the vertex without intravenous contrast. RADIATION DOSE REDUCTION: This exam was performed according to  the departmental dose-optimization program which includes automated exposure control, adjustment of the mA and/or kV according to patient size and/or use of iterative reconstruction technique. COMPARISON:  04/19/2017 FINDINGS: Brain: No acute intracranial findings are seen in noncontrast CT brain. There are no signs of bleeding within the cranium. Ventricles are not dilated. There is no shift of midline structures. There is decreased density in periventricular region in left frontal lobe, possibly small-vessel disease or old infarct with no significant interval change. Vascular: Unremarkable. Skull: No fracture is seen in calvarium. There are small pockets of air in subcutaneous plane in the left frontotemporal region. Skin staple is seen in the left frontal region. Sinuses/Orbits: There is mild mucosal thickening in maxillary sinuses. There are no air-fluid levels. Other: None. IMPRESSION: No acute intracranial findings are seen. No fracture is seen in calvarium. There are few tiny pockets of air in subcutaneous plane in the left frontotemporal region, possibly related to skin laceration. Mild chronic sinusitis. Electronically Signed   By: Elmer Picker M.D.   On: 09/04/2022 10:05   DG Ankle Complete Left  Result Date: 09/02/2022 CLINICAL DATA:  Fall. EXAM: LEFT ANKLE COMPLETE - 3+ VIEW COMPARISON:  None Available. FINDINGS: There is no evidence of fracture, dislocation, or joint effusion. There is no evidence of arthropathy or other focal bone abnormality. Soft tissues are unremarkable. IMPRESSION: Negative. Electronically Signed   By: Marijo Conception M.D.   On: 09/04/2022 09:45   DG Lumbar Spine Complete  Result Date: 08/20/2022 CLINICAL DATA:  Fall. EXAM: LUMBAR SPINE - COMPLETE 4+ VIEW COMPARISON:  None Available. FINDINGS: There is no evidence of lumbar spine fracture. Alignment is normal. Moderate degenerative disc disease is seen at L5-S1 with anterior osteophyte formation. IMPRESSION: Moderate  degenerative disc disease at L5-S1. No acute abnormality seen. Aortic Atherosclerosis (ICD10-I70.0). Electronically Signed   By: Marijo Conception M.D.   On: 08/20/2022 09:43   DG Pelvis 1-2 Views  Result Date: 09/05/2022 CLINICAL DATA:  Fall. EXAM: PELVIS - 1-2 VIEW COMPARISON:  None Available. FINDINGS: There is no evidence of pelvic fracture or diastasis. No pelvic bone lesions are seen. IMPRESSION: Negative. Electronically Signed   By: Marijo Conception M.D.   On: 09/16/2022 09:40   DG Ankle Complete Right  Result Date: 08/27/2022 CLINICAL DATA:  Right ankle pain after fall. EXAM: RIGHT ANKLE - COMPLETE 3+ VIEW COMPARISON:  None Available. FINDINGS: There is no evidence of fracture, dislocation, or joint effusion. There is no evidence of arthropathy or other focal bone abnormality. Soft tissues are unremarkable. IMPRESSION: Negative. Electronically Signed   By: Marijo Conception M.D.   On: 09/15/2022 09:40   DG Chest Port 1 View  Result Date: 08/27/2022 CLINICAL DATA:  Fall. EXAM: PORTABLE CHEST 1 VIEW COMPARISON:  March 14, 2020. FINDINGS: Mild cardiomegaly is noted with mild central pulmonary vascular congestion. No consolidative process is noted. The visualized skeletal structures are unremarkable. IMPRESSION: Mild cardiomegaly with mild central pulmonary vascular congestion. Electronically Signed   By: Marijo Conception M.D.   On: 09/06/2022 09:38    Pending Labs Unresulted Labs (From admission, onward)     Start     Ordered   08/31/22 0500  CBC  Tomorrow morning,   R        09/04/2022 1133   08/31/22 4270  Basic metabolic panel  Tomorrow morning,   R        08/26/2022 1133   08/31/22 0500  Magnesium  Tomorrow morning,   R        09/02/2022 1133   08/31/22 0500  Hepatic function panel  Tomorrow morning,   R        08/25/2022 1221   08/31/22 0500  Protime-INR  Tomorrow morning,   R        09/07/2022 1428   08/31/22 0100  APTT  Once-Timed,   TIMED        08/26/2022 1409   09/07/2022 2000  Magnesium   Once,   R        08/22/2022 1213   08/21/2022 1227  Osmolality, urine  Once,   R        09/08/2022 1226   09/11/2022 1227  Na and K (sodium & potassium), rand urine  Once,   R        09/03/2022 1226   08/21/2022 1150  Culture, blood (Routine X 2) w Reflex to ID Panel  BLOOD CULTURE X 2,   R      09/05/2022 1149   08/26/2022 1138  Calcium, ionized  Add-on,   AD        08/24/2022 1137   08/23/2022 1136  Sodium  Now then every 6 hours,   R      08/28/2022 1135   08/24/2022 1133  Procalcitonin  Add-on,   AD        08/21/2022 1133   08/29/2022 1107  Rapid urine drug screen (hospital performed)  ONCE - STAT,   STAT        09/04/2022 1107   09/10/2022 0928  Lactic acid, plasma  Now then every 2 hours,   R      09/06/2022 0927   09/13/2022 0842  Urinalysis, Routine w reflex microscopic  Once,   URGENT        08/25/2022 0843            Vitals/Pain Today's Vitals   09/17/2022 1315 08/28/2022 1330 08/26/2022 1400 09/19/2022 1415  BP: 122/87 128/88 (!) 132/94 (!) 145/92  Pulse: 100 Marland Kitchen)  112 (!) 104 (!) 104  Resp: (!) 23 (!) 22 20 (!) 22  SpO2: 98% 98% 99% 96%  Weight:      Height:      PainSc:        Isolation Precautions No active isolations  Medications Medications  docusate sodium (COLACE) capsule 100 mg (has no administration in time range)  polyethylene glycol (MIRALAX / GLYCOLAX) packet 17 g (has no administration in time range)  magnesium sulfate IVPB 4 g 100 mL (4 g Intravenous New Bag/Given 09/05/2022 1259)  thiamine (VITAMIN B1) 500 mg in normal saline (50 mL) IVPB (500 mg Intravenous New Bag/Given 09/15/2022 1307)    Followed by  thiamine (VITAMIN B1) 250 mg in sodium chloride 0.9 % 50 mL IVPB (has no administration in time range)  cefTRIAXone (ROCEPHIN) 2 g in sodium chloride 0.9 % 100 mL IVPB (2 g Intravenous New Bag/Given 09/05/2022 1258)  dexmedetomidine (PRECEDEX) 200 MCG/50ML (4 mcg/mL) infusion (has no administration in time range)  insulin aspart (novoLOG) injection 0-9 Units (2 Units Subcutaneous Given 08/24/2022  1259)  morphine (PF) 4 MG/ML injection 4 mg (4 mg Intravenous Given 08/23/2022 0852)  Tdap (BOOSTRIX) injection 0.5 mL (0.5 mLs Intramuscular Given 09/12/2022 0854)  ondansetron (ZOFRAN) injection 4 mg (4 mg Intravenous Given 08/29/2022 0852)  LORazepam (ATIVAN) injection 1 mg (1 mg Intravenous Given 08/26/2022 0949)  sodium chloride 0.9 % bolus 500 mL (0 mLs Intravenous Stopped 09/01/2022 1155)  LORazepam (ATIVAN) injection 1 mg (1 mg Intravenous Given 08/29/2022 1041)  sodium bicarbonate injection 100 mEq (100 mEq Intravenous Given 09/17/2022 1249)  furosemide (LASIX) 120 mg in dextrose 5 % 50 mL IVPB (0 mg Intravenous Stopped 08/31/2022 1404)    Mobility walks High fall risk   Focused Assessments    R Recommendations: See Admitting Provider Note  Report given to:   Additional Notes:

## 2022-08-30 NOTE — Progress Notes (Signed)
Pt seen and examined. Here w/ AKI on CKD, SOB, possible CHF (+ CXR, no edema), bad LV 25-30%, confusion, etoh withdrawal and hyponatremia, Na+ 114. Will start 3% saline at 36 cc/hr, use w/ caution in this pt w/ hx etoh abuse. Would give IV lasix but he is allergic to lasix and to sulfa medications, will ask pharmacy if there is another IV diuretic w/ similar effects. This is the plan for now, full note to follow.   Kelly Splinter, MD 08/28/2022, 5:38 PM  Recent Labs  Lab 09/05/2022 0855  HGB 10.3*  ALBUMIN 3.1*  CALCIUM 4.9*  PHOS 5.3*  CREATININE 5.11*  K 4.5

## 2022-08-30 NOTE — Progress Notes (Signed)
[facility] Progress Note Patient Name: Douglas Ballard DOB: 12/15/1950 MRN: [id number]   Date of Service  09/07/2022  HPI/Events of Note  Multiple issues: 1. Hypomagnesemia - Mg++ = 1.4 and Creatinine = 5.11. 2. Na+ = 115 - 3% NaCl order written at 6:30 PM. Will continue to trend Na+ at current 3% NaCl IV infusion rate.   eICU Interventions  Plan: Replace Mg++. Continue to trend Na+.     Intervention Category Major Interventions: Electrolyte abnormality - evaluation and management  Lysle Dingwall 09/03/2022, 8:43 PM

## 2022-08-30 NOTE — Consult Note (Signed)
Renal Service Consult Note [facility]  Douglas Ballard 08/28/2022 Sol Blazing, MD Requesting Physician: Dr Verlee Monte  Reason for Consult: Hyponatremia, renal failure HPI: The patient is a 72 y.o. year-old w/ PMH as below who presented from home after a fall. Also c/o N/V for 1 month. In ED pt was in afib w/ RVR, BP's 140s. While in ER pt c/o neck, back and foot pain then became agitated and altered. There was concern for etoh w/d. Pt rec'd IV Ativan. Labs showed Na+ 113, creat 4.5 (prior 3.26 in sept 2022), ^LFT's, bnp 2970, trop 36, WBC 19k, Hb 10. CXR showed CM, vasc congestion and R perihilar early edema. Pt rec'd 1 L LR in ED, PCCM admitted. We are asked to see for hyponatremia and renal failure.   Pt seen in ICU bed.  Pt is confused and sig altered, no hx obtained.    ROS - denies CP, no joint pain, no HA, no blurry vision, no rash, no diarrhea, no nausea/ vomiting, no dysuria, no difficulty voiding   Past Medical History  Past Medical History:  Diagnosis Date   Alcohol withdrawal seizure ([place]) 06/28/2011   Alcohol withdraw seizure prior to admission is suspected from history given by family & Post ictal appearance in the ED.    Anxiety    Atrial fibrillation ([place])    Atypical mole 05/13/2008   LEFT MEDIAL CHEST SLIGHT/MOD.   Atypical mole 10/12/2020   Left Upper Arm-Posterior (moderate to severe)   Atypical mole 10/12/2020   Right Mid Back (Moderate)   Atypical nevi 05/13/2008   LEFT LATERAL CHEST SLIGHT   Atypical nevi 04/29/2015   RIGHT POST SHOULDER MOD/SEV. [place] W/S   Atypical nevi 04/29/2015   RIGHT MID ABDOMEN MODERATE [place] W/S   Atypical nevi 09/27/2015   LEFT UPPER ARM SEVERE TX W/S   Atypical nevi 09/27/2015   MID LOWER BACK MODERATE   Atypical nevi 09/27/2015   MID UPPER BACK SEVERE TX EXC   Atypical nevi 09/27/2015   LEFT UPPER BACK MOD.SEV TX EXC   Atypical nevi 03/23/2016   RIGHT LOWER BACK MILD   Atypical nevi 03/23/2016   LEFT SIDE TORSO  MILD   Atypical nevi 06/14/2016   LEFT LOWER BACK MOD/SEV    Atypical nevi 06/14/2016   LEFT CHEST MODERATE   Atypical nevi 08/30/2017   MID UPPER BACK SUP MODERATE   Atypical nevi 08/30/2017   RIGHT CHEST SEVERE    Atypical nevi 07/25/2018   LEFT SHOULDER ANTERIOR TX WIDERSHAVE   Atypical nevi 07/25/2018   LEFT SHOULDER POST MODERATE   Atypical nevi 08/25/2019   LEFT SIDE ABDOMEN MODERATE FREE   Atypical nevi 11/27/2019   RIGHT SHOULDER MODERATE TX W/S   Atypical nevi 11/27/2019   RIGHT UPPER BACK MOD/SEVERE TX W/S   Atypical nevi 11/27/2019   LEFT MID BACK SEVERE TX W/S   Cancer ([place]) 2012   Prostate surgery   Chronic diastolic CHF (congestive heart failure) ([place]) 11/01/2014   Echo 8/15: Mild LVH, EF 50-55%, moderate BAE  //  b. Echo 7/17: EF 55-60%, normal wall motion, grade 2 diastolic dysfunction, MAC, mild MR, moderate LAE, trivial PI   Chronic kidney disease    Compression fracture of thoracic vertebra ([place]) 06/26/2011   Diabetes mellitus type 2 in nonobese (HCC)    Fistula, bladder    Frequent urination at night    History of adenomatous polyp of colon 06/14/2014   History of nuclear stress test  a. Myoview 10/15: Overall Impression:  Low risk stress nuclear study demonstrating mild baseline ST-T changes with normal myocardial perfusion and low normal EF of 50%. // b.Myoview 7/17: EF 50%, no ischemia or scar, low risk (EF normal by recent echo)   Hypertension    PNA (pneumonia) 06/26/2011   Sepsis due to urinary tract infection ([place]) 04/16/2014   Stroke ([place]) sept 1, 2015   tia x 3   Past Surgical History  Past Surgical History:  Procedure Laterality Date   BIOPSY  03/16/2020   Procedure: BIOPSY;  Surgeon: Lavena Bullion, DO;  Location: [facility];  Service: Gastroenterology;;   COLON SURGERY     COLONOSCOPY N/A 06/14/2014   Procedure: COLONOSCOPY;  Surgeon: Gatha Mayer, MD;  Location: [facility];  Service: Endoscopy;  Laterality: N/A;   COLONOSCOPY WITH  PROPOFOL N/A 03/16/2020   Procedure: COLONOSCOPY WITH PROPOFOL;  Surgeon: Lavena Bullion, DO;  Location: [facility];  Service: Gastroenterology;  Laterality: N/A;   CYSTOSCOPY WITH STENT PLACEMENT Bilateral 06/15/2014   Procedure: CYSTOSCOPY WITH BILATERAL STENT PLACEMENT;  Surgeon: Bernestine Amass, MD;  Location: [facility];  Service: Urology;  Laterality: Bilateral;   ESOPHAGOGASTRODUODENOSCOPY (EGD) WITH PROPOFOL N/A 03/16/2020   Procedure: ESOPHAGOGASTRODUODENOSCOPY (EGD) WITH PROPOFOL;  Surgeon: Lavena Bullion, DO;  [facility]: [facility];  Service: Gastroenterology;  Laterality: N/A;   LIPOMA EXCISION  2012   Dr Nonah Mattes   moles removed     Back   POLYPECTOMY  03/16/2020   Procedure: POLYPECTOMY;  Surgeon: Lavena Bullion, DO;  [facility]: [facility];  Service: Gastroenterology;;   PROCTOSCOPY N/A 06/15/2014   Procedure: RIGID PROCTOSCOPY;  Surgeon: Michael Boston, MD;  Location: [facility];  Service: General;  Laterality: N/A;   RADIOLOGY WITH ANESTHESIA N/A 07/28/2014   Procedure: Embolization;  Surgeon: Luanne Bras, MD;  Location: [facility];  Service: Radiology;  Laterality: N/A;   ROBOT ASSISTED LAPAROSCOPIC RADICAL PROSTATECTOMY  01/31/2011   Robotic-assisted laparoscopic radical retropubic   Family History  Family History  Problem Relation Age of Onset   Atrial fibrillation Father        onset 59s. Had pacemaker placed for sinus pause   Prostate cancer Father    Breast cancer Mother    Lung cancer Mother    Leukemia Brother    Colon polyps Brother    Colon cancer Neg Hx    Diabetes Neg Hx    Social History  reports that he quit smoking about 8 years ago. His smoking use included cigarettes. He has a 50.00 pack-year smoking history. He has never used smokeless tobacco. He reports current alcohol use of about 7.0 - 10.0 standard drinks of alcohol per week. He reports that he does not use drugs. Allergies  Allergies  Allergen Reactions   Hydrochlorothiazide Other (See  Comments)    Pt gets hyponatremia   Lasix [Furosemide] Other (See Comments)    Sodium levels drop when take   Nsaids Other (See Comments)    Kidney disease/failure   Sulfa Antibiotics Other (See Comments)    headaches   Home medications Prior to Admission medications   Medication Sig Start Date End Date Taking? Authorizing Provider  acetaminophen (TYLENOL) 500 MG tablet Take 500 mg by mouth as needed for moderate pain or headache.   Yes [provider]  cloNIDine (CATAPRES) 0.1 MG tablet TAKE 1 TABLET EVERY DAY Patient taking differently: Take 0.1 mg by mouth at bedtime. 02/28/22  Yes Vivi Barrack, MD  Caroleen Hamman  325 (65 Fe) MG tablet TAKE 1 TABLET EVERY DAY (SUBSTITUTED FOR FEROSUL) Patient taking differently: Take 325 mg by mouth daily. 04/30/22  Yes Vivi Barrack, MD  folic acid (FOLVITE) 1 MG tablet TAKE 1 TABLET EVERY DAY 02/28/22  Yes Vivi Barrack, MD  furosemide (LASIX) 40 MG tablet Take 1 tablet (40 mg total) by mouth daily. 08/26/20  Yes [place], Peter M, MD  hydrOXYzine (ATARAX) 50 MG tablet TAKE 1 TABLET THREE TIMES DAILY AS NEEDED FOR ANXIETY AND INSOMNIA Patient taking differently: Take 100 mg by mouth every evening. 07/06/22  Yes Vivi Barrack, MD  isosorbide mononitrate (IMDUR) 60 MG 24 hr tablet Take 60 mg by mouth daily.   Yes [provider]  JANUVIA 100 MG tablet TAKE 1 TABLET EVERY DAY Patient taking differently: Take 100 mg by mouth at bedtime. 07/27/22  Yes Vivi Barrack, MD  labetalol (NORMODYNE) 200 MG tablet Take 1 tablet (200 mg total) by mouth 2 (two) times daily. 04/06/20  Yes Almyra Deforest, PA  lactose free nutrition (BOOST) LIQD Take 237 mLs by mouth daily.   Yes [provider]  pantoprazole (PROTONIX) 40 MG tablet TAKE 1 TABLET TWICE DAILY Patient taking differently: Take 40 mg by mouth 2 (two) times daily. 05/17/22  Yes Vivi Barrack, MD  Rivaroxaban (XARELTO) 15 MG TABS tablet Take 1 tablet (15 mg total) by mouth daily with  supper. Patient taking differently: Take 15 mg by mouth at bedtime. 02/01/22  Yes [place], Peter M, MD  triamcinolone cream (KENALOG) 0.1 % Apply topically 2 (two) times daily. Patient taking differently: Apply 1 Application topically as needed (itching). 11/12/19  Yes Sheffield, Kelli R, PA-C  zolpidem (AMBIEN) 10 MG tablet TAKE 1 TABLET (10 MG TOTAL) BY MOUTH AT BEDTIME AS NEEDED FOR SLEEP. Patient taking differently: Take 10 mg by mouth at bedtime. 06/21/22 08/24/2022 Yes Vivi Barrack, MD  eszopiclone 3 MG TABS Take 1 tablet (3 mg total) by mouth at bedtime. Take immediately before bedtime Patient not taking: Reported on 08/22/2022 10/16/21   Vivi Barrack, MD  hydrALAZINE (APRESOLINE) 50 MG tablet Take by mouth. Patient not taking: Reported on 09/19/2022 06/11/22   [provider]  Multiple Vitamin (MULTIVITAMIN WITH MINERALS) TABS tablet Take 1 tablet by mouth daily. Patient not taking: Reported on 09/16/2022 03/18/20   Hosie Poisson, MD  thiamine 100 MG tablet Take 1 tablet (100 mg total) by mouth daily. Patient not taking: Reported on 08/21/2022 03/18/20   Hosie Poisson, MD     Vitals:   08/21/2022 1415 08/26/2022 1446 08/28/2022 1609 09/15/2022 1700  BP: (!) 145/92 (!) 133/94 (!) 142/100 (!) 151/91  Pulse: (!) 104 (!) 101 (!) 112 (!) 116  Resp: (!) 22 (!) 21 (!) 21 (!) 21  Temp:  98 F (36.7 C) 97.6 F (36.4 C)   TempSrc:  Axillary Axillary   SpO2: 96% 95% 95% 98%  Weight:      Height:       Exam Gen awake but confused, follows simple commands, starting off  No rash, cyanosis or gangrene Sclera anicteric, throat clear  External JVD, no real JVD Chest R clear, rales L base RRR no MRG Abd soft ntnd no mass or ascites +bs GU normal male MS no joint effusions or deformity Ext no LE or UE edema, no wounds or ulcers Neuro as above, moves all ext, lethargic and confused       uA 1/11 - 100 prot, 0-5 wbc/ rbc  UNa  <10,  UCr 82     Assessment/ Plan: Hyponatremia - euvolemic  vs possibly hypervolemic. Could be beer potomania (low solute). Get UOsm and will start 3% saline due to confusion. Get TSH, cortisol. Pt does have low EF% and possible CHF on cxr. Will also plan IV bumex 2 mg bid, as tolerated (pt has allergy to lasix). Will follow.  AKI on CKD4 - b/l creatinine 2.9- 3.2 from 2022, eGFR 18-22. Creat here is 5.11. Will try and get some earlier creat levels from this year.  Vol - euvolemic on exam, vasc congestion w/ R perihilar edema on CXR. HTN - on 3-4 BP lowering meds at home. BP's here are stable.  Volume -  no edema or ascites by exam, CXR as above Etoh abuse - per pmd AMS - multifactorial due to low Na, etoh w/d, other Atrial fib - sp diltiazem HFrEF - echo showing LVEF 25-30% H/o CVA      Rob Jaylanie Boschee  MD 08/29/2022, 5:39 PM Recent Labs  Lab 09/03/2022 0855  HGB 10.3*  ALBUMIN 3.1*  CALCIUM 4.9*  PHOS 5.3*  CREATININE 5.11*  K 4.5   Inpatient medications:  insulin aspart  0-9 Units Subcutaneous Q4H    cefTRIAXone (ROCEPHIN)  IV 2 g (09/15/2022 1258)   dexmedetomidine (PRECEDEX) IV infusion 0.2 mcg/kg/hr (09/16/2022 1706)   sodium chloride (hypertonic)     thiamine (VITAMIN B1) injection 500 mg (09/04/2022 1307)   Followed by   Derrill Memo ON 09/01/2022] thiamine (VITAMIN B1) injection     docusate sodium, perflutren lipid microspheres (DEFINITY) IV suspension, polyethylene glycol

## 2022-08-30 NOTE — Progress Notes (Incomplete)
  Echocardiogram 2D Echocardiogram has been performed.  Douglas Ballard 09/12/2022, 5:02 PM

## 2022-08-30 NOTE — ED Notes (Addendum)
Unable to get oral temp.  Patient skin is cool to touch.  He has noted mottling to both lower extremities.  He is complaining most of the pain in the left ankle and knee.  He has abrasion to the left forehead.

## 2022-08-30 NOTE — ED Notes (Signed)
Critical Lab Values:  Sodium 113, Calcium 4.9.  MD notified and Primary RN notified

## 2022-08-30 NOTE — Progress Notes (Addendum)
09/12/2022 APP Douglas Ballard and I saw and evaluated the patient. Discussed with them and agree with their findings and plan as documented in the their note.  S:  11yM with history of AF, chroinc diastolic heart failure, CKD4, DM who presented from home with unwitnessed fall and head trauma with laceration on forehead. Has had a month of n/v as well.   In ED started on dilt gtt for AF/RVR and given fluid presuming on dry side.   O: Blood pressure (!) 131/92, pulse (!) 116, resp. rate (!) 24, height '5\' 9"'$  (1.753 m), weight 72.6 kg, SpO2 100 %.   Exam: Gen:       Chronically ill appearing, frail HEENT:  not tracking, nystagmus, roving eye movements Neck:      No masses, +JVD Lungs:    rhonchorous bilaterally; tachypneic CV:         IRIR; no murmurs Abd:      + bowel sounds; soft, non-tender; no palpable masses, no distension Ext:    2+ edema; lukewarm Neuro:    surprisingly follows commands   Bedside TTE with severely reduced EF, biventricular enlargement, distended IVC  A:  # Acute systolic heart failure, chronic diastolic heart failure - profile B minus # AF/RVR # Acute toxic metabolic encephalopathy - multiple lyte derangements possiblity of etoh wd, possibility of TBI # Severe hyponatremia, hypervolemic - probably multifactorial increased total body water, decreased solute intake # Sepsis with aspiration pneumonia # Leukocytosis  P:  - ceftriaxone, follow cx and narrow as able - CIWA ativan and precedex - diurese, if unable to diurese then discuss with renal - couple amps of bicarb - stop dilt gtt, not sure what we'll use for RVR with his long QTC and possibly low output state - formal TTE - spot EEG - heparin, hold xarelto  This patient is critically ill with multiple organ system failure; which, requires frequent high complexity decision making, assessment, support, evaluation, and titration of therapies. This was completed through the application of advanced monitoring  technologies and extensive interpretation of multiple databases. During this encounter critical care time was devoted to patient care services described in this note for 40 minutes.    [name]

## 2022-08-30 NOTE — Progress Notes (Signed)
ANTICOAGULATION CONSULT NOTE - Initial Consult  Pharmacy Consult for IV Heparin Indication: atrial fibrillation  Allergies  Allergen Reactions   Hydrochlorothiazide Other (See Comments)    Pt gets hyponatremia   Lasix [Furosemide] Other (See Comments)    Sodium levels drop when take   Nsaids Other (See Comments)    Kidney disease/failure   Sulfa Antibiotics Other (See Comments)    headaches    Patient Measurements: Height: _0  (175.3 cm) Weight: 72.6 kg (160 lb) IBW/kg (Calculated) : 70.7 Heparin Dosing Weight: 72.6 kg  Vital Signs: BP: 122/87 (01/11 1315) Pulse Rate: 100 (01/11 1315)  Labs: Recent Labs    09/14/2022 0855 09/02/2022 1130  HGB 10.3*  --   HCT 28.3*  --   PLT 185  --   CREATININE 5.11*  --   TROPONINIHS 36* 37*    Estimated Creatinine Clearance: 13.3 mL/min (A) (by C-G formula based on SCr of 5.11 mg/dL (H)).   Medical History: Past Medical History:  Diagnosis Date   Alcohol withdrawal seizure ([place]) 06/28/2011   Alcohol withdraw seizure prior to admission is suspected from history given by family & Post ictal appearance in the ED.    Anxiety    Atrial fibrillation ([place])    Atypical mole 05/13/2008   LEFT MEDIAL CHEST SLIGHT/MOD.   Atypical mole 10/12/2020   Left Upper Arm-Posterior (moderate to severe)   Atypical mole 10/12/2020   Right Mid Back (Moderate)   Atypical nevi 05/13/2008   LEFT LATERAL CHEST SLIGHT   Atypical nevi 04/29/2015   RIGHT POST SHOULDER MOD/SEV. [place] W/S   Atypical nevi 04/29/2015   RIGHT MID ABDOMEN MODERATE [place] W/S   Atypical nevi 09/27/2015   LEFT UPPER ARM SEVERE TX W/S   Atypical nevi 09/27/2015   MID LOWER BACK MODERATE   Atypical nevi 09/27/2015   MID UPPER BACK SEVERE TX EXC   Atypical nevi 09/27/2015   LEFT UPPER BACK MOD.SEV TX EXC   Atypical nevi 03/23/2016   RIGHT LOWER BACK MILD   Atypical nevi 03/23/2016   LEFT SIDE TORSO MILD   Atypical nevi 06/14/2016   LEFT LOWER BACK MOD/SEV    Atypical nevi  06/14/2016   LEFT CHEST MODERATE   Atypical nevi 08/30/2017   MID UPPER BACK SUP MODERATE   Atypical nevi 08/30/2017   RIGHT CHEST SEVERE    Atypical nevi 07/25/2018   LEFT SHOULDER ANTERIOR TX WIDERSHAVE   Atypical nevi 07/25/2018   LEFT SHOULDER POST MODERATE   Atypical nevi 08/25/2019   LEFT SIDE ABDOMEN MODERATE FREE   Atypical nevi 11/27/2019   RIGHT SHOULDER MODERATE TX W/S   Atypical nevi 11/27/2019   RIGHT UPPER BACK MOD/SEVERE TX W/S   Atypical nevi 11/27/2019   LEFT MID BACK SEVERE TX W/S   Cancer ([place]) 2012   Prostate surgery   Chronic diastolic CHF (congestive heart failure) ([place]) 11/01/2014   Echo 8/15: Mild LVH, EF 50-55%, moderate BAE  //  b. Echo 7/17: EF 55-60%, normal wall motion, grade 2 diastolic dysfunction, MAC, mild MR, moderate LAE, trivial PI   Chronic kidney disease    Compression fracture of thoracic vertebra ([place]) 06/26/2011   Diabetes mellitus type 2 in nonobese (HCC)    Fistula, bladder    Frequent urination at night    History of adenomatous polyp of colon 06/14/2014   History of nuclear stress test    a. Myoview 10/15: Overall Impression:  Low risk stress nuclear study demonstrating mild baseline ST-T changes with  normal myocardial perfusion and low normal EF of 50%. // b.Myoview 7/17: EF 50%, no ischemia or scar, low risk (EF normal by recent echo)   Hypertension    PNA (pneumonia) 06/26/2011   Sepsis due to urinary tract infection ([place]) 04/16/2014   Stroke ([place]) sept 1, 2015   tia x 3    Medications:  PTA medication list includes Xarelto 15 mg po qday with supper for afib    Last dose unknown; patient somnolent and unable to answer questions  Assessment: Pharmacy consulted to dose IV heparin for afib for this 72 yo male with AKI on CKD stage IV while Xarelto on hold.  On Xarelto prior to admission for afib and presented to ED after unwitnessed fall (denied LOC, and sustained a small laceration to the left temporal area).  CTH and cervical neg  for acute fx or intracranial process. Given Ativan for agitation with concern for ETOH withdrawal.      Goal of Therapy:  Heparin level 0.3-0.7 units/ml Monitor platelets by anticoagulation protocol: Yes   Plan:  Obtain baseline PT & INR, heparin level, and aPTT Will start IV heparin today at 1050 units/hr at 1700 when next dose of Xarelto would be due if patient it taking daily with evening meal 8 hr aPTT Monitor daily aPTT and heparin level until correlating, CBC, signs/symptoms of bleeding   Thank you for allowing pharmacy to be a part of this patient's care.  Royetta Asal, PharmD, BCPS Clinical Pharmacist [place] Please utilize Amion for appropriate phone number to reach the unit pharmacist ([place]) 08/25/2022 1:54 PM

## 2022-08-30 NOTE — Progress Notes (Signed)
Dr Jonnie Finner called this RN, requested that 3% NaCl be decreased to 67m/hr. Order modified.

## 2022-08-30 NOTE — H&P (Signed)
NAME:  Jewell Ryans, MRN:  [id number], DOB:  26-Mar-1951, LOS: 0 ADMISSION DATE:  08/23/2022, CONSULTATION DATE:  08/23/2022 REFERRING MD:  Dr. Francia Greaves, CHIEF COMPLAINT:  fall   History of Present Illness:  HPI obtained from EMR as patient is encephalopathic and no family able to be reached.   72 year old male with prior hx as below, significant for Afib on Xarelto, HFpEF, CKD IV, and DMT2 presented from home by EMS after unwitnessed fall.  Patient reported to EMS, fell out of bed due to weakness while trying to go the bathroom, denied LOC, and sustained a small laceration to the left temporal area.  Additionally complained of nausea and vomiting for one month in which he has not had evaluated.  Was alert and oriented initially and denied any neck or back pain.   In ER, patient found in afib with RVR, no temperature available yet, normoxia, and SBP 130-150's.  While in ER patient c/o of neck, back, and ankle pain then became more agitated, altered and tremulous with concern for ETOH withdrawal.  Given ativan '1mg'$  twice.  Additionally found to be in Afib with RVR, placed on cardizem gtt. Labs noted for Na 113, Cl 79, sCr 4.5, BUN/ sCr 64/ 5.11 (prior 54/ 3.26 04/2021), bicarb 15, AG 19, glucose 179, AST/ ALT 712/ 553, t. Bili 2.1, BNP 2970, trop hs 36, WBC 19.8, Hgb 10.3, Hct 28, plts 185, neg ETOH level, EKG afib with incomplete RBBB, rate 125, Qtc 579.  CTH and cervical neg for acute fx or intracranial process. CXR showed cardiomegaly with mild pulmonary venous congestion and possible early right perihilar opacity.  XR left/ right ankle neg, lumbar and pelvis neg for fx.  He has received 1L LR in ER.  PCCM called for admit.  Patient remains less talkative after ativan, remains tremulous, and confused.   Pertinent  Medical History  Former smoker, Afib on Xarelto, ETOH use, HTN, HFpEF, CKD IV, DMT2, anemia of chronic disease, colovesical fistula  Significant Hospital Events: Including procedures,  antibiotic start and stop dates in addition to other pertinent events   1/11 admitted   Interim History / Subjective:   Objective   Blood pressure (!) 139/90, pulse (!) 127, resp. rate (!) 22, height '5\' 9"'$  (1.753 m), weight 72.6 kg, SpO2 100 %.       No intake or output data in the 24 hours ending 09/13/2022 1057 Filed Weights   08/23/2022 0831  Weight: 72.6 kg    Examination: General:  ill appearing elderly male sitting upright on ER stretcher, restless HEENT: MM pink/dry, + JVD, pupils 3/reactive, lateral nystagmus  Neuro:  somnolent but arouses easily, slow to answer questions, confused, oriented to person, MAE CV: irir, no murmur PULM:  non labored, clear, no wheeze, RA > 98% GI: +bs, soft, NT/ ND Extremities: warm/dry, trace pedal edema  Skin: no rashes or lesions  Resolved Hospital Problem list    Assessment & Plan:   Acute metabolic/ toxic encephalopathy Concern for ETOH withdrawal - admit to ICU  - protecting airway at this time but high risk for intubation with encephalopathy - NPO - high dose thiamine  - precedex for ETOH w/d - check TSH, ammonia, UDS - infectious workup as below  - correct metabolic derangements as below  - Maintain neuro protective measures; goal for eurothermia, euglycemia, eunatermia, normoxia, and PCO2 goal of 35-40 - serial neuro exams  - Seizure /  Aspirations precautions   Transaminitis  - check INR, ammonia,  and tylenol level - LFTs in am, if rising, will need US/ further workup/ hepatitis panel, etc - check lipase  Hyponatremia AKI on CKD stage IV AGMA/ lactic acidosis - poor history, some reports of N/V x 1 month which would suggest hypovolemic hyponatremia but on exam appear hypervolemic with +JVD and dilated IVC.  Ddx include possible cardiorenal syndrome - s/p 1L NS in ER.  Will give lasix '120mg'$  x 1 - 2 amps bicarb now - check UA, urine osm, urine K and Na - check Na q 6hrs, avoid rise of 6-8 meq over next 24hrs  - renal  US - Trend BMP / urinary output - Replace electrolytes as indicated - Avoid nephrotoxic agents, ensure adequate renal perfusion - consider nephrology consult if worsening renal function  HTN Suspected HFpEF exacerbation, possible new systolic component  Afib with RVR - Prior TTE 02/2020> EF 55-60%, G3DD, normal RV, mild MR - assess echo - trend trop hs, flat thus far, EKG non acute - stop cardizem given suspicion of reduced EF based on bedside US - avoid amio w/ Qtc.  Consider IV low dose lopressor if needed for rate control - check TSH - goal Mag > 2, currently 0.7> aggressive Mag replete> 4gm and recheck, K> 4 - hold home xarelto.  Heparin per pharmacy  - hold holme clonidine, lasix, bidil, labetalol for now  Prolonged QTC - maximize electrolytes as above and avoid Qtc prolonging meds - tele  Suspected sepsis, possible aspiration PNA - remains normotensive, goal MAP> 65 - trend lactic  - send blood cultures - check UA - assess PCT - empiric ceftriaxone for now - monitor fever curve/ WBC   DM with hyperglycemia - SSI sensitive with CBG q4  Anemia of chronic disease - H/H at baseline around 10 - trend CBC/ transfuse for Hgb < 7 or active bleeding   ? Hx of month of N/V - pt unable to give history and family unable to be reached.  Will need to clarify hx with family/ pt when available.  Could be related to multiple factors, cont w/ workup as above  Left temporal scalp laceration s/p staple 2/2 fall  - staple removal in 7-10 days   Best Practice (right click and "Reselect all SmartList Selections" daily)   Diet/type: NPO DVT prophylaxis: systemic heparin GI prophylaxis: PPI Lines: N/A Foley:  N/A Code Status:  full code Last date of multidisciplinary goals of care discussion [pending]  Unable to reach family.  House number and both phone numbers of wife, [name], attempted.   Labs   CBC: Recent Labs  Lab 09/10/2022 0855  WBC 19.8*  NEUTROABS 17.0*  HGB 10.3*   HCT 28.3*  MCV 87.3  PLT 768    Basic Metabolic Panel: Recent Labs  Lab 09/10/2022 0855  NA 113*  K 4.5  CL 79*  CO2 15*  GLUCOSE 179*  BUN 64*  CREATININE 5.11*  CALCIUM 4.9*   GFR: Estimated Creatinine Clearance: 13.3 mL/min (A) (by C-G formula based on SCr of 5.11 mg/dL (H)). Recent Labs  Lab 09/04/2022 0855  WBC 19.8*    Liver Function Tests: Recent Labs  Lab 08/22/2022 0855  AST 712*  ALT 553*  ALKPHOS 75  BILITOT 2.1*  PROT 6.9  ALBUMIN 3.1*   No results for input(s): "LIPASE", "AMYLASE" in the last 168 hours. No results for input(s): "AMMONIA" in the last 168 hours.  ABG    Component Value Date/Time   HCO3 18.9 (L) 03/14/2020 0636  TCO2 23 06/26/2011 0946   ACIDBASEDEF 6.0 (H) 03/14/2020 0636   O2SAT 33.7 03/14/2020 0636     Coagulation Profile: No results for input(s): "INR", "PROTIME" in the last 168 hours.  Cardiac Enzymes: No results for input(s): "CKTOTAL", "CKMB", "CKMBINDEX", "TROPONINI" in the last 168 hours.  HbA1C: Hgb A1c MFr Bld  Date/Time Value Ref Range Status  05/16/2021 11:47 AM 6.8 (H) 4.6 - 6.5 % Final    Comment:    Glycemic Control Guidelines for People with Diabetes:Non Diabetic:  <6%Goal of Therapy: <7%Additional Action Suggested:  >8%   03/14/2020 10:16 AM 5.9 (H) 4.8 - 5.6 % Final    Comment:    (NOTE) Pre diabetes:          5.7%-6.4%  Diabetes:              >6.4%  Glycemic control for   <7.0% adults with diabetes     CBG: No results for input(s): "GLUCAP" in the last 168 hours.  Review of Systems:   Unable   Past Medical History:  He,  has a past medical history of Alcohol withdrawal seizure ([place]) (06/28/2011), Anxiety, Atrial fibrillation ([place]), Atypical mole (05/13/2008), Atypical mole (10/12/2020), Atypical mole (10/12/2020), Atypical nevi (05/13/2008), Atypical nevi (04/29/2015), Atypical nevi (04/29/2015), Atypical nevi (09/27/2015), Atypical nevi (09/27/2015), Atypical nevi (09/27/2015), Atypical nevi  (09/27/2015), Atypical nevi (03/23/2016), Atypical nevi (03/23/2016), Atypical nevi (06/14/2016), Atypical nevi (06/14/2016), Atypical nevi (08/30/2017), Atypical nevi (08/30/2017), Atypical nevi (07/25/2018), Atypical nevi (07/25/2018), Atypical nevi (08/25/2019), Atypical nevi (11/27/2019), Atypical nevi (11/27/2019), Atypical nevi (11/27/2019), Cancer ([place]) (2012), Chronic diastolic CHF (congestive heart failure) ([place]) (11/01/2014), Chronic kidney disease, Compression fracture of thoracic vertebra ([place]) (06/26/2011), Diabetes mellitus type 2 in nonobese ([place]), Fistula, bladder, Frequent urination at night, History of adenomatous polyp of colon (06/14/2014), History of nuclear stress test, Hypertension, PNA (pneumonia) (06/26/2011), Sepsis due to urinary tract infection ([place]) (04/16/2014), and Stroke ([place]) (sept 1, 2015).   Surgical History:   Past Surgical History:  Procedure Laterality Date   BIOPSY  03/16/2020   Procedure: BIOPSY;  Surgeon: Lavena Bullion, DO;  Location: [facility];  Service: Gastroenterology;;   COLON SURGERY     COLONOSCOPY N/A 06/14/2014   Procedure: COLONOSCOPY;  Surgeon: Gatha Mayer, MD;  Location: [facility];  Service: Endoscopy;  Laterality: N/A;   COLONOSCOPY WITH PROPOFOL N/A 03/16/2020   Procedure: COLONOSCOPY WITH PROPOFOL;  Surgeon: Lavena Bullion, DO;  Location: [facility];  Service: Gastroenterology;  Laterality: N/A;   CYSTOSCOPY WITH STENT PLACEMENT Bilateral 06/15/2014   Procedure: CYSTOSCOPY WITH BILATERAL STENT PLACEMENT;  Surgeon: Bernestine Amass, MD;  Location: [facility];  Service: Urology;  Laterality: Bilateral;   ESOPHAGOGASTRODUODENOSCOPY (EGD) WITH PROPOFOL N/A 03/16/2020   Procedure: ESOPHAGOGASTRODUODENOSCOPY (EGD) WITH PROPOFOL;  Surgeon: Lavena Bullion, DO;  Location: [facility];  Service: Gastroenterology;  Laterality: N/A;   LIPOMA EXCISION  2012   Dr Nonah Mattes   moles removed     Back   POLYPECTOMY  03/16/2020   Procedure:  POLYPECTOMY;  Surgeon: Lavena Bullion, DO;  Location: [facility];  Service: Gastroenterology;;   PROCTOSCOPY N/A 06/15/2014   Procedure: RIGID PROCTOSCOPY;  Surgeon: Michael Boston, MD;  Location: [facility];  Service: General;  Laterality: N/A;   RADIOLOGY WITH ANESTHESIA N/A 07/28/2014   Procedure: Embolization;  Surgeon: Luanne Bras, MD;  Location: [place];  Service: Radiology;  Laterality: N/A;   ROBOT ASSISTED LAPAROSCOPIC RADICAL PROSTATECTOMY  01/31/2011   Robotic-assisted laparoscopic radical retropubic  Social History:   reports that he quit smoking about 8 years ago. His smoking use included cigarettes. He has a 50.00 pack-year smoking history. He has never used smokeless tobacco. He reports current alcohol use of about 7.0 - 10.0 standard drinks of alcohol per week. He reports that he does not use drugs.   Family History:  His family history includes Atrial fibrillation in his father; Breast cancer in his mother; Colon polyps in his brother; Leukemia in his brother; Lung cancer in his mother; Prostate cancer in his father. There is no history of Colon cancer or Diabetes.   Allergies Allergies  Allergen Reactions   Hydrochlorothiazide Other (See Comments)    Pt gets hyponatremia   Lasix [Furosemide] Other (See Comments)    Sodium levels drop when take   Nsaids Other (See Comments)    Kidney disease/failure   Sulfa Antibiotics Other (See Comments)    headaches     Home Medications  Prior to Admission medications   Medication Sig Start Date End Date Taking? Authorizing Provider  cloNIDine (CATAPRES) 0.1 MG tablet TAKE 1 TABLET EVERY DAY 02/28/22   Vivi Barrack, MD  DM-Doxylamine-Acetaminophen 15-6.25-325 MG/15ML LIQD Take 30 mLs by mouth at bedtime as needed (cough/sleep).    [provider]  eszopiclone 3 MG TABS Take 1 tablet (3 mg total) by mouth at bedtime. Take immediately before bedtime 10/16/21   Vivi Barrack, MD  FEROSUL 325 (65 Fe) MG tablet TAKE 1  TABLET EVERY DAY (SUBSTITUTED FOR FEROSUL) 04/30/22   Vivi Barrack, MD  folic acid (FOLVITE) 1 MG tablet TAKE 1 TABLET EVERY DAY 02/28/22   Vivi Barrack, MD  furosemide (LASIX) 40 MG tablet Take 1 tablet (40 mg total) by mouth daily. 08/26/20   [place], Peter M, MD  hydrOXYzine (ATARAX) 50 MG tablet TAKE 1 TABLET THREE TIMES DAILY AS NEEDED FOR ANXIETY AND INSOMNIA 07/06/22   Vivi Barrack, MD  isosorbide-hydrALAZINE (BIDIL) 20-37.5 MG tablet Take 2 tablets by mouth 2 (two) times daily.     [provider]  JANUVIA 100 MG tablet TAKE 1 TABLET EVERY DAY 07/27/22   Vivi Barrack, MD  labetalol (NORMODYNE) 200 MG tablet Take 1 tablet (200 mg total) by mouth 2 (two) times daily. 04/06/20   Almyra Deforest, PA  Multiple Vitamin (MULTIVITAMIN WITH MINERALS) TABS tablet Take 1 tablet by mouth daily. 03/18/20   Hosie Poisson, MD  pantoprazole (PROTONIX) 40 MG tablet TAKE 1 TABLET TWICE DAILY 05/17/22   Vivi Barrack, MD  Rivaroxaban (XARELTO) 15 MG TABS tablet Take 1 tablet (15 mg total) by mouth daily with supper. 02/01/22   [place], Peter M, MD  thiamine 100 MG tablet Take 1 tablet (100 mg total) by mouth daily. 03/18/20   Hosie Poisson, MD  triamcinolone cream (KENALOG) 0.1 % Apply topically 2 (two) times daily. 11/12/19   [place], Ronalee Red, PA-C  zolpidem (AMBIEN) 10 MG tablet TAKE 1 TABLET (10 MG TOTAL) BY MOUTH AT BEDTIME AS NEEDED FOR SLEEP. 06/21/22 07/21/22  Vivi Barrack, MD     Critical care time: [address] mins     Amanda Cockayne [facility] 09/16/2022, 10:57 AM  See Amion for pager If no response to pager, please call PCCM consult pager After 7:00 pm call Elink

## 2022-08-30 NOTE — Progress Notes (Signed)
Notified CCM NP Brooke in regards to bladder scan volume range of 0-64m at 1600.

## 2022-08-31 DIAGNOSIS — E871 Hypo-osmolality and hyponatremia: Secondary | ICD-10-CM | POA: Diagnosis not present

## 2022-08-31 LAB — BASIC METABOLIC PANEL
Anion gap: 20 — ABNORMAL HIGH (ref 5–15)
Anion gap: 24 — ABNORMAL HIGH (ref 5–15)
Anion gap: 21 — ABNORMAL HIGH (ref 5–15)
BUN: 71 mg/dL — ABNORMAL HIGH (ref 8–23)
BUN: 78 mg/dL — ABNORMAL HIGH (ref 8–23)
BUN: 79 mg/dL — ABNORMAL HIGH (ref 8–23)
CO2: 14 mmol/L — ABNORMAL LOW (ref 22–32)
CO2: 10 mmol/L — ABNORMAL LOW (ref 22–32)
CO2: 13 mmol/L — ABNORMAL LOW (ref 22–32)
Calcium: 4.6 mg/dL — CL (ref 8.9–10.3)
Calcium: 5.2 mg/dL — CL (ref 8.9–10.3)
Calcium: 6.2 mg/dL — CL (ref 8.9–10.3)
Chloride: 85 mmol/L — ABNORMAL LOW (ref 98–111)
Chloride: 91 mmol/L — ABNORMAL LOW (ref 98–111)
Chloride: 89 mmol/L — ABNORMAL LOW (ref 98–111)
Creatinine, Ser: 5.43 mg/dL — ABNORMAL HIGH (ref 0.61–1.24)
Creatinine, Ser: 5.52 mg/dL — ABNORMAL HIGH (ref 0.61–1.24)
Creatinine, Ser: 5.62 mg/dL — ABNORMAL HIGH (ref 0.61–1.24)
GFR, Estimated: 11 mL/min — ABNORMAL LOW (ref >60)
GFR, Estimated: 10 mL/min — ABNORMAL LOW (ref >60)
GFR, Estimated: 10 mL/min — ABNORMAL LOW (ref >60)
Glucose, Bld: 72 mg/dL (ref 70–99)
Glucose, Bld: 84 mg/dL (ref 70–99)
Glucose, Bld: 176 mg/dL — ABNORMAL HIGH (ref 70–99)
Potassium: 3.8 mmol/L (ref 3.5–5.1)
Potassium: 3.8 mmol/L (ref 3.5–5.1)
Potassium: 4.1 mmol/L (ref 3.5–5.1)
Sodium: 119 mmol/L — CL (ref 135–145)
Sodium: 125 mmol/L — ABNORMAL LOW (ref 135–145)
Sodium: 123 mmol/L — ABNORMAL LOW (ref 135–145)

## 2022-08-31 LAB — BLOOD CULTURE ID PANEL (REFLEXED) - BCID2

## 2022-08-31 LAB — HEPATIC FUNCTION PANEL
ALT: 856 U/L — ABNORMAL HIGH (ref 0–44)
AST: 1370 U/L — ABNORMAL HIGH (ref 15–41)
Albumin: 2.6 g/dL — ABNORMAL LOW (ref 3.5–5.0)
Alkaline Phosphatase: 69 U/L (ref 38–126)
Bilirubin, Direct: 0.7 mg/dL — ABNORMAL HIGH (ref 0.0–0.2)
Indirect Bilirubin: 0.7 mg/dL (ref 0.3–0.9)
Total Bilirubin: 1.4 mg/dL — ABNORMAL HIGH (ref 0.3–1.2)
Total Protein: 5.8 g/dL — ABNORMAL LOW (ref 6.5–8.1)

## 2022-08-31 LAB — MAGNESIUM: Magnesium: 2.1 mg/dL (ref 1.7–2.4)

## 2022-08-31 LAB — GLUCOSE, CAPILLARY
Glucose-Capillary: 78 mg/dL (ref 70–99)
Glucose-Capillary: 81 mg/dL (ref 70–99)
Glucose-Capillary: 83 mg/dL (ref 70–99)
Glucose-Capillary: 98 mg/dL (ref 70–99)
Glucose-Capillary: 170 mg/dL — ABNORMAL HIGH (ref 70–99)

## 2022-08-31 LAB — CBC
HCT: 24.2 % — ABNORMAL LOW (ref 39.0–52.0)
Hemoglobin: 8.7 g/dL — ABNORMAL LOW (ref 13.0–17.0)
MCH: 32 pg (ref 26.0–34.0)
MCHC: 36 g/dL (ref 30.0–36.0)
MCV: 89 fL (ref 80.0–100.0)
Platelets: 120 10*3/uL — ABNORMAL LOW (ref 150–400)
RBC: 2.72 MIL/uL — ABNORMAL LOW (ref 4.22–5.81)
RDW: 11.6 % (ref 11.5–15.5)
WBC: 15.4 10*3/uL — ABNORMAL HIGH (ref 4.0–10.5)
nRBC: 0 % (ref 0.0–0.2)

## 2022-08-31 LAB — HEPARIN LEVEL (UNFRACTIONATED)
Heparin Unfractionated: <0.1 IU/mL — ABNORMAL LOW (ref 0.30–0.70)
Heparin Unfractionated: <0.1 IU/mL — ABNORMAL LOW (ref 0.30–0.70)

## 2022-08-31 LAB — PROTIME-INR
INR: 2.5 — ABNORMAL HIGH (ref 0.8–1.2)
Prothrombin Time: 27 seconds — ABNORMAL HIGH (ref 11.4–15.2)

## 2022-08-31 LAB — HEPATITIS PANEL, ACUTE
HCV Ab: NONREACTIVE
Hep A IgM: NONREACTIVE
Hep B C IgM: NONREACTIVE
Hepatitis B Surface Ag: NONREACTIVE

## 2022-08-31 LAB — OSMOLALITY: Osmolality: 262 mOsm/kg — ABNORMAL LOW (ref 275–295)

## 2022-08-31 LAB — TSH: TSH: 0.846 u[IU]/mL (ref 0.350–4.500)

## 2022-08-31 LAB — OSMOLALITY, URINE: Osmolality, Ur: 272 mOsm/kg — ABNORMAL LOW (ref 300–900)

## 2022-08-31 LAB — CALCIUM, IONIZED: Calcium, Ionized, Serum: <3 mg/dL — ABNORMAL LOW (ref 4.5–5.6)

## 2022-08-31 LAB — SODIUM
Sodium: 123 mmol/L — ABNORMAL LOW (ref 135–145)
Sodium: 126 mmol/L — ABNORMAL LOW (ref 135–145)

## 2022-08-31 LAB — CORTISOL-AM, BLOOD: Cortisol - AM: 30.8 ug/dL — ABNORMAL HIGH (ref 6.7–22.6)

## 2022-08-31 LAB — APTT: aPTT: 35 seconds (ref 24–36)

## 2022-08-31 MED ORDER — IPRATROPIUM-ALBUTEROL 0.5-2.5 (3) MG/3ML IN SOLN
3.0000 mL | Freq: Three times a day (TID) | RESPIRATORY_TRACT | Status: DC
  Administered 2022-08-31: 3 mL via RESPIRATORY_TRACT
  Filled 2022-08-31 (×2): qty 3

## 2022-08-31 MED ORDER — CALCIUM GLUCONATE-NACL 2-0.675 GM/100ML-% IV SOLN
2.0000 g | Freq: Once | INTRAVENOUS | Status: AC
  Administered 2022-08-31: 2000 mg via INTRAVENOUS
  Filled 2022-08-31: qty 100

## 2022-08-31 MED ORDER — HEPARIN (PORCINE) 25000 UT/250ML-% IV SOLN
1500.0000 [IU]/h | INTRAVENOUS | Status: DC
  Administered 2022-09-01: 1500 [IU]/h via INTRAVENOUS
  Filled 2022-08-31: qty 250

## 2022-08-31 MED ORDER — HEPARIN BOLUS VIA INFUSION
2000.0000 [IU] | Freq: Once | INTRAVENOUS | Status: AC
  Administered 2022-08-31: 2000 [IU] via INTRAVENOUS
  Filled 2022-08-31: qty 2000

## 2022-08-31 MED ORDER — AMOXICILLIN-POT CLAVULANATE 500-125 MG PO TABS
1.0000 | ORAL_TABLET | Freq: Two times a day (BID) | ORAL | Status: DC
  Administered 2022-08-31 – 2022-09-02 (×5): 1 via ORAL
  Filled 2022-08-31 (×6): qty 1

## 2022-08-31 MED ORDER — CHLORHEXIDINE GLUCONATE CLOTH 2 % EX PADS
6.0000 | MEDICATED_PAD | Freq: Every day | CUTANEOUS | Status: DC
  Administered 2022-08-31 – 2022-09-05 (×5): 6 via TOPICAL

## 2022-08-31 MED ORDER — IPRATROPIUM-ALBUTEROL 0.5-2.5 (3) MG/3ML IN SOLN
3.0000 mL | Freq: Three times a day (TID) | RESPIRATORY_TRACT | Status: DC
  Administered 2022-08-31 – 2022-09-09 (×23): 3 mL via RESPIRATORY_TRACT
  Filled 2022-08-31 (×24): qty 3

## 2022-08-31 MED ORDER — CALCIUM CARBONATE ANTACID 1250 MG/5ML PO SUSP: 500.0000 mg | Freq: Three times a day (TID) | ORAL | Status: DC

## 2022-08-31 MED ORDER — HEPARIN (PORCINE) 25000 UT/250ML-% IV SOLN
1050.0000 [IU]/h | INTRAVENOUS | Status: DC
  Administered 2022-08-31: 1050 [IU]/h via INTRAVENOUS
  Filled 2022-08-31: qty 250

## 2022-08-31 MED ORDER — VITAMIN K1 10 MG/ML IJ SOLN
10.0000 mg | Freq: Once | INTRAVENOUS | Status: DC
  Filled 2022-08-31: qty 1

## 2022-08-31 MED ORDER — SODIUM CHLORIDE 0.9 % IV SOLN
1.0000 mg/kg/h | INTRAVENOUS | Status: DC
  Administered 2022-08-31 – 2022-09-01 (×2): 1 mg/kg/h via INTRAVENOUS
  Filled 2022-08-31 (×2): qty 100

## 2022-08-31 MED ORDER — STERILE WATER FOR INJECTION IV SOLN
INTRAVENOUS | Status: DC
  Filled 2022-08-31: qty 150

## 2022-08-31 MED ORDER — FUROSEMIDE 10 MG/ML IJ SOLN
60.0000 mg | Freq: Two times a day (BID) | INTRAMUSCULAR | Status: DC
  Administered 2022-08-31: 60 mg via INTRAVENOUS
  Filled 2022-08-31: qty 6

## 2022-08-31 MED ORDER — LABETALOL HCL 5 MG/ML IV SOLN
10.0000 mg | INTRAVENOUS | Status: DC | PRN
  Administered 2022-08-31 – 2022-09-02 (×4): 10 mg via INTRAVENOUS
  Filled 2022-08-31 (×4): qty 4

## 2022-08-31 MED ORDER — LORAZEPAM 2 MG/ML IJ SOLN
1.0000 mg | INTRAMUSCULAR | Status: DC | PRN
  Administered 2022-08-31: 2 mg via INTRAVENOUS
  Filled 2022-08-31: qty 1

## 2022-08-31 NOTE — Progress Notes (Addendum)
[facility] Progress Note  Subjective: serum Na+ up to 119 --> 123 this am.   Vitals:   08/31/22 0400 08/31/22 0500 08/31/22 0600 08/31/22 0700  BP: 128/67 118/71 123/83 133/87  Pulse: 97 100 (!) 102 98  Resp: '19 18 17 18  '$ Temp: 98.6 F (37 C)     TempSrc: Axillary     SpO2: 96% 95% 97% 97%  Weight:      Height:        Exam: en awake but confused, follows simple commands Chest R clear, rales L base RRR no MRG Abd soft ntnd no mass or ascites +bs GU normal male Ext no LE edema Neuro as above, moves all ext, lethargic and confused      Home meds - clonidine 0.1 hs, lasix 40 qd, imdur 60, januvia, labetalol 200 bid, protonix, xarelto, ambien, eszopiclone, hydralazine 50 mg, MVI, vits/ prns/ supps    Date   Creat  eGFR   2011- 2012  0.67- 1.00   2015- 2017  1.00- 2.09   2018   2.29  31 ml/min   2019    2.40- 2.60   2020   2.15- 3.47   2021   2.82- 4.80   Mar- sept 2022 3.26- 3.70 18 ml/min , CKD IV   April 2023  3.71  17   Sept 2023  3.46  18   08/15/22  4.00  15 ml/min   09/14/2022  5.11  11   08/31/22   5.43  11 ml/min, CKD V       UA - 100 prot, 0-5 wbc/ rbc    UNa  <10      UOsm 272    UCr 82    ECHO - new low EF 25-30%   Assessment/ Plan: Hyponatremia - euvolemic vs possibly hypervolemic. Could have beer potomania (low solute). UNa prerenal (decomp CHF vs vol depletion), UOsm mid-range. We started 3% saline on 1/11. TSH and cortisol wnl. New low EF% by echo and started IV bumex (lasix allergy) for possible CHF/ edema (per CXR). Na+ this am is up to 119 then 123. I lowered 3% to 15 ml/hr this am and 3% is now turned off w/ Na+ 125. Will follow.  AKI on CKD4 - b/l creatinine 3.7- 4.0 from sept - dec 2023, eGFR 15- 26m/min. Creat here 5.1 on admission in setting of AMS, hyponatremia, etoh w/d, AMS. Could have been uremic w/ eGFR 11. Correct Na+ then reassess for possible uremia/ need for dialysis. Will try gentle IVF's w/ sod bicarb, see if this helps.   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 Getting IV Ca++ per CCM, will add on po CaCO3 tid.  Vol - euvolemic on exam, vasc congestion w/ R perihilar edema on CXR. Did not respond to IV bumex, will hold IV diuretics for now, not sure he is vol overloaded.  HTN - on 3-4 BP lowering meds at home. BP's here are stable, low normal.  Volume -  no edema or ascites by exam, CXR as above Etoh abuse - per pmd AMS - multifactorial due to low Na, etoh w/d, other Atrial fib - sp diltiazem HFrEF - echo showing LVEF 25-30% H/o CVA       Rob Siniyah Evangelist 08/31/2022, 8:42 AM   Recent Labs  Lab 09/03/2022 0855 08/31/22 0330  HGB  10.3* 8.7*  ALBUMIN 3.1* 2.6*  CALCIUM 4.9* 4.6*  PHOS 5.3*  --   CREATININE 5.11* 5.43*  K 4.5 3.8   No results for input(s): "IRON", "TIBC", "FERRITIN" in the last 168 hours. Inpatient medications:  bumetanide (BUMEX) IV  2 mg Intravenous Q12H   Chlorhexidine Gluconate Cloth  6 each Topical Daily   insulin aspart  0-9 Units Subcutaneous Q4H    cefTRIAXone (ROCEPHIN)  IV Stopped (09/04/2022 1328)   dexmedetomidine (PRECEDEX) IV infusion 0.4 mcg/kg/hr (08/31/22 0800)   sodium chloride (hypertonic) 32 mL/hr at 08/31/22 0800   thiamine (VITAMIN B1) injection Stopped (09/08/2022 2334)   Followed by   Derrill Memo ON 09/01/2022] thiamine (VITAMIN B1) injection     docusate sodium, polyethylene glycol

## 2022-08-31 NOTE — Progress Notes (Addendum)
ANTICOAGULATION CONSULT NOTE - Follow Up Consult  Pharmacy Consult for heparin Indication: atrial fibrillation (PTA xarelto on hold)  Allergies  Allergen Reactions   Hydrochlorothiazide Other (See Comments)    Pt gets hyponatremia   Lasix [Furosemide] Other (See Comments)    Sodium levels drop when take   Nsaids Other (See Comments)    Kidney disease/failure   Sulfa Antibiotics Other (See Comments)    headaches    Patient Measurements: Height: '5\' 9"'$  (175.3 cm) Weight: 72.6 kg (160 lb) IBW/kg (Calculated) : 70.7 Heparin Dosing Weight: 73 kg  Vital Signs: Temp: 98.3 F (36.8 C) (01/12 0700) Temp Source: Oral (01/12 0700) BP: 133/87 (01/12 0700) Pulse Rate: 98 (01/12 0700)  Labs: Recent Labs    09/17/2022 0855 09/12/2022 1130 08/27/2022 1336 08/31/22 0330  HGB 10.3*  --   --  8.7*  HCT 28.3*  --   --  24.2*  PLT 185  --   --  120*  APTT  --   --  47*  --   LABPROT  --   --  40.4* 27.0*  INR  --   --  4.2* 2.5*  HEPARINUNFRC  --   --  >1.10*  --   CREATININE 5.11*  --   --  5.43*  TROPONINIHS 36* 37*  --   --     Estimated Creatinine Clearance: 12.5 mL/min (A) (by C-G formula based on SCr of 5.43 mg/dL (H)).   Medications:  - on xarelto 15 mg daily PTA (last dose taken on 08/28/22 PM)  Assessment: Patient is a 72 y.o M with hx EtOH and  afib on xarelto PTA who presented to the ED on 09/14/2022 for evaluation after he fell out of bed.  Head CT showed no acute intracranial findings but noted laceration in the left frontotemporal region. Heparin drip ordered on 1/11 but then d/ced on the same day.  INR was noted to be 4.2 on 1/11. INR is likely falsely elevated d/t effect of xarelto and decreased clearance of drug d/t renal insufficiency.  Pharmacy has been consulted on 1/12 to start heparin drip for afib.  Today, 08/31/2022: - INR down 2.5 ( elevated INR is likely from residual effect of xarelto, elevated LFTs may also contribute to this) - hgb 8.7, plts down 120K  - AST/ALT  up 1370/856; Tbili slightly elevated - scr up 5.43 (crcl~12) - in afib  Goal of Therapy:  Heparin level 0.3-0.7 units/ml Monitor platelets by anticoagulation protocol: Yes   Plan:  - baseline Heparin level and aPTT now - heparin drip at 1050 units/hr - check 8 hr heparin level and aPTT - monitor for s/sx bleeding  Season Astacio P 08/31/2022,9:58 AM  _________________________________  Adden: baseline heparin level is <0.10 and aPTT 35 secs. Will monitor and dose heparin drip based on heparin level  Dia Sitter, PharmD, BCPS 08/31/2022 10:44 AM

## 2022-08-31 NOTE — Progress Notes (Addendum)
[facility] Progress Note Patient Name: Douglas Ballard DOB: 06-04-1951 MRN: [id number]   Date of Service  08/31/2022  HPI/Events of Note  Multiple issues: 1. Hypokalemia - Ca++ = 4.6 which corrects to 5.72 (Low) given Albumin = 2.6. 2. Hyponatremia - Na+ = 119 --> 115 --> 118 --> 119.  eICU Interventions  Plan: Will replace Ca++. Continue 3% NaCl at current rate of 32 mL/hour.      Intervention Category Major Interventions: Electrolyte abnormality - evaluation and management  Lysle Dingwall 08/31/2022, 5:37 AM

## 2022-08-31 NOTE — Progress Notes (Signed)
Spoke with Dr. Hollie Salk on the phone regarding critical sodium 118, order to continue 3% at 35m/hr.

## 2022-08-31 NOTE — Progress Notes (Signed)
Pharmacy Consult Note - IV heparin follow up  Labs: heparin level <0.1  A/P: heparin level SUBtherapeutic (goal 0.3-0.7 for Afib) on current IV heparin rate of 1050 units/hr. Per RN, no bleeding or issues. Give 2000 unit bolus of IV heparin then increase rate to 1250 units/hr. Recheck heparin level 6 hours after bolus and rate increase.  Adrian Saran, PharmD, BCPS Secure Chat if ?s 08/31/2022 7:09 PM

## 2022-08-31 NOTE — Progress Notes (Signed)
NAME:  Douglas Ballard, MRN:  [id number], DOB:  December 06, 1950, LOS: 1 ADMISSION DATE:  09/19/2022, CONSULTATION DATE:  08/29/2022 REFERRING MD:  Dr. Francia Greaves, CHIEF COMPLAINT:  fall   History of Present Illness:  HPI obtained from EMR as patient is encephalopathic and no family able to be reached.   72 year old male with prior hx as below, significant for Afib on Xarelto, HFpEF, CKD IV, and DMT2 presented from home by EMS after unwitnessed fall.  Patient reported to EMS, fell out of bed due to weakness while trying to go the bathroom, denied LOC, and sustained a small laceration to the left temporal area.  Additionally complained of nausea and vomiting for one month in which he has not had evaluated.  Was alert and oriented initially and denied any neck or back pain.   In ER, patient found in afib with RVR, no temperature available yet, normoxia, and SBP 130-150's.  While in ER patient c/o of neck, back, and ankle pain then became more agitated, altered and tremulous with concern for ETOH withdrawal.  Given ativan '1mg'$  twice.  Additionally found to be in Afib with RVR, placed on cardizem gtt. Labs noted for Na 113, Cl 79, sCr 4.5, BUN/ sCr 64/ 5.11 (prior 54/ 3.26 04/2021), bicarb 15, AG 19, glucose 179, AST/ ALT 712/ 553, t. Bili 2.1, BNP 2970, trop hs 36, WBC 19.8, Hgb 10.3, Hct 28, plts 185, neg ETOH level, EKG afib with incomplete RBBB, rate 125, Qtc 579.  CTH and cervical neg for acute fx or intracranial process. CXR showed cardiomegaly with mild pulmonary venous congestion and possible early right perihilar opacity.  XR left/ right ankle neg, lumbar and pelvis neg for fx.  He has received 1L LR in ER.  PCCM called for admit.  Patient remains less talkative after ativan, remains tremulous, and confused.   Pertinent  Medical History  Former smoker, Afib on Xarelto, ETOH use, HTN, HFpEF, CKD IV, DMT2, anemia of chronic disease, colovesical fistula  Significant Hospital Events: Including procedures,  antibiotic start and stop dates in addition to other pertinent events   1/11 admitted   Interim History / Subjective:   He is complaining of dry mouth this morning No other complaints at this time.  eLink repleted electrolytes overnight He is on hypertonic saline  UOP 361m in past 24 hours  Objective   Blood pressure 133/87, pulse 98, temperature 98.6 F (37 C), temperature source Axillary, resp. rate 18, height '5\' 9"'$  (1.753 m), weight 72.6 kg, SpO2 97 %.        Intake/Output Summary (Last 24 hours) at 08/31/2022 0726 Last data filed at 08/31/2022 [id number] per 24 hour  Intake 924.27 ml  Output 300 ml  Net 624.27 ml   Filed Weights   09/15/2022 0831  Weight: 72.6 kg   Examination: General:  ill appearing male, laying in bed, no acute distress HEENT: MM pink/dry, no JVD, AT/[place], PERRL, no scleral icterus Neuro:  Alert and oriented x 3, moving all extremities CV: irregularly irregular, no murmur PULM:  mild end expiratory wheezes - scattered. GI: +bs, soft, NT/ ND Extremities: warm/dry, trace pedal edema  Skin: no rashes or lesions  RUQ [place]01/02/2023 Unremarkable  Echo 09/01/2022 EF 20 to 25%, global hypokinesis RV systolic function mildly reduced, size moderately enlarged. Mildly elevated PA pressure. LA and RA severely dilated.  Renal [place]1/11/24 Within normal limits  Resolved Hospital Problem list   Hypomagnesemia  Assessment & Plan:   Acute metabolic/  toxic encephalopathy Concern for ETOH withdrawal - high dose thiamine  - precedex for ETOH w/d, will try to wean off today - CIWA protocol with PRN ativan - infectious workup as below  - correct metabolic derangements as below  - Seizure /  Aspirations precautions   Elevated LFTs Elevated T. Bilirubin Elevated INR In setting of heart failure with hepatic congestion - RUQ [place] normal - tylenol level not elevated - Check hepatitis panel - monitor INR, improved today  Hyponatremia Hypocalcemia AKI on CKD  stage IV AGMA/ lactic acidosis Hypovolemic, hypo-osmolar - continue 3% saline infusion per nephrology - renal US is normal - Replace electrolytes as indicated - Avoid nephrotoxic agents, ensure adequate renal perfusion  Acute Systolic Heart Failure Afib with RVR HTN - Prior TTE 02/2020> EF 55-60%, G3DD, normal RV, mild MR - New Echo with EF 20-25% - Troponins not significant elevated and no ischemic EKG changes - HR is better controlled at this time off rate control agents - Continue aggressive electrolyte replacement - TSH within normal limits - goal Mag > 2, K> 4 - Heparin per pharmacy now that INR improved - hold holme clonidine, lasix, bidil, labetalol for now  Prolonged QTC - maximize electrolytes as above and avoid Qtc prolonging meds - tele  Suspected sepsis, possible aspiration PNA - remains normotensive, goal MAP> 65 - f/u blood cultures - empiric ceftriaxone for now - monitor fever curve/ WBC   DM with hyperglycemia - SSI sensitive with CBG q4  Anemia of chronic disease - H/H at baseline around 10 - trend CBC/ transfuse for Hgb < 7 or active bleeding   Left temporal scalp laceration s/p staple 2/2 fall  - staple removal in 7-10 days   Best Practice (right click and "Reselect all SmartList Selections" daily)   Diet/type: NPO DVT prophylaxis: systemic heparin GI prophylaxis: PPI Lines: N/A Foley:  N/A Code Status:  full code Last date of multidisciplinary goals of care discussion [pending]  Labs   CBC: Recent Labs  Lab 09/10/2022 0855 08/31/22 0330  WBC 19.8* 15.4*  NEUTROABS 17.0*  --   HGB 10.3* 8.7*  HCT 28.3* 24.2*  MCV 87.3 89.0  PLT 185 120*    Basic Metabolic Panel: Recent Labs  Lab 08/27/2022 0855 09/02/2022 1222 08/20/2022 1648 09/14/2022 1754 09/17/2022 2258 08/31/22 0330  NA 113* 114* 119* 115* 118* 119*  K 4.5  --   --   --   --  3.8  CL 79*  --   --   --   --  85*  CO2 15*  --   --   --   --  14*  GLUCOSE 179*  --   --   --   --   72  BUN 64*  --   --   --   --  71*  CREATININE 5.11*  --   --   --   --  5.43*  CALCIUM 4.9*  --   --   --   --  4.6*  MG 0.7*  --   --  1.4*  --  2.1  PHOS 5.3*  --   --   --   --   --    GFR: Estimated Creatinine Clearance: 12.5 mL/min (A) (by C-G formula based on SCr of 5.43 mg/dL (H)). Recent Labs  Lab 08/23/2022 0855 08/31/2022 1135 08/28/2022 1222 08/23/2022 1648 08/31/22 0330  PROCALCITON  --   --  0.39  --   --   WBC  19.8*  --   --   --  15.4*  LATICACIDVEN  --  2.2*  --  1.7  --     Liver Function Tests: Recent Labs  Lab 08/31/2022 0855 08/31/22 0330  AST 712* 1,370*  ALT 553* 856*  ALKPHOS 75 69  BILITOT 2.1* 1.4*  PROT 6.9 5.8*  ALBUMIN 3.1* 2.6*   Recent Labs  Lab 09/10/2022 1222  LIPASE 49   Recent Labs  Lab 08/26/2022 1131  AMMONIA 26    ABG    Component Value Date/Time   HCO3 18.9 (L) 03/14/2020 0636   TCO2 23 06/26/2011 0946   ACIDBASEDEF 6.0 (H) 03/14/2020 0636   O2SAT 33.7 03/14/2020 0636     Coagulation Profile: Recent Labs  Lab 09/01/2022 1336 08/31/22 0330  INR 4.2* 2.5*    Cardiac Enzymes: No results for input(s): "CKTOTAL", "CKMB", "CKMBINDEX", "TROPONINI" in the last 168 hours.  HbA1C: Hgb A1c MFr Bld  Date/Time Value Ref Range Status  05/16/2021 11:47 AM 6.8 (H) 4.6 - 6.5 % Final    Comment:    Glycemic Control Guidelines for People with Diabetes:Non Diabetic:  <6%Goal of Therapy: <7%Additional Action Suggested:  >8%   03/14/2020 10:16 AM 5.9 (H) 4.8 - 5.6 % Final    Comment:    (NOTE) Pre diabetes:          5.7%-6.4%  Diabetes:              >6.4%  Glycemic control for   <7.0% adults with diabetes     CBG: Recent Labs  Lab 09/18/2022 1255 08/24/2022 1706 09/17/2022 1920 09/03/2022 2349 08/31/22 0338  GLUCAP 184* 130* 107* 75 78    Critical care time: 40 mins     Freda Jackson, MD [facility] Office: [phone]   See Amion for personal pager PCCM on call pager [phone] until  7pm. Please call Elink 7p-7a. [phone]

## 2022-09-01 DIAGNOSIS — E871 Hypo-osmolality and hyponatremia: Secondary | ICD-10-CM | POA: Diagnosis not present

## 2022-09-01 LAB — GLUCOSE, CAPILLARY
Glucose-Capillary: 132 mg/dL — ABNORMAL HIGH (ref 70–99)
Glucose-Capillary: 138 mg/dL — ABNORMAL HIGH (ref 70–99)
Glucose-Capillary: 139 mg/dL — ABNORMAL HIGH (ref 70–99)
Glucose-Capillary: 139 mg/dL — ABNORMAL HIGH (ref 70–99)
Glucose-Capillary: 144 mg/dL — ABNORMAL HIGH (ref 70–99)
Glucose-Capillary: 156 mg/dL — ABNORMAL HIGH (ref 70–99)
Glucose-Capillary: 161 mg/dL — ABNORMAL HIGH (ref 70–99)

## 2022-09-01 LAB — SODIUM
Sodium: 125 mmol/L — ABNORMAL LOW (ref 135–145)
Sodium: 126 mmol/L — ABNORMAL LOW (ref 135–145)

## 2022-09-01 LAB — BASIC METABOLIC PANEL
Anion gap: 17 — ABNORMAL HIGH (ref 5–15)
Anion gap: >20 — ABNORMAL HIGH (ref 5–15)
BUN: 75 mg/dL — ABNORMAL HIGH (ref 8–23)
BUN: 78 mg/dL — ABNORMAL HIGH (ref 8–23)
CO2: 22 mmol/L (ref 22–32)
CO2: 15 mmol/L — ABNORMAL LOW (ref 22–32)
Calcium: 6.6 mg/dL — ABNORMAL LOW (ref 8.9–10.3)
Calcium: 8 mg/dL — ABNORMAL LOW (ref 8.9–10.3)
Chloride: 83 mmol/L — ABNORMAL LOW (ref 98–111)
Chloride: 85 mmol/L — ABNORMAL LOW (ref 98–111)
Creatinine, Ser: 4.92 mg/dL — ABNORMAL HIGH (ref 0.61–1.24)
Creatinine, Ser: 5.57 mg/dL — ABNORMAL HIGH (ref 0.61–1.24)
GFR, Estimated: 12 mL/min — ABNORMAL LOW (ref >60)
GFR, Estimated: 10 mL/min — ABNORMAL LOW (ref >60)
Glucose, Bld: 120 mg/dL — ABNORMAL HIGH (ref 70–99)
Glucose, Bld: 150 mg/dL — ABNORMAL HIGH (ref 70–99)
Potassium: 3.4 mmol/L — ABNORMAL LOW (ref 3.5–5.1)
Potassium: 4.6 mmol/L (ref 3.5–5.1)
Sodium: 122 mmol/L — ABNORMAL LOW (ref 135–145)
Sodium: 121 mmol/L — ABNORMAL LOW (ref 135–145)

## 2022-09-01 LAB — CBC
HCT: 23.3 % — ABNORMAL LOW (ref 39.0–52.0)
Hemoglobin: 8.5 g/dL — ABNORMAL LOW (ref 13.0–17.0)
MCH: 31.4 pg (ref 26.0–34.0)
MCHC: 36.5 g/dL — ABNORMAL HIGH (ref 30.0–36.0)
MCV: 86 fL (ref 80.0–100.0)
Platelets: 108 10*3/uL — ABNORMAL LOW (ref 150–400)
RBC: 2.71 MIL/uL — ABNORMAL LOW (ref 4.22–5.81)
RDW: 11.5 % (ref 11.5–15.5)
WBC: 12.2 10*3/uL — ABNORMAL HIGH (ref 4.0–10.5)
nRBC: 0 % (ref 0.0–0.2)

## 2022-09-01 LAB — HEPARIN LEVEL (UNFRACTIONATED)
Heparin Unfractionated: <0.1 IU/mL — ABNORMAL LOW (ref 0.30–0.70)
Heparin Unfractionated: 0.2 IU/mL — ABNORMAL LOW (ref 0.30–0.70)

## 2022-09-01 MED ORDER — SODIUM BICARBONATE 650 MG PO TABS: 650.0000 mg | ORAL_TABLET | Freq: Three times a day (TID) | ORAL | Status: DC

## 2022-09-01 MED ORDER — SODIUM CHLORIDE 1 G PO TABS
1.0000 g | ORAL_TABLET | Freq: Once | ORAL | Status: AC
  Administered 2022-09-01: 1 g via ORAL
  Filled 2022-09-01: qty 1

## 2022-09-01 MED ORDER — HEPARIN BOLUS VIA INFUSION
2000.0000 [IU] | Freq: Once | INTRAVENOUS | Status: AC
  Administered 2022-09-01: 2000 [IU] via INTRAVENOUS
  Filled 2022-09-01: qty 2000

## 2022-09-01 MED ORDER — SODIUM CHLORIDE 1 G PO TABS
2.0000 g | ORAL_TABLET | Freq: Three times a day (TID) | ORAL | Status: DC
  Administered 2022-09-01 – 2022-09-02 (×5): 2 g via ORAL
  Filled 2022-09-01 (×6): qty 2

## 2022-09-01 MED ORDER — HEPARIN (PORCINE) 25000 UT/250ML-% IV SOLN
1850.0000 [IU]/h | INTRAVENOUS | Status: DC
  Administered 2022-09-01 (×2): 1650 [IU]/h via INTRAVENOUS
  Filled 2022-09-01: qty 250

## 2022-09-01 MED ORDER — METOPROLOL TARTRATE 12.5 MG HALF TABLET
12.5000 mg | ORAL_TABLET | Freq: Two times a day (BID) | ORAL | Status: DC
  Administered 2022-09-01 (×2): 12.5 mg via ORAL
  Filled 2022-09-01 (×2): qty 1

## 2022-09-01 MED ORDER — SODIUM CHLORIDE 1 G PO TABS
1.0000 g | ORAL_TABLET | Freq: Three times a day (TID) | ORAL | Status: DC
  Administered 2022-09-01: 1 g via ORAL
  Filled 2022-09-01: qty 1

## 2022-09-01 MED ORDER — CALCIUM GLUCONATE-NACL 2-0.675 GM/100ML-% IV SOLN
2.0000 g | Freq: Once | INTRAVENOUS | Status: AC
  Administered 2022-09-01: 2000 mg via INTRAVENOUS
  Filled 2022-09-01: qty 100

## 2022-09-01 MED ORDER — SODIUM CHLORIDE 3 % IV SOLN
INTRAVENOUS | Status: DC
  Filled 2022-09-01 (×3): qty 500

## 2022-09-01 MED ORDER — POTASSIUM CHLORIDE 20 MEQ PO PACK
40.0000 meq | PACK | Freq: Once | ORAL | Status: AC
  Administered 2022-09-01: 40 meq via ORAL
  Filled 2022-09-01: qty 2

## 2022-09-01 NOTE — Progress Notes (Signed)
PHARMACY - PHYSICIAN COMMUNICATION CRITICAL VALUE ALERT - BLOOD CULTURE IDENTIFICATION (BCID)  Douglas Ballard is an 72 y.o. male who presented to [facility] on 09/10/2022 for evaluation s/p fall at home . He was started on ceftriaxone on admission for PNA and changed to augmentin on 1/12.  One of four blood culture bottles collected on 1/11 has G{C (BCID= staph epi).   Name of physician (or Provider) Contacted: Dr. [name]  Current antibiotics: augmentin  Changes to prescribed antibiotics recommended:  - per Dr. [name], suspects staph epi is a contaminant. - Continue augmentin for PNA (this should also cover for staph epi)  Results for orders placed or performed during the hospital encounter of 09/13/2022  Blood Culture ID Panel (Reflexed) (Collected: 09/15/2022 12:22 PM)  Result Value Ref Range   Enterococcus faecalis NOT DETECTED NOT DETECTED   Enterococcus Faecium NOT DETECTED NOT DETECTED   Listeria monocytogenes NOT DETECTED NOT DETECTED   Staphylococcus species DETECTED (A) NOT DETECTED   Staphylococcus aureus (BCID) NOT DETECTED NOT DETECTED   Staphylococcus epidermidis DETECTED (A) NOT DETECTED   Staphylococcus lugdunensis NOT DETECTED NOT DETECTED   Streptococcus species NOT DETECTED NOT DETECTED   Streptococcus agalactiae NOT DETECTED NOT DETECTED   Streptococcus pneumoniae NOT DETECTED NOT DETECTED   Streptococcus pyogenes NOT DETECTED NOT DETECTED   A.calcoaceticus-baumannii NOT DETECTED NOT DETECTED   Bacteroides fragilis NOT DETECTED NOT DETECTED   Enterobacterales NOT DETECTED NOT DETECTED   Enterobacter cloacae complex NOT DETECTED NOT DETECTED   Escherichia coli NOT DETECTED NOT DETECTED   Klebsiella aerogenes NOT DETECTED NOT DETECTED   Klebsiella oxytoca NOT DETECTED NOT DETECTED   Klebsiella pneumoniae NOT DETECTED NOT DETECTED   Proteus species NOT DETECTED NOT DETECTED   Salmonella species NOT DETECTED NOT DETECTED   Serratia marcescens NOT DETECTED NOT DETECTED    Haemophilus influenzae NOT DETECTED NOT DETECTED   Neisseria meningitidis NOT DETECTED NOT DETECTED   Pseudomonas aeruginosa NOT DETECTED NOT DETECTED   Stenotrophomonas maltophilia NOT DETECTED NOT DETECTED   Candida albicans NOT DETECTED NOT DETECTED   Candida auris NOT DETECTED NOT DETECTED   Candida glabrata NOT DETECTED NOT DETECTED   Candida krusei NOT DETECTED NOT DETECTED   Candida parapsilosis NOT DETECTED NOT DETECTED   Candida tropicalis NOT DETECTED NOT DETECTED   Cryptococcus neoformans/gattii NOT DETECTED NOT DETECTED   Methicillin resistance mecA/C NOT DETECTED NOT DETECTED    Lynelle Doctor 09/01/2022  7:40 AM

## 2022-09-01 NOTE — Progress Notes (Signed)
Per Dr [name] ok to hold neb if heart is elevated.

## 2022-09-01 NOTE — Progress Notes (Signed)
ANTICOAGULATION CONSULT NOTE - Follow Up Consult  Pharmacy Consult for heparin Indication: atrial fibrillation (PTA xarelto on hold)  Allergies  Allergen Reactions   Hydrochlorothiazide Other (See Comments)    Pt gets hyponatremia   Lasix [Furosemide] Other (See Comments)    Sodium levels drop when take   Nsaids Other (See Comments)    Kidney disease/failure   Sulfa Antibiotics Other (See Comments)    headaches    Patient Measurements: Height: '5\' 9"'$  (175.3 cm) Weight: 72.6 kg (160 lb) IBW/kg (Calculated) : 70.7 Heparin Dosing Weight: 73 kg  Vital Signs: Temp: 98.3 F (36.8 C) (01/13 0400) Temp Source: Oral (01/13 0400) BP: 169/97 (01/13 0300) Pulse Rate: 112 (01/13 0300)  Labs: Recent Labs    08/21/2022 0855 08/23/2022 0855 08/31/2022 1130 09/10/2022 1336 08/31/22 0330 08/31/22 0954 08/31/22 1431 08/31/22 1807 08/31/22 2144 09/01/22 0409  HGB 10.3*  --   --   --  8.7*  --   --   --   --  8.5*  HCT 28.3*  --   --   --  24.2*  --   --   --   --  23.3*  PLT 185  --   --   --  120*  --   --   --   --  108*  APTT  --   --   --  47*  --  35  --   --   --   --   LABPROT  --   --   --  40.4* 27.0*  --   --   --   --   --   INR  --   --   --  4.2* 2.5*  --   --   --   --   --   HEPARINUNFRC  --    < >  --  >1.10*  --  <0.10*  --  <0.10*  --  <0.10*  CREATININE 5.11*  --   --   --  5.43*  --  5.52*  --  5.62*  --   TROPONINIHS 36*  --  37*  --   --   --   --   --   --   --    < > = values in this interval not displayed.     Estimated Creatinine Clearance: 12.1 mL/min (A) (by C-G formula based on SCr of 5.62 mg/dL (H)).   Medications:  - on xarelto 15 mg daily PTA (last dose taken on 08/28/22 PM)  Assessment: Patient is a 72 y.o M with hx EtOH and  afib on xarelto PTA who presented to the ED on 08/21/2022 for evaluation after he fell out of bed.  Head CT showed no acute intracranial findings but noted laceration in the left frontotemporal region. Heparin drip ordered on 1/11  but then d/ced on the same day.  INR was noted to be 4.2 on 1/11. INR is likely falsely elevated d/t effect of xarelto and decreased clearance of drug d/t renal insufficiency.  Pharmacy has been consulted on 1/12 to start heparin drip for afib.  Today, 09/01/2022: -HL < .1 despite bolus and increase in rate - hgb 8.5, plts down 108k - scr up 5.62 (crcl~12) Per RN no interruptions and no bleeding  Goal of Therapy:  Heparin level 0.3-0.7 units/ml Monitor platelets by anticoagulation protocol: Yes   Plan:  - heparin bolus 2000 units x 1 - increase heparin drip to 1500 units/hr - check  8 hr heparin level  - monitor for s/sx bleeding  Dolly Rias RPh 09/01/2022, 5:20 AM

## 2022-09-01 NOTE — Progress Notes (Signed)
NAME:  Douglas Ballard, MRN:  [id number], DOB:  [birth year], LOS: 2 ADMISSION DATE:  08/21/2022, CONSULTATION DATE:  09/17/2022 REFERRING MD:  Dr. Francia Greaves, CHIEF COMPLAINT:  fall   History of Present Illness:  HPI obtained from EMR as patient is encephalopathic and no family able to be reached.   72 year old male with prior hx as below, significant for Afib on Xarelto, HFpEF, CKD IV, and DMT2 presented from home by EMS after unwitnessed fall.  Patient reported to EMS, fell out of bed due to weakness while trying to go the bathroom, denied LOC, and sustained a small laceration to the left temporal area.  Additionally complained of nausea and vomiting for one month in which he has not had evaluated.  Was alert and oriented initially and denied any neck or back pain.   In ER, patient found in afib with RVR, no temperature available yet, normoxia, and SBP 130-150's.  While in ER patient c/o of neck, back, and ankle pain then became more agitated, altered and tremulous with concern for ETOH withdrawal.  Given ativan '1mg'$  twice.  Additionally found to be in Afib with RVR, placed on cardizem gtt. Labs noted for Na 113, Cl 79, sCr 4.5, BUN/ sCr 64/ 5.11 (prior 54/ 3.26 04/2021), bicarb 15, AG 19, glucose 179, AST/ ALT 712/ 553, t. Bili 2.1, BNP 2970, trop hs 36, WBC 19.8, Hgb 10.3, Hct 28, plts 185, neg ETOH level, EKG afib with incomplete RBBB, rate 125, Qtc 579.  CTH and cervical neg for acute fx or intracranial process. CXR showed cardiomegaly with mild pulmonary venous congestion and possible early right perihilar opacity.  XR left/ right ankle neg, lumbar and pelvis neg for fx.  He has received 1L LR in ER.  PCCM called for admit.  Patient remains less talkative after ativan, remains tremulous, and confused.   Pertinent  Medical History  Former smoker, Afib on Xarelto, ETOH use, HTN, HFpEF, CKD IV, DMT2, anemia of chronic disease, colovesical fistula  Significant Hospital Events: Including procedures,  antibiotic start and stop dates in addition to other pertinent events   1/11 admitted  1/12 sodium level improved on 3% saline, bicarb drip started  Interim History / Subjective:   No complaints this morning  UOP 1,139m in past 24 hours  Objective   Blood pressure (!) 173/111, pulse (!) 121, temperature 98.3 F (36.8 C), temperature source Oral, resp. rate 18, height '5\' 9"'$  (1.753 m), weight 72.6 kg, SpO2 94 %.        Intake/Output Summary (Last 24 hours) at 09/01/2022 0850 Last data filed at 09/01/2022 0700 Gross per 24 hour  Intake 2436.35 ml  Output 1175 ml  Net 1261.35 ml   Filed Weights   09/13/2022 0831  Weight: 72.6 kg   Examination: General:  ill appearing male, laying in bed, no acute distress HEENT: MM pink/dry, no JVD, AT/[place], PERRL, no scleral icterus Neuro:  Alert and oriented x 3, moving all extremities CV: irregularly irregular, no murmur PULM:  mild end expiratory wheezes - scattered. GI: +bs, soft, NT/ ND Extremities: warm/dry, trace pedal edema  Skin: no rashes or lesions  RUQ [place]01/29/2024 Unremarkable  Echo 09/12/2022 EF 20 to 25%, global hypokinesis RV systolic function mildly reduced, size moderately enlarged. Mildly elevated PA pressure. LA and RA severely dilated.  Renal [place]1/11/24 Within normal limits  Resolved Hospital Problem list   Hypomagnesemia  Assessment & Plan:   Acute metabolic/ toxic encephalopathy ETOH withdrawal - Mental status much  improved - high dose thiamine  - CIWA protocol with PRN ativan - correct metabolic derangements as below   Elevated LFTs Elevated T. Bilirubin Elevated INR In setting of heart failure with hepatic congestion - RUQ [place] normal - tylenol level not elevated - hepatitis panel negqative - monitor INR and LFTs  Hyponatremia Hypocalcemia AKI on CKD stage IV AGMA/ lactic acidosis Hypovolemic, hypo-osmolar - 1g NaCl tabs TID - renal US is normal - Serum bicarb normalized, stop bicarb drip -  Replace electrolytes as indicated - Avoid nephrotoxic agents, ensure adequate renal perfusion  Acute Systolic Heart Failure Afib with RVR HTN - Prior TTE 02/2020> EF 55-60%, G3DD, normal RV, mild MR - New Echo with EF 20-25% - Troponins not significant elevated and no ischemic EKG changes - HR elevated, will start metoprolol 12.'5mg'$  BID - Continue aggressive electrolyte replacement - TSH within normal limits - goal Mag > 2, K> 4 - Heparin per pharmacy  - hold holme clonidine, lasix, bidil, labetalol for now  Prolonged QTC - maximize electrolytes as above and avoid Qtc prolonging meds - tele  Suspected sepsis, possible aspiration PNA - remains normotensive, goal MAP> 65 - blood cultures with staph epi, likely contaminate - will complete course of antibiotics with augmentin - monitor fever curve/ WBC   DM with hyperglycemia - SSI sensitive with CBG q4  Anemia of chronic disease - H/H at baseline around 10 - trend CBC/ transfuse for Hgb < 7 or active bleeding   Left temporal scalp laceration s/p staple 2/2 fall  - staple removal in 7-10 days   Best Practice (right click and "Reselect all SmartList Selections" daily)   Diet/type: Regular consistency (see orders) DVT prophylaxis: systemic heparin GI prophylaxis: PPI Lines: N/A Foley:  N/A Code Status:  full code Last date of multidisciplinary goals of care discussion [pending]  Labs   CBC: Recent Labs  Lab 09/15/2022 0855 08/31/22 0330 09/01/22 0409  WBC 19.8* 15.4* 12.2*  NEUTROABS 17.0*  --   --   HGB 10.3* 8.7* 8.5*  HCT 28.3* 24.2* 23.3*  MCV 87.3 89.0 86.0  PLT 185 120* 108*    Basic Metabolic Panel: Recent Labs  Lab 08/29/2022 0855 08/21/2022 1222 09/16/2022 1754 09/06/2022 2258 08/31/22 0330 08/31/22 0753 08/31/22 1135 08/31/22 1431 08/31/22 2144 09/01/22 0409  NA 113*   < > 115*   < > 119* 123* 126* 125* 123* 122*  K 4.5  --   --   --  3.8  --   --  3.8 4.1 3.4*  CL 79*  --   --   --  85*  --   --   91* 89* 83*  CO2 15*  --   --   --  14*  --   --  10* 13* 22  GLUCOSE 179*  --   --   --  72  --   --  84 176* 120*  BUN 64*  --   --   --  71*  --   --  78* 79* 75*  CREATININE 5.11*  --   --   --  5.43*  --   --  5.52* 5.62* 4.92*  CALCIUM 4.9*  --   --   --  4.6*  --   --  5.2* 6.2* 6.6*  MG 0.7*  --  1.4*  --  2.1  --   --   --   --   --   PHOS 5.3*  --   --   --   --   --   --   --   --   --    < > =  values in this interval not displayed.   GFR: Estimated Creatinine Clearance: 13.8 mL/min (A) (by C-G formula based on SCr of 4.92 mg/dL (H)). Recent Labs  Lab 09/06/2022 0855 09/02/2022 1135 09/14/2022 1222 08/27/2022 1648 08/31/22 0330 09/01/22 0409  PROCALCITON  --   --  0.39  --   --   --   WBC 19.8*  --   --   --  15.4* 12.2*  LATICACIDVEN  --  2.2*  --  1.7  --   --     Liver Function Tests: Recent Labs  Lab 09/18/2022 0855 08/31/22 0330  AST 712* 1,370*  ALT 553* 856*  ALKPHOS 75 69  BILITOT 2.1* 1.4*  PROT 6.9 5.8*  ALBUMIN 3.1* 2.6*   Recent Labs  Lab 08/20/2022 1222  LIPASE 49   Recent Labs  Lab 08/27/2022 1131  AMMONIA 26    ABG    Component Value Date/Time   HCO3 18.9 (L) 03/14/2020 0636   TCO2 23 06/26/2011 0946   ACIDBASEDEF 6.0 (H) 03/14/2020 0636   O2SAT 33.7 03/14/2020 0636     Coagulation Profile: Recent Labs  Lab 09/12/2022 1336 08/31/22 0330  INR 4.2* 2.5*    Cardiac Enzymes: No results for input(s): "CKTOTAL", "CKMB", "CKMBINDEX", "TROPONINI" in the last 168 hours.  HbA1C: Hgb A1c MFr Bld  Date/Time Value Ref Range Status  05/16/2021 11:47 AM 6.8 (H) 4.6 - 6.5 % Final    Comment:    Glycemic Control Guidelines for People with Diabetes:Non Diabetic:  <6%Goal of Therapy: <7%Additional Action Suggested:  >8%   03/14/2020 10:16 AM 5.9 (H) 4.8 - 5.6 % Final    Comment:    (NOTE) Pre diabetes:          5.7%-6.4%  Diabetes:              >6.4%  Glycemic control for   <7.0% adults with diabetes     CBG: Recent Labs  Lab  08/31/22 1612 08/31/22 1954 09/01/22 0006 09/01/22 0402 09/01/22 0803  GLUCAP 98 170* 156* 139* 139*    Critical care time: 35 mins     Freda Jackson, MD [facility] Office: [phone]   See Amion for personal pager PCCM on call pager [phone] until 7pm. Please call Elink 7p-7a. [phone]

## 2022-09-01 NOTE — Progress Notes (Incomplete)
ANTICOAGULATION CONSULT NOTE - Follow Up Consult  Pharmacy Consult for heparin Indication: atrial fibrillation (PTA xarelto on hold)  Allergies  Allergen Reactions   Hydrochlorothiazide Other (See Comments)    Pt gets hyponatremia   Lasix [Furosemide] Other (See Comments)    Sodium levels drop when take   Nsaids Other (See Comments)    Kidney disease/failure   Sulfa Antibiotics Other (See Comments)    headaches    Patient Measurements: Height: '5\' 9"'$  (175.3 cm) Weight: 72.6 kg (160 lb) IBW/kg (Calculated) : 70.7 Heparin Dosing Weight: 73 kg  Vital Signs: Temp: 97.7 F (36.5 C) (01/13 1255) Temp Source: Oral (01/13 1255) BP: 167/139 (01/13 0900) Pulse Rate: 127 (01/13 1000)  Labs: Recent Labs    09/12/2022 0855 09/07/2022 0855 08/23/2022 1130 09/04/2022 1336 08/31/22 0330 08/31/22 0954 08/31/22 1431 08/31/22 1807 08/31/22 2144 09/01/22 0409  HGB 10.3*  --   --   --  8.7*  --   --   --   --  8.5*  HCT 28.3*  --   --   --  24.2*  --   --   --   --  23.3*  PLT 185  --   --   --  120*  --   --   --   --  108*  APTT  --   --   --  47*  --  35  --   --   --   --   LABPROT  --   --   --  40.4* 27.0*  --   --   --   --   --   INR  --   --   --  4.2* 2.5*  --   --   --   --   --   HEPARINUNFRC  --    < >  --  >1.10*  --  <0.10*  --  <0.10*  --  <0.10*  CREATININE 5.11*  --   --   --  5.43*  --  5.52*  --  5.62* 4.92*  TROPONINIHS 36*  --  37*  --   --   --   --   --   --   --    < > = values in this interval not displayed.     Estimated Creatinine Clearance: 13.8 mL/min (A) (by C-G formula based on SCr of 4.92 mg/dL (H)).   Medications:  - on xarelto 15 mg daily PTA (last dose taken on 08/28/22 PM)  Assessment: Patient is a 72 y.o M with hx EtOH and  afib on xarelto PTA who presented to the ED on 08/26/2022 for evaluation after he fell out of bed.  Head CT showed no acute intracranial findings but noted laceration in the left frontotemporal region. Heparin drip ordered on 1/11  but then d/ced on the same day.  INR was noted to be 4.2 on 1/11. INR is likely falsely elevated d/t effect of xarelto and decreased clearance of drug d/t renal insufficiency.  Pharmacy has been consulted on 1/12 to start heparin drip for afib.  Today, 09/01/2022: - heparin level is  - hgb 8.5, plts down 108K  - scr down 4.92 (crcl~14) - in afib  Goal of Therapy:  Heparin level 0.3-0.7 units/ml Monitor platelets by anticoagulation protocol: Yes   Plan:  - heparin drip at  units/hr - check 8 hr heparin level  - monitor for s/sx bleeding  Novice Vrba P 09/01/2022,2:41 PM

## 2022-09-01 NOTE — Progress Notes (Signed)
[name] Patient Name: Douglas Ballard DOB: [birth year] MRN: [id number]   Date of Service  09/01/2022  HPI/Events of Note  K 3.4 Na 122.  Trending back down.  Etiology not clear. Creat 4.9  eICU Interventions  Kcl 40 meq x 1 If next sodium continuing to trend down restart 3% saline     Intervention Category Minor Interventions: Electrolytes abnormality - evaluation and management  Mauri Brooklyn, P 09/01/2022, 5:30 AM

## 2022-09-01 NOTE — Progress Notes (Signed)
ANTICOAGULATION CONSULT NOTE - Follow Up Consult  Pharmacy Consult for heparin Indication: atrial fibrillation (PTA xarelto on hold)  Allergies  Allergen Reactions   Hydrochlorothiazide Other (See Comments)    Pt gets hyponatremia   Lasix [Furosemide] Other (See Comments)    Sodium levels drop when take   Nsaids Other (See Comments)    Kidney disease/failure   Sulfa Antibiotics Other (See Comments)    headaches    Patient Measurements: Height: '5\' 9"'$  (175.3 cm) Weight: 72.6 kg (160 lb) IBW/kg (Calculated) : 70.7 Heparin Dosing Weight: 73 kg  Vital Signs: Temp: 97.7 F (36.5 C) (01/13 1255) Temp Source: Oral (01/13 1255) BP: 167/139 (01/13 0900) Pulse Rate: 127 (01/13 1000)  Labs: Recent Labs    08/20/2022 0855 09/18/2022 0855 08/24/2022 1130 09/19/2022 1336 08/31/22 0330 08/31/22 0954 08/31/22 1431 08/31/22 1807 08/31/22 2144 09/01/22 0409 09/01/22 1412  HGB 10.3*  --   --   --  8.7*  --   --   --   --  8.5*  --   HCT 28.3*  --   --   --  24.2*  --   --   --   --  23.3*  --   PLT 185  --   --   --  120*  --   --   --   --  108*  --   APTT  --   --   --  47*  --  35  --   --   --   --   --   LABPROT  --   --   --  40.4* 27.0*  --   --   --   --   --   --   INR  --   --   --  4.2* 2.5*  --   --   --   --   --   --   HEPARINUNFRC  --    < >  --  >1.10*  --  <0.10*  --  <0.10*  --  <0.10* 0.20*  CREATININE 5.11*  --   --   --  5.43*  --    < >  --  5.62* 4.92* 5.57*  TROPONINIHS 36*  --  37*  --   --   --   --   --   --   --   --    < > = values in this interval not displayed.     Estimated Creatinine Clearance: 12.2 mL/min (A) (by C-G formula based on SCr of 5.57 mg/dL (H)).   Medications:  - on xarelto 15 mg daily PTA (last dose taken on 08/28/22 PM)  Assessment: Patient is a 72 y.o M with hx EtOH and  afib on xarelto PTA who presented to the ED on 08/23/2022 for evaluation after he fell out of bed.  Head CT showed no acute intracranial findings but noted laceration in  the left frontotemporal region. Heparin drip ordered on 1/11 but then d/ced on the same day.  INR was noted to be 4.2 on 1/11. INR is likely falsely elevated d/t effect of xarelto and decreased clearance of drug d/t renal insufficiency.  Pharmacy has been consulted on 1/12 to start heparin drip for afib.  Today, 09/01/2022: - heparin level 0.2, subtherapeutic but increasing after multiple boluses and rate increases - hgb 8.5, plts down 108k - scr up 5.57 (crcl~12) - Per RN no interruptions and no bleeding  Goal of Therapy:  Heparin level 0.3-0.7 units/ml Monitor platelets by anticoagulation protocol: Yes   Plan:  - increase heparin drip to 1650 units/hr - check 8 hr heparin level  - monitor for s/sx bleeding  Peggyann Juba, PharmD, BCPS Pharmacy: [phone] 09/01/2022, 3:48 PM

## 2022-09-01 NOTE — Progress Notes (Signed)
[facility] Progress Note  Subjective: BP's a bit high, HR 120s-130s w/ afib. RA. Na down to 122 this am. I/O + 500 yest. Creat down 4.9 today, CO2 up 22.   Vitals:   09/01/22 0300 09/01/22 0400 09/01/22 0500 09/01/22 0600  BP: (!) 169/97 (!) 147/95 (!) 165/89 (!) 173/111  Pulse: (!) 112 (!) 115 (!) 119 (!) 121  Resp: '18 17 16 18  '$ Temp:  98.3 F (36.8 C)    TempSrc:  Oral    SpO2: 97% 91% 95% 94%  Weight:      Height:        Exam: Gen- awake, up in chair, no distress JVP 10-12 Chest R clear, scant rales L base RRR no MRG Abd soft ntnd no mass or ascites +bs GU normal male Ext no LE edema Neuro as above, moves all ext, ox 3     Home meds - clonidine 0.1 hs, lasix 40 qd, imdur 60, januvia, labetalol 200 bid, protonix, xarelto, ambien, eszopiclone, hydralazine 50 mg, MVI, vits/ prns/ supps    Date   Creat  eGFR   2011- 2012  0.67- 1.00   2015- 2017  1.00- 2.09   2018   2.29  31 ml/min   2019    2.40- 2.60   2020   2.15- 3.47   2021   2.82- 4.80   Mar- sept 2022 3.26- 3.70 18 ml/min , CKD IV   April 2023  3.71  17   Sept 2023  3.46  18   08/15/22  4.00  15 ml/min   08/25/2022  5.11  11   08/31/22   5.43  11 ml/min, CKD V       UA - 100 prot, 0-5 wbc/ rbc    UNa  <10      UOsm 272    UCr 82    ECHO - new low EF 25-30%   Assessment/ Plan: Hyponatremia - euvolemic vs possibly hypervolemic. Could have beer potomania (low solute). UNa low and UOsm mid-range. Na+ was 114 on presentation. SP 3% saline from 1/11- 11/13. TSH and cortisol wnl. Na+ down today after 3% stopped. Will start salt tabs 2 gm tid and fluid restriction for possible SIADH. If not improving will need to resume 3% saline. F/u Na+ q 6hrs.  AKI on CKD4 - b/l creatinine 3.7- 4.0 from sept - dec 2023, eGFR 15- 28m/min. Creat here 5.1 on admission in setting of AMS, hyponatremia, etoh w/d. Gentle IVF's w/ sod bicarb started 1/12 and creat down today w/o signs of vol ^. IVF"s dc'd. Follow creatinine.  No indication for RRT yet, mentation sig better.  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 Getting IV Ca++ continuous per CCM.  Vol - euvolemic vs possibly a bit up. Didn't really respond to IV diuretics but only had a couple of doses. No SOB or hypoxia and holding off on diuretics for now due to creat bump.  HTN - was on 3 BP lowering meds at home. BP's here are stable, low normal.  Etoh abuse - per pmd AMS - multifactorial due to low Na, etoh w/d, other Atrial fib - sp diltiazem HFrEF - echo showing LVEF 25-30% H/o CVA       Rob Rashaun Curl 09/01/2022, 8:01 AM   Recent Labs  Lab 08/29/2022 0855 08/31/22 0330 08/31/22 1431 08/31/22 2144  09/01/22 0409  HGB 10.3* 8.7*  --   --  8.5*  ALBUMIN 3.1* 2.6*  --   --   --   CALCIUM 4.9* 4.6*   < > 6.2* 6.6*  PHOS 5.3*  --   --   --   --   CREATININE 5.11* 5.43*   < > 5.62* 4.92*  K 4.5 3.8   < > 4.1 3.4*   < > = values in this interval not displayed.    No results for input(s): "IRON", "TIBC", "FERRITIN" in the last 168 hours. Inpatient medications:  amoxicillin-clavulanate  1 tablet Oral BID   Chlorhexidine Gluconate Cloth  6 each Topical Daily   insulin aspart  0-9 Units Subcutaneous Q4H   ipratropium-albuterol  3 mL Nebulization TID    calcium gluconate     heparin 1,500 Units/hr (09/01/22 0656)   sodium bicarbonate 150 mEq in sterile water 1,150 mL infusion 65 mL/hr at 09/01/22 0656   thiamine (VITAMIN B1) injection     docusate sodium, labetalol, LORazepam, polyethylene glycol

## 2022-09-02 DIAGNOSIS — E871 Hypo-osmolality and hyponatremia: Secondary | ICD-10-CM | POA: Diagnosis not present

## 2022-09-02 LAB — HEPARIN LEVEL (UNFRACTIONATED)
Heparin Unfractionated: 0.16 IU/mL — ABNORMAL LOW (ref 0.30–0.70)
Heparin Unfractionated: <0.1 IU/mL — ABNORMAL LOW (ref 0.30–0.70)

## 2022-09-02 LAB — BASIC METABOLIC PANEL
Anion gap: 22 — ABNORMAL HIGH (ref 5–15)
BUN: 91 mg/dL — ABNORMAL HIGH (ref 8–23)
CO2: 11 mmol/L — ABNORMAL LOW (ref 22–32)
Calcium: 6.6 mg/dL — ABNORMAL LOW (ref 8.9–10.3)
Chloride: 95 mmol/L — ABNORMAL LOW (ref 98–111)
Creatinine, Ser: 6.14 mg/dL — ABNORMAL HIGH (ref 0.61–1.24)
GFR, Estimated: 9 mL/min — ABNORMAL LOW (ref >60)
Glucose, Bld: 120 mg/dL — ABNORMAL HIGH (ref 70–99)
Potassium: 4.8 mmol/L (ref 3.5–5.1)
Sodium: 128 mmol/L — ABNORMAL LOW (ref 135–145)

## 2022-09-02 LAB — CBC
HCT: 26.4 % — ABNORMAL LOW (ref 39.0–52.0)
HCT: 28.2 % — ABNORMAL LOW (ref 39.0–52.0)
Hemoglobin: 10 g/dL — ABNORMAL LOW (ref 13.0–17.0)
Hemoglobin: 9.4 g/dL — ABNORMAL LOW (ref 13.0–17.0)
MCH: 31.6 pg (ref 26.0–34.0)
MCH: 32 pg (ref 26.0–34.0)
MCHC: 35.5 g/dL (ref 30.0–36.0)
MCHC: 35.6 g/dL (ref 30.0–36.0)
MCV: 89.2 fL (ref 80.0–100.0)
MCV: 89.8 fL (ref 80.0–100.0)
Platelets: 66 10*3/uL — ABNORMAL LOW (ref 150–400)
Platelets: 85 10*3/uL — ABNORMAL LOW (ref 150–400)
RBC: 2.94 MIL/uL — ABNORMAL LOW (ref 4.22–5.81)
RBC: 3.16 MIL/uL — ABNORMAL LOW (ref 4.22–5.81)
RDW: 11.9 % (ref 11.5–15.5)
RDW: 12.3 % (ref 11.5–15.5)
WBC: 15.4 10*3/uL — ABNORMAL HIGH (ref 4.0–10.5)
WBC: 16.7 10*3/uL — ABNORMAL HIGH (ref 4.0–10.5)
nRBC: 0.2 % (ref 0.0–0.2)
nRBC: 0.5 % — ABNORMAL HIGH (ref 0.0–0.2)

## 2022-09-02 LAB — GLUCOSE, CAPILLARY
Glucose-Capillary: 106 mg/dL — ABNORMAL HIGH (ref 70–99)
Glucose-Capillary: 114 mg/dL — ABNORMAL HIGH (ref 70–99)
Glucose-Capillary: 122 mg/dL — ABNORMAL HIGH (ref 70–99)
Glucose-Capillary: 128 mg/dL — ABNORMAL HIGH (ref 70–99)
Glucose-Capillary: 133 mg/dL — ABNORMAL HIGH (ref 70–99)
Glucose-Capillary: 138 mg/dL — ABNORMAL HIGH (ref 70–99)

## 2022-09-02 LAB — COMPREHENSIVE METABOLIC PANEL
ALT: 914 U/L — ABNORMAL HIGH (ref 0–44)
AST: 718 U/L — ABNORMAL HIGH (ref 15–41)
Albumin: 2.9 g/dL — ABNORMAL LOW (ref 3.5–5.0)
Alkaline Phosphatase: 128 U/L — ABNORMAL HIGH (ref 38–126)
Anion gap: 20 — ABNORMAL HIGH (ref 5–15)
BUN: 86 mg/dL — ABNORMAL HIGH (ref 8–23)
CO2: 13 mmol/L — ABNORMAL LOW (ref 22–32)
Calcium: 7.2 mg/dL — ABNORMAL LOW (ref 8.9–10.3)
Chloride: 92 mmol/L — ABNORMAL LOW (ref 98–111)
Creatinine, Ser: 5.79 mg/dL — ABNORMAL HIGH (ref 0.61–1.24)
GFR, Estimated: 10 mL/min — ABNORMAL LOW (ref >60)
Glucose, Bld: 128 mg/dL — ABNORMAL HIGH (ref 70–99)
Potassium: 4.5 mmol/L (ref 3.5–5.1)
Sodium: 125 mmol/L — ABNORMAL LOW (ref 135–145)
Total Bilirubin: 2.4 mg/dL — ABNORMAL HIGH (ref 0.3–1.2)
Total Protein: 6.4 g/dL — ABNORMAL LOW (ref 6.5–8.1)

## 2022-09-02 LAB — SODIUM
Sodium: 125 mmol/L — ABNORMAL LOW (ref 135–145)
Sodium: 130 mmol/L — ABNORMAL LOW (ref 135–145)
Sodium: 131 mmol/L — ABNORMAL LOW (ref 135–145)
Sodium: 133 mmol/L — ABNORMAL LOW (ref 135–145)

## 2022-09-02 LAB — RENAL FUNCTION PANEL
Albumin: 2.6 g/dL — ABNORMAL LOW (ref 3.5–5.0)
Anion gap: 23 — ABNORMAL HIGH (ref 5–15)
BUN: 85 mg/dL — ABNORMAL HIGH (ref 8–23)
CO2: 11 mmol/L — ABNORMAL LOW (ref 22–32)
Calcium: 6.8 mg/dL — ABNORMAL LOW (ref 8.9–10.3)
Chloride: 92 mmol/L — ABNORMAL LOW (ref 98–111)
Creatinine, Ser: 5.95 mg/dL — ABNORMAL HIGH (ref 0.61–1.24)
GFR, Estimated: 9 mL/min — ABNORMAL LOW (ref >60)
Glucose, Bld: 120 mg/dL — ABNORMAL HIGH (ref 70–99)
Phosphorus: 7.4 mg/dL — ABNORMAL HIGH (ref 2.5–4.6)
Potassium: 5.1 mmol/L (ref 3.5–5.1)
Sodium: 126 mmol/L — ABNORMAL LOW (ref 135–145)

## 2022-09-02 LAB — CULTURE, BLOOD (ROUTINE X 2): Special Requests: ADEQUATE

## 2022-09-02 LAB — MAGNESIUM: Magnesium: 2 mg/dL (ref 1.7–2.4)

## 2022-09-02 MED ORDER — METOPROLOL TARTRATE 25 MG PO TABS
25.0000 mg | ORAL_TABLET | Freq: Two times a day (BID) | ORAL | Status: DC
  Administered 2022-09-02 (×2): 25 mg via ORAL
  Filled 2022-09-02 (×2): qty 1

## 2022-09-02 MED ORDER — SODIUM BICARBONATE 650 MG PO TABS
650.0000 mg | ORAL_TABLET | Freq: Three times a day (TID) | ORAL | Status: DC
  Administered 2022-09-02 (×3): 650 mg via ORAL
  Filled 2022-09-02 (×3): qty 1

## 2022-09-02 MED ORDER — FUROSEMIDE 40 MG PO TABS
40.0000 mg | ORAL_TABLET | Freq: Every day | ORAL | Status: DC
  Administered 2022-09-02: 40 mg via ORAL
  Filled 2022-09-02: qty 1

## 2022-09-02 MED ORDER — CALCIUM GLUCONATE-NACL 2-0.675 GM/100ML-% IV SOLN
2.0000 g | Freq: Once | INTRAVENOUS | Status: AC
  Administered 2022-09-02: 2000 mg via INTRAVENOUS
  Filled 2022-09-02: qty 100

## 2022-09-02 MED ORDER — SODIUM CHLORIDE 3 % IV SOLN
INTRAVENOUS | Status: DC
  Filled 2022-09-02 (×2): qty 500

## 2022-09-02 MED ORDER — SODIUM CHLORIDE 0.9 % IV SOLN
12.5000 mg | INTRAVENOUS | Status: AC
  Administered 2022-09-03 (×2): 12.5 mg via INTRAVENOUS
  Filled 2022-09-02 (×2): qty 12.5

## 2022-09-02 MED ORDER — HEPARIN BOLUS VIA INFUSION
2000.0000 [IU] | Freq: Once | INTRAVENOUS | Status: AC
  Administered 2022-09-02: 2000 [IU] via INTRAVENOUS
  Filled 2022-09-02: qty 2000

## 2022-09-02 NOTE — Progress Notes (Signed)
[facility] Progress Note Patient Name: Douglas Ballard DOB: 1950-08-26 MRN: [id number]   Date of Service  09/02/2022  HPI/Events of Note  Sodium 133 from 128 on 3% saline Complaining of nausea  eICU Interventions  3% placed on hold. Conferred with pharmacy and continue to monitor Na closely Phenergan prn ordered     Intervention Category Intermediate Interventions: Electrolyte abnormality - evaluation and management Minor Interventions: Routine modifications to care plan (e.g. PRN medications for pain, fever)  Shona Needles Esparanza Krider 09/02/2022, 11:26 PM

## 2022-09-02 NOTE — Progress Notes (Signed)
[facility] Progress Note  Subjective: RR stable, HR's a bit better in the 100-110 range. BP's on the high side 150 /95 range. Serum Na+ up to 126.   Vitals:   09/02/22 0300 09/02/22 0359 09/02/22 0400 09/02/22 0500  BP:  (!) 153/97 (!) 153/97 (!) 148/102  Pulse: (!) 108 100 (!) 111 (!) 110  Resp: (!) 23 (!) 21 20 (!) 48  Temp:  97.6 F (36.4 C)    TempSrc:  Oral    SpO2: 98% 100% 94% (!) 89%  Weight:      Height:        Exam: Gen- awake, up in chair, no distress JVP 10-12 Chest R clear, scant rales L base RRR no MRG Abd soft ntnd no mass or ascites +bs GU normal male Ext no LE edema Neuro as above, moves all ext, ox 3     Home meds - clonidine 0.1 hs, lasix 40 qd, imdur 60, januvia, labetalol 200 bid, protonix, xarelto, ambien, eszopiclone, hydralazine 50 mg, MVI, vits/ prns/ supps    Date   Creat  eGFR   2011- 2012  0.67- 1.00   2015- 2017  1.00- 2.09   2018   2.29  31 ml/min   2019    2.40- 2.60   2020   2.15- 3.47   2021   2.82- 4.80   Mar- sept 2022 3.26- 3.70 18 ml/min , CKD IV   April 2023  3.71  17   Sept 2023  3.46  18   08/15/22  4.00  15 ml/min   08/25/2022  5.11  11   08/31/22   5.43  11 ml/min, CKD V       UA - 100 prot, 0-5 wbc/ rbc    UNa  <10      UOsm 272    UCr 82    ECHO - new low EF 25-30%   Assessment/ Plan: AKI on CKD4 - b/l creatinine 3.7- 4.0 from sept - dec 2023, eGFR 15- 43m/min. Creat here 5.1 on admission in setting of AMS, hyponatremia, etoh w/d. Gentle IVF's w/ sod bicarb started 1/12 and creat improved. IVF"s were dc'd. Creat up to the mid 5s now. Hard to distinguish uremia from etoh withdrawal. Pt alert but memory is sluggish, no asterixis. If MS worsens will need RRT.   Hyponatremia - euvolemic vs possibly hypervolemic (CXR mild changes). Could have beer potomania (low solute). UNa low and UOsm mid-range. Na+ was 114 on presentation. SP 3% saline from 1/11- 11/13. TSH and cortisol wnl. Na+ dropped after 3% stopped. Was  going to try salt tabs and fluid restriction for poss SIADH  but Na+ dropped again so 3% saline was restarted by CCM. Na+ 126 this am. Per pmd.  Vol - euvolemic vs possibly excess vol. IV diuretics given initially but creat bumped and were then held. Diuretics starting back again today per CCM.  Met acidosis - likely due to advanced CKD, improved sp bicarb IVF Hypocalcemia - very low Ca++ level in med 4x, this is new and prob from advanced CKD. Getting IV Ca++ continuous per CCM. Improved serum Ca++ up to 7.2.  HTN - was on 3 BP lowering meds at home. BP's here are stable to high. Getting metoprolol 25 bid.   Etoh abuse - per pmd AMS - multifactorial due to low Na, etoh w/d, uremia HFrEF - echo showing LVEF 25-30% H/o CVA       Rob Shamecka Hocutt 09/02/2022, 7:46 AM  Recent Labs  Lab 08/20/2022 0855 08/31/22 0330 08/31/22 1431 09/01/22 0409 09/01/22 1412 09/01/22 2345  HGB 10.3* 8.7*  --  8.5*  --  10.0*  ALBUMIN 3.1* 2.6*  --   --   --  2.9*  CALCIUM 4.9* 4.6*   < > 6.6* 8.0* 7.2*  PHOS 5.3*  --   --   --   --   --   CREATININE 5.11* 5.43*   < > 4.92* 5.57* 5.79*  K 4.5 3.8   < > 3.4* 4.6 4.5   < > = values in this interval not displayed.    No results for input(s): "IRON", "TIBC", "FERRITIN" in the last 168 hours. Inpatient medications:  amoxicillin-clavulanate  1 tablet Oral BID   Chlorhexidine Gluconate Cloth  6 each Topical Daily   insulin aspart  0-9 Units Subcutaneous Q4H   ipratropium-albuterol  3 mL Nebulization TID   metoprolol tartrate  25 mg Oral BID   sodium chloride  2 g Oral TID WC    heparin 1,850 Units/hr (09/02/22 0100)   sodium chloride (hypertonic) 30 mL/hr at 09/02/22 0600   thiamine (VITAMIN B1) injection Stopped (09/01/22 1019)   docusate sodium, labetalol, LORazepam, polyethylene glycol

## 2022-09-02 NOTE — Progress Notes (Signed)
ANTICOAGULATION CONSULT NOTE - Follow Up Consult  Pharmacy Consult for heparin Indication: atrial fibrillation (PTA xarelto on hold)  Allergies  Allergen Reactions   Hydrochlorothiazide Other (See Comments)    Pt gets hyponatremia   Lasix [Furosemide] Other (See Comments)    Sodium levels drop when take   Nsaids Other (See Comments)    Kidney disease/failure   Sulfa Antibiotics Other (See Comments)    headaches    Patient Measurements: Height: '5\' 9"'$  (175.3 cm) Weight: 72.6 kg (160 lb) IBW/kg (Calculated) : 70.7 Heparin Dosing Weight: 73 kg  Vital Signs: Temp: 97.7 F (36.5 C) (01/14 0014) Temp Source: Oral (01/14 0014) BP: 152/103 (01/14 0000) Pulse Rate: 118 (01/14 0000)  Labs: Recent Labs    09/05/2022 0855 09/14/2022 0855 09/03/2022 1130 09/04/2022 1336 08/31/22 0330 08/31/22 0954 08/31/22 1431 08/31/22 2144 09/01/22 0409 09/01/22 1412 09/01/22 2345  HGB 10.3*  --   --   --  8.7*  --   --   --  8.5*  --   --   HCT 28.3*  --   --   --  24.2*  --   --   --  23.3*  --   --   PLT 185  --   --   --  120*  --   --   --  108*  --   --   APTT  --   --   --  47*  --  35  --   --   --   --   --   LABPROT  --   --   --  40.4* 27.0*  --   --   --   --   --   --   INR  --   --   --  4.2* 2.5*  --   --   --   --   --   --   HEPARINUNFRC  --    < >  --  >1.10*  --  <0.10*   < >  --  <0.10* 0.20* 0.16*  CREATININE 5.11*  --   --   --  5.43*  --    < > 5.62* 4.92* 5.57*  --   TROPONINIHS 36*  --  37*  --   --   --   --   --   --   --   --    < > = values in this interval not displayed.     Estimated Creatinine Clearance: 12.2 mL/min (A) (by C-G formula based on SCr of 5.57 mg/dL (H)).   Medications:  - on xarelto 15 mg daily PTA (last dose taken on 08/28/22 PM)  Assessment: Patient is a 72 y.o M with hx EtOH and  afib on xarelto PTA who presented to the ED on 08/29/2022 for evaluation after he fell out of bed.  Head CT showed no acute intracranial findings but noted laceration in  the left frontotemporal region. Heparin drip ordered on 1/11 but then d/ced on the same day.  INR was noted to be 4.2 on 1/11. INR is likely falsely elevated d/t effect of xarelto and decreased clearance of drug d/t renal insufficiency.  Pharmacy has been consulted on 1/12 to start heparin drip for afib.  Today, 09/02/2022: HL 0.16 subtherapeutic on 1650 units/hr -- Per RN no interruptions and no bleeding  Goal of Therapy:  Heparin level 0.3-0.7 units/ml Monitor platelets by anticoagulation protocol: Yes   Plan:  - heparin  bolus 2000 units x 1 - increase heparin drip to 1850 units/hr - check 8 hr heparin level  - monitor for s/sx bleeding  Dolly Rias RPh 09/02/2022, 12:38 AM

## 2022-09-02 NOTE — Progress Notes (Addendum)
NAME:  Douglas Ballard, MRN:  [id number], DOB:  [birth year], LOS: 3 ADMISSION DATE:  09/19/2022, CONSULTATION DATE:  09/04/2022 REFERRING MD:  Dr. Francia Greaves, CHIEF COMPLAINT:  fall   History of Present Illness:  HPI obtained from EMR as patient is encephalopathic and no family able to be reached.   72 year old male with prior hx as below, significant for Afib on Xarelto, HFpEF, CKD IV, and DMT2 presented from home by EMS after unwitnessed fall.  Patient reported to EMS, fell out of bed due to weakness while trying to go the bathroom, denied LOC, and sustained a small laceration to the left temporal area.  Additionally complained of nausea and vomiting for one month in which he has not had evaluated.  Was alert and oriented initially and denied any neck or back pain.   In ER, patient found in afib with RVR, no temperature available yet, normoxia, and SBP 130-150's.  While in ER patient c/o of neck, back, and ankle pain then became more agitated, altered and tremulous with concern for ETOH withdrawal.  Given ativan '1mg'$  twice.  Additionally found to be in Afib with RVR, placed on cardizem gtt. Labs noted for Na 113, Cl 79, sCr 4.5, BUN/ sCr 64/ 5.11 (prior 54/ 3.26 04/2021), bicarb 15, AG 19, glucose 179, AST/ ALT 712/ 553, t. Bili 2.1, BNP 2970, trop hs 36, WBC 19.8, Hgb 10.3, Hct 28, plts 185, neg ETOH level, EKG afib with incomplete RBBB, rate 125, Qtc 579.  CTH and cervical neg for acute fx or intracranial process. CXR showed cardiomegaly with mild pulmonary venous congestion and possible early right perihilar opacity.  XR left/ right ankle neg, lumbar and pelvis neg for fx.  He has received 1L LR in ER.  PCCM called for admit.  Patient remains less talkative after ativan, remains tremulous, and confused.   Pertinent  Medical History  Former smoker, Afib on Xarelto, ETOH use, HTN, HFpEF, CKD IV, DMT2, anemia of chronic disease, colovesical fistula  Significant Hospital Events: Including procedures,  antibiotic start and stop dates in addition to other pertinent events   1/11 admitted  1/12 sodium level improved on 3% saline, bicarb drip started 1/13 3% saline restarted  Interim History / Subjective:   No complaints this morning Brother at bedside Some confusion overnight  Objective   Blood pressure (!) 148/102, pulse (!) 115, temperature 98.6 F (37 C), temperature source Oral, resp. rate 19, height '5\' 9"'$  (1.753 m), weight 72.6 kg, SpO2 90 %.        Intake/Output Summary (Last 24 hours) at 09/02/2022 0901 Last data filed at 09/02/2022 0600 Gross per 24 hour  Intake 1130.58 ml  Output --  Net 1130.58 ml   Filed Weights   09/13/2022 0831  Weight: 72.6 kg   Examination: General:  ill appearing male, laying in bed, no acute distress HEENT: MM pink/dry, no JVD, AT/[place], PERRL, no scleral icterus, +JVD Neuro:  Alert and oriented x 3, moving all extremities CV: irregularly irregular, no murmur PULM:  clear to auscultation. GI: +bs, soft, NT/ ND Extremities: warm/dry, 1+ edema  Skin: no rashes or lesions  RUQ [place] 09/19/2022 Unremarkable  Echo 08/21/2022 EF 20 to 25%, global hypokinesis RV systolic function mildly reduced, size moderately enlarged. Mildly elevated PA pressure. LA and RA severely dilated.  Renal US 09/01/2022 Within normal limits  Resolved Hospital Problem list   Hypomagnesemia  Assessment & Plan:   Acute metabolic/ toxic encephalopathy ETOH withdrawal - Mental status much improved -  high dose thiamine  - CIWA protocol with PRN ativan - correct metabolic derangements as below   Elevated LFTs Elevated T. Bilirubin Elevated INR In setting of heart failure with hepatic congestion - RUQ [place] normal - tylenol level not elevated - hepatitis panel negqative - monitor INR and LFTs  Hyponatremia Hypocalcemia AKI on CKD stage IV AGMA/ lactic acidosis Hypovolemic, hypo-osmolar - 2g NaCl tabs TID - start sodium bicarb tabs TID - start oral lasix '40mg'$   daily - renal US is normal - Replace electrolytes as indicated - Avoid nephrotoxic agents, ensure adequate renal perfusion  Acute Systolic Heart Failure Afib with RVR HTN - Prior TTE 02/2020> EF 55-60%, G3DD, normal RV, mild MR - New Echo with EF 20-25% - Troponins not significant elevated and no ischemic EKG changes - continue metoprolol '25mg'$  BID - start PO lasix '40mg'$  daily - Continue aggressive electrolyte replacement - TSH within normal limits - goal Mag > 2, K> 4 - Hold Heparin as platelets have dropped - hold holme clonidine, bidil, labetalol for now  Prolonged QTC - maximize electrolytes as above and avoid Qtc prolonging meds - tele  Suspected sepsis, possible aspiration PNA - remains normotensive, goal MAP> 65 - blood cultures with staph epi, likely contaminate - will complete course of antibiotics with augmentin - monitor fever curve/ WBC   DM with hyperglycemia - SSI sensitive with CBG q4  Anemia of chronic disease Thrombocytopenia - H/H at baseline around 10 - trend CBC/ transfuse for Hgb < 7 or active bleeding  - hold heparin, HIT score of 2 (low probability for HIT) - likely secondary to acute liver stress  Left temporal scalp laceration s/p staple 2/2 fall  - staple removal in 7-10 days   Best Practice (right click and "Reselect all SmartList Selections" daily)   Diet/type: Regular consistency (see orders) DVT prophylaxis: systemic heparin GI prophylaxis: PPI Lines: N/A Foley:  N/A Code Status:  full code Last date of multidisciplinary goals of care discussion [pending]  Labs   CBC: Recent Labs  Lab 09/12/2022 0855 08/31/22 0330 09/01/22 0409 09/01/22 2345  WBC 19.8* 15.4* 12.2* 15.4*  NEUTROABS 17.0*  --   --   --   HGB 10.3* 8.7* 8.5* 10.0*  HCT 28.3* 24.2* 23.3* 28.2*  MCV 87.3 89.0 86.0 89.2  PLT 185 120* 108* 85*    Basic Metabolic Panel: Recent Labs  Lab 09/16/2022 0855 09/10/2022 1222 09/12/2022 1754 08/29/2022 2258 08/31/22 0330  08/31/22 0753 08/31/22 1431 08/31/22 2144 09/01/22 0409 09/01/22 1412 09/01/22 1829 09/01/22 2044 09/01/22 2345  NA 113*   < > 115*   < > 119*   < > 125* 123* 122* 121* 125* 126* 125*  125*  K 4.5  --   --   --  3.8  --  3.8 4.1 3.4* 4.6  --   --  4.5  CL 79*  --   --   --  85*  --  91* 89* 83* 85*  --   --  92*  CO2 15*  --   --   --  14*  --  10* 13* 22 15*  --   --  13*  GLUCOSE 179*  --   --   --  72  --  84 176* 120* 150*  --   --  128*  BUN 64*  --   --   --  71*  --  78* 79* 75* 78*  --   --  86*  CREATININE  5.11*  --   --   --  5.43*  --  5.52* 5.62* 4.92* 5.57*  --   --  5.79*  CALCIUM 4.9*  --   --   --  4.6*  --  5.2* 6.2* 6.6* 8.0*  --   --  7.2*  MG 0.7*  --  1.4*  --  2.1  --   --   --   --   --   --   --   --   PHOS 5.3*  --   --   --   --   --   --   --   --   --   --   --   --    < > = values in this interval not displayed.   GFR: Estimated Creatinine Clearance: 11.7 mL/min (A) (by C-G formula based on SCr of 5.79 mg/dL (H)). Recent Labs  Lab 08/29/2022 0855 09/13/2022 1135 08/27/2022 1222 09/16/2022 1648 08/31/22 0330 09/01/22 0409 09/01/22 2345  PROCALCITON  --   --  0.39  --   --   --   --   WBC 19.8*  --   --   --  15.4* 12.2* 15.4*  LATICACIDVEN  --  2.2*  --  1.7  --   --   --     Liver Function Tests: Recent Labs  Lab 09/03/2022 0855 08/31/22 0330 09/01/22 2345  AST 712* 1,370* 718*  ALT 553* 856* 914*  ALKPHOS 75 69 128*  BILITOT 2.1* 1.4* 2.4*  PROT 6.9 5.8* 6.4*  ALBUMIN 3.1* 2.6* 2.9*   Recent Labs  Lab 08/25/2022 1222  LIPASE 49   Recent Labs  Lab 09/06/2022 1131  AMMONIA 26    ABG    Component Value Date/Time   HCO3 18.9 (L) 03/14/2020 0636   TCO2 23 06/26/2011 0946   ACIDBASEDEF 6.0 (H) 03/14/2020 0636   O2SAT 33.7 03/14/2020 0636     Coagulation Profile: Recent Labs  Lab 09/16/2022 1336 08/31/22 0330  INR 4.2* 2.5*    Cardiac Enzymes: No results for input(s): "CKTOTAL", "CKMB", "CKMBINDEX", "TROPONINI" in the last 168  hours.  HbA1C: Hgb A1c MFr Bld  Date/Time Value Ref Range Status  05/16/2021 11:47 AM 6.8 (H) 4.6 - 6.5 % Final    Comment:    Glycemic Control Guidelines for People with Diabetes:Non Diabetic:  <6%Goal of Therapy: <7%Additional Action Suggested:  >8%   03/14/2020 10:16 AM 5.9 (H) 4.8 - 5.6 % Final    Comment:    (NOTE) Pre diabetes:          5.7%-6.4%  Diabetes:              >6.4%  Glycemic control for   <7.0% adults with diabetes     CBG: Recent Labs  Lab 09/01/22 1622 09/01/22 2016 09/01/22 2335 09/02/22 0326 09/02/22 0758  GLUCAP 161* 138* 132* 138* 128*    Critical care time: 32 mins     Freda Jackson, MD [facility] Office: [phone]   See Amion for personal pager PCCM on call pager [phone] until 7pm. Please call Elink 7p-7a. [phone]

## 2022-09-02 NOTE — Progress Notes (Signed)
ANTICOAGULATION CONSULT NOTE - Follow Up Consult  Pharmacy Consult for heparin Indication: atrial fibrillation (PTA xarelto on hold)  Allergies  Allergen Reactions   Hydrochlorothiazide Other (See Comments)    Pt gets hyponatremia   Lasix [Furosemide] Other (See Comments)    Sodium levels drop when take   Nsaids Other (See Comments)    Kidney disease/failure   Sulfa Antibiotics Other (See Comments)    headaches    Patient Measurements: Height: '5\' 9"'$  (175.3 cm) Weight: 72.6 kg (160 lb) IBW/kg (Calculated) : 70.7 Heparin Dosing Weight: 73 kg  Vital Signs: Temp: 98.6 F (37 C) (01/14 0820) Temp Source: Oral (01/14 0820) BP: 148/102 (01/14 0500) Pulse Rate: 115 (01/14 0811)  Labs: Recent Labs    09/05/2022 1130 08/21/2022 1336 09/01/2022 1336 08/31/22 0330 08/31/22 0954 08/31/22 1431 09/01/22 0409 09/01/22 1412 09/01/22 2345  HGB  --   --    < > 8.7*  --   --  8.5*  --  10.0*  HCT  --   --   --  24.2*  --   --  23.3*  --  28.2*  PLT  --   --   --  120*  --   --  108*  --  85*  APTT  --  47*  --   --  35  --   --   --   --   LABPROT  --  40.4*  --  27.0*  --   --   --   --   --   INR  --  4.2*  --  2.5*  --   --   --   --   --   HEPARINUNFRC  --  >1.10*   < >  --  <0.10*   < > <0.10* 0.20* 0.16*  CREATININE  --   --   --  5.43*  --    < > 4.92* 5.57* 5.79*  TROPONINIHS 37*  --   --   --   --   --   --   --   --    < > = values in this interval not displayed.     Estimated Creatinine Clearance: 11.7 mL/min (A) (by C-G formula based on SCr of 5.79 mg/dL (H)).   Medications:  - on xarelto 15 mg daily PTA (last dose taken on 08/28/22 PM)  Assessment: Patient is a 72 y.o M with hx EtOH and  afib on xarelto PTA who presented to the ED on 09/04/2022 for evaluation after he fell out of bed.  Head CT showed no acute intracranial findings but noted laceration in the left frontotemporal region. Heparin drip ordered on 1/11 but then d/ced on the same day.  INR was noted to be 4.2 on  1/11. INR is likely falsely elevated d/t effect of xarelto and decreased clearance of drug d/t renal insufficiency.  Pharmacy has been consulted on 1/12 to start heparin drip for afib.  Today, 09/02/2022: - heparin level collected at 0841 is undetectable <0.10, but RN reported that heparin drip was off from 0720 to 0840 d/t issues with IV line - heparin drip off again at ~noon d/t IV access - plts down 66K , hgb 9.4 - scr up 5.95 (crcl~11)  Goal of Therapy:  Heparin level 0.3-0.7 units/ml Monitor platelets by anticoagulation protocol: Yes   Plan:  - Per Dr. [name], hold heparin drip for now d/t low plts -Pharmacy will sign off for heparin. If to resume  AC, please enter new pharmacy consult   Lynelle Doctor 09/02/2022,9:13 AM  _________________________________

## 2022-09-03 ENCOUNTER — Inpatient Hospital Stay (HOSPITAL_COMMUNITY): Payer: Medicare HMO

## 2022-09-03 DIAGNOSIS — N179 Acute kidney failure, unspecified: Secondary | ICD-10-CM | POA: Diagnosis not present

## 2022-09-03 DIAGNOSIS — I5021 Acute systolic (congestive) heart failure: Secondary | ICD-10-CM | POA: Diagnosis not present

## 2022-09-03 DIAGNOSIS — A419 Sepsis, unspecified organism: Secondary | ICD-10-CM | POA: Diagnosis not present

## 2022-09-03 DIAGNOSIS — I4891 Unspecified atrial fibrillation: Secondary | ICD-10-CM | POA: Diagnosis not present

## 2022-09-03 DIAGNOSIS — E871 Hypo-osmolality and hyponatremia: Secondary | ICD-10-CM | POA: Diagnosis not present

## 2022-09-03 LAB — BLOOD GAS, VENOUS
Acid-base deficit: 13.4 mmol/L — ABNORMAL HIGH (ref 0.0–2.0)
Bicarbonate: 12.3 mmol/L — ABNORMAL LOW (ref 20.0–28.0)
O2 Saturation: 71.5 %
Patient temperature: 37
pCO2, Ven: 28 mmHg — ABNORMAL LOW (ref 44–60)
pH, Ven: 7.25 (ref 7.25–7.43)
pO2, Ven: 46 mmHg — ABNORMAL HIGH (ref 32–45)

## 2022-09-03 LAB — PROTIME-INR
INR: 6.8 (ref 0.8–1.2)
Prothrombin Time: 58.2 seconds — ABNORMAL HIGH (ref 11.4–15.2)

## 2022-09-03 LAB — CBC
HCT: 24.1 % — ABNORMAL LOW (ref 39.0–52.0)
HCT: 23.8 % — ABNORMAL LOW (ref 39.0–52.0)
Hemoglobin: 8.3 g/dL — ABNORMAL LOW (ref 13.0–17.0)
Hemoglobin: 8 g/dL — ABNORMAL LOW (ref 13.0–17.0)
MCH: 31.8 pg (ref 26.0–34.0)
MCH: 31.9 pg (ref 26.0–34.0)
MCHC: 34.4 g/dL (ref 30.0–36.0)
MCHC: 33.6 g/dL (ref 30.0–36.0)
MCV: 92.3 fL (ref 80.0–100.0)
MCV: 94.8 fL (ref 80.0–100.0)
Platelets: 54 10*3/uL — ABNORMAL LOW (ref 150–400)
Platelets: 61 10*3/uL — ABNORMAL LOW (ref 150–400)
RBC: 2.61 MIL/uL — ABNORMAL LOW (ref 4.22–5.81)
RBC: 2.51 MIL/uL — ABNORMAL LOW (ref 4.22–5.81)
RDW: 12.7 % (ref 11.5–15.5)
RDW: 13.1 % (ref 11.5–15.5)
WBC: 14.5 10*3/uL — ABNORMAL HIGH (ref 4.0–10.5)
WBC: 13.9 10*3/uL — ABNORMAL HIGH (ref 4.0–10.5)
nRBC: 1 % — ABNORMAL HIGH (ref 0.0–0.2)
nRBC: 1.5 % — ABNORMAL HIGH (ref 0.0–0.2)

## 2022-09-03 LAB — GLUCOSE, CAPILLARY
Glucose-Capillary: 106 mg/dL — ABNORMAL HIGH (ref 70–99)
Glucose-Capillary: 121 mg/dL — ABNORMAL HIGH (ref 70–99)
Glucose-Capillary: 146 mg/dL — ABNORMAL HIGH (ref 70–99)
Glucose-Capillary: 170 mg/dL — ABNORMAL HIGH (ref 70–99)
Glucose-Capillary: 165 mg/dL — ABNORMAL HIGH (ref 70–99)

## 2022-09-03 LAB — HEPATIC FUNCTION PANEL
ALT: 1576 U/L — ABNORMAL HIGH (ref 0–44)
AST: 2030 U/L — ABNORMAL HIGH (ref 15–41)
Albumin: 2.6 g/dL — ABNORMAL LOW (ref 3.5–5.0)
Alkaline Phosphatase: 113 U/L (ref 38–126)
Bilirubin, Direct: 2 mg/dL — ABNORMAL HIGH (ref 0.0–0.2)
Indirect Bilirubin: 1.4 mg/dL — ABNORMAL HIGH (ref 0.3–0.9)
Total Bilirubin: 3.4 mg/dL — ABNORMAL HIGH (ref 0.3–1.2)
Total Protein: 5.5 g/dL — ABNORMAL LOW (ref 6.5–8.1)

## 2022-09-03 LAB — DIFFERENTIAL
Abs Immature Granulocytes: 0.24 10*3/uL — ABNORMAL HIGH (ref 0.00–0.07)
Basophils Absolute: 0 10*3/uL (ref 0.0–0.1)
Basophils Relative: 0 %
Eosinophils Absolute: 0 10*3/uL (ref 0.0–0.5)
Eosinophils Relative: 0 %
Immature Granulocytes: 2 %
Lymphocytes Relative: 2 %
Lymphs Abs: 0.3 10*3/uL — ABNORMAL LOW (ref 0.7–4.0)
Monocytes Absolute: 1.6 10*3/uL — ABNORMAL HIGH (ref 0.1–1.0)
Monocytes Relative: 12 %
Neutro Abs: 11.6 10*3/uL — ABNORMAL HIGH (ref 1.7–7.7)
Neutrophils Relative %: 84 %

## 2022-09-03 LAB — RENAL FUNCTION PANEL
Albumin: 2.5 g/dL — ABNORMAL LOW (ref 3.5–5.0)
Albumin: 2.5 g/dL — ABNORMAL LOW (ref 3.5–5.0)
Anion gap: 26 — ABNORMAL HIGH (ref 5–15)
Anion gap: 28 — ABNORMAL HIGH (ref 5–15)
BUN: 99 mg/dL — ABNORMAL HIGH (ref 8–23)
BUN: 78 mg/dL — ABNORMAL HIGH (ref 8–23)
CO2: 10 mmol/L — ABNORMAL LOW (ref 22–32)
CO2: 9 mmol/L — ABNORMAL LOW (ref 22–32)
Calcium: 6.4 mg/dL — CL (ref 8.9–10.3)
Calcium: 6.1 mg/dL — CL (ref 8.9–10.3)
Chloride: 96 mmol/L — ABNORMAL LOW (ref 98–111)
Chloride: 98 mmol/L (ref 98–111)
Creatinine, Ser: 6.38 mg/dL — ABNORMAL HIGH (ref 0.61–1.24)
Creatinine, Ser: 5.63 mg/dL — ABNORMAL HIGH (ref 0.61–1.24)
GFR, Estimated: 9 mL/min — ABNORMAL LOW (ref >60)
GFR, Estimated: 10 mL/min — ABNORMAL LOW (ref >60)
Glucose, Bld: 125 mg/dL — ABNORMAL HIGH (ref 70–99)
Glucose, Bld: 185 mg/dL — ABNORMAL HIGH (ref 70–99)
Phosphorus: 9.6 mg/dL — ABNORMAL HIGH (ref 2.5–4.6)
Phosphorus: 8.7 mg/dL — ABNORMAL HIGH (ref 2.5–4.6)
Potassium: 5.9 mmol/L — ABNORMAL HIGH (ref 3.5–5.1)
Potassium: 5.5 mmol/L — ABNORMAL HIGH (ref 3.5–5.1)
Sodium: 132 mmol/L — ABNORMAL LOW (ref 135–145)
Sodium: 135 mmol/L (ref 135–145)

## 2022-09-03 LAB — RESPIRATORY PANEL BY PCR

## 2022-09-03 LAB — SODIUM
Sodium: 134 mmol/L — ABNORMAL LOW (ref 135–145)
Sodium: 131 mmol/L — ABNORMAL LOW (ref 135–145)

## 2022-09-03 LAB — DIC (DISSEMINATED INTRAVASCULAR COAGULATION)PANEL
D-Dimer, Quant: 7.44 ug/mL-FEU — ABNORMAL HIGH (ref 0.00–0.50)
Fibrinogen: 200 mg/dL — ABNORMAL LOW (ref 210–475)
INR: 6.8 (ref 0.8–1.2)
Platelets: 60 10*3/uL — ABNORMAL LOW (ref 150–400)
Prothrombin Time: 58.2 seconds — ABNORMAL HIGH (ref 11.4–15.2)
Smear Review: NONE SEEN
aPTT: 49 seconds — ABNORMAL HIGH (ref 24–36)

## 2022-09-03 LAB — LACTIC ACID, PLASMA
Lactic Acid, Venous: >9 mmol/L (ref 0.5–1.9)
Lactic Acid, Venous: 5.7 mmol/L (ref 0.5–1.9)

## 2022-09-03 LAB — TECHNOLOGIST SMEAR REVIEW

## 2022-09-03 LAB — AMMONIA: Ammonia: 89 umol/L — ABNORMAL HIGH (ref 9–35)

## 2022-09-03 LAB — CORTISOL: Cortisol, Plasma: >100 ug/dL

## 2022-09-03 LAB — PROCALCITONIN: Procalcitonin: 0.9 ng/mL

## 2022-09-03 LAB — LIPASE, BLOOD: Lipase: 81 U/L — ABNORMAL HIGH (ref 11–51)

## 2022-09-03 LAB — PREPARE RBC (CROSSMATCH)

## 2022-09-03 MED ORDER — STERILE WATER FOR INJECTION IV SOLN
INTRAVENOUS | Status: DC
  Filled 2022-09-03 (×2): qty 1000

## 2022-09-03 MED ORDER — SODIUM CHLORIDE 0.9 % IV SOLN
2.0000 g | Freq: Two times a day (BID) | INTRAVENOUS | Status: DC
  Administered 2022-09-03 – 2022-09-05 (×5): 2 g via INTRAVENOUS
  Filled 2022-09-03 (×5): qty 12.5

## 2022-09-03 MED ORDER — SODIUM CHLORIDE 0.9% IV SOLUTION: Freq: Once | INTRAVENOUS | Status: DC

## 2022-09-03 MED ORDER — ANTICOAGULANT SODIUM CITRATE 4% (200MG/5ML) IV SOLN
5.0000 mL | Status: DC | PRN
  Administered 2022-09-05 (×2): 5 mL
  Filled 2022-09-03 (×2): qty 5

## 2022-09-03 MED ORDER — SODIUM CHLORIDE 0.9% IV SOLUTION: Freq: Once | INTRAVENOUS | Status: AC

## 2022-09-03 MED ORDER — MIDAZOLAM HCL 2 MG/2ML IJ SOLN
2.0000 mg | Freq: Once | INTRAMUSCULAR | Status: AC | PRN
  Administered 2022-09-03: 2 mg via INTRAVENOUS
  Filled 2022-09-03: qty 2

## 2022-09-03 MED ORDER — NOREPINEPHRINE 4 MG/250ML-% IV SOLN
0.0000 ug/min | INTRAVENOUS | Status: DC
  Administered 2022-09-03: 22 ug/min via INTRAVENOUS
  Administered 2022-09-03: 5 ug/min via INTRAVENOUS
  Administered 2022-09-04 (×2): 1 ug/min via INTRAVENOUS
  Filled 2022-09-03 (×2): qty 250

## 2022-09-03 MED ORDER — VASOPRESSIN 20 UNITS/100 ML INFUSION FOR SHOCK
0.0000 [IU]/min | INTRAVENOUS | Status: DC
  Administered 2022-09-03: 0.03 [IU]/min via INTRAVENOUS

## 2022-09-03 MED ORDER — SODIUM CHLORIDE 0.9 % FOR CRRT: INTRAVENOUS_CENTRAL | Status: DC | PRN

## 2022-09-03 MED ORDER — MIDAZOLAM HCL 2 MG/2ML IJ SOLN
INTRAMUSCULAR | Status: AC
  Administered 2022-09-03: 2 mg via INTRAVENOUS
  Filled 2022-09-03: qty 2

## 2022-09-03 MED ORDER — VASOPRESSIN 20 UNITS/100 ML INFUSION FOR SHOCK
INTRAVENOUS | Status: AC
  Filled 2022-09-03: qty 100

## 2022-09-03 MED ORDER — MIDAZOLAM HCL 2 MG/2ML IJ SOLN
2.0000 mg | Freq: Once | INTRAMUSCULAR | Status: AC
  Administered 2022-09-03: 2 mg via INTRAVENOUS

## 2022-09-03 MED ORDER — MIDAZOLAM HCL 2 MG/2ML IJ SOLN
INTRAMUSCULAR | Status: AC
  Filled 2022-09-03: qty 2

## 2022-09-03 MED ORDER — STERILE WATER FOR INJECTION IV SOLN
INTRAVENOUS | Status: DC
  Filled 2022-09-03: qty 150

## 2022-09-03 MED ORDER — CALCIUM GLUCONATE-NACL 2-0.675 GM/100ML-% IV SOLN
2.0000 g | Freq: Once | INTRAVENOUS | Status: AC
  Administered 2022-09-03: 2000 mg via INTRAVENOUS
  Filled 2022-09-03: qty 100

## 2022-09-03 MED ORDER — ANTICOAGULANT SODIUM CITRATE 4% (200MG/5ML) IV SOLN
5.0000 mL | Freq: Once | Status: AC
  Administered 2022-09-03: 5 mL
  Filled 2022-09-03: qty 5

## 2022-09-03 MED ORDER — NOREPINEPHRINE 4 MG/250ML-% IV SOLN
INTRAVENOUS | Status: AC
  Filled 2022-09-03: qty 250

## 2022-09-03 MED ORDER — PRISMASOL BGK 0/2.5 32-2.5 MEQ/L EC SOLN
Status: DC
  Filled 2022-09-03 (×3): qty 5000

## 2022-09-03 MED ORDER — SODIUM ZIRCONIUM CYCLOSILICATE 10 G PO PACK
10.0000 g | PACK | Freq: Once | ORAL | Status: DC
  Filled 2022-09-03: qty 1

## 2022-09-03 MED ORDER — DEXMEDETOMIDINE HCL IN NACL 400 MCG/100ML IV SOLN
0.0000 ug/kg/h | INTRAVENOUS | Status: DC
  Administered 2022-09-03: 0.6 ug/kg/h via INTRAVENOUS
  Administered 2022-09-04: 0.3 ug/kg/h via INTRAVENOUS
  Administered 2022-09-04: 0.6 ug/kg/h via INTRAVENOUS
  Administered 2022-09-05: 0.3 ug/kg/h via INTRAVENOUS
  Filled 2022-09-03 (×4): qty 100

## 2022-09-03 MED ORDER — METRONIDAZOLE 500 MG/100ML IV SOLN
500.0000 mg | Freq: Two times a day (BID) | INTRAVENOUS | Status: DC
  Administered 2022-09-03 – 2022-09-05 (×6): 500 mg via INTRAVENOUS
  Filled 2022-09-03 (×6): qty 100

## 2022-09-03 MED ORDER — SODIUM BICARBONATE 8.4 % IV SOLN
INTRAVENOUS | Status: AC
  Filled 2022-09-03: qty 50

## 2022-09-03 MED ORDER — SODIUM CHLORIDE 0.9 % IV SOLN
20.0000 ug | Freq: Once | INTRAVENOUS | Status: AC
  Administered 2022-09-03: 20 ug via INTRAVENOUS
  Filled 2022-09-03: qty 5

## 2022-09-03 MED ORDER — MIDAZOLAM HCL 2 MG/2ML IJ SOLN: 2.0000 mg | Freq: Once | INTRAMUSCULAR | Status: AC

## 2022-09-03 MED ORDER — SODIUM CHLORIDE 0.9 % IV SOLN
1.0000 g | INTRAVENOUS | Status: DC
  Administered 2022-09-03: 1 g via INTRAVENOUS
  Filled 2022-09-03: qty 10

## 2022-09-03 MED ORDER — ORAL CARE MOUTH RINSE: 15.0000 mL | OROMUCOSAL | Status: DC | PRN

## 2022-09-03 MED ORDER — SODIUM CHLORIDE 0.9 % IV SOLN
500.0000 mg | INTRAVENOUS | Status: DC
  Administered 2022-09-03 – 2022-09-04 (×2): 500 mg via INTRAVENOUS
  Filled 2022-09-03 (×3): qty 5

## 2022-09-03 MED ORDER — SODIUM BICARBONATE 8.4 % IV SOLN
50.0000 meq | Freq: Once | INTRAVENOUS | Status: AC
  Administered 2022-09-03: 50 meq via INTRAVENOUS

## 2022-09-03 MED ORDER — DEXMEDETOMIDINE HCL IN NACL 200 MCG/50ML IV SOLN
0.0000 ug/kg/h | INTRAVENOUS | Status: DC
  Administered 2022-09-03 (×2): 0.4 ug/kg/h via INTRAVENOUS
  Filled 2022-09-03 (×2): qty 50

## 2022-09-03 MED ORDER — PANTOPRAZOLE SODIUM 40 MG IV SOLR
40.0000 mg | Freq: Two times a day (BID) | INTRAVENOUS | Status: DC
  Administered 2022-09-03 – 2022-09-09 (×13): 40 mg via INTRAVENOUS
  Filled 2022-09-03 (×13): qty 10

## 2022-09-03 MED ORDER — PRISMASOL BGK 0/2.5 32-2.5 MEQ/L EC SOLN
Status: DC
  Filled 2022-09-03 (×11): qty 5000

## 2022-09-03 NOTE — Progress Notes (Signed)
[facility] Progress Note Patient Name: Douglas Ballard DOB: 04/28/1951 MRN: [id number]   Date of Service  09/03/2022  HPI/Events of Note  BSRN concerned about patient increased agitation/ delirium on top of nausea. Pt admitted s/p fall, just had heparin stopped today at 1400. Questioning if a bleed is possible.  Initial head CT negative Platelets 66 Previously on apixaban  eICU Interventions  A repeat head CT reasonable to rule out bleed Stat head CT ordered     Intervention Category Intermediate Interventions: Change in mental status - evaluation and management  Judd Lien 09/03/2022, 12:18 AM

## 2022-09-03 NOTE — Progress Notes (Signed)
NAME:  Douglas Ballard, MRN:  [id number], DOB:  [birth year], LOS: 4 ADMISSION DATE:  08/24/2022, CONSULTATION DATE:  09/11/2022 REFERRING MD:  Dr. Francia Greaves, CHIEF COMPLAINT:  fall   History of Present Illness:  HPI obtained from EMR as patient is encephalopathic and no family able to be reached.   72 year old male with prior hx as below, significant for Afib on Xarelto, HFpEF, CKD IV, and DMT2 presented from home by EMS after unwitnessed fall.  Patient reported to EMS, fell out of bed due to weakness while trying to go the bathroom, denied LOC, and sustained a small laceration to the left temporal area.  Additionally complained of nausea and vomiting for one month in which he has not had evaluated.  Was alert and oriented initially and denied any neck or back pain.   In ER, patient found in afib with RVR, no temperature available yet, normoxia, and SBP 130-150's.  While in ER patient c/o of neck, back, and ankle pain then became more agitated, altered and tremulous with concern for ETOH withdrawal.  Given ativan '1mg'$  twice.  Additionally found to be in Afib with RVR, placed on cardizem gtt. Labs noted for Na 113, Cl 79, sCr 4.5, BUN/ sCr 64/ 5.11 (prior 54/ 3.26 04/2021), bicarb 15, AG 19, glucose 179, AST/ ALT 712/ 553, t. Bili 2.1, BNP 2970, trop hs 36, WBC 19.8, Hgb 10.3, Hct 28, plts 185, neg ETOH level, EKG afib with incomplete RBBB, rate 125, Qtc 579.  CTH and cervical neg for acute fx or intracranial process. CXR showed cardiomegaly with mild pulmonary venous congestion and possible early right perihilar opacity.  XR left/ right ankle neg, lumbar and pelvis neg for fx.  He has received 1L LR in ER.  PCCM called for admit.  Patient remains less talkative after ativan, remains tremulous, and confused.   Pertinent  Medical History  Former smoker, Afib on Xarelto, ETOH use, HTN, HFpEF, CKD IV, DMT2, anemia of chronic disease, colovesical fistula  Significant Hospital Events: Including procedures,  antibiotic start and stop dates in addition to other pertinent events   1/11 admitted  1/12 sodium level improved on 3% saline, bicarb drip started 1/13 3% saline restarted  Interim History / Subjective:   Patient more encephalopathic overnight, CT head negative Brother at bedside  Objective   Blood pressure 139/85, pulse (!) 109, temperature 97.7 F (36.5 C), temperature source Axillary, resp. rate (!) 26, height '5\' 9"'$  (1.753 m), weight 72.6 kg, SpO2 (!) 89 %.        Intake/Output Summary (Last 24 hours) at 09/03/2022 0750 Last data filed at 09/03/2022 0100 Gross per 24 hour  Intake 1666.4 ml  Output 550 ml  Net 1116.4 ml   Filed Weights   08/21/2022 0831  Weight: 72.6 kg   Examination: General:  ill appearing male, mildly agitated, laying in bed HEENT: MM pink/dry, no JVD, AT/[place], PERRL, no scleral icterus, +JVD Neuro:  no alert or oriented, moving all extremities spontaneously CV: irregularly irregular, no murmur PULM:  course breath sounds GI: +bs, distended, tender Extremities: warm/dry, 1+ edema  Skin: no rashes or lesions  RUQ [place] 08/21/2022 Unremarkable  Echo 09/02/2022 EF 20 to 25%, global hypokinesis RV systolic function mildly reduced, size moderately enlarged. Mildly elevated PA pressure. LA and RA severely dilated.  Renal US 09/14/2022 Within normal limits  CT Abdomen 09/03/22 Distended fluid filled stomach  Resolved Hospital Problem list   Hypomagnesemia  Assessment & Plan:   Acute metabolic/ toxic  encephalopathy ETOH withdrawal ?Uremia - Mental worsened overnight - CT Head 1/14 negative - high dose thiamine  - CIWA protocol with PRN ativan - precedex as needed - soft wrist restraints   Acute Hypoxemic Respiratory Failure CAP vs aspiration PNA Bilateral Pleural Effusions - continue supplemental O2 for SpO2 92% or greater - ceftriaxone and azithromycin for antibiotics  - MRSA screen negative 09/12/2022 - aspiration precautions  AKI on CKD stage  IV Hyperkalemia Hyponatremia Hypocalcemia AGMA/ lactic acidosis - Cr continues to worsen, severe metabolic acidosis and UOP decreased - Plan to start CRRT, CVL placed this AM - renal US is normal - Avoid nephrotoxic agents, ensure adequate renal perfusion - Nephrology following  Acute Systolic Heart Failure Afib with RVR HTN - Prior TTE 02/2020> EF 55-60%, G3DD, normal RV, mild MR - New Echo with EF 20-25% - Cardiology following, no changes recommended - Troponins not significant elevated and no ischemic EKG changes - continue metoprolol '25mg'$  BID - Continue aggressive electrolyte replacement - TSH within normal limits - goal Mag > 2, K> 4 - Hold Heparin as platelets have dropped - hold holme clonidine, bidil, labetalol for now  Anemia of chronic disease Thrombocytopenia - H/H at baseline around 10 - trend CBC/ transfuse for Hgb < 7 or active bleeding  - hold heparin, HIT score of 2 (low probability for HIT) - Check HIT antibody and DIC panel  Abdominal Pain/Distension Elevated LFTs Elevated T. Bilirubin Elevated INR - RUQ [place] normal - CT Abdomen with gastric distension  - NG tube to be placed to low intermittent suction - tylenol level not elevated - hepatitis panel negative - monitor INR and LFTs - check DIC panel  Prolonged QTC - maximize electrolytes as above and avoid Qtc prolonging meds - tele  Suspected sepsis, possible aspiration PNA - remains normotensive, goal MAP> 65 - blood cultures with staph epi, likely contaminate - will complete course of antibiotics with augmentin - monitor fever curve/ WBC   DM with hyperglycemia - SSI sensitive with CBG q4  Left temporal scalp laceration s/p staple 2/2 fall  - staple removal in 7-10 days   Best Practice (right click and "Reselect all SmartList Selections" daily)   Diet/type: Regular consistency (see orders) DVT prophylaxis: systemic heparin GI prophylaxis: PPI Lines: N/A Foley:  N/A Code Status:  full  code Last date of multidisciplinary goals of care discussion [pending]  Labs   CBC: Recent Labs  Lab 08/20/2022 0855 08/31/22 0330 09/01/22 0409 09/01/22 2345 09/02/22 1244 09/03/22 0525  WBC 19.8* 15.4* 12.2* 15.4* 16.7* 14.5*  NEUTROABS 17.0*  --   --   --   --   --   HGB 10.3* 8.7* 8.5* 10.0* 9.4* 8.3*  HCT 28.3* 24.2* 23.3* 28.2* 26.4* 24.1*  MCV 87.3 89.0 86.0 89.2 89.8 92.3  PLT 185 120* 108* 85* 66* 54*    Basic Metabolic Panel: Recent Labs  Lab 08/29/2022 0855 09/12/2022 1222 08/27/2022 1754 09/03/2022 2258 08/31/22 0330 08/31/22 0753 09/01/22 1412 09/01/22 1829 09/01/22 2345 09/02/22 0841 09/02/22 1244 09/02/22 1835 09/02/22 2037 09/03/22 0232 09/03/22 0525 09/03/22 0529  NA 113*   < > 115*   < > 119*   < > 121*   < > 125*  125* 126*   < > 128* 133* 134* 131* 132*  K 4.5  --   --   --  3.8   < > 4.6  --  4.5 5.1  --  4.8  --   --   --  5.9*  CL 79*  --   --   --  85*   < > 85*  --  92* 92*  --  95*  --   --   --  96*  CO2 15*  --   --   --  14*   < > 15*  --  13* 11*  --  11*  --   --   --  10*  GLUCOSE 179*  --   --   --  72   < > 150*  --  128* 120*  --  120*  --   --   --  125*  BUN 64*  --   --   --  71*   < > 78*  --  86* 85*  --  91*  --   --   --  99*  CREATININE 5.11*  --   --   --  5.43*   < > 5.57*  --  5.79* 5.95*  --  6.14*  --   --   --  6.38*  CALCIUM 4.9*  --   --   --  4.6*   < > 8.0*  --  7.2* 6.8*  --  6.6*  --   --   --  6.4*  MG 0.7*  --  1.4*  --  2.1  --   --   --   --   --   --  2.0  --   --   --   --   PHOS 5.3*  --   --   --   --   --   --   --   --  7.4*  --   --   --   --   --  9.6*   < > = values in this interval not displayed.   GFR: Estimated Creatinine Clearance: 10.6 mL/min (A) (by C-G formula based on SCr of 6.38 mg/dL (H)). Recent Labs  Lab 08/22/2022 1135 09/19/2022 1222 09/03/2022 1648 08/31/22 0330 09/01/22 0409 09/01/22 2345 09/02/22 1244 09/03/22 0525  PROCALCITON  --  0.39  --   --   --   --   --   --   WBC  --   --    --    < > 12.2* 15.4* 16.7* 14.5*  LATICACIDVEN 2.2*  --  1.7  --   --   --   --   --    < > = values in this interval not displayed.    Liver Function Tests: Recent Labs  Lab 08/24/2022 0855 08/31/22 0330 09/01/22 2345 09/02/22 0841 09/03/22 0529  AST 712* 1,370* 718*  --   --   ALT 553* 856* 914*  --   --   ALKPHOS 75 69 128*  --   --   BILITOT 2.1* 1.4* 2.4*  --   --   PROT 6.9 5.8* 6.4*  --   --   ALBUMIN 3.1* 2.6* 2.9* 2.6* 2.5*   Recent Labs  Lab 09/02/2022 1222  LIPASE 49   Recent Labs  Lab 08/29/2022 1131  AMMONIA 26    ABG    Component Value Date/Time   HCO3 18.9 (L) 03/14/2020 0636   TCO2 23 06/26/2011 0946   ACIDBASEDEF 6.0 (H) 03/14/2020 0636   O2SAT 33.7 03/14/2020 0636     Coagulation Profile: Recent Labs  Lab 09/08/2022 1336 08/31/22 0330  INR 4.2* 2.5*    Cardiac Enzymes: No  results for input(s): "CKTOTAL", "CKMB", "CKMBINDEX", "TROPONINI" in the last 168 hours.  HbA1C: Hgb A1c MFr Bld  Date/Time Value Ref Range Status  05/16/2021 11:47 AM 6.8 (H) 4.6 - 6.5 % Final    Comment:    Glycemic Control Guidelines for People with Diabetes:Non Diabetic:  <6%Goal of Therapy: <7%Additional Action Suggested:  >8%   03/14/2020 10:16 AM 5.9 (H) 4.8 - 5.6 % Final    Comment:    (NOTE) Pre diabetes:          5.7%-6.4%  Diabetes:              >6.4%  Glycemic control for   <7.0% adults with diabetes     CBG: Recent Labs  Lab 09/02/22 1130 09/02/22 1658 09/02/22 1938 09/02/22 2336 09/03/22 0319  GLUCAP 122* 133* 106* 114* 106*    Critical care time: 80 mins     Freda Jackson, MD [facility] Office: [phone]   See Amion for personal pager PCCM on call pager [phone] until 7pm. Please call Elink 7p-7a. [phone]

## 2022-09-03 NOTE — Procedures (Signed)
Arterial Catheter Insertion Procedure Note  Douglas Ballard  [id number]  Oct 12, 1950  Date:09/03/22  Time:3:10 PM    Provider Performing: Freddi Starr    Procedure: Insertion of Arterial Line [id number]) with US guidance (33545)   Indication(s) Blood pressure monitoring and/or need for frequent ABGs  Consent Risks of the procedure as well as the alternatives and risks of each were explained to the patient and/or caregiver.  Consent for the procedure was obtained and is signed in the bedside chart  Anesthesia None   Time Out Verified patient identification, verified procedure, site/side was marked, verified correct patient position, special equipment/implants available, medications/allergies/relevant history reviewed, required imaging and test results available.   Sterile Technique Maximal sterile technique including full sterile barrier drape, hand hygiene, sterile gown, sterile gloves, mask, hair covering, sterile ultrasound probe cover (if used).   Procedure Description Area of catheter insertion was cleaned with chlorhexidine and draped in sterile fashion. With real-time ultrasound guidance an arterial catheter was attempted to be placed into the left radial artery but unsuccessful.      Complications/Tolerance Unsuccessful placement of the left radial aline, small hematoma present   EBL Minimal   Specimen(s) None

## 2022-09-03 NOTE — Consult Note (Signed)
Cardiology Consultation:  Patient ID: Douglas Ballard MRN: [id number]; DOB: 25-Mar-1951  Admit date: 09/16/2022 Date of Consult: 09/03/2022  Primary Care Provider: Vivi Barrack, MD Primary Cardiologist: Douglas Martinique, MD  Primary Electrophysiologist:  None   Patient Profile:  Douglas Ballard is a 72 y.o. male with a hx of persistent atrial fibrillation, CKD stage IV, alcohol abuse, tobacco abuse, diabetes who is being seen today for the evaluation of new onset systolic heart failure at the request of Douglas Jackson, MD.  History of Present Illness:  Douglas Ballard was admitted to the hospital on 09/17/2022 with altered mental status and encephalopathy.  EMS was called after an unwitnessed fall.  Apparently the patient was weak and fatigued and unable to get up.  Apparently has been sick for over 1 month with nausea and vomiting.  In the emergency room was noted to be in A-fib with RVR.  He was very agitated with concerns for alcohol withdrawal.  Labs on admission notable for severe hyponatremia with sodium level at 113.  He was also noted to have an AKI with serum creatinine around 5.11.  Was also in acute liver failure with AST value 712 and ALT value 553.  A BNP value on arrival was 2970.  High-sensitivity troponin 36 and 37 on repeat.  EKG demonstrates A-fib with right bundle branch block and left anterior fascicular block.  There was concerns for sepsis and possible aspiration pneumonia.  He was placed on antibiotics.  For his hyponatremia he was given hypertonic saline.  At the time my examination the patient was undergoing central line placement.  He has been sedated.  Per critical care medicine team he has been very drowsy and altered.  He appears slightly volume overloaded.  Plan to start hemodialysis per nephrology.  I did discuss his overall condition with his brother and wife by phone.  Apparently [name] is an alcoholic and a current every day drinker.  He is drinking up to 7 vodka drinks  per day.  He has had a longstanding history of alcohol abuse.  We discussed that he is critically ill in ICU with multiorgan system failure.  We also discussed his diagnosis of systolic heart failure which is likely related to alcohol.  Heart Pathway Score:       Past Medical History: Past Medical History:  Diagnosis Date   Alcohol withdrawal seizure ([place]) 06/28/2011   Alcohol withdraw seizure prior to admission is suspected from history given by family & Post ictal appearance in the ED.    Anxiety    Atrial fibrillation ([place])    Atypical mole 05/13/2008   LEFT MEDIAL CHEST SLIGHT/MOD.   Atypical mole 10/12/2020   Left Upper Arm-Posterior (moderate to severe)   Atypical mole 10/12/2020   Right Mid Back (Moderate)   Atypical nevi 05/13/2008   LEFT LATERAL CHEST SLIGHT   Atypical nevi 04/29/2015   RIGHT POST SHOULDER MOD/SEV. [place] W/S   Atypical nevi 04/29/2015   RIGHT MID ABDOMEN MODERATE [place] W/S   Atypical nevi 09/27/2015   LEFT UPPER ARM SEVERE TX W/S   Atypical nevi 09/27/2015   MID LOWER BACK MODERATE   Atypical nevi 09/27/2015   MID UPPER BACK SEVERE TX EXC   Atypical nevi 09/27/2015   LEFT UPPER BACK MOD.SEV TX EXC   Atypical nevi 03/23/2016   RIGHT LOWER BACK MILD   Atypical nevi 03/23/2016   LEFT SIDE TORSO MILD   Atypical nevi 06/14/2016   LEFT LOWER BACK MOD/SEV  Atypical nevi 06/14/2016   LEFT CHEST MODERATE   Atypical nevi 08/30/2017   MID UPPER BACK SUP MODERATE   Atypical nevi 08/30/2017   RIGHT CHEST SEVERE    Atypical nevi 07/25/2018   LEFT SHOULDER ANTERIOR TX WIDERSHAVE   Atypical nevi 07/25/2018   LEFT SHOULDER POST MODERATE   Atypical nevi 08/25/2019   LEFT SIDE ABDOMEN MODERATE FREE   Atypical nevi 11/27/2019   RIGHT SHOULDER MODERATE TX W/S   Atypical nevi 11/27/2019   RIGHT UPPER BACK MOD/SEVERE TX W/S   Atypical nevi 11/27/2019   LEFT MID BACK SEVERE TX W/S   Cancer ([place]) 2012   Prostate surgery   Chronic diastolic CHF (congestive heart  failure) ([place]) 11/01/2014   Echo 8/15: Mild LVH, EF 50-55%, moderate BAE  //  b. Echo 7/17: EF 55-60%, normal wall motion, grade 2 diastolic dysfunction, MAC, mild MR, moderate LAE, trivial PI   Chronic kidney disease    Compression fracture of thoracic vertebra ([place]) 06/26/2011   Diabetes mellitus type 2 in nonobese (H[facility])    Fistula, bladder    Frequent urination at night    History of adenomatous polyp of colon 06/14/2014   History of nuclear stress test    a. Myoview 10/15: Overall Impression:  Low risk stress nuclear study demonstrating mild baseline ST-T changes with normal myocardial perfusion and low normal EF of 50%. // b.Myoview 7/17: EF 50%, no ischemia or scar, low risk (EF normal by recent echo)   Hypertension    PNA (pneumonia) 06/26/2011   Sepsis due to urinary tract infection ([place]) 04/16/2014   Stroke ([place]) sept 1, 2015   tia x 3    Past Surgical History: Past Surgical History:  Procedure Laterality Date   BIOPSY  03/16/2020   Procedure: BIOPSY;  Surgeon: Lavena Bullion, DO;  Location: [facility];  Service: Gastroenterology;;   COLON SURGERY     COLONOSCOPY N/A 06/14/2014   Procedure: COLONOSCOPY;  Surgeon: Gatha Mayer, MD;  Location: [facility];  Service: Endoscopy;  Laterality: N/A;   COLONOSCOPY WITH PROPOFOL N/A 03/16/2020   Procedure: COLONOSCOPY WITH PROPOFOL;  Surgeon: Lavena Bullion, DO;  Location: [facility];  Service: Gastroenterology;  Laterality: N/A;   CYSTOSCOPY WITH STENT PLACEMENT Bilateral 06/15/2014   Procedure: CYSTOSCOPY WITH BILATERAL STENT PLACEMENT;  Surgeon: Bernestine Amass, MD;  Location: [facility];  Service: Urology;  Laterality: Bilateral;   ESOPHAGOGASTRODUODENOSCOPY (EGD) WITH PROPOFOL N/A 03/16/2020   Procedure: ESOPHAGOGASTRODUODENOSCOPY (EGD) WITH PROPOFOL;  Surgeon: Lavena Bullion, DO;  [facility];  Service: Gastroenterology;  Laterality: N/A;   LIPOMA EXCISION  2012   Dr Nonah Mattes   moles removed     Back    POLYPECTOMY  03/16/2020   Procedure: POLYPECTOMY;  Surgeon: Lavena Bullion, DO;  [facility];  Service: Gastroenterology;;   PROCTOSCOPY N/A 06/15/2014   Procedure: RIGID PROCTOSCOPY;  Surgeon: Michael Boston, MD;  Location: [facility];  Service: General;  Laterality: N/A;   RADIOLOGY WITH ANESTHESIA N/A 07/28/2014   Procedure: Embolization;  Surgeon: Luanne Bras, MD;  Location: [facility];  Service: Radiology;  Laterality: N/A;   ROBOT ASSISTED LAPAROSCOPIC RADICAL PROSTATECTOMY  01/31/2011   Robotic-assisted laparoscopic radical retropubic     Home Medications:  Prior to Admission medications   Medication Sig Start Date End Date Taking? Authorizing Provider  acetaminophen (TYLENOL) 500 MG tablet Take 500 mg by mouth as needed for moderate pain or headache.   Yes [provider]  cloNIDine (  CATAPRES) 0.1 MG tablet TAKE 1 TABLET EVERY DAY Patient taking differently: Take 0.1 mg by mouth at bedtime. 02/28/22  Yes Douglas Barrack, MD  FEROSUL 325 (65 Fe) MG tablet TAKE 1 TABLET EVERY DAY (SUBSTITUTED FOR FEROSUL) Patient taking differently: Take 325 mg by mouth daily. 04/30/22  Yes Douglas Barrack, MD  folic acid (FOLVITE) 1 MG tablet TAKE 1 TABLET EVERY DAY 02/28/22  Yes Douglas Barrack, MD  furosemide (LASIX) 40 MG tablet Take 1 tablet (40 mg total) by mouth daily. 08/26/20  Yes Ballard, Douglas M, MD  hydrOXYzine (ATARAX) 50 MG tablet TAKE 1 TABLET THREE TIMES DAILY AS NEEDED FOR ANXIETY AND INSOMNIA Patient taking differently: Take 100 mg by mouth every evening. 07/06/22  Yes Douglas Barrack, MD  isosorbide mononitrate (IMDUR) 60 MG 24 hr tablet Take 60 mg by mouth daily.   Yes [provider]  JANUVIA 100 MG tablet TAKE 1 TABLET EVERY DAY Patient taking differently: Take 100 mg by mouth at bedtime. 07/27/22  Yes Douglas Barrack, MD  labetalol (NORMODYNE) 200 MG tablet Take 1 tablet (200 mg total) by mouth 2 (two) times daily. 04/06/20  Yes Almyra Deforest, PA  lactose free  nutrition (BOOST) LIQD Take 237 mLs by mouth daily.   Yes [provider]  pantoprazole (PROTONIX) 40 MG tablet TAKE 1 TABLET TWICE DAILY Patient taking differently: Take 40 mg by mouth 2 (two) times daily. 05/17/22  Yes Douglas Barrack, MD  Rivaroxaban (XARELTO) 15 MG TABS tablet Take 1 tablet (15 mg total) by mouth daily with supper. Patient taking differently: Take 15 mg by mouth at bedtime. 02/01/22  Yes Ballard, Douglas M, MD  triamcinolone cream (KENALOG) 0.1 % Apply topically 2 (two) times daily. Patient taking differently: Apply 1 Application topically as needed (itching). 11/12/19  Yes Sheffield, Kelli R, PA-C  zolpidem (AMBIEN) 10 MG tablet TAKE 1 TABLET (10 MG TOTAL) BY MOUTH AT BEDTIME AS NEEDED FOR SLEEP. Patient taking differently: Take 10 mg by mouth at bedtime. 06/21/22 09/12/2022 Yes Douglas Barrack, MD  eszopiclone 3 MG TABS Take 1 tablet (3 mg total) by mouth at bedtime. Take immediately before bedtime Patient not taking: Reported on 09/14/2022 10/16/21   Douglas Barrack, MD  hydrALAZINE (APRESOLINE) 50 MG tablet Take by mouth. Patient not taking: Reported on 08/28/2022 06/11/22   [provider]  Multiple Vitamin (MULTIVITAMIN WITH MINERALS) TABS tablet Take 1 tablet by mouth daily. Patient not taking: Reported on 09/11/2022 03/18/20   Hosie Poisson, MD  thiamine 100 MG tablet Take 1 tablet (100 mg total) by mouth daily. Patient not taking: Reported on 09/07/2022 03/18/20   Hosie Poisson, MD    Inpatient Medications: Scheduled Meds:  Chlorhexidine Gluconate Cloth  6 each Topical Daily   insulin aspart  0-9 Units Subcutaneous Q4H   ipratropium-albuterol  3 mL Nebulization TID   metoprolol tartrate  25 mg Oral BID   sodium chloride  2 g Oral TID WC   sodium zirconium cyclosilicate  10 g Oral Once   Continuous Infusions:  azithromycin     calcium gluconate     cefTRIAXone (ROCEPHIN)  IV Stopped (09/03/22 0955)   dexmedetomidine (PRECEDEX) IV infusion 0.4 mcg/kg/hr  (09/03/22 0934)   metronidazole 500 mg (09/03/22 1036)   sodium bicarbonate 150 mEq in sterile water 1,150 mL infusion 75 mL/hr at 09/03/22 0915   thiamine (VITAMIN B1) injection Stopped (09/02/22 0909)   PRN Meds: docusate sodium, labetalol, LORazepam, midazolam, polyethylene glycol  Allergies:    Allergies  Allergen Reactions   Hydrochlorothiazide Other (See Comments)    Pt gets hyponatremia   Lasix [Furosemide] Other (See Comments)    Sodium levels drop when take   Nsaids Other (See Comments)    Kidney disease/failure   Sulfa Antibiotics Other (See Comments)    headaches    Social History:   Social History   Socioeconomic History   Marital status: Married    Spouse name: Not on file   Number of children: 2   Years of education: Not on file   Highest education level: Not on file  Occupational History   Occupation: Press photographer and Proofreader  Tobacco Use   Smoking status: Former    Packs/day: 2.00    Years: 25.00    Total pack years: 50.00    Types: Cigarettes    Quit date: 04/14/2014    Years since quitting: 8.3   Smokeless tobacco: Never  Vaping Use   Vaping Use: Never used  Substance and Sexual Activity   Alcohol use: Yes    Alcohol/week: 7.0 - 10.0 standard drinks of alcohol    Types: 7 - 10 Standard drinks or equivalent per week    Comment: 2 bottles of wine with wife a week; cocktail every other night   Drug use: No   Sexual activity: Not Currently  Other Topics Concern   Not on file  Social History Narrative   Fun/Hobby: Walk, cards, gardening    Social Determinants of Health   Financial Resource Strain: Low Risk  (09/25/2021)   Overall Financial Resource Strain (CARDIA)    Difficulty of Paying Living Expenses: Not hard at all  Food Insecurity: No Food Insecurity (09/25/2021)   Hunger Vital Sign    Worried About Running Out of Food in the Last Year: Never true    Ran Out of Food in the Last Year: Never true  Transportation Needs: No Transportation  Needs (09/25/2021)   PRAPARE - Hydrologist (Medical): No    Lack of Transportation (Non-Medical): No  Physical Activity: Sufficiently Active (09/25/2021)   Exercise Vital Sign    Days of Exercise per Week: 6 days    Minutes of Exercise per Session: 30 min  Stress: No Stress Concern Present (09/25/2021)   [place]    Feeling of Stress : Not at all  Social Connections: Moderately Isolated (09/25/2021)   Social Connection and Isolation Panel [NHANES]    Frequency of Communication with Friends and Family: More than three times a week    Frequency of Social Gatherings with Friends and Family: More than three times a week    Attends Religious Services: Never    Marine scientist or Organizations: No    Attends Archivist Meetings: Never    Marital Status: Married  Human resources officer Violence: Not At Risk (09/25/2021)   Humiliation, Afraid, Rape, and Kick questionnaire    Fear of Current or Ex-Partner: No    Emotionally Abused: No    Physically Abused: No    Sexually Abused: No     Family History:    Family History  Problem Relation Age of Onset   Atrial fibrillation Father        onset 18s. Had pacemaker placed for sinus pause   Prostate cancer Father    Breast cancer Mother    Lung cancer Mother    Leukemia Brother    Colon  polyps Brother    Colon cancer Neg Hx    Diabetes Neg Hx      ROS:  All other ROS reviewed and negative. Pertinent positives noted in the HPI.     Physical Exam/Data:   Vitals:   09/03/22 0700 09/03/22 0800 09/03/22 0910 09/03/22 1000  BP: 112/82  113/76 117/84  Pulse: (!) 106  94 97  Resp: (!) 21  (!) 25 (!) 26  Temp:  98.3 F (36.8 C)    TempSrc:  Axillary    SpO2: 100%  100% 99%  Weight:      Height:        Intake/Output Summary (Last 24 hours) at 09/03/2022 1105 Last data filed at 09/03/2022 0100 Gross per 24 hour  Intake 1258.71 ml   Output 550 ml  Net 708.71 ml       08/25/2022    8:31 AM 04/20/2022    8:46 AM 01/17/2022    6:17 PM  Last 3 Weights  Weight (lbs) 160 lb 158 lb 3.2 oz 156 lb 4.9 oz  Weight (kg) 72.576 kg 71.759 kg 70.9 kg    Body mass index is 23.63 kg/m.  General: Ill-appearing Head: Atraumatic, normal size  Eyes: PEERLA, EOMI  Neck: Supple, JVD 10-12 cmH2O Endocrine: No thryomegaly Cardiac: Normal S1, S2; irregular rhythm Lungs: Diminished sounds bilaterally Abd: Soft, nontender, no hepatomegaly  Ext: 2+ lower EXTR edema Musculoskeletal: No deformities Skin: Warm and dry, no rashes   Neuro: Drowsy, ill-appearing, will not awaken follow commands  EKG:  The EKG was personally reviewed and demonstrates: A-fib heart rate 105, right bundle branch block, left anterior fascicular block Telemetry:  Telemetry was personally reviewed and demonstrates: A-fib heart rate 90 to 120 bpm  Relevant CV Studies: TTE 09/16/2022  1. Left ventricular ejection fraction, by estimation, is 20 to 25%. The  left ventricle has severely decreased function. The left ventricle  demonstrates global hypokinesis. The left ventricular internal cavity size  was mildly dilated. There is mild left  ventricular hypertrophy. Left ventricular diastolic function could not be  evaluated. Elevated left atrial pressure.   2. Right ventricular systolic function is mildly reduced. The right  ventricular size is moderately enlarged. There is mildly elevated  pulmonary artery systolic pressure.   3. Left atrial size was severely dilated.   4. Right atrial size was severely dilated.   5. The mitral valve is normal in structure. Mild mitral valve  regurgitation. No evidence of mitral stenosis.   6. Tricuspid valve regurgitation is mild to moderate.   7. The aortic valve is tricuspid. Aortic valve regurgitation is trivial.  No aortic stenosis is present.   8. The inferior vena cava is normal in size with greater than 50%  respiratory  variability, suggesting right atrial pressure of 3 mmHg.   Laboratory Data: High Sensitivity Troponin:   Recent Labs  Lab 09/03/2022 0855 09/03/2022 1130  TROPONINIHS 36* 37*     Cardiac EnzymesNo results for input(s): "TROPONINI" in the last 168 hours. No results for input(s): "TROPIPOC" in the last 168 hours.  Chemistry Recent Labs  Lab 09/02/22 0841 09/02/22 1244 09/02/22 1835 09/02/22 2037 09/03/22 0232 09/03/22 0525 09/03/22 0529  NA 126*   < > 128*   < > 134* 131* 132*  K 5.1  --  4.8  --   --   --  5.9*  CL 92*  --  95*  --   --   --  96*  CO2 11*  --  11*  --   --   --  10*  GLUCOSE 120*  --  120*  --   --   --  125*  BUN 85*  --  91*  --   --   --  99*  CREATININE 5.95*  --  6.14*  --   --   --  6.38*  CALCIUM 6.8*  --  6.6*  --   --   --  6.4*  GFRNONAA 9*  --  9*  --   --   --  9*  ANIONGAP 23*  --  22*  --   --   --  26*   < > = values in this interval not displayed.    Recent Labs  Lab 08/22/2022 0855 08/31/22 0330 09/01/22 2345 09/02/22 0841 09/03/22 0529  PROT 6.9 5.8* 6.4*  --   --   ALBUMIN 3.1* 2.6* 2.9* 2.6* 2.5*  AST 712* 1,370* 718*  --   --   ALT 553* 856* 914*  --   --   ALKPHOS 75 69 128*  --   --   BILITOT 2.1* 1.4* 2.4*  --   --    Hematology Recent Labs  Lab 09/01/22 2345 09/02/22 1244 09/03/22 0525  WBC 15.4* 16.7* 14.5*  RBC 3.16* 2.94* 2.61*  HGB 10.0* 9.4* 8.3*  HCT 28.2* 26.4* 24.1*  MCV 89.2 89.8 92.3  MCH 31.6 32.0 31.8  MCHC 35.5 35.6 34.4  RDW 11.9 12.3 12.7  PLT 85* 66* 54*   BNP Recent Labs  Lab 09/01/2022 0855  BNP 2,970.3*    DDimer No results for input(s): "DDIMER" in the last 168 hours.  Radiology/Studies:  CT ABDOMEN PELVIS WO CONTRAST  Result Date: 09/03/2022 CLINICAL DATA:  Encephalopathic.  Abdominal pain.  Fall. EXAM: CT ABDOMEN AND PELVIS WITHOUT CONTRAST TECHNIQUE: Multidetector CT imaging of the abdomen and pelvis was performed following the standard protocol without IV contrast. RADIATION DOSE  REDUCTION: This exam was performed according to the departmental dose-optimization program which includes automated exposure control, adjustment of the mA and/or kV according to patient size and/or use of iterative reconstruction technique. COMPARISON:  None FINDINGS: Lower chest: Patchy foci of ground-glass density within the RIGHT middle lobe and RIGHT lower lobe. Bilateral small effusions. Hepatobiliary: No focal hepatic lesion on noncontrast exam. Gallbladder normal. Pancreas: Pancreas is normal. No ductal dilatation. No pancreatic inflammation. Spleen: Normal spleen Adrenals/urinary tract: Adrenal glands and kidneys normal. Foley catheter and gas within bladder Stomach/Bowel: Stomach is distended with fluid. Small bowel is normal without evidence distension. The colon and rectosigmoid colon are normal. Vascular/Lymphatic: Abdominal aorta is normal caliber with atherosclerotic calcification. There is no retroperitoneal or periportal lymphadenopathy. No pelvic lymphadenopathy. Reproductive: Post prostatectomy. Other: No free fluid. Musculoskeletal: No aggressive osseous lesion. Anasarca subcutaneous tissues of the flank. IMPRESSION: 1. No evidence of trauma in the abdomen pelvis. 2. Patchy ground-glass density in the RIGHT middle lobe and RIGHT lower lobe could represent pulmonary infection or edema. 3. Small bilateral effusions. 4. Stomach distended with fluid.  No evidence of bowel obstruction. 5. Foley catheter in place. 6. Subcutaneous anasarca 7.  Aortic Atherosclerosis (ICD10-I70.0). Electronically Signed   By: Suzy Bouchard M.D.   On: 09/03/2022 10:39   DG CHEST PORT 1 VIEW  Result Date: 09/03/2022 CLINICAL DATA:  Altered mental status. History of atrial fibrillation, hypertension and pneumonia. EXAM: PORTABLE CHEST 1 VIEW COMPARISON:  08/29/2022; 03/14/2020 FINDINGS: Grossly unchanged enlarged cardiac silhouette and mediastinal contours with atherosclerotic plaque  within the thoracic aorta.  Worsening slightly nodular airspace opacities within the peripheral aspect of the right mid lung. Worsening bilateral infrahilar opacities. Trace bilateral effusions are not excluded. No pneumothorax. No acute osseous abnormalities. Old left seventh rib fracture. IMPRESSION: Worsening right mid lung and bilateral infrahilar heterogeneous opacities, potentially atelectasis though multifocal infection, including atypical etiologies, could have a similar appearance. Further evaluation with a PA and lateral chest radiograph may be obtained as clinically indicated. Electronically Signed   By: Sandi Mariscal M.D.   On: 09/03/2022 08:48   CT HEAD WO CONTRAST (5MM)  Result Date: 09/03/2022 CLINICAL DATA:  Mental status change, unknown cause EXAM: CT HEAD WITHOUT CONTRAST TECHNIQUE: Contiguous axial images were obtained from the base of the skull through the vertex without intravenous contrast. RADIATION DOSE REDUCTION: This exam was performed according to the departmental dose-optimization program which includes automated exposure control, adjustment of the mA and/or kV according to patient size and/or use of iterative reconstruction technique. COMPARISON:  CT head 08/22/2022 FINDINGS: Brain: Patchy and confluent areas of decreased attenuation are noted throughout the deep and periventricular white matter of the cerebral hemispheres bilaterally, compatible with chronic microvascular ischemic disease. No evidence of large-territorial acute infarction. No parenchymal hemorrhage. No mass lesion. No extra-axial collection. No mass effect or midline shift. No hydrocephalus. Basilar cisterns are patent. Vascular: No hyperdense vessel. Skull: No acute fracture or focal lesion. Sinuses/Orbits: Paranasal sinuses and mastoid air cells are clear. The orbits are unremarkable. Other: None. IMPRESSION: No acute intracranial abnormality. Electronically Signed   By: Iven Finn M.D.   On: 09/03/2022 01:23   US Abdomen Limited RUQ  (LIVER/GB)  Result Date: 09/04/2022 CLINICAL DATA:  Transaminitis. EXAM: ULTRASOUND ABDOMEN LIMITED RIGHT UPPER QUADRANT COMPARISON:  Renal ultrasound dated 09/06/2022. Abdomen and pelvis CT dated 02/15/2016. FINDINGS: Gallbladder: No gallstones or wall thickening visualized. No sonographic Murphy sign noted by sonographer. Common bile duct: Diameter: 4.2 mm Liver: No focal lesion identified. Within normal limits in parenchymal echogenicity. Portal vein is patent on color Doppler imaging with normal direction of blood flow towards the liver. Other: None. IMPRESSION: Normal examination. Electronically Signed   By: Claudie Revering M.D.   On: 09/13/2022 17:41   ECHOCARDIOGRAM COMPLETE  Result Date: 09/14/2022    ECHOCARDIOGRAM REPORT   Patient Name:   BLAIRE HODSDON Date of Exam: 08/31/2022 Medical Rec #:  [id number]      Height:       69.0 in Accession #:    [id number]     Weight:       160.0 lb Date of Birth:  September 13, 1950     BSA:          1.879 m Patient Age:    79 years       BP:           142/100 mmHg Patient Gender: M              HR:           111 bpm. Exam Location:  Inpatient Procedure: Intracardiac Opacification Agent Indications:    atrial fibrillation  History:        Patient has prior history of Echocardiogram examinations, most                 recent 03/14/2020. Arrythmias:Atrial Fibrillation; Risk                 Factors:Diabetes and Hypertension.  Sonographer:    Harvie Junior Referring Phys: [place]  Sonographer Comments: Technically difficult study due to poor echo windows. IMPRESSIONS  1. Left ventricular ejection fraction, by estimation, is 20 to 25%. The left ventricle has severely decreased function. The left ventricle demonstrates global hypokinesis. The left ventricular internal cavity size was mildly dilated. There is mild left ventricular hypertrophy. Left ventricular diastolic function could not be evaluated. Elevated left atrial pressure.  2. Right ventricular systolic function  is mildly reduced. The right ventricular size is moderately enlarged. There is mildly elevated pulmonary artery systolic pressure.  3. Left atrial size was severely dilated.  4. Right atrial size was severely dilated.  5. The mitral valve is normal in structure. Mild mitral valve regurgitation. No evidence of mitral stenosis.  6. Tricuspid valve regurgitation is mild to moderate.  7. The aortic valve is tricuspid. Aortic valve regurgitation is trivial. No aortic stenosis is present.  8. The inferior vena cava is normal in size with greater than 50% respiratory variability, suggesting right atrial pressure of 3 mmHg. FINDINGS  Left Ventricle: Left ventricular ejection fraction, by estimation, is 20 to 25%. The left ventricle has severely decreased function. The left ventricle demonstrates global hypokinesis. Definity contrast agent was given IV to delineate the left ventricular endocardial borders. The left ventricular internal cavity size was mildly dilated. There is mild left ventricular hypertrophy. Left ventricular diastolic function could not be evaluated due to atrial fibrillation. Left ventricular diastolic function could not be evaluated. Elevated left atrial pressure. Right Ventricle: The right ventricular size is moderately enlarged. Right ventricular systolic function is mildly reduced. There is mildly elevated pulmonary artery systolic pressure. The tricuspid regurgitant velocity is 3.05 m/s, and with an assumed right atrial pressure of 3 mmHg, the estimated right ventricular systolic pressure is 74.9 mmHg. Left Atrium: Left atrial size was severely dilated. Right Atrium: Right atrial size was severely dilated. Pericardium: Trivial pericardial effusion is present. Mitral Valve: The mitral valve is normal in structure. Mild mitral annular calcification. Mild mitral valve regurgitation. No evidence of mitral valve stenosis. Tricuspid Valve: The tricuspid valve is normal in structure. Tricuspid valve  regurgitation is mild to moderate. No evidence of tricuspid stenosis. Aortic Valve: The aortic valve is tricuspid. Aortic valve regurgitation is trivial. Aortic regurgitation PHT measures 394 msec. No aortic stenosis is present. Aortic valve mean gradient measures 2.6 mmHg. Aortic valve peak gradient measures 4.8 mmHg. Aortic valve area, by VTI measures 2.14 cm. Pulmonic Valve: The pulmonic valve was normal in structure. Pulmonic valve regurgitation is trivial. No evidence of pulmonic stenosis. Aorta: The aortic root is normal in size and structure. Venous: The inferior vena cava is normal in size with greater than 50% respiratory variability, suggesting right atrial pressure of 3 mmHg. IAS/Shunts: No atrial level shunt detected by color flow Doppler.  LEFT VENTRICLE PLAX 2D LVIDd:         5.40 cm      Diastology LVIDs:         4.50 cm      LV e' medial:    4.21 cm/s LV PW:         1.30 cm      LV E/e' medial:  24.3 LV IVS:        1.20 cm      LV e' lateral:   7.98 cm/s LVOT diam:     2.00 cm      LV E/e' lateral: 12.8 LV SV:         33 LV SV Index:   17 LVOT Area:  3.14 cm  LV Volumes (MOD) LV vol d, MOD A2C: 159.5 ml LV vol d, MOD A4C: 178.5 ml LV vol s, MOD A2C: 101.5 ml LV vol s, MOD A4C: 129.0 ml LV SV MOD A2C:     58.0 ml LV SV MOD A4C:     178.5 ml LV SV MOD BP:      55.7 ml RIGHT VENTRICLE RV Basal diam:  5.30 cm RV Mid diam:    4.50 cm RV S prime:     9.84 cm/s TAPSE (M-mode): 1.2 cm LEFT ATRIUM             Index        RIGHT ATRIUM           Index LA diam:        4.80 cm 2.55 cm/m   RA Area:     28.85 cm LA Vol (A2C):   69.5 ml 36.99 ml/m  RA Volume:   111.50 ml 59.34 ml/m LA Vol (A4C):   49.9 ml 26.56 ml/m LA Biplane Vol: 60.8 ml 32.36 ml/m  AORTIC VALVE                    PULMONIC VALVE AV Area (Vmax):    2.47 cm     PV Vmax:          0.74 m/s AV Area (Vmean):   2.22 cm     PV Peak grad:     2.2 mmHg AV Area (VTI):     2.14 cm     PR End Diast Vel: 8.64 msec AV Vmax:           109.94 cm/s  AV Vmean:          75.260 cm/s AV VTI:            0.152 m AV Peak Grad:      4.8 mmHg AV Mean Grad:      2.6 mmHg LVOT Vmax:         86.27 cm/s LVOT Vmean:        53.133 cm/s LVOT VTI:          0.104 m LVOT/AV VTI ratio: 0.68 AI PHT:            394 msec  AORTA Ao Root diam: 3.10 cm Ao Asc diam:  3.10 cm MITRAL VALVE                TRICUSPID VALVE MV Area (PHT): 5.26 cm     TR Peak grad:   37.2 mmHg MV Decel Time: 144 msec     TR Vmax:        305.00 cm/s MR Peak grad: 76.6 mmHg MR Vmax:      437.60 cm/s   SHUNTS MV E velocity: 102.50 cm/s  Systemic VTI:  0.10 m                             Systemic Diam: 2.00 cm Kirk Ruths MD Electronically signed by Kirk Ruths MD Signature Date/Time: 09/03/2022/5:07:43 PM    Final    US RENAL  Result Date: 08/24/2022 CLINICAL DATA:  Acute kidney injury. EXAM: RENAL / URINARY TRACT ULTRASOUND COMPLETE COMPARISON:  Eight twenty six two thousand fifteen FINDINGS: Right Kidney: Renal measurements: 11.7 x 6.5 x 4.5 cm = volume: 176 mL. Echogenicity within normal limits. No mass or hydronephrosis visualized. Left Kidney: Renal measurements: 10.2 x 5.2 x 3.8  cm = volume: 104 mL. Echogenicity within normal limits. No mass or hydronephrosis visualized. Bladder: Appears normal for degree of bladder distention. Other: None. IMPRESSION: Normal examination. Electronically Signed   By: Claudie Revering M.D.   On: 08/29/2022 14:02    Assessment and Plan:   # New onset systolic heart failure, EF 20-25% # A-fib with RVR -Currently admitted to the ICU with alcohol withdrawal aspiration pneumonia and septic shock.  He was found to have new onset systolic dysfunction.  His ejection fraction is 20 to 25% -He also has significant RV dysfunction. -Currently hypotensive but did receive sedation.  Plans for hemodialysis per renal and critical care medicine. 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  He is drinking up to 7 alcoholic drinks per day. -Per critical care medicine  his blood pressure was elevated yesterday.  It is a bit low today but he has a wide pulse pressure.  I suspect sepsis is going on. -From his cardiomyopathy standpoint, at the moment we have very little to offer.  He is in acute renal failure that will require initiation of hemodialysis.  He is an ongoing alcoholic with alcohol withdrawal.  He has aspiration pneumonia with concerns for pneumonia on chest imaging. -At the moment cardiology has very little to offer.  Would continue with volume management per nephrology.   -No ACE/ARB/ARNI/MRA given AKI now likely requiring dialysis. -BPs are soft.  Would avoid Imdur and hydralazine at this time. -He is in A-fib with RVR but this is secondary to his critical illness. -He has a wide pulse pressure.  I do not believe he is in cardiogenic shock.  I am okay to continue with metoprolol tartrate 25 mg twice daily.  Can titrate up for better rate control.  If he runs into issues with hypotension would recommend Levophed as a first-line pressor.  We can also obtain a Co. ox off his central line if his pressures remain low.  Will continue with antibiotics.  If A-fib becomes uncontrolled and pressures are soft would recommend amiodarone drip.  Liver imaging appears to be unremarkable. -Regarding anticoagulation would recommend to hold this.  His hemoglobin is trending down.  Platelets are trending down.  I believe he may be running into a bleeding issue.  Etiology for this is either sepsis or liver related.  Of note, no cirrhosis noted on liver imaging. -Cardiology will follow along.  Overall his prognosis is poor.  I did relay this to his wife.  # AKI # Acute liver failure # Septic shock/aspiration pneumonia # Metabolic acidosis # Anemia/thrombocytopenia #Etoh Withdrawal  -Antibiotics per primary team.  Further workup per primary team.  He is critically ill.  Cardiology will follow along.  CRITICAL CARE Performed by: Lake Bells T O'Neal  Total critical care  time: 45 minutes. Critical care time was exclusive of separately billable procedures and treating other patients. Critical care was necessary to treat or prevent imminent or life-threatening deterioration. Critical care was time spent personally by me on the following activities: development of treatment plan with patient and/or surrogate as well as nursing, discussions with consultants, evaluation of patient's response to treatment, examination of patient, obtaining history from patient or surrogate, ordering and performing treatments and interventions, ordering and review of laboratory studies, ordering and review of radiographic studies, pulse oximetry and re-evaluation of patient's condition.  Signed, Addison Naegeli. Audie Box, MD, [place]  09/03/2022 11:50 AM   For questions or updates, please contact [place] Please  consult [email or web address] for contact info under

## 2022-09-03 NOTE — Progress Notes (Addendum)
PHARMACY NOTE:  ANTIMICROBIAL RENAL DOSAGE ADJUSTMENT  Current antimicrobial regimen includes a mismatch between antimicrobial dosage and estimated renal function.  As per policy approved by the Pharmacy & Therapeutics and Medical Executive Committees, the antimicrobial dosage will be adjusted accordingly.  Indication: sepsis, CAP vs Asp PNA  Renal Function:  Estimated Creatinine Clearance: 10.6 mL/min (A) (by C-G formula based on SCr of 6.38 mg/dL (H)). '[]'$      On intermittent HD, scheduled: '[x]'$      On CRRT    Antimicrobial dosage has been changed to:  cefepime 2 gm IV q 12 for CRRT dosing   Thank you for allowing pharmacy to be a part of this patient's care.   Eudelia Bunch, Pharm.D Use secure chat for questions 09/03/2022 12:58 PM

## 2022-09-03 NOTE — Progress Notes (Signed)
2 units of emergent blood ordered per Dr. [name].  Unit #[id number] verified with this RN and Cami Wendall Stade. Started 1315, ended 1350. Vitals documented.  Unit #[id number] verified with this RN and Orlie Dakin. RN. Started 1350, ended 1424. Vitals documented in chart. Dr. [name] at bedside.

## 2022-09-03 NOTE — Procedures (Signed)
Central Venous Catheter Insertion Procedure Note  Douglas Ballard  [id number]  Aug 19, 1951  Date:09/03/22  Time:11:30 AM   Provider Performing:Monte Zinni B [name]   Procedure: Insertion of Non-tunneled Central Venous Catheter(36556)with US guidance (23557)    Indication(s) Hemodialysis  Consent Risks of the procedure as well as the alternatives and risks of each were explained to the patient and/or caregiver.  Consent for the procedure was obtained and is signed in the bedside chart  Anesthesia Topical only with 1% lidocaine   Timeout Verified patient identification, verified procedure, site/side was marked, verified correct patient position, special equipment/implants available, medications/allergies/relevant history reviewed, required imaging and test results available.  Sterile Technique Maximal sterile technique including full sterile barrier drape, hand hygiene, sterile gown, sterile gloves, mask, hair covering, sterile ultrasound probe cover (if used).  Procedure Description Area of catheter insertion was cleaned with chlorhexidine and draped in sterile fashion.   With real-time ultrasound guidance a HD catheter was placed into the right internal jugular vein.  Nonpulsatile blood flow and easy flushing noted in all ports.  The catheter was sutured in place and sterile dressing applied.  Complications/Tolerance None; patient tolerated the procedure well. Chest X-ray is ordered to verify placement for internal jugular or subclavian cannulation.  Chest x-ray is not ordered for femoral cannulation.  EBL Minimal  Specimen(s) None

## 2022-09-03 NOTE — Progress Notes (Signed)
[facility] Progress Note   Assessment/ Plan:   Assessment/ Plan: AKI on CKD4 - b/l creatinine 3.7- 4.0 from sept - dec 2023, eGFR 15- 81m/min. Creat here 5.1 on admission in setting of AMS, hyponatremia, etoh w/d. Now not improving- needs CRRT- 2K bath.  No heparin, sodium citrate locks Hyponatremia - euvolemic vs possibly hypervolemic (CXR mild changes). Could have beer potomania (low solute). UNa low and UOsm mid-range. Na+ was 114 on presentation. SP 3% saline from 1/11- 11/13. TSH and cortisol wnl. Na+ dropped after 3% stopped. Was going to try salt tabs and fluid restriction for poss SIADH  but Na+ dropped again so 3% saline was restarted by CCM. Expect to correct all the way with CRRT Met acidosis - likely due to advanced CKD, improved sp bicarb IVF Can stop bicarb gtt when on CRRT Hypocalcemia - very low Ca++ level in med 4x, this is new and prob from advanced CKD. Getting IV Ca++ continuous per CCM. Improved serum Ca++ up to 7.2.  HTN - was on 3 BP lowering meds at home. BP's here are stable to high. Getting metoprolol 25 bid.   Etoh abuse - per pmd AMS - multifactorial due to low Na, etoh w/d, uremia HFrEF - echo showing LVEF 25-30% H/o CVA      Subjective:    Encephalopathic now, not doing well, K up, UOP down- needs CRRT, d/w PCCM   Objective:   BP (!) 85/48   Pulse 90   Temp 98.3 F (36.8 C) (Axillary)   Resp (!) 26   Ht '5\' 9"'$  (1.753 m)   Wt 72.6 kg   SpO2 90%   BMI 23.63 kg/m   Intake/Output Summary (Last 24 hours) at 09/03/2022 1321 Last data filed at 09/03/2022 1109 Gross per 24 hour  Intake 1369.57 ml  Output 550 ml  Net 819.57 ml   Weight change:   Physical Exam: GPRF:[id number]: tachy Resp:tachypneic Abd: + fluid wave Ext: 2+ anasarca ACCESS: R IJ nontunneled HD cath  Imaging: DG Abd 1 View  Addendum Date: 09/03/2022   ADDENDUM REPORT: 09/03/2022 12:23 ADDENDUM: Critical Value/emergent results were called by telephone at  the time of interpretation on 09/03/2022 at 12:23 pm [facility] verbally acknowledged these results. Electronically Signed   By: EMisty StanleyM.D.   On: 09/03/2022 12:23   Result Date: 09/03/2022 CLINICAL DATA:  NG tube placement. EXAM: ABDOMEN - 1 VIEW COMPARISON:  Earlier same day. FINDINGS: 1206 hours. The NG tube tip follows a course consistent with placement in the left mainstem bronchus. There is no evidence of hiatal hernia on CT scan performed earlier today to account for the course of the NG tube. Right IJ central line again noted. Basilar airspace disease with left effusion noted in the lower chest. IMPRESSION: NG tube tip follows a course consistent with placement in the left mainstem bronchus. Tube should be removed and replaced. Electronically Signed: By: EMisty StanleyM.D. On: 09/03/2022 12:19   DG CHEST PORT 1 VIEW  Result Date: 09/03/2022 CLINICAL DATA:  Central line placement EXAM: PORTABLE CHEST 1 VIEW COMPARISON:  09/03/2022, 8:24 a.m. FINDINGS: Interval placement of right neck multi lumen vascular catheter, tip near the superior cavoatrial junction. Otherwise unchanged AP portable chest radiograph featuring cardiomegaly, diffuse bilateral interstitial pulmonary opacity, and probable layering left pleural effusion. No focal airspace opacity. IMPRESSION: 1. Interval placement of right neck multi lumen vascular catheter, tip near the superior cavoatrial junction. 2. Otherwise unchanged AP  portable chest radiograph featuring cardiomegaly, diffuse bilateral interstitial pulmonary opacity, and probable layering left pleural effusion. No new or focal airspace opacity. Electronically Signed   By: Delanna Ahmadi M.D.   On: 09/03/2022 12:01   CT ABDOMEN PELVIS WO CONTRAST  Result Date: 09/03/2022 CLINICAL DATA:  Encephalopathic.  Abdominal pain.  Fall. EXAM: CT ABDOMEN AND PELVIS WITHOUT CONTRAST TECHNIQUE: Multidetector CT imaging of the abdomen and pelvis was performed  following the standard protocol without IV contrast. RADIATION DOSE REDUCTION: This exam was performed according to the departmental dose-optimization program which includes automated exposure control, adjustment of the mA and/or kV according to patient size and/or use of iterative reconstruction technique. COMPARISON:  None FINDINGS: Lower chest: Patchy foci of ground-glass density within the RIGHT middle lobe and RIGHT lower lobe. Bilateral small effusions. Hepatobiliary: No focal hepatic lesion on noncontrast exam. Gallbladder normal. Pancreas: Pancreas is normal. No ductal dilatation. No pancreatic inflammation. Spleen: Normal spleen Adrenals/urinary tract: Adrenal glands and kidneys normal. Foley catheter and gas within bladder Stomach/Bowel: Stomach is distended with fluid. Small bowel is normal without evidence distension. The colon and rectosigmoid colon are normal. Vascular/Lymphatic: Abdominal aorta is normal caliber with atherosclerotic calcification. There is no retroperitoneal or periportal lymphadenopathy. No pelvic lymphadenopathy. Reproductive: Post prostatectomy. Other: No free fluid. Musculoskeletal: No aggressive osseous lesion. Anasarca subcutaneous tissues of the flank. IMPRESSION: 1. No evidence of trauma in the abdomen pelvis. 2. Patchy ground-glass density in the RIGHT middle lobe and RIGHT lower lobe could represent pulmonary infection or edema. 3. Small bilateral effusions. 4. Stomach distended with fluid.  No evidence of bowel obstruction. 5. Foley catheter in place. 6. Subcutaneous anasarca 7.  Aortic Atherosclerosis (ICD10-I70.0). Electronically Signed   By: Suzy Bouchard M.D.   On: 09/03/2022 10:39   DG CHEST PORT 1 VIEW  Result Date: 09/03/2022 CLINICAL DATA:  Altered mental status. History of atrial fibrillation, hypertension and pneumonia. EXAM: PORTABLE CHEST 1 VIEW COMPARISON:  08/29/2022; 03/14/2020 FINDINGS: Grossly unchanged enlarged cardiac silhouette and mediastinal  contours with atherosclerotic plaque within the thoracic aorta. Worsening slightly nodular airspace opacities within the peripheral aspect of the right mid lung. Worsening bilateral infrahilar opacities. Trace bilateral effusions are not excluded. No pneumothorax. No acute osseous abnormalities. Old left seventh rib fracture. IMPRESSION: Worsening right mid lung and bilateral infrahilar heterogeneous opacities, potentially atelectasis though multifocal infection, including atypical etiologies, could have a similar appearance. Further evaluation with a PA and lateral chest radiograph may be obtained as clinically indicated. Electronically Signed   By: Sandi Mariscal M.D.   On: 09/03/2022 08:48   CT HEAD WO CONTRAST (5MM)  Result Date: 09/03/2022 CLINICAL DATA:  Mental status change, unknown cause EXAM: CT HEAD WITHOUT CONTRAST TECHNIQUE: Contiguous axial images were obtained from the base of the skull through the vertex without intravenous contrast. RADIATION DOSE REDUCTION: This exam was performed according to the departmental dose-optimization program which includes automated exposure control, adjustment of the mA and/or kV according to patient size and/or use of iterative reconstruction technique. COMPARISON:  CT head 08/29/2022 FINDINGS: Brain: Patchy and confluent areas of decreased attenuation are noted throughout the deep and periventricular white matter of the cerebral hemispheres bilaterally, compatible with chronic microvascular ischemic disease. No evidence of large-territorial acute infarction. No parenchymal hemorrhage. No mass lesion. No extra-axial collection. No mass effect or midline shift. No hydrocephalus. Basilar cisterns are patent. Vascular: No hyperdense vessel. Skull: No acute fracture or focal lesion. Sinuses/Orbits: Paranasal sinuses and mastoid air cells are clear. The orbits are  unremarkable. Other: None. IMPRESSION: No acute intracranial abnormality. Electronically Signed   By: Iven Finn M.D.   On: 09/03/2022 01:23    Labs: BMET Recent Labs  Lab 09/14/2022 0855 09/03/2022 1222 08/31/22 2144 09/01/22 0409 09/01/22 1412 09/01/22 1829 09/01/22 2345 09/02/22 0841 09/02/22 1244 09/02/22 1650 09/02/22 1835 09/02/22 2037 09/03/22 0232 09/03/22 0525 09/03/22 0529  NA 113*   < > 123* 122* 121*   < > 125*  125* 126* 131* 130* 128* 133* 134* 131* 132*  K 4.5   < > 4.1 3.4* 4.6  --  4.5 5.1  --   --  4.8  --   --   --  5.9*  CL 79*   < > 89* 83* 85*  --  92* 92*  --   --  95*  --   --   --  96*  CO2 15*   < > 13* 22 15*  --  13* 11*  --   --  11*  --   --   --  10*  GLUCOSE 179*   < > 176* 120* 150*  --  128* 120*  --   --  120*  --   --   --  125*  BUN 64*   < > 79* 75* 78*  --  86* 85*  --   --  91*  --   --   --  99*  CREATININE 5.11*   < > 5.62* 4.92* 5.57*  --  5.79* 5.95*  --   --  6.14*  --   --   --  6.38*  CALCIUM 4.9*   < > 6.2* 6.6* 8.0*  --  7.2* 6.8*  --   --  6.6*  --   --   --  6.4*  PHOS 5.3*  --   --   --   --   --   --  7.4*  --   --   --   --   --   --  9.6*   < > = values in this interval not displayed.   CBC Recent Labs  Lab 09/02/2022 0855 08/31/22 0330 09/01/22 0409 09/01/22 2345 09/02/22 1244 09/03/22 0525 09/03/22 0904 09/03/22 1042  WBC 19.8*   < > 12.2* 15.4* 16.7* 14.5*  --   --   NEUTROABS 17.0*  --   --   --   --   --   --  11.6*  HGB 10.3*   < > 8.5* 10.0* 9.4* 8.3*  --   --   HCT 28.3*   < > 23.3* 28.2* 26.4* 24.1*  --   --   MCV 87.3   < > 86.0 89.2 89.8 92.3  --   --   PLT 185   < > 108* 85* 66* 54* PENDING  --    < > = values in this interval not displayed.    Medications:     sodium chloride   Intravenous Once   sodium chloride   Intravenous Once   sodium chloride   Intravenous Once   Chlorhexidine Gluconate Cloth  6 each Topical Daily   insulin aspart  0-9 Units Subcutaneous Q4H   ipratropium-albuterol  3 mL Nebulization TID   metoprolol tartrate  25 mg Oral BID   pantoprazole (PROTONIX) IV  40 mg Intravenous  Q12H   sodium bicarbonate  50 mEq Intravenous Once   sodium chloride  2 g Oral TID WC  sodium zirconium cyclosilicate  10 g Oral Once    Madelon Lips MD 09/03/2022, 1:21 PM

## 2022-09-03 NOTE — Procedures (Signed)
Central Venous Catheter Insertion Procedure Note  Douglas Ballard  [id number]  1950-11-28  Date:09/03/22  Time:3:09 PM   Provider Performing:Ronette Hank B [name]   Procedure: Insertion of Non-tunneled Central Venous [phone]) with US guidance ([id number])   Indication(s) Medication administration  Consent Risks of the procedure as well as the alternatives and risks of each were explained to the patient and/or caregiver.  Consent for the procedure was obtained and is signed in the bedside chart  Anesthesia Topical only with 1% lidocaine   Timeout Verified patient identification, verified procedure, site/side was marked, verified correct patient position, special equipment/implants available, medications/allergies/relevant history reviewed, required imaging and test results available.  Sterile Technique Maximal sterile technique including full sterile barrier drape, hand hygiene, sterile gown, sterile gloves, mask, hair covering, sterile ultrasound probe cover (if used).  Procedure Description Area of catheter insertion was cleaned with chlorhexidine and draped in sterile fashion.  With real-time ultrasound guidance a central venous catheter was placed into the left internal jugular vein. Nonpulsatile blood flow and easy flushing noted in all ports.  The catheter was sutured in place and sterile dressing applied.  Complications/Tolerance None; patient tolerated the procedure well. Chest X-ray is ordered to verify placement for internal jugular or subclavian cannulation.   Chest x-ray is not ordered for femoral cannulation.  EBL Minimal  Specimen(s) None

## 2022-09-03 NOTE — IPAL (Signed)
  Interdisciplinary Goals of Care Family Meeting   Date carried out: 09/03/2022  Location of the meeting: Phone conference  Member's involved: Physician and Family Member or next of kin  Durable Power of Attorney or acting medical decision maker: Lenetta Quaker  Discussion: We discussed goals of care for [employer] .  I spoke with [name] over the phone regarding updates on Douglas Ballard. She is ok with continuing aggressive measures and doing what needs to be done including intubation and CPR.   Code status:   Code Status: Full Code   Disposition: Continue current acute care  Time spent for the meeting: 20 minutes    Freddi Starr, MD  09/03/2022, 12:52 PM

## 2022-09-04 DIAGNOSIS — A419 Sepsis, unspecified organism: Secondary | ICD-10-CM | POA: Diagnosis not present

## 2022-09-04 DIAGNOSIS — R6521 Severe sepsis with septic shock: Secondary | ICD-10-CM | POA: Diagnosis not present

## 2022-09-04 DIAGNOSIS — Z515 Encounter for palliative care: Secondary | ICD-10-CM

## 2022-09-04 DIAGNOSIS — S0990XA Unspecified injury of head, initial encounter: Secondary | ICD-10-CM | POA: Diagnosis not present

## 2022-09-04 DIAGNOSIS — I4891 Unspecified atrial fibrillation: Secondary | ICD-10-CM | POA: Diagnosis not present

## 2022-09-04 DIAGNOSIS — F10930 Alcohol use, unspecified with withdrawal, uncomplicated: Secondary | ICD-10-CM | POA: Diagnosis not present

## 2022-09-04 DIAGNOSIS — Z7189 Other specified counseling: Secondary | ICD-10-CM

## 2022-09-04 DIAGNOSIS — N179 Acute kidney failure, unspecified: Secondary | ICD-10-CM | POA: Diagnosis not present

## 2022-09-04 DIAGNOSIS — I5021 Acute systolic (congestive) heart failure: Secondary | ICD-10-CM | POA: Diagnosis not present

## 2022-09-04 DIAGNOSIS — R4589 Other symptoms and signs involving emotional state: Secondary | ICD-10-CM

## 2022-09-04 DIAGNOSIS — E871 Hypo-osmolality and hyponatremia: Secondary | ICD-10-CM | POA: Diagnosis not present

## 2022-09-04 LAB — TYPE AND SCREEN
ABO/RH(D): O POS
Antibody Screen: NEGATIVE
Unit division: 0
Unit division: 0

## 2022-09-04 LAB — BPAM FFP
Blood Product Expiration Date: 202401202359
Blood Product Expiration Date: 202401202359
ISSUE DATE / TIME: 202401151619
ISSUE DATE / TIME: 202401152110
Unit Type and Rh: 5100
Unit Type and Rh: 5100

## 2022-09-04 LAB — RENAL FUNCTION PANEL
Albumin: 2.3 g/dL — ABNORMAL LOW (ref 3.5–5.0)
Albumin: 2.5 g/dL — ABNORMAL LOW (ref 3.5–5.0)
Anion gap: 13 (ref 5–15)
Anion gap: 12 (ref 5–15)
BUN: 73 mg/dL — ABNORMAL HIGH (ref 8–23)
BUN: 45 mg/dL — ABNORMAL HIGH (ref 8–23)
CO2: 22 mmol/L (ref 22–32)
CO2: 23 mmol/L (ref 22–32)
Calcium: 6.5 mg/dL — ABNORMAL LOW (ref 8.9–10.3)
Calcium: 7.1 mg/dL — ABNORMAL LOW (ref 8.9–10.3)
Chloride: 100 mmol/L (ref 98–111)
Chloride: 101 mmol/L (ref 98–111)
Creatinine, Ser: 3.42 mg/dL — ABNORMAL HIGH (ref 0.61–1.24)
Creatinine, Ser: 2.23 mg/dL — ABNORMAL HIGH (ref 0.61–1.24)
GFR, Estimated: 18 mL/min — ABNORMAL LOW (ref >60)
GFR, Estimated: 31 mL/min — ABNORMAL LOW (ref >60)
Glucose, Bld: 164 mg/dL — ABNORMAL HIGH (ref 70–99)
Glucose, Bld: 152 mg/dL — ABNORMAL HIGH (ref 70–99)
Phosphorus: 4.3 mg/dL (ref 2.5–4.6)
Phosphorus: 2.9 mg/dL (ref 2.5–4.6)
Potassium: 3.4 mmol/L — ABNORMAL LOW (ref 3.5–5.1)
Potassium: 3.5 mmol/L (ref 3.5–5.1)
Sodium: 135 mmol/L (ref 135–145)
Sodium: 136 mmol/L (ref 135–145)

## 2022-09-04 LAB — HEPATIC FUNCTION PANEL
ALT: 1027 U/L — ABNORMAL HIGH (ref 0–44)
AST: 859 U/L — ABNORMAL HIGH (ref 15–41)
Albumin: 2.4 g/dL — ABNORMAL LOW (ref 3.5–5.0)
Alkaline Phosphatase: 93 U/L (ref 38–126)
Bilirubin, Direct: 2.2 mg/dL — ABNORMAL HIGH (ref 0.0–0.2)
Indirect Bilirubin: 1.8 mg/dL — ABNORMAL HIGH (ref 0.3–0.9)
Total Bilirubin: 4 mg/dL — ABNORMAL HIGH (ref 0.3–1.2)
Total Protein: 4.7 g/dL — ABNORMAL LOW (ref 6.5–8.1)

## 2022-09-04 LAB — DIC (DISSEMINATED INTRAVASCULAR COAGULATION)PANEL
D-Dimer, Quant: 8.5 ug/mL-FEU — ABNORMAL HIGH (ref 0.00–0.50)
Fibrinogen: 211 mg/dL (ref 210–475)
INR: 4 — ABNORMAL HIGH (ref 0.8–1.2)
Platelets: 43 10*3/uL — ABNORMAL LOW (ref 150–400)
Prothrombin Time: 38.3 seconds — ABNORMAL HIGH (ref 11.4–15.2)
Smear Review: NONE SEEN
aPTT: 40 seconds — ABNORMAL HIGH (ref 24–36)

## 2022-09-04 LAB — PREPARE FRESH FROZEN PLASMA

## 2022-09-04 LAB — PREPARE CRYOPRECIPITATE
Unit division: 0
Unit division: 0

## 2022-09-04 LAB — MAGNESIUM: Magnesium: 2.3 mg/dL (ref 1.7–2.4)

## 2022-09-04 LAB — BPAM CRYOPRECIPITATE
Blood Product Expiration Date: 202401151935
Blood Product Expiration Date: 202401152259
ISSUE DATE / TIME: 202401151449
ISSUE DATE / TIME: 202401151738
Unit Type and Rh: 5100
Unit Type and Rh: 5100

## 2022-09-04 LAB — GLUCOSE, CAPILLARY
Glucose-Capillary: 137 mg/dL — ABNORMAL HIGH (ref 70–99)
Glucose-Capillary: 138 mg/dL — ABNORMAL HIGH (ref 70–99)
Glucose-Capillary: 141 mg/dL — ABNORMAL HIGH (ref 70–99)
Glucose-Capillary: 146 mg/dL — ABNORMAL HIGH (ref 70–99)
Glucose-Capillary: 147 mg/dL — ABNORMAL HIGH (ref 70–99)
Glucose-Capillary: 151 mg/dL — ABNORMAL HIGH (ref 70–99)
Glucose-Capillary: 155 mg/dL — ABNORMAL HIGH (ref 70–99)

## 2022-09-04 LAB — PROTIME-INR
INR: 3.9 — ABNORMAL HIGH (ref 0.8–1.2)
Prothrombin Time: 38.2 seconds — ABNORMAL HIGH (ref 11.4–15.2)

## 2022-09-04 LAB — CBC
HCT: 24.2 % — ABNORMAL LOW (ref 39.0–52.0)
Hemoglobin: 8.4 g/dL — ABNORMAL LOW (ref 13.0–17.0)
MCH: 30.8 pg (ref 26.0–34.0)
MCHC: 34.7 g/dL (ref 30.0–36.0)
MCV: 88.6 fL (ref 80.0–100.0)
Platelets: 40 10*3/uL — ABNORMAL LOW (ref 150–400)
RBC: 2.73 MIL/uL — ABNORMAL LOW (ref 4.22–5.81)
RDW: 13 % (ref 11.5–15.5)
WBC: 16.4 10*3/uL — ABNORMAL HIGH (ref 4.0–10.5)
nRBC: 1.6 % — ABNORMAL HIGH (ref 0.0–0.2)

## 2022-09-04 LAB — BPAM RBC
Blood Product Expiration Date: 202402192359
Blood Product Expiration Date: 202402192359
ISSUE DATE / TIME: 202401151305
ISSUE DATE / TIME: 202401151305
Unit Type and Rh: 5100
Unit Type and Rh: 5100

## 2022-09-04 LAB — LACTIC ACID, PLASMA
Lactic Acid, Venous: 3.9 mmol/L (ref 0.5–1.9)
Lactic Acid, Venous: 2.1 mmol/L (ref 0.5–1.9)

## 2022-09-04 LAB — CULTURE, BLOOD (ROUTINE X 2)
Culture: NO GROWTH
Special Requests: ADEQUATE

## 2022-09-04 LAB — HEPARIN INDUCED PLATELET AB (HIT ANTIBODY): Heparin Induced Plt Ab: 0.05 OD (ref 0.000–0.400)

## 2022-09-04 MED ORDER — PRISMASOL BGK 4/2.5 32-4-2.5 MEQ/L REPLACEMENT SOLN: Status: DC

## 2022-09-04 MED ORDER — CALCIUM GLUCONATE-NACL 2-0.675 GM/100ML-% IV SOLN
2.0000 g | Freq: Once | INTRAVENOUS | Status: AC
  Administered 2022-09-04: 2000 mg via INTRAVENOUS
  Filled 2022-09-04: qty 100

## 2022-09-04 MED ORDER — OXYCODONE HCL 5 MG PO TABS
5.0000 mg | ORAL_TABLET | Freq: Once | ORAL | Status: AC
  Administered 2022-09-04: 5 mg via NASOGASTRIC
  Filled 2022-09-04: qty 1

## 2022-09-04 MED ORDER — PRISMASOL BGK 4/2.5 32-4-2.5 MEQ/L EC SOLN: Status: DC

## 2022-09-04 MED ORDER — OXYCODONE HCL 5 MG PO TABS: 5.0000 mg | ORAL_TABLET | Freq: Once | ORAL | Status: DC

## 2022-09-04 NOTE — Progress Notes (Signed)
NAME:  Douglas Ballard, MRN:  [id number], DOB:  1950/12/24, LOS: 5 ADMISSION DATE:  09/02/2022, CONSULTATION DATE:  08/22/2022 REFERRING MD:  Dr. Francia Greaves, CHIEF COMPLAINT:  fall   History of Present Illness:  HPI obtained from EMR as patient is encephalopathic and no family able to be reached.   72 year old male with prior hx as below, significant for Afib on Xarelto, HFpEF, CKD IV, and DMT2 presented from home by EMS after unwitnessed fall.  Patient reported to EMS, fell out of bed due to weakness while trying to go the bathroom, denied LOC, and sustained a small laceration to the left temporal area.  Additionally complained of nausea and vomiting for one month in which he has not had evaluated.  Was alert and oriented initially and denied any neck or back pain.   In ER, patient found in afib with RVR, no temperature available yet, normoxia, and SBP 130-150's.  While in ER patient c/o of neck, back, and ankle pain then became more agitated, altered and tremulous with concern for ETOH withdrawal.  Given ativan '1mg'$  twice.  Additionally found to be in Afib with RVR, placed on cardizem gtt. Labs noted for Na 113, Cl 79, sCr 4.5, BUN/ sCr 64/ 5.11 (prior 54/ 3.26 04/2021), bicarb 15, AG 19, glucose 179, AST/ ALT 712/ 553, t. Bili 2.1, BNP 2970, trop hs 36, WBC 19.8, Hgb 10.3, Hct 28, plts 185, neg ETOH level, EKG afib with incomplete RBBB, rate 125, Qtc 579.  CTH and cervical neg for acute fx or intracranial process. CXR showed cardiomegaly with mild pulmonary venous congestion and possible early right perihilar opacity.  XR left/ right ankle neg, lumbar and pelvis neg for fx.  He has received 1L LR in ER.  PCCM called for admit.  Patient remains less talkative after ativan, remains tremulous, and confused.   Pertinent  Medical History  Former smoker, Afib on Xarelto, ETOH use, HTN, HFpEF, CKD IV, DMT2, anemia of chronic disease, colovesical fistula  Significant Hospital Events: Including procedures,  antibiotic start and stop dates in addition to other pertinent events   1/11 admitted  1/12 sodium level improved on 3% saline, bicarb drip started 1/13 3% saline restarted 1/15 developed septic shock, DIC, started on CRRT. HD cath and CVL placed.  Interim History / Subjective:   Patient on precedex overnight Levophed 58mg/min Wife updated at bedside No acute events overnight   Objective   Blood pressure (!) 107/53, pulse (!) 106, temperature 98.7 F (37.1 C), temperature source Axillary, resp. rate 16, height '5\' 9"'$  (1.753 m), weight 77.8 kg, SpO2 97 %.        Intake/Output Summary (Last 24 hours) at 09/04/2022 0730 Last data filed at 09/04/2022 0700 Gross per 24 hour  Intake 3641.56 ml  Output 3143.2 ml  Net 498.36 ml   Filed Weights   08/23/2022 0831 09/04/22 0106  Weight: 72.6 kg 77.8 kg   Examination: General:  ill appearing male, laying in bed HEENT: moist mucous membranes, sclera anicteric, PERRL Neuro:  no alert or oriented, moving all extremities spontaneously CV: irregularly irregular, no murmur PULM:  course breath sounds GI: soft, non-tender Extremities: warm/dry, 1+ edema  Skin: no rashes or lesions  RUQ [place]/30/2024 Unremarkable  Echo 08/21/2022 EF 20 to 25%, global hypokinesis RV systolic function mildly reduced, size moderately enlarged. Mildly elevated PA pressure. LA and RA severely dilated.  Renal [place]1/11/24 Within normal limits  CT Abdomen 09/03/22 Distended fluid filled stomach  Resolved Hospital  Problem list   Hypomagnesemia Hypernatremia Anion Gap Metabolic Acidosis  Assessment & Plan:   Septic Shock due to pneumonia - follow up blood cultures - continue cefepime, flagyl and azithromycin - continue levophed for MAP 65 or greater  Acute metabolic/ toxic encephalopathy ETOH withdrawal ?Uremia - CT Head 1/14 negative - high dose thiamine  - CIWA protocol with PRN ativan - precedex as needed - soft wrist restraints   Acute Hypoxemic  Respiratory Failure CAP vs aspiration PNA Bilateral Pleural Effusions - continue supplemental O2 for SpO2 92% or greater - cefepime, flagyl and azithromycin for antibiotics  - MRSA screen negative 09/12/2022 - aspiration precautions  AKI on CKD stage IV Hypokalemia Hyponatremia Hypocalcemia lactic acidosis - nephrology followin - Continue CRRT - Avoid nephrotoxic agents, ensure adequate renal perfusion - Nephrology following  Acute Systolic Heart Failure Afib with RVR HTN - Prior TTE 02/2020> EF 55-60%, G3DD, normal RV, mild MR - New Echo with EF 20-25% - Cardiology following, no changes recommended - Troponins not significant elevated and no ischemic EKG changes - Continue aggressive electrolyte replacement - TSH within normal limits - goal Mag > 2, K> 4 - Hold Heparin due to DIC - hold holme clonidine, bidil, labetalol for now  DIC Acute blood loss Anemia due GI bleeding Thrombocytopenia - trend CBC/ transfuse for Hgb < 7 or active bleeding  - hold heparin - Follow up HIT ab testing - DIC panel confirmed acute DIC 1/15 - Transfused 2 units PRBCs, 2 units cryo and 2 units FFP on 1/15  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 Abdominal Pain/Distension Elevated LFTs Elevated T. Bilirubin Elevated INR - RUQ [place] normal - CT Abdomen with gastric distension  - NG tube to be placed 1/15 to low intermittent suction. Will place to gravity today - tylenol level not elevated - hepatitis panel negative - monitor INR and LFTs  Prolonged QTC - maximize electrolytes as above and avoid Qtc prolonging meds - tele  DM with hyperglycemia - SSI sensitive with CBG q4  Left temporal scalp laceration s/p staple 2/2 fall  - staple removal in 7-10 days   Best Practice (right click and "Reselect all SmartList Selections" daily)   Diet/type: NPO DVT prophylaxis: not indicated GI prophylaxis: PPI Lines: Central line and Dialysis Catheter Foley:  Yes, and it is still needed Code Status:   full code Last date of multidisciplinary goals of care discussion [pending]  Labs   CBC: Recent Labs  Lab 08/23/2022 0855 08/31/22 0330 09/01/22 2345 09/02/22 1244 09/03/22 0525 09/03/22 0904 09/03/22 1042 09/04/22 0333  WBC 19.8*   < > 15.4* 16.7* 14.5*  --  13.9* 16.4*  NEUTROABS 17.0*  --   --   --   --   --  11.6*  --   HGB 10.3*   < > 10.0* 9.4* 8.3*  --  8.0* 8.4*  HCT 28.3*   < > 28.2* 26.4* 24.1*  --  23.8* 24.2*  MCV 87.3   < > 89.2 89.8 92.3  --  94.8 88.6  PLT 185   < > 85* 66* 54* 60* 61* 43*  40*   < > = values in this interval not displayed.    Basic Metabolic Panel: Recent Labs  Lab 09/12/2022 0855 09/02/2022 1222 08/23/2022 1754 08/20/2022 2258 08/31/22 0330 08/31/22 0753 09/02/22 0841 09/02/22 1244 09/02/22 1835 09/02/22 2037 09/03/22 0232 09/03/22 0525 09/03/22 0529 09/03/22 1555 09/04/22 0333  NA 113*   < > 115*   < > 119*   < >  126*   < > 128*   < > 134* 131* 132* 135 135  K 4.5  --   --   --  3.8   < > 5.1  --  4.8  --   --   --  5.9* 5.5* 3.4*  CL 79*  --   --   --  85*   < > 92*  --  95*  --   --   --  96* 98 100  CO2 15*  --   --   --  14*   < > 11*  --  11*  --   --   --  10* 9* 22  GLUCOSE 179*  --   --   --  72   < > 120*  --  120*  --   --   --  125* 185* 164*  BUN 64*  --   --   --  71*   < > 85*  --  91*  --   --   --  99* 78* 73*  CREATININE 5.11*  --   --   --  5.43*   < > 5.95*  --  6.14*  --   --   --  6.38* 5.63* 3.42*  CALCIUM 4.9*  --   --   --  4.6*   < > 6.8*  --  6.6*  --   --   --  6.4* 6.1* 6.5*  MG 0.7*  --  1.4*  --  2.1  --   --   --  2.0  --   --   --   --   --  2.3  PHOS 5.3*  --   --   --   --   --  7.4*  --   --   --   --   --  9.6* 8.7* 4.3   < > = values in this interval not displayed.   GFR: Estimated Creatinine Clearance: 19.8 mL/min (A) (by C-G formula based on SCr of 3.42 mg/dL (H)). Recent Labs  Lab 09/11/2022 1222 09/17/2022 1648 09/02/22 1244 09/03/22 0525 09/03/22 1037 09/03/22 1042 09/03/22 1555  09/03/22 1839 09/03/22 2255 09/04/22 0333  PROCALCITON 0.39  --   --   --   --   --  0.90  --   --   --   WBC  --    < > 16.7* 14.5*  --  13.9*  --   --   --  16.4*  LATICACIDVEN  --    < >  --   --  >9.0*  --   --  5.7* 3.9* 2.1*   < > = values in this interval not displayed.    Liver Function Tests: Recent Labs  Lab 09/06/2022 0855 08/31/22 0330 09/01/22 2345 09/02/22 0841 09/03/22 0529 09/03/22 1042 09/03/22 1555 09/04/22 0333  AST 712* 1,370* 718*  --   --  2,030*  --  859*  ALT 553* 856* 914*  --   --  1,576*  --  1,027*  ALKPHOS 75 69 128*  --   --  113  --  93  BILITOT 2.1* 1.4* 2.4*  --   --  3.4*  --  4.0*  PROT 6.9 5.8* 6.4*  --   --  5.5*  --  4.7*  ALBUMIN 3.1* 2.6* 2.9* 2.6* 2.5* 2.6* 2.5* 2.4*  2.3*   Recent Labs  Lab 09/14/2022 1222 09/03/22 1042  LIPASE 49 81*   Recent Labs  Lab 09/02/2022 1131 09/03/22 1555  AMMONIA 26 89*    ABG    Component Value Date/Time   HCO3 12.3 (L) 09/03/2022 1555   TCO2 23 06/26/2011 0946   ACIDBASEDEF 13.4 (H) 09/03/2022 1555   O2SAT 71.5 09/03/2022 1555     Coagulation Profile: Recent Labs  Lab 09/17/2022 1336 08/31/22 0330 09/03/22 0904 09/03/22 1042 09/04/22 0333  INR 4.2* 2.5* 6.8* 6.8* 4.0*  3.9*    Cardiac Enzymes: No results for input(s): "CKTOTAL", "CKMB", "CKMBINDEX", "TROPONINI" in the last 168 hours.  HbA1C: Hgb A1c MFr Bld  Date/Time Value Ref Range Status  05/16/2021 11:47 AM 6.8 (H) 4.6 - 6.5 % Final    Comment:    Glycemic Control Guidelines for People with Diabetes:Non Diabetic:  <6%Goal of Therapy: <7%Additional Action Suggested:  >8%   03/14/2020 10:16 AM 5.9 (H) 4.8 - 5.6 % Final    Comment:    (NOTE) Pre diabetes:          5.7%-6.4%  Diabetes:              >6.4%  Glycemic control for   <7.0% adults with diabetes     CBG: Recent Labs  Lab 09/03/22 1240 09/03/22 1624 09/03/22 1932 09/04/22 0019 09/04/22 0335  GLUCAP 146* 170* 165* 151* 146*    Critical care time: 40  mins     Freda Jackson, MD [facility] Office: [phone]   See Amion for personal pager PCCM on call pager [phone] until 7pm. Please call Elink 7p-7a. [phone]

## 2022-09-04 NOTE — Progress Notes (Signed)
Chaplain engaged in an initial visit with Rajat's sister and brother at the bedside while he was resting.  Chaplain provided reflective listening as they shared about Enmanuel's healthcare journey and life.  Ryzen is the youngest of the three children, and he was described as being really smart as a child and very sensitive to his emotions.  Family was candid about Markice's alcohol abuse issues and detailed that a lot of things changed for Maddax around having to retire early.  Chaplain assessed that finances, lack of meaning, and daily changes could have served as a catalyst for what family described as "depression."  Family noticed Varnell becoming more angry and physically fragile.  When asked if there is any history of mental illness in their family, Galo's siblings noted that it existed for their mom.    Sister shared that she and Jatavian were baptized together, but that he has not been connected to a spiritual community recently.  Chaplain offered a prayer over [place] with family's permission.  Brailen's siblings are remaining hopeful and preparing themselves.      Chaplain provided presence, listening, and support.  Chaplain will follow-up with Ammiel's wife tomorrow.     09/04/22 1357  Spiritual Encounters  Type of Visit Initial  Care provided to: Pt and family  Reason for visit Routine spiritual support  Spiritual Framework  Presenting Themes Significant life change;Goals in life/care;Coping tools  Community/Connection Family  Patient Stress Factors Exhausted;Health changes;Loss of control;Major life changes;Lack of knowledge  Family Stress Factors Lack of knowledge;Loss of control;Health changes  Interventions  Spiritual Care Interventions Made Prayer;Compassionate presence;Reflective listening;Established relationship of care and support  Intervention Outcomes  Outcomes Connection to spiritual care;Connection to values and goals of care;Awareness of support

## 2022-09-04 NOTE — Progress Notes (Signed)
[facility] Progress Note   Assessment/ Plan:   Assessment/ Plan: AKI on CKD4 - b/l creatinine 3.7- 4.0 from sept - dec 2023, eGFR 15- 58m/min. Creat here 5.1 on admission in setting of AMS, hyponatremia, etoh w/d. Started CRRT 1/15, changed to 4K bath this AM.  No heparin, sodium citrate locks Hyponatremia - euvolemic vs possibly hypervolemic (CXR mild changes). Could have beer potomania (low solute). UNa low and UOsm mid-range. Na+ was 114 on presentation. SP 3% saline from 1/11- 11/13. TSH and cortisol wnl. Na+ dropped after 3% stopped. Was going to try salt tabs and fluid restriction for poss SIADH  but Na+ dropped again so 3% saline was restarted by CCM. Expect to correct all the way with CRRT Shock: likely septic: on broad-spectrum antibiotics/ pressors DIC: per primary Hypocalcemia - aggressive replacement HTN - was on 3 BP lowering meds at home. BP's here are stable to high. Getting metoprolol 25 bid.   Etoh abuse - per pmd AMS - multifactorial due to low Na, etoh w/d, uremia HFrEF - echo showing LVEF 25-30% H/o CVA   Subjective:    Better on CRRT.  Changed to 4K bath this AM.  Siblings at bedside.     Objective:   BP (!) 98/58   Pulse (!) 107   Temp 98.3 F (36.8 C) (Axillary)   Resp 15   Ht '5\' 9"'$  (1.753 m)   Wt 77.8 kg   SpO2 98%   BMI 25.33 kg/m   Intake/Output Summary (Last 24 hours) at 09/04/2022 1354 Last data filed at 09/04/2022 1300 Gross per 24 hour  Intake 3728.92 ml  Output 3902.3 ml  Net -173.38 ml   Weight change:   Physical Exam: [id number] appears more comfortable this AM CVS: tachy Resp:tachypneic Abd: + fluid wave Ext: 2+ anasarca, improving ACCESS: R IJ nontunneled HD cath  Imaging: DG CHEST PORT 1 VIEW  Result Date: 09/03/2022 CLINICAL DATA:  Central line placement. EXAM: PORTABLE CHEST 1 VIEW COMPARISON:  09/03/2022 FINDINGS: Right IJ central venous catheter has tip over the SVC. Left IJ venous catheter extends to  the region of the SVC where tip then turns superior for 1.5 cm and may be within the azygous vein versus superiorly oriented within the SVC. NG tube has tip over the stomach in the left upper quadrant. There is hazy infrahilar/bibasilar opacification without significant change which may be due to interstitial edema versus infection. Possible small amount left pleural fluid unchanged. No pneumothorax. Cardiomediastinal silhouette and remainder of the exam is unchanged. IMPRESSION: 1. Hazy infrahilar/bibasilar opacification without significant change which may be due to interstitial edema versus infection. Possible small amount left pleural fluid unchanged. 2. Tubes and lines as described. Electronically Signed   By: DMarin OlpM.D.   On: 09/03/2022 15:24   DG Abd 1 View  Addendum Date: 09/03/2022   ADDENDUM REPORT: 09/03/2022 12:23 ADDENDUM: Critical Value/emergent results were called by telephone at the time of interpretation on 09/03/2022 at 12:23 pm to [facility], who verbally acknowledged these results. Electronically Signed   By: EMisty StanleyM.D.   On: 09/03/2022 12:23   Result Date: 09/03/2022 CLINICAL DATA:  NG tube placement. EXAM: ABDOMEN - 1 VIEW COMPARISON:  Earlier same day. FINDINGS: 1206 hours. The NG tube tip follows a course consistent with placement in the left mainstem bronchus. There is no evidence of hiatal hernia on CT scan performed earlier today to account for the course of the NG tube. Right IJ central  line again noted. Basilar airspace disease with left effusion noted in the lower chest. IMPRESSION: NG tube tip follows a course consistent with placement in the left mainstem bronchus. Tube should be removed and replaced. Electronically Signed: By: Misty Stanley M.D. On: 09/03/2022 12:19   DG CHEST PORT 1 VIEW  Result Date: 09/03/2022 CLINICAL DATA:  Central line placement EXAM: PORTABLE CHEST 1 VIEW COMPARISON:  09/03/2022, 8:24 a.m. FINDINGS: Interval placement of  right neck multi lumen vascular catheter, tip near the superior cavoatrial junction. Otherwise unchanged AP portable chest radiograph featuring cardiomegaly, diffuse bilateral interstitial pulmonary opacity, and probable layering left pleural effusion. No focal airspace opacity. IMPRESSION: 1. Interval placement of right neck multi lumen vascular catheter, tip near the superior cavoatrial junction. 2. Otherwise unchanged AP portable chest radiograph featuring cardiomegaly, diffuse bilateral interstitial pulmonary opacity, and probable layering left pleural effusion. No new or focal airspace opacity. Electronically Signed   By: Delanna Ahmadi M.D.   On: 09/03/2022 12:01   CT ABDOMEN PELVIS WO CONTRAST  Result Date: 09/03/2022 CLINICAL DATA:  Encephalopathic.  Abdominal pain.  Fall. EXAM: CT ABDOMEN AND PELVIS WITHOUT CONTRAST TECHNIQUE: Multidetector CT imaging of the abdomen and pelvis was performed following the standard protocol without IV contrast. RADIATION DOSE REDUCTION: This exam was performed according to the departmental dose-optimization program which includes automated exposure control, adjustment of the mA and/or kV according to patient size and/or use of iterative reconstruction technique. COMPARISON:  None FINDINGS: Lower chest: Patchy foci of ground-glass density within the RIGHT middle lobe and RIGHT lower lobe. Bilateral small effusions. Hepatobiliary: No focal hepatic lesion on noncontrast exam. Gallbladder normal. Pancreas: Pancreas is normal. No ductal dilatation. No pancreatic inflammation. Spleen: Normal spleen Adrenals/urinary tract: Adrenal glands and kidneys normal. Foley catheter and gas within bladder Stomach/Bowel: Stomach is distended with fluid. Small bowel is normal without evidence distension. The colon and rectosigmoid colon are normal. Vascular/Lymphatic: Abdominal aorta is normal caliber with atherosclerotic calcification. There is no retroperitoneal or periportal  lymphadenopathy. No pelvic lymphadenopathy. Reproductive: Post prostatectomy. Other: No free fluid. Musculoskeletal: No aggressive osseous lesion. Anasarca subcutaneous tissues of the flank. IMPRESSION: 1. No evidence of trauma in the abdomen pelvis. 2. Patchy ground-glass density in the RIGHT middle lobe and RIGHT lower lobe could represent pulmonary infection or edema. 3. Small bilateral effusions. 4. Stomach distended with fluid.  No evidence of bowel obstruction. 5. Foley catheter in place. 6. Subcutaneous anasarca 7.  Aortic Atherosclerosis (ICD10-I70.0). Electronically Signed   By: Suzy Bouchard M.D.   On: 09/03/2022 10:39   DG CHEST PORT 1 VIEW  Result Date: 09/03/2022 CLINICAL DATA:  Altered mental status. History of atrial fibrillation, hypertension and pneumonia. EXAM: PORTABLE CHEST 1 VIEW COMPARISON:  09/17/2022; 03/14/2020 FINDINGS: Grossly unchanged enlarged cardiac silhouette and mediastinal contours with atherosclerotic plaque within the thoracic aorta. Worsening slightly nodular airspace opacities within the peripheral aspect of the right mid lung. Worsening bilateral infrahilar opacities. Trace bilateral effusions are not excluded. No pneumothorax. No acute osseous abnormalities. Old left seventh rib fracture. IMPRESSION: Worsening right mid lung and bilateral infrahilar heterogeneous opacities, potentially atelectasis though multifocal infection, including atypical etiologies, could have a similar appearance. Further evaluation with a PA and lateral chest radiograph may be obtained as clinically indicated. Electronically Signed   By: Sandi Mariscal M.D.   On: 09/03/2022 08:48   CT HEAD WO CONTRAST (5MM)  Result Date: 09/03/2022 CLINICAL DATA:  Mental status change, unknown cause EXAM: CT HEAD WITHOUT CONTRAST TECHNIQUE: Contiguous axial images  were obtained from the base of the skull through the vertex without intravenous contrast. RADIATION DOSE REDUCTION: This exam was performed  according to the departmental dose-optimization program which includes automated exposure control, adjustment of the mA and/or kV according to patient size and/or use of iterative reconstruction technique. COMPARISON:  CT head 09/10/2022 FINDINGS: Brain: Patchy and confluent areas of decreased attenuation are noted throughout the deep and periventricular white matter of the cerebral hemispheres bilaterally, compatible with chronic microvascular ischemic disease. No evidence of large-territorial acute infarction. No parenchymal hemorrhage. No mass lesion. No extra-axial collection. No mass effect or midline shift. No hydrocephalus. Basilar cisterns are patent. Vascular: No hyperdense vessel. Skull: No acute fracture or focal lesion. Sinuses/Orbits: Paranasal sinuses and mastoid air cells are clear. The orbits are unremarkable. Other: None. IMPRESSION: No acute intracranial abnormality. Electronically Signed   By: Iven Finn M.D.   On: 09/03/2022 01:23    Labs: BMET Recent Labs  Lab 08/29/2022 0855 09/11/2022 1222 09/01/22 1412 09/01/22 1829 09/01/22 2345 09/02/22 0841 09/02/22 1244 09/02/22 1835 09/02/22 2037 09/03/22 0232 09/03/22 0525 09/03/22 0529 09/03/22 1555 09/04/22 0333  NA 113*   < > 121*   < > 125*  125* 126*   < > 128* 133* 134* 131* 132* 135 135  K 4.5   < > 4.6  --  4.5 5.1  --  4.8  --   --   --  5.9* 5.5* 3.4*  CL 79*   < > 85*  --  92* 92*  --  95*  --   --   --  96* 98 100  CO2 15*   < > 15*  --  13* 11*  --  11*  --   --   --  10* 9* 22  GLUCOSE 179*   < > 150*  --  128* 120*  --  120*  --   --   --  125* 185* 164*  BUN 64*   < > 78*  --  86* 85*  --  91*  --   --   --  99* 78* 73*  CREATININE 5.11*   < > 5.57*  --  5.79* 5.95*  --  6.14*  --   --   --  6.38* 5.63* 3.42*  CALCIUM 4.9*   < > 8.0*  --  7.2* 6.8*  --  6.6*  --   --   --  6.4* 6.1* 6.5*  PHOS 5.3*  --   --   --   --  7.4*  --   --   --   --   --  9.6* 8.7* 4.3   < > = values in this interval not displayed.    CBC Recent Labs  Lab 09/05/2022 0855 08/31/22 0330 09/02/22 1244 09/03/22 0525 09/03/22 0904 09/03/22 1042 09/04/22 0333  WBC 19.8*   < > 16.7* 14.5*  --  13.9* 16.4*  NEUTROABS 17.0*  --   --   --   --  11.6*  --   HGB 10.3*   < > 9.4* 8.3*  --  8.0* 8.4*  HCT 28.3*   < > 26.4* 24.1*  --  23.8* 24.2*  MCV 87.3   < > 89.8 92.3  --  94.8 88.6  PLT 185   < > 66* 54* 60* 61* 43*  40*   < > = values in this interval not displayed.    Medications:     sodium chloride  Intravenous Once   sodium chloride   Intravenous Once   sodium chloride   Intravenous Once   Chlorhexidine Gluconate Cloth  6 each Topical Daily   insulin aspart  0-9 Units Subcutaneous Q4H   ipratropium-albuterol  3 mL Nebulization TID   pantoprazole (PROTONIX) IV  40 mg Intravenous Q12H   sodium chloride  2 g Oral TID WC   sodium zirconium cyclosilicate  10 g Oral Once    Madelon Lips MD 09/04/2022, 1:54 PM

## 2022-09-04 NOTE — Progress Notes (Signed)
Rounding Note    Patient Name: Douglas Ballard Date of Encounter: 09/04/2022  [place] Cardiologist: Peter Martinique, MD   Subjective   Patient laying in bed, lethargic/sleepy but will open eyes to voice.   Inpatient Medications    Scheduled Meds:  sodium chloride   Intravenous Once   sodium chloride   Intravenous Once   sodium chloride   Intravenous Once   Chlorhexidine Gluconate Cloth  6 each Topical Daily   insulin aspart  0-9 Units Subcutaneous Q4H   ipratropium-albuterol  3 mL Nebulization TID   pantoprazole (PROTONIX) IV  40 mg Intravenous Q12H   sodium chloride  2 g Oral TID WC   sodium zirconium cyclosilicate  10 g Oral Once   Continuous Infusions:  anticoagulant sodium citrate     azithromycin Stopped (09/03/22 1243)   ceFEPime (MAXIPIME) IV Stopped (09/03/22 2244)   dexmedetomidine (PRECEDEX) IV infusion 0.5 mcg/kg/hr (09/04/22 0700)   metronidazole Stopped (09/04/22 0005)   norepinephrine (LEVOPHED) Adult infusion 2 mcg/min (09/04/22 0700)   prismasol BGK 2/2.5 dialysis solution 2,000 mL/hr at 09/04/22 0724   prismasol BGK 2/2.5 replacement solution 400 mL/hr at 09/04/22 0225   prismasol BGK 2/2.5 replacement solution 400 mL/hr at 09/04/22 0225   thiamine (VITAMIN B1) injection Stopped (09/03/22 1310)   vasopressin Stopped (09/03/22 1845)   PRN Meds: anticoagulant sodium citrate, docusate sodium, LORazepam, mouth rinse, polyethylene glycol, sodium chloride   Vital Signs    Vitals:   09/04/22 0600 09/04/22 0615 09/04/22 0630 09/04/22 0645  BP: (!) 118/54 121/66 119/82 (!) 107/53  Pulse: (!) 112 (!) 109 (!) 112 (!) 106  Resp: '14 16 16 16  '$ Temp:      TempSrc:      SpO2: 97% 95% 96% 97%  Weight:      Height:        Intake/Output Summary (Last 24 hours) at 09/04/2022 0744 Last data filed at 09/04/2022 0700 Gross per 24 hour  Intake 3641.56 ml  Output 3143.2 ml  Net 498.36 ml      09/04/2022    1:06 AM 08/22/2022    8:31 AM 04/20/2022     8:46 AM  Last 3 Weights  Weight (lbs) 171 lb 8.3 oz 160 lb 158 lb 3.2 oz  Weight (kg) 77.8 kg 72.576 kg 71.759 kg      Telemetry    Atrial fibrillation with rates 100-120 - Personally Reviewed  ECG    No new tracing  Physical Exam   GEN: Ill appearing, opens eyes to voice and attempts to speak   Neck: No JVD Cardiac: irregularly irregular, no murmurs, rubs, or gallops.  Respiratory: diffuse rhonchi in all anterior fields GI: Soft, nontender, non-distended  MS: 1-2+ bilateral lower extremity edema Neuro:  Opens eyes to voice. Will squeeze hand on request Psych: unable to assess  Labs    High Sensitivity Troponin:   Recent Labs  Lab 08/25/2022 0855 08/22/2022 1130  TROPONINIHS 36* 37*     Chemistry Recent Labs  Lab 08/31/22 0330 08/31/22 0753 09/01/22 2345 09/02/22 0841 09/02/22 1835 09/02/22 2037 09/03/22 0529 09/03/22 1042 09/03/22 1555 09/04/22 0333  NA 119*   < > 125*  125*   < > 128*   < > 132*  --  135 135  K 3.8   < > 4.5   < > 4.8  --  5.9*  --  5.5* 3.4*  CL 85*   < > 92*   < > 95*  --  96*  --  98 100  CO2 14*   < > 13*   < > 11*  --  10*  --  9* 22  GLUCOSE 72   < > 128*   < > 120*  --  125*  --  185* 164*  BUN 71*   < > 86*   < > 91*  --  99*  --  78* 73*  CREATININE 5.43*   < > 5.79*   < > 6.14*  --  6.38*  --  5.63* 3.42*  CALCIUM 4.6*   < > 7.2*   < > 6.6*  --  6.4*  --  6.1* 6.5*  MG 2.1  --   --   --  2.0  --   --   --   --  2.3  PROT 5.8*  --  6.4*  --   --   --   --  5.5*  --  4.7*  ALBUMIN 2.6*  --  2.9*   < >  --   --  2.5* 2.6* 2.5* 2.4*  2.3*  AST 1,370*  --  718*  --   --   --   --  2,030*  --  859*  ALT 856*  --  914*  --   --   --   --  1,576*  --  1,027*  ALKPHOS 69  --  128*  --   --   --   --  113  --  93  BILITOT 1.4*  --  2.4*  --   --   --   --  3.4*  --  4.0*  GFRNONAA 11*   < > 10*   < > 9*  --  9*  --  10* 18*  ANIONGAP 20*   < > 20*   < > 22*  --  26*  --  28* 13   < > = values in this interval not displayed.    Lipids  No results for input(s): "CHOL", "TRIG", "HDL", "LABVLDL", "LDLCALC", "CHOLHDL" in the last 168 hours.  Hematology Recent Labs  Lab 09/03/22 0525 09/03/22 0904 09/03/22 1042 09/04/22 0333  WBC 14.5*  --  13.9* 16.4*  RBC 2.61*  --  2.51* 2.73*  HGB 8.3*  --  8.0* 8.4*  HCT 24.1*  --  23.8* 24.2*  MCV 92.3  --  94.8 88.6  MCH 31.8  --  31.9 30.8  MCHC 34.4  --  33.6 34.7  RDW 12.7  --  13.1 13.0  PLT 54* 60* 61* 43*  40*   Thyroid  Recent Labs  Lab 08/31/22 0330  TSH 0.846    BNP Recent Labs  Lab 08/23/2022 0855  BNP 2,970.3*    DDimer  Recent Labs  Lab 09/03/22 0904 09/04/22 0333  DDIMER 7.44* 8.50*     Radiology    DG CHEST PORT 1 VIEW  Result Date: 09/03/2022 CLINICAL DATA:  Central line placement. EXAM: PORTABLE CHEST 1 VIEW COMPARISON:  09/03/2022 FINDINGS: Right IJ central venous catheter has tip over the SVC. Left IJ venous catheter extends to the region of the SVC where tip then turns superior for 1.5 cm and may be within the azygous vein versus superiorly oriented within the SVC. NG tube has tip over the stomach in the left upper quadrant. There is hazy infrahilar/bibasilar opacification without significant change which may be due to interstitial edema versus infection. Possible small amount left pleural fluid unchanged. No  pneumothorax. Cardiomediastinal silhouette and remainder of the exam is unchanged. IMPRESSION: 1. Hazy infrahilar/bibasilar opacification without significant change which may be due to interstitial edema versus infection. Possible small amount left pleural fluid unchanged. 2. Tubes and lines as described. Electronically Signed   By: Marin Olp M.D.   On: 09/03/2022 15:24   DG Abd 1 View  Addendum Date: 09/03/2022   ADDENDUM REPORT: 09/03/2022 12:23 ADDENDUM: Critical Value/emergent results were called by telephone at the time of interpretation on 09/03/2022 at 12:23 pm to provider [facility] , who verbally acknowledged these results.  Electronically Signed   By: Misty Stanley M.D.   On: 09/03/2022 12:23   Result Date: 09/03/2022 CLINICAL DATA:  NG tube placement. EXAM: ABDOMEN - 1 VIEW COMPARISON:  Earlier same day. FINDINGS: 1206 hours. The NG tube tip follows a course consistent with placement in the left mainstem bronchus. There is no evidence of hiatal hernia on CT scan performed earlier today to account for the course of the NG tube. Right IJ central line again noted. Basilar airspace disease with left effusion noted in the lower chest. IMPRESSION: NG tube tip follows a course consistent with placement in the left mainstem bronchus. Tube should be removed and replaced. Electronically Signed: By: Misty Stanley M.D. On: 09/03/2022 12:19   DG CHEST PORT 1 VIEW  Result Date: 09/03/2022 CLINICAL DATA:  Central line placement EXAM: PORTABLE CHEST 1 VIEW COMPARISON:  09/03/2022, 8:24 a.m. FINDINGS: Interval placement of right neck multi lumen vascular catheter, tip near the superior cavoatrial junction. Otherwise unchanged AP portable chest radiograph featuring cardiomegaly, diffuse bilateral interstitial pulmonary opacity, and probable layering left pleural effusion. No focal airspace opacity. IMPRESSION: 1. Interval placement of right neck multi lumen vascular catheter, tip near the superior cavoatrial junction. 2. Otherwise unchanged AP portable chest radiograph featuring cardiomegaly, diffuse bilateral interstitial pulmonary opacity, and probable layering left pleural effusion. No new or focal airspace opacity. Electronically Signed   By: Delanna Ahmadi M.D.   On: 09/03/2022 12:01   CT ABDOMEN PELVIS WO CONTRAST  Result Date: 09/03/2022 CLINICAL DATA:  Encephalopathic.  Abdominal pain.  Fall. EXAM: CT ABDOMEN AND PELVIS WITHOUT CONTRAST TECHNIQUE: Multidetector CT imaging of the abdomen and pelvis was performed following the standard protocol without IV contrast. RADIATION DOSE REDUCTION: This exam was performed according to the  departmental dose-optimization program which includes automated exposure control, adjustment of the mA and/or kV according to patient size and/or use of iterative reconstruction technique. COMPARISON:  None FINDINGS: Lower chest: Patchy foci of ground-glass density within the RIGHT middle lobe and RIGHT lower lobe. Bilateral small effusions. Hepatobiliary: No focal hepatic lesion on noncontrast exam. Gallbladder normal. Pancreas: Pancreas is normal. No ductal dilatation. No pancreatic inflammation. Spleen: Normal spleen Adrenals/urinary tract: Adrenal glands and kidneys normal. Foley catheter and gas within bladder Stomach/Bowel: Stomach is distended with fluid. Small bowel is normal without evidence distension. The colon and rectosigmoid colon are normal. Vascular/Lymphatic: Abdominal aorta is normal caliber with atherosclerotic calcification. There is no retroperitoneal or periportal lymphadenopathy. No pelvic lymphadenopathy. Reproductive: Post prostatectomy. Other: No free fluid. Musculoskeletal: No aggressive osseous lesion. Anasarca subcutaneous tissues of the flank. IMPRESSION: 1. No evidence of trauma in the abdomen pelvis. 2. Patchy ground-glass density in the RIGHT middle lobe and RIGHT lower lobe could represent pulmonary infection or edema. 3. Small bilateral effusions. 4. Stomach distended with fluid.  No evidence of bowel obstruction. 5. Foley catheter in place. 6. Subcutaneous anasarca 7.  Aortic Atherosclerosis (ICD10-I70.0). Electronically Signed  By: Suzy Bouchard M.D.   On: 09/03/2022 10:39   DG CHEST PORT 1 VIEW  Result Date: 09/03/2022 CLINICAL DATA:  Altered mental status. History of atrial fibrillation, hypertension and pneumonia. EXAM: PORTABLE CHEST 1 VIEW COMPARISON:  08/25/2022; 03/14/2020 FINDINGS: Grossly unchanged enlarged cardiac silhouette and mediastinal contours with atherosclerotic plaque within the thoracic aorta. Worsening slightly nodular airspace opacities within the  peripheral aspect of the right mid lung. Worsening bilateral infrahilar opacities. Trace bilateral effusions are not excluded. No pneumothorax. No acute osseous abnormalities. Old left seventh rib fracture. IMPRESSION: Worsening right mid lung and bilateral infrahilar heterogeneous opacities, potentially atelectasis though multifocal infection, including atypical etiologies, could have a similar appearance. Further evaluation with a PA and lateral chest radiograph may be obtained as clinically indicated. Electronically Signed   By: Sandi Mariscal M.D.   On: 09/03/2022 08:48   CT HEAD WO CONTRAST (5MM)  Result Date: 09/03/2022 CLINICAL DATA:  Mental status change, unknown cause EXAM: CT HEAD WITHOUT CONTRAST TECHNIQUE: Contiguous axial images were obtained from the base of the skull through the vertex without intravenous contrast. RADIATION DOSE REDUCTION: This exam was performed according to the departmental dose-optimization program which includes automated exposure control, adjustment of the mA and/or kV according to patient size and/or use of iterative reconstruction technique. COMPARISON:  CT head 09/10/2022 FINDINGS: Brain: Patchy and confluent areas of decreased attenuation are noted throughout the deep and periventricular white matter of the cerebral hemispheres bilaterally, compatible with chronic microvascular ischemic disease. No evidence of large-territorial acute infarction. No parenchymal hemorrhage. No mass lesion. No extra-axial collection. No mass effect or midline shift. No hydrocephalus. Basilar cisterns are patent. Vascular: No hyperdense vessel. Skull: No acute fracture or focal lesion. Sinuses/Orbits: Paranasal sinuses and mastoid air cells are clear. The orbits are unremarkable. Other: None. IMPRESSION: No acute intracranial abnormality. Electronically Signed   By: Iven Finn M.D.   On: 09/03/2022 01:23    Cardiac Studies   08/26/2022 TTE  IMPRESSIONS     1. Left ventricular  ejection fraction, by estimation, is 20 to 25%. The  left ventricle has severely decreased function. The left ventricle  demonstrates global hypokinesis. The left ventricular internal cavity size  was mildly dilated. There is mild left  ventricular hypertrophy. Left ventricular diastolic function could not be  evaluated. Elevated left atrial pressure.   2. Right ventricular systolic function is mildly reduced. The right  ventricular size is moderately enlarged. There is mildly elevated  pulmonary artery systolic pressure.   3. Left atrial size was severely dilated.   4. Right atrial size was severely dilated.   5. The mitral valve is normal in structure. Mild mitral valve  regurgitation. No evidence of mitral stenosis.   6. Tricuspid valve regurgitation is mild to moderate.   7. The aortic valve is tricuspid. Aortic valve regurgitation is trivial.  No aortic stenosis is present.   8. The inferior vena cava is normal in size with greater than 50%  respiratory variability, suggesting right atrial pressure of 3 mmHg.   FINDINGS   Left Ventricle: Left ventricular ejection fraction, by estimation, is 20  to 25%. The left ventricle has severely decreased function. The left  ventricle demonstrates global hypokinesis. Definity contrast agent was  given IV to delineate the left  ventricular endocardial borders. The left ventricular internal cavity size  was mildly dilated. There is mild left ventricular hypertrophy. Left  ventricular diastolic function could not be evaluated due to atrial  fibrillation. Left ventricular diastolic  function could not be evaluated. Elevated left atrial pressure.   Right Ventricle: The right ventricular size is moderately enlarged. Right  ventricular systolic function is mildly reduced. There is mildly elevated  pulmonary artery systolic pressure. The tricuspid regurgitant velocity is  3.05 m/s, and with an assumed  right atrial pressure of 3 mmHg, the  estimated right ventricular systolic  pressure is 31.5 mmHg.   Left Atrium: Left atrial size was severely dilated.   Right Atrium: Right atrial size was severely dilated.   Pericardium: Trivial pericardial effusion is present.   Mitral Valve: The mitral valve is normal in structure. Mild mitral annular  calcification. Mild mitral valve regurgitation. No evidence of mitral  valve stenosis.   Tricuspid Valve: The tricuspid valve is normal in structure. Tricuspid  valve regurgitation is mild to moderate. No evidence of tricuspid  stenosis.   Aortic Valve: The aortic valve is tricuspid. Aortic valve regurgitation is  trivial. Aortic regurgitation PHT measures 394 msec. No aortic stenosis is  present. Aortic valve mean gradient measures 2.6 mmHg. Aortic valve peak  gradient measures 4.8 mmHg.  Aortic valve area, by VTI measures 2.14 cm.   Pulmonic Valve: The pulmonic valve was normal in structure. Pulmonic valve  regurgitation is trivial. No evidence of pulmonic stenosis.   Aorta: The aortic root is normal in size and structure.   Venous: The inferior vena cava is normal in size with greater than 50%  respiratory variability, suggesting right atrial pressure of 3 mmHg.   IAS/Shunts: No atrial level shunt detected by color flow Doppler.   Patient Profile     Douglas Ballard is a 72 y.o. male with a hx of persistent atrial fibrillation, CKD stage IV, alcohol abuse, tobacco abuse, diabetes who is being seen for the evaluation of new onset systolic heart failure at the request of Freda Jackson, MD. Patient was admitted on 08/29/2022 with altered mental status and encephalopathy. Apparently the patient fell at home (unwitnessed) and was brought to the ED by EMS after he was unable to mobilize following the fall. Patient has been sick for the last month. In the ED, noted with new afib/RVR. He also demonstrated symptoms concerning for alcohol withdrawal and had labs significant for acute liver  failure.  Assessment & Plan    Atrial fibrillation with RVR  Patient admitted with acute alcohol withdrawal, aspiration pneumonia, septic shock found with afib/RVR.   Patient remains critically ill, is now on Levophed for BP support. Continue to suspect afib 2/2 significant illness. Initially on metoprolol tartrate which has now been discontinued due to low BP. Rates are relatively stable without rate controlling agents at this time. If RVR recurs, amiodarone would be preferred anti-arrhythmic/rate control agent. Goal HR <120bpm.  Hypokalemic this morning. Maintain K>4, Mg>2.0 Continue to hold heparin with acutely low platelets/low HGB with acute sepsis. Thrombocytopenia worse today, 61->40.  Acute systolic heart failure  Patient noted to have LVEF 20-25%, global hypokinesis on TTE this admission. 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 BP decreased yesterday and patient was initiated on Levophed and Vasopressin (now on Levophed only).   Suspect low BP is primarily due to septic shock but there may be a component of  cardiogenic shock. Lactic acid now improving with Levophed/improved perfusion. Continue volume management with hemodialysis in the setting of acute renal failure. No ACEi/ARB/ARNI/MRA given acute AKI Strongly support palliative care involvement   Per primary team: AKI Acute liver failure Septic shock Aspiration pneumonia  ETOH withdrawal Anemia Thrombocytopenia      For questions or updates, please contact [place] Please consult [email or web address] for contact info under        Signed, Lily Kocher, PA-C  09/04/2022, 7:44 AM

## 2022-09-04 NOTE — Progress Notes (Signed)
Patient asking for something for pai.[place] Progress Note Patient Name: Vic Esco DOB: 09-Apr-1951 MRN: [id number]   Date of Service  09/04/2022  HPI/Events of Note  Patient asking for something for pain, he has liver and renal dysfunction.  eICU Interventions  Oxycodone 5 mg po x 1 ordered.        Frederik Pear 09/04/2022, 9:52 PM

## 2022-09-04 NOTE — Progress Notes (Signed)
Heart Failure Navigator Progress Note  Following this hospitalization to assess for HV TOC readiness.   EF 20-25% Patient started on CRRT, watch renal function.   Earnestine Leys, BSN, Clinical cytogeneticist Only

## 2022-09-04 NOTE — Progress Notes (Signed)
.   Transition of Care [facility]) Screening Note   Patient Details  Name: Douglas Ballard Date of Birth: Oct 16, 1950   Transition of [facility]) CM/SW Contact:    Illene Regulus, LCSW Phone Number: 09/04/2022, 9:11 AM    Transition of Care Department [facility]) has reviewed patient and no TOC needs have been identified at this time. We will continue to monitor patient advancement through interdisciplinary progression rounds. If new patient transition needs arise, please place a TOC consult.

## 2022-09-04 NOTE — Consult Note (Addendum)
Consultation Note Date: 09/04/2022   Patient Name: Douglas Ballard  DOB: 1950-09-10  MRN: [id number]  Age / Sex: 72 y.o., male   PCP: Vivi Barrack, MD Referring Physician: Freddi Starr, MD  Reason for Consultation: Establishing goals of care     Chief Complaint/History of Present Illness:   Patient is a 72 year old male with a past medical history of A-fib on Xarelto, CHF, CKD stage IV, diabetes mellitus type 2, alcoholism, and prior tobacco use who was admitted on 09/03/2022 for management after unwitnessed fall.  Patient found to be altered upon admission.  During hospitalization, patient required transfer to ICU for management of septic shock due to pneumonia requiring pressor support as well as AKI on CKD requiring CRRT.  Patient also had repeat echo which is showing EF now 25-25%.  Nephrology and cardiology following along with patient's course.  Medicine team consulted to assist with complex medical decision making.  Extensive review of EMR prior to seeing patient.  Also discussed PCCM provider Dr. [name].  Presented to bedside and introduced myself to family present.  Patient laying in bed unresponsive to voice; on Precedex.  Patient's family present at bedside included his wife, Douglas Ballard, his daughter, and his sister.  Introduced myself and the role of the palliative medicine team.  Spent time learning about patient's medical journey up until this point.  Family able to describe the seriousness of patient's medical conditions as they have heard he is in multisystem organ failure.  They are unsure if patient's kidneys will recover or if he would still continue to require dialysis.  Acknowledged this and empathized with difficult situation. Family able to describe what patient was like prior to admission.  Noted that since patient retired a few years ago, has become very withdrawn and "a hermit".  He also stated that patient downplays his alcoholism.  His normal routine at home is to  watch movies and begin drinking at noon or earlier during the day.  They shared that patient has been incredibly resistant to aggressive medical care such as even coming to the hospital.  They noted patient would follow-up with his nephrologist and cardiologist though patient did not always discuss in detail the conversations he had with his care providers with family.   After learning about the patient some, able to discuss goals for medical care moving forward.  Family is hopeful that patient can "overcome" this.  They would want patient to go to rehab and then inpatient therapy for alcohol use.  Discussed hoping for that while planning for the worst as patient is still incredibly ill with lots of medical conditions. With permission, able to discuss what kind of medical care patient would want for himself.  Able to discuss CODE STATUS with family.  Wife, daughter, and sister stated that patient has mentioned he would "never want to be kept alive on machines" such as a ventilator.  Discussed concern that if patient is ill enough that his heart stops or he stops breathing, interventions such as chest compressions and putting him on a ventilator would not allow him to return to the quality of life he would find acceptable.  Family acknowledged this.  All family present agreed that patient would want to be DNR.  Before changing CODE STATUS though, wife would like to discuss with patient's brother, Douglas Ballard.  Noted they could inform any provider about change of CODE STATUS once that discussion has occurred with Douglas Ballard.  Spent time providing emotional support via active listening.  Wife appropriately tearful during conversation.  Primary Diagnoses  Present on Admission:  Hyponatremia   Palliative Review of Systems: Unable to obtain from patient secondary to medical status.   Past Medical History:  Diagnosis Date   Alcohol withdrawal seizure ([place]) 06/28/2011   Alcohol withdraw seizure prior to admission is  suspected from history given by family & Post ictal appearance in the ED.    Anxiety    Atrial fibrillation ([place])    Atypical mole 05/13/2008   LEFT MEDIAL CHEST SLIGHT/MOD.   Atypical mole 10/12/2020   Left Upper Arm-Posterior (moderate to severe)   Atypical mole 10/12/2020   Right Mid Back (Moderate)   Atypical nevi 05/13/2008   LEFT LATERAL CHEST SLIGHT   Atypical nevi 04/29/2015   RIGHT POST SHOULDER MOD/SEV. [place] W/S   Atypical nevi 04/29/2015   RIGHT MID ABDOMEN MODERATE [place] W/S   Atypical nevi 09/27/2015   LEFT UPPER ARM SEVERE TX W/S   Atypical nevi 09/27/2015   MID LOWER BACK MODERATE   Atypical nevi 09/27/2015   MID UPPER BACK SEVERE TX EXC   Atypical nevi 09/27/2015   LEFT UPPER BACK MOD.SEV TX EXC   Atypical nevi 03/23/2016   RIGHT LOWER BACK MILD   Atypical nevi 03/23/2016   LEFT SIDE TORSO MILD   Atypical nevi 06/14/2016   LEFT LOWER BACK MOD/SEV    Atypical nevi 06/14/2016   LEFT CHEST MODERATE   Atypical nevi 08/30/2017   MID UPPER BACK SUP MODERATE   Atypical nevi 08/30/2017   RIGHT CHEST SEVERE    Atypical nevi 07/25/2018   LEFT SHOULDER ANTERIOR TX WIDERSHAVE   Atypical nevi 07/25/2018   LEFT SHOULDER POST MODERATE   Atypical nevi 08/25/2019   LEFT SIDE ABDOMEN MODERATE FREE   Atypical nevi 11/27/2019   RIGHT SHOULDER MODERATE TX W/S   Atypical nevi 11/27/2019   RIGHT UPPER BACK MOD/SEVERE TX W/S   Atypical nevi 11/27/2019   LEFT MID BACK SEVERE TX W/S   Cancer ([place]) 2012   Prostate surgery   Chronic diastolic CHF (congestive heart failure) ([place]) 11/01/2014   Echo 8/15: Mild LVH, EF 50-55%, moderate BAE  //  b. Echo 7/17: EF 55-60%, normal wall motion, grade 2 diastolic dysfunction, MAC, mild MR, moderate LAE, trivial PI   Chronic kidney disease    Compression fracture of thoracic vertebra ([place]) 06/26/2011   Diabetes mellitus type 2 in nonobese (HCC)    Fistula, bladder    Frequent urination at night    History of adenomatous polyp of colon  06/14/2014   History of nuclear stress test    a. Myoview 10/15: Overall Impression:  Low risk stress nuclear study demonstrating mild baseline ST-T changes with normal myocardial perfusion and low normal EF of 50%. // b.Myoview 7/17: EF 50%, no ischemia or scar, low risk (EF normal by recent echo)   Hypertension    PNA (pneumonia) 06/26/2011   Sepsis due to urinary tract infection ([place]) 04/16/2014   Stroke ([place]) sept 1, 2015   tia x 3   Social History   Socioeconomic History   Marital status: Married    Spouse name: Not on file   Number of children: 2   Years of education: Not on file   Highest education level: Not on file  Occupational History   Occupation: Press photographer and Proofreader  Tobacco Use   Smoking status: Former    Packs/day: 2.00    Years: 25.00    Total pack years: 50.00  Types: Cigarettes    Quit date: 04/14/2014    Years since quitting: 8.3   Smokeless tobacco: Never  Vaping Use   Vaping Use: Never used  Substance and Sexual Activity   Alcohol use: Yes    Alcohol/week: 7.0 - 10.0 standard drinks of alcohol    Types: 7 - 10 Standard drinks or equivalent per week    Comment: 2 bottles of wine with wife a week; cocktail every other night   Drug use: No   Sexual activity: Not Currently  Other Topics Concern   Not on file  Social History Narrative   Fun/Hobby: Walk, cards, gardening    Social Determinants of Health   Financial Resource Strain: Low Risk  (09/25/2021)   Overall Financial Resource Strain (CARDIA)    Difficulty of Paying Living Expenses: Not hard at all  Food Insecurity: No Food Insecurity (09/25/2021)   Hunger Vital Sign    Worried About Running Out of Food in the Last Year: Never true    Ran Out of Food in the Last Year: Never true  Transportation Needs: No Transportation Needs (09/25/2021)   PRAPARE - Hydrologist (Medical): No    Lack of Transportation (Non-Medical): No  Physical Activity: Sufficiently Active  (09/25/2021)   Exercise Vital Sign    Days of Exercise per Week: 6 days    Minutes of Exercise per Session: 30 min  Stress: No Stress Concern Present (09/25/2021)   [place]    Feeling of Stress : Not at all  Social Connections: Moderately Isolated (09/25/2021)   Social Connection and Isolation Panel [NHANES]    Frequency of Communication with Friends and Family: More than three times a week    Frequency of Social Gatherings with Friends and Family: More than three times a week    Attends Religious Services: Never    Marine scientist or Organizations: No    Attends Archivist Meetings: Never    Marital Status: Married   Family History  Problem Relation Age of Onset   Atrial fibrillation Father        onset 19s. Had pacemaker placed for sinus pause   Prostate cancer Father    Breast cancer Mother    Lung cancer Mother    Leukemia Brother    Colon polyps Brother    Colon cancer Neg Hx    Diabetes Neg Hx    Scheduled Meds:  sodium chloride   Intravenous Once   sodium chloride   Intravenous Once   sodium chloride   Intravenous Once   Chlorhexidine Gluconate Cloth  6 each Topical Daily   insulin aspart  0-9 Units Subcutaneous Q4H   ipratropium-albuterol  3 mL Nebulization TID   pantoprazole (PROTONIX) IV  40 mg Intravenous Q12H   sodium chloride  2 g Oral TID WC   sodium zirconium cyclosilicate  10 g Oral Once   Continuous Infusions:   prismasol BGK 4/2.5      prismasol BGK 4/2.5     anticoagulant sodium citrate     azithromycin Stopped (09/03/22 1243)   ceFEPime (MAXIPIME) IV Stopped (09/03/22 2244)   dexmedetomidine (PRECEDEX) IV infusion 0.3 mcg/kg/hr (09/04/22 0800)   metronidazole Stopped (09/04/22 0005)   norepinephrine (LEVOPHED) Adult infusion 2 mcg/min (09/04/22 0800)   prismasol BGK 4/2.5     thiamine (VITAMIN B1) injection Stopped (09/03/22 1310)   vasopressin Stopped (09/03/22 1845)    PRN Meds:.anticoagulant  sodium citrate, docusate sodium, LORazepam, mouth rinse, polyethylene glycol, sodium chloride Allergies  Allergen Reactions   Hydrochlorothiazide Other (See Comments)    Pt gets hyponatremia   Lasix [Furosemide] Other (See Comments)    Sodium levels drop when take   Nsaids Other (See Comments)    Kidney disease/failure   Sulfa Antibiotics Other (See Comments)    headaches   CBC:    Component Value Date/Time   WBC 16.4 (H) 09/04/2022 0333   HGB 8.4 (L) 09/04/2022 0333   HCT 24.2 (L) 09/04/2022 0333   PLT 40 (L) 09/04/2022 0333   PLT 43 (L) 09/04/2022 0333   MCV 88.6 09/04/2022 0333   NEUTROABS 11.6 (H) 09/03/2022 1042   LYMPHSABS 0.3 (L) 09/03/2022 1042   MONOABS 1.6 (H) 09/03/2022 1042   EOSABS 0.0 09/03/2022 1042   BASOSABS 0.0 09/03/2022 1042   Comprehensive Metabolic Panel:    Component Value Date/Time   NA 135 09/04/2022 0333   NA 134 (A) 10/24/2020 0000   K 3.4 (L) 09/04/2022 0333   CL 100 09/04/2022 0333   CO2 22 09/04/2022 0333   BUN 73 (H) 09/04/2022 0333   BUN 52 (A) 10/24/2020 0000   CREATININE 3.42 (H) 09/04/2022 0333   GLUCOSE 164 (H) 09/04/2022 0333   CALCIUM 6.5 (L) 09/04/2022 0333   AST 859 (H) 09/04/2022 0333   ALT 1,027 (H) 09/04/2022 0333   ALKPHOS 93 09/04/2022 0333   BILITOT 4.0 (H) 09/04/2022 0333   PROT 4.7 (L) 09/04/2022 0333   ALBUMIN 2.3 (L) 09/04/2022 0333   ALBUMIN 2.4 (L) 09/04/2022 0333    Physical Exam: Vital Signs: BP (!) 107/53   Pulse (!) 106   Temp (!) 97.4 F (36.3 C) (Axillary) Comment: nurse notified  Resp 16   Ht _0  (1.753 m)   Wt 77.8 kg   SpO2 97%   BMI 25.33 kg/m  SpO2: SpO2: 97 % O2 Device: O2 Device: Nasal Cannula O2 Flow Rate: O2 Flow Rate (L/min): 3 L/min Intake/output summary:  Intake/Output Summary (Last 24 hours) at 09/04/2022 8828 Last data filed at 09/04/2022 0800 Gross per 24 hour  Intake 3730.37 ml  Output 3293.3 ml  Net 437.07 ml   LBM: Last BM Date :  09/03/22 Baseline Weight: Weight: 72.6 kg Most recent weight: Weight: 77.8 kg  General: Chronically ill-appearing, on sedation, laying in bed Eyes: No drainage noted HENT: NG tube in place Cardiovascular: Tachycardia noted, edema present in LE b/l Respiratory: no increased work of breathing noted, not in respiratory distress Abdomen: Soft Skin: no rashes or lesions on visible skin Neuro: not moving to voice or touch           Palliative Performance Scale: 10%               Additional Data Reviewed: Recent Labs    09/03/22 1042 09/03/22 1555 09/04/22 0333  WBC 13.9*  --  16.4*  HGB 8.0*  --  8.4*  PLT 61*  --  43*  40*  NA  --  135 135  BUN  --  78* 73*  CREATININE  --  5.63* 3.42*    Imaging: DG CHEST PORT 1 VIEW CLINICAL DATA:  Central line placement.  EXAM: PORTABLE CHEST 1 VIEW  COMPARISON:  09/03/2022  FINDINGS: Right IJ central venous catheter has tip over the SVC. Left IJ venous catheter extends to the region of the SVC where tip then turns superior for 1.5 cm and may be within the azygous vein  versus superiorly oriented within the SVC. NG tube has tip over the stomach in the left upper quadrant. There is hazy infrahilar/bibasilar opacification without significant change which may be due to interstitial edema versus infection. Possible small amount left pleural fluid unchanged. No pneumothorax. Cardiomediastinal silhouette and remainder of the exam is unchanged.  IMPRESSION: 1. Hazy infrahilar/bibasilar opacification without significant change which may be due to interstitial edema versus infection. Possible small amount left pleural fluid unchanged. 2. Tubes and lines as described.  Electronically Signed   By: Marin Olp M.D.   On: 09/03/2022 15:24 DG Abd 1 View Addendum: ADDENDUM REPORT: 09/03/2022 12:23   ADDENDUM:  Critical Value/emergent results were called by telephone at the time  of interpretation on 09/03/2022 at 12:23 pm to provider  [facility] , who verbally acknowledged these results.   Electronically Signed    By: Misty Stanley M.D.    On: 09/03/2022 12:23 Narrative: CLINICAL DATA:  NG tube placement.  EXAM: ABDOMEN - 1 VIEW  COMPARISON:  Earlier same day.  FINDINGS: 1206 hours. The NG tube tip follows a course consistent with placement in the left mainstem bronchus. There is no evidence of hiatal hernia on CT scan performed earlier today to account for the course of the NG tube. Right IJ central line again noted. Basilar airspace disease with left effusion noted in the lower chest.  IMPRESSION: NG tube tip follows a course consistent with placement in the left mainstem bronchus. Tube should be removed and replaced.  Electronically Signed: By: Misty Stanley M.D. On: 09/03/2022 12:19 DG CHEST PORT 1 VIEW CLINICAL DATA:  Central line placement  EXAM: PORTABLE CHEST 1 VIEW  COMPARISON:  09/03/2022, 8:24 a.m.  FINDINGS: Interval placement of right neck multi lumen vascular catheter, tip near the superior cavoatrial junction. Otherwise unchanged AP portable chest radiograph featuring cardiomegaly, diffuse bilateral interstitial pulmonary opacity, and probable layering left pleural effusion. No focal airspace opacity.  IMPRESSION: 1. Interval placement of right neck multi lumen vascular catheter, tip near the superior cavoatrial junction. 2. Otherwise unchanged AP portable chest radiograph featuring cardiomegaly, diffuse bilateral interstitial pulmonary opacity, and probable layering left pleural effusion. No new or focal airspace opacity.  Electronically Signed   By: Delanna Ahmadi M.D.   On: 09/03/2022 12:01 CT ABDOMEN PELVIS WO CONTRAST CLINICAL DATA:  Encephalopathic.  Abdominal pain.  Fall.  EXAM: CT ABDOMEN AND PELVIS WITHOUT CONTRAST  TECHNIQUE: Multidetector CT imaging of the abdomen and pelvis was performed following the standard protocol without IV contrast.  RADIATION  DOSE REDUCTION: This exam was performed according to the departmental dose-optimization program which includes automated exposure control, adjustment of the mA and/or kV according to patient size and/or use of iterative reconstruction technique.  COMPARISON:  None  FINDINGS: Lower chest: Patchy foci of ground-glass density within the RIGHT middle lobe and RIGHT lower lobe. Bilateral small effusions.  Hepatobiliary: No focal hepatic lesion on noncontrast exam. Gallbladder normal.  Pancreas: Pancreas is normal. No ductal dilatation. No pancreatic inflammation.  Spleen: Normal spleen  Adrenals/urinary tract: Adrenal glands and kidneys normal. Foley catheter and gas within bladder  Stomach/Bowel: Stomach is distended with fluid. Small bowel is normal without evidence distension. The colon and rectosigmoid colon are normal.  Vascular/Lymphatic: Abdominal aorta is normal caliber with atherosclerotic calcification. There is no retroperitoneal or periportal lymphadenopathy. No pelvic lymphadenopathy.  Reproductive: Post prostatectomy.  Other: No free fluid.  Musculoskeletal: No aggressive osseous lesion. Anasarca subcutaneous tissues of the flank.  IMPRESSION: 1. No  evidence of trauma in the abdomen pelvis. 2. Patchy ground-glass density in the RIGHT middle lobe and RIGHT lower lobe could represent pulmonary infection or edema. 3. Small bilateral effusions. 4. Stomach distended with fluid.  No evidence of bowel obstruction. 5. Foley catheter in place. 6. Subcutaneous anasarca 7.  Aortic Atherosclerosis (ICD10-I70.0).  Electronically Signed   By: Suzy Bouchard M.D.   On: 09/03/2022 10:39 DG CHEST PORT 1 VIEW CLINICAL DATA:  Altered mental status. History of atrial fibrillation, hypertension and pneumonia.  EXAM: PORTABLE CHEST 1 VIEW  COMPARISON:  08/28/2022; 03/14/2020  FINDINGS: Grossly unchanged enlarged cardiac silhouette and mediastinal contours with  atherosclerotic plaque within the thoracic aorta. Worsening slightly nodular airspace opacities within the peripheral aspect of the right mid lung. Worsening bilateral infrahilar opacities. Trace bilateral effusions are not excluded. No pneumothorax. No acute osseous abnormalities. Old left seventh rib fracture.  IMPRESSION: Worsening right mid lung and bilateral infrahilar heterogeneous opacities, potentially atelectasis though multifocal infection, including atypical etiologies, could have a similar appearance. Further evaluation with a PA and lateral chest radiograph may be obtained as clinically indicated.  Electronically Signed   By: Sandi Mariscal M.D.   On: 09/03/2022 08:48 CT HEAD WO CONTRAST (5MM) CLINICAL DATA:  Mental status change, unknown cause  EXAM: CT HEAD WITHOUT CONTRAST  TECHNIQUE: Contiguous axial images were obtained from the base of the skull through the vertex without intravenous contrast.  RADIATION DOSE REDUCTION: This exam was performed according to the departmental dose-optimization program which includes automated exposure control, adjustment of the mA and/or kV according to patient size and/or use of iterative reconstruction technique.  COMPARISON:  CT head 09/08/2022  FINDINGS: Brain:  Patchy and confluent areas of decreased attenuation are noted throughout the deep and periventricular white matter of the cerebral hemispheres bilaterally, compatible with chronic microvascular ischemic disease. No evidence of large-territorial acute infarction. No parenchymal hemorrhage. No mass lesion. No extra-axial collection.  No mass effect or midline shift. No hydrocephalus. Basilar cisterns are patent.  Vascular: No hyperdense vessel.  Skull: No acute fracture or focal lesion.  Sinuses/Orbits: Paranasal sinuses and mastoid air cells are clear. The orbits are unremarkable.  Other: None.  IMPRESSION: No acute intracranial  abnormality.  Electronically Signed   By: Iven Finn M.D.   On: 09/03/2022 01:23    I personally reviewed recent imaging.   Palliative Care Assessment and Plan Summary of Established Goals of Care and Medical Treatment Preferences   Patient is a 72 year old male with a past medical history of A-fib on Xarelto, CHF, CKD stage IV, diabetes mellitus type 2, alcoholism, and prior tobacco use who was admitted on 08/27/2022 for management after unwitnessed fall.  Patient found to be altered upon admission.  During hospitalization, patient required transfer to ICU for management of septic shock due to pneumonia requiring pressor support as well as AKI on CKD requiring CRRT.  Patient also had repeat echo which is showing EF now 25-25%.  Nephrology and cardiology following along with patient's course.  Medicine team consulted to assist with complex medical decision making.  # Complex medical decision making/goals of care  -Patient unable to participate in complex medical decision making due to medical status.  -Spent extensive time discussing care with patient's wife, daughter, and sister at bedside as described above in HPI.  Patient is an active alcoholic which has greatly affected his psychosocial and medical situation.  While family is hopeful patient can return to his previously functional status, they have also heard that  patient is incredibly ill.  -With permission, able to discuss the idea of patient's CODE STATUS.  All family present during conversation stated that patient has noted he would never want to be kept alive on machines and so if he was sick enough that his heart were to stop or he were to stop breathing, family does not believe he would want CPR or to be placed on a ventilator.  Discussed difference between full code and DNR with continuing appropriate medical therapies.  Family leaning towards CODE STATUS change to DNR though want to confirm with patient's brother, Douglas Ballard, when he  presents later on today before officially changing status.  Informed family they can let any provider know about change of CODE STATUS to appropriately updated to DNR, continue appropriate medical therapies.  # Symptom management  -As per primary team   # Psycho-social/Spiritual Support:  - Support System: Wife, daughter, sister, brother - Desire for further Chaplain support: Yes, placed consult   # Discharge Planning:  To Be Determined  Thank you for allowing the palliative care team to participate in the care [name].  Chelsea Aus, DO Palliative Care Provider PMT # [phone]  If patient remains symptomatic despite maximum doses, please call PMT at [phone] between 0700 and 1900. Outside of these hours, please call attending, as PMT does not have night coverage.   This provider spent a total of 82 minutes providing patient's care.  Includes review of EMR, discussing care with other staff members involved in patient's medical care, obtaining relevant history and information from patient and/or patient's family, and personal review of imaging and lab work. Greater than 50% of the time was spent counseling and coordinating care related to the above assessment and plan.    UPDATE: Informed later in day patient's brother arrived. All family, including patient's wife, agree patient would not want cardiac resuscitation or to be placed on a ventilator. Code status appropriately changed to DNR, continue appropriate medical therapies.

## 2022-09-04 NOTE — Progress Notes (Signed)
[place] Progress Note Patient Name: Douglas Ballard DOB: [birth year] MRN: [id number]   Date of Service  09/04/2022  HPI/Events of Note  pt is on CRRT; potassium 3.4, calcium 6.5, cret 3.42, GFR 18. If giving potassium, pt has central line  eICU Interventions  Nephrology team to look into it.      Intervention Category Minor Interventions: Electrolytes abnormality - evaluation and management  Elmer Sow 09/04/2022, 6:02 AM

## 2022-09-05 DIAGNOSIS — D65 Disseminated intravascular coagulation [defibrination syndrome]: Secondary | ICD-10-CM | POA: Diagnosis not present

## 2022-09-05 DIAGNOSIS — A419 Sepsis, unspecified organism: Secondary | ICD-10-CM

## 2022-09-05 DIAGNOSIS — I4821 Permanent atrial fibrillation: Secondary | ICD-10-CM | POA: Diagnosis not present

## 2022-09-05 DIAGNOSIS — I5021 Acute systolic (congestive) heart failure: Secondary | ICD-10-CM | POA: Diagnosis not present

## 2022-09-05 DIAGNOSIS — R6521 Severe sepsis with septic shock: Secondary | ICD-10-CM | POA: Diagnosis not present

## 2022-09-05 DIAGNOSIS — Z515 Encounter for palliative care: Secondary | ICD-10-CM | POA: Diagnosis not present

## 2022-09-05 DIAGNOSIS — F10930 Alcohol use, unspecified with withdrawal, uncomplicated: Secondary | ICD-10-CM | POA: Diagnosis not present

## 2022-09-05 DIAGNOSIS — E440 Moderate protein-calorie malnutrition: Secondary | ICD-10-CM | POA: Insufficient documentation

## 2022-09-05 DIAGNOSIS — Z7189 Other specified counseling: Secondary | ICD-10-CM | POA: Diagnosis not present

## 2022-09-05 DIAGNOSIS — N179 Acute kidney failure, unspecified: Secondary | ICD-10-CM | POA: Diagnosis not present

## 2022-09-05 LAB — RENAL FUNCTION PANEL
Albumin: 2.3 g/dL — ABNORMAL LOW (ref 3.5–5.0)
Albumin: 2.4 g/dL — ABNORMAL LOW (ref 3.5–5.0)
Anion gap: 12 (ref 5–15)
Anion gap: 9 (ref 5–15)
BUN: 38 mg/dL — ABNORMAL HIGH (ref 8–23)
BUN: 33 mg/dL — ABNORMAL HIGH (ref 8–23)
CO2: 21 mmol/L — ABNORMAL LOW (ref 22–32)
CO2: 23 mmol/L (ref 22–32)
Calcium: 7.1 mg/dL — ABNORMAL LOW (ref 8.9–10.3)
Calcium: 7.3 mg/dL — ABNORMAL LOW (ref 8.9–10.3)
Chloride: 103 mmol/L (ref 98–111)
Chloride: 103 mmol/L (ref 98–111)
Creatinine, Ser: 1.98 mg/dL — ABNORMAL HIGH (ref 0.61–1.24)
Creatinine, Ser: 1.56 mg/dL — ABNORMAL HIGH (ref 0.61–1.24)
GFR, Estimated: 35 mL/min — ABNORMAL LOW (ref >60)
GFR, Estimated: 47 mL/min — ABNORMAL LOW
Glucose, Bld: 152 mg/dL — ABNORMAL HIGH (ref 70–99)
Glucose, Bld: 135 mg/dL — ABNORMAL HIGH (ref 70–99)
Phosphorus: 2.6 mg/dL (ref 2.5–4.6)
Phosphorus: 2.4 mg/dL — ABNORMAL LOW (ref 2.5–4.6)
Potassium: 3.6 mmol/L (ref 3.5–5.1)
Potassium: 3.8 mmol/L (ref 3.5–5.1)
Sodium: 136 mmol/L (ref 135–145)
Sodium: 135 mmol/L (ref 135–145)

## 2022-09-05 LAB — HEPATIC FUNCTION PANEL
ALT: 669 U/L — ABNORMAL HIGH (ref 0–44)
AST: 354 U/L — ABNORMAL HIGH (ref 15–41)
Albumin: 2.2 g/dL — ABNORMAL LOW (ref 3.5–5.0)
Alkaline Phosphatase: 94 U/L (ref 38–126)
Bilirubin, Direct: 2.8 mg/dL — ABNORMAL HIGH (ref 0.0–0.2)
Indirect Bilirubin: 2 mg/dL — ABNORMAL HIGH (ref 0.3–0.9)
Total Bilirubin: 4.8 mg/dL — ABNORMAL HIGH (ref 0.3–1.2)
Total Protein: 4.5 g/dL — ABNORMAL LOW (ref 6.5–8.1)

## 2022-09-05 LAB — CBC
HCT: 24.3 % — ABNORMAL LOW (ref 39.0–52.0)
Hemoglobin: 8.3 g/dL — ABNORMAL LOW (ref 13.0–17.0)
MCH: 31.2 pg (ref 26.0–34.0)
MCHC: 34.2 g/dL (ref 30.0–36.0)
MCV: 91.4 fL (ref 80.0–100.0)
Platelets: 26 10*3/uL — CL (ref 150–400)
RBC: 2.66 MIL/uL — ABNORMAL LOW (ref 4.22–5.81)
RDW: 13.6 % (ref 11.5–15.5)
WBC: 15.6 10*3/uL — ABNORMAL HIGH (ref 4.0–10.5)
nRBC: 1.4 % — ABNORMAL HIGH (ref 0.0–0.2)

## 2022-09-05 LAB — GLUCOSE, CAPILLARY
Glucose-Capillary: 132 mg/dL — ABNORMAL HIGH (ref 70–99)
Glucose-Capillary: 128 mg/dL — ABNORMAL HIGH (ref 70–99)
Glucose-Capillary: 120 mg/dL — ABNORMAL HIGH (ref 70–99)
Glucose-Capillary: 129 mg/dL — ABNORMAL HIGH (ref 70–99)
Glucose-Capillary: 135 mg/dL — ABNORMAL HIGH (ref 70–99)
Glucose-Capillary: 133 mg/dL — ABNORMAL HIGH (ref 70–99)
Glucose-Capillary: 133 mg/dL — ABNORMAL HIGH (ref 70–99)

## 2022-09-05 LAB — MAGNESIUM: Magnesium: 2.3 mg/dL (ref 1.7–2.4)

## 2022-09-05 MED ORDER — ADULT MULTIVITAMIN W/MINERALS CH
1.0000 | ORAL_TABLET | Freq: Every day | ORAL | Status: DC
  Administered 2022-09-05 – 2022-09-07 (×3): 1
  Filled 2022-09-05 (×3): qty 1

## 2022-09-05 MED ORDER — VITAL HIGH PROTEIN PO LIQD
1000.0000 mL | ORAL | Status: DC
  Administered 2022-09-05: 1000 mL

## 2022-09-05 MED ORDER — PROSOURCE TF20 ENFIT COMPATIBL EN LIQD
60.0000 mL | Freq: Every day | ENTERAL | Status: DC
  Administered 2022-09-06 – 2022-09-09 (×3): 60 mL
  Filled 2022-09-05 (×4): qty 60

## 2022-09-05 MED ORDER — FENTANYL CITRATE PF 50 MCG/ML IJ SOSY
12.5000 ug | PREFILLED_SYRINGE | Freq: Four times a day (QID) | INTRAMUSCULAR | Status: DC | PRN
  Administered 2022-09-05 – 2022-09-06 (×3): 25 ug via INTRAVENOUS
  Filled 2022-09-05 (×3): qty 1

## 2022-09-05 MED ORDER — CHLORHEXIDINE GLUCONATE CLOTH 2 % EX PADS
6.0000 | MEDICATED_PAD | Freq: Every day | CUTANEOUS | Status: DC
  Administered 2022-09-05 – 2022-09-06 (×2): 6 via TOPICAL

## 2022-09-05 MED ORDER — ORAL CARE MOUTH RINSE
15.0000 mL | OROMUCOSAL | Status: DC
  Administered 2022-09-05 – 2022-09-09 (×15): 15 mL via OROMUCOSAL

## 2022-09-05 MED ORDER — VITAL 1.5 CAL PO LIQD
1000.0000 mL | ORAL | Status: DC
  Administered 2022-09-05 – 2022-09-06 (×3): 1000 mL
  Filled 2022-09-05 (×2): qty 1000

## 2022-09-05 MED ORDER — ORAL CARE MOUTH RINSE: 15.0000 mL | OROMUCOSAL | Status: DC | PRN

## 2022-09-05 MED ORDER — SODIUM CHLORIDE 0.9 % IV SOLN
100.0000 mg | Freq: Two times a day (BID) | INTRAVENOUS | Status: DC
  Administered 2022-09-05 – 2022-09-06 (×2): 100 mg via INTRAVENOUS
  Filled 2022-09-05 (×2): qty 100

## 2022-09-05 NOTE — Plan of Care (Signed)
Patient continues on CRRT, labs trending, supportive family at bedside.

## 2022-09-05 NOTE — Progress Notes (Addendum)
Nutrition Follow-up  DOCUMENTATION CODES:   Non-severe (moderate) malnutrition in context of chronic illness  INTERVENTION:  - Per CCM plan to start TF today. Concerned for refeeding syndrome so starting at 46m/hr and advancing by only 124mQ12H.  Discussed plan with RN.  Initiate tube feeding via NG: Vital 1.5 at 55 ml/h (1320 ml per day) Prosource TF20 60 ml daily *Start at 2079mr and advance by 59m41m2H to goal Provides 2060 kcal, 109 gm protein, 1008 ml free water daily  - Free water flushes per MD/nephrology.  - Monitor magnesium, potassium, and phosphorus BID for at least 3 days, MD to replete as needed, as pt is at risk for refeeding syndrome given etoh use, sister report patient eating poorly PTA, and minimal intake since admission 6 days ago . - Strongly recommend '100mg'$  thiamine x5 days due to risk of refeeding syndrome.  - Will order daily multivitamin due to history or etoh use.   - Recommend folic acid for same.   - Monitor weight trends.    NUTRITION DIAGNOSIS:   Moderate Malnutrition related to catabolic illness, chronic illness (CKD IV, ETOH use) as evidenced by moderate fat depletion, moderate muscle depletion.  GOAL:   Patient will meet greater than or equal to 90% of their needs  MONITOR:   Diet advancement, Labs, Weight trends, TF tolerance  REASON FOR ASSESSMENT:   Consult Enteral/tube feeding initiation and management  ASSESSMENT:   71 y72r old male with prior hx significant for ETOH use, HTN, HFpEF, CKD IV, and DMT2 presented from home by EMS after unwitnessed fall.   1/11 Admit 1/15 CRRT started, NG placed for LIS 1/17 starting TF  Patient not oriented or alert at time of visit. Sister at bedside. She is unsure of a UBW but feels he has lost some weight recently. Per EMR no changes in weight over the past year however weights this admission have fluctuated as high as 172#. Will utilize first weight as it is more consistent with previous  history. 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 Did not eat anything 3 days PTA.  No intake documented since admission but patient has been confused so suspect inadequate intake.  Plan to start tube feeds today. Sister had no questions.  Concerned for refeeding syndrome, starting at 20mL39mand advancing by 59mL 63m to goal of 55mL/h22mDiscussed plan with RN.   Medications reviewed and include: -  Labs reviewed:  Creatinine 1.98 HA1C 6.8 Blood Glucose 128-155 x24 hours   NUTRITION - FOCUSED PHYSICAL EXAM:  Flowsheet Row Most Recent Value  Orbital Region Moderate depletion  Upper Arm Region Mild depletion  Thoracic and Lumbar Region Mild depletion  Buccal Region Moderate depletion  Temple Region Moderate depletion  Clavicle Bone Region Mild depletion  Clavicle and Acromion Bone Region Mild depletion  Scapular Bone Region Unable to assess  Dorsal Hand Mild depletion  Patellar Region Moderate depletion  Anterior Thigh Region Moderate depletion  Posterior Calf Region Moderate depletion  Edema (RD Assessment) Mild  Hair Reviewed  Eyes Reviewed  Mouth Reviewed  Skin Reviewed  Nails Reviewed       Diet Order:   Diet Order             Diet NPO time specified  Diet effective now                   EDUCATION NEEDS:   Not appropriate for  education at this time  Skin:  Skin Assessment: Reviewed RN Assessment  Last BM:  1/15  Height:  Ht Readings from Last 1 Encounters:  09/02/2022 '5\' 9"'$  (1.753 m)   Weight:  08/25/2022 72.6 kg    BMI:  Body mass index is 23.62 kg/m.  Estimated Nutritional Needs:  Kcal:  2050-2200 kcals Protein:  100-115 grams Fluid:  >/= 2L    Samson Frederic RD, LDN For contact information, refer to [facility].

## 2022-09-05 NOTE — Progress Notes (Signed)
Notified Dr Augustin Coupe that CRRT is off due to machine malfunction, we are waiting for a different machine from [facility]. Labs discussed.  Resume orders when CRRTmachine arrives.

## 2022-09-05 NOTE — Progress Notes (Signed)
NAME:  Douglas Ballard, MRN:  [id number], DOB:  1950/12/14, LOS: 6 ADMISSION DATE:  08/20/2022, CONSULTATION DATE:  09/16/2022 REFERRING MD:  Dr. Francia Greaves, CHIEF COMPLAINT:  fall   History of Present Illness:   72 year old male with prior hx as below, significant for Afib on Xarelto, HFpEF, CKD IV, and DMT2 presented from home by EMS after unwitnessed fall.  Patient reported to EMS, fell out of bed due to weakness while trying to go the bathroom, denied LOC, and sustained a small laceration to the left temporal area.  Additionally complained of nausea and vomiting for one month in which he has not had evaluated.  Was alert and oriented initially and denied any neck or back pain.   In ER, patient found in afib with RVR, no temperature available yet, normoxia, and SBP 130-150's.  While in ER patient c/o of neck, back, and ankle pain then became more agitated, altered and tremulous with concern for ETOH withdrawal.  Given ativan '1mg'$  twice.  Additionally found to be in Afib with RVR, placed on cardizem gtt. Labs noted for Na 113, Cl 79, sCr 4.5, BUN/ sCr 64/ 5.11 (prior 54/ 3.26 04/2021), bicarb 15, AG 19, glucose 179, AST/ ALT 712/ 553, t. Bili 2.1, BNP 2970, trop hs 36, WBC 19.8, Hgb 10.3, Hct 28, plts 185, neg ETOH level, EKG afib with incomplete RBBB, rate 125, Qtc 579.  CTH and cervical neg for acute fx or intracranial process. CXR showed cardiomegaly with mild pulmonary venous congestion and possible early right perihilar opacity.  XR left/ right ankle neg, lumbar and pelvis neg for fx.  He has received 1L LR in ER.  PCCM called for admit.  Patient remains less talkative after ativan, remains tremulous, and confused.   Pertinent  Medical History  Former smoker, Afib on Xarelto, ETOH use, HTN, HFpEF, CKD IV, DMT2, anemia of chronic disease, colovesical fistula  Significant Hospital Events: Including procedures, antibiotic start and stop dates in addition to other pertinent events   1/11 admitted  1/12  sodium level improved on 3% saline, bicarb drip started 1/13 3% saline restarted 1/15 developed septic shock, DIC, started on CRRT. HD cath and CVL placed.  Interim History / Subjective:   Off Levophed. Remains critically ill, on low-dose Precedex, on CRRT. Given oxycodone this morning for pain so somewhat somnolent  Objective   Blood pressure 109/65, pulse (!) 107, temperature 98.2 F (36.8 C), temperature source Axillary, resp. rate 17, height '5\' 9"'$  (1.753 m), weight 76.1 kg, SpO2 95 %.        Intake/Output Summary (Last 24 hours) at 09/05/2022 0903 Last data filed at 09/05/2022 0800 Gross per 24 hour  Intake 1446.3 ml  Output 3163.3 ml  Net -1717 ml    Filed Weights   08/24/2022 0831 09/04/22 0106 09/05/22 0500  Weight: 72.6 kg 77.8 kg 76.1 kg   Examination: General:  ill appearing male, laying in bed HEENT: dry mucous membranes, sclera anicteric, PERRL Neuro: Awake, follows one-step commands CV: irregularly irregular, no murmur PULM:  course breath sounds GI: soft, non-tender Extremities: warm/dry, 1+ edema  Skin: Ecchymosis + both forearms  RUQ [place] 09/15/2022 Unremarkable  Echo 09/18/2022 EF 20 to 25%, global hypokinesis RV systolic function mildly reduced, size moderately enlarged. Mildly elevated PA pressure. LA and RA severely dilated.  Renal US 09/04/2022 Within normal limits  CT Abdomen 09/03/22 Distended fluid filled stomach   Labs show decreasing LFTs, decreasing WBC count, stable hemoglobin and platelets dropping further from  40-26 K, decreasing creatinine, normal electrolytes  Chest x-ray 1/15 shows hazy right infrahilar/bibasilar opacification  Resolved Hospital Problem list   Hypomagnesemia Hypernatremia Anion Gap Metabolic Acidosis lactic acidosis,  Assessment & Plan:   Septic Shock due to pneumonia -Blood cultures negative so far, staph epi 1/2 on 1/11  likely contaminant - continue cefepime, flagyl and azithromycin, will dial down antibiotics as  he continues to improve -Off Levophed now  Acute metabolic/ toxic encephalopathy ETOH withdrawal ?Uremia - CT Head 1/14 negative - high dose thiamine  - CIWA protocol with PRN ativan - precedex as needed, goal RASS 0 - soft wrist restraints   Acute Hypoxemic Respiratory Failure CAP vs aspiration PNA Bilateral Pleural Effusions - continue supplemental O2 for SpO2 92% or greater - cefepime, flagyl and azithromycin for antibiotics  - MRSA screen negative 09/07/2022 - aspiration precautions  AKI on CKD stage IV Hypocalcemia  - nephrology followin - Continue CRRT - Avoid nephrotoxic agents, ensure adequate renal perfusion - Nephrology following  Acute Systolic Heart Failure Afib with RVR HTN - Prior TTE 02/2020> EF 55-60%, G3DD, normal RV, mild MR - New Echo with EF 20-25% - Cardiology following, no changes recommended - Troponins not significant elevated and no ischemic EKG changes - Continue aggressive electrolyte replacement - TSH within normal limits - Hold Heparin due to DIC - hold holme clonidine, bidil, labetalol for now, resume as blood pressure improves  Possible DIC, fibrinogen was 200, alternative cause would be liver disease in the presence of Xarelto Acute blood loss Anemia due GI bleeding Thrombocytopenia - Transfused 2 units PRBCs, 2 units cryo and 2 units FFP on 1/15 - trend CBC/ transfuse for Hgb < 7 or active bleeding  - hold heparin - Follow up HIT ab testing    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 Will place to gravity today -Start tube feeds - monitor INR and LFTs  Prolonged QTC - maximize electrolytes as above and avoid Qtc prolonging meds - tele  DM with hyperglycemia - SSI sensitive with CBG q4  Left temporal scalp laceration  s/p staple 2/2 fall  - staple removal in 7-10 days   Best Practice (right click and "Reselect all SmartList Selections" daily)   Diet/type: tubefeeds and NPO DVT prophylaxis: not indicated GI prophylaxis: PPI Lines: Central line and Dialysis Catheter Foley:  Yes, and it is still needed Code Status:  full code Last date of multidisciplinary goals of care discussion -1/16 with palliative care, DNR issued, updated wife, daughter and brother at bedside 1/17  Labs   CBC: Recent Labs  Lab 09/18/2022 0855 08/31/22 0330 09/02/22 1244 09/03/22 0525 09/03/22 0904 09/03/22 1042 09/04/22 0333 09/05/22 0309  WBC 19.8*   < > 16.7* 14.5*  --  13.9* 16.4* 15.6*  NEUTROABS 17.0*  --   --   --   --  11.6*  --   --   HGB 10.3*   < > 9.4* 8.3*  --  8.0* 8.4* 8.3*  HCT 28.3*   < > 26.4* 24.1*  --  23.8* 24.2* 24.3*  MCV 87.3   < > 89.8 92.3  --  94.8 88.6 91.4  PLT 185   < > 66* 54* 60* 61* 43*  40* 26*   < > = values in this interval not displayed.  Basic Metabolic Panel: Recent Labs  Lab 09/04/2022 1754 09/08/2022 2258 08/31/22 0330 08/31/22 0753 09/02/22 1835 09/02/22 2037 09/03/22 0529 09/03/22 1555 09/04/22 0333 09/04/22 1730 09/05/22 0309  NA 115*   < > 119*   < > 128*   < > 132* 135 135 136 136  K  --   --  3.8   < > 4.8  --  5.9* 5.5* 3.4* 3.5 3.6  CL  --   --  85*   < > 95*  --  96* 98 100 101 103  CO2  --   --  14*   < > 11*  --  10* 9* 22 23 21*  GLUCOSE  --   --  72   < > 120*  --  125* 185* 164* 152* 152*  BUN  --   --  71*   < > 91*  --  99* 78* 73* 45* 38*  CREATININE  --   --  5.43*   < > 6.14*  --  6.38* 5.63* 3.42* 2.23* 1.98*  CALCIUM  --   --  4.6*   < > 6.6*  --  6.4* 6.1* 6.5* 7.1* 7.1*  MG 1.4*  --  2.1  --  2.0  --   --   --  2.3  --  2.3  PHOS  --   --   --    < >  --   --  9.6* 8.7* 4.3 2.9 2.6   < > = values in this interval not displayed.    GFR: Estimated Creatinine Clearance: 34.2 mL/min (A) (by C-G formula based on SCr of 1.98 mg/dL (H)). Recent  Labs  Lab 09/03/2022 1222 08/27/2022 1648 09/03/22 0525 09/03/22 1037 09/03/22 1042 09/03/22 1555 09/03/22 1839 09/03/22 2255 09/04/22 0333 09/05/22 0309  PROCALCITON 0.39  --   --   --   --  0.90  --   --   --   --   WBC  --    < > 14.5*  --  13.9*  --   --   --  16.4* 15.6*  LATICACIDVEN  --    < >  --  >9.0*  --   --  5.7* 3.9* 2.1*  --    < > = values in this interval not displayed.     Liver Function Tests: Recent Labs  Lab 08/31/22 0330 09/01/22 2345 09/02/22 0841 09/03/22 1042 09/03/22 1555 09/04/22 0333 09/04/22 1730 09/05/22 0309 09/05/22 0603  AST 1,370* 718*  --  2,030*  --  859*  --   --  354*  ALT 856* 914*  --  1,576*  --  1,027*  --   --  669*  ALKPHOS 69 128*  --  113  --  93  --   --  94  BILITOT 1.4* 2.4*  --  3.4*  --  4.0*  --   --  4.8*  PROT 5.8* 6.4*  --  5.5*  --  4.7*  --   --  4.5*  ALBUMIN 2.6* 2.9*   < > 2.6* 2.5* 2.4*  2.3* 2.5* 2.3* 2.2*   < > = values in this interval not displayed.    Recent Labs  Lab 09/01/2022 1222 09/03/22 1042  LIPASE 49 81*    Recent Labs  Lab 09/01/2022 1131 09/03/22 1555  AMMONIA 26 89*     ABG    Component Value Date/Time   HCO3 12.3 (L) 09/03/2022 1555  TCO2 23 06/26/2011 0946   ACIDBASEDEF 13.4 (H) 09/03/2022 1555   O2SAT 71.5 09/03/2022 1555     Coagulation Profile: Recent Labs  Lab 09/11/2022 1336 08/31/22 0330 09/03/22 0904 09/03/22 1042 09/04/22 0333  INR 4.2* 2.5* 6.8* 6.8* 4.0*  3.9*     Cardiac Enzymes: No results for input(s): "CKTOTAL", "CKMB", "CKMBINDEX", "TROPONINI" in the last 168 hours.  HbA1C: Hgb A1c MFr Bld  Date/Time Value Ref Range Status  05/16/2021 11:47 AM 6.8 (H) 4.6 - 6.5 % Final    Comment:    Glycemic Control Guidelines for People with Diabetes:Non Diabetic:  <6%Goal of Therapy: <7%Additional Action Suggested:  >8%   03/14/2020 10:16 AM 5.9 (H) 4.8 - 5.6 % Final    Comment:    (NOTE) Pre diabetes:          5.7%-6.4%  Diabetes:               >6.4%  Glycemic control for   <7.0% adults with diabetes     CBG: Recent Labs  Lab 09/04/22 1600 09/04/22 1946 09/04/22 2315 09/05/22 0443 09/05/22 0808  GLUCAP 138* 155* 137* 132* 128*     Critical care time: 38 mins     Paulino Cork V. Elsworth Soho MD [facility] Office: [phone]   See Amion for personal pager PCCM on call pager [phone] until 7pm. Please call Elink 7p-7a. [phone]

## 2022-09-05 NOTE — Progress Notes (Signed)
[facility] Progress Note Patient Name: Douglas Ballard DOB: Jan 19, 1951 MRN: [id number]   Date of Service  09/05/2022  HPI/Events of Note  Platelet count 26 K, no evidence of active bleeding. He is not on Heparin.  eICU Interventions  Trend platelet count and transfuse if < 10 K.        Kerry Kass [name] 09/05/2022, 6:03 AM

## 2022-09-05 NOTE — Progress Notes (Signed)
Rounding Note    Patient Name: Douglas Ballard Date of Encounter: 09/05/2022  [place] Cardiologist: Peter Martinique, MD   Subjective   Patient is significantly more alert and responsive this morning. He is able to follow commands and answer yes/no questions.   Inpatient Medications    Scheduled Meds:  sodium chloride   Intravenous Once   sodium chloride   Intravenous Once   sodium chloride   Intravenous Once   Chlorhexidine Gluconate Cloth  6 each Topical Daily   insulin aspart  0-9 Units Subcutaneous Q4H   ipratropium-albuterol  3 mL Nebulization TID   pantoprazole (PROTONIX) IV  40 mg Intravenous Q12H   Continuous Infusions:   prismasol BGK 4/2.5 400 mL/hr at 09/04/22 2230    prismasol BGK 4/2.5 400 mL/hr at 09/04/22 2105   anticoagulant sodium citrate     azithromycin Stopped (09/04/22 1104)   ceFEPime (MAXIPIME) IV Stopped (09/04/22 2136)   dexmedetomidine (PRECEDEX) IV infusion 0.5 mcg/kg/hr (09/05/22 0700)   metronidazole Stopped (09/04/22 2307)   norepinephrine (LEVOPHED) Adult infusion Stopped (09/05/22 0445)   prismasol BGK 4/2.5 1,500 mL/hr at 09/05/22 0440   thiamine (VITAMIN B1) injection Stopped (09/04/22 1402)   PRN Meds: anticoagulant sodium citrate, docusate sodium, LORazepam, mouth rinse, polyethylene glycol, sodium chloride   Vital Signs    Vitals:   09/05/22 0615 09/05/22 0630 09/05/22 0645 09/05/22 0700  BP: 115/60 113/66 97/63 109/65  Pulse: (!) 117 (!) 113 (!) 107 (!) 107  Resp: (!) '22 20 17 17  '$ Temp:      TempSrc:      SpO2: 94% 96% 95% 95%  Weight:      Height:        Intake/Output Summary (Last 24 hours) at 09/05/2022 0740 Last data filed at 09/05/2022 0700 Gross per 24 hour  Intake 1601.43 ml  Output 3457.4 ml  Net -1855.97 ml      09/05/2022    5:00 AM 09/04/2022    1:06 AM 09/04/2022    8:31 AM  Last 3 Weights  Weight (lbs) 167 lb 12.3 oz 171 lb 8.3 oz 160 lb  Weight (kg) 76.1 kg 77.8 kg 72.576 kg      Telemetry     Atrial fibrillation with ventricular rates 100-120. Intermittent PVCs - Personally Reviewed  ECG    No new tracing - Personally Reviewed  Physical Exam   GEN: No acute distress.  Ill appearing. Neck: No JVD Cardiac: irregularly irregular, no murmurs, rubs, or gallops.  Respiratory: Diffuse rhochi GI: Soft, nontender, non-distended  MS: No edema; No deformity. Neuro:  Answers yes/no questions, follows commands Psych: limited assessment  Labs    High Sensitivity Troponin:   Recent Labs  Lab 08/23/2022 0855 09/15/2022 1130  TROPONINIHS 36* 37*     Chemistry Recent Labs  Lab 09/01/22 2345 09/02/22 0841 09/02/22 1835 09/02/22 2037 09/03/22 1042 09/03/22 1555 09/04/22 0333 09/04/22 1730 09/05/22 0309  NA 125*  125*   < > 128*   < >  --    < > 135 136 136  K 4.5   < > 4.8   < >  --    < > 3.4* 3.5 3.6  CL 92*   < > 95*   < >  --    < > 100 101 103  CO2 13*   < > 11*   < >  --    < > 22 23 21*  GLUCOSE 128*   < > 120*   < >  --    < >  164* 152* 152*  BUN 86*   < > 91*   < >  --    < > 73* 45* 38*  CREATININE 5.79*   < > 6.14*   < >  --    < > 3.42* 2.23* 1.98*  CALCIUM 7.2*   < > 6.6*   < >  --    < > 6.5* 7.1* 7.1*  MG  --   --  2.0  --   --   --  2.3  --  2.3  PROT 6.4*  --   --   --  5.5*  --  4.7*  --   --   ALBUMIN 2.9*   < >  --    < > 2.6*   < > 2.4*  2.3* 2.5* 2.3*  AST 718*  --   --   --  2,030*  --  859*  --   --   ALT 914*  --   --   --  1,576*  --  1,027*  --   --   ALKPHOS 128*  --   --   --  113  --  93  --   --   BILITOT 2.4*  --   --   --  3.4*  --  4.0*  --   --   GFRNONAA 10*   < > 9*   < >  --    < > 18* 31* 35*  ANIONGAP 20*   < > 22*   < >  --    < > '13 12 12   '$ < > = values in this interval not displayed.    Lipids No results for input(s): "CHOL", "TRIG", "HDL", "LABVLDL", "LDLCALC", "CHOLHDL" in the last 168 hours.  Hematology Recent Labs  Lab 09/03/22 1042 09/04/22 0333 09/05/22 0309  WBC 13.9* 16.4* 15.6*  RBC 2.51* 2.73* 2.66*  HGB  8.0* 8.4* 8.3*  HCT 23.8* 24.2* 24.3*  MCV 94.8 88.6 91.4  MCH 31.9 30.8 31.2  MCHC 33.6 34.7 34.2  RDW 13.1 13.0 13.6  PLT 61* 43*  40* 26*   Thyroid  Recent Labs  Lab 08/31/22 0330  TSH 0.846    BNP Recent Labs  Lab 09/03/2022 0855  BNP 2,970.3*    DDimer  Recent Labs  Lab 09/03/22 0904 09/04/22 0333  DDIMER 7.44* 8.50*     Radiology    DG CHEST PORT 1 VIEW  Result Date: 09/03/2022 CLINICAL DATA:  Central line placement. EXAM: PORTABLE CHEST 1 VIEW COMPARISON:  09/03/2022 FINDINGS: Right IJ central venous catheter has tip over the SVC. Left IJ venous catheter extends to the region of the SVC where tip then turns superior for 1.5 cm and may be within the azygous vein versus superiorly oriented within the SVC. NG tube has tip over the stomach in the left upper quadrant. There is hazy infrahilar/bibasilar opacification without significant change which may be due to interstitial edema versus infection. Possible small amount left pleural fluid unchanged. No pneumothorax. Cardiomediastinal silhouette and remainder of the exam is unchanged. IMPRESSION: 1. Hazy infrahilar/bibasilar opacification without significant change which may be due to interstitial edema versus infection. Possible small amount left pleural fluid unchanged. 2. Tubes and lines as described. Electronically Signed   By: Marin Olp M.D.   On: 09/03/2022 15:24   DG Abd 1 View  Addendum Date: 09/03/2022   ADDENDUM REPORT: 09/03/2022 12:23 ADDENDUM: Critical Value/emergent results were called by telephone at the time  of interpretation on 09/03/2022 at 12:23 pm to provider [facility] , who verbally acknowledged these results. Electronically Signed   By: Misty Stanley M.D.   On: 09/03/2022 12:23   Result Date: 09/03/2022 CLINICAL DATA:  NG tube placement. EXAM: ABDOMEN - 1 VIEW COMPARISON:  Earlier same day. FINDINGS: 1206 hours. The NG tube tip follows a course consistent with placement in the left mainstem  bronchus. There is no evidence of hiatal hernia on CT scan performed earlier today to account for the course of the NG tube. Right IJ central line again noted. Basilar airspace disease with left effusion noted in the lower chest. IMPRESSION: NG tube tip follows a course consistent with placement in the left mainstem bronchus. Tube should be removed and replaced. Electronically Signed: By: Misty Stanley M.D. On: 09/03/2022 12:19   DG CHEST PORT 1 VIEW  Result Date: 09/03/2022 CLINICAL DATA:  Central line placement EXAM: PORTABLE CHEST 1 VIEW COMPARISON:  09/03/2022, 8:24 a.m. FINDINGS: Interval placement of right neck multi lumen vascular catheter, tip near the superior cavoatrial junction. Otherwise unchanged AP portable chest radiograph featuring cardiomegaly, diffuse bilateral interstitial pulmonary opacity, and probable layering left pleural effusion. No focal airspace opacity. IMPRESSION: 1. Interval placement of right neck multi lumen vascular catheter, tip near the superior cavoatrial junction. 2. Otherwise unchanged AP portable chest radiograph featuring cardiomegaly, diffuse bilateral interstitial pulmonary opacity, and probable layering left pleural effusion. No new or focal airspace opacity. Electronically Signed   By: Delanna Ahmadi M.D.   On: 09/03/2022 12:01   CT ABDOMEN PELVIS WO CONTRAST  Result Date: 09/03/2022 CLINICAL DATA:  Encephalopathic.  Abdominal pain.  Fall. EXAM: CT ABDOMEN AND PELVIS WITHOUT CONTRAST TECHNIQUE: Multidetector CT imaging of the abdomen and pelvis was performed following the standard protocol without IV contrast. RADIATION DOSE REDUCTION: This exam was performed according to the departmental dose-optimization program which includes automated exposure control, adjustment of the mA and/or kV according to patient size and/or use of iterative reconstruction technique. COMPARISON:  None FINDINGS: Lower chest: Patchy foci of ground-glass density within the RIGHT middle lobe  and RIGHT lower lobe. Bilateral small effusions. Hepatobiliary: No focal hepatic lesion on noncontrast exam. Gallbladder normal. Pancreas: Pancreas is normal. No ductal dilatation. No pancreatic inflammation. Spleen: Normal spleen Adrenals/urinary tract: Adrenal glands and kidneys normal. Foley catheter and gas within bladder Stomach/Bowel: Stomach is distended with fluid. Small bowel is normal without evidence distension. The colon and rectosigmoid colon are normal. Vascular/Lymphatic: Abdominal aorta is normal caliber with atherosclerotic calcification. There is no retroperitoneal or periportal lymphadenopathy. No pelvic lymphadenopathy. Reproductive: Post prostatectomy. Other: No free fluid. Musculoskeletal: No aggressive osseous lesion. Anasarca subcutaneous tissues of the flank. IMPRESSION: 1. No evidence of trauma in the abdomen pelvis. 2. Patchy ground-glass density in the RIGHT middle lobe and RIGHT lower lobe could represent pulmonary infection or edema. 3. Small bilateral effusions. 4. Stomach distended with fluid.  No evidence of bowel obstruction. 5. Foley catheter in place. 6. Subcutaneous anasarca 7.  Aortic Atherosclerosis (ICD10-I70.0). Electronically Signed   By: Suzy Bouchard M.D.   On: 09/03/2022 10:39   DG CHEST PORT 1 VIEW  Result Date: 09/03/2022 CLINICAL DATA:  Altered mental status. History of atrial fibrillation, hypertension and pneumonia. EXAM: PORTABLE CHEST 1 VIEW COMPARISON:  09/19/2022; 03/14/2020 FINDINGS: Grossly unchanged enlarged cardiac silhouette and mediastinal contours with atherosclerotic plaque within the thoracic aorta. Worsening slightly nodular airspace opacities within the peripheral aspect of the right mid lung. Worsening bilateral infrahilar opacities. Trace bilateral  effusions are not excluded. No pneumothorax. No acute osseous abnormalities. Old left seventh rib fracture. IMPRESSION: Worsening right mid lung and bilateral infrahilar heterogeneous opacities,  potentially atelectasis though multifocal infection, including atypical etiologies, could have a similar appearance. Further evaluation with a PA and lateral chest radiograph may be obtained as clinically indicated. Electronically Signed   By: Sandi Mariscal M.D.   On: 09/03/2022 08:48    Cardiac Studies   09/07/2022 TTE   IMPRESSIONS     1. Left ventricular ejection fraction, by estimation, is 20 to 25%. The  left ventricle has severely decreased function. The left ventricle  demonstrates global hypokinesis. The left ventricular internal cavity size  was mildly dilated. There is mild left  ventricular hypertrophy. Left ventricular diastolic function could not be  evaluated. Elevated left atrial pressure.   2. Right ventricular systolic function is mildly reduced. The right  ventricular size is moderately enlarged. There is mildly elevated  pulmonary artery systolic pressure.   3. Left atrial size was severely dilated.   4. Right atrial size was severely dilated.   5. The mitral valve is normal in structure. Mild mitral valve  regurgitation. No evidence of mitral stenosis.   6. Tricuspid valve regurgitation is mild to moderate.   7. The aortic valve is tricuspid. Aortic valve regurgitation is trivial.  No aortic stenosis is present.   8. The inferior vena cava is normal in size with greater than 50%  respiratory variability, suggesting right atrial pressure of 3 mmHg.   FINDINGS   Left Ventricle: Left ventricular ejection fraction, by estimation, is 20  to 25%. The left ventricle has severely decreased function. The left  ventricle demonstrates global hypokinesis. Definity contrast agent was  given IV to delineate the left  ventricular endocardial borders. The left ventricular internal cavity size  was mildly dilated. There is mild left ventricular hypertrophy. Left  ventricular diastolic function could not be evaluated due to atrial  fibrillation. Left ventricular diastolic  function  could not be evaluated. Elevated left atrial pressure.   Right Ventricle: The right ventricular size is moderately enlarged. Right  ventricular systolic function is mildly reduced. There is mildly elevated  pulmonary artery systolic pressure. The tricuspid regurgitant velocity is  3.05 m/s, and with an assumed  right atrial pressure of 3 mmHg, the estimated right ventricular systolic  pressure is 09.6 mmHg.   Left Atrium: Left atrial size was severely dilated.   Right Atrium: Right atrial size was severely dilated.   Pericardium: Trivial pericardial effusion is present.   Mitral Valve: The mitral valve is normal in structure. Mild mitral annular  calcification. Mild mitral valve regurgitation. No evidence of mitral  valve stenosis.   Tricuspid Valve: The tricuspid valve is normal in structure. Tricuspid  valve regurgitation is mild to moderate. No evidence of tricuspid  stenosis.   Aortic Valve: The aortic valve is tricuspid. Aortic valve regurgitation is  trivial. Aortic regurgitation PHT measures 394 msec. No aortic stenosis is  present. Aortic valve mean gradient measures 2.6 mmHg. Aortic valve peak  gradient measures 4.8 mmHg.  Aortic valve area, by VTI measures 2.14 cm.   Pulmonic Valve: The pulmonic valve was normal in structure. Pulmonic valve  regurgitation is trivial. No evidence of pulmonic stenosis.   Aorta: The aortic root is normal in size and structure.   Venous: The inferior vena cava is normal in size with greater than 50%  respiratory variability, suggesting right atrial pressure of 3 mmHg.   IAS/Shunts:  No atrial level shunt detected by color flow Doppler.   Patient Profile     Douglas Ballard is a 72 y.o. male with a hx of persistent atrial fibrillation, CKD stage IV, alcohol abuse, tobacco abuse, diabetes who is being seen for the evaluation of new onset systolic heart failure at the request of Freda Jackson, MD. Patient was admitted on 09/06/2022 with  altered mental status and encephalopathy. Apparently the patient fell at home (unwitnessed) and was brought to the ED by EMS after he was unable to mobilize following the fall. Patient has been sick for the last month. In the ED, noted with new afib/RVR. He also demonstrated symptoms concerning for alcohol withdrawal and had labs significant for acute liver failure.   Assessment & Plan    Atrial fibrillation with RVR   Patient admitted with acute alcohol withdrawal, aspiration pneumonia, septic shock found with afib/RVR.    Patient remains critically ill, though with clinical improvement. Now off Levophed. Continue to suspect afib 2/2 significant illness. Initially on metoprolol tartrate which has now been discontinued due to low BP. Rates are relatively stable without rate controlling agents at this time and with critical illness, lenient rate control recommended. If RVR recurs, amiodarone would be preferred anti-arrhythmic/rate control agent. Goal HR <120bpm.  Maintain K>4, Mg>2.0 Continue to hold anticoagulation with acutely low platelets/low HGB with acute sepsis and DIC. Thrombocytopenia worse today, 61->40->26.   Acute systolic heart failure   Patient noted to have LVEF 20-25%, global hypokinesis on TTE this admission. 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 BP decreased yesterday and patient was initiated on Levophed and Vasopressin, now off pressors. Also had significant lactic acidosis, AKI, and liver failure on admission, now all notably improved. Remains on HD.    Patient not a candidate for cardiac angiography/HF workup at this time.   Continue volume management with hemodialysis in the setting of acute renal failure. No ACEi/ARB/ARNI/MRA given acute AKI Strongly support palliative care involvement     Per primary team: AKI Acute liver failure Septic shock Aspiration pneumonia ETOH  withdrawal Anemia Thrombocytopenia/DIC      For questions or updates, please contact [place] Please consult [email or web address] for contact info under        Signed, Lily Kocher, PA-C  09/05/2022, 7:40 AM

## 2022-09-05 NOTE — Progress Notes (Signed)
Daily Progress Note   Patient Name: Douglas Ballard       Date: 09/05/2022 DOB: [birth year]  Age: 72 y.o. MRN#: [id number] Attending Physician: Rigoberto Noel, MD Primary Care Physician: Vivi Barrack, MD Admit Date: 09/16/2022 Length of Stay: 6 days  Reason for Consultation/Follow-up: Establishing goals of care  Subjective:   CC: Patient seen laying in bed today awake.  Following up regarding complex medical decision making.  Subjective:  Reviewed EMR prior to seeing patient.  Patient now off Levophed though  remained on low-dose Precedex on CRRT.  When presenting to bedside, patient seen laying in bed.  Patient awake and slightly interactive today though slow to respond and not able to participate in complex discussions.  Patient also in restraints at this time.  Patient's sister at bedside.  Reintroduced myself.  She discussed that patient is looking better today than he did yesterday.  Family still hopeful about "improvement" and taking things a day at a time.  Acknowledged this and provided emotional support.  When interacting with the patient tried to explore his symptoms at this time.  Patient only able to note dry mouth so discussed with sister and bedside RN providing swabs to assist with this.  Noted palliative medicine team would continue to follow along.  Objective:   Vital Signs:  BP 109/65   Pulse (!) 107   Temp 98.2 F (36.8 C) (Axillary)   Resp 17   Ht '5\' 9"'$  (1.753 m)   Wt 76.1 kg   SpO2 93%   BMI 24.78 kg/m   Physical Exam: General: Chronically ill-appearing, awake, laying in bed Eyes: No drainage noted HENT: dry mucous membranes Cardiovascular: Tachycardia noted Respiratory: no increased work of breathing noted, not in respiratory distress Abdomen: Soft Skin: no rashes or lesions on visible skin Neuro: awake though slow to interact on slight sedation  Imaging:  I personally reviewed recent imaging.   Assessment & Plan:   Assessment: Patient is a  72 year old male with a past medical history of A-fib on Xarelto, CHF, CKD stage IV, diabetes mellitus type 2, alcoholism, and prior tobacco use who was admitted on 08/29/2022 for management after unwitnessed fall. Patient found to be altered upon admission. During hospitalization, patient required transfer to ICU for management of septic shock due to pneumonia requiring pressor support as well as AKI on CKD requiring CRRT. Patient also had repeat echo which is showing EF now 25-25%. Nephrology and cardiology following along with patient's course. Medicine team consulted to assist with complex medical decision making.   Recommendations/Plan: # Complex medical decision making/goals of care                -Patient more awake today though not able to participate in complex medical decision making yet.                 -Spent extensive time on 1/16 discussing care with patient's wife, daughter, and sister at bedside.  CODE STATUS changed to DNR on 09/04/2022 after discussion with family.  Continuing to evaluate patient daily on medical course to continue appropriate discussions based on status.  At this time patient remains critically ill on CRRT.         # Symptom management                -As per primary team    # Psycho-social/Spiritual Support:  - Support System: Wife, daughter, sister, brother - Desire for further Chaplain support: Yes, placed consult on 1/16   #  Discharge Planning:  To Be Determined  Discussed with: bedside RN, patient's sister  Thank you for allowing the palliative care team to participate in the care [name].  Chelsea Aus, DO Palliative Care Provider PMT # [phone]  This provider spent a total of 36 minutes providing patient's care.  Includes review of EMR, discussing care with other staff members involved in patient's medical care, obtaining relevant history and information from patient and/or patient's family, and personal review of imaging and lab work. Greater  than 50% of the time was spent counseling and coordinating care related to the above assessment and plan.

## 2022-09-05 NOTE — Progress Notes (Signed)
Douglas Ballard Progress Note   Assessment/ Plan:   Assessment/ Plan: AKI on CKD4 - b/l creatinine 3.7- 4.0 from sept - dec 2023, eGFR 15- 41m/min. Creat here 5.1 on admission in setting of AMS, hyponatremia, etoh w/d. Started CRRT 1/15, changed to 4K bath this AM.  No heparin, sodium citrate locks.  Expect to need another 24 hrs of CRRT then reassess Hyponatremia - resolved with CRRT Shock: likely septic: on broad-spectrum antibiotics/ pressors DIC: per primary Hypocalcemia - aggressive replacement HTN - was on 3 BP lowering meds at home. BP's here are stable to high. Getting metoprolol 25 bid.   Etoh abuse - per pmd AMS - multifactorial due to low Na, etoh w/d, uremia HFrEF - echo showing LVEF 25-30% H/o CVA   Subjective:    Friend reading to him at bedside.  Getting a little better.    Objective:   BP (!) 106/58   Pulse (!) 110   Temp 97.6 F (36.4 C) (Axillary)   Resp (!) 22   Ht '5\' 9"'$  (1.753 m)   Wt 76.1 kg   SpO2 94%   BMI 24.78 kg/m   Intake/Output Summary (Last 24 hours) at 09/05/2022 1558 Last data filed at 09/05/2022 1400 Gross per 24 hour  Intake 1154.7 ml  Output 3036.3 ml  Net -1881.6 ml   Weight change: -1.7 kg  Physical Exam: GFTD:DUKGU CVS: tachy Resp:tachypneic Abd: + fluid wave Ext: 1+ anasarca, improving ACCESS: R IJ nontunneled HD cath  Imaging: No results found.  Labs: BMET Recent Labs  Lab 08/24/2022 0855 08/20/2022 1222 09/02/22 0841 09/02/22 1244 09/02/22 1835 09/02/22 2037 09/03/22 0232 09/03/22 0525 09/03/22 0529 09/03/22 1555 09/04/22 0333 09/04/22 1730 09/05/22 0309  NA 113*   < > 126*   < > 128*   < > 134* 131* 132* 135 135 136 136  K 4.5   < > 5.1  --  4.8  --   --   --  5.9* 5.5* 3.4* 3.5 3.6  CL 79*   < > 92*  --  95*  --   --   --  96* 98 100 101 103  CO2 15*   < > 11*  --  11*  --   --   --  10* 9* 22 23 21*  GLUCOSE 179*   < > 120*  --  120*  --   --   --  125* 185* 164* 152* 152*  BUN 64*   < > 85*  --   91*  --   --   --  99* 78* 73* 45* 38*  CREATININE 5.11*   < > 5.95*  --  6.14*  --   --   --  6.38* 5.63* 3.42* 2.23* 1.98*  CALCIUM 4.9*   < > 6.8*  --  6.6*  --   --   --  6.4* 6.1* 6.5* 7.1* 7.1*  PHOS 5.3*  --  7.4*  --   --   --   --   --  9.6* 8.7* 4.3 2.9 2.6   < > = values in this interval not displayed.   CBC Recent Labs  Lab 09/17/2022 0855 08/31/22 0330 09/03/22 0525 09/03/22 0904 09/03/22 1042 09/04/22 0333 09/05/22 0309  WBC 19.8*   < > 14.5*  --  13.9* 16.4* 15.6*  NEUTROABS 17.0*  --   --   --  11.6*  --   --   HGB 10.3*   < >  8.3*  --  8.0* 8.4* 8.3*  HCT 28.3*   < > 24.1*  --  23.8* 24.2* 24.3*  MCV 87.3   < > 92.3  --  94.8 88.6 91.4  PLT 185   < > 54* 60* 61* 43*  40* 26*   < > = values in this interval not displayed.    Medications:     sodium chloride   Intravenous Once   sodium chloride   Intravenous Once   Chlorhexidine Gluconate Cloth  6 each Topical Daily   feeding supplement (VITAL HIGH PROTEIN)  1,000 mL Per Tube Q24H   insulin aspart  0-9 Units Subcutaneous Q4H   ipratropium-albuterol  3 mL Nebulization TID   mouth rinse  15 mL Mouth Rinse 4 times per day   pantoprazole (PROTONIX) IV  40 mg Intravenous Q12H    Madelon Lips MD 09/05/2022, 3:58 PM

## 2022-09-06 DIAGNOSIS — N179 Acute kidney failure, unspecified: Secondary | ICD-10-CM | POA: Diagnosis not present

## 2022-09-06 DIAGNOSIS — D65 Disseminated intravascular coagulation [defibrination syndrome]: Secondary | ICD-10-CM | POA: Diagnosis not present

## 2022-09-06 DIAGNOSIS — G9340 Encephalopathy, unspecified: Secondary | ICD-10-CM

## 2022-09-06 DIAGNOSIS — A419 Sepsis, unspecified organism: Secondary | ICD-10-CM | POA: Diagnosis not present

## 2022-09-06 LAB — RENAL FUNCTION PANEL
Albumin: 2.4 g/dL — ABNORMAL LOW (ref 3.5–5.0)
Anion gap: 14 (ref 5–15)
BUN: 46 mg/dL — ABNORMAL HIGH (ref 8–23)
CO2: 19 mmol/L — ABNORMAL LOW (ref 22–32)
Calcium: 7.7 mg/dL — ABNORMAL LOW (ref 8.9–10.3)
Chloride: 104 mmol/L (ref 98–111)
Creatinine, Ser: 2.31 mg/dL — ABNORMAL HIGH (ref 0.61–1.24)
GFR, Estimated: 29 mL/min — ABNORMAL LOW (ref >60)
Glucose, Bld: 150 mg/dL — ABNORMAL HIGH (ref 70–99)
Phosphorus: 2.5 mg/dL (ref 2.5–4.6)
Potassium: 4 mmol/L (ref 3.5–5.1)
Sodium: 137 mmol/L (ref 135–145)

## 2022-09-06 LAB — COMPREHENSIVE METABOLIC PANEL
ALT: 480 U/L — ABNORMAL HIGH (ref 0–44)
AST: 157 U/L — ABNORMAL HIGH (ref 15–41)
Albumin: 2.3 g/dL — ABNORMAL LOW (ref 3.5–5.0)
Alkaline Phosphatase: 97 U/L (ref 38–126)
Anion gap: 13 (ref 5–15)
BUN: 46 mg/dL — ABNORMAL HIGH (ref 8–23)
CO2: 20 mmol/L — ABNORMAL LOW (ref 22–32)
Calcium: 7.5 mg/dL — ABNORMAL LOW (ref 8.9–10.3)
Chloride: 102 mmol/L (ref 98–111)
Creatinine, Ser: 2.29 mg/dL — ABNORMAL HIGH (ref 0.61–1.24)
GFR, Estimated: 30 mL/min — ABNORMAL LOW (ref >60)
Glucose, Bld: 150 mg/dL — ABNORMAL HIGH (ref 70–99)
Potassium: 3.9 mmol/L (ref 3.5–5.1)
Sodium: 135 mmol/L (ref 135–145)
Total Bilirubin: 7.5 mg/dL — ABNORMAL HIGH (ref 0.3–1.2)
Total Protein: 5.3 g/dL — ABNORMAL LOW (ref 6.5–8.1)

## 2022-09-06 LAB — CBC
HCT: 25.2 % — ABNORMAL LOW (ref 39.0–52.0)
HCT: 24.1 % — ABNORMAL LOW (ref 39.0–52.0)
Hemoglobin: 8.6 g/dL — ABNORMAL LOW (ref 13.0–17.0)
Hemoglobin: 8.3 g/dL — ABNORMAL LOW (ref 13.0–17.0)
MCH: 31.5 pg (ref 26.0–34.0)
MCH: 31.7 pg (ref 26.0–34.0)
MCHC: 34.1 g/dL (ref 30.0–36.0)
MCHC: 34.4 g/dL (ref 30.0–36.0)
MCV: 92.3 fL (ref 80.0–100.0)
MCV: 92 fL (ref 80.0–100.0)
Platelets: 14 10*3/uL — CL (ref 150–400)
Platelets: 22 10*3/uL — CL (ref 150–400)
RBC: 2.73 MIL/uL — ABNORMAL LOW (ref 4.22–5.81)
RBC: 2.62 MIL/uL — ABNORMAL LOW (ref 4.22–5.81)
RDW: 14.7 % (ref 11.5–15.5)
RDW: 15.2 % (ref 11.5–15.5)
WBC: 20.2 10*3/uL — ABNORMAL HIGH (ref 4.0–10.5)
WBC: 21.3 10*3/uL — ABNORMAL HIGH (ref 4.0–10.5)
nRBC: 0.7 % — ABNORMAL HIGH (ref 0.0–0.2)
nRBC: 0.5 % — ABNORMAL HIGH (ref 0.0–0.2)

## 2022-09-06 LAB — GLUCOSE, CAPILLARY
Glucose-Capillary: 154 mg/dL — ABNORMAL HIGH (ref 70–99)
Glucose-Capillary: 168 mg/dL — ABNORMAL HIGH (ref 70–99)
Glucose-Capillary: 172 mg/dL — ABNORMAL HIGH (ref 70–99)
Glucose-Capillary: 220 mg/dL — ABNORMAL HIGH (ref 70–99)
Glucose-Capillary: 226 mg/dL — ABNORMAL HIGH (ref 70–99)
Glucose-Capillary: 240 mg/dL — ABNORMAL HIGH (ref 70–99)
Glucose-Capillary: 249 mg/dL — ABNORMAL HIGH (ref 70–99)

## 2022-09-06 LAB — MAGNESIUM: Magnesium: 2.3 mg/dL (ref 1.7–2.4)

## 2022-09-06 LAB — PROTIME-INR
INR: 2.7 — ABNORMAL HIGH (ref 0.8–1.2)
Prothrombin Time: 28.5 seconds — ABNORMAL HIGH (ref 11.4–15.2)

## 2022-09-06 LAB — HEPATITIS B SURFACE ANTIGEN: Hepatitis B Surface Ag: NONREACTIVE

## 2022-09-06 LAB — FIBRINOGEN: Fibrinogen: 267 mg/dL (ref 210–475)

## 2022-09-06 MED ORDER — DOCUSATE SODIUM 50 MG/5ML PO LIQD: 100.0000 mg | Freq: Two times a day (BID) | ORAL | Status: DC | PRN

## 2022-09-06 MED ORDER — DILTIAZEM LOAD VIA INFUSION
10.0000 mg | Freq: Once | INTRAVENOUS | Status: AC
  Administered 2022-09-06: 10 mg via INTRAVENOUS
  Filled 2022-09-06: qty 10

## 2022-09-06 MED ORDER — LABETALOL HCL 200 MG PO TABS
200.0000 mg | ORAL_TABLET | Freq: Two times a day (BID) | ORAL | Status: DC
  Administered 2022-09-06 (×2): 200 mg
  Filled 2022-09-06 (×2): qty 1

## 2022-09-06 MED ORDER — OLANZAPINE 10 MG IM SOLR
2.5000 mg | Freq: Once | INTRAMUSCULAR | Status: AC | PRN
  Administered 2022-09-07: 2.5 mg via INTRAMUSCULAR
  Filled 2022-09-06: qty 10

## 2022-09-06 MED ORDER — CHLORHEXIDINE GLUCONATE CLOTH 2 % EX PADS
6.0000 | MEDICATED_PAD | Freq: Every day | CUTANEOUS | Status: DC
  Administered 2022-09-07: 6 via TOPICAL

## 2022-09-06 MED ORDER — POLYETHYLENE GLYCOL 3350 17 G PO PACK: 17.0000 g | PACK | Freq: Every day | ORAL | Status: DC | PRN

## 2022-09-06 MED ORDER — SODIUM CHLORIDE 0.9% IV SOLUTION: Freq: Once | INTRAVENOUS | Status: AC

## 2022-09-06 MED ORDER — SODIUM CHLORIDE 0.9 % IV SOLN
2.0000 g | INTRAVENOUS | Status: DC
  Administered 2022-09-06: 2 g via INTRAVENOUS
  Filled 2022-09-06: qty 20

## 2022-09-06 MED ORDER — SODIUM CHLORIDE 0.9% FLUSH
10.0000 mL | Freq: Two times a day (BID) | INTRAVENOUS | Status: DC
  Administered 2022-09-06 – 2022-09-09 (×7): 10 mL

## 2022-09-06 MED ORDER — DILTIAZEM HCL-DEXTROSE 125-5 MG/125ML-% IV SOLN (PREMIX)
5.0000 mg/h | INTRAVENOUS | Status: DC
  Administered 2022-09-06: 5 mg/h via INTRAVENOUS
  Administered 2022-09-06: 15 mg/h via INTRAVENOUS
  Filled 2022-09-06 (×2): qty 125

## 2022-09-06 MED ORDER — CLONIDINE HCL 0.1 MG PO TABS
0.1000 mg | ORAL_TABLET | Freq: Every day | ORAL | Status: DC
  Administered 2022-09-06: 0.1 mg
  Filled 2022-09-06: qty 1

## 2022-09-06 MED ORDER — SODIUM CHLORIDE 0.9% FLUSH: 10.0000 mL | INTRAVENOUS | Status: DC | PRN

## 2022-09-06 NOTE — Progress Notes (Signed)
Spoke with Dr. Lucile Shutters overnight about Plts of 14, wants to defer to day MD for intervention.

## 2022-09-06 NOTE — Evaluation (Signed)
SLP Cancellation Note  Patient Details Name: Douglas Ballard MRN: [id number] DOB: 05/08/1951   Cancelled treatment:       Reason Eval/Treat Not Completed: Other (comment);Medical issues which prohibited therapy (pt on NRB and has tube feeding in place, RN report he had copious viscous secretions retained in oral cavity when she assessed and provided oral care, will continue efforts)   Macario Golds 09/06/2022, 4:04 PM   Kathleen Lime, MS [place] Office [phone] Pager [phone]

## 2022-09-06 NOTE — Progress Notes (Signed)
[facility] here to take patient, paperwork with crew

## 2022-09-06 NOTE — Progress Notes (Signed)
[place] Progress Note Patient Name: Douglas Ballard DOB: Nov 24, 1950 MRN: [id number]   Date of Service  09/06/2022  HPI/Events of Note  Platelet count 14 K, no evidence of active bleeding.  eICU Interventions  Will defer to daytime PCCM attending physician.        Quashaun Lazalde U Edwardo Wojnarowski 09/06/2022, 6:18 AM

## 2022-09-06 NOTE — Progress Notes (Signed)
[place] Progress Note Patient Name: Douglas Ballard DOB: 1951-07-15 MRN: [id number]   Date of Service  09/06/2022  HPI/Events of Note  Patient with a complicated medical history transferred from [facility] to [facility] to receive Hemodialysis.  eICU Interventions  New Patient Evaluation.        Kerry Kass Sheenah Dimitroff 09/06/2022, 10:20 PM

## 2022-09-06 NOTE — Progress Notes (Signed)
NAME:  Douglas Ballard, MRN:  [id number], DOB:  1950/08/26, LOS: 7 ADMISSION DATE:  08/28/2022, CONSULTATION DATE:  09/14/2022 REFERRING MD:  Dr. Francia Greaves, CHIEF COMPLAINT:  fall   History of Present Illness:   72 year old male with prior hx as below, significant for Afib on Xarelto, HFpEF, CKD IV, and DMT2 presented from home by EMS after unwitnessed fall.  Patient reported to EMS, fell out of bed due to weakness while trying to go the bathroom, denied LOC, and sustained a small laceration to the left temporal area.  Additionally complained of nausea and vomiting for one month in which he has not had evaluated.  Was alert and oriented initially and denied any neck or back pain.   In ER, patient found in afib with RVR, no temperature available yet, normoxia, and SBP 130-150's.  While in ER patient c/o of neck, back, and ankle pain then became more agitated, altered and tremulous with concern for ETOH withdrawal.  Given ativan '1mg'$  twice.  Additionally found to be in Afib with RVR, placed on cardizem gtt. Labs noted for Na 113, Cl 79, sCr 4.5, BUN/ sCr 64/ 5.11 (prior 54/ 3.26 04/2021), bicarb 15, AG 19, glucose 179, AST/ ALT 712/ 553, t. Bili 2.1, BNP 2970, trop hs 36, WBC 19.8, Hgb 10.3, Hct 28, plts 185, neg ETOH level, EKG afib with incomplete RBBB, rate 125, Qtc 579.  CTH and cervical neg for acute fx or intracranial process. CXR showed cardiomegaly with mild pulmonary venous congestion and possible early right perihilar opacity.  XR left/ right ankle neg, lumbar and pelvis neg for fx.  He has received 1L LR in ER.  PCCM called for admit.  Patient remains less talkative after ativan, remains tremulous, and confused.   Pertinent  Medical History  Former smoker, Afib on Xarelto, ETOH use, HTN, HFpEF, CKD IV, DMT2, anemia of chronic disease, colovesical fistula  Significant Hospital Events: Including procedures, antibiotic start and stop dates in addition to other pertinent events   1/11 admitted  1/12  sodium level improved on 3% saline, bicarb drip started 1/13 3% saline restarted 1/15 developed septic shock, DIC, started on CRRT. HD cath and CVL placed. 1/16 off Levophed  Interim History / Subjective:   Off CRRT since yesterday due to machine malfunction. Developed atrial fibrillation, RVR overnight and placed on Cardizem drip. Remains critically ill, in restraints, off Precedex Minimal dark urine output  Objective   Blood pressure (!) 168/74, pulse (!) 113, temperature 98.1 F (36.7 C), temperature source Oral, resp. rate (!) 24, height '5\' 9"'$  (1.753 m), weight 77.6 kg, SpO2 90 %.        Intake/Output Summary (Last 24 hours) at 09/06/2022 0839 Last data filed at 09/06/2022 0600 Gross per 24 hour  Intake 1819.9 ml  Output 983 ml  Net 836.9 ml    Filed Weights   09/04/22 0106 09/05/22 0500 09/06/22 0500  Weight: 77.8 kg 76.1 kg 77.6 kg   Examination: General:  ill appearing male, laying in bed HEENT: dry mucous membranes, sclera anicteric, PERRL Neuro: Awake, follows one-step commands, oriented to person-not to time or place CV: irregularly irregular, tacky no murmur PULM:  coarse breath sounds GI: soft, non-tender Extremities: warm/dry, 1+ edema  Skin: Ecchymosis + both forearms  RUQ [place] 09/01/2022 Unremarkable  Echo 08/31/2022 EF 20 to 25%, global hypokinesis RV systolic function mildly reduced, size moderately enlarged. Mildly elevated PA pressure. LA and RA severely dilated.  Renal US 09/10/2022 Within normal limits  CT Abdomen 09/03/22 Distended fluid filled stomach   Labs show decreasing LFTs, increasing WBC count, stable hemoglobin and platelets dropping further from 40-14 K, slight rise in creatinine, normal electrolytes  Chest x-ray 1/15 shows hazy right infrahilar/bibasilar opacification  Resolved Hospital Problem list   Hypomagnesemia Hypernatremia Anion Gap Metabolic Acidosis lactic acidosis,  Assessment & Plan:   Septic Shock due to  pneumonia Aspiration possible -Blood cultures negative so far, staph epi 1/2 on 1/11  likely contaminant - continue cefepime -DC Flagyl and azithromycin due to prolonged QT  Acute metabolic/ toxic encephalopathy, improving ETOH withdrawal ?Uremia - CT Head 1/14 negative - high dose thiamine  - CIWA protocol with PRN ativan - precedex as needed, goal RASS 0 - soft wrist restraints continue -Attempt mobilization and swallow evaluation today  Acute Hypoxemic Respiratory Failure CAP vs aspiration PNA Bilateral Pleural Effusions - continue supplemental O2 for SpO2 92% or greater - cefepime - MRSA screen negative 09/07/2022 - aspiration precautions -Chest x-ray in a.m.  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 Afib with RVR HTN - Prior TTE 02/2020> EF 55-60%, G3DD, normal RV, mild MR - New Echo with EF 20-25% - Cardiology following, no changes recommended - Troponins not significant elevated and no ischemic EKG changes - TSH within normal limits - Hold Heparin due to DIC, candidate for long-term anticoagulation -Resume labetalol and clonidine & aim for lenient rate control <120, doubt amiodarone necessary here - hold home bidil, resume as blood pressure improves  Possible DIC, fibrinogen was 200, alternative cause would be liver disease in the presence of Xarelto, HIT ab neg Acute blood loss Anemia due GI bleeding Thrombocytopenia, severe - Transfused 2 units PRBCs, 2 units cryo and 2 units FFP on 1/15 - trend CBC/ transfuse for Hgb < 7 or active bleeding  - hold heparin, would not restart anticoagulation -Transfuse 1 unit platelets today   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 Abdominal  Pain/Distension Elevated LFTs, improving - tylenol level not elevated - hepatitis panel negative - RUQ [place] normal - CT Abdomen with gastric distension  -Start tube feeds - monitor INR and LFTs  Prolonged QTC - maximize electrolytes as above and avoid Qtc prolonging meds such as azithromycin - tele  DM with hyperglycemia - SSI sensitive with CBG q4  Left temporal scalp laceration s/p staple 2/2 fall  - staple removal in 7-10 days   Summary -multiorgan failure improving, but overall prognosis remains guarded, doubt that he will have renal recovery given CKD stage IV to begin with  Best Practice (right click and "Reselect all SmartList Selections" daily)   Diet/type: tubefeeds and NPO DVT prophylaxis: not indicated GI prophylaxis: PPI Lines: Central line and Dialysis Catheter Foley:  Yes, and it is still needed Code Status:  full code Last date of multidisciplinary goals of care discussion -1/16 with palliative care, DNR issued, updated wife, daughter and brother at bedside 1/17  Labs   CBC: Recent Labs  Lab 09/18/2022 0855 08/31/22 0330 09/03/22 0525 09/03/22 0904 09/03/22 1042 09/04/22 0333 09/05/22 0309 09/06/22 0524  WBC 19.8*   < > 14.5*  --  13.9* 16.4* 15.6* 20.2*  NEUTROABS 17.0*  --   --   --  11.6*  --   --   --  HGB 10.3*   < > 8.3*  --  8.0* 8.4* 8.3* 8.6*  HCT 28.3*   < > 24.1*  --  23.8* 24.2* 24.3* 25.2*  MCV 87.3   < > 92.3  --  94.8 88.6 91.4 92.3  PLT 185   < > 54* 60* 61* 43*  40* 26* 14*   < > = values in this interval not displayed.     Basic Metabolic Panel: Recent Labs  Lab 08/31/22 0330 08/31/22 0753 09/02/22 1835 09/02/22 2037 09/04/22 0333 09/04/22 1730 09/05/22 0309 09/05/22 1600 09/06/22 0524  NA 119*   < > 128*   < > 135 136 136 135 137  135  K 3.8   < > 4.8   < > 3.4* 3.5 3.6 3.8 4.0  3.9  CL 85*   < > 95*   < > 100 101 103 103 104  102  CO2 14*   < > 11*   < > 22 23 21* 23 19*  20*  GLUCOSE 72   < > 120*   < > 164*  152* 152* 135* 150*  150*  BUN 71*   < > 91*   < > 73* 45* 38* 33* 46*  46*  CREATININE 5.43*   < > 6.14*   < > 3.42* 2.23* 1.98* 1.56* 2.31*  2.29*  CALCIUM 4.6*   < > 6.6*   < > 6.5* 7.1* 7.1* 7.3* 7.7*  7.5*  MG 2.1  --  2.0  --  2.3  --  2.3  --  2.3  PHOS  --    < >  --    < > 4.3 2.9 2.6 2.4* 2.5   < > = values in this interval not displayed.    GFR: Estimated Creatinine Clearance: 29.3 mL/min (A) (by C-G formula based on SCr of 2.31 mg/dL (H)). Recent Labs  Lab 08/23/2022 1222 09/04/2022 1648 09/03/22 1037 09/03/22 1042 09/03/22 1555 09/03/22 1839 09/03/22 2255 09/04/22 0333 09/05/22 0309 09/06/22 0524  PROCALCITON 0.39  --   --   --  0.90  --   --   --   --   --   WBC  --    < >  --  13.9*  --   --   --  16.4* 15.6* 20.2*  LATICACIDVEN  --    < > >9.0*  --   --  5.7* 3.9* 2.1*  --   --    < > = values in this interval not displayed.     Liver Function Tests: Recent Labs  Lab 09/01/22 2345 09/02/22 0841 09/03/22 1042 09/03/22 1555 09/04/22 0333 09/04/22 1730 09/05/22 0309 09/05/22 0603 09/05/22 1600 09/06/22 0524  AST 718*  --  2,030*  --  859*  --   --  354*  --  157*  ALT 914*  --  1,576*  --  1,027*  --   --  669*  --  480*  ALKPHOS 128*  --  113  --  93  --   --  94  --  97  BILITOT 2.4*  --  3.4*  --  4.0*  --   --  4.8*  --  7.5*  PROT 6.4*  --  5.5*  --  4.7*  --   --  4.5*  --  5.3*  ALBUMIN 2.9*   < > 2.6*   < > 2.4*  2.3* 2.5* 2.3* 2.2* 2.4* 2.4*  2.3*   < > =  values in this interval not displayed.    Recent Labs  Lab 08/25/2022 1222 09/03/22 1042  LIPASE 49 81*    Recent Labs  Lab 09/08/2022 1131 09/03/22 1555  AMMONIA 26 89*     ABG    Component Value Date/Time   HCO3 12.3 (L) 09/03/2022 1555   TCO2 23 06/26/2011 0946   ACIDBASEDEF 13.4 (H) 09/03/2022 1555   O2SAT 71.5 09/03/2022 1555     Coagulation Profile: Recent Labs  Lab 08/31/22 0330 09/03/22 0904 09/03/22 1042 09/04/22 0333 09/06/22 0524  INR 2.5* 6.8* 6.8*  4.0*  3.9* 2.7*     Cardiac Enzymes: No results for input(s): "CKTOTAL", "CKMB", "CKMBINDEX", "TROPONINI" in the last 168 hours.  HbA1C: Hgb A1c MFr Bld  Date/Time Value Ref Range Status  05/16/2021 11:47 AM 6.8 (H) 4.6 - 6.5 % Final    Comment:    Glycemic Control Guidelines for People with Diabetes:Non Diabetic:  <6%Goal of Therapy: <7%Additional Action Suggested:  >8%   03/14/2020 10:16 AM 5.9 (H) 4.8 - 5.6 % Final    Comment:    (NOTE) Pre diabetes:          5.7%-6.4%  Diabetes:              >6.4%  Glycemic control for   <7.0% adults with diabetes     CBG: Recent Labs  Lab 09/05/22 1552 09/05/22 1951 09/05/22 2326 09/06/22 0326 09/06/22 0815  GLUCAP 135* 133* 133* 154* 168*     Critical care time: 35 mins     Leyan Branden V. Elsworth Soho MD [facility] Office: [phone]   See Amion for personal pager PCCM on call pager [phone] until 7pm. Please call Elink 7p-7a. [phone]

## 2022-09-06 NOTE — Consult Note (Signed)
   [facility] CM Inpatient Consult   09/06/2022  Douglas Ballard 08-07-1951 [id number]  [facility] [ACO] Patient: [name] Liaison remote coverage review for patient admitted to [facility] and currently in the ICU level of care noted at this time.   Primary Care Provider:  Vivi Barrack, MD,  with [place] [facility], is listed to provide the transition of care follow up  Patient screened for hospitalization with noted extreme high risk score for unplanned readmission risk and length of stay to assess for potential [employer] service needs for post hospital transition for care coordination.   Plan:  Continue to follow progress and disposition to assess for post hospital community care coordination/management needs.  Referral request for community care coordination: patient still at ICU LOC at this review with ongoing follow up and when post hospital disposition is determined.  Of note, [facility] Care Management/Population Health does not replace or interfere with any arrangements made by the Inpatient Transition of Care team.  For questions contact:   Natividad Brood, RN BSN [place]  [phone] business mobile phone Toll free office [phone]  *[place]  [phone] Fax number: [phone] [email or web address]

## 2022-09-06 NOTE — Progress Notes (Signed)
Called wife to inform of patient moving to 2 H at [facility]

## 2022-09-06 NOTE — Progress Notes (Signed)
[facility] Progress Note   Assessment/ Plan:   Assessment/ Plan: AKI on CKD4 - b/l creatinine 3.7- 4.0 from sept - dec 2023, eGFR 15- 21m/min. Creat here 5.1 on admission in setting of AMS, hyponatremia, etoh w/d. Started CRRT 1/15, changed to 4K bath this AM.  No heparin, sodium citrate locks.  Can transition to IHD.  Will need transfer to [facility] Hyponatremia - resolved with CRRT Shock: likely septic: on broad-spectrum antibiotics/ pressors DIC: per primary Hypocalcemia - aggressive replacement HTN - was on 3 BP lowering meds at home. BP's here are stable to high. Getting metoprolol 25 bid.   Etoh abuse - per pmd AMS - multifactorial due to low Na, etoh w/d, uremia HFrEF - echo showing LVEF 25-30% H/o CVA   Subjective:    Seen in room.  Off pressors.  On dilt gtt.     Objective:   BP (!) 146/64   Pulse 85   Temp 98.6 F (37 C) (Oral)   Resp (!) 22   Ht '5\' 9"'$  (1.753 m)   Wt 77.6 kg   SpO2 93%   BMI 25.26 kg/m   Intake/Output Summary (Last 24 hours) at 09/06/2022 1317 Last data filed at 09/06/2022 1240 Gross per 24 hour  Intake 2406.93 ml  Output 495 ml  Net 1911.93 ml   Weight change: 1.5 kg  Physical Exam: [id number] CVS: tachy Resp:tachypneic Abd: + fluid wave Ext: 1+ anasarca, improving ACCESS: R IJ nontunneled HD cath  Imaging: No results found.  Labs: BMET Recent Labs  Lab 09/03/22 0529 09/03/22 1555 09/04/22 0333 09/04/22 1730 09/05/22 0309 09/05/22 1600 09/06/22 0524  NA 132* 135 135 136 136 135 137  135  K 5.9* 5.5* 3.4* 3.5 3.6 3.8 4.0  3.9  CL 96* 98 100 101 103 103 104  102  CO2 10* 9* 22 23 21* 23 19*  20*  GLUCOSE 125* 185* 164* 152* 152* 135* 150*  150*  BUN 99* 78* 73* 45* 38* 33* 46*  46*  CREATININE 6.38* 5.63* 3.42* 2.23* 1.98* 1.56* 2.31*  2.29*  CALCIUM 6.4* 6.1* 6.5* 7.1* 7.1* 7.3* 7.7*  7.5*  PHOS 9.6* 8.7* 4.3 2.9 2.6 2.4* 2.5   CBC Recent Labs  Lab 09/03/22 1042 09/04/22 0333 09/05/22 0309  09/06/22 0524  WBC 13.9* 16.4* 15.6* 20.2*  NEUTROABS 11.6*  --   --   --   HGB 8.0* 8.4* 8.3* 8.6*  HCT 23.8* 24.2* 24.3* 25.2*  MCV 94.8 88.6 91.4 92.3  PLT 61* 43*  40* 26* 14*    Medications:     sodium chloride   Intravenous Once   sodium chloride   Intravenous Once   Chlorhexidine Gluconate Cloth  6 each Topical Daily   [START ON 09/07/2022] Chlorhexidine Gluconate Cloth  6 each Topical Q0600   cloNIDine  0.1 mg Per Tube QHS   feeding supplement (PROSource TF20)  60 mL Per Tube Daily   feeding supplement (VITAL 1.5 CAL)  1,000 mL Per Tube Q24H   insulin aspart  0-9 Units Subcutaneous Q4H   ipratropium-albuterol  3 mL Nebulization TID   labetalol  200 mg Per Tube BID   multivitamin with minerals  1 tablet Per Tube Daily   mouth rinse  15 mL Mouth Rinse 4 times per day   pantoprazole (PROTONIX) IV  40 mg Intravenous Q12H   sodium chloride flush  10-40 mL Intracatheter Q12H    EMadelon LipsMD 09/06/2022, 1:17 PM

## 2022-09-06 NOTE — Progress Notes (Signed)
[place] Progress Note Patient Name: Douglas Ballard DOB: Jun 13, 1951 MRN: [id number]   Date of Service  09/06/2022  HPI/Events of Note  Patient is in atrial fibrillation with RVR (current rates 130's to 140's), BP 149/106, he is currently of CRRT.  eICU Interventions  Cardizem bolus followed by gtt ordered.        Kerry Kass Qamar Aughenbaugh 09/06/2022, 2:38 AM

## 2022-09-06 NOTE — Progress Notes (Signed)
Per Dr Hollie Salk with nephrology, CRRT will remain stopped today as plan is for patient to transfer to [facility] for HD. Awaiting orders at this time.

## 2022-09-06 NOTE — Progress Notes (Signed)
Report called to [facility] RN @ 2 Heart. Awaiting carelink

## 2022-09-06 NOTE — Evaluation (Signed)
Physical Therapy Evaluation Patient Details Name: Douglas Ballard MRN: [id number] DOB: 1951/07/12 Today's Date: 09/06/2022  History of Present Illness  72 year old male with prior hx as below, significant for Afib on Xarelto, HFpEF, CKD IV, and DMT2 presented from home by EMS after unwitnessed fall.  Patient reported to EMS, fell out of bed due to weakness while trying to go the bathroom, denied LOC, and sustained a small laceration to the left temporal area.  Additionally complained of nausea and vomiting for one month in which he has not had evaluated.  Was alert and oriented initially and denied any neck or back pain. Dx:  afib with RVR, EtOH withdrawal,  1/15 CRRT started.  Clinical Impression  Pt admitted with above diagnosis. Pt somewhat confused, several incorrect guess before able to correctly state his birthdate and the current year. Stated location is "home". Speech is somewhat slurred. Increased time and manual cures required to follow commands. Pt performed bed level BLE exercises with assistance, SpO2 dropped to 83% on 15L O2 with activity. Did not attempt sitting 2* hypoxia.  Pt currently with functional limitations due to the deficits listed below (see PT Problem List). Pt will benefit from skilled PT to increase their independence and safety with mobility to allow discharge to the venue listed below.          Recommendations for follow up therapy are one component of a multi-disciplinary discharge planning process, led by the attending physician.  Recommendations may be updated based on patient status, additional functional criteria and insurance authorization.  Follow Up Recommendations Skilled nursing-short term rehab (<3 hours/day) Can patient physically be transported by private vehicle: No    Assistance Recommended at Discharge Frequent or constant Supervision/Assistance  Patient can return home with the following  Two people to help with walking and/or transfers;Two people to  help with bathing/dressing/bathroom;Direct supervision/assist for medications management;Assist for transportation;Help with stairs or ramp for entrance;Assistance with feeding;Direct supervision/assist for financial management;Assistance with cooking/housework    Equipment Recommendations None recommended by PT  Recommendations for Other Services       Functional Status Assessment Patient has had a recent decline in their functional status and demonstrates the ability to make significant improvements in function in a reasonable and predictable amount of time.     Precautions / Restrictions Precautions Precautions: Fall Precaution Comments: found down at home; family denies h/o other falls in past 6 months Restrictions Weight Bearing Restrictions: No      Mobility  Bed Mobility               General bed mobility comments: NT- SpO2 83% on 15L O2 with bed level exercises    Transfers                        Ambulation/Gait                  Stairs            Wheelchair Mobility    Modified Rankin (Stroke Patients Only)       Balance                                             Pertinent Vitals/Pain Pain Assessment Faces Pain Scale: No hurt Facial Expression: Relaxed, neutral Body Movements: Absence of movements Muscle Tension: Relaxed Compliance with ventilator (intubated  pts.): N/A Vocalization (extubated pts.): N/A CPOT Total: 0    Home Living Family/patient expects to be discharged to:: Private residence Living Arrangements: Spouse/significant other Available Help at Discharge: Family;Available 24 hours/day   Home Access: Stairs to enter   Entrance Stairs-Number of Steps: 2   Home Layout: Able to live on main level with bedroom/bathroom Home Equipment: None      Prior Function Prior Level of Function : Independent/Modified Independent             Mobility Comments: walked without AD ADLs Comments:  independent     Hand Dominance        Extremity/Trunk Assessment   Upper Extremity Assessment Upper Extremity Assessment: Generalized weakness    Lower Extremity Assessment Lower Extremity Assessment: Generalized weakness       Communication   Communication: Expressive difficulties (slurred speech)  Cognition Arousal/Alertness: Awake/alert Behavior During Therapy: WFL for tasks assessed/performed Overall Cognitive Status: Impaired/Different from baseline Area of Impairment: Memory, Following commands, Safety/judgement, Orientation                 Orientation Level: Place   Memory: Decreased short-term memory Following Commands: Follows one step commands with increased time Safety/Judgement: Decreased awareness of deficits, Decreased awareness of safety     General Comments: brother provided home information (brother stated info pt provided was incorrect); increased time to state his birthdate (gave incorrect answers at first, then was able to state it correctly without prompting), stated location is home, state month correctly, increased time to state year (several incorrect guesses at first)        General Comments      Exercises General Exercises - Lower Extremity Ankle Circles/Pumps: AROM, Both, 10 reps, Supine Quad Sets: AROM, Both, 5 reps, Supine Heel Slides: AAROM, Both, 10 reps, Supine Hip ABduction/ADduction: AAROM, Both, 10 reps, Supine   Assessment/Plan    PT Assessment Patient needs continued PT services  PT Problem List Decreased strength;Decreased activity tolerance;Cardiopulmonary status limiting activity;Decreased mobility       PT Treatment Interventions Functional mobility training;Therapeutic activities;Gait training;Stair training;Patient/family education    PT Goals (Current goals can be found in the Care Plan section)  Acute Rehab PT Goals Patient Stated Goal: body building PT Goal Formulation: With patient/family Time For Goal  Achievement: 09/20/22 Potential to Achieve Goals: Fair    Frequency Min 2X/week     Co-evaluation               AM-PAC PT "6 Clicks" Mobility  Outcome Measure Help needed turning from your back to your side while in a flat bed without using bedrails?: Total Help needed moving from lying on your back to sitting on the side of a flat bed without using bedrails?: Total Help needed moving to and from a bed to a chair (including a wheelchair)?: Total Help needed standing up from a chair using your arms (e.g., wheelchair or bedside chair)?: Total Help needed to walk in hospital room?: Total Help needed climbing 3-5 steps with a railing? : Total 6 Click Score: 6    End of Session   Activity Tolerance: Patient limited by fatigue;Treatment limited secondary to medical complications (Comment) (hypoxia) Patient left: in bed;with bed alarm set;with call bell/phone within reach;with family/visitor present;with restraints reapplied Nurse Communication: Mobility status PT Visit Diagnosis: Difficulty in walking, not elsewhere classified (R26.2);Muscle weakness (generalized) (M62.81)    Time: 3299-2426 PT Time Calculation (min) (ACUTE ONLY): 19 min   Charges:   PT Evaluation $PT Eval Moderate  Complexity: 1 Mod          {Sumedha Munnerlyn, Glade Stanford PT 09/06/2022  Acute Rehabilitation Services  Office [phone]

## 2022-09-06 NOTE — Progress Notes (Signed)
[place] Progress Note Patient Name: Douglas Ballard DOB: 12-18-1950 MRN: [id number]   Date of Service  09/06/2022  HPI/Events of Note  Patient with agitated delirium. BP in normal range with anti-hypertensive Rx. No dialysis until much later tomorrow.  eICU Interventions  Zyprexa 2.5 mg IM x 1 ordered. Ativan discontinued from the [facility].        Frederik Pear 09/06/2022, 11:48 PM

## 2022-09-07 ENCOUNTER — Inpatient Hospital Stay (HOSPITAL_COMMUNITY): Payer: Medicare HMO

## 2022-09-07 DIAGNOSIS — E43 Unspecified severe protein-calorie malnutrition: Secondary | ICD-10-CM | POA: Insufficient documentation

## 2022-09-07 DIAGNOSIS — I4891 Unspecified atrial fibrillation: Secondary | ICD-10-CM

## 2022-09-07 DIAGNOSIS — E440 Moderate protein-calorie malnutrition: Secondary | ICD-10-CM

## 2022-09-07 DIAGNOSIS — J9601 Acute respiratory failure with hypoxia: Secondary | ICD-10-CM

## 2022-09-07 DIAGNOSIS — F10930 Alcohol use, unspecified with withdrawal, uncomplicated: Secondary | ICD-10-CM | POA: Diagnosis not present

## 2022-09-07 DIAGNOSIS — Z515 Encounter for palliative care: Secondary | ICD-10-CM | POA: Diagnosis not present

## 2022-09-07 DIAGNOSIS — N179 Acute kidney failure, unspecified: Secondary | ICD-10-CM | POA: Diagnosis not present

## 2022-09-07 LAB — POCT I-STAT 7, (LYTES, BLD GAS, ICA,H+H)
Acid-base deficit: 4 mmol/L — ABNORMAL HIGH (ref 0.0–2.0)
Bicarbonate: 20.6 mmol/L (ref 20.0–28.0)
Calcium, Ion: 1.11 mmol/L — ABNORMAL LOW (ref 1.15–1.40)
HCT: 28 % — ABNORMAL LOW (ref 39.0–52.0)
Hemoglobin: 9.5 g/dL — ABNORMAL LOW (ref 13.0–17.0)
O2 Saturation: 93 %
Patient temperature: 98.1
Potassium: 4.2 mmol/L (ref 3.5–5.1)
Sodium: 138 mmol/L (ref 135–145)
TCO2: 22 mmol/L (ref 22–32)
pCO2 arterial: 34.4 mmHg (ref 32–48)
pH, Arterial: 7.384 (ref 7.35–7.45)
pO2, Arterial: 65 mmHg — ABNORMAL LOW (ref 83–108)

## 2022-09-07 LAB — BPAM PLATELET PHERESIS
Blood Product Expiration Date: 202401182359
ISSUE DATE / TIME: 202401181034
Unit Type and Rh: 5100

## 2022-09-07 LAB — GLUCOSE, CAPILLARY
Glucose-Capillary: 118 mg/dL — ABNORMAL HIGH (ref 70–99)
Glucose-Capillary: 162 mg/dL — ABNORMAL HIGH (ref 70–99)
Glucose-Capillary: 203 mg/dL — ABNORMAL HIGH (ref 70–99)
Glucose-Capillary: 250 mg/dL — ABNORMAL HIGH (ref 70–99)
Glucose-Capillary: 256 mg/dL — ABNORMAL HIGH (ref 70–99)
Glucose-Capillary: 267 mg/dL — ABNORMAL HIGH (ref 70–99)

## 2022-09-07 LAB — CBC
HCT: 24.2 % — ABNORMAL LOW (ref 39.0–52.0)
Hemoglobin: 8.3 g/dL — ABNORMAL LOW (ref 13.0–17.0)
MCH: 31.9 pg (ref 26.0–34.0)
MCHC: 34.3 g/dL (ref 30.0–36.0)
MCV: 93.1 fL (ref 80.0–100.0)
Platelets: 16 10*3/uL — CL (ref 150–400)
RBC: 2.6 MIL/uL — ABNORMAL LOW (ref 4.22–5.81)
RDW: 16.2 % — ABNORMAL HIGH (ref 11.5–15.5)
WBC: 19.3 10*3/uL — ABNORMAL HIGH (ref 4.0–10.5)
nRBC: 0.6 % — ABNORMAL HIGH (ref 0.0–0.2)

## 2022-09-07 LAB — PREPARE PLATELET PHERESIS: Unit division: 0

## 2022-09-07 LAB — RENAL FUNCTION PANEL
Albumin: 2 g/dL — ABNORMAL LOW (ref 3.5–5.0)
Anion gap: 10 (ref 5–15)
BUN: 72 mg/dL — ABNORMAL HIGH (ref 8–23)
CO2: 23 mmol/L (ref 22–32)
Calcium: 7.7 mg/dL — ABNORMAL LOW (ref 8.9–10.3)
Chloride: 104 mmol/L (ref 98–111)
Creatinine, Ser: 3.67 mg/dL — ABNORMAL HIGH (ref 0.61–1.24)
GFR, Estimated: 17 mL/min — ABNORMAL LOW (ref >60)
Glucose, Bld: 256 mg/dL — ABNORMAL HIGH (ref 70–99)
Phosphorus: 3.1 mg/dL (ref 2.5–4.6)
Potassium: 4.1 mmol/L (ref 3.5–5.1)
Sodium: 137 mmol/L (ref 135–145)

## 2022-09-07 LAB — MAGNESIUM
Magnesium: 2.4 mg/dL (ref 1.7–2.4)
Magnesium: 2.5 mg/dL — ABNORMAL HIGH (ref 1.7–2.4)

## 2022-09-07 LAB — HEPATITIS B SURFACE ANTIBODY, QUANTITATIVE: Hep B S AB Quant (Post): 3.8 m[IU]/mL — ABNORMAL LOW (ref >9.9)

## 2022-09-07 LAB — TYPE AND SCREEN
ABO/RH(D): O POS
Antibody Screen: NEGATIVE

## 2022-09-07 LAB — PHOSPHORUS: Phosphorus: 3.2 mg/dL (ref 2.5–4.6)

## 2022-09-07 MED ORDER — VITAL 1.5 CAL PO LIQD
1000.0000 mL | ORAL | Status: DC
  Administered 2022-09-07 – 2022-09-08 (×2): 1000 mL

## 2022-09-07 MED ORDER — RENA-VITE PO TABS
1.0000 | ORAL_TABLET | Freq: Every day | ORAL | Status: DC
  Administered 2022-09-08: 1
  Filled 2022-09-07: qty 1

## 2022-09-07 MED ORDER — SODIUM CHLORIDE 0.9 % IV SOLN
3.0000 g | Freq: Two times a day (BID) | INTRAVENOUS | Status: DC
  Administered 2022-09-07 (×2): 3 g via INTRAVENOUS
  Filled 2022-09-07 (×2): qty 8

## 2022-09-07 MED ORDER — FOLIC ACID 1 MG PO TABS
1.0000 mg | ORAL_TABLET | Freq: Every day | ORAL | Status: DC
  Administered 2022-09-07: 1 mg via ORAL
  Filled 2022-09-07: qty 1

## 2022-09-07 MED ORDER — CARVEDILOL 12.5 MG PO TABS
12.5000 mg | ORAL_TABLET | Freq: Two times a day (BID) | ORAL | Status: DC
  Administered 2022-09-07: 12.5 mg via ORAL
  Filled 2022-09-07: qty 1

## 2022-09-07 MED ORDER — ALTEPLASE 2 MG IJ SOLR: 2.0000 mg | Freq: Once | INTRAMUSCULAR | Status: DC | PRN

## 2022-09-07 MED ORDER — THIAMINE HCL 100 MG/ML IJ SOLN
500.0000 mg | Freq: Every day | INTRAVENOUS | Status: AC
  Administered 2022-09-07 – 2022-09-09 (×3): 500 mg via INTRAVENOUS
  Filled 2022-09-07 (×4): qty 5

## 2022-09-07 MED ORDER — INSULIN GLARGINE-YFGN 100 UNIT/ML ~~LOC~~ SOLN
10.0000 [IU] | Freq: Every day | SUBCUTANEOUS | Status: DC
  Administered 2022-09-07 – 2022-09-08 (×2): 10 [IU] via SUBCUTANEOUS
  Filled 2022-09-07 (×2): qty 0.1

## 2022-09-07 MED ORDER — STERILE WATER FOR INJECTION IJ SOLN
INTRAMUSCULAR | Status: AC
  Administered 2022-09-07: 0.5 mL
  Filled 2022-09-07: qty 10

## 2022-09-07 MED ORDER — MIDODRINE HCL 5 MG PO TABS
10.0000 mg | ORAL_TABLET | ORAL | Status: DC
  Administered 2022-09-07: 10 mg
  Filled 2022-09-07: qty 2

## 2022-09-07 MED ORDER — PENTAFLUOROPROP-TETRAFLUOROETH EX AERO: 1.0000 | INHALATION_SPRAY | CUTANEOUS | Status: DC | PRN

## 2022-09-07 MED ORDER — THIAMINE MONONITRATE 100 MG PO TABS
100.0000 mg | ORAL_TABLET | Freq: Every day | ORAL | Status: DC
  Administered 2022-09-07: 100 mg
  Filled 2022-09-07: qty 1

## 2022-09-07 MED ORDER — INSULIN ASPART 100 UNIT/ML IJ SOLN
2.0000 [IU] | INTRAMUSCULAR | Status: DC
  Administered 2022-09-07: 6 [IU] via SUBCUTANEOUS
  Administered 2022-09-07: 4 [IU] via SUBCUTANEOUS
  Administered 2022-09-07: 6 [IU] via SUBCUTANEOUS
  Administered 2022-09-08: 2 [IU] via SUBCUTANEOUS
  Administered 2022-09-08: 4 [IU] via SUBCUTANEOUS
  Administered 2022-09-08: 2 [IU] via SUBCUTANEOUS
  Administered 2022-09-08: 4 [IU] via SUBCUTANEOUS
  Administered 2022-09-08: 2 [IU] via SUBCUTANEOUS
  Administered 2022-09-09 (×3): 4 [IU] via SUBCUTANEOUS

## 2022-09-07 MED ORDER — LIDOCAINE HCL (PF) 1 % IJ SOLN: 5.0000 mL | INTRAMUSCULAR | Status: DC | PRN

## 2022-09-07 MED ORDER — FUROSEMIDE 10 MG/ML IJ SOLN
100.0000 mg | Freq: Once | INTRAVENOUS | Status: AC
  Administered 2022-09-07: 100 mg via INTRAVENOUS
  Filled 2022-09-07: qty 10

## 2022-09-07 MED ORDER — CARVEDILOL 12.5 MG PO TABS
12.5000 mg | ORAL_TABLET | Freq: Two times a day (BID) | ORAL | Status: DC
  Administered 2022-09-08: 12.5 mg
  Filled 2022-09-07: qty 1

## 2022-09-07 MED ORDER — ANTICOAGULANT SODIUM CITRATE 4% (200MG/5ML) IV SOLN
5.0000 mL | Status: DC | PRN
  Filled 2022-09-07: qty 5

## 2022-09-07 MED ORDER — CARVEDILOL 12.5 MG PO TABS: 12.5000 mg | ORAL_TABLET | Freq: Two times a day (BID) | ORAL | Status: DC

## 2022-09-07 MED ORDER — LIDOCAINE-PRILOCAINE 2.5-2.5 % EX CREA: 1.0000 | TOPICAL_CREAM | CUTANEOUS | Status: DC | PRN

## 2022-09-07 MED ORDER — FOLIC ACID 1 MG PO TABS
1.0000 mg | ORAL_TABLET | Freq: Every day | ORAL | Status: DC
  Administered 2022-09-08 – 2022-09-09 (×2): 1 mg
  Filled 2022-09-07 (×2): qty 1

## 2022-09-07 MED ORDER — HEPARIN SODIUM (PORCINE) 1000 UNIT/ML DIALYSIS: 1000.0000 [IU] | INTRAMUSCULAR | Status: DC | PRN

## 2022-09-07 MED ORDER — IPRATROPIUM-ALBUTEROL 0.5-2.5 (3) MG/3ML IN SOLN: 3.0000 mL | RESPIRATORY_TRACT | Status: DC | PRN

## 2022-09-07 NOTE — Evaluation (Signed)
Clinical/Bedside Swallow Evaluation Patient Details  Name: Douglas Ballard MRN: [id number] Date of Birth: 1950/12/08  Today's Date: 09/07/2022 Time: SLP Start Time (ACUTE ONLY): 0954 SLP Stop Time (ACUTE ONLY): 1010 SLP Time Calculation (min) (ACUTE ONLY): 16 min  Past Medical History:  Past Medical History:  Diagnosis Date   Alcohol withdrawal seizure ([place]) 06/28/2011   Alcohol withdraw seizure prior to admission is suspected from history given by family & Post ictal appearance in the ED.    Anxiety    Atrial fibrillation ([place])    Atypical mole 05/13/2008   LEFT MEDIAL CHEST SLIGHT/MOD.   Atypical mole 10/12/2020   Left Upper Arm-Posterior (moderate to severe)   Atypical mole 10/12/2020   Right Mid Back (Moderate)   Atypical nevi 05/13/2008   LEFT LATERAL CHEST SLIGHT   Atypical nevi 04/29/2015   RIGHT POST SHOULDER MOD/SEV. [place] W/S   Atypical nevi 04/29/2015   RIGHT MID ABDOMEN MODERATE [place] W/S   Atypical nevi 09/27/2015   LEFT UPPER ARM SEVERE TX W/S   Atypical nevi 09/27/2015   MID LOWER BACK MODERATE   Atypical nevi 09/27/2015   MID UPPER BACK SEVERE TX EXC   Atypical nevi 09/27/2015   LEFT UPPER BACK MOD.SEV TX EXC   Atypical nevi 03/23/2016   RIGHT LOWER BACK MILD   Atypical nevi 03/23/2016   LEFT SIDE TORSO MILD   Atypical nevi 06/14/2016   LEFT LOWER BACK MOD/SEV    Atypical nevi 06/14/2016   LEFT CHEST MODERATE   Atypical nevi 08/30/2017   MID UPPER BACK SUP MODERATE   Atypical nevi 08/30/2017   RIGHT CHEST SEVERE    Atypical nevi 07/25/2018   LEFT SHOULDER ANTERIOR TX WIDERSHAVE   Atypical nevi 07/25/2018   LEFT SHOULDER POST MODERATE   Atypical nevi 08/25/2019   LEFT SIDE ABDOMEN MODERATE FREE   Atypical nevi 11/27/2019   RIGHT SHOULDER MODERATE TX W/S   Atypical nevi 11/27/2019   RIGHT UPPER BACK MOD/SEVERE TX W/S   Atypical nevi 11/27/2019   LEFT MID BACK SEVERE TX W/S   Cancer ([place]) 2012   Prostate surgery   Chronic diastolic CHF (congestive  heart failure) ([place]) 11/01/2014   Echo 8/15: Mild LVH, EF 50-55%, moderate BAE  //  b. Echo 7/17: EF 55-60%, normal wall motion, grade 2 diastolic dysfunction, MAC, mild MR, moderate LAE, trivial PI   Chronic kidney disease    Compression fracture of thoracic vertebra ([place]) 06/26/2011   Diabetes mellitus type 2 in nonobese (HCC)    Fistula, bladder    Frequent urination at night    History of adenomatous polyp of colon 06/14/2014   History of nuclear stress test    a. Myoview 10/15: Overall Impression:  Low risk stress nuclear study demonstrating mild baseline ST-T changes with normal myocardial perfusion and low normal EF of 50%. // b.Myoview 7/17: EF 50%, no ischemia or scar, low risk (EF normal by recent echo)   Hypertension    PNA (pneumonia) 06/26/2011   Sepsis due to urinary tract infection ([place]) 04/16/2014   Stroke ([place]) sept 1, 2015   tia x 3   Past Surgical History:  Past Surgical History:  Procedure Laterality Date   BIOPSY  03/16/2020   Procedure: BIOPSY;  Surgeon: Lavena Bullion, DO;  Location: [facility];  Service: Gastroenterology;;   COLON SURGERY     COLONOSCOPY N/A 06/14/2014   Procedure: COLONOSCOPY;  Surgeon: Gatha Mayer, MD;  Location: [facility];  Service: Endoscopy;  Laterality: N/A;  COLONOSCOPY WITH PROPOFOL N/A 03/16/2020   Procedure: COLONOSCOPY WITH PROPOFOL;  Surgeon: Lavena Bullion, DO;  [facility];  Service: Gastroenterology;  Laterality: N/A;   CYSTOSCOPY WITH STENT PLACEMENT Bilateral 06/15/2014   Procedure: CYSTOSCOPY WITH BILATERAL STENT PLACEMENT;  Surgeon: Bernestine Amass, MD;  Location: [facility];  Service: Urology;  Laterality: Bilateral;   ESOPHAGOGASTRODUODENOSCOPY (EGD) WITH PROPOFOL N/A 03/16/2020   Procedure: ESOPHAGOGASTRODUODENOSCOPY (EGD) WITH PROPOFOL;  Surgeon: Lavena Bullion, DO;  [facility];  Service: Gastroenterology;  Laterality: N/A;   LIPOMA EXCISION  2012   Dr Nonah Mattes   moles removed     Back    POLYPECTOMY  03/16/2020   Procedure: POLYPECTOMY;  Surgeon: Lavena Bullion, DO;  [facility];  Service: Gastroenterology;;   PROCTOSCOPY N/A 06/15/2014   Procedure: RIGID PROCTOSCOPY;  Surgeon: Michael Boston, MD;  Location: [facility];  Service: General;  Laterality: N/A;   RADIOLOGY WITH ANESTHESIA N/A 07/28/2014   Procedure: Embolization;  Surgeon: Luanne Bras, MD;  Location: [facility];  Service: Radiology;  Laterality: N/A;   ROBOT ASSISTED LAPAROSCOPIC RADICAL PROSTATECTOMY  01/31/2011   Robotic-assisted laparoscopic radical retropubic   HPI:  Douglas Ballard is 72 year old male who presented from home by EMS after unwitnessed fall.  Patient reported to EMS, fell out of bed due to weakness while trying to go the bathroom, denied LOC, and sustained a small laceration to the left temporal area.  Additionally complained of nausea and vomiting for one month in which he has not had evaluated.  Pt with concerns for ETOH withdrawal in ED, given ativan. Pt with prior hx significant for Afib on Xarelto, HFpEF, CKD IV, alcohol use disorder, and DMT2.    Assessment / Plan / Recommendation  Clinical Impression  Pt presents with clinical indicators of pharyngeal dysphagia.  With ice chip there was delayed oral response, anterior spillage from R side of labial seal, and no pharyngeal swallow was palpated following eventual mastication.  Pt tolerated small amount of water by teaspoon with pharyngeal swallow reflex observed to palpation. With small amount of water by cup there was seemingly delayed swallow response, pt was noted to blow bubbles into water, and there was wet vocal quality and slightly delayed cough. With puree there was attempted excpectoration of bolus with blowing behavior again noted and no attempt to transit puree orally.  Bolus removed via suction.  Pt is not appropriate for instrumental swallow evaluation at this time.  Pt with thick, copious, dried brown secretions in oral cavity,  especially on hard palated, but noted on dorsal lingual surface and lips as well.  SLP provided agressive oral care prior to administration of bolus trials and removed a large amount of dried secretions.  Continue regular, thorough oral care.  Pt may have ice chips to assist in mobilization of secretions and for comfort after good oral care, in moderation, when fully awake/alert, with upright positioning and 1:1 assistance.    Recommend pt remain NPO with alternate means of nutrition, hydration, and medication at this time.  SLP Visit Diagnosis: Dysphagia, oropharyngeal phase (R13.12)    Aspiration Risk  Moderate aspiration risk    Diet Recommendation NPO   Medication Administration: Via alternative means    Other  Recommendations Oral Care Recommendations: Oral care QID;Oral care prior to ice chip/H20    Recommendations for follow up therapy are one component of a multi-disciplinary discharge planning process, led by the attending physician.  Recommendations may be updated based on  patient status, additional functional criteria and insurance authorization.  Follow up Recommendations  (Continue ST at next level of care)      Assistance Recommended at Discharge  N/A  Functional Status Assessment Patient has had a recent decline in their functional status and demonstrates the ability to make significant improvements in function in a reasonable and predictable amount of time.  Frequency and Duration min 2x/week  2 weeks       Prognosis Prognosis for Safe Diet Advancement: Good      Swallow Study   General HPI: Douglas Ballard is 72 year old male who presented from home by EMS after unwitnessed fall.  Patient reported to EMS, fell out of bed due to weakness while trying to go the bathroom, denied LOC, and sustained a small laceration to the left temporal area.  Additionally complained of nausea and vomiting for one month in which he has not had evaluated.  Pt with concerns for ETOH  withdrawal in ED, given ativan. Pt with prior hx significant for Afib on Xarelto, HFpEF, CKD IV, alcohol use disorder, and DMT2. Type of Study: Bedside Swallow Evaluation Previous Swallow Assessment: None Diet Prior to this Study: NPO Temperature Spikes Noted: No Respiratory Status: Nasal cannula History of Recent Intubation: No Behavior/Cognition: Alert;Cooperative;Confused Oral Cavity Assessment: Dry;Dried secretions;Excessive secretions Oral Care Completed by SLP: Yes Oral Cavity - Dentition: Adequate natural dentition Self-Feeding Abilities: Total assist Patient Positioning: Upright in bed Baseline Vocal Quality: Normal Volitional Cough: Cognitively unable to elicit Volitional Swallow: Unable to elicit    Oral/Motor/Sensory Function Overall Oral Motor/Sensory Function:  (Could not assess 2/2 inability to follow commands) Facial Symmetry: Within Functional Limits   Ice Chips Ice chips: Impaired Oral Phase Impairments: Reduced labial seal Oral Phase Functional Implications: Oral holding;Right anterior spillage   Thin Liquid Thin Liquid: Impaired Presentation: Spoon;Cup Oral Phase Impairments: Reduced labial seal Oral Phase Functional Implications: Left anterior spillage Pharyngeal  Phase Impairments: Suspected delayed Swallow;Cough - Immediate;Wet Vocal Quality    Nectar Thick Nectar Thick Liquid: Not tested   Honey Thick Honey Thick Liquid: Not tested   Puree Puree: Impaired Oral Phase Impairments: Poor awareness of bolus Oral Phase Functional Implications: Oral holding Other Comments: removed via suction   Solid     Solid: Not tested      Douglas Savage, MA, [place] Office: [phone] 09/07/2022,11:05 AM

## 2022-09-07 NOTE — Procedures (Signed)
Cortrak  Person Inserting Tube:  Vinita Prentiss, RD Tube Type:  Cortrak - 43 inches Tube Size:  10 Tube Location:  Right nare Secured by: Bridle Technique Used to Measure Tube Placement:  Marking at nare/corner of mouth Cortrak Secured At:  68 cm Procedure Comments:  Cortrak Tube Team Note:  Consult received to place a Cortrak feeding tube.   X-ray is required, abdominal x-ray has been ordered by the Cortrak team. Please confirm tube placement before using the Cortrak tube.   If the tube becomes dislodged please keep the tube and contact the Cortrak team at [email or web address] for replacement.  If after hours and replacement cannot be delayed, place a NG tube and confirm placement with an abdominal x-ray.    Douglas Sambrano MS, RDN, LDN, CNSC Registered Dietitian 3 Clinical Nutrition RD Pager and On-Call Pager Number Located in Amion       

## 2022-09-07 NOTE — Progress Notes (Addendum)
NAME:  Douglas Ballard, MRN:  [id number], DOB:  September 15, 1950, LOS: 8 ADMISSION DATE:  09/16/2022, CONSULTATION DATE:  08/26/2022 REFERRING MD:  Dr. Francia Greaves, CHIEF COMPLAINT:  fall   History of Present Illness:   72 year old male with prior hx as below, significant for Afib on Xarelto, HFpEF, CKD IV, and DMT2 presented from home by EMS after unwitnessed fall.  Patient reported to EMS, fell out of bed due to weakness while trying to go the bathroom, denied LOC, and sustained a small laceration to the left temporal area.  Additionally complained of nausea and vomiting for one month in which he has not had evaluated.  Was alert and oriented initially and denied any neck or back pain.   In ER, patient found in afib with RVR, no temperature available yet, normoxia, and SBP 130-150's.  While in ER patient c/o of neck, back, and ankle pain then became more agitated, altered and tremulous with concern for ETOH withdrawal.  Given ativan '1mg'$  twice.  Additionally found to be in Afib with RVR, placed on cardizem gtt. Labs noted for Na 113, Cl 79, sCr 4.5, BUN/ sCr 64/ 5.11 (prior 54/ 3.26 04/2021), bicarb 15, AG 19, glucose 179, AST/ ALT 712/ 553, t. Bili 2.1, BNP 2970, trop hs 36, WBC 19.8, Hgb 10.3, Hct 28, plts 185, neg ETOH level, EKG afib with incomplete RBBB, rate 125, Qtc 579.  CTH and cervical neg for acute fx or intracranial process. CXR showed cardiomegaly with mild pulmonary venous congestion and possible early right perihilar opacity.  XR left/ right ankle neg, lumbar and pelvis neg for fx.  He has received 1L LR in ER.  PCCM called for admit.  Patient remains less talkative after ativan, remains tremulous, and confused.   Pertinent  Medical History  Former smoker, Afib on Xarelto, ETOH use, HTN, HFpEF, CKD IV, DMT2, anemia of chronic disease, colovesical fistula  Significant Hospital Events: Including procedures, antibiotic start and stop dates in addition to other pertinent events   1/11 admitted  1/12  sodium level improved on 3% saline, bicarb drip started 1/13 3% saline restarted 1/15 developed septic shock, DIC, started on CRRT. HD cath and CVL placed. 1/16 off Levophed  Interim History / Subjective:  Patient with increased work of breathing, currently on high flow nasal cannula oxygen at 100% on 60 liters Remain in A-fib with controlled rate Overnight went into A-fib with RVR requiring diltiazem infusion  Objective   Blood pressure 104/70, pulse 83, temperature 97.9 F (36.6 C), temperature source Axillary, resp. rate (!) 24, height '5\' 9"'$  (1.753 m), weight 77.6 kg, SpO2 92 %.    FiO2 (%):  [100 %] 100 %   Intake/Output Summary (Last 24 hours) at 09/07/2022 0904 Last data filed at 09/07/2022 0600 Gross per 24 hour  Intake 1359.87 ml  Output 190 ml  Net 1169.87 ml   Filed Weights   09/04/22 0106 09/05/22 0500 09/06/22 0500  Weight: 77.8 kg 76.1 kg 77.6 kg   Examination:   Physical exam: General: Crtitically ill-appearing male, on high flow nasal cannula oxygen HEENT: [place]/AT, eyes anicteric. NGT in place Neuro: Awake and confused, intermittently following commands Chest: Bilateral basal crackles right more than left, scattered wheezes noted Heart: IRegular irregular, no murmurs or gallops Abdomen: Soft, nondistended, bowel sounds present Skin: No rash  Labs: Sodium 137 Potassium 4.1 BUN 72 Creatinine 3.6 WBC 21.3 H/H8.3/24.1 Platelet 22 INR 2.7  X-ray chest 1/19: Reviewed myself showing increased right lower lobe opacity with  bilateral scattered infiltrates  Resolved Hospital Problem list   Hypomagnesemia Hypernatremia Anion Gap Metabolic Acidosis lactic acidosis,  Assessment & Plan:  Severe sepsis with septic Shock due to aspiration pneumonia Shock has resolved, remains off vasopressors One of the 2 blood culture bottle positive for staph epi, likely contaminant Repeat blood cultures are negative Stop ceftriaxone Due to worsening infiltrate we will  switch antibiotics to Unasyn Keep n.p.o.  Acute toxic/septic encephalopathy ETOH withdrawal Uremia Patient is done with goal withdrawal Continue thiamine Continue CIWA protocol Stop Precedex Avoid sedation  Acute Hypoxemic Respiratory Failure Aspiration PNA Small bilateral Pleural Effusions Patient is on high flow nasal cannula oxygen at 100% FiO2 and 60 L Titrate oxygen with O2 sat goal 92% Started on Unasyn So far cultures have been negative  AKI on CKD stage IV, baseline creatinine 4.0 December 2023 Hypocalcemia Patient is dialysis dependent now He remained anuric CRRT was stopped He is scheduled for intermittent hemodialysis for session today  Acute Systolic Heart Failure, 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 Paroxysmal Afib with RVR HTN Patient new echocardiogram showed EF 20 to 25% Due to multiple organ dysfunction, cardiology recommended conservative management Remain in A-fib, now rate is controlled Overnight he started on diltiazem infusion which was stopped this morning Started on Coreg 12.5 mg twice daily Does not make much urine so diuretic will not help He will receive dialysis with plan to remove fluid He has severe thrombocytopenia, unable to anticoagulate him for stroke prophylaxis Blood pressure is controlled Will try to add Imdur if blood pressure remains elevated as of now we will hold off Stop labetalol  DIC was ruled out with a normal fibrinogen Coagulopathy of liver disease Severe thrombocytopenia in the setting of liver disease and bone marrow suppression from sepsis Acute blood loss anemia on anemia of chronic disease illness Patient is s/p 2 unit PRBCs, cryo, platelet and FFP I do not think he has DIC Monitor H&H, platelet and coags  Prolonged QTC Continue telemetry monitoring Monitor electrolytes  DM type II Patient's hemoglobin A1c is 6.8 Continue sliding scale insulin with CBG goal 140-180  Left temporal scalp laceration s/p staple 2/2  fall  Staple removal in 7-10 days   Severe protein calorie malnutrition Cortrak was placed Continue dietary supplements  Best Practice (right click and "Reselect all SmartList Selections" daily)   Diet/type: tubefeeds and NPO DVT prophylaxis: not indicated GI prophylaxis: PPI Lines: Central line discontinued, requires HD catheter Foley: Discontinue Code Status: DNR Last date of multidisciplinary goals of care discussion: 1/19, please see Ipal note  Labs   CBC: Recent Labs  Lab 09/03/22 1042 09/04/22 0333 09/05/22 0309 09/06/22 0524 09/06/22 1504 09/07/22 0530  WBC 13.9* 16.4* 15.6* 20.2* 21.3*  --   NEUTROABS 11.6*  --   --   --   --   --   HGB 8.0* 8.4* 8.3* 8.6* 8.3* 9.5*  HCT 23.8* 24.2* 24.3* 25.2* 24.1* 28.0*  MCV 94.8 88.6 91.4 92.3 92.0  --   PLT 61* 43*  40* 26* 14* 22*  --     Basic Metabolic Panel: Recent Labs  Lab 09/02/22 1835 09/02/22 2037 09/04/22 0333 09/04/22 1730 09/05/22 0309 09/05/22 1600 09/06/22 0524 09/07/22 0530 09/07/22 0558 09/07/22 0559  NA 128*   < > 135 136 136 135 137  135 138 137  --   K 4.8   < > 3.4* 3.5 3.6 3.8 4.0  3.9 4.2 4.1  --   CL 95*   < > 100 101 103  103 104  102  --  104  --   CO2 11*   < > 22 23 21* 23 19*  20*  --  23  --   GLUCOSE 120*   < > 164* 152* 152* 135* 150*  150*  --  256*  --   BUN 91*   < > 73* 45* 38* 33* 46*  46*  --  72*  --   CREATININE 6.14*   < > 3.42* 2.23* 1.98* 1.56* 2.31*  2.29*  --  3.67*  --   CALCIUM 6.6*   < > 6.5* 7.1* 7.1* 7.3* 7.7*  7.5*  --  7.7*  --   MG 2.0  --  2.3  --  2.3  --  2.3  --   --  2.4  PHOS  --    < > 4.3 2.9 2.6 2.4* 2.5  --  3.1  --    < > = values in this interval not displayed.   GFR: Estimated Creatinine Clearance: 18.5 mL/min (A) (by C-G formula based on SCr of 3.67 mg/dL (H)). Recent Labs  Lab 09/03/22 1037 09/03/22 1042 09/03/22 1555 09/03/22 1839 09/03/22 2255 09/04/22 0333 09/05/22 0309 09/06/22 0524 09/06/22 1504  PROCALCITON  --   --   0.90  --   --   --   --   --   --   WBC  --    < >  --   --   --  16.4* 15.6* 20.2* 21.3*  LATICACIDVEN >9.0*  --   --  5.7* 3.9* 2.1*  --   --   --    < > = values in this interval not displayed.    Liver Function Tests: Recent Labs  Lab 09/01/22 2345 09/02/22 0841 09/03/22 1042 09/03/22 1555 09/04/22 0333 09/04/22 1730 09/05/22 0309 09/05/22 0603 09/05/22 1600 09/06/22 0524 09/07/22 0558  AST 718*  --  2,030*  --  859*  --   --  354*  --  157*  --   ALT 914*  --  1,576*  --  1,027*  --   --  669*  --  480*  --   ALKPHOS 128*  --  113  --  93  --   --  94  --  97  --   BILITOT 2.4*  --  3.4*  --  4.0*  --   --  4.8*  --  7.5*  --   PROT 6.4*  --  5.5*  --  4.7*  --   --  4.5*  --  5.3*  --   ALBUMIN 2.9*   < > 2.6*   < > 2.4*  2.3*   < > 2.3* 2.2* 2.4* 2.4*  2.3* 2.0*   < > = values in this interval not displayed.   Recent Labs  Lab 09/03/22 1042  LIPASE 81*   Recent Labs  Lab 09/03/22 1555  AMMONIA 89*    ABG    Component Value Date/Time   PHART 7.384 09/07/2022 0530   PCO2ART 34.4 09/07/2022 0530   PO2ART 65 (L) 09/07/2022 0530   HCO3 20.6 09/07/2022 0530   TCO2 22 09/07/2022 0530   ACIDBASEDEF 4.0 (H) 09/07/2022 0530   O2SAT 93 09/07/2022 0530     Coagulation Profile: Recent Labs  Lab 09/03/22 0904 09/03/22 1042 09/04/22 0333 09/06/22 0524  INR 6.8* 6.8* 4.0*  3.9* 2.7*    Cardiac Enzymes: No results for input(s): "CKTOTAL", "CKMB", "  CKMBINDEX", "TROPONINI" in the last 168 hours.  HbA1C: Hgb A1c MFr Bld  Date/Time Value Ref Range Status  05/16/2021 11:47 AM 6.8 (H) 4.6 - 6.5 % Final    Comment:    Glycemic Control Guidelines for People with Diabetes:Non Diabetic:  <6%Goal of Therapy: <7%Additional Action Suggested:  >8%   03/14/2020 10:16 AM 5.9 (H) 4.8 - 5.6 % Final    Comment:    (NOTE) Pre diabetes:          5.7%-6.4%  Diabetes:              >6.4%  Glycemic control for   <7.0% adults with diabetes     CBG: Recent Labs  Lab  09/06/22 1941 09/06/22 2132 09/06/22 2345 09/07/22 0350 09/07/22 0818  GLUCAP 226* 240* 249* 267* 250*   This patient is critically ill with multiple organ system failure which requires frequent high complexity decision making, assessment, support, evaluation, and titration of therapies. This was completed through the application of advanced monitoring technologies and extensive interpretation of multiple databases.  During this encounter critical care time was devoted to patient care services described in this note for 44 minutes.    [name], MD [facility] See Amion for pager If no response to pager, please call [phone] until 7pm After 7pm, Please call E-link [phone]

## 2022-09-07 NOTE — Progress Notes (Signed)
   09/07/22 2000  Vitals  Temp 98.2 F (36.8 C)  Temp Source Axillary  BP 111/80  MAP (mmHg) 89  BP Location Right Arm  BP Method Automatic  Patient Position (if appropriate) Lying  Pulse Rate (!) 126  Pulse Rate Source Monitor  ECG Heart Rate (!) 123  Resp (!) 22  Oxygen Therapy  SpO2 90 %  O2 Device HFNC  Post Treatment  Dialyzer Clearance Lightly streaked  Duration of HD Treatment -hour(s) 3.5 hour(s)  Liters Processed 73.4  Fluid Removed (mL) 2500 mL  Tolerated HD Treatment Yes  Post-Hemodialysis Comments goal met tolerated well on 1st hd treatment  Note  Observations no changes 2.5 L removed  Hemodialysis Catheter Right Internal jugular  Placement Date/Time: 09/03/22 1200   Orientation: Right  Access Location: Internal jugular  Site Condition No complications  Blue Lumen Status Dead end cap in place;Blood return noted  Red Lumen Status Blood return noted;Dead end cap in place  Purple Lumen Status Infusing  Catheter fill solution 4% Sodium Citrate  Catheter fill volume (Arterial) 1.2 cc  Catheter fill volume (Venous) 1.2  Dressing Type Transparent  Dressing Status Antimicrobial disc in place  Interventions Dressing reinforced  Drainage Description None  Post treatment catheter status Capped and Clamped

## 2022-09-07 NOTE — Progress Notes (Addendum)
CSW acknowledges PT recommendation. CSW following to discuss PT recommendations/dc plans when appropriate. CSW will continue to follow and assist with patients dc planning needs.

## 2022-09-07 NOTE — TOC CAGE-AID Note (Signed)
Transition of Care [facility]) - CAGE-AID Screening   Patient Details  Name: Douglas Ballard MRN: [id number] Date of Birth: 11/13/1950  [facility]) CM/SW Contact:    Bethann Berkshire, [place] Phone Number: 09/07/2022, 9:18 AM    CAGE-AID Screening: Substance Abuse Screening unable to be completed due to: : Patient unable to participate

## 2022-09-07 NOTE — Progress Notes (Signed)
Nutrition Follow-up  DOCUMENTATION CODES:   Severe malnutrition in context of chronic illness  INTERVENTION:  Initiate trickle tube feeds via Cortrak tube: -Provide Vital 1.5 at 20 mL/hour -Continue PROSource TF20 60 mL daily  Once appropriate for advancement of tube feeds recommend: -Advance Vital 1.5 by 10 mL/hour every 8 hours to goal rate of 60 mL/hour -Continue PROSource TF20 60 mL daily -Goal regimen provides 2240 kcal, 117 grams of protein, 1094 ml H2O daly  Monitor magnesium, potassium, and phosphorus BID for at least 3 days, MD to replete as needed, as pt is at risk for refeeding syndrome. Pt will continue to be at risk for refeeding syndrome until tube feeds are at goal rate.  Recommend providing high-dose IV thiamine in setting of history of EtOH abuse and current AMS. Can then transition back to thiamine 100 mg daily per tube.  Provide Rena-vite QHS per tube.  Continue folic acid 1 mg daily.  Will check the following vitamin/mineral labs due to risk for deficiency: CRP, vitamin A, vitamin D, vitamin E, vitamin B6, vitamin B12, copper, zinc As pt has already been receiving folate and thiamine will not check these at this time.  NUTRITION DIAGNOSIS:   Severe Malnutrition related to chronic illness (HF, CKD IV, EtOH use) as evidenced by moderate fat depletion, severe fat depletion, severe muscle depletion, energy intake < or equal to 75% for > or equal to 1 month, edema.  Updated nutrition diagnosis.  GOAL:   Patient will meet greater than or equal to 90% of their needs  Not met at this time.  MONITOR:   Diet advancement, Labs, Weight trends, TF tolerance, I & O's  REASON FOR ASSESSMENT:   Consult Enteral/tube feeding initiation and management  ASSESSMENT:   72 year old male with prior hx significant for ETOH use, HTN, HFpEF, CKD IV, colovesical fistula, and DM type 2 presented from home by EMS after unwitnessed fall.  1/15: developed septic shock, started  on CRRT; NG tube placed; pt made NPO 1/17: tube feeds initiated 1/18: stopped CRRT, transferred from [facility] for transition to [facility] 1/19: recommendation to remain NPO per SLP; NG tube replaced with Cortrak tube  Met with pt, wife, and daughter at bedside. Pt confused at this assessment. He is on HFNC 50 L/min 100% FiO2. Wife and daughter report pt has had poor appetite and intake for several months now. For breakfast he has a Boost High Protein. At lunch he may have a [employer] cheeseburger. Dinner is prepared at home by family, but pt only takes bites at dinner. RD estimates pt has been eating at most 861 kcal (39% minimum estimated kcal needs) and 52 grams of protein (47% minimum estimated protein needs).  Family reports they suspect pt has lost weight but are unsure of recent weight trends. They report weight has been around 150 lbs recently but pt's weight has been as high as 170 lbs. Per review of chart pt was 70.9-71.8 kg earlier this year. Pt was 71.8 kg on 04/20/22. Suspect current wt 77.6 kg falsely elevated with fluid. Will continue to monitor trend.  Enteral Access: 10 Fr. Cortrak tube placed 09/07/22; 68 cm right nare secured with bridle; tube terminates in distal gastric body to antrum per abdominal x-ray 1/19  Medications reviewed and include: carvedilol, Vital 1.5 (held at transfer from [facility] to [facility]), PROSource TF 60 mL daily (held at transfer from [facility] to [facility]), folic acid 1 mg daily, Novolog 2-6 units every 4 hours, Semglee 10  units daily, MVI daily, pantoprazole, thiamine 100 mg daily, Unasyn  Labs reviewed: CBG 250-267, BUN 72, Creatinine 3.67  UOP: 190 mL (0.1 mL/kg/hr)  I/O: +4996.2 mL since admission  Discussed with MD. Plan is to resume tube feeds at trickle rate today in setting of respiratory status.  NUTRITION - FOCUSED PHYSICAL EXAM:  Flowsheet Row Most Recent Value  Orbital Region Moderate depletion  Upper Arm Region Moderate depletion  [on right upper extremity, unable to  assess left upper extremity due to edema]  Thoracic and Lumbar Region Severe depletion  Buccal Region Severe depletion  Temple Region Moderate depletion  Clavicle Bone Region Moderate depletion  Clavicle and Acromion Bone Region Severe depletion  Scapular Bone Region Severe depletion  Dorsal Hand Unable to assess  [edema]  Patellar Region Severe depletion  Anterior Thigh Region Severe depletion  Posterior Calf Region Severe depletion  Edema (RD Assessment) Severe  Hair Reviewed  Eyes Reviewed  Mouth Unable to assess  Skin Reviewed  Nails Reviewed      Diet Order:   Diet Order             Diet NPO time specified  Diet effective now                  EDUCATION NEEDS:   No education needs have been identified at this time  Skin:  Skin Assessment: Skin Integrity Issues: Skin Integrity Issues:: Other (Comment) Other: laceration left temporal region; weeping to bilateral arms  Last BM:  09/03/22  Height:   Ht Readings from Last 1 Encounters:  09/04/2022 '5\' 9"'$  (1.753 m)   Weight:   Wt Readings from Last 1 Encounters:  09/06/22 77.6 kg   Ideal Body Weight:  72.7 kg  BMI:  Body mass index is 25.26 kg/m.  Estimated Nutritional Needs:   Kcal:  2200-2400  Protein:  110-130 grams  Fluid:  UOP + 1 L  Kymiah Araiza Magda Paganini, MS, RD, LDN, CNSC Pager number available on Amion

## 2022-09-07 NOTE — Progress Notes (Signed)
   Palliative Medicine Inpatient Follow Up Note   HPI: Patient is a 72 year old male with a past medical history of A-fib on Xarelto, CHF, CKD stage IV, diabetes mellitus type 2, alcoholism, and prior tobacco use who was admitted on 08/29/2022 for management after unwitnessed fall. Patient found to be altered upon admission. During hospitalization, patient required transfer to ICU for management of septic shock due to pneumonia requiring pressor support as well as AKI on CKD requiring CRRT. Patient also had repeat echo which is showing EF now 25-25%. Nephrology and cardiology following along with patient's course. Medicine team consulted to assist with complex medical decision making.    Today's Discussion 09/07/2022  *Please note that this is a verbal dictation therefore any spelling or grammatical errors are due to the "[employer]" system interpretation.  Chart reviewed inclusive of vital signs, progress notes, laboratory results, and diagnostic images.   Douglas Ballard is assessed at bedside this morning, he is quite confused and unable to tell me his full name.   The RD was removing his large NGT and replacing it with a coretrack.   Patients wife and daughter were present. I shared my name and role. Created space and opportunity for patient wife and daughter to explore thoughts feelings and fears regarding current medical situation. Patient spouse was very tearful expressing feelings of being overwhelmed. Offered support through reflective listening.   Plan to continue present care at this time.   Questions and concerns addressed/Palliative Support Provided.   Objective Assessment: Vital Signs Vitals:   09/07/22 0700 09/07/22 0822  BP: 104/70   Pulse: 83   Resp: (!) 24   Temp:  97.9 F (36.6 C)  SpO2: 92%     Intake/Output Summary (Last 24 hours) at 09/07/2022 1121 Last data filed at 09/07/2022 0900 Gross per 24 hour  Intake 1019.49 ml  Output 440 ml  Net 579.49 ml   Last  Weight  Most recent update: 09/06/2022  5:26 AM    Weight  77.6 kg (171 lb 1.2 oz)            Gen:  Frail elderly caucasian M chronically ill appearing HEENT: Dry mucous membranes CV: Regular rate and rhythm  PULM:  On 50LPM HFNC breathing is even and nonlabored ABD: soft/nontender  EXT: BUE edema Neuro: Opens eyes and responds though is disoriented  SUMMARY OF RECOMMENDATIONS   DNAR/DNI  Continue current care - allowing time for outcomes  Strict Delirium precautions  Offering of support to patients spouse  PMT will continue to follow  Billing based on MDM: High ______________________________________________________________________________________ [facility] Team Team Cell Phone: [phone] Please utilize secure chat with additional questions, if there is no response within 30 minutes please call the above phone number  Palliative Medicine Team providers are available by phone from 7am to 7pm daily and can be reached through the team cell phone.  Should this patient require assistance outside of these hours, please call the patient's attending physician.

## 2022-09-07 NOTE — IPAL (Signed)
Interdisciplinary Goals of Care Family Meeting   Date carried out:: 09/07/2022  Location of the meeting: Bedside  Member's involved: Physician, Bedside Registered Nurse, Social Worker, and Family Member or next of kin  Durable Power of Attorney or acting medical decision maker: Douglas Ballard  Discussion: We discussed goals of care for Douglas Ballard .    The Clinical status was relayed to patient's wife and other family members at bedside in detail.   Updated and notified of patients medical condition.   Patient is having a weak cough and struggling to remove secretions.   Patient with increased WOB and using accessory muscles to breathe Evidence of kidney failure, heart failure and acute respiratory failure   Patient with Progressive multiorgan failure with a very high probablity of a very minimal chance of meaningful recovery despite all aggressive and optimal medical therapy.  Code status: Full DNR  Disposition: Continue current acute care if condition to decline from here, patient's family would like [place] to proceed with comfort care    Family are satisfied with Plan of action and management. All questions answered   [name] MD [facility] See Amion for pager If no response to pager, please call [phone] until 7pm After 7pm, Please call E-link [phone]

## 2022-09-07 NOTE — Progress Notes (Signed)
[facility] Progress Note   Assessment/ Plan:   Assessment/ Plan: AKI on CKD4 - b/l creatinine 3.7- 4.0 from sept - dec 2023, eGFR 15- 1m/min. Creat here 5.1 on admission in setting of AMS, hyponatremia, etoh w/d. Started CRRT 1/15-1/18.  Transition to IHD 1/19.  Added midodrine pre-HD, will do IV Lasix challenge as well today.  No heparin, sodium citrate locks.  Hyponatremia - resolved with CRRT Shock: off pressors DIC: per primary Hypocalcemia - aggressive replacement Etoh abuse - per pmd AMS - multifactorial due to low Na, etoh w/d, uremia HFrEF - echo showing LVEF 25-30% H/o CVA Dispo: MSOF- DNR now.     Subjective:    Seen in room.  [facility]  CXR this AM looks worse, possible aspiration.  Lots of 3rd spacing.     Objective:   BP 104/70   Pulse 83   Temp 97.9 F (36.6 C) (Axillary)   Resp (!) 24   Ht '5\' 9"'$  (1.753 m)   Wt 77.6 kg   SpO2 92%   BMI 25.26 kg/m   Intake/Output Summary (Last 24 hours) at 09/07/2022 0944 Last data filed at 09/07/2022 0800 Gross per 24 hour  Intake 1193.32 ml  Output 190 ml  Net 1003.32 ml   Weight change:   Physical Exam: GQIW:[id number] delirious CVS: tachy Resp:tachypneic, coarse breath sounds throughout Abd: + fluid wave Ext: 2+ anasarca, worsened ACCESS: R IJ nontunneled HD cath  Imaging: DG CHEST PORT 1 VIEW  Result Date: 09/07/2022 CLINICAL DATA:  Acute respiratory failure EXAM: PORTABLE CHEST 1 VIEW COMPARISON:  Chest x-ray September 03, 2022 FINDINGS: The right approach central venous catheter terminates in the low SVC. The left approach central venous catheter tip crosses midline than ascends superiorly, likely within the right brachiocephalic vein. The NG tube side port is likely within the distal esophagus/proximal stomach, with the distal tip within the stomach. Unchanged cardiomegaly and mediastinal contours. Markedly increased heterogeneous pulmonary opacities involving  bilateral lower lobes and the right upper lobe. Small left pleural effusion. No large pneumothorax. No acute osseous abnormality. The visualized upper abdomen is unremarkable. IMPRESSION: 1. Markedly increased heterogeneous pulmonary opacities involving bilateral lower lobes in the right upper lobe, concerning for multifocal infection and/or aspiration pneumonitis. 2. Small left pleural effusion. 3. Malpositioning of the left approach central venous catheter, with the tip likely within the right brachiocephalic vein. 4. The side port of the NG tube is likely within the distal esophagus. Recommend slight advancement. Electronically Signed   By: MBeryle FlockM.D.   On: 09/07/2022 09:12    Labs: BMET Recent Labs  Lab 09/03/22 1555 09/04/22 0333 09/04/22 1730 09/05/22 0309 09/05/22 1600 09/06/22 0524 09/07/22 0530 09/07/22 0558  NA 135 135 136 136 135 137  135 138 137  K 5.5* 3.4* 3.5 3.6 3.8 4.0  3.9 4.2 4.1  CL 98 100 101 103 103 104  102  --  104  CO2 9* 22 23 21* 23 19*  20*  --  23  GLUCOSE 185* 164* 152* 152* 135* 150*  150*  --  256*  BUN 78* 73* 45* 38* 33* 46*  46*  --  72*  CREATININE 5.63* 3.42* 2.23* 1.98* 1.56* 2.31*  2.29*  --  3.67*  CALCIUM 6.1* 6.5* 7.1* 7.1* 7.3* 7.7*  7.5*  --  7.7*  PHOS 8.7* 4.3 2.9 2.6 2.4* 2.5  --  3.1   CBC Recent Labs  Lab 09/03/22 1042  09/04/22 0333 09/05/22 0309 09/06/22 0524 09/06/22 1504 09/07/22 0530  WBC 13.9* 16.4* 15.6* 20.2* 21.3*  --   NEUTROABS 11.6*  --   --   --   --   --   HGB 8.0* 8.4* 8.3* 8.6* 8.3* 9.5*  HCT 23.8* 24.2* 24.3* 25.2* 24.1* 28.0*  MCV 94.8 88.6 91.4 92.3 92.0  --   PLT 61* 43*  40* 26* 14* 22*  --     Medications:     carvedilol  12.5 mg Oral BID WC   Chlorhexidine Gluconate Cloth  6 each Topical Q0600   feeding supplement (PROSource TF20)  60 mL Per Tube Daily   feeding supplement (VITAL 1.5 CAL)  1,000 mL Per Tube Q24H   insulin aspart  2-6 Units Subcutaneous Q4H   insulin glargine-yfgn   10 Units Subcutaneous Daily   ipratropium-albuterol  3 mL Nebulization TID   midodrine  10 mg Per Tube Q M,W,F-HD   multivitamin with minerals  1 tablet Per Tube Daily   mouth rinse  15 mL Mouth Rinse 4 times per day   pantoprazole (PROTONIX) IV  40 mg Intravenous Q12H   sodium chloride flush  10-40 mL Intracatheter Q12H    Madelon Lips MD 09/07/2022, 9:44 AM

## 2022-09-08 DIAGNOSIS — I4891 Unspecified atrial fibrillation: Secondary | ICD-10-CM | POA: Diagnosis not present

## 2022-09-08 DIAGNOSIS — E440 Moderate protein-calorie malnutrition: Secondary | ICD-10-CM | POA: Diagnosis not present

## 2022-09-08 DIAGNOSIS — Z515 Encounter for palliative care: Secondary | ICD-10-CM | POA: Diagnosis not present

## 2022-09-08 DIAGNOSIS — N179 Acute kidney failure, unspecified: Secondary | ICD-10-CM | POA: Diagnosis not present

## 2022-09-08 DIAGNOSIS — F10930 Alcohol use, unspecified with withdrawal, uncomplicated: Secondary | ICD-10-CM | POA: Diagnosis not present

## 2022-09-08 LAB — CBC
HCT: 24.4 % — ABNORMAL LOW (ref 39.0–52.0)
Hemoglobin: 8.4 g/dL — ABNORMAL LOW (ref 13.0–17.0)
MCH: 31.9 pg (ref 26.0–34.0)
MCHC: 34.4 g/dL (ref 30.0–36.0)
MCV: 92.8 fL (ref 80.0–100.0)
Platelets: 20 10*3/uL — CL (ref 150–400)
RBC: 2.63 MIL/uL — ABNORMAL LOW (ref 4.22–5.81)
RDW: 16.6 % — ABNORMAL HIGH (ref 11.5–15.5)
WBC: 22.8 10*3/uL — ABNORMAL HIGH (ref 4.0–10.5)
nRBC: 0.3 % — ABNORMAL HIGH (ref 0.0–0.2)

## 2022-09-08 LAB — RENAL FUNCTION PANEL
Albumin: 2 g/dL — ABNORMAL LOW (ref 3.5–5.0)
Anion gap: 9 (ref 5–15)
BUN: 35 mg/dL — ABNORMAL HIGH (ref 8–23)
CO2: 26 mmol/L (ref 22–32)
Calcium: 7.6 mg/dL — ABNORMAL LOW (ref 8.9–10.3)
Chloride: 100 mmol/L (ref 98–111)
Creatinine, Ser: 1.77 mg/dL — ABNORMAL HIGH (ref 0.61–1.24)
GFR, Estimated: 41 mL/min — ABNORMAL LOW (ref >60)
Glucose, Bld: 153 mg/dL — ABNORMAL HIGH (ref 70–99)
Phosphorus: 1.8 mg/dL — ABNORMAL LOW (ref 2.5–4.6)
Potassium: 4.1 mmol/L (ref 3.5–5.1)
Sodium: 135 mmol/L (ref 135–145)

## 2022-09-08 LAB — BASIC METABOLIC PANEL
Anion gap: 13 (ref 5–15)
BUN: 51 mg/dL — ABNORMAL HIGH (ref 8–23)
CO2: 25 mmol/L (ref 22–32)
Calcium: 7.8 mg/dL — ABNORMAL LOW (ref 8.9–10.3)
Chloride: 97 mmol/L — ABNORMAL LOW (ref 98–111)
Creatinine, Ser: 2.72 mg/dL — ABNORMAL HIGH (ref 0.61–1.24)
GFR, Estimated: 24 mL/min — ABNORMAL LOW (ref >60)
Glucose, Bld: 215 mg/dL — ABNORMAL HIGH (ref 70–99)
Potassium: 4.3 mmol/L (ref 3.5–5.1)
Sodium: 135 mmol/L (ref 135–145)

## 2022-09-08 LAB — MAGNESIUM
Magnesium: 2 mg/dL (ref 1.7–2.4)
Magnesium: 2.1 mg/dL (ref 1.7–2.4)

## 2022-09-08 LAB — CULTURE, BLOOD (ROUTINE X 2)
Culture: NO GROWTH
Culture: NO GROWTH
Special Requests: ADEQUATE
Special Requests: ADEQUATE

## 2022-09-08 LAB — C-REACTIVE PROTEIN: CRP: 13.7 mg/dL — ABNORMAL HIGH (ref <1.0)

## 2022-09-08 LAB — GLUCOSE, CAPILLARY
Glucose-Capillary: 200 mg/dL — ABNORMAL HIGH (ref 70–99)
Glucose-Capillary: 170 mg/dL — ABNORMAL HIGH (ref 70–99)
Glucose-Capillary: 138 mg/dL — ABNORMAL HIGH (ref 70–99)
Glucose-Capillary: 131 mg/dL — ABNORMAL HIGH (ref 70–99)
Glucose-Capillary: 135 mg/dL — ABNORMAL HIGH (ref 70–99)

## 2022-09-08 LAB — PHOSPHORUS
Phosphorus: 2.9 mg/dL (ref 2.5–4.6)
Phosphorus: 1.9 mg/dL — ABNORMAL LOW (ref 2.5–4.6)

## 2022-09-08 LAB — AMMONIA: Ammonia: 23 umol/L (ref 9–35)

## 2022-09-08 LAB — VITAMIN B12: Vitamin B-12: 2021 pg/mL — ABNORMAL HIGH (ref 180–914)

## 2022-09-08 MED ORDER — HEPARIN SODIUM (PORCINE) 1000 UNIT/ML DIALYSIS
1000.0000 [IU] | INTRAMUSCULAR | Status: DC | PRN
  Filled 2022-09-08: qty 6

## 2022-09-08 MED ORDER — SODIUM CHLORIDE 0.9 % IV SOLN
3.0000 g | Freq: Three times a day (TID) | INTRAVENOUS | Status: DC
  Administered 2022-09-08 – 2022-09-09 (×4): 3 g via INTRAVENOUS
  Filled 2022-09-08 (×4): qty 8

## 2022-09-08 MED ORDER — PRISMASOL BGK 4/2.5 32-4-2.5 MEQ/L REPLACEMENT SOLN: Status: DC

## 2022-09-08 MED ORDER — "THROMBI-PAD 3""X3"" EX PADS"
1.0000 | MEDICATED_PAD | Freq: Once | CUTANEOUS | Status: AC
  Administered 2022-09-08: 1 via TOPICAL
  Filled 2022-09-08: qty 1

## 2022-09-08 MED ORDER — CARVEDILOL 25 MG PO TABS
25.0000 mg | ORAL_TABLET | Freq: Two times a day (BID) | ORAL | Status: DC
  Administered 2022-09-08 – 2022-09-09 (×2): 25 mg
  Filled 2022-09-08 (×2): qty 1

## 2022-09-08 MED ORDER — PRISMASOL BGK 4/2.5 32-4-2.5 MEQ/L EC SOLN: Status: DC

## 2022-09-08 MED ORDER — POTASSIUM PHOSPHATES 15 MMOLE/5ML IV SOLN: 30.0000 mmol | Freq: Once | INTRAVENOUS | Status: DC

## 2022-09-08 MED ORDER — SODIUM PHOSPHATES 45 MMOLE/15ML IV SOLN
30.0000 mmol | Freq: Once | INTRAVENOUS | Status: AC
  Administered 2022-09-08: 30 mmol via INTRAVENOUS
  Filled 2022-09-08: qty 10

## 2022-09-08 MED ORDER — CHLORHEXIDINE GLUCONATE CLOTH 2 % EX PADS
6.0000 | MEDICATED_PAD | Freq: Every day | CUTANEOUS | Status: DC
  Administered 2022-09-08 – 2022-09-09 (×3): 6 via TOPICAL

## 2022-09-08 MED ORDER — INSULIN GLARGINE-YFGN 100 UNIT/ML ~~LOC~~ SOLN
12.0000 [IU] | Freq: Every day | SUBCUTANEOUS | Status: DC
  Administered 2022-09-09: 12 [IU] via SUBCUTANEOUS
  Filled 2022-09-08: qty 0.12

## 2022-09-08 NOTE — Progress Notes (Signed)
NAME:  Douglas Ballard, MRN:  [id number], DOB:  1951-01-12, LOS: 9 ADMISSION DATE:  08/24/2022, CONSULTATION DATE:  09/13/2022 REFERRING MD:  Dr. Francia Greaves, CHIEF COMPLAINT:  fall   History of Present Illness:   72 year old male with prior hx as below, significant for Afib on Xarelto, HFpEF, CKD IV, and DMT2 presented from home by EMS after unwitnessed fall.  Patient reported to EMS, fell out of bed due to weakness while trying to go the bathroom, denied LOC, and sustained a small laceration to the left temporal area.  Additionally complained of nausea and vomiting for one month in which he has not had evaluated.  Was alert and oriented initially and denied any neck or back pain.   In ER, patient found in afib with RVR, no temperature available yet, normoxia, and SBP 130-150's.  While in ER patient c/o of neck, back, and ankle pain then became more agitated, altered and tremulous with concern for ETOH withdrawal.  Given ativan '1mg'$  twice.  Additionally found to be in Afib with RVR, placed on cardizem gtt. Labs noted for Na 113, Cl 79, sCr 4.5, BUN/ sCr 64/ 5.11 (prior 54/ 3.26 04/2021), bicarb 15, AG 19, glucose 179, AST/ ALT 712/ 553, t. Bili 2.1, BNP 2970, trop hs 36, WBC 19.8, Hgb 10.3, Hct 28, plts 185, neg ETOH level, EKG afib with incomplete RBBB, rate 125, Qtc 579.  CTH and cervical neg for acute fx or intracranial process. CXR showed cardiomegaly with mild pulmonary venous congestion and possible early right perihilar opacity.  XR left/ right ankle neg, lumbar and pelvis neg for fx.  He has received 1L LR in ER.  PCCM called for admit.  Patient remains less talkative after ativan, remains tremulous, and confused.   Pertinent  Medical History  Former smoker, Afib on Xarelto, ETOH use, HTN, HFpEF, CKD IV, DMT2, anemia of chronic disease, colovesical fistula  Significant Hospital Events: Including procedures, antibiotic start and stop dates in addition to other pertinent events   1/11 admitted  1/12  sodium level improved on 3% saline, bicarb drip started 1/13 3% saline restarted 1/15 developed septic shock, DIC, started on CRRT. HD cath and CVL placed. 1/16 off Levophed  Interim History / Subjective:  Patient continued to remain on high flow nasal cannula oxygen at 95% and 60 L also he is on nonrebreather mask due to desaturation  Remained afebrile Still in A-fib with controlled rate  Objective   Blood pressure 115/60, pulse (!) 101, temperature 99.6 F (37.6 C), temperature source Axillary, resp. rate 19, height '5\' 9"'$  (1.753 m), weight 76.3 kg, SpO2 92 %.    FiO2 (%):  [100 %] 100 %   Intake/Output Summary (Last 24 hours) at 09/08/2022 1354 Last data filed at 09/08/2022 1300 Gross per 24 hour  Intake 1131.77 ml  Output 3600 ml  Net -2468.23 ml   Filed Weights   09/07/22 0900 09/07/22 1525 09/08/22 0500  Weight: 79.6 kg 79.6 kg 76.3 kg   Examination:   Physical exam: General: Crtitically ill-appearing male, on high flow nasal cannula oxygen and nonrebreather facemask HEENT: [place]/AT, eyes anicteric.  Moist mucous membranes Neuro: Lethargic, opens eyes with vocal stimuli, confused, intermittent following commands Chest: Bilateral basal crackles, faint disease Heart: Irregularly irregular, no murmurs or gallops Abdomen: Soft, nondistended, bowel sounds present Skin: No rash  Labs: Sodium 135 Potassium 4.3 BUN 51 Creatinine 2.72 WBC 22.8 H/H 8.4/24.4 Platelet 20  Resolved Hospital Problem list   Hypomagnesemia Hypernatremia Anion Gap  Metabolic Acidosis lactic acidosis, Septic shock  Assessment & Plan:  Severe sepsis due to aspiration pneumonia Patient remained off vasopressors One of the 2 blood culture bottle positive for staph epi, likely contaminant Repeat blood cultures are negative Continue IV Unasyn He is n.p.o., on tube feeds  Acute toxic/septic encephalopathy ETOH withdrawal Uremia Patient is done with alcohol withdrawal as he was admitted more  than a week ago Continue high-dose thiamine Continue CIWA protocol He remained can fused BUN has trended down after dialysis  Acute Hypoxemic Respiratory Failure Aspiration PNA Small bilateral Pleural Effusions Patient is on high flow nasal cannula oxygen at 100% FiO2 and 60 L and nonrebreather Titrate oxygen with O2 sat goal 92% Continue IV Unasyn Follow-up respiratory culture  AKI on CKD stage IV, baseline creatinine 4.0 December 2023 Hypocalcemia Patient is dialysis dependent now Received hemodialysis yesterday He remained anuric Restarted back on CRRT per discussion with nephrology  Acute Systolic Heart Failure, 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 Paroxysmal Afib with RVR HTN Patient's echocardiogram showed EF 20 to 25% Due to multiple organ dysfunction, cardiology recommended conservative management Remain in A-fib, now rate is controlled, close to 100 Increase Coreg to 25 mg twice daily Started on Coreg 12.5 mg twice daily Does not make much urine so diuretic will not help Receiving CT further volume removal He has severe thrombocytopenia, unable to anticoagulate him for stroke prophylaxis Blood pressure is controlled  DIC was ruled out with a normal fibrinogen Coagulopathy of liver disease Severe thrombocytopenia in the setting of liver disease and bone marrow suppression from sepsis Acute blood loss anemia on anemia of chronic disease illness Patient is s/p 2 unit PRBCs, cryo, platelet and FFP I do not think he has DIC as fibrinogen is low normal to normal Platelet counts are severely low but is stable, today it is 20 Monitor H&H, platelet and coags  Prolonged QTC Continue telemetry monitoring Monitor electrolytes  DM type II Patient's hemoglobin A1c is 6.8 Blood sugars are controlled Continue Semglee 12 units daily Continue sliding scale insulin with CBG goal 140-180  Left temporal scalp laceration s/p staple 2/2 fall  Staple removal in 7-10 days   Severe  protein calorie malnutrition Cortrak was placed Continue dietary supplements  Best Practice (right click and "Reselect all SmartList Selections" daily)   Diet/type: tubefeeds and NPO DVT prophylaxis: not indicated GI prophylaxis: PPI Lines: HD catheter Foley: Discontinue Code Status: DNR Last date of multidisciplinary goals of care discussion: 1/19, please see Ipal note.  Palliative care is following  Labs   CBC: Recent Labs  Lab 09/03/22 1042 09/04/22 0333 09/05/22 0309 09/06/22 0524 09/06/22 1504 09/07/22 0530 09/07/22 1517 09/08/22 0438  WBC 13.9*   < > 15.6* 20.2* 21.3*  --  19.3* 22.8*  NEUTROABS 11.6*  --   --   --   --   --   --   --   HGB 8.0*   < > 8.3* 8.6* 8.3* 9.5* 8.3* 8.4*  HCT 23.8*   < > 24.3* 25.2* 24.1* 28.0* 24.2* 24.4*  MCV 94.8   < > 91.4 92.3 92.0  --  93.1 92.8  PLT 61*   < > 26* 14* 22*  --  16* 20*   < > = values in this interval not displayed.    Basic Metabolic Panel: Recent Labs  Lab 09/05/22 0309 09/05/22 1600 09/06/22 0524 09/07/22 0530 09/07/22 0558 09/07/22 0559 09/07/22 1517 09/08/22 0438  NA 136 135 137  135 138 137  --   --  135  K 3.6 3.8 4.0  3.9 4.2 4.1  --   --  4.3  CL 103 103 104  102  --  104  --   --  97*  CO2 21* 23 19*  20*  --  23  --   --  25  GLUCOSE 152* 135* 150*  150*  --  256*  --   --  215*  BUN 38* 33* 46*  46*  --  72*  --   --  51*  CREATININE 1.98* 1.56* 2.31*  2.29*  --  3.67*  --   --  2.72*  CALCIUM 7.1* 7.3* 7.7*  7.5*  --  7.7*  --   --  7.8*  MG 2.3  --  2.3  --   --  2.4 2.5* 2.0  PHOS 2.6 2.4* 2.5  --  3.1  --  3.2 2.9   GFR: Estimated Creatinine Clearance: 24.9 mL/min (A) (by C-G formula based on SCr of 2.72 mg/dL (H)). Recent Labs  Lab 09/03/22 1037 09/03/22 1042 09/03/22 1555 09/03/22 1839 09/03/22 2255 09/04/22 0333 09/05/22 0309 09/06/22 0524 09/06/22 1504 09/07/22 1517 09/08/22 0438  PROCALCITON  --   --  0.90  --   --   --   --   --   --   --   --   WBC  --    < >   --   --   --  16.4*   < > 20.2* 21.3* 19.3* 22.8*  LATICACIDVEN >9.0*  --   --  5.7* 3.9* 2.1*  --   --   --   --   --    < > = values in this interval not displayed.    Liver Function Tests: Recent Labs  Lab 09/01/22 2345 09/02/22 0841 09/03/22 1042 09/03/22 1555 09/04/22 0333 09/04/22 1730 09/05/22 0309 09/05/22 0603 09/05/22 1600 09/06/22 0524 09/07/22 0558  AST 718*  --  2,030*  --  859*  --   --  354*  --  157*  --   ALT 914*  --  1,576*  --  1,027*  --   --  669*  --  480*  --   ALKPHOS 128*  --  113  --  93  --   --  94  --  97  --   BILITOT 2.4*  --  3.4*  --  4.0*  --   --  4.8*  --  7.5*  --   PROT 6.4*  --  5.5*  --  4.7*  --   --  4.5*  --  5.3*  --   ALBUMIN 2.9*   < > 2.6*   < > 2.4*  2.3*   < > 2.3* 2.2* 2.4* 2.4*  2.3* 2.0*   < > = values in this interval not displayed.   Recent Labs  Lab 09/03/22 1042  LIPASE 81*   Recent Labs  Lab 09/03/22 1555 09/07/22 2358  AMMONIA 89* 23    ABG    Component Value Date/Time   PHART 7.384 09/07/2022 0530   PCO2ART 34.4 09/07/2022 0530   PO2ART 65 (L) 09/07/2022 0530   HCO3 20.6 09/07/2022 0530   TCO2 22 09/07/2022 0530   ACIDBASEDEF 4.0 (H) 09/07/2022 0530   O2SAT 93 09/07/2022 0530     Coagulation Profile: Recent Labs  Lab 09/03/22 0904 09/03/22 1042 09/04/22 0333 09/06/22 0524  INR 6.8* 6.8* 4.0*  3.9* 2.7*  Cardiac Enzymes: No results for input(s): "CKTOTAL", "CKMB", "CKMBINDEX", "TROPONINI" in the last 168 hours.  HbA1C: Hgb A1c MFr Bld  Date/Time Value Ref Range Status  05/16/2021 11:47 AM 6.8 (H) 4.6 - 6.5 % Final    Comment:    Glycemic Control Guidelines for People with Diabetes:Non Diabetic:  <6%Goal of Therapy: <7%Additional Action Suggested:  >8%   03/14/2020 10:16 AM 5.9 (H) 4.8 - 5.6 % Final    Comment:    (NOTE) Pre diabetes:          5.7%-6.4%  Diabetes:              >6.4%  Glycemic control for   <7.0% adults with diabetes     CBG: Recent Labs  Lab  09/07/22 1621 09/07/22 1946 09/07/22 2337 09/08/22 0407 09/08/22 0848  GLUCAP 162* 118* 203* 200* 170*   This patient is critically ill with multiple organ system failure which requires frequent high complexity decision making, assessment, support, evaluation, and titration of therapies. This was completed through the application of advanced monitoring technologies and extensive interpretation of multiple databases.  During this encounter critical care time was devoted to patient care services described in this note for 38 minutes.    [name], MD [facility] See Amion for pager If no response to pager, please call [phone] until 7pm After 7pm, Please call E-link [phone]

## 2022-09-08 NOTE — Progress Notes (Signed)
Left forehead staple x1 d/c'd without difficulty.  Edges remain approximated without redness or edema.

## 2022-09-08 NOTE — Progress Notes (Addendum)
Palliative Medicine Inpatient Follow Up Note   HPI: Patient is a 72 year old male with a past medical history of A-fib on Xarelto, CHF, CKD stage IV, diabetes mellitus type 2, alcoholism, and prior tobacco use who was admitted on 09/18/2022 for management after unwitnessed fall. Patient found to be altered upon admission. During hospitalization, patient required transfer to ICU for management of septic shock due to pneumonia requiring pressor support as well as AKI on CKD requiring CRRT. Patient also had repeat echo which is showing EF now 25-25%. Nephrology and cardiology following along with patient's course. Medicine team consulted to assist with complex medical decision making.    Today's Discussion 09/08/2022  *Please note that this is a verbal dictation therefore any spelling or grammatical errors are due to the "[employer]" system interpretation.  Chart reviewed inclusive of vital signs, progress notes, laboratory results, and diagnostic images.   I met with Douglas Ballard and his brother this morning. He was encephalopathic and unable to follow commands. He is now on CRRT and maximized on HFNC with a NRB FM on. His WBC is up as well despite antibiosis.   I asked to meet with patients wife, daughter, brother, SIL, and niece.   A formal review of patients hospital stay and family understanding of his condition was held.   Discussed ongoing concern in the setting of Douglas Ballard and his continued decline despite aggressive measures. I shared the concerns of myself and the medical team if he neglects to improve. I shared if he should continue to decline it is important to consider what the next steps would be. We discussed the option of continuing present measures versus a transition to comfort.  Patients spouse at this time remains hopeful that with CRRT for his fluid accumulation and antibiotics for his PNA that he will improve.   I shared that it is worth considering what to do if he  does not. She is very tearful but understands that he is critically ill and may not rebound from this complicated series of illness'.   Questions and concerns addressed/Palliative Support Provided.   Objective Assessment: Vital Signs Vitals:   09/08/22 0900 09/08/22 1000  BP: 127/81 124/67  Pulse: (!) 104 (!) 101  Resp: 18 17  Temp:    SpO2: 94% 93%    Intake/Output Summary (Last 24 hours) at 09/08/2022 1130 Last data filed at 09/08/2022 1100 Gross per 24 hour  Intake 1021.74 ml  Output 3209 ml  Net -2187.26 ml    Last Weight  Most recent update: 09/08/2022  6:07 AM    Weight  76.3 kg (168 lb 3.4 oz)            Gen:  Frail elderly caucasian M chronically ill appearing HEENT: [place], Dry mucous membranes CV: Regular rate and rhythm  PULM:  On 50LPM HFNC and NRB FM ABD: soft/nontender  EXT: BUE edema Neuro: Opens eyes and responds though is disoriented  SUMMARY OF RECOMMENDATIONS   DNAR/DNI  Continue current care - allowing time for outcomes  Open and honest conversations held in the setting of patients acute illness and possibility of further decline  Discussed the idea of comfort measures if patient worsens  PMT will continue to follow  Total Time: 72 Billing based on MDM: High ______________________________________________________________________________________ [place] Team Team Cell Phone: [phone] Please utilize secure chat with additional questions, if there is no response within 30 minutes please call the above phone number  Palliative Medicine  Team providers are available by phone from 7am to 7pm daily and can be reached through the team cell phone.  Should this patient require assistance outside of these hours, please call the patient's attending physician.

## 2022-09-08 NOTE — Progress Notes (Signed)
RT NOTE: RT decreased HHFNC FiO2 to 94% per HHFNC alarming. Per manufacturer [place] will alarm if FiO2 is set to 95% and above. Patient also on NRB at this time. RT will continue to monitor.

## 2022-09-08 NOTE — Progress Notes (Signed)
[facility] Progress Note   Assessment/ Plan:   Assessment/ Plan: AKI on CKD4 - b/l creatinine 3.7- 4.0 from sept - dec 2023, eGFR 15- 53m/min. Creat here 5.1 on admission in setting of AMS, hyponatremia, etoh w/d. Started CRRT 1/15-1/18.  Transition to IHD 1/19--> with decline in respiratory status and mentation despite IHD- switch back to CRRT 1/20.   Acute hypoxic RF: volume + possible aspiration, on unasyn Hyponatremia - resolved  Shock: off pressors DIC: per primary, platelets persistently low Hypocalcemia - aggressive replacement Etoh abuse - per pmd AMS - multifactorial due to low Na, etoh w/d, uremia HFrEF - echo showing LVEF 25-30% H/o CVA Dispo: MSOF- DNR now.     Subjective:    HD yesterday- got over 2L off but respiratory status and mentation declined overnight- placed back on CRRT at around 3:30-4 am.  Mumbling this AM.     Objective:   BP 112/83   Pulse (!) 110   Temp 99.6 F (37.6 C) (Axillary)   Resp (!) 23   Ht '5\' 9"'$  (1.753 m)   Wt 76.3 kg   SpO2 93%   BMI 24.84 kg/m   Intake/Output Summary (Last 24 hours) at 09/08/2022 0831 Last data filed at 09/08/2022 0800 Gross per 24 hour  Intake 671.74 ml  Output 2975 ml  Net -2303.26 ml   Weight change:   Physical Exam: [id number] delirious CVS: tachy Resp:tachypneic, coarse breath sounds throughout Abd: + fluid wave Ext: 2+ anasarca,  ACCESS: R IJ nontunneled HD cath  Imaging: DG Abd Portable 1V  Result Date: 09/07/2022 CLINICAL DATA:  Feeding tube placement EXAM: PORTABLE ABDOMEN - 1 VIEW COMPARISON:  09/03/2022 FINDINGS: Feeding tube terminates at the distal gastric body to antrum. Incompletely imaged central line. Bibasilar airspace disease with small left pleural effusion. No gross free intraperitoneal air. IMPRESSION: Feeding tube terminating at the distal body to antrum. Electronically Signed   By: KAbigail MiyamotoM.D.   On: 09/07/2022 11:44   DG CHEST PORT 1 VIEW  Result Date:  09/07/2022 CLINICAL DATA:  Acute respiratory failure EXAM: PORTABLE CHEST 1 VIEW COMPARISON:  Chest x-ray September 03, 2022 FINDINGS: The right approach central venous catheter terminates in the low SVC. The left approach central venous catheter tip crosses midline than ascends superiorly, likely within the right brachiocephalic vein. The NG tube side port is likely within the distal esophagus/proximal stomach, with the distal tip within the stomach. Unchanged cardiomegaly and mediastinal contours. Markedly increased heterogeneous pulmonary opacities involving bilateral lower lobes and the right upper lobe. Small left pleural effusion. No large pneumothorax. No acute osseous abnormality. The visualized upper abdomen is unremarkable. IMPRESSION: 1. Markedly increased heterogeneous pulmonary opacities involving bilateral lower lobes in the right upper lobe, concerning for multifocal infection and/or aspiration pneumonitis. 2. Small left pleural effusion. 3. Malpositioning of the left approach central venous catheter, with the tip likely within the right brachiocephalic vein. 4. The side port of the NG tube is likely within the distal esophagus. Recommend slight advancement. Electronically Signed   By: MBeryle FlockM.D.   On: 09/07/2022 09:12    Labs: BMET Recent Labs  Lab 09/04/22 0333 09/04/22 1730 09/05/22 0309 09/05/22 1600 09/06/22 0524 09/07/22 0530 09/07/22 0558 09/07/22 1517 09/08/22 0438  NA 135 136 136 135 137  135 138 137  --  135  K 3.4* 3.5 3.6 3.8 4.0  3.9 4.2 4.1  --  4.3  CL 100 101 103 103 104  102  --  104  --  97*  CO2 22 23 21* 23 19*  20*  --  23  --  25  GLUCOSE 164* 152* 152* 135* 150*  150*  --  256*  --  215*  BUN 73* 45* 38* 33* 46*  46*  --  72*  --  51*  CREATININE 3.42* 2.23* 1.98* 1.56* 2.31*  2.29*  --  3.67*  --  2.72*  CALCIUM 6.5* 7.1* 7.1* 7.3* 7.7*  7.5*  --  7.7*  --  7.8*  PHOS 4.3 2.9 2.6 2.4* 2.5  --  3.1 3.2 2.9   CBC Recent Labs  Lab  09/03/22 1042 09/04/22 0333 09/06/22 0524 09/06/22 1504 09/07/22 0530 09/07/22 1517 09/08/22 0438  WBC 13.9*   < > 20.2* 21.3*  --  19.3* 22.8*  NEUTROABS 11.6*  --   --   --   --   --   --   HGB 8.0*   < > 8.6* 8.3* 9.5* 8.3* 8.4*  HCT 23.8*   < > 25.2* 24.1* 28.0* 24.2* 24.4*  MCV 94.8   < > 92.3 92.0  --  93.1 92.8  PLT 61*   < > 14* 22*  --  16* 20*   < > = values in this interval not displayed.    Medications:     carvedilol  12.5 mg Per Tube BID WC   Chlorhexidine Gluconate Cloth  6 each Topical Daily   feeding supplement (PROSource TF20)  60 mL Per Tube Daily   feeding supplement (VITAL 1.5 CAL)  1,000 mL Per Tube V61Y   folic acid  1 mg Per Tube Daily   insulin aspart  2-6 Units Subcutaneous Q4H   insulin glargine-yfgn  10 Units Subcutaneous Daily   ipratropium-albuterol  3 mL Nebulization TID   midodrine  10 mg Per Tube Q M,W,F-HD   multivitamin  1 tablet Per Tube QHS   mouth rinse  15 mL Mouth Rinse 4 times per day   pantoprazole (PROTONIX) IV  40 mg Intravenous Q12H   sodium chloride flush  10-40 mL Intracatheter Q12H    Madelon Lips MD 09/08/2022, 8:31 AM

## 2022-09-08 NOTE — Progress Notes (Signed)
Phosphorous 1.8, Calcium 7.6, potassium 4.1 called to Dr Arty Baumgartner.  Orders received.

## 2022-09-08 NOTE — Progress Notes (Addendum)
PHARMACY NOTE:  ANTIMICROBIAL RENAL DOSAGE ADJUSTMENT  Current antimicrobial regimen includes a mismatch between antimicrobial dosage and estimated renal function.  As per policy approved by the Pharmacy & Therapeutics and Medical Executive Committees, the antimicrobial dosage will be adjusted accordingly.  Current antimicrobial dosage:  Unasyn 3g IV Q12H  Indication: aspiration PNA  Renal Function:  Estimated Creatinine Clearance: 18.5 mL/min (A) (by C-G formula based on SCr of 3.67 mg/dL (H)). '[]'$      On intermittent HD, scheduled: '[x]'$      On CRRT (new start)    Antimicrobial dosage has been changed to:  Unasyn 3g IV Q8H   Thank you for allowing pharmacy to be a part of this patient's care.  Wynona Neat, PharmD, BCPS  09/08/2022 4:52 AM

## 2022-09-09 DIAGNOSIS — I4891 Unspecified atrial fibrillation: Secondary | ICD-10-CM | POA: Diagnosis not present

## 2022-09-09 DIAGNOSIS — E871 Hypo-osmolality and hyponatremia: Secondary | ICD-10-CM | POA: Diagnosis not present

## 2022-09-09 DIAGNOSIS — N179 Acute kidney failure, unspecified: Secondary | ICD-10-CM | POA: Diagnosis not present

## 2022-09-09 DIAGNOSIS — W19XXXA Unspecified fall, initial encounter: Secondary | ICD-10-CM

## 2022-09-09 DIAGNOSIS — Z515 Encounter for palliative care: Secondary | ICD-10-CM | POA: Diagnosis not present

## 2022-09-09 DIAGNOSIS — F10930 Alcohol use, unspecified with withdrawal, uncomplicated: Secondary | ICD-10-CM | POA: Diagnosis not present

## 2022-09-09 LAB — HEPATIC FUNCTION PANEL
ALT: 160 U/L — ABNORMAL HIGH (ref 0–44)
AST: 33 U/L (ref 15–41)
Albumin: 1.9 g/dL — ABNORMAL LOW (ref 3.5–5.0)
Alkaline Phosphatase: 91 U/L (ref 38–126)
Bilirubin, Direct: 5.1 mg/dL — ABNORMAL HIGH (ref 0.0–0.2)
Indirect Bilirubin: 3.5 mg/dL — ABNORMAL HIGH (ref 0.3–0.9)
Total Bilirubin: 8.6 mg/dL — ABNORMAL HIGH (ref 0.3–1.2)
Total Protein: 5.3 g/dL — ABNORMAL LOW (ref 6.5–8.1)

## 2022-09-09 LAB — RENAL FUNCTION PANEL
Albumin: 1.9 g/dL — ABNORMAL LOW (ref 3.5–5.0)
Anion gap: 12 (ref 5–15)
BUN: 31 mg/dL — ABNORMAL HIGH (ref 8–23)
CO2: 22 mmol/L (ref 22–32)
Calcium: 7.7 mg/dL — ABNORMAL LOW (ref 8.9–10.3)
Chloride: 100 mmol/L (ref 98–111)
Creatinine, Ser: 1.44 mg/dL — ABNORMAL HIGH (ref 0.61–1.24)
GFR, Estimated: 52 mL/min — ABNORMAL LOW
Glucose, Bld: 167 mg/dL — ABNORMAL HIGH (ref 70–99)
Phosphorus: 3.3 mg/dL (ref 2.5–4.6)
Potassium: 4.5 mmol/L (ref 3.5–5.1)
Sodium: 134 mmol/L — ABNORMAL LOW (ref 135–145)

## 2022-09-09 LAB — CBC
HCT: 25.1 % — ABNORMAL LOW (ref 39.0–52.0)
Hemoglobin: 8.7 g/dL — ABNORMAL LOW (ref 13.0–17.0)
MCH: 31.9 pg (ref 26.0–34.0)
MCHC: 34.7 g/dL (ref 30.0–36.0)
MCV: 91.9 fL (ref 80.0–100.0)
Platelets: 23 10*3/uL — CL (ref 150–400)
RBC: 2.73 MIL/uL — ABNORMAL LOW (ref 4.22–5.81)
RDW: 18.2 % — ABNORMAL HIGH (ref 11.5–15.5)
WBC: 19.2 10*3/uL — ABNORMAL HIGH (ref 4.0–10.5)
nRBC: 0.2 % (ref 0.0–0.2)

## 2022-09-09 LAB — GLUCOSE, CAPILLARY
Glucose-Capillary: 181 mg/dL — ABNORMAL HIGH (ref 70–99)
Glucose-Capillary: 161 mg/dL — ABNORMAL HIGH (ref 70–99)
Glucose-Capillary: 156 mg/dL — ABNORMAL HIGH (ref 70–99)
Glucose-Capillary: 123 mg/dL — ABNORMAL HIGH (ref 70–99)

## 2022-09-09 LAB — MAGNESIUM: Magnesium: 2.1 mg/dL (ref 1.7–2.4)

## 2022-09-09 MED ORDER — BIOTENE DRY MOUTH MT LIQD: 15.0000 mL | OROMUCOSAL | Status: DC | PRN

## 2022-09-09 MED ORDER — ONDANSETRON HCL 4 MG/2ML IJ SOLN: 4.0000 mg | Freq: Four times a day (QID) | INTRAMUSCULAR | Status: DC | PRN

## 2022-09-09 MED ORDER — HYDROMORPHONE HCL-NACL 50-0.9 MG/50ML-% IV SOLN
2.0000 mg/h | INTRAVENOUS | Status: DC
  Administered 2022-09-09: 2 mg/h via INTRAVENOUS
  Filled 2022-09-09: qty 50

## 2022-09-09 MED ORDER — HYDROMORPHONE BOLUS VIA INFUSION: 1.0000 mg | INTRAVENOUS | Status: DC | PRN

## 2022-09-09 MED ORDER — ONDANSETRON 4 MG PO TBDP: 4.0000 mg | ORAL_TABLET | Freq: Four times a day (QID) | ORAL | Status: DC | PRN

## 2022-09-09 MED ORDER — POLYVINYL ALCOHOL 1.4 % OP SOLN: 1.0000 [drp] | Freq: Four times a day (QID) | OPHTHALMIC | Status: DC | PRN

## 2022-09-09 MED ORDER — GLYCOPYRROLATE 0.2 MG/ML IJ SOLN
0.4000 mg | INTRAMUSCULAR | Status: DC
  Administered 2022-09-09: 0.4 mg via INTRAVENOUS
  Filled 2022-09-09: qty 2

## 2022-09-09 MED ORDER — ACETAMINOPHEN 325 MG PO TABS: 650.0000 mg | ORAL_TABLET | Freq: Four times a day (QID) | ORAL | Status: DC | PRN

## 2022-09-09 MED ORDER — MIDAZOLAM-SODIUM CHLORIDE 100-0.9 MG/100ML-% IV SOLN
1.0000 mg/h | INTRAVENOUS | Status: DC
  Administered 2022-09-09: 2 mg/h via INTRAVENOUS
  Filled 2022-09-09: qty 100

## 2022-09-09 MED ORDER — MIDAZOLAM BOLUS VIA INFUSION: 1.0000 mg | INTRAVENOUS | Status: DC | PRN

## 2022-09-09 MED ORDER — ACETAMINOPHEN 650 MG RE SUPP: 650.0000 mg | Freq: Four times a day (QID) | RECTAL | Status: DC | PRN

## 2022-09-10 LAB — VITAMIN E
Vitamin E (Alpha Tocopherol): 3.8 mg/L — ABNORMAL LOW (ref 9.0–29.0)
Vitamin E(Gamma Tocopherol): 0.1 mg/L — ABNORMAL LOW (ref 0.5–4.9)

## 2022-09-10 LAB — VITAMIN A: Vitamin A (Retinoic Acid): 14.1 ug/dL — ABNORMAL LOW (ref 22.0–69.5)

## 2022-09-11 LAB — ZINC: Zinc: 48 ug/dL (ref 44–115)

## 2022-09-11 LAB — COPPER, SERUM: Copper: 73 ug/dL (ref 69–132)

## 2022-09-12 LAB — VITAMIN B6: Vitamin B6: 3.1 ug/L — ABNORMAL LOW (ref 3.4–65.2)

## 2022-09-20 NOTE — Progress Notes (Signed)
NAME:  Douglas Ballard, MRN:  [id number], DOB:  July 01, 1951, LOS: 58 ADMISSION DATE:  09/11/2022, CONSULTATION DATE:  09/19/2022 REFERRING MD:  Dr. Francia Greaves, CHIEF COMPLAINT:  fall   History of Present Illness:   72 year old male with prior hx as below, significant for Afib on Xarelto, HFpEF, CKD IV, and DMT2 presented from home by EMS after unwitnessed fall.  Patient reported to EMS, fell out of bed due to weakness while trying to go the bathroom, denied LOC, and sustained a small laceration to the left temporal area.  Additionally complained of nausea and vomiting for one month in which he has not had evaluated.  Was alert and oriented initially and denied any neck or back pain.   In ER, patient found in afib with RVR, no temperature available yet, normoxia, and SBP 130-150's.  While in ER patient c/o of neck, back, and ankle pain then became more agitated, altered and tremulous with concern for ETOH withdrawal.  Given ativan '1mg'$  twice.  Additionally found to be in Afib with RVR, placed on cardizem gtt. Labs noted for Na 113, Cl 79, sCr 4.5, BUN/ sCr 64/ 5.11 (prior 54/ 3.26 04/2021), bicarb 15, AG 19, glucose 179, AST/ ALT 712/ 553, t. Bili 2.1, BNP 2970, trop hs 36, WBC 19.8, Hgb 10.3, Hct 28, plts 185, neg ETOH level, EKG afib with incomplete RBBB, rate 125, Qtc 579.  CTH and cervical neg for acute fx or intracranial process. CXR showed cardiomegaly with mild pulmonary venous congestion and possible early right perihilar opacity.  XR left/ right ankle neg, lumbar and pelvis neg for fx.  He has received 1L LR in ER.  PCCM called for admit.  Patient remains less talkative after ativan, remains tremulous, and confused.   Pertinent  Medical History  Former smoker, Afib on Xarelto, ETOH use, HTN, HFpEF, CKD IV, DMT2, anemia of chronic disease, colovesical fistula  Significant Hospital Events: Including procedures, antibiotic start and stop dates in addition to other pertinent events   1/11 admitted   1/12 sodium level improved on 3% saline, bicarb drip started 1/13 3% saline restarted 1/15 developed septic shock, DIC, started on CRRT. HD cath and CVL placed. 1/16 off Levophed  Interim History / Subjective:  Patient remains on CRRT Still requiring high flow nasal cannula oxygen with nonrebreather Remain confused   Objective   Blood pressure 96/61, pulse 97, temperature (!) 96.8 F (36 C), temperature source Axillary, resp. rate (!) 28, height '5\' 9"'$  (1.753 m), weight 74.9 kg, SpO2 98 %.    FiO2 (%):  [100 %] 100 %   Intake/Output Summary (Last 24 hours) at October 09, 2022 0813 Last data filed at 10-09-22 0700 Gross per 24 hour  Intake 1280.13 ml  Output 4092 ml  Net -2811.87 ml   Filed Weights   09/07/22 1525 09/08/22 0500 10-09-22 0442  Weight: 79.6 kg 76.3 kg 74.9 kg   Examination: Physical exam: General: Critically ill-appearing male, lying on the bed, on high flow nasal cannula oxygen and nonrebreather HEENT: [place]/AT, eyes anicteric.  moist mucus membranes Neuro: Awake, confused, intermittently following few commands Chest: Bilateral basal crackles right more than left,, no wheezes or rhonchi Heart: Irregularly irregular, no murmurs or gallops Abdomen: Soft, nontender, nondistended, bowel sounds present Skin: No rash   Labs: Sodium 134 Potassium 4.5 BUN 31 Creatinine 1.44 WBC 19.2 H/H 8.7/25 Platelet 23  Resolved Hospital Problem list   Hypomagnesemia Hypernatremia Anion Gap Metabolic Acidosis lactic acidosis, Septic shock  Assessment & Plan:  Severe sepsis due to aspiration pneumonia One of the 2 blood culture bottle positive for staph epi, likely contaminant Repeat blood cultures are negative Continue IV Unasyn to complete 5 days therapy He is n.p.o., on tube feeds, continue tube feeds  Acute toxic/septic encephalopathy ETOH withdrawal Uremia Patient is done with alcohol withdrawal as he was admitted more than a week ago Continue high-dose  thiamine Continue CIWA protocol He is confused and encephalopathic On CRRT with improvement in BUN  Acute Hypoxemic Respiratory Failure Aspiration PNA Small bilateral Pleural Effusions Patient is on high flow nasal cannula oxygen at 100% FiO2 and 60 L and nonrebreather, unable to titrate oxygen Continue IV Unasyn Follow-up respiratory culture  AKI on CKD stage IV, baseline creatinine 4.0 December 2023 Hypocalcemia Patient is dialysis dependent now Has been on CRRT since yesterday He remained anuric Nephrology is following  Acute Systolic Heart Failure, 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 Paroxysmal Afib with RVR HTN Patient's echocardiogram showed EF 20 to 25% Due to multiple organ dysfunction, cardiology recommended conservative management Remain in A-fib, now rate is controlled 90 - 100 Tolerating Coreg to 25 mg twice daily Does not make much urine so diuretic will not help Continue CRRT for volume removal He has severe thrombocytopenia, unable to anticoagulate him for stroke prophylaxis Blood pressure is controlled  DIC was ruled out with a normal fibrinogen Coagulopathy of liver disease Severe thrombocytopenia in the setting of liver disease and bone marrow suppression from sepsis Acute blood loss anemia on anemia of chronic disease illness Patient is s/p 2 unit PRBCs, cryo, platelet and FFP Platelet counts are severely low but is stable, today it is 23 Monitor H&H, platelet and coags  Prolonged QTC Continue telemetry monitoring Monitor electrolytes  DM type II Patient's hemoglobin A1c is 6.8 Blood sugars are controlled Continue Semglee 12 units daily Continue sliding scale insulin with CBG goal 140-180  Left temporal scalp laceration s/p staple 2/2 fall  Staple removal  Severe protein calorie malnutrition Continue dietary supplements  Best Practice (right click and "Reselect all SmartList Selections" daily)   Diet/type: tubefeeds and NPO DVT prophylaxis: not  indicated GI prophylaxis: PPI Lines: HD catheter Foley: Discontinue Code Status: DNR Last date of multidisciplinary goals of care discussion: 09/23/22, palliative care is following  Labs   CBC: Recent Labs  Lab 09/03/22 1042 09/04/22 0333 09/06/22 0524 09/06/22 1504 09/07/22 0530 09/07/22 1517 09/08/22 0438 09-23-22 0612  WBC 13.9*   < > 20.2* 21.3*  --  19.3* 22.8* 19.2*  NEUTROABS 11.6*  --   --   --   --   --   --   --   HGB 8.0*   < > 8.6* 8.3* 9.5* 8.3* 8.4* 8.7*  HCT 23.8*   < > 25.2* 24.1* 28.0* 24.2* 24.4* 25.1*  MCV 94.8   < > 92.3 92.0  --  93.1 92.8 91.9  PLT 61*   < > 14* 22*  --  16* 20* 23*   < > = values in this interval not displayed.    Basic Metabolic Panel: Recent Labs  Lab 09/06/22 0524 09/07/22 0530 09/07/22 0558 09/07/22 0559 09/07/22 1517 09/08/22 0438 09/08/22 1557 September 23, 2022 0612 09/23/2022 0614  NA 137  135 138 137  --   --  135 135  --  134*  K 4.0  3.9 4.2 4.1  --   --  4.3 4.1  --  4.5  CL 104  102  --  104  --   --  97*  100  --  100  CO2 19*  20*  --  23  --   --  25 26  --  22  GLUCOSE 150*  150*  --  256*  --   --  215* 153*  --  167*  BUN 46*  46*  --  72*  --   --  51* 35*  --  31*  CREATININE 2.31*  2.29*  --  3.67*  --   --  2.72* 1.77*  --  1.44*  CALCIUM 7.7*  7.5*  --  7.7*  --   --  7.8* 7.6*  --  7.7*  MG 2.3  --   --  2.4 2.5* 2.0 2.1 2.1  --   PHOS 2.5  --  3.1  --  3.2 2.9 1.9*  1.8*  --  3.3   GFR: Estimated Creatinine Clearance: 47.1 mL/min (A) (by C-G formula based on SCr of 1.44 mg/dL (H)). Recent Labs  Lab 09/03/22 1037 09/03/22 1042 09/03/22 1555 09/03/22 1839 09/03/22 2255 09/04/22 0333 09/05/22 0309 09/06/22 1504 09/07/22 1517 09/08/22 0438 2022/09/13 0612  PROCALCITON  --   --  0.90  --   --   --   --   --   --   --   --   WBC  --    < >  --   --   --  16.4*   < > 21.3* 19.3* 22.8* 19.2*  LATICACIDVEN >9.0*  --   --  5.7* 3.9* 2.1*  --   --   --   --   --    < > = values in this interval not  displayed.    Liver Function Tests: Recent Labs  Lab 09/03/22 1042 09/03/22 1555 09/04/22 0333 09/04/22 1730 09/05/22 0603 09/05/22 1600 09/06/22 0524 09/07/22 0558 09/08/22 1557 09-13-2022 0612 09-13-22 0614  AST 2,030*  --  859*  --  354*  --  157*  --   --  33  --   ALT 1,576*  --  1,027*  --  669*  --  480*  --   --  160*  --   ALKPHOS 113  --  93  --  94  --  97  --   --  91  --   BILITOT 3.4*  --  4.0*  --  4.8*  --  7.5*  --   --  8.6*  --   PROT 5.5*  --  4.7*  --  4.5*  --  5.3*  --   --  5.3*  --   ALBUMIN 2.6*   < > 2.4*  2.3*   < > 2.2*   < > 2.4*  2.3* 2.0* 2.0* 1.9* 1.9*   < > = values in this interval not displayed.   Recent Labs  Lab 09/03/22 1042  LIPASE 81*   Recent Labs  Lab 09/03/22 1555 09/07/22 2358  AMMONIA 89* 23    ABG    Component Value Date/Time   PHART 7.384 09/07/2022 0530   PCO2ART 34.4 09/07/2022 0530   PO2ART 65 (L) 09/07/2022 0530   HCO3 20.6 09/07/2022 0530   TCO2 22 09/07/2022 0530   ACIDBASEDEF 4.0 (H) 09/07/2022 0530   O2SAT 93 09/07/2022 0530     Coagulation Profile: Recent Labs  Lab 09/03/22 0904 09/03/22 1042 09/04/22 0333 09/06/22 0524  INR 6.8* 6.8* 4.0*  3.9* 2.7*    Cardiac Enzymes: No results for input(s): "CKTOTAL", "CKMB", "  CKMBINDEX", "TROPONINI" in the last 168 hours.  HbA1C: Hgb A1c MFr Bld  Date/Time Value Ref Range Status  05/16/2021 11:47 AM 6.8 (H) 4.6 - 6.5 % Final    Comment:    Glycemic Control Guidelines for People with Diabetes:Non Diabetic:  <6%Goal of Therapy: <7%Additional Action Suggested:  >8%   03/14/2020 10:16 AM 5.9 (H) 4.8 - 5.6 % Final    Comment:    (NOTE) Pre diabetes:          5.7%-6.4%  Diabetes:              >6.4%  Glycemic control for   <7.0% adults with diabetes     CBG: Recent Labs  Lab 09/08/22 1534 09/08/22 1950 09/08/22 2323 09-26-2022 0410 September 26, 2022 0742  GLUCAP 131* 135* 181* 161* 156*   This patient is critically ill with multiple organ system  failure which requires frequent high complexity decision making, assessment, support, evaluation, and titration of therapies. This was completed through the application of advanced monitoring technologies and extensive interpretation of multiple databases.  During this encounter critical care time was devoted to patient care services described in this note for 37 minutes.    [name], MD [facility] See Amion for pager If no response to pager, please call [phone] until 7pm After 7pm, Please call E-link [phone]

## 2022-09-20 NOTE — Progress Notes (Signed)
   Palliative Medicine Inpatient Follow Up Note HPI: Patient is a 72 year old male with a past medical history of A-fib on Xarelto, CHF, CKD stage IV, diabetes mellitus type 2, alcoholism, and prior tobacco use who was admitted on 09/13/2022 for management after unwitnessed fall. Patient found to be altered upon admission. During hospitalization, patient required transfer to ICU for management of septic shock due to pneumonia requiring pressor support as well as AKI on CKD requiring CRRT. Patient also had repeat echo which is showing EF now 25-25%. Nephrology and cardiology following along with patient's course. Medicine team consulted to assist with complex medical decision making.    Today's Discussion 09-14-22  *Please note that this is a verbal dictation therefore any spelling or grammatical errors are due to the "[employer]" system interpretation.  Chart reviewed inclusive of vital signs, progress notes, laboratory results, and diagnostic images.   I met with Douglas Ballard at bedside this morning. He is more alert and able to tell me his name though continues to stare off intermittently and has very low phonation tone. Per his sister who is present at bedside he has remained confused though less so than the day prior.   We sat for a long while discussing the type of man Douglas Ballard is. She shared pictures with me and reflected upon his life. She shares that he has suffered terribly with alcoholism and tried many times to get help but it was never a habit he could quite.   Discussed Douglas Ballard love of his family and dogs.   Reviewed the plan to continue present care.  Questions and concerns addressed/Palliative Support Provided.   Objective Assessment: Vital Signs Vitals:   2022-09-14 0800 2022/09/14 0812  BP: 101/70 101/70  Pulse: 98 64  Resp: (!) 22 (!) 25  Temp:    SpO2: 97% 98%    Intake/Output Summary (Last 24 hours) at Sep 14, 2022 0957 Last data filed at 14-Sep-2022 0900 Gross per 24  hour  Intake 1160.13 ml  Output 4380 ml  Net -3219.87 ml    Last Weight  Most recent update: 09-14-2022  4:43 AM    Weight  74.9 kg (165 lb 2 oz)            Gen:  Frail elderly caucasian M chronically ill appearing HEENT: [place], Dry mucous membranes CV: Regular rate and rhythm  PULM:  On 50LPM HFNC  ABD: soft/nontender  EXT: BUE edema Neuro: Opens eyes and responds to name though is disoriented  SUMMARY OF RECOMMENDATIONS   DNAR/DNI  Continue current care - allowing time for outcomes  On 1/20 discussed the idea of comfort measures if patient worsens  PMT will continue to follow and offer support  Billing based on MDM: High ______________________________________________________________________________________ Douglas Ballard Team Team Cell Phone: [phone] Please utilize secure chat with additional questions, if there is no response within 30 minutes please call the above phone number  Palliative Medicine Team providers are available by phone from 7am to 7pm daily and can be reached through the team cell phone.  Should this patient require assistance outside of these hours, please call the patient's attending physician.

## 2022-09-20 NOTE — Progress Notes (Signed)
   12-Sep-2022 1300  Spiritual Encounters  Type of Visit Initial  Care provided to: Family  Referral source Family  Reason for visit End-of-life  OnCall Visit Yes  Spiritual Framework  Community/Connection Family  Family Stress Factors Loss  Interventions  Spiritual Care Interventions Made Normalization of emotions  Spiritual Care Plan  Spiritual Care Issues Still Outstanding No further spiritual care needs at this time (see row info)   Family members are grieving the loss. Encourage sharing of feelings through story telling. [place] prayed and provided compassionate touch. No further follow-up needed at this time.

## 2022-09-20 NOTE — Progress Notes (Signed)
[facility] Progress Note   Assessment/ Plan:   Assessment/ Plan: AKI on CKD4 - b/l creatinine 3.7- 4.0 from sept - dec 2023, eGFR 15- 67m/min. Creat here 5.1 on admission in setting of AMS, hyponatremia, etoh w/d. Started CRRT 1/15-1/18.  Transition to IHD 1/19--> with decline in respiratory status and mentation despite IHD- switch back to CRRT 1/20.  I think that if no improvements in the next 24 hrs comfort a reasonable approach Acute hypoxic RF: volume + possible aspiration, on unasyn Hyponatremia - resolved  Shock: off pressors DIC: per primary, platelets persistently low Hypocalcemia - aggressive replacement Etoh abuse - per pmd AMS - multifactorial due to low Na, etoh w/d, uremia HFrEF - echo showing LVEF 25-30% H/o CVA Dispo: MSOF- DNR now.     Subjective:    Seen in room.  Looks a little better.     Objective:   BP 101/70   Pulse 64   Temp (!) 96.8 F (36 C) (Axillary)   Resp (!) 25   Ht '5\' 9"'$  (1.753 m)   Wt 74.9 kg   SpO2 98%   BMI 24.38 kg/m   Intake/Output Summary (Last 24 hours) at 101/25/241034 Last data filed at 101/25/20241000 Gross per 24 hour  Intake 1140.13 ml  Output 4389 ml  Net -3248.87 ml   Weight change: -4.7 kg  Physical Exam: GKDT:OIZTI delirious CVS: tachy Resp:tachypneic, coarse breath sounds throughout Abd: + fluid wave Ext: 2+ anasarca, slightly improved ACCESS: R IJ nontunneled HD cath  Imaging: DG Abd Portable 1V  Result Date: 09/07/2022 CLINICAL DATA:  Feeding tube placement EXAM: PORTABLE ABDOMEN - 1 VIEW COMPARISON:  09/03/2022 FINDINGS: Feeding tube terminates at the distal gastric body to antrum. Incompletely imaged central line. Bibasilar airspace disease with small left pleural effusion. No gross free intraperitoneal air. IMPRESSION: Feeding tube terminating at the distal body to antrum. Electronically Signed   By: KAbigail MiyamotoM.D.   On: 09/07/2022 11:44    Labs: BMET Recent Labs  Lab 09/05/22 0309  09/05/22 1600 09/06/22 0524 09/07/22 0530 09/07/22 0558 09/07/22 1517 09/08/22 0438 09/08/22 1557 001/25/240614  NA 136 135 137  135 138 137  --  135 135 134*  K 3.6 3.8 4.0  3.9 4.2 4.1  --  4.3 4.1 4.5  CL 103 103 104  102  --  104  --  97* 100 100  CO2 21* 23 19*  20*  --  23  --  '25 26 22  '$ GLUCOSE 152* 135* 150*  150*  --  256*  --  215* 153* 167*  BUN 38* 33* 46*  46*  --  72*  --  51* 35* 31*  CREATININE 1.98* 1.56* 2.31*  2.29*  --  3.67*  --  2.72* 1.77* 1.44*  CALCIUM 7.1* 7.3* 7.7*  7.5*  --  7.7*  --  7.8* 7.6* 7.7*  PHOS 2.6 2.4* 2.5  --  3.1 3.2 2.9 1.9*  1.8* 3.3   CBC Recent Labs  Lab 09/03/22 1042 09/04/22 0333 09/06/22 1504 09/07/22 0530 09/07/22 1517 09/08/22 0438 [id number]  WBC 13.9*   < > 21.3*  --  19.3* 22.8* 19.2*  NEUTROABS 11.6*  --   --   --   --   --   --   HGB 8.0*   < > 8.3* 9.5* 8.3* 8.4* 8.7*  HCT 23.8*   < > 24.1* 28.0* 24.2* 24.4* 25.1*  MCV 94.8   < >  92.0  --  93.1 92.8 91.9  PLT 61*   < > 22*  --  16* 20* 23*   < > = values in this interval not displayed.    Medications:     carvedilol  25 mg Per Tube BID WC   Chlorhexidine Gluconate Cloth  6 each Topical Daily   feeding supplement (PROSource TF20)  60 mL Per Tube Daily   feeding supplement (VITAL 1.5 CAL)  1,000 mL Per Tube U98J   folic acid  1 mg Per Tube Daily   insulin aspart  2-6 Units Subcutaneous Q4H   insulin glargine-yfgn  12 Units Subcutaneous Daily   ipratropium-albuterol  3 mL Nebulization TID   multivitamin  1 tablet Per Tube QHS   mouth rinse  15 mL Mouth Rinse 4 times per day   pantoprazole (PROTONIX) IV  40 mg Intravenous Q12H   sodium chloride flush  10-40 mL Intracatheter Q12H    Madelon Lips MD 10-09-2022, 10:34 AM

## 2022-09-20 NOTE — Progress Notes (Signed)
1435:  Asystole noted on bedside EKG.  No heart sounds auscultated by this RN and Lindwood Coke RN

## 2022-09-20 NOTE — Death Summary Note (Signed)
DEATH SUMMARY   Patient Details  Name: Douglas Ballard MRN: [id number] DOB: 12-Aug-1951  Admission/Discharge Information   Admit Date:  09-04-2022  Date of Death: Date of Death: September 14, 2022  Time of Death: Time of Death: 9  Length of Stay: November 02, 2022  Referring Physician: Vivi Barrack, MD   Reason(s) for Hospitalization  Severe sepsis due to aspiration pneumonia  Acute toxic/septic encephalopathy ETOH withdrawal Uremia Acute Hypoxemic Respiratory Failure Aspiration PNA Small bilateral Pleural Effusions AKI on CKD stage IV, baseline creatinine 4.0 December 2023 Hypocalcemia Acute Systolic Heart Failure, 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 Paroxysmal Afib with RVR HTN Coagulopathy of liver disease Severe thrombocytopenia in the setting of liver disease and bone marrow suppression from sepsis Acute blood loss anemia on anemia of chronic disease illness Prolonged QTC  Left temporal scalp laceration s/p staple 2/2 fall  Severe protein calorie malnutrition  DM type II   Diagnoses  Preliminary cause of death: Withdrawal of care in the setting of multisystem organ failure Secondary Diagnoses (including complications and co-morbidities):  Active Problems:   Hyponatremia   Head injury   Need for emotional support   Counseling and coordination of care   Goals of care, counseling/discussion   Alcohol withdrawal syndrome without complication (HCC)   AKI (acute kidney injury) ([place])   Palliative care encounter   Malnutrition of moderate degree   Protein-calorie malnutrition, severe   Atrial fibrillation with RVR [facility])   [facility] Course (including significant findings, care, treatment, and services provided and events leading to death)  Douglas Ballard is a 72 y.o. year old male with prior hx as below, significant for Afib on Xarelto, HFpEF, CKD IV, and DMT2 presented from home by EMS after unwitnessed fall.  Patient reported to EMS, fell out of bed due to weakness while  trying to go the bathroom, denied LOC, and sustained a small laceration to the left temporal area.  Additionally complained of nausea and vomiting for one month in which he has not had evaluated.  Was alert and oriented initially and denied any neck or back pain.    In ER, patient found in afib with RVR, no temperature available yet, normoxia, and SBP 130-150's.  While in ER patient c/o of neck, back, and ankle pain then became more agitated, altered and tremulous with concern for ETOH withdrawal.  Given ativan '1mg'$  twice.  Additionally found to be in Afib with RVR, placed on cardizem gtt. Labs noted for Na 113, Cl 79, sCr 4.5, BUN/ sCr 64/ 5.11 (prior 54/ 3.26 04/2021), bicarb 15, AG 19, glucose 179, AST/ ALT 712/ 553, t. Bili 2.1, BNP 2970, trop hs 36, WBC 19.8, Hgb 10.3, Hct 28, plts 185, neg ETOH level, EKG afib with incomplete RBBB, rate 125, Qtc 579.  CTH and cervical neg for acute fx or intracranial process. CXR showed cardiomegaly with mild pulmonary venous congestion and possible early right perihilar opacity.  XR left/ right ankle neg, lumbar and pelvis neg for fx.  He has received 1L LR in ER.  PCCM called for admit.  Patient remains less talkative after ativan, remains tremulous, and confused.  Patient was continued on IV antibiotic therapy in the setting of aspiration pneumonia He remains on high flow nasal cannula oxygen and nonrebreather, his oxygen requirement continued to increase, with persistent pulmonary edema and AKI on CKD stage IV he was started on CRRT with volume removal but there was not much response in terms of his respiratory failure He remains encephalopathic, continues to receive  high-dose of thiamine.  DIC was ruled out he did have coagulopathy of liver disease, he was noted to have severe thrombocytopenia and acute blood loss anemia on anemia of chronic disease Due to multisystem organ failure, palliative care consult was requested, patient's family made him DNR, as patient was  not improving and is struggling with his own secretions and respiratory failure, patient's family decided to proceed with comfort care.  Patient was placed on comfort care and he passed on 09/12/2022 at 2:35 PM.  Patient's family was at bedside   Pertinent Labs and Studies  Significant Diagnostic Studies DG Abd Portable 1V  Result Date: 09/07/2022 CLINICAL DATA:  Feeding tube placement EXAM: PORTABLE ABDOMEN - 1 VIEW COMPARISON:  09/03/2022 FINDINGS: Feeding tube terminates at the distal gastric body to antrum. Incompletely imaged central line. Bibasilar airspace disease with small left pleural effusion. No gross free intraperitoneal air. IMPRESSION: Feeding tube terminating at the distal body to antrum. Electronically Signed   By: Abigail Miyamoto M.D.   On: 09/07/2022 11:44   DG CHEST PORT 1 VIEW  Result Date: 09/07/2022 CLINICAL DATA:  Acute respiratory failure EXAM: PORTABLE CHEST 1 VIEW COMPARISON:  Chest x-ray September 03, 2022 FINDINGS: The right approach central venous catheter terminates in the low SVC. The left approach central venous catheter tip crosses midline than ascends superiorly, likely within the right brachiocephalic vein. The NG tube side port is likely within the distal esophagus/proximal stomach, with the distal tip within the stomach. Unchanged cardiomegaly and mediastinal contours. Markedly increased heterogeneous pulmonary opacities involving bilateral lower lobes and the right upper lobe. Small left pleural effusion. No large pneumothorax. No acute osseous abnormality. The visualized upper abdomen is unremarkable. IMPRESSION: 1. Markedly increased heterogeneous pulmonary opacities involving bilateral lower lobes in the right upper lobe, concerning for multifocal infection and/or aspiration pneumonitis. 2. Small left pleural effusion. 3. Malpositioning of the left approach central venous catheter, with the tip likely within the right brachiocephalic vein. 4. The side port of the NG  tube is likely within the distal esophagus. Recommend slight advancement. Electronically Signed   By: Beryle Flock M.D.   On: 09/07/2022 09:12   DG CHEST PORT 1 VIEW  Result Date: 09/03/2022 CLINICAL DATA:  Central line placement. EXAM: PORTABLE CHEST 1 VIEW COMPARISON:  09/03/2022 FINDINGS: Right IJ central venous catheter has tip over the SVC. Left IJ venous catheter extends to the region of the SVC where tip then turns superior for 1.5 cm and may be within the azygous vein versus superiorly oriented within the SVC. NG tube has tip over the stomach in the left upper quadrant. There is hazy infrahilar/bibasilar opacification without significant change which may be due to interstitial edema versus infection. Possible small amount left pleural fluid unchanged. No pneumothorax. Cardiomediastinal silhouette and remainder of the exam is unchanged. IMPRESSION: 1. Hazy infrahilar/bibasilar opacification without significant change which may be due to interstitial edema versus infection. Possible small amount left pleural fluid unchanged. 2. Tubes and lines as described. Electronically Signed   By: Marin Olp M.D.   On: 09/03/2022 15:24   DG Abd 1 View  Addendum Date: 09/03/2022   ADDENDUM REPORT: 09/03/2022 12:23 ADDENDUM: Critical Value/emergent results were called by telephone at the time of interpretation on 09/03/2022 at 12:23 pm to provider [facility] , who verbally acknowledged these results. Electronically Signed   By: Misty Stanley M.D.   On: 09/03/2022 12:23   Result Date: 09/03/2022 CLINICAL DATA:  NG tube placement. EXAM: ABDOMEN -  1 VIEW COMPARISON:  Earlier same day. FINDINGS: 1206 hours. The NG tube tip follows a course consistent with placement in the left mainstem bronchus. There is no evidence of hiatal hernia on CT scan performed earlier today to account for the course of the NG tube. Right IJ central line again noted. Basilar airspace disease with left effusion noted in the lower  chest. IMPRESSION: NG tube tip follows a course consistent with placement in the left mainstem bronchus. Tube should be removed and replaced. Electronically Signed: By: Misty Stanley M.D. On: 09/03/2022 12:19   DG CHEST PORT 1 VIEW  Result Date: 09/03/2022 CLINICAL DATA:  Central line placement EXAM: PORTABLE CHEST 1 VIEW COMPARISON:  09/03/2022, 8:24 a.m. FINDINGS: Interval placement of right neck multi lumen vascular catheter, tip near the superior cavoatrial junction. Otherwise unchanged AP portable chest radiograph featuring cardiomegaly, diffuse bilateral interstitial pulmonary opacity, and probable layering left pleural effusion. No focal airspace opacity. IMPRESSION: 1. Interval placement of right neck multi lumen vascular catheter, tip near the superior cavoatrial junction. 2. Otherwise unchanged AP portable chest radiograph featuring cardiomegaly, diffuse bilateral interstitial pulmonary opacity, and probable layering left pleural effusion. No new or focal airspace opacity. Electronically Signed   By: Delanna Ahmadi M.D.   On: 09/03/2022 12:01   CT ABDOMEN PELVIS WO CONTRAST  Result Date: 09/03/2022 CLINICAL DATA:  Encephalopathic.  Abdominal pain.  Fall. EXAM: CT ABDOMEN AND PELVIS WITHOUT CONTRAST TECHNIQUE: Multidetector CT imaging of the abdomen and pelvis was performed following the standard protocol without IV contrast. RADIATION DOSE REDUCTION: This exam was performed according to the departmental dose-optimization program which includes automated exposure control, adjustment of the mA and/or kV according to patient size and/or use of iterative reconstruction technique. COMPARISON:  None FINDINGS: Lower chest: Patchy foci of ground-glass density within the RIGHT middle lobe and RIGHT lower lobe. Bilateral small effusions. Hepatobiliary: No focal hepatic lesion on noncontrast exam. Gallbladder normal. Pancreas: Pancreas is normal. No ductal dilatation. No pancreatic inflammation. Spleen: Normal  spleen Adrenals/urinary tract: Adrenal glands and kidneys normal. Foley catheter and gas within bladder Stomach/Bowel: Stomach is distended with fluid. Small bowel is normal without evidence distension. The colon and rectosigmoid colon are normal. Vascular/Lymphatic: Abdominal aorta is normal caliber with atherosclerotic calcification. There is no retroperitoneal or periportal lymphadenopathy. No pelvic lymphadenopathy. Reproductive: Post prostatectomy. Other: No free fluid. Musculoskeletal: No aggressive osseous lesion. Anasarca subcutaneous tissues of the flank. IMPRESSION: 1. No evidence of trauma in the abdomen pelvis. 2. Patchy ground-glass density in the RIGHT middle lobe and RIGHT lower lobe could represent pulmonary infection or edema. 3. Small bilateral effusions. 4. Stomach distended with fluid.  No evidence of bowel obstruction. 5. Foley catheter in place. 6. Subcutaneous anasarca 7.  Aortic Atherosclerosis (ICD10-I70.0). Electronically Signed   By: Suzy Bouchard M.D.   On: 09/03/2022 10:39   DG CHEST PORT 1 VIEW  Result Date: 09/03/2022 CLINICAL DATA:  Altered mental status. History of atrial fibrillation, hypertension and pneumonia. EXAM: PORTABLE CHEST 1 VIEW COMPARISON:  09/05/2022; 03/14/2020 FINDINGS: Grossly unchanged enlarged cardiac silhouette and mediastinal contours with atherosclerotic plaque within the thoracic aorta. Worsening slightly nodular airspace opacities within the peripheral aspect of the right mid lung. Worsening bilateral infrahilar opacities. Trace bilateral effusions are not excluded. No pneumothorax. No acute osseous abnormalities. Old left seventh rib fracture. IMPRESSION: Worsening right mid lung and bilateral infrahilar heterogeneous opacities, potentially atelectasis though multifocal infection, including atypical etiologies, could have a similar appearance. Further evaluation with a PA and lateral  chest radiograph may be obtained as clinically indicated.  Electronically Signed   By: Sandi Mariscal M.D.   On: 09/03/2022 08:48   CT HEAD WO CONTRAST (5MM)  Result Date: 09/03/2022 CLINICAL DATA:  Mental status change, unknown cause EXAM: CT HEAD WITHOUT CONTRAST TECHNIQUE: Contiguous axial images were obtained from the base of the skull through the vertex without intravenous contrast. RADIATION DOSE REDUCTION: This exam was performed according to the departmental dose-optimization program which includes automated exposure control, adjustment of the mA and/or kV according to patient size and/or use of iterative reconstruction technique. COMPARISON:  CT head 08/23/2022 FINDINGS: Brain: Patchy and confluent areas of decreased attenuation are noted throughout the deep and periventricular white matter of the cerebral hemispheres bilaterally, compatible with chronic microvascular ischemic disease. No evidence of large-territorial acute infarction. No parenchymal hemorrhage. No mass lesion. No extra-axial collection. No mass effect or midline shift. No hydrocephalus. Basilar cisterns are patent. Vascular: No hyperdense vessel. Skull: No acute fracture or focal lesion. Sinuses/Orbits: Paranasal sinuses and mastoid air cells are clear. The orbits are unremarkable. Other: None. IMPRESSION: No acute intracranial abnormality. Electronically Signed   By: Iven Finn M.D.   On: 09/03/2022 01:23   US Abdomen Limited RUQ (LIVER/GB)  Result Date: 08/26/2022 CLINICAL DATA:  Transaminitis. EXAM: ULTRASOUND ABDOMEN LIMITED RIGHT UPPER QUADRANT COMPARISON:  Renal ultrasound dated 09/05/2022. Abdomen and pelvis CT dated 02/15/2016. FINDINGS: Gallbladder: No gallstones or wall thickening visualized. No sonographic Murphy sign noted by sonographer. Common bile duct: Diameter: 4.2 mm Liver: No focal lesion identified. Within normal limits in parenchymal echogenicity. Portal vein is patent on color Doppler imaging with normal direction of blood flow towards the liver. Other: None.  IMPRESSION: Normal examination. Electronically Signed   By: Claudie Revering M.D.   On: 08/20/2022 17:41   ECHOCARDIOGRAM COMPLETE  Result Date: 08/28/2022    ECHOCARDIOGRAM REPORT   Patient Name:   NICHOLI GHUMAN Date of Exam: 08/23/2022 Medical Rec #:  [id number]      Height:       69.0 in Accession #:    [id number]     Weight:       160.0 lb Date of Birth:  Nov 29, 1950     BSA:          1.879 m Patient Age:    59 years       BP:           142/100 mmHg Patient Gender: M              HR:           111 bpm. Exam Location:  Inpatient Procedure: Intracardiac Opacification Agent Indications:    atrial fibrillation  History:        Patient has prior history of Echocardiogram examinations, most                 recent 03/14/2020. Arrythmias:Atrial Fibrillation; Risk                 Factors:Diabetes and Hypertension.  Sonographer:    Harvie Junior Referring Phys: [phone] PAULA B SIMPSON  Sonographer Comments: Technically difficult study due to poor echo windows. IMPRESSIONS  1. Left ventricular ejection fraction, by estimation, is 20 to 25%. The left ventricle has severely decreased function. The left ventricle demonstrates global hypokinesis. The left ventricular internal cavity size was mildly dilated. There is mild left ventricular hypertrophy. Left ventricular diastolic function could not be evaluated. Elevated left atrial pressure.  2. Right ventricular systolic  function is mildly reduced. The right ventricular size is moderately enlarged. There is mildly elevated pulmonary artery systolic pressure.  3. Left atrial size was severely dilated.  4. Right atrial size was severely dilated.  5. The mitral valve is normal in structure. Mild mitral valve regurgitation. No evidence of mitral stenosis.  6. Tricuspid valve regurgitation is mild to moderate.  7. The aortic valve is tricuspid. Aortic valve regurgitation is trivial. No aortic stenosis is present.  8. The inferior vena cava is normal in size with greater than 50%  respiratory variability, suggesting right atrial pressure of 3 mmHg. FINDINGS  Left Ventricle: Left ventricular ejection fraction, by estimation, is 20 to 25%. The left ventricle has severely decreased function. The left ventricle demonstrates global hypokinesis. Definity contrast agent was given IV to delineate the left ventricular endocardial borders. The left ventricular internal cavity size was mildly dilated. There is mild left ventricular hypertrophy. Left ventricular diastolic function could not be evaluated due to atrial fibrillation. Left ventricular diastolic function could not be evaluated. Elevated left atrial pressure. Right Ventricle: The right ventricular size is moderately enlarged. Right ventricular systolic function is mildly reduced. There is mildly elevated pulmonary artery systolic pressure. The tricuspid regurgitant velocity is 3.05 m/s, and with an assumed right atrial pressure of 3 mmHg, the estimated right ventricular systolic pressure is 19.4 mmHg. Left Atrium: Left atrial size was severely dilated. Right Atrium: Right atrial size was severely dilated. Pericardium: Trivial pericardial effusion is present. Mitral Valve: The mitral valve is normal in structure. Mild mitral annular calcification. Mild mitral valve regurgitation. No evidence of mitral valve stenosis. Tricuspid Valve: The tricuspid valve is normal in structure. Tricuspid valve regurgitation is mild to moderate. No evidence of tricuspid stenosis. Aortic Valve: The aortic valve is tricuspid. Aortic valve regurgitation is trivial. Aortic regurgitation PHT measures 394 msec. No aortic stenosis is present. Aortic valve mean gradient measures 2.6 mmHg. Aortic valve peak gradient measures 4.8 mmHg. Aortic valve area, by VTI measures 2.14 cm. Pulmonic Valve: The pulmonic valve was normal in structure. Pulmonic valve regurgitation is trivial. No evidence of pulmonic stenosis. Aorta: The aortic root is normal in size and structure.  Venous: The inferior vena cava is normal in size with greater than 50% respiratory variability, suggesting right atrial pressure of 3 mmHg. IAS/Shunts: No atrial level shunt detected by color flow Doppler.  LEFT VENTRICLE PLAX 2D LVIDd:         5.40 cm      Diastology LVIDs:         4.50 cm      LV e' medial:    4.21 cm/s LV PW:         1.30 cm      LV E/e' medial:  24.3 LV IVS:        1.20 cm      LV e' lateral:   7.98 cm/s LVOT diam:     2.00 cm      LV E/e' lateral: 12.8 LV SV:         33 LV SV Index:   17 LVOT Area:     3.14 cm  LV Volumes (MOD) LV vol d, MOD A2C: 159.5 ml LV vol d, MOD A4C: 178.5 ml LV vol s, MOD A2C: 101.5 ml LV vol s, MOD A4C: 129.0 ml LV SV MOD A2C:     58.0 ml LV SV MOD A4C:     178.5 ml LV SV MOD BP:      55.7  ml RIGHT VENTRICLE RV Basal diam:  5.30 cm RV Mid diam:    4.50 cm RV S prime:     9.84 cm/s TAPSE (M-mode): 1.2 cm LEFT ATRIUM             Index        RIGHT ATRIUM           Index LA diam:        4.80 cm 2.55 cm/m   RA Area:     28.85 cm LA Vol (A2C):   69.5 ml 36.99 ml/m  RA Volume:   111.50 ml 59.34 ml/m LA Vol (A4C):   49.9 ml 26.56 ml/m LA Biplane Vol: 60.8 ml 32.36 ml/m  AORTIC VALVE                    PULMONIC VALVE AV Area (Vmax):    2.47 cm     PV Vmax:          0.74 m/s AV Area (Vmean):   2.22 cm     PV Peak grad:     2.2 mmHg AV Area (VTI):     2.14 cm     PR End Diast Vel: 8.64 msec AV Vmax:           109.94 cm/s AV Vmean:          75.260 cm/s AV VTI:            0.152 m AV Peak Grad:      4.8 mmHg AV Mean Grad:      2.6 mmHg LVOT Vmax:         86.27 cm/s LVOT Vmean:        53.133 cm/s LVOT VTI:          0.104 m LVOT/AV VTI ratio: 0.68 AI PHT:            394 msec  AORTA Ao Root diam: 3.10 cm Ao Asc diam:  3.10 cm MITRAL VALVE                TRICUSPID VALVE MV Area (PHT): 5.26 cm     TR Peak grad:   37.2 mmHg MV Decel Time: 144 msec     TR Vmax:        305.00 cm/s MR Peak grad: 76.6 mmHg MR Vmax:      437.60 cm/s   SHUNTS MV E velocity: 102.50 cm/s  Systemic  VTI:  0.10 m                             Systemic Diam: 2.00 cm Kirk Ruths MD Electronically signed by Kirk Ruths MD Signature Date/Time: 08/22/2022/5:07:43 PM    Final    US RENAL  Result Date: 08/24/2022 CLINICAL DATA:  Acute kidney injury. EXAM: RENAL / URINARY TRACT ULTRASOUND COMPLETE COMPARISON:  Eight twenty six two thousand fifteen FINDINGS: Right Kidney: Renal measurements: 11.7 x 6.5 x 4.5 cm = volume: 176 mL. Echogenicity within normal limits. No mass or hydronephrosis visualized. Left Kidney: Renal measurements: 10.2 x 5.2 x 3.8 cm = volume: 104 mL. Echogenicity within normal limits. No mass or hydronephrosis visualized. Bladder: Appears normal for degree of bladder distention. Other: None. IMPRESSION: Normal examination. Electronically Signed   By: Claudie Revering M.D.   On: 08/25/2022 14:02   CT Cervical Spine Wo Contrast  Result Date: 08/27/2022 CLINICAL DATA:  Trauma EXAM: CT CERVICAL SPINE WITHOUT CONTRAST TECHNIQUE: Multidetector  CT imaging of the cervical spine was performed without intravenous contrast. Multiplanar CT image reconstructions were also generated. RADIATION DOSE REDUCTION: This exam was performed according to the departmental dose-optimization program which includes automated exposure control, adjustment of the mA and/or kV according to patient size and/or use of iterative reconstruction technique. COMPARISON:  None Available. FINDINGS: Alignment: Alignment of posterior margins of vertebral bodies is unremarkable. There is minimal levoscoliosis. Skull base and vertebrae: No recent fracture is seen. Degenerative changes are noted. Soft tissues and spinal canal: Posterior bony spurs are causing extrinsic pressure over the ventral margin of thecal sac at C5-C6 and C6-C7 levels with spinal stenosis. Disc levels: There is encroachment of neural foramina from C2 to C7 levels. Upper chest: Motion artifacts limit evaluation. Other: Unremarkable. IMPRESSION: No recent fracture is  seen in cervical spine. Cervical spondylosis with encroachment of neural foramina and spinal stenosis at multiple levels, more so at C5-C6 and C6-C7 levels. Electronically Signed   By: Elmer Picker M.D.   On: 09/19/2022 10:09   CT Head Wo Contrast  Result Date: 08/20/2022 CLINICAL DATA:  Trauma EXAM: CT HEAD WITHOUT CONTRAST TECHNIQUE: Contiguous axial images were obtained from the base of the skull through the vertex without intravenous contrast. RADIATION DOSE REDUCTION: This exam was performed according to the departmental dose-optimization program which includes automated exposure control, adjustment of the mA and/or kV according to patient size and/or use of iterative reconstruction technique. COMPARISON:  04/19/2017 FINDINGS: Brain: No acute intracranial findings are seen in noncontrast CT brain. There are no signs of bleeding within the cranium. Ventricles are not dilated. There is no shift of midline structures. There is decreased density in periventricular region in left frontal lobe, possibly small-vessel disease or old infarct with no significant interval change. Vascular: Unremarkable. Skull: No fracture is seen in calvarium. There are small pockets of air in subcutaneous plane in the left frontotemporal region. Skin staple is seen in the left frontal region. Sinuses/Orbits: There is mild mucosal thickening in maxillary sinuses. There are no air-fluid levels. Other: None. IMPRESSION: No acute intracranial findings are seen. No fracture is seen in calvarium. There are few tiny pockets of air in subcutaneous plane in the left frontotemporal region, possibly related to skin laceration. Mild chronic sinusitis. Electronically Signed   By: Elmer Picker M.D.   On: 09/14/2022 10:05   DG Ankle Complete Left  Result Date: 08/28/2022 CLINICAL DATA:  Fall. EXAM: LEFT ANKLE COMPLETE - 3+ VIEW COMPARISON:  None Available. FINDINGS: There is no evidence of fracture, dislocation, or joint effusion.  There is no evidence of arthropathy or other focal bone abnormality. Soft tissues are unremarkable. IMPRESSION: Negative. Electronically Signed   By: Marijo Conception M.D.   On: 08/20/2022 09:45   DG Lumbar Spine Complete  Result Date: 09/06/2022 CLINICAL DATA:  Fall. EXAM: LUMBAR SPINE - COMPLETE 4+ VIEW COMPARISON:  None Available. FINDINGS: There is no evidence of lumbar spine fracture. Alignment is normal. Moderate degenerative disc disease is seen at L5-S1 with anterior osteophyte formation. IMPRESSION: Moderate degenerative disc disease at L5-S1. No acute abnormality seen. Aortic Atherosclerosis (ICD10-I70.0). Electronically Signed   By: Marijo Conception M.D.   On: 08/23/2022 09:43   DG Pelvis 1-2 Views  Result Date: 09/16/2022 CLINICAL DATA:  Fall. EXAM: PELVIS - 1-2 VIEW COMPARISON:  None Available. FINDINGS: There is no evidence of pelvic fracture or diastasis. No pelvic bone lesions are seen. IMPRESSION: Negative. Electronically Signed   By: Bobbe Medico.D.  On: 09/10/2022 09:40   DG Ankle Complete Right  Result Date: 08/25/2022 CLINICAL DATA:  Right ankle pain after fall. EXAM: RIGHT ANKLE - COMPLETE 3+ VIEW COMPARISON:  None Available. FINDINGS: There is no evidence of fracture, dislocation, or joint effusion. There is no evidence of arthropathy or other focal bone abnormality. Soft tissues are unremarkable. IMPRESSION: Negative. Electronically Signed   By: Marijo Conception M.D.   On: 09/01/2022 09:40   DG Chest Port 1 View  Result Date: 09/03/2022 CLINICAL DATA:  Fall. EXAM: PORTABLE CHEST 1 VIEW COMPARISON:  March 14, 2020. FINDINGS: Mild cardiomegaly is noted with mild central pulmonary vascular congestion. No consolidative process is noted. The visualized skeletal structures are unremarkable. IMPRESSION: Mild cardiomegaly with mild central pulmonary vascular congestion. Electronically Signed   By: Marijo Conception M.D.   On: 09/03/2022 09:38    Microbiology No results found for  this or any previous visit (from the past 240 hour(s)).  Lab Basic Metabolic Panel: Recent Labs  Lab 09/07/22 1517 09/08/22 0438 09/08/22 1557 09/11/2022 0612 09/11/2022 0614  NA  --  135 135  --  134*  K  --  4.3 4.1  --  4.5  CL  --  97* 100  --  100  CO2  --  25 26  --  22  GLUCOSE  --  215* 153*  --  167*  BUN  --  51* 35*  --  31*  CREATININE  --  2.72* 1.77*  --  1.44*  CALCIUM  --  7.8* 7.6*  --  7.7*  MG 2.5* 2.0 2.1 2.1  --   PHOS 3.2 2.9 1.9*  1.8*  --  3.3   Liver Function Tests: Recent Labs  Lab 09/08/22 1557 09/11/22 0612 Sep 11, 2022 0614  AST  --  33  --   ALT  --  160*  --   ALKPHOS  --  91  --   BILITOT  --  8.6*  --   PROT  --  5.3*  --   ALBUMIN 2.0* 1.9* 1.9*   No results for input(s): "LIPASE", "AMYLASE" in the last 168 hours. Recent Labs  Lab 09/07/22 2358  AMMONIA 23   CBC: Recent Labs  Lab 09/07/22 1517 09/08/22 0438 09-11-22 0612  WBC 19.3* 22.8* 19.2*  HGB 8.3* 8.4* 8.7*  HCT 24.2* 24.4* 25.1*  MCV 93.1 92.8 91.9  PLT 16* 20* 23*   Cardiac Enzymes: No results for input(s): "CKTOTAL", "CKMB", "CKMBINDEX", "TROPONINI" in the last 168 hours. Sepsis Labs: Recent Labs  Lab 09/07/22 1517 09/08/22 0438 Sep 11, 2022 0612  WBC 19.3* 22.8* 19.2*    Procedures/Operations     Parnell Spieler 09/14/2022, 10:57 AM

## 2022-09-20 NOTE — Progress Notes (Signed)
   Palliative Medicine Inpatient Follow Up Note  I was asked to meet with patients spouse, [name] this afternoon.   I met in the [place] waiting area with [name], her daughter, and Deery brother and sister. [name] shares that she feels Perle has been through enough and she does not foresee things getting better. She expresses that she thought about it and would like to proceed with the plan to keep him comfortable.   We talked about transition to comfort measures in house and what that would entail inclusive of medications to control pain, dyspnea, & agitation. I shared that we would start a opioid and benzodiazepine gtt.  We discussed stopping CRRT and all invasive measures.  Reviewed it is unclear typically how long or short someone's time would be.   Patients family request chaplain support.  Orders entered.  Nursing staff and medical team informed.   SUMMARY OF RECOMMENDATIONS   DNAR/DNI  Comfort Measures  Start dilaudid and midazolam gtts  Stop CRRT  O2 Wean initiation  Spiritual support  PMT will continue to follow  Additional Time: 8 ______________________________________________________________________________________ [place] Team Team Cell Phone: [phone] Please utilize secure chat with additional questions, if there is no response within 30 minutes please call the above phone number  Palliative Medicine Team providers are available by phone from 7am to 7pm daily and can be reached through the team cell phone.  Should this patient require assistance outside of these hours, please call the patient's attending physician.

## 2022-09-20 DEATH — deceased
